# Patient Record
Sex: Female | Born: 1935
Health system: Southern US, Community
[De-identification: ages and names within clinical notes are randomized; demographics above are authoritative.]

## PROBLEM LIST (undated history)

## (undated) DIAGNOSIS — E039 Hypothyroidism, unspecified: Secondary | ICD-10-CM

## (undated) DIAGNOSIS — J189 Pneumonia, unspecified organism: Secondary | ICD-10-CM

## (undated) DIAGNOSIS — Z803 Family history of malignant neoplasm of breast: Secondary | ICD-10-CM

## (undated) DIAGNOSIS — Z8042 Family history of malignant neoplasm of prostate: Secondary | ICD-10-CM

## (undated) DIAGNOSIS — Z9289 Personal history of other medical treatment: Secondary | ICD-10-CM

## (undated) DIAGNOSIS — E669 Obesity, unspecified: Secondary | ICD-10-CM

## (undated) DIAGNOSIS — M1990 Unspecified osteoarthritis, unspecified site: Secondary | ICD-10-CM

## (undated) DIAGNOSIS — I10 Essential (primary) hypertension: Secondary | ICD-10-CM

## (undated) DIAGNOSIS — Z800 Family history of malignant neoplasm of digestive organs: Secondary | ICD-10-CM

## (undated) DIAGNOSIS — Z8051 Family history of malignant neoplasm of kidney: Secondary | ICD-10-CM

## (undated) DIAGNOSIS — E785 Hyperlipidemia, unspecified: Secondary | ICD-10-CM

## (undated) DIAGNOSIS — C250 Malignant neoplasm of head of pancreas: Secondary | ICD-10-CM

## (undated) HISTORY — PX: BREAST CYST EXCISION: SHX579

## (undated) HISTORY — DX: Unspecified osteoarthritis, unspecified site: M19.90

## (undated) HISTORY — DX: Family history of malignant neoplasm of prostate: Z80.42

## (undated) HISTORY — DX: Obesity, unspecified: E66.9

## (undated) HISTORY — PX: TONSILLECTOMY: SUR1361

## (undated) HISTORY — PX: INGUINAL HERNIA REPAIR: SUR1180

## (undated) HISTORY — DX: Family history of malignant neoplasm of digestive organs: Z80.0

## (undated) HISTORY — DX: Family history of malignant neoplasm of kidney: Z80.51

## (undated) HISTORY — DX: Family history of malignant neoplasm of breast: Z80.3

## (undated) HISTORY — PX: CYST EXCISION: SHX5701

## (undated) HISTORY — DX: Malignant neoplasm of head of pancreas: C25.0

## (undated) HISTORY — DX: Essential (primary) hypertension: I10

## (undated) HISTORY — PX: DILATION AND CURETTAGE OF UTERUS: SHX78

## (undated) HISTORY — DX: Hyperlipidemia, unspecified: E78.5

---

## 1977-10-11 DIAGNOSIS — Z9289 Personal history of other medical treatment: Secondary | ICD-10-CM

## 1977-10-11 HISTORY — DX: Personal history of other medical treatment: Z92.89

## 1977-10-11 HISTORY — PX: TOTAL ABDOMINAL HYSTERECTOMY: SHX209

## 1978-10-11 HISTORY — PX: REDUCTION MAMMAPLASTY: SUR839

## 2015-08-13 ENCOUNTER — Ambulatory Visit: Payer: PPO | Admitting: Podiatry

## 2015-09-03 ENCOUNTER — Ambulatory Visit: Payer: PPO | Admitting: Podiatry

## 2015-09-24 ENCOUNTER — Ambulatory Visit (INDEPENDENT_AMBULATORY_CARE_PROVIDER_SITE_OTHER): Payer: PPO | Admitting: Podiatry

## 2015-09-24 ENCOUNTER — Encounter: Payer: Self-pay | Admitting: Podiatry

## 2015-09-24 ENCOUNTER — Ambulatory Visit (INDEPENDENT_AMBULATORY_CARE_PROVIDER_SITE_OTHER): Payer: PPO

## 2015-09-24 VITALS — BP 131/72 | HR 76 | Resp 12

## 2015-09-24 DIAGNOSIS — M2040 Other hammer toe(s) (acquired), unspecified foot: Secondary | ICD-10-CM

## 2015-09-24 NOTE — Patient Instructions (Signed)
Hammer Toes Hammer toes is a condition in which one or more of your toes is permanently flexed. CAUSES  This happens when a muscle imbalance or abnormal bone length makes your small toes buckle. This causes the toe joint to contract and the strong cord-like bands that attach muscles to the bones (tendons) in your toes to shorten.  SIGNS AND SYMPTOMS  Common symptoms of flexible hammer toes include:   A buildup of skin cells (corns). Corns occur where boney bumps come in frequent contact with hard surfaces. For example, where your shoes press and rub.  Irritation.  Inflammation.  Pain.  Limited motion in your toes. DIAGNOSIS  Hammer toes are diagnosed through a physical exam of your toes. During the exam, your health care provider may try to reproduce your symptoms by manipulating your foot. Often, X-ray exams are done to determine the degree of deformity and to make sure that the cause is not a fracture.  TREATMENT  Hammer toes can be treated with corrective surgery. There are several types of surgical procedures that can treat hammer toes. The most common procedures include:  Arthroplasty--A portion of the joint is surgically removed and your toe is straightened. The gap fills in with fibrous tissue. This procedure helps treat pain and deformity and helps restore function.  Fusion--Cartilage between the two bones of the affected joint is taken out and the bones fuse together into one longer bone. This helps keep your toe stable and reduces pain but leaves your toe stiff, yet straight.  Implantation--A portion of your bone is removed and replaced with an implant to restore motion.  Flexor tendon transfers--This procedure repositions the tendons that curl the toes down (flexor tendons). This may be done to release the deforming force that causes your toe to buckle. Several of these procedures require fixing your toe with a pin that is visible at the tip of your toe. The pin keeps the toe  straight during healing. Your health care provider will remove the pin usually within 4-8 weeks after the procedure.    This information is not intended to replace advice given to you by your health care provider. Make sure you discuss any questions you have with your health care provider.   Document Released: 09/24/2000 Document Revised: 10/02/2013 Document Reviewed: 06/04/2013 Elsevier Interactive Patient Education 2016 [employer]  

## 2015-09-24 NOTE — Progress Notes (Signed)
   Subjective:    Patient ID: Lauren Richard, female    DOB: 1936/03/28, 79 y.o.   MRN: [id number]  HPI    This patient presents today primarily because of a painful second left toe further approximately 1 year aggravated with shoe wearing and walking. Patient has applied pads around the toe to relieve the symptoms. If patient wears a very loose shoe she can tolerate the shoes. The symptoms are progressively more uncomfortable in or around the second left toe at the level of the distal interphalangeal joint. Patient notices a area of firm tissue in that area that has not increased in size. Also patient is complaining of painful fifth toes/corns when wearing shoes and is applied padding around this area which does reduce some of the symptoms. She would like to have these areas evaluated as well    Review of Systems  Cardiovascular: Positive for leg swelling.       Objective:   Physical Exam  Orientated 3  Vascular: No peripheral edema bilaterally DP pulses 4/4 bilaterally DP pulses 2/4 bilaterally Capillary reflex immediate bilaterally  Neurological: Sensation to 10 g monofilament wire intact 5/5 bilaterally Vibratory sensation intact bilaterally Ankle reflex equal and reactive bilaterally  Dermatological: No open skin lesions bilaterally Corns lateral fifth toes bilaterally  Musculoskeletal: HAV deformities bilaterally Contracture of distal interphalangeal joint second left with palpable firm area on the lateral aspect of the distal interphalangeal joint left. Hammertoe second right Varus rotation toes 3-5 left Varus rotation fifth toe right   X-ray examination weightbearing right foot dated 09/24/2015  Intact bony structures without fracture and/or dislocation HAV Hammertoe second Varus rotation fifth toe Posterior and inferior calcaneal spurs Bone density appears adequate  Radiographic impression: No acute bony abnormality noted in the right foot  X-ray  examination weightbearing left foot  Intact bony structure without fracture and/or dislocation Contracture distal interphalangeal joint second toe Small area of calcification on the lateral aspect of the middle phalanx, second toe Varus rotation toes 3-5 Posterior and inferior calcaneal spurs  Radiographic impression: No acute bony abnormality noted in the left foot Mallet toe second left with area of calcification noted on the lateral aspect of the middle phalanx Varus rotation toes 2-5 left    Assessment & Plan:    Assessment: Satisfactory neurovascular status Mallet toe second left Small area of calcification distal interphalangeal joint second left Varus rotated/hammertoes fifth bilaterally with symptoms  Plan: Today review the results of the examination with patient today and made aware that her treatment options included shoe modification, padding or possible surgical intervention. At this time patient is interested in surgical intervention. I referring patient to Dr. Earleen Newport for further evaluation and treatment as patient and Dr. Tami Lin agree upon

## 2015-10-20 ENCOUNTER — Ambulatory Visit: Payer: PPO | Admitting: Podiatry

## 2015-11-03 ENCOUNTER — Ambulatory Visit (INDEPENDENT_AMBULATORY_CARE_PROVIDER_SITE_OTHER): Payer: PPO | Admitting: Podiatry

## 2015-11-03 ENCOUNTER — Encounter: Payer: Self-pay | Admitting: Podiatry

## 2015-11-03 VITALS — BP 142/66 | HR 88 | Resp 18

## 2015-11-03 DIAGNOSIS — M2040 Other hammer toe(s) (acquired), unspecified foot: Secondary | ICD-10-CM

## 2015-11-03 NOTE — Patient Instructions (Signed)

## 2015-11-03 NOTE — Progress Notes (Signed)
Patient ID: Lauren Richard, female   DOB: 06/18/1936, 80 y.o.   MRN: [id number]  Subjective: 80 year old female presents the office today for surgical consultation due to continued pain to her left second and fifth toe and to lesser degree her right fifth toe. She said that she is unable to wear shoes on her left foot due to the pain. She has tried shoe gear modifications, padding offloading the any relief symptoms and this time she is requesting surgical intervention to help decrease her pain and deformity to the left foot. Denies any systemic complaints such as fevers, chills, nausea, vomiting. No acute changes since last appointment, and no other complaints at this time.   Objective: AAO x3, NAD DP/PT pulses palpable bilaterally 2/4, CRT less than 3 seconds Protective sensation intact with Simms Weinstein monofilament There is hammertoe contracture of the left second DIPJ and there is adductovarus left fifth digit. There is bony prominences present and tenderness upon these areas. There is no edema, erythema, increase in warmth. To lesser degree there is also adductovarus of the fifth digit on the right foot however not as symptomatic. Tenderness to left foot greater than the right. No other areas of tenderness to bilateral lower extremities. No areas of pinpoint bony tenderness or pain with vibratory sensation. MMT 5/5, ROM WNL. No edema, erythema, increase in warmth to bilateral lower extremities.  No open lesions or pre-ulcerative lesions.  No pain with calf compression, swelling, warmth, erythema  Assessment: 80 year old female left second and fifth digits symptomatic hammertoes  Plan: -All treatment options discussed with the patient including all alternatives, risks, complications.  -Previous x-rays were reviewed with the patient. I discussed both conservative and surgical treatment options. At this time showed to proceed with surgical intervention as she is unable to wear shoes due to  the continued pain. -Discussed DIPJ arthroplasty of the left second toe and PIPJ arthroplasty of the left fifth toe. We'll hold off on right foot surgery at this time it is not as symptomatic -The incision placement as well as the postoperative course was discussed with the patient. I discussed risks of the surgery which include, but not limited to, infection, bleeding, pain, swelling, need for further surgery, delayed or nonhealing, painful or ugly scar, numbness or sensation changes, over/under correction, recurrence, transfer lesions, further deformity, hardware failure, DVT/PE, loss of toe/foot. Patient understands these risks and wishes to proceed with surgery. The surgical consent was reviewed with the patient all 3 pages were signed. No promises or guarantees were given to the outcome of the procedure. All questions were answered to the best of my ability. Before the surgery the patient was encouraged to call the office if there is any further questions. The surgery will be performed at the [facility] on an outpatient basis.  Celesta Gentile, DPM

## 2015-11-06 ENCOUNTER — Telehealth: Payer: Self-pay | Admitting: *Deleted

## 2015-11-06 NOTE — Telephone Encounter (Signed)
"  Someone was supposed to call me with my surgery date.  I gave it a day so I thought I better call."  He does surgeries on Wednesday.  What Wednesday do you have available?  My nephew will be the one bringing me and he works every other Wednesday."  He can do it on February 8, 15 or 22.  "Let me check with him and give you a call back."  That will be fine.

## 2015-11-06 NOTE — Telephone Encounter (Signed)
"  I spoke to my son.  Let's schedule my surgery for 11/19/2015."  Okay, I'll get it scheduled.  You should hear from the surgical center a day or two prior to surgery date.  They will give you the arrival time.  "You can't give me a time?  My son has to take his wife to work.  She has to be there at 8 am.  He'll be coming from [place]."  You will be his third case so you may be okay.  Surgical will let you know.  "Okay, I'd like an arrival time of about 9 am."

## 2015-11-18 ENCOUNTER — Telehealth: Payer: Self-pay | Admitting: *Deleted

## 2015-11-18 NOTE — Telephone Encounter (Signed)
I called and informed patient that I calling in regards to her insurance.  I had called [employer] and the automated system stated that authorization was not needed for surgery.  I received a call from the surgical center stating that it needed authorization.  I started the process with [employer].  I don't know if we will get authorization in time for surgery.  It may expedite it if you called.  "Okay, I'll call because I have already made arrangements.  What's the phone number I need to call?"   You need to call [phone].  "Okay, I'll call right now."  I'm going to call as well.

## 2015-11-18 NOTE — Telephone Encounter (Signed)
"  I'm calling you back about Ms. Lauren Richard.  Just wanted to advise you that surgery for tomorrow has now been approved.  You should be receiving a letter shortly.  If you have any questions give [place] a call."  I called and left a message for [name] at the surgical center.  It has been approved.  They are supposed to be sending over a letter.  I'll fax it to you as soon as I get it.  I called and informed patient that surgery has been authorized.

## 2015-11-19 ENCOUNTER — Telehealth: Payer: Self-pay | Admitting: *Deleted

## 2015-11-19 ENCOUNTER — Encounter: Payer: Self-pay | Admitting: Podiatry

## 2015-11-19 DIAGNOSIS — I10 Essential (primary) hypertension: Secondary | ICD-10-CM | POA: Diagnosis not present

## 2015-11-19 DIAGNOSIS — M2042 Other hammer toe(s) (acquired), left foot: Secondary | ICD-10-CM | POA: Diagnosis not present

## 2015-11-19 NOTE — Telephone Encounter (Signed)
Thank you :)

## 2015-11-19 NOTE — Telephone Encounter (Addendum)
Pt's nephew called states [facility] needs a confirmation of the narcotic pt was prescribed by Dr. Jacqualyn Posey after surgery.  I SPOKE WITH PHARMACY [facility] [phone], and was informed Vicodin does not come in 5/325, comes only in 5/300, if change to Norco to get 5/325 will need pt to come back to [place] to pick up written rx.  Dr. Jacqualyn Posey states okay to change to Vicodin 5/300. 12/05/2015-PT ASKED IF SHE NEEDED TO WEAR surgical shoe to bed.  I told her no, but she still need to wear to walk.

## 2015-11-26 DIAGNOSIS — Z1231 Encounter for screening mammogram for malignant neoplasm of breast: Secondary | ICD-10-CM | POA: Diagnosis not present

## 2015-11-28 ENCOUNTER — Ambulatory Visit (INDEPENDENT_AMBULATORY_CARE_PROVIDER_SITE_OTHER): Payer: PPO

## 2015-11-28 ENCOUNTER — Encounter: Payer: Self-pay | Admitting: Podiatry

## 2015-11-28 ENCOUNTER — Ambulatory Visit (INDEPENDENT_AMBULATORY_CARE_PROVIDER_SITE_OTHER): Payer: PPO | Admitting: Podiatry

## 2015-11-28 VITALS — BP 117/64 | HR 92 | Resp 18

## 2015-11-28 DIAGNOSIS — Z9889 Other specified postprocedural states: Secondary | ICD-10-CM | POA: Diagnosis not present

## 2015-11-28 DIAGNOSIS — M2040 Other hammer toe(s) (acquired), unspecified foot: Secondary | ICD-10-CM | POA: Diagnosis not present

## 2015-12-03 DIAGNOSIS — Z9889 Other specified postprocedural states: Secondary | ICD-10-CM | POA: Insufficient documentation

## 2015-12-03 NOTE — Progress Notes (Signed)
Patient ID: Lauren Richard, female   DOB: 1935/12/05, 80 y.o.   MRN: [id number]  Subjective: Lauren Richard is a 80 y.o. is seen today in office s/p left 2nd and 5th hammertoe repair preformed on 11/19/15. She states that she is doing well she is not taking pain medication. She is continuing the CAM boot. She has been ambulating as tolerated. Denies any systemic complaints such as fevers, chills, nausea, vomiting. No calf pain, chest pain, shortness of breath.   Objective: General: No acute distress, AAOx3  DP/PT pulses palpable 2/4, CRT < 3 sec to all digits.  Protective sensation intact. Motor function intact.  Left foot: Incision is well coapted without any evidence of dehiscence with sutures intact. There is no surrounding erythema, ascending cellulitis, fluctuance, crepitus, malodor, drainage/purulence. There is minimal edema around the surgical site. There is minimal discomfort along the surgical site.  No other areas of tenderness to bilateral lower extremities.  No other open lesions or pre-ulcerative lesions.  No pain with calf compression, swelling, warmth, erythema.   Assessment and Plan:  Status post left 2nd and 5th hammertoe repair, doing well with no complications   -Treatment options discussed including all alternatives, risks, and complications -X-rays were obtained and reviewed with the patient. Status post arthroplasty of the second DIPJ and arthroplasty of the fifth PIPJ. There is slight deviation to distal fashion second toe the signs were discussed with the patient. -Antibiotic ointment was placed over the incision followed by dried dressing. Keep dressing clean, dry, intact. -Dispensed surgical shoe. -Ice/elevation -Pain medication as needed. -Monitor for any clinical signs or symptoms of infection and DVT/PE and directed to call the office immediately should any occur or go to the ER. -Follow-up in 1 week for likely suture removal or sooner if any problems arise. In the  meantime, encouraged to call the office with any questions, concerns, change in symptoms.   Celesta Gentile, DPM

## 2015-12-05 ENCOUNTER — Encounter: Payer: Self-pay | Admitting: Podiatry

## 2015-12-05 ENCOUNTER — Ambulatory Visit (INDEPENDENT_AMBULATORY_CARE_PROVIDER_SITE_OTHER): Payer: PPO | Admitting: Podiatry

## 2015-12-05 VITALS — BP 109/63 | HR 77 | Resp 18

## 2015-12-05 DIAGNOSIS — M2040 Other hammer toe(s) (acquired), unspecified foot: Secondary | ICD-10-CM

## 2015-12-05 DIAGNOSIS — Z9889 Other specified postprocedural states: Secondary | ICD-10-CM

## 2015-12-11 NOTE — Progress Notes (Signed)
Patient ID: Lauren Richard, female   DOB: Sep 25, 1936, 80 y.o.   MRN: [id number]  Subjective: Lauren Richard is a 80 y.o. is seen today in office s/p left 2nd and 5th hammertoe repair preformed on 11/19/15. She states that she is doing well she is not taking pain medication. She is continuing in the surgical shoe. She has been ambulating as tolerated. Denies any systemic complaints such as fevers, chills, nausea, vomiting. No calf pain, chest pain, shortness of breath.   Objective: General: No acute distress, AAOx3  DP/PT pulses palpable 2/4, CRT < 3 sec to all digits.  Protective sensation intact. Motor function intact.  Left foot: Incision is well coapted without any evidence of dehiscence with sutures intact. There is no surrounding erythema, ascending cellulitis, fluctuance, crepitus, malodor, drainage/purulence. There is minimal edema around the surgical site. There is no significant discomfort along the surgical site.  No other areas of tenderness to bilateral lower extremities.  No other open lesions or pre-ulcerative lesions.  No pain with calf compression, swelling, warmth, erythema.   Assessment and Plan:  Status post left 2nd and 5th hammertoe repair, doing well with no complications   -Treatment options discussed including all alternatives, risks, and complications -Sutures removed today without complications. Continue antibiotic ointment dressing changes daily. -Inserted transition to regular shoe as tolerated over the next couple weeks. -Ice and elevation. -Monitor for any clinical signs or symptoms of infection and DVT/PE and directed to call the office immediately should any occur or go to the ER. -Follow-up in 1 week for likely suture removal or sooner if any problems arise. In the meantime, encouraged to call the office with any questions, concerns, change in symptoms.   Celesta Gentile, DPM

## 2015-12-17 ENCOUNTER — Telehealth: Payer: Self-pay | Admitting: *Deleted

## 2015-12-17 NOTE — Telephone Encounter (Signed)
Pt asked if she could shower, DOS 11/19/2015, and she doesn't have stitches.  I told pt she could get shower wet, no bathing or soaking.  Pt states understanding.

## 2015-12-30 NOTE — Progress Notes (Unsigned)
Patient ID: Lauren Richard, female   DOB: 03-15-1936, 80 y.o.   MRN: [id number] Dr. Jacqualyn Posey performed a left foot second and fifth digit hammertoe repair on 11/19/2015 at the [facility].

## 2016-01-05 ENCOUNTER — Encounter: Payer: PPO | Admitting: Podiatry

## 2016-01-12 ENCOUNTER — Encounter: Payer: Self-pay | Admitting: Podiatry

## 2016-01-12 ENCOUNTER — Ambulatory Visit (INDEPENDENT_AMBULATORY_CARE_PROVIDER_SITE_OTHER): Payer: PPO | Admitting: Podiatry

## 2016-01-12 ENCOUNTER — Ambulatory Visit (INDEPENDENT_AMBULATORY_CARE_PROVIDER_SITE_OTHER): Payer: PPO

## 2016-01-12 VITALS — BP 121/60 | HR 94 | Resp 18

## 2016-01-12 DIAGNOSIS — Z9889 Other specified postprocedural states: Secondary | ICD-10-CM | POA: Diagnosis not present

## 2016-01-12 DIAGNOSIS — M2040 Other hammer toe(s) (acquired), unspecified foot: Secondary | ICD-10-CM | POA: Diagnosis not present

## 2016-01-13 NOTE — Progress Notes (Signed)
Patient ID: Lauren Richard, female   DOB: Sep 29, 1936, 80 y.o.   MRN: [id number]  Subjective: Lauren Richard is a 80 y.o. is seen today in office s/p left 2nd and 5th hammertoe repair preformed on 11/19/15. She states that she is doing well she is not taking pain medication. She gets occasional swelling to the toes however does appear to be improving. She has not yet tried to go to a regular shoe. She is continue the surgical shoe. She is asking for smaller surgical shoe to wear until she did back into a regular shoe. Denies any systemic complaints such as fevers, chills, nausea, vomiting. No calf pain, chest pain, shortness of breath.   Objective: General: No acute distress, AAOx3  DP/PT pulses palpable 2/4, CRT < 3 sec to all digits.  Protective sensation intact. Motor function intact.  Left foot: Incision is well coapted without any evidence of and a scar has well formed.There is minimal edema to the surgical toes, which appears to be improved, without any erythema, ascending cellulitis, fluctuance, crepitus, malodor, drainage/purulence. There is  currently no tenderness up on surgical sites. Toes sit in rectus position.  No other areas of tenderness to bilateral lower extremities.  No other open lesions or pre-ulcerative lesions.  No pain with calf compression, swelling, warmth, erythema.   Assessment and Plan:  Status post left 2nd and 5th hammertoe repair, doing well with no complications   -Treatment options discussed including all alternatives, risks, and complications -Recommended cocoa butter or vitamin E cream over the incision daily. -I showed her today how to tape the toes to help with swelling. -Dispensed a small surgical shoe at her request. This appeared be fitting better. She can transition to regular shoe as tolerated. Gradually increase activities. -Ice and elevation.  -Monitor for any clinical signs or symptoms of infection and DVT/PE and directed to call the office immediately  should any occur or go to the ER. -Follow-up in 4-6 weeks or sooner if any problems arise. In the meantime, encouraged to call the office with any questions, concerns, change in symptoms.   Celesta Gentile, DPM

## 2016-01-14 DIAGNOSIS — M1711 Unilateral primary osteoarthritis, right knee: Secondary | ICD-10-CM | POA: Diagnosis not present

## 2016-01-14 DIAGNOSIS — R200 Anesthesia of skin: Secondary | ICD-10-CM | POA: Diagnosis not present

## 2016-01-14 DIAGNOSIS — M1712 Unilateral primary osteoarthritis, left knee: Secondary | ICD-10-CM | POA: Diagnosis not present

## 2016-01-14 DIAGNOSIS — M19241 Secondary osteoarthritis, right hand: Secondary | ICD-10-CM | POA: Diagnosis not present

## 2016-01-16 ENCOUNTER — Telehealth: Payer: Self-pay | Admitting: *Deleted

## 2016-01-16 NOTE — Telephone Encounter (Signed)
Once she goes back in to a regular shoe and can do her daily activities in a regular shoe she can go to the gym

## 2016-01-16 NOTE — Telephone Encounter (Addendum)
Pt states she had surgery with Dr. Jacqualyn Posey 11/2015 and would like to know if she could go back to the gym and also swim.  01/19/2016-Informed pt of Dr. Leigh Aurora orders.

## 2016-01-22 DIAGNOSIS — I10 Essential (primary) hypertension: Secondary | ICD-10-CM | POA: Diagnosis not present

## 2016-02-16 ENCOUNTER — Ambulatory Visit (INDEPENDENT_AMBULATORY_CARE_PROVIDER_SITE_OTHER): Payer: PPO | Admitting: Podiatry

## 2016-02-16 ENCOUNTER — Encounter: Payer: Self-pay | Admitting: Podiatry

## 2016-02-16 ENCOUNTER — Ambulatory Visit (INDEPENDENT_AMBULATORY_CARE_PROVIDER_SITE_OTHER): Payer: PPO

## 2016-02-16 VITALS — BP 121/60 | HR 87 | Resp 18

## 2016-02-16 DIAGNOSIS — Z9889 Other specified postprocedural states: Secondary | ICD-10-CM | POA: Diagnosis not present

## 2016-02-16 DIAGNOSIS — M2040 Other hammer toe(s) (acquired), unspecified foot: Secondary | ICD-10-CM

## 2016-02-16 NOTE — Progress Notes (Signed)
Patient ID: NEILE NUDING, female   DOB: October 03, 1936, 80 y.o.   MRN: [id number]  Subjective: DELTHA VOELZ is a 80 y.o. is seen today in office s/p left 2nd and 5th hammertoe repair preformed on 11/19/15. She states that she's not having any pain to the toes. She gets some occasional swelling which she's been taping her toes. She has to get up out of the gym and exercise although she does wear surgical shoe at times. She states that she can wear regular shoe the any pain however. Denies any systemic complaints such as fevers, chills, nausea, vomiting. No calf pain, chest pain, shortness of breath.   Objective: General: No acute distress, AAOx3  DP/PT pulses palpable 2/4, CRT < 3 sec to all digits.  Protective sensation intact. Motor function intact.  Left foot: Incision is well coapted without any evidence of dehiscence and scars are well formed. There is no surrounding erythema, ascending cellulitis, fluctuance, crepitus, malodor, drainage/purulence. There is trace edema around the surgical site to the 2nd toe but no significant swelling the fifth toe. There is no pain along the surgical site. Toes in rectus position No other areas of tenderness to bilateral lower extremities.  No other open lesions or pre-ulcerative lesions.  No pain with calf compression, swelling, warmth, erythema.   Assessment and Plan:  Status post left foot hammertoe repair doing well with no complications   -Treatment options discussed including all alternatives, risks, and complications -X-rays were obtained and reviewed with the patient. There is no evidence of acute fracture or stress fracture. Status post arthroplasty of the DIPJ of the second toe at PIPJ arthroplasty fifth toe. -Continue to the toe to help with swelling as needed. -She can continue with a regular shoe gear at all times. Activity as tolerated. -Ice/elevation -Pain medication as needed. -Monitor for any clinical signs or symptoms of infection and  DVT/PE and directed to call the office immediately should any occur or go to the ER. -Follow-up in 6 weeks if symptoms continue or sooner if any problems arise. In the meantime, encouraged to call the office with any questions, concerns, change in symptoms.   Celesta Gentile, DPM

## 2016-03-29 ENCOUNTER — Ambulatory Visit: Payer: PPO | Admitting: Podiatry

## 2016-04-05 DIAGNOSIS — Z5181 Encounter for therapeutic drug level monitoring: Secondary | ICD-10-CM | POA: Diagnosis not present

## 2016-04-07 DIAGNOSIS — M1712 Unilateral primary osteoarthritis, left knee: Secondary | ICD-10-CM | POA: Diagnosis not present

## 2016-04-14 DIAGNOSIS — M1712 Unilateral primary osteoarthritis, left knee: Secondary | ICD-10-CM | POA: Diagnosis not present

## 2016-04-21 DIAGNOSIS — M1712 Unilateral primary osteoarthritis, left knee: Secondary | ICD-10-CM | POA: Diagnosis not present

## 2016-05-13 DIAGNOSIS — M1711 Unilateral primary osteoarthritis, right knee: Secondary | ICD-10-CM | POA: Diagnosis not present

## 2016-06-16 DIAGNOSIS — M19241 Secondary osteoarthritis, right hand: Secondary | ICD-10-CM | POA: Diagnosis not present

## 2016-06-16 DIAGNOSIS — M1712 Unilateral primary osteoarthritis, left knee: Secondary | ICD-10-CM | POA: Diagnosis not present

## 2016-06-16 DIAGNOSIS — R5381 Other malaise: Secondary | ICD-10-CM | POA: Diagnosis not present

## 2016-06-16 DIAGNOSIS — M25462 Effusion, left knee: Secondary | ICD-10-CM | POA: Diagnosis not present

## 2016-06-16 DIAGNOSIS — M19242 Secondary osteoarthritis, left hand: Secondary | ICD-10-CM | POA: Diagnosis not present

## 2016-06-16 DIAGNOSIS — M2550 Pain in unspecified joint: Secondary | ICD-10-CM | POA: Diagnosis not present

## 2016-06-16 DIAGNOSIS — Z79899 Other long term (current) drug therapy: Secondary | ICD-10-CM | POA: Diagnosis not present

## 2016-06-16 DIAGNOSIS — M25562 Pain in left knee: Secondary | ICD-10-CM | POA: Diagnosis not present

## 2016-06-16 DIAGNOSIS — M5137 Other intervertebral disc degeneration, lumbosacral region: Secondary | ICD-10-CM | POA: Diagnosis not present

## 2016-07-26 ENCOUNTER — Ambulatory Visit (INDEPENDENT_AMBULATORY_CARE_PROVIDER_SITE_OTHER): Payer: PPO | Admitting: Orthopaedic Surgery

## 2016-07-26 DIAGNOSIS — M25562 Pain in left knee: Secondary | ICD-10-CM

## 2016-07-26 DIAGNOSIS — M25462 Effusion, left knee: Secondary | ICD-10-CM | POA: Diagnosis not present

## 2016-08-05 DIAGNOSIS — Z0000 Encounter for general adult medical examination without abnormal findings: Secondary | ICD-10-CM | POA: Diagnosis not present

## 2016-08-05 DIAGNOSIS — Z23 Encounter for immunization: Secondary | ICD-10-CM | POA: Diagnosis not present

## 2016-08-05 DIAGNOSIS — Z79899 Other long term (current) drug therapy: Secondary | ICD-10-CM | POA: Diagnosis not present

## 2016-08-05 DIAGNOSIS — I10 Essential (primary) hypertension: Secondary | ICD-10-CM | POA: Diagnosis not present

## 2016-09-06 ENCOUNTER — Telehealth (INDEPENDENT_AMBULATORY_CARE_PROVIDER_SITE_OTHER): Payer: Self-pay | Admitting: Physical Medicine and Rehabilitation

## 2016-09-06 NOTE — Telephone Encounter (Signed)
Patient requesting refill of pain rx. Please call when ready for pick up.

## 2016-09-07 ENCOUNTER — Other Ambulatory Visit: Payer: Self-pay | Admitting: Rheumatology

## 2016-09-07 NOTE — Telephone Encounter (Signed)
PW has not seen this patient and have her call her PCP for meds.

## 2016-09-07 NOTE — Telephone Encounter (Signed)
Patient needs a refill of tramadol from Dr. Estanislado Pandy

## 2016-09-07 NOTE — Telephone Encounter (Signed)
06/16/16 last visit Next visit 10/01/16 UDS 04/07/16 narcotic agreement 02/17/16 ok to refill Tramadol ?

## 2016-09-08 MED ORDER — TRAMADOL HCL 50 MG PO TABS: 50.0000 mg | ORAL_TABLET | Freq: Two times a day (BID) | ORAL | 2 refills | Status: DC | PRN

## 2016-09-08 NOTE — Telephone Encounter (Signed)
Please call patient about Tramadol refill. I explained to patient the message had been sent and we were awaiting reply before the refill could be sent.

## 2016-09-08 NOTE — Telephone Encounter (Signed)
ok to refill Tramadol

## 2016-09-08 NOTE — Telephone Encounter (Signed)
I pended it for you now, sorry, I did not previously do this.

## 2016-09-08 NOTE — Telephone Encounter (Signed)
Patient called again about refill. Please send to [facility] on [address].

## 2016-09-09 ENCOUNTER — Telehealth (INDEPENDENT_AMBULATORY_CARE_PROVIDER_SITE_OTHER): Payer: Self-pay | Admitting: Rheumatology

## 2016-09-10 NOTE — Telephone Encounter (Signed)
I spoke to [facility] about patient yesterday, patient called stated her Tramadol did not go through on the fax, so I did refax it yesterday.

## 2016-09-27 ENCOUNTER — Ambulatory Visit (INDEPENDENT_AMBULATORY_CARE_PROVIDER_SITE_OTHER): Payer: PPO | Admitting: Orthopaedic Surgery

## 2016-10-01 ENCOUNTER — Ambulatory Visit: Payer: PPO | Admitting: Rheumatology

## 2016-10-14 ENCOUNTER — Telehealth (INDEPENDENT_AMBULATORY_CARE_PROVIDER_SITE_OTHER): Payer: Self-pay | Admitting: Orthopedic Surgery

## 2016-10-14 ENCOUNTER — Encounter: Payer: Self-pay | Admitting: Rheumatology

## 2016-10-14 ENCOUNTER — Ambulatory Visit (INDEPENDENT_AMBULATORY_CARE_PROVIDER_SITE_OTHER): Payer: PPO | Admitting: Rheumatology

## 2016-10-14 VITALS — BP 140/76 | HR 82 | Resp 16 | Ht 67.0 in | Wt 207.0 lb

## 2016-10-14 DIAGNOSIS — M19041 Primary osteoarthritis, right hand: Secondary | ICD-10-CM

## 2016-10-14 DIAGNOSIS — M47812 Spondylosis without myelopathy or radiculopathy, cervical region: Secondary | ICD-10-CM

## 2016-10-14 DIAGNOSIS — M1A0620 Idiopathic chronic gout, left knee, without tophus (tophi): Secondary | ICD-10-CM | POA: Diagnosis not present

## 2016-10-14 DIAGNOSIS — M19042 Primary osteoarthritis, left hand: Secondary | ICD-10-CM | POA: Diagnosis not present

## 2016-10-14 DIAGNOSIS — G8929 Other chronic pain: Secondary | ICD-10-CM | POA: Diagnosis not present

## 2016-10-14 DIAGNOSIS — M25561 Pain in right knee: Secondary | ICD-10-CM

## 2016-10-14 DIAGNOSIS — R700 Elevated erythrocyte sedimentation rate: Secondary | ICD-10-CM | POA: Diagnosis not present

## 2016-10-14 DIAGNOSIS — M25562 Pain in left knee: Secondary | ICD-10-CM

## 2016-10-14 DIAGNOSIS — M5030 Other cervical disc degeneration, unspecified cervical region: Secondary | ICD-10-CM | POA: Diagnosis not present

## 2016-10-14 DIAGNOSIS — M170 Bilateral primary osteoarthritis of knee: Secondary | ICD-10-CM

## 2016-10-14 MED ORDER — COLCHICINE 0.6 MG PO TABS
0.6000 mg | ORAL_TABLET | Freq: Every day | ORAL | 2 refills | Status: DC
Start: 2016-10-14 — End: 2017-03-03

## 2016-10-14 MED ORDER — ALLOPURINOL 100 MG PO TABS: 100.0000 mg | ORAL_TABLET | Freq: Every day | ORAL | 2 refills | Status: DC

## 2016-10-14 NOTE — Progress Notes (Signed)
Office Visit Note  Patient: Lauren Richard             Date of Birth: Jul 13, 1936           MRN: [id number]             PCP: Maximino Greenland, MD Referring: Glendale Chard, MD Visit Date: 10/14/2016 Occupation: @GUAROCC @    Subjective:  Pain of the Left Knee and Follow-up (legs feel stiff sometimes )   History of Present Illness: Lauren Richard is a 81 y.o. female  Last seen 06/16/2016 Patient is doing relatively well with her OA of the knee joint. She states "I'm not having any pain in my left knee, it's just sore at times". Some of her pain is to the back of the knee especially when she walks up and down the stairs. She is doing well after getting 66 mL's of synovial fluid removed from the left knee on 06/16/2016 visit It has not recurred.  She is also seeing Dr. Durward Fortes for her knees. They have discussed that knee replacement is an option if and when the patient wishes to move forward with that. Dr. Durward Fortes has left the decision to the patient. I've also reinforced the same thing to the patient. Currently she rates her discomfort as about 3-4 on a scale of 0-10. 9 in 11/23/2016, patient will be 81 years old. Currently she is quite healthy and from that point of view may be eligible for surgery but if her knees are doing well it may be best to hold off on surgery.  She recently had labs done at her PCPs office. Were waiting those updated labs. We do have labs that were ordered at her last visit.   Activities of Daily Living:  Patient reports morning stiffness for 15 minutes.   Patient Denies nocturnal pain.  Difficulty dressing/grooming: Denies Difficulty climbing stairs: Reports Difficulty getting out of chair: Reports Difficulty using hands for taps, buttons, cutlery, and/or writing: Reports   Review of Systems  Constitutional: Negative for fatigue.  HENT: Negative for mouth sores and mouth dryness.   Eyes: Negative for dryness.  Respiratory: Negative for  shortness of breath.   Gastrointestinal: Negative for constipation and diarrhea.  Musculoskeletal: Negative for myalgias and myalgias.  Skin: Negative for sensitivity to sunlight.  Psychiatric/Behavioral: Negative for decreased concentration and sleep disturbance.    PMFS History:  Patient Active Problem List   Diagnosis Date Noted  . Status post left foot surgery 12/03/2015  . Hammertoe 11/03/2015    Past Medical History:  Diagnosis Date  . Arthritis   . Hyperlipidemia   . Hypertension     No family history on file. No past surgical history on file. Social History   Social History Narrative  . No narrative on file     Objective: Vital Signs: BP 140/76   Pulse 82   Resp 16   Ht 5\' 7"  (1.702 m)   Wt 207 lb (93.9 kg)   BMI 32.42 kg/m    Physical Exam  Constitutional: She is oriented to person, place, and time. She appears well-developed and well-nourished.  HENT:  Head: Normocephalic and atraumatic.  Eyes: EOM are normal. Pupils are equal, round, and reactive to light.  Cardiovascular: Normal rate, regular rhythm and normal heart sounds.  Exam reveals no gallop and no friction rub.   No murmur heard. Pulmonary/Chest: Effort normal and breath sounds normal. She has no wheezes. She has no rales.  Abdominal: Soft. Bowel sounds  are normal. She exhibits no distension. There is no tenderness. There is no guarding. No hernia.  Musculoskeletal: Normal range of motion. She exhibits no edema, tenderness or deformity.  Lymphadenopathy:    She has no cervical adenopathy.  Neurological: She is alert and oriented to person, place, and time. Coordination normal.  Skin: Skin is warm and dry. Capillary refill takes less than 2 seconds. No rash noted.  Psychiatric: She has a normal mood and affect. Her behavior is normal.     Musculoskeletal Exam:  Full range of motion of all joints Grip strength is equal and strong bilaterally Fibromyalgia tender points are all absent  CDAI  Exam: CDAI Homunculus Exam:   Tenderness:  LLE: tibiofemoral  Joint Counts:  CDAI Tender Joint count: 1 CDAI Swollen Joint count: 0  Global Assessments:  Patient Global Assessment: 3 Provider Global Assessment: 3  CDAI Calculated Score: 7  Patient's left knee pain is rated about 3-4 on a scale of 0-10. There is no synovitis, effusion, warmth. She had fluid in her left knee at last visit in September but has done very well since then. She has ongoing left knee pain intermittently. Her main complaint is that it gets stiff at times.  Investigation: No additional findings. See labs from 06/16/2016. See labs for full details. They are located in [facility]. CMP with GFR is normal except for slightly low potassium at 3.3  Nonfasting glucose is elevated at 123 Creatinine is elevated at 1.20 GFR is low at 50.  CBC with differential is within normal limits except for slightly elevated WBCs at 11.7  Sedimentation rate is elevated at 71. Uric acid is 4.8 Rheumatoid factor is 17  Synovial fluid shows: Total nucleated cell count at 13,780 83% neutrophils  CCP is negative at less than 16  Synovial fluid culture showed no growth at 1 day and moderate WBC present with p.m. and and mononuclear Cells.  Imaging: No results found.  Speciality Comments: No specialty comments available.    Procedures:  No procedures performed Allergies: Patient has no known allergies.   Assessment / Plan:     Visit Diagnoses: Primary osteoarthritis of both knees  Bilateral chronic knee pain  Primary osteoarthritis of both hands  DJD (degenerative joint disease), cervical  Chronic gout of left knee, unspecified cause - Plan: CBC with Differential/Platelet, COMPLETE METABOLIC PANEL WITH GFR, Uric acid, Sedimentation rate  Elevated sed rate - Plan: Sedimentation rate   Plan: #1: Patient has had recent blood work done at her PCPs office. She does not want to get any blood work done today. We will  ask for the labs from the PCPs office to be sent to our office.  #2: Patient has a history of osteoarthritis of the hands as well as the knees.  #3: History of left knee effusion. I aspirated last visit. Doing well this visit. No warmth no effusion occasional tenderness.  #4: Uric acid was normal on the September 2017 labs with mild elevation of rheumatoid factor, negative CCP.  #5: Patient sedimentation rate was elevated at 71 and probably due to her gout flare. Please see Dr. Arlean Hopping message associated with 06/16/2016 labs in [facility]. Reviewing those labs shows that she had MSU crystals and she needed to come in to start allopurinol and colchicine.  #6: I will start her on colchicine now and have her come back in one month so we can start her on allopurinol.  #7: Patient will see Dr. Durward Fortes on 10/25/2016. During that visit, I  sent a message to Mr. Petrarca and Dr. Durward Fortes that patient will need labs drawn. She will also need to be told to start allopurinol. I plan to do 100 mg daily. I prescribed 30 day supply with 2 refills. I plan to start this medication on 10/25/2016..  #8: After the patient left the office, I called her house and instructed her that I want her to start this today and she already has picked up the medication. That she will need to do labs upon her return visit and all of those labs have been signed and waiting for the patient. And she will start allopurinol 100 mg daily.  #9: Return to clinic in 5 months   Orders: Orders Placed This Encounter  Procedures  . CBC with Differential/Platelet  . COMPLETE METABOLIC PANEL WITH GFR  . Uric acid  . Sedimentation rate   Meds ordered this encounter  Medications  . colchicine 0.6 MG tablet    Sig: Take 1 tablet (0.6 mg total) by mouth daily.    Dispense:  30 tablet    Refill:  2    Order Specific Question:   Supervising Provider    Answer:   Bo Merino [[id number]]  . allopurinol (ZYLOPRIM) 100 MG tablet    Sig:  Take 1 tablet (100 mg total) by mouth daily. Start this medicine on 10/25/2016. Continue taking this medication until we advised her to stop. Requests refill from our office as needed. (Also continue Colcrys along with this medication).    Dispense:  30 tablet    Refill:  2    Patient will start this medication 10/25/2016. Do not release this medication to the patient until that date. She needs to be on Colcrys for the first 2 weeks and then we will add allopurinol.    Order Specific Question:   Supervising Provider    Answer:   Bo Merino [phone]    Face-to-face time spent with patient was 30 minutes. 50% of time was spent in counseling and coordination of care.  Follow-Up Instructions: Return in about 4 weeks (around 11/11/2016) for MSU crystals in [place], oa kj, bil. knee pain, oa hands, ddd c-spne.   Eliezer Lofts, PA-C

## 2016-10-25 ENCOUNTER — Ambulatory Visit (INDEPENDENT_AMBULATORY_CARE_PROVIDER_SITE_OTHER): Payer: PPO | Admitting: Orthopaedic Surgery

## 2016-11-01 ENCOUNTER — Ambulatory Visit (INDEPENDENT_AMBULATORY_CARE_PROVIDER_SITE_OTHER): Payer: PPO | Admitting: Orthopaedic Surgery

## 2016-11-02 DIAGNOSIS — H25813 Combined forms of age-related cataract, bilateral: Secondary | ICD-10-CM | POA: Diagnosis not present

## 2016-11-02 DIAGNOSIS — H40033 Anatomical narrow angle, bilateral: Secondary | ICD-10-CM | POA: Diagnosis not present

## 2016-11-02 DIAGNOSIS — H04123 Dry eye syndrome of bilateral lacrimal glands: Secondary | ICD-10-CM | POA: Diagnosis not present

## 2016-11-02 DIAGNOSIS — H2589 Other age-related cataract: Secondary | ICD-10-CM | POA: Diagnosis not present

## 2016-11-02 DIAGNOSIS — H43393 Other vitreous opacities, bilateral: Secondary | ICD-10-CM | POA: Diagnosis not present

## 2016-11-08 ENCOUNTER — Other Ambulatory Visit: Payer: Self-pay | Admitting: *Deleted

## 2016-11-08 ENCOUNTER — Ambulatory Visit (INDEPENDENT_AMBULATORY_CARE_PROVIDER_SITE_OTHER): Payer: PPO | Admitting: Orthopaedic Surgery

## 2016-11-08 ENCOUNTER — Encounter (INDEPENDENT_AMBULATORY_CARE_PROVIDER_SITE_OTHER): Payer: Self-pay | Admitting: Orthopaedic Surgery

## 2016-11-08 VITALS — BP 128/74 | HR 70 | Resp 14 | Ht 67.0 in | Wt 207.0 lb

## 2016-11-08 DIAGNOSIS — M2550 Pain in unspecified joint: Secondary | ICD-10-CM

## 2016-11-08 DIAGNOSIS — G8929 Other chronic pain: Secondary | ICD-10-CM | POA: Diagnosis not present

## 2016-11-08 DIAGNOSIS — M25562 Pain in left knee: Secondary | ICD-10-CM | POA: Diagnosis not present

## 2016-11-08 DIAGNOSIS — Z79899 Other long term (current) drug therapy: Secondary | ICD-10-CM

## 2016-11-08 LAB — CBC WITH DIFFERENTIAL/PLATELET
BASOS ABS: 0 {cells}/uL (ref 0–200)
Basophils Relative: 0 %
EOS ABS: 94 {cells}/uL (ref 15–500)
Eosinophils Relative: 2 %
HCT: 38.3 % (ref 35.0–45.0)
Hemoglobin: 12.6 g/dL (ref 11.7–15.5)
LYMPHS PCT: 45 %
Lymphs Abs: 2115 cells/uL (ref 850–3900)
MCH: 29.2 pg (ref 27.0–33.0)
MCHC: 32.9 g/dL (ref 32.0–36.0)
MCV: 88.9 fL (ref 80.0–100.0)
MONOS PCT: 9 %
MPV: 9.9 fL (ref 7.5–12.5)
Monocytes Absolute: 423 cells/uL (ref 200–950)
Neutro Abs: 2068 cells/uL (ref 1500–7800)
Neutrophils Relative %: 44 %
PLATELETS: 294 10*3/uL (ref 140–400)
RBC: 4.31 MIL/uL (ref 3.80–5.10)
RDW: 13.2 % (ref 11.0–15.0)
WBC: 4.7 10*3/uL (ref 3.8–10.8)

## 2016-11-08 NOTE — Progress Notes (Signed)
   Office Visit Note   Patient: Lauren Richard           Date of Birth: 1935-12-24           MRN: [id number] Visit Date: 11/08/2016              Requested by: Lauren Chard, MD [address] [place], [place] [address] PCP: Lauren Greenland, MD   Assessment & Plan: Visit Diagnoses: End-stage osteoarthritis left knee  Plan: Long discussion regarding present status of left knee-Lauren Richard would like to proceed with knee replacement. We will give her a clearance form for Lauren Richard and schedule on a time convenient for Lauren Richard. She will require postop rehabilitation-she lives by herself. Several of her siblings have had prior knee replacement and she is somewhat familiar with the procedure. I did discuss in detail knee replacement and rehabilitation.  Follow-Up Instructions: No Follow-up on file.    Orders:  No orders of the defined types were placed in this encounter.  No orders of the defined types were placed in this encounter.     Procedures: No procedures performed   Clinical Data: No additional findings.   Subjective: No chief complaint on file.   Pt presents with Left knee pain and soreness. Pt is referred by Lauren Richard and Lauren Richard. She states she has soreness and trouble with standing when seating for any length of time.  Lauren Richard lives by herself and is having increasing difficulty performing activities of daily living. She has 14 stairs at her home that are becoming more  difficult to maneuver. She has had prior cortisone and Visco supplementation in her left knee. Films demonstrate end-stage changes in all 3 compartments  Review of Systems   Objective: Vital Signs: Resp 14   Ht 5\' 7"  (1.702 m)   Wt 207 lb (93.9 kg)   BMI 32.42 kg/m   Physical Exam  Ortho Exam left knee exam demonstrates lack of 15 to full extension. Several degrees of varus with weightbearing. Minimal bilateral ankle swelling but no pain. Good pulses. Skin intact. Small  effusion left knee without instability. Considerable patellar crepitation and medial lateral joint pain.  Specialty Comments:  No specialty comments available.  Imaging: No results found.   PMFS History: Patient Active Problem List   Diagnosis Date Noted  . Status post left foot surgery 12/03/2015  . Hammertoe 11/03/2015   Past Medical History:  Diagnosis Date  . Arthritis   . Hyperlipidemia   . Hypertension     No family history on file.  No past surgical history on file. Social History   Occupational History  . Not on file.   Social History Main Topics  . Smoking status: Never Smoker  . Smokeless tobacco: Not on file  . Alcohol use 0.0 oz/week  . Drug use: No  . Sexual activity: Not on file

## 2016-11-09 ENCOUNTER — Telehealth: Payer: Self-pay | Admitting: Radiology

## 2016-11-09 ENCOUNTER — Other Ambulatory Visit: Payer: Self-pay | Admitting: Radiology

## 2016-11-09 ENCOUNTER — Other Ambulatory Visit: Payer: Self-pay | Admitting: Rheumatology

## 2016-11-09 LAB — COMPLETE METABOLIC PANEL WITH GFR
ALT: 14 U/L (ref 6–29)
AST: 19 U/L (ref 10–35)
Albumin: 3.9 g/dL (ref 3.6–5.1)
Alkaline Phosphatase: 77 U/L (ref 33–130)
BUN: 18 mg/dL (ref 7–25)
CHLORIDE: 105 mmol/L (ref 98–110)
CO2: 21 mmol/L (ref 20–31)
Calcium: 10.5 mg/dL — ABNORMAL HIGH (ref 8.6–10.4)
Creat: 1.07 mg/dL — ABNORMAL HIGH (ref 0.60–0.93)
GFR, EST AFRICAN AMERICAN: 57 mL/min — AB (ref >=60)
GFR, EST NON AFRICAN AMERICAN: 50 mL/min — AB (ref >=60)
Glucose, Bld: 112 mg/dL — ABNORMAL HIGH (ref 65–99)
Potassium: 4.1 mmol/L (ref 3.5–5.3)
Sodium: 140 mmol/L (ref 135–146)
Total Bilirubin: 0.6 mg/dL (ref 0.2–1.2)
Total Protein: 7.5 g/dL (ref 6.1–8.1)

## 2016-11-09 LAB — URIC ACID: Uric Acid, Serum: 5.5 mg/dL (ref 2.5–7.0)

## 2016-11-09 LAB — SEDIMENTATION RATE: SED RATE: 14 mm/h (ref 0–30)

## 2016-11-09 MED ORDER — ALLOPURINOL 100 MG PO TABS: 100.0000 mg | ORAL_TABLET | Freq: Every day | ORAL | 0 refills | Status: DC

## 2016-11-09 NOTE — Telephone Encounter (Signed)
He did, but his sig and notes were too long and the RX did not go, I have resent. Advised patient of special instructions, she has voiced understanding.

## 2016-11-09 NOTE — Telephone Encounter (Signed)
Patient was told yesterday by Mr Carlyon Shadow he was going to send an rx for her gout medicine to [facility] on [address]

## 2016-11-09 NOTE — Telephone Encounter (Signed)
-----   Message from [place], [place] sent at 11/09/2016  1:25 PM EST ----- Send copy of labs to Dr. Bryon Lions and tell patient #1: Uric acid is normal at 5.5 (continue treatment as already discussed: Allopurinol every day and colchicine every day.)  #2: Sedimentation rate is normal at 14  #3: CBC with differential is normal  #4: CMP with GFR is normal except creatinine slightly elevated at 1.07 (we will monitor) and GFR slightly low at 57, we will monitor.  Continue treatment as discussed in office. If any questions or concerns, please call us and feel free to ask any questions.

## 2016-12-10 ENCOUNTER — Telehealth: Payer: Self-pay | Admitting: Rheumatology

## 2016-12-10 ENCOUNTER — Other Ambulatory Visit: Payer: Self-pay | Admitting: Rheumatology

## 2016-12-10 NOTE — Telephone Encounter (Signed)
Patient requesting for refill on Tramadol  Last Visit: 10/14/16 Next Visit: 03/14/17 UDS 04/07/16 narcotic agreement 02/17/16  Okay to refill Tramadol?

## 2016-12-10 NOTE — Telephone Encounter (Signed)
Patient left a message requesting a refill on prescription [id number].  [id number].

## 2016-12-13 ENCOUNTER — Other Ambulatory Visit: Payer: Self-pay | Admitting: Rheumatology

## 2016-12-13 MED ORDER — TRAMADOL HCL 50 MG PO TABS: 50.0000 mg | ORAL_TABLET | Freq: Two times a day (BID) | ORAL | 2 refills | Status: DC | PRN

## 2016-12-13 NOTE — Telephone Encounter (Signed)
Patient advised prescription sent to the pharmacy.  

## 2016-12-13 NOTE — Telephone Encounter (Signed)
Patient left a message in regards to her tramadol being sent into the pharmacy today.  [id number].  Thank you.

## 2017-01-12 DIAGNOSIS — Z1231 Encounter for screening mammogram for malignant neoplasm of breast: Secondary | ICD-10-CM | POA: Diagnosis not present

## 2017-01-24 DIAGNOSIS — I10 Essential (primary) hypertension: Secondary | ICD-10-CM | POA: Diagnosis not present

## 2017-01-24 DIAGNOSIS — N951 Menopausal and female climacteric states: Secondary | ICD-10-CM | POA: Diagnosis not present

## 2017-01-24 DIAGNOSIS — Z01818 Encounter for other preprocedural examination: Secondary | ICD-10-CM | POA: Diagnosis not present

## 2017-01-24 DIAGNOSIS — R9431 Abnormal electrocardiogram [ECG] [EKG]: Secondary | ICD-10-CM | POA: Diagnosis not present

## 2017-01-24 DIAGNOSIS — M25562 Pain in left knee: Secondary | ICD-10-CM | POA: Diagnosis not present

## 2017-02-09 ENCOUNTER — Telehealth: Payer: Self-pay | Admitting: Cardiovascular Disease

## 2017-02-09 NOTE — Telephone Encounter (Signed)
Received records from [facility] for appointment on 03/03/17 with Dr Oval Linsey.  Records put with Dr Blenda Mounts schedule for 03/03/17. lp

## 2017-02-15 ENCOUNTER — Ambulatory Visit: Payer: Self-pay | Admitting: Cardiovascular Disease

## 2017-03-03 ENCOUNTER — Ambulatory Visit (INDEPENDENT_AMBULATORY_CARE_PROVIDER_SITE_OTHER): Payer: PPO | Admitting: Cardiovascular Disease

## 2017-03-03 ENCOUNTER — Encounter: Payer: Self-pay | Admitting: Cardiovascular Disease

## 2017-03-03 ENCOUNTER — Telehealth (INDEPENDENT_AMBULATORY_CARE_PROVIDER_SITE_OTHER): Payer: Self-pay | Admitting: Radiology

## 2017-03-03 ENCOUNTER — Telehealth (INDEPENDENT_AMBULATORY_CARE_PROVIDER_SITE_OTHER): Payer: Self-pay | Admitting: Orthopedic Surgery

## 2017-03-03 VITALS — BP 124/76 | HR 107 | Ht 67.0 in | Wt 198.2 lb

## 2017-03-03 DIAGNOSIS — E7800 Pure hypercholesterolemia, unspecified: Secondary | ICD-10-CM

## 2017-03-03 DIAGNOSIS — I10 Essential (primary) hypertension: Secondary | ICD-10-CM

## 2017-03-03 DIAGNOSIS — E785 Hyperlipidemia, unspecified: Secondary | ICD-10-CM | POA: Insufficient documentation

## 2017-03-03 DIAGNOSIS — M1712 Unilateral primary osteoarthritis, left knee: Secondary | ICD-10-CM | POA: Diagnosis not present

## 2017-03-03 DIAGNOSIS — M1990 Unspecified osteoarthritis, unspecified site: Secondary | ICD-10-CM | POA: Insufficient documentation

## 2017-03-03 DIAGNOSIS — I4891 Unspecified atrial fibrillation: Secondary | ICD-10-CM | POA: Diagnosis not present

## 2017-03-03 DIAGNOSIS — E669 Obesity, unspecified: Secondary | ICD-10-CM | POA: Insufficient documentation

## 2017-03-03 HISTORY — DX: Obesity, unspecified: E66.9

## 2017-03-03 HISTORY — DX: Essential (primary) hypertension: I10

## 2017-03-03 HISTORY — DX: Unspecified osteoarthritis, unspecified site: M19.90

## 2017-03-03 MED ORDER — APIXABAN 5 MG PO TABS: 5.0000 mg | ORAL_TABLET | Freq: Two times a day (BID) | ORAL | 5 refills | Status: DC

## 2017-03-03 NOTE — Patient Instructions (Addendum)
Your physician has recommended you make the following change in your medication:  -- TAKE aspirin 81mg  until after surgery -- after surgery, START eliquis 5mg  twice daily & STOP aspirin at this time.   -- free 30 day card provided provided to patient  Your physician has requested that you have an echocardiogram @ [address]. Echocardiography is a painless test that uses sound waves to create images of your heart. It provides your doctor with information about the size and shape of your heart and how well your heart's chambers and valves are working. This procedure takes approximately one hour. There are no restrictions for this procedure.  Your physician recommends that you schedule a follow-up appointment in: [place] with Dr. Oval Linsey  ** you are cleared for surgery, per Dr. Oval Linsey

## 2017-03-03 NOTE — Telephone Encounter (Signed)
Jenna calling from [facility] Dr. Jonelle Sidle [place]'s office. Patient stated she had an abnormal EKG done at Dr. Arlean Hopping office. They are needing the results of this faxed to [phone]. Their callback number is [phone]

## 2017-03-03 NOTE — Progress Notes (Signed)
Cardiology Office Note   Date:  03/03/2017   ID:  Lauren Richard, Lauren Richard [birth year], MRN [id number]  PCP:  Lauren Chard, MD  Cardiologist:   Lauren Latch, MD   Chief Complaint  Patient presents with  . New Patient (Initial Visit)    Abnormal EKG    History of Present Illness: Lauren Richard is a 81 y.o. female with hypertension and hyperlipidemia who is being seen today for the evaluation of pre-surgical risk assessment at the request of Lauren Richard, Lauren Richard.  Lauren Richard needs L knee replacement.  Lauren Richard referred her to Lauren Richard for pre-op clearance.  She saw Lauren Richard and was noted to be in atrial fibrillation on EKG.  She was started on aspirin 81 mg daily and referred to cardiology for further evaluation.  Lauren Richard denies chest pain, shortness of breath, palpitations, lightheadedness or dizziness. In the past she liked to do water aerobics several times per week. She had no exertional symptoms. Her exercise is now limited by knee pain. She is able to walk up one flight of steps without stopping. She is unable to walk more than 4 blocks due to knee pain. She denies chest pain or shortness of breath.  Past Medical History:  Diagnosis Date  . Arthritis   . Essential hypertension 03/03/2017  . Hyperlipidemia   . Hypertension   . Obesity 03/03/2017  . Osteoarthritis 03/03/2017    History reviewed. No pertinent surgical history.   Current Outpatient Prescriptions  Medication Sig Dispense Refill  . amLODipine (NORVASC) 5 MG tablet     . apixaban (ELIQUIS) 5 MG TABS tablet Take 1 tablet (5 mg total) by mouth 2 (two) times daily. 60 tablet 5  . aspirin EC 81 MG tablet Take 81 mg by mouth daily.    Marland Kitchen atorvastatin (LIPITOR) 20 MG tablet Take 20 mg by mouth daily.    Marland Kitchen Fe Bisgly-Succ-C-Thre-B12-FA (IRON 21/7 PO) Take by mouth.    Marland Kitchen HYDROcodone-acetaminophen (NORCO/VICODIN) 5-325 MG tablet Take 1 tablet by mouth every 6 (six) hours as needed for moderate pain.    Marland Kitchen  losartan-hydrochlorothiazide (HYZAAR) 100-25 MG tablet     . potassium chloride (K-DUR) 10 MEQ tablet     . traMADol (ULTRAM) 50 MG tablet Take 1-2 tablets (50-100 mg total) by mouth 2 (two) times daily as needed. 120 tablet 2   No current facility-administered medications for this visit.     Allergies:   Patient has no known allergies.    Social History:  The patient  reports that she has never smoked. She has never used smokeless tobacco. She reports that she drinks alcohol. She reports that she does not use drugs.   Family History:  The patient's family history includes Asthma in her sister; Atrial fibrillation in her sister; Congestive Heart Failure in her sister; Diabetes in her sister; Hypertension in her sister; Kidney Richard in her mother; Lung Richard in her father.    ROS:  Please see the history of present illness.   Otherwise, review of systems are positive for none.   All other systems are reviewed and negative.    PHYSICAL EXAM: VS:  BP 124/76   Pulse (!) 107   Ht 5\' 7"  (1.702 m)   Wt 89.9 kg (198 lb 3.2 oz)   BMI 31.04 kg/m  , BMI Body mass index is 31.04 kg/m. GENERAL:  Well appearing.  No acute distress.  HEENT:  Pupils equal round and reactive, fundi not visualized,  oral mucosa unremarkable NECK:  No jugular venous distention, waveform within normal limits, carotid upstroke brisk and symmetric, no bruits, no thyromegaly LYMPHATICS:  No cervical adenopathy LUNGS:  Clear to auscultation bilaterally HEART:  RRR.  PMI not displaced or sustained,S1 and S2 within normal limits, no S3, no S4, no clicks, no rubs, II/VI systolic murmur at the LUSB ABD:  Flat, positive bowel sounds normal in frequency in pitch, no bruits, no rebound, no guarding, no midline pulsatile mass, no hepatomegaly, no splenomegaly EXT:  2 plus pulses throughout, no edema, no cyanosis no clubbing SKIN:  No rashes no nodules NEURO:  Cranial nerves II through XII grossly intact, motor grossly intact  throughout PSYCH:  Cognitively intact, oriented to person place and time   EKG:  EKG is ordered today. The ekg ordered today demonstrates sinus tachycardia. Rate 107 bpm.   Recent Labs: 11/08/2016: ALT 14; BUN 18; Creat 1.07; Hemoglobin 12.6; Platelets 294; Potassium 4.1; Sodium 140    Lipid Panel No results found for: CHOL, TRIG, HDL, CHOLHDL, VLDL, LDLCALC, LDLDIRECT    Wt Readings from Last 3 Encounters:  03/03/17 89.9 kg (198 lb 3.2 oz)  11/08/16 93.9 kg (207 lb)  10/14/16 93.9 kg (207 lb)      ASSESSMENT AND PLAN:  # Paroxysmal atrial fibrillation: Lauren Richard was noted to be in atrial fibrillation at her recent appointment.  Unfortunately I am unable to review her EKG at that time.  We will obtain a copy.  We will also check a TSH and an echocardiogram.  Given that she has surgery pending we will not start anticoagulation until the EKG is reviewed and until after surgery.  We will plan to start Eliquis 5mg  bid post operatively once the atrial fibrillation is verified.  Continue aspirin 81 mg for now. Her heart rate at that appointment was in the 72s. Therefore we will not start any nodal agent at this time.  This patients CHA2DS2-VASc Score and unadjusted Ischemic Stroke Rate (% per year) is equal to 4.8 % stroke rate/year from a score of 4  Above score calculated as 1 point each if present [CHF, HTN, DM, Vascular=MI/PAD/Aortic Plaque, Age if 65-74, or Female] Above score calculated as 2 points each if present [Age > 75, or Stroke/TIA/TE]  # Pre-surgical risk: Lauren Richard does not have any unstable cardiac conditions.  Upon evaluation today, she can achieve 4 METs or greater without anginal symptoms.  According to [facility] and AHA guidelines, she requires no further cardiac workup prior to her noncardiac surgery and should be at acceptable risk.  her NSQIP risk of peri-procedural MI or cardiac arrest is 0.21%.  Our service is available as necessary in the perioperative period.  #  Hypertension: Blood pressure is controlled.  Continue amlodipine, losartan, and hydrochlorothiazide.  # Hyperlipidemia:  Continue atorvastatin.  # Murmur: Ms. Dezarn was noted to have a soft systolic murmur on exam.  Echo as above.   Current medicines are reviewed at length with the patient today.  The patient does not have concerns regarding medicines.  The following changes have been made:  no change  Labs/ tests ordered today include:   Orders Placed This Encounter  Procedures  . EKG 12-Lead  . ECHOCARDIOGRAM COMPLETE     Disposition:   FU with Caryl Fate C. Oval Linsey, MD, [facility] in 3 months.    This note was written with the assistance of speech recognition software.  Please excuse any transcriptional errors.  Signed, Clarance Bollard C. Oval Linsey, MD, [facility]  03/03/2017 12:35 PM    [facility]

## 2017-03-03 NOTE — Telephone Encounter (Signed)
Please advise 

## 2017-03-03 NOTE — Telephone Encounter (Signed)
No idea of an EKG being done since we don't have a machine to do it

## 2017-03-10 NOTE — Telephone Encounter (Signed)
I tried to call Dr. Gabriel Carina in original note, no answer.

## 2017-03-14 ENCOUNTER — Ambulatory Visit (INDEPENDENT_AMBULATORY_CARE_PROVIDER_SITE_OTHER): Payer: PPO | Admitting: Rheumatology

## 2017-03-14 ENCOUNTER — Encounter: Payer: Self-pay | Admitting: Rheumatology

## 2017-03-14 ENCOUNTER — Encounter (INDEPENDENT_AMBULATORY_CARE_PROVIDER_SITE_OTHER): Payer: Self-pay | Admitting: Orthopaedic Surgery

## 2017-03-14 ENCOUNTER — Telehealth (INDEPENDENT_AMBULATORY_CARE_PROVIDER_SITE_OTHER): Payer: Self-pay | Admitting: Orthopaedic Surgery

## 2017-03-14 VITALS — BP 140/70 | HR 82 | Resp 14 | Ht 67.0 in | Wt 190.0 lb

## 2017-03-14 DIAGNOSIS — M5030 Other cervical disc degeneration, unspecified cervical region: Secondary | ICD-10-CM

## 2017-03-14 DIAGNOSIS — I10 Essential (primary) hypertension: Secondary | ICD-10-CM

## 2017-03-14 DIAGNOSIS — M47812 Spondylosis without myelopathy or radiculopathy, cervical region: Secondary | ICD-10-CM

## 2017-03-14 DIAGNOSIS — M19041 Primary osteoarthritis, right hand: Secondary | ICD-10-CM

## 2017-03-14 DIAGNOSIS — M170 Bilateral primary osteoarthritis of knee: Secondary | ICD-10-CM | POA: Diagnosis not present

## 2017-03-14 DIAGNOSIS — M19042 Primary osteoarthritis, left hand: Secondary | ICD-10-CM

## 2017-03-14 MED ORDER — TRAMADOL HCL 50 MG PO TABS: 50.0000 mg | ORAL_TABLET | Freq: Two times a day (BID) | ORAL | 2 refills | Status: DC | PRN

## 2017-03-14 MED ORDER — DICLOFENAC SODIUM 1 % TD GEL: TRANSDERMAL | 3 refills | Status: DC

## 2017-03-14 NOTE — Progress Notes (Signed)
Office Visit Note  Patient: Lauren Richard             Date of Birth: 01-24-1936           MRN: [id number]             PCP: Glendale Chard, MD Referring: Glendale Chard, MD Visit Date: 03/14/2017 Occupation: @GUAROCC @    Subjective:  Pain of the Right Knee and Pain of the Left Knee   History of Present Illness: Lauren Richard is a 81 y.o. female  Last seen in our office 10/14/2016. At that visit patient had complaint of left knee pain and stiffness. Also, in 06/16/2016: She had 66 mL's of synovial fluid aspirated from her left knee.  She is seeing Dr. Durward Fortes for her knees. She has been given the option of knee replacement by Dr. Durward Fortes and we are awaiting her decision to move forward with the surgery if that's what she wants to do.  Today, she continues to have stiffness/soreness in her left knee. She plans to move forward with her left knee total knee replacement through Dr. Rudene Anda office. She brought in clearance from her cardiologist and I handed.letter to Marcie Bal so that way, they can schedule her for the upcoming surgery. We discussed the importance of doing exercises for the left knee prior to the surgery to maximize her strength in her left knee. I also encouraged the patient to do rehabilitation exactly as suggested so that she can get long-term benefit from her left total knee replacement. Patient understands and is agreeable.   Activities of Daily Living:  Patient reports morning stiffness for 30 minutes.   Patient Reports nocturnal pain.  Difficulty dressing/grooming: Reports Difficulty climbing stairs: Reports Difficulty getting out of chair: Reports Difficulty using hands for taps, buttons, cutlery, and/or writing: Reports   Review of Systems  Constitutional: Negative for fatigue.  HENT: Negative for mouth sores and mouth dryness.   Eyes: Negative for dryness.  Respiratory: Negative for shortness of breath.   Gastrointestinal: Negative for  constipation and diarrhea.  Musculoskeletal: Negative for myalgias and myalgias.  Skin: Negative for sensitivity to sunlight.  Psychiatric/Behavioral: Negative for decreased concentration and sleep disturbance.    PMFS History:  Patient Active Problem List   Diagnosis Date Noted  . Essential hypertension 03/03/2017  . Hyperlipidemia 03/03/2017  . Osteoarthritis 03/03/2017  . Obesity 03/03/2017  . Status post left foot surgery 12/03/2015  . Hammertoe 11/03/2015    Past Medical History:  Diagnosis Date  . Arthritis   . Essential hypertension 03/03/2017  . Hyperlipidemia   . Hypertension   . Obesity 03/03/2017  . Osteoarthritis 03/03/2017    Family History  Problem Relation Age of Onset  . Kidney cancer Mother   . Lung cancer Father   . Congestive Heart Failure Sister   . Asthma Sister   . Atrial fibrillation Sister   . Hypertension Sister   . Diabetes Sister    History reviewed. No pertinent surgical history. Social History   Social History Narrative  . No narrative on file     Objective: Vital Signs: BP 140/70   Pulse 82   Resp 14   Ht 5' 7"  (1.702 m)   Wt 190 lb (86.2 kg)   BMI 29.76 kg/m    Physical Exam  Constitutional: She is oriented to person, place, and time. She appears well-developed and well-nourished.  HENT:  Head: Normocephalic and atraumatic.  Eyes: EOM are normal. Pupils are equal, round, and reactive  to light.  Cardiovascular: Normal rate, regular rhythm and normal heart sounds.  Exam reveals no gallop and no friction rub.   No murmur heard. Pulmonary/Chest: Effort normal and breath sounds normal. She has no wheezes. She has no rales.  Abdominal: Soft. Bowel sounds are normal. She exhibits no distension. There is no tenderness. There is no guarding. No hernia.  Musculoskeletal: Normal range of motion. She exhibits no edema, tenderness or deformity.  Lymphadenopathy:    She has no cervical adenopathy.  Neurological: She is alert and oriented  to person, place, and time. Coordination normal.  Skin: Skin is warm and dry. Capillary refill takes less than 2 seconds. No rash noted.  Psychiatric: She has a normal mood and affect. Her behavior is normal.  Nursing note and vitals reviewed.    Musculoskeletal Exam:  Full range of motion of all joints Grip strength is equal and strong bilaterally Fibromyalgia tender points are all absent  Both knees are hurting but the right knee is hurting more lately because it is compensating for the left knee.   CDAI Exam: CDAI Homunculus Exam:   Joint Counts:  CDAI Tender Joint count: 0 CDAI Swollen Joint count: 0  Global Assessments:  Patient Global Assessment: 7 Provider Global Assessment: 7  CDAI Calculated Score: 14     Investigation: No additional findings.  Orders Only on 11/08/2016  Component Date Value Ref Range Status  . WBC 11/08/2016 4.7  3.8 - 10.8 K/uL Final  . RBC 11/08/2016 4.31  3.80 - 5.10 MIL/uL Final  . Hemoglobin 11/08/2016 12.6  11.7 - 15.5 g/dL Final  . HCT 11/08/2016 38.3  35.0 - 45.0 % Final  . MCV 11/08/2016 88.9  80.0 - 100.0 fL Final  . MCH 11/08/2016 29.2  27.0 - 33.0 pg Final  . MCHC 11/08/2016 32.9  32.0 - 36.0 g/dL Final  . RDW 11/08/2016 13.2  11.0 - 15.0 % Final  . Platelets 11/08/2016 294  140 - 400 K/uL Final  . MPV 11/08/2016 9.9  7.5 - 12.5 fL Final  . Neutro Abs 11/08/2016 2068  1,500 - 7,800 cells/uL Final  . Lymphs Abs 11/08/2016 2115  850 - 3,900 cells/uL Final  . Monocytes Absolute 11/08/2016 423  200 - 950 cells/uL Final  . Eosinophils Absolute 11/08/2016 94  15 - 500 cells/uL Final  . Basophils Absolute 11/08/2016 0  0 - 200 cells/uL Final  . Neutrophils Relative % 11/08/2016 44  % Final  . Lymphocytes Relative 11/08/2016 45  % Final  . Monocytes Relative 11/08/2016 9  % Final  . Eosinophils Relative 11/08/2016 2  % Final  . Basophils Relative 11/08/2016 0  % Final  . Smear Review 11/08/2016 Criteria for review not met   Final    . Sodium 11/08/2016 140  135 - 146 mmol/L Final  . Potassium 11/08/2016 4.1  3.5 - 5.3 mmol/L Final  . Chloride 11/08/2016 105  98 - 110 mmol/L Final  . CO2 11/08/2016 21  20 - 31 mmol/L Final  . Glucose, Bld 11/08/2016 112* 65 - 99 mg/dL Final  . BUN 11/08/2016 18  7 - 25 mg/dL Final  . Creat 11/08/2016 1.07* 0.60 - 0.93 mg/dL Final   Comment:   For patients > or = 79 years of age: The upper reference limit for Creatinine is approximately 13% higher for people identified as African-American.     . Total Bilirubin 11/08/2016 0.6  0.2 - 1.2 mg/dL Final  . Alkaline Phosphatase 11/08/2016 77  33 - 130 U/L Final  . AST 11/08/2016 19  10 - 35 U/L Final  . ALT 11/08/2016 14  6 - 29 U/L Final  . Total Protein 11/08/2016 7.5  6.1 - 8.1 g/dL Final  . Albumin 11/08/2016 3.9  3.6 - 5.1 g/dL Final  . Calcium 11/08/2016 10.5* 8.6 - 10.4 mg/dL Final  . GFR, Est African American 11/08/2016 57* >=60 mL/min Final  . GFR, Est Non African American 11/08/2016 50* >=60 mL/min Final  . Uric Acid, Serum 11/08/2016 5.5  2.5 - 7.0 mg/dL Final  . Sed Rate 11/08/2016 14  0 - 30 mm/hr Final     Imaging: No results found.      Speciality Comments: No specialty comments available.    Procedures:  No procedures performed Allergies: Patient has no known allergies.   Assessment / Plan:     Visit Diagnoses: Primary osteoarthritis of both knees  Primary osteoarthritis of both hands  DJD (degenerative joint disease), cervical  Essential hypertension   Plan: #1: Osteoarthritis of knees Patient plans to get a left total knee replacement through Dr. Rudene Anda office. She brought her clearance from cardiology and I handed that paperwork to miss Jonathon Resides.  Currently, her right knee is compensating for her left knee and is having some more pain. As a result, I offered the patient Shields brace for the right knee and she is agreeable. She can also buy over-the-counter sleeve brace for the  right knee if she finds the Shields brace to be too old T and uncomfortable. It is the patient's choice which brace she would like to obtain. The prescription is written and handed to the patient and she will determine what is affordable. I've advised her to look at the [employer] from [employer]. She is accompanied by her sister and niece and they will help coordinate care for her currently.  #2: Osteoporosis of hands. Some pain and discomfort to bilateral hands. We discussed hand exercises and I demonstrated 3 hand exercises for the patient.  #3: DDD of the C-spine. Ongoing discomfort but currently she is doing well  #4: Essential hypertension. Managed by PCP  #5: Last labs were from January 2018 and are within normal limits  #6: We had a long discussion on narcotic use. I advised her to minimize use of her tramadol. We also discussed Voltaren gel in great detail. She is aware on how to use it properly and to minimize its use when this is possible  #7: Return to clinic in 5 months  Orders: No orders of the defined types were placed in this encounter.  Meds ordered this encounter  Medications  . traMADol (ULTRAM) 50 MG tablet    Sig: Take 1-2 tablets (50-100 mg total) by mouth 2 (two) times daily as needed.    Dispense:  120 tablet    Refill:  2    Order Specific Question:   Supervising Provider    Answer:   Bo Merino [[id number]]  . diclofenac sodium (VOLTAREN) 1 % GEL    Sig: Voltaren Gel 3 grams to 3 large joints upto TID 3 TUBES with 3 refills    Dispense:  3 Tube    Refill:  3    Voltaren Gel 3 grams to 3 large joints upto TID 3 TUBES with 3 refills    Order Specific Question:   Supervising Provider    Answer:   Bo Merino [phone]    Face-to-face time spent with patient was  30 minutes. 50% of time was spent in counseling and coordination of care.  Follow-Up Instructions: Return in about 5 months (around 08/14/2017) for OA KJ, OA hands, ddd  c-spine, .   Eliezer Lofts, PA-C  Note - This record has been created using [employer].  Chart creation errors have been sought, but may not always  have been located. Such creation errors do not reflect on  the standard of medical care.

## 2017-03-14 NOTE — Telephone Encounter (Signed)
LVM with pt to call to schedule surgery. Will try pt again at a later time. 

## 2017-03-15 ENCOUNTER — Telehealth (INDEPENDENT_AMBULATORY_CARE_PROVIDER_SITE_OTHER): Payer: Self-pay | Admitting: Radiology

## 2017-03-15 NOTE — Telephone Encounter (Signed)
LVMOM that Weldon Picking will be the one who calls her for scheduling and she only provided clearances on Monday 6/4.

## 2017-03-15 NOTE — Telephone Encounter (Signed)
Patient left voicemail on my phone yesterday afternoon requesting call from Dr. Rudene Anda staff in regards to getting scheduled for surgery. She requests a return call.

## 2017-03-25 ENCOUNTER — Other Ambulatory Visit (INDEPENDENT_AMBULATORY_CARE_PROVIDER_SITE_OTHER): Payer: Self-pay | Admitting: Orthopaedic Surgery

## 2017-04-06 ENCOUNTER — Encounter (INDEPENDENT_AMBULATORY_CARE_PROVIDER_SITE_OTHER): Payer: Self-pay | Admitting: Orthopedic Surgery

## 2017-04-06 ENCOUNTER — Ambulatory Visit (INDEPENDENT_AMBULATORY_CARE_PROVIDER_SITE_OTHER): Payer: PPO | Admitting: Orthopedic Surgery

## 2017-04-06 ENCOUNTER — Ambulatory Visit (INDEPENDENT_AMBULATORY_CARE_PROVIDER_SITE_OTHER): Payer: PPO

## 2017-04-06 VITALS — BP 150/85 | HR 97 | Ht 67.0 in | Wt 189.0 lb

## 2017-04-06 DIAGNOSIS — G8929 Other chronic pain: Secondary | ICD-10-CM | POA: Diagnosis not present

## 2017-04-06 DIAGNOSIS — M25562 Pain in left knee: Secondary | ICD-10-CM

## 2017-04-06 NOTE — Progress Notes (Signed)
[name], MD   [name], PA-C [address] [place], [place]  [address]                             [phone]   ORTHOPAEDIC HISTORY & PHYSICAL  Lauren Richard MRN:  [id number] DOB/SEX:  04/03/1936/female  CHIEF COMPLAINT:  Painful left Knee  HISTORY: Patient is a 81 y.o. female presented with a history of pain in the left knee for 30 years. Onset of symptoms was gradual starting 30 years ago with gradually worsening course since that time. Prior procedures on the knee are arthroscopy x 2. Patient has been treated conservatively with over-the-counter NSAIDs, tramadol, and activity modification. Patient currently rates pain in the knee at 8 out of 10 with activity. There is no pain at night. present.  They have been previously treated with: NSAIDS: Narcotics, NSAID, Steriods with mild improvement  Knee injection with corticosteroid  was performed Knee injection with visco supplementation was performed Medications: Narcotics, NSAID, Snyvisc with mild improvement  PAST MEDICAL HISTORY: Patient Active Problem List   Diagnosis Date Noted  . Essential hypertension 03/03/2017  . Hyperlipidemia 03/03/2017  . Osteoarthritis 03/03/2017  . Obesity 03/03/2017  . Status post left foot surgery 12/03/2015  . Hammertoe 11/03/2015   Past Medical History:  Diagnosis Date  . Arthritis   . Essential hypertension 03/03/2017  . Hyperlipidemia   . Hypertension   . Obesity 03/03/2017  . Osteoarthritis 03/03/2017   No past surgical history on file.   MEDICATIONS PRIOR TO ADMISSION:  Current Outpatient Prescriptions:  .  amLODipine (NORVASC) 5 MG tablet, , Disp: , Rfl:  .  apixaban (ELIQUIS) 5 MG TABS tablet, Take 1 tablet (5 mg total) by mouth 2 (two) times daily., Disp: 60 tablet, Rfl: 5 .  aspirin EC 81 MG tablet, Take 81 mg by mouth daily., Disp: , Rfl:  .  atorvastatin (LIPITOR) 20 MG tablet, Take 20 mg by mouth daily., Disp: , Rfl:  .  cholecalciferol (VITAMIN D)  1000 units tablet, Take 1,000 Units by mouth daily., Disp: , Rfl:  .  diclofenac sodium (VOLTAREN) 1 % GEL, Voltaren Gel 3 grams to 3 large joints upto TID 3 TUBES with 3 refills, Disp: 3 Tube, Rfl: 3 .  losartan-hydrochlorothiazide (HYZAAR) 100-25 MG tablet, , Disp: , Rfl:  .  potassium chloride (K-DUR) 10 MEQ tablet, , Disp: , Rfl:  .  traMADol (ULTRAM) 50 MG tablet, Take 1-2 tablets (50-100 mg total) by mouth 2 (two) times daily as needed., Disp: 120 tablet, Rfl: 2   ALLERGIES:  No Known Allergies  REVIEW OF SYSTEMS:  Review of Systems  Genitourinary:       H/o colitis  All other systems reviewed and are negative.   FAMILY HISTORY:   Family History  Problem Relation Age of Onset  . Kidney cancer Mother   . Lung cancer Father   . Congestive Heart Failure Sister   . Asthma Sister   . Atrial fibrillation Sister   . Hypertension Sister   . Diabetes Sister     SOCIAL HISTORY:   Social History   Occupational History  . Not on file.   Social History Main Topics  . Smoking status: Never Smoker  . Smokeless tobacco: Never Used  . Alcohol use 0.0 oz/week  . Drug use: No  . Sexual activity: Not on file     EXAMINATION:  Vital signs in last 24  hours: BP (!) 150/85 (BP Location: Left Arm, Patient Position: Sitting, Cuff Size: Normal)   Pulse 97   Ht 5\' 7"  (1.702 m)   Wt 189 lb (85.7 kg)   BMI 29.60 kg/m   Physical Exam  Constitutional: She is oriented to person, place, and time. She appears well-developed and well-nourished.  HENT:  Head: Normocephalic and atraumatic.  Eyes: Conjunctivae and EOM are normal. Pupils are equal, round, and reactive to light.  Neck: Neck supple.  Cardiovascular: Normal rate, regular rhythm, normal heart sounds and intact distal pulses.   Pulmonary/Chest: Effort normal and breath sounds normal.  Abdominal: Soft. Bowel sounds are normal. There is no tenderness.  Neurological: She is alert and oriented to person, place, and time.  Skin:  Skin is warm and dry.  Psychiatric: She has a normal mood and affect. Her behavior is normal. Judgment and thought content normal.   Ortho Exam  Range of motion today reveals she lacks about 5 of full extension and can get to 95 flexion crepitus with range of motion. Mild effusion without warmth or erythema.  Imaging Review Plain radiographs demonstrate severe degenerative joint disease of the left knee. The overall alignment is significant valgus. The bone quality appears to be good for age and reported activity level.   Xr Knee Complete 4 Views Left  Result Date: 04/06/2017 4 view x-ray of the left knee reveals significant varus deformity with bone on bone medial joint space. Para-articular spurring off of the medial femoral condyle as well as the tibial plateau. Marked incongruity of the joint surface medially. She does have some lateral periarticular spurring. Patellofemoral joint has marked periosteal spurring and bone loss. Posterior osteophytes are noted in the popliteal area. Bone-on-bone patellofemoral joint. Significant peri articular spurs.  ASSESSMENT: End stage arthritis, left knee  Past Medical History:  Diagnosis Date  . Arthritis   . Essential hypertension 03/03/2017  . Hyperlipidemia   . Hypertension   . Obesity 03/03/2017  . Osteoarthritis 03/03/2017    PLAN: Plan for left total knee replacement.  The patient history, physical examination and imaging studies are consistent with advanced degenerative joint disease of the left knee. The patient has failed conservative treatment.  The clearance notes were reviewed.  After discussion with the patient it was felt that Total Knee Replacement was indicated. The procedure,  risks, and benefits of total knee arthroplasty were presented and reviewed. The risks including but not limited to aseptic loosening, infection, blood clots, vascular and nerve injury, stiffness, patella tracking problems and fracture complications among  others were discussed. The patient acknowledged the explanation, agreed to proceed with total knee replacement.  Mike Craze [place], [place] [phone]  04/06/2017 3:21 PM

## 2017-04-06 NOTE — H&P (Signed)
Lauren Fears, MD   [name], PA-C [address], [place], [place]  [address]                             [phone]   ORTHOPAEDIC HISTORY & PHYSICAL  Lauren Richard MRN:  [id number] DOB/SEX:  17-Oct-1935/female  CHIEF COMPLAINT:  Painful left Knee  HISTORY: Patient is a 81 y.o. female presented with a history of pain in the left knee for 30 years. Onset of symptoms was gradual starting 30 years ago with gradually worsening course since that time. Prior procedures on the knee are arthroscopy x 2. Patient has been treated conservatively with over-the-counter NSAIDs, tramadol, and activity modification. Patient currently rates pain in the knee at 8 out of 10 with activity. There is no pain at night. present.  They have been previously treated with: NSAIDS: Narcotics, NSAID, Steriods with mild improvement  Knee injection with corticosteroid  was performed Knee injection with visco supplementation was performed Medications: Narcotics, NSAID, Snyvisc with mild improvement  PAST MEDICAL HISTORY: Patient Active Problem List   Diagnosis Date Noted  . Essential hypertension 03/03/2017  . Hyperlipidemia 03/03/2017  . Osteoarthritis 03/03/2017  . Obesity 03/03/2017  . Status post left foot surgery 12/03/2015  . Hammertoe 11/03/2015   Past Medical History:  Diagnosis Date  . Arthritis   . Essential hypertension 03/03/2017  . Hyperlipidemia   . Hypertension   . Obesity 03/03/2017  . Osteoarthritis 03/03/2017   No past surgical history on file.   MEDICATIONS PRIOR TO ADMISSION:  Current Outpatient Prescriptions:  .  amLODipine (NORVASC) 5 MG tablet, , Disp: , Rfl:  .  apixaban (ELIQUIS) 5 MG TABS tablet, Take 1 tablet (5 mg total) by mouth 2 (two) times daily., Disp: 60 tablet, Rfl: 5 .  aspirin EC 81 MG tablet, Take 81 mg by mouth daily., Disp: , Rfl:  .  atorvastatin (LIPITOR) 20 MG tablet, Take 20 mg by mouth daily., Disp: , Rfl:  .  cholecalciferol (VITAMIN D)  1000 units tablet, Take 1,000 Units by mouth daily., Disp: , Rfl:  .  diclofenac sodium (VOLTAREN) 1 % GEL, Voltaren Gel 3 grams to 3 large joints upto TID 3 TUBES with 3 refills, Disp: 3 Tube, Rfl: 3 .  losartan-hydrochlorothiazide (HYZAAR) 100-25 MG tablet, , Disp: , Rfl:  .  potassium chloride (K-DUR) 10 MEQ tablet, , Disp: , Rfl:  .  traMADol (ULTRAM) 50 MG tablet, Take 1-2 tablets (50-100 mg total) by mouth 2 (two) times daily as needed., Disp: 120 tablet, Rfl: 2   ALLERGIES:  No Known Allergies  REVIEW OF SYSTEMS:  Review of Systems  Genitourinary:       H/o colitis  All other systems reviewed and are negative.   FAMILY HISTORY:   Family History  Problem Relation Age of Onset  . Kidney cancer Mother   . Lung cancer Father   . Congestive Heart Failure Sister   . Asthma Sister   . Atrial fibrillation Sister   . Hypertension Sister   . Diabetes Sister     SOCIAL HISTORY:   Social History   Occupational History  . Not on file.   Social History Main Topics  . Smoking status: Never Smoker  . Smokeless tobacco: Never Used  . Alcohol use 0.0 oz/week  . Drug use: No  . Sexual activity: Not on file     EXAMINATION:  Vital signs in last 24  hours: BP (!) 150/85 (BP Location: Left Arm, Patient Position: Sitting, Cuff Size: Normal)   Pulse 97   Ht 5\' 7"  (1.702 m)   Wt 189 lb (85.7 kg)   BMI 29.60 kg/m   Physical Exam  Constitutional: She is oriented to person, place, and time. She appears well-developed and well-nourished.  HENT:  Head: Normocephalic and atraumatic.  Eyes: Conjunctivae and EOM are normal. Pupils are equal, round, and reactive to light.  Neck: Neck supple.  Cardiovascular: Normal rate, regular rhythm, normal heart sounds and intact distal pulses.   Pulmonary/Chest: Effort normal and breath sounds normal.  Abdominal: Soft. Bowel sounds are normal. There is no tenderness.  Neurological: She is alert and oriented to person, place, and time.  Skin:  Skin is warm and dry.  Psychiatric: She has a normal mood and affect. Her behavior is normal. Judgment and thought content normal.   Ortho Exam  Range of motion today reveals she lacks about 5 of full extension and can get to 95 flexion crepitus with range of motion. Mild effusion without warmth or erythema.  Imaging Review Plain radiographs demonstrate severe degenerative joint disease of the left knee. The overall alignment is significant valgus. The bone quality appears to be good for age and reported activity level.   Xr Knee Complete 4 Views Left  Result Date: 04/06/2017 4 view x-ray of the left knee reveals significant varus deformity with bone on bone medial joint space. Para-articular spurring off of the medial femoral condyle as well as the tibial plateau. Marked incongruity of the joint surface medially. She does have some lateral periarticular spurring. Patellofemoral joint has marked periosteal spurring and bone loss. Posterior osteophytes are noted in the popliteal area. Bone-on-bone patellofemoral joint. Significant peri articular spurs.  ASSESSMENT: End stage arthritis, left knee  Past Medical History:  Diagnosis Date  . Arthritis   . Essential hypertension 03/03/2017  . Hyperlipidemia   . Hypertension   . Obesity 03/03/2017  . Osteoarthritis 03/03/2017    PLAN: Plan for left total knee replacement.  The patient history, physical examination and imaging studies are consistent with advanced degenerative joint disease of the left knee. The patient has failed conservative treatment.  The clearance notes were reviewed.  After discussion with the patient it was felt that Total Knee Replacement was indicated. The procedure,  risks, and benefits of total knee arthroplasty were presented and reviewed. The risks including but not limited to aseptic loosening, infection, blood clots, vascular and nerve injury, stiffness, patella tracking problems and fracture complications among  others were discussed. The patient acknowledged the explanation, agreed to proceed with total knee replacement.  Mike Craze [place], [place] [phone]  04/06/2017 3:21 PM

## 2017-04-14 NOTE — Pre-Procedure Instructions (Signed)
Lauren Richard  04/14/2017      [facility] [address] - [place] ([place]), [place] [address] [place] ([place]) [place] [address] Phone: [phone] Fax: [phone]    Your procedure is scheduled on Tuesday 04/26/2017.  Report to [facility] Admitting at [email or web address].M.  Call this number if you have problems the morning of surgery:  [phone]   Remember:  Do not eat food or drink liquids after midnight.  Take these medicines the morning of surgery with A SIP OF WATER amlodipine (Norvasc) and tramadol (Ultram) - if needed  7 days prior to surgery STOP taking any Aspirin, Aleve, Naproxen, Ibuprofen, Motrin, Advil, Goody's, BC's, all herbal medications, fish oil, and all vitamins, including your diclofenac Sodium (voltaren) gel    Do not wear jewelry, make-up or nail polish.  Do not wear lotions, powders, or perfumes, or deodorant.  Do not shave 48 hours prior to surgery.    Do not bring valuables to the hospital.  [facility] is not responsible for any belongings or valuables.  Contacts, dentures or bridgework may not be worn into surgery.  Leave your suitcase in the car.  After surgery it may be brought to your room.  For patients admitted to the hospital, discharge time will be determined by your treatment team.  Patients discharged the day of surgery will not be allowed to drive home.   Name and phone number of your driver:    Special instructions:   [place]- Preparing For Surgery  Before surgery, you can play an important role. Because skin is not sterile, your skin needs to be as free of germs as possible. You can reduce the number of germs on your skin by washing with CHG (chlorahexidine gluconate) Soap before surgery.  CHG is an antiseptic cleaner which kills germs and bonds with the skin to continue killing germs even after washing.  Please do not use if you have an allergy to CHG or antibacterial soaps. If your skin becomes  reddened/irritated stop using the CHG.  Do not shave (including legs and underarms) for at least 48 hours prior to first CHG shower. It is OK to shave your face.  Please follow these instructions carefully.   1. Shower the NIGHT BEFORE SURGERY and the MORNING OF SURGERY with CHG.   2. If you chose to wash your hair, wash your hair first as usual with your normal shampoo.  3. After you shampoo, rinse your hair and body thoroughly to remove the shampoo.  4. Use CHG as you would any other liquid soap. You can apply CHG directly to the skin and wash gently with a scrungie or a clean washcloth.   5. Apply the CHG Soap to your body ONLY FROM THE NECK DOWN.  Do not use on open wounds or open sores. Avoid contact with your eyes, ears, mouth and genitals (private parts). Wash genitals (private parts) with your normal soap.  6. Wash thoroughly, paying special attention to the area where your surgery will be performed.  7. Thoroughly rinse your body with warm water from the neck down.  8. DO NOT shower/wash with your normal soap after using and rinsing off the CHG Soap.  9. Pat yourself dry with a CLEAN TOWEL.   10. Wear CLEAN PAJAMAS   11. Place CLEAN SHEETS on your bed the night of your first shower and DO NOT SLEEP WITH PETS.    Day of Surgery: Do not  apply any deodorants/lotions. Please wear clean clothes to the hospital/surgery center.      Please read over the following fact sheets that you were given. Pain Booklet, Total Joint Packet, MRSA Information and Surgical Site Infection Prevention

## 2017-04-15 ENCOUNTER — Encounter (HOSPITAL_COMMUNITY): Payer: Self-pay

## 2017-04-15 ENCOUNTER — Encounter (HOSPITAL_COMMUNITY)
Admission: RE | Admit: 2017-04-15 | Discharge: 2017-04-15 | Disposition: A | Discharge status: Home / Self Care | Payer: PPO | Source: Ambulatory Visit | Attending: Orthopaedic Surgery | Admitting: Orthopaedic Surgery

## 2017-04-15 ENCOUNTER — Encounter (HOSPITAL_COMMUNITY)
Admission: RE | Admit: 2017-04-15 | Discharge: 2017-04-15 | Disposition: A | Discharge status: Home / Self Care | Payer: PPO | Source: Ambulatory Visit | Attending: Orthopedic Surgery | Admitting: Orthopedic Surgery

## 2017-04-15 DIAGNOSIS — R918 Other nonspecific abnormal finding of lung field: Secondary | ICD-10-CM | POA: Diagnosis not present

## 2017-04-15 DIAGNOSIS — Z01818 Encounter for other preprocedural examination: Secondary | ICD-10-CM | POA: Diagnosis not present

## 2017-04-15 LAB — CBC WITH DIFFERENTIAL/PLATELET
BASOS ABS: 0 10*3/uL (ref 0.0–0.1)
BASOS PCT: 0 %
Eosinophils Absolute: 0.1 10*3/uL (ref 0.0–0.7)
Eosinophils Relative: 2 %
HEMATOCRIT: 38.3 % (ref 36.0–46.0)
HEMOGLOBIN: 12.7 g/dL (ref 12.0–15.0)
LYMPHS ABS: 2.7 10*3/uL (ref 0.7–4.0)
LYMPHS PCT: 36 %
MCH: 29.5 pg (ref 26.0–34.0)
MCHC: 33.2 g/dL (ref 30.0–36.0)
MCV: 89.1 fL (ref 78.0–100.0)
Monocytes Absolute: 0.4 10*3/uL (ref 0.1–1.0)
Monocytes Relative: 5 %
Neutro Abs: 4.4 10*3/uL (ref 1.7–7.7)
Neutrophils Relative %: 57 %
Platelets: 312 10*3/uL (ref 150–400)
RBC: 4.3 MIL/uL (ref 3.87–5.11)
RDW: 12.6 % (ref 11.5–15.5)
WBC: 7.6 10*3/uL (ref 4.0–10.5)

## 2017-04-15 LAB — COMPREHENSIVE METABOLIC PANEL
ALBUMIN: 4 g/dL (ref 3.5–5.0)
ALK PHOS: 98 U/L (ref 38–126)
ALT: 15 U/L (ref 14–54)
AST: 20 U/L (ref 15–41)
Anion gap: 10 (ref 5–15)
BUN: 19 mg/dL (ref 6–20)
CALCIUM: 10.3 mg/dL (ref 8.9–10.3)
CO2: 23 mmol/L (ref 22–32)
Chloride: 105 mmol/L (ref 101–111)
Creatinine, Ser: 1.01 mg/dL — ABNORMAL HIGH (ref 0.44–1.00)
GFR calc Af Amer: 60 mL/min — ABNORMAL LOW (ref >60)
GFR calc non Af Amer: 52 mL/min — ABNORMAL LOW (ref >60)
GLUCOSE: 112 mg/dL — AB (ref 65–99)
Potassium: 3.5 mmol/L (ref 3.5–5.1)
Sodium: 138 mmol/L (ref 135–145)
TOTAL PROTEIN: 7.8 g/dL (ref 6.5–8.1)
Total Bilirubin: 0.6 mg/dL (ref 0.3–1.2)

## 2017-04-15 LAB — URINALYSIS, ROUTINE W REFLEX MICROSCOPIC
BILIRUBIN URINE: NEGATIVE
Glucose, UA: NEGATIVE mg/dL
Hgb urine dipstick: NEGATIVE
KETONES UR: NEGATIVE mg/dL
Nitrite: NEGATIVE
PROTEIN: NEGATIVE mg/dL
SPECIFIC GRAVITY, URINE: 1.019 (ref 1.005–1.030)
pH: 5 (ref 5.0–8.0)

## 2017-04-15 LAB — PROTIME-INR
INR: 1.03
Prothrombin Time: 13.5 seconds (ref 11.4–15.2)

## 2017-04-15 LAB — SURGICAL PCR SCREEN
MRSA, PCR: NEGATIVE
STAPHYLOCOCCUS AUREUS: NEGATIVE

## 2017-04-15 LAB — APTT: aPTT: 26 seconds (ref 24–36)

## 2017-04-15 NOTE — Progress Notes (Signed)
positive antibodies redraw type and screen

## 2017-04-15 NOTE — Progress Notes (Addendum)
PCP - [facility] Cardiologist - Skeet Latch - clearance 03/03/17  Chest x-ray - 04/15/17 EKG - 03/03/17 Stress Test - denies ECHO - will have one after surgery Cardiac Cath - denies  Will start Eliquis after surgery   Patient denies shortness of breath, fever, cough and chest pain at PAT appointment   Patient verbalized understanding of instructions that were given to them at the PAT appointment. Patient was also instructed that they will need to review over the PAT instructions again at home before surgery.

## 2017-04-16 LAB — URINE CULTURE

## 2017-04-20 ENCOUNTER — Telehealth: Payer: Self-pay | Admitting: Cardiovascular Disease

## 2017-04-20 NOTE — Telephone Encounter (Signed)
New message      Pt c/o medication issue:  1. Name of Medication: aspirin  2. How are you currently taking this medication (dosage and times per day)? 81mg  daily  3. Are you having a reaction (difficulty breathing--STAT)? no 4. What is your medication issue? Pt is having surgery soon.  She states that Dr Oval Linsey said stop taking aspirin 5 days prior and the pre op nurse told her to stop taking aspirin 7 days prior.  Which is correct?

## 2017-04-20 NOTE — Telephone Encounter (Signed)
Pt of Dr. Oval Linsey - previously cleared for surgery w instructions given on ASA hold.  Advised pt a 5 day hold should be sufficient for reduction of bleed risk - explained ASA hold window is typically 5-7 days. Pt voiced agreement w & understanding of holding ASA 81mg  for 5 days prior to surgery, & will get instructions on resuming medication or starting new med following her surgery. Will call if further needs.

## 2017-04-25 MED ORDER — ACETAMINOPHEN 10 MG/ML IV SOLN
1000.0000 mg | INTRAVENOUS | Status: AC
Start: 2017-04-26 — End: 2017-04-26
  Administered 2017-04-26: 1000 mg via INTRAVENOUS
  Filled 2017-04-25: qty 100

## 2017-04-25 MED ORDER — CEFAZOLIN SODIUM-DEXTROSE 2-4 GM/100ML-% IV SOLN
2.0000 g | INTRAVENOUS | Status: AC
  Administered 2017-04-26: 2 g via INTRAVENOUS
  Filled 2017-04-25: qty 100

## 2017-04-25 MED ORDER — SODIUM CHLORIDE 0.9 % IV SOLN: INTRAVENOUS | Status: DC

## 2017-04-25 MED ORDER — TRANEXAMIC ACID 1000 MG/10ML IV SOLN
2000.0000 mg | INTRAVENOUS | Status: AC
  Administered 2017-04-26: 2000 mg via TOPICAL
  Filled 2017-04-25: qty 20

## 2017-04-25 NOTE — Anesthesia Preprocedure Evaluation (Signed)
Anesthesia Evaluation  Patient identified by MRN, date of birth, ID band Patient awake    Reviewed: Allergy & Precautions, H&P , Patient's Chart, lab work & pertinent test results, reviewed documented beta blocker date and time   Airway Mallampati: II  TM Distance: >3 FB Neck ROM: full    Dental no notable dental hx.    Pulmonary    Pulmonary exam normal breath sounds clear to auscultation       Cardiovascular Exercise Tolerance: Good hypertension,  Rhythm:regular Rate:Normal     Neuro/Psych    GI/Hepatic   Endo/Other    Renal/GU      Musculoskeletal   Abdominal   Peds  Hematology   Anesthesia Other Findings   Reproductive/Obstetrics                             Anesthesia Physical Anesthesia Plan  ASA: III  Anesthesia Plan: Spinal   Post-op Pain Management:    Induction:   PONV Risk Score and Plan:   Airway Management Planned:   Additional Equipment:   Intra-op Plan:   Post-operative Plan:   Informed Consent: I have reviewed the patients History and Physical, chart, labs and discussed the procedure including the risks, benefits and alternatives for the proposed anesthesia with the patient or authorized representative who has indicated his/her understanding and acceptance.   Dental Advisory Given  Plan Discussed with: CRNA and Surgeon  Anesthesia Plan Comments: (  )        Anesthesia Quick Evaluation

## 2017-04-26 ENCOUNTER — Inpatient Hospital Stay (HOSPITAL_COMMUNITY): Payer: PPO | Admitting: Certified Registered Nurse Anesthetist

## 2017-04-26 ENCOUNTER — Inpatient Hospital Stay (HOSPITAL_COMMUNITY): Payer: PPO | Admitting: Emergency Medicine

## 2017-04-26 ENCOUNTER — Encounter (HOSPITAL_COMMUNITY): Payer: Self-pay

## 2017-04-26 ENCOUNTER — Encounter (HOSPITAL_COMMUNITY)
Admission: RE | Disposition: A | Discharge status: Skilled Nursing Facility | Payer: Self-pay | Source: Ambulatory Visit | Attending: Orthopaedic Surgery

## 2017-04-26 ENCOUNTER — Inpatient Hospital Stay (HOSPITAL_COMMUNITY)
Admission: RE | Admit: 2017-04-26 | Discharge: 2017-04-28 | DRG: 470 | Disposition: A | Discharge status: Skilled Nursing Facility | Payer: PPO | Source: Ambulatory Visit | Attending: Orthopaedic Surgery | Admitting: Orthopaedic Surgery

## 2017-04-26 DIAGNOSIS — I10 Essential (primary) hypertension: Secondary | ICD-10-CM | POA: Diagnosis not present

## 2017-04-26 DIAGNOSIS — Z471 Aftercare following joint replacement surgery: Secondary | ICD-10-CM | POA: Diagnosis not present

## 2017-04-26 DIAGNOSIS — Z79899 Other long term (current) drug therapy: Secondary | ICD-10-CM

## 2017-04-26 DIAGNOSIS — M21162 Varus deformity, not elsewhere classified, left knee: Secondary | ICD-10-CM | POA: Diagnosis not present

## 2017-04-26 DIAGNOSIS — M1712 Unilateral primary osteoarthritis, left knee: Secondary | ICD-10-CM | POA: Diagnosis not present

## 2017-04-26 DIAGNOSIS — M25562 Pain in left knee: Secondary | ICD-10-CM | POA: Diagnosis present

## 2017-04-26 DIAGNOSIS — R2681 Unsteadiness on feet: Secondary | ICD-10-CM | POA: Diagnosis not present

## 2017-04-26 DIAGNOSIS — E669 Obesity, unspecified: Secondary | ICD-10-CM | POA: Diagnosis present

## 2017-04-26 DIAGNOSIS — S8002XA Contusion of left knee, initial encounter: Secondary | ICD-10-CM | POA: Diagnosis not present

## 2017-04-26 DIAGNOSIS — M6281 Muscle weakness (generalized): Secondary | ICD-10-CM | POA: Diagnosis not present

## 2017-04-26 DIAGNOSIS — R9431 Abnormal electrocardiogram [ECG] [EKG]: Secondary | ICD-10-CM

## 2017-04-26 DIAGNOSIS — Z6829 Body mass index (BMI) 29.0-29.9, adult: Secondary | ICD-10-CM

## 2017-04-26 DIAGNOSIS — Z7982 Long term (current) use of aspirin: Secondary | ICD-10-CM

## 2017-04-26 DIAGNOSIS — Z96652 Presence of left artificial knee joint: Secondary | ICD-10-CM | POA: Diagnosis not present

## 2017-04-26 DIAGNOSIS — E785 Hyperlipidemia, unspecified: Secondary | ICD-10-CM | POA: Diagnosis present

## 2017-04-26 DIAGNOSIS — G8918 Other acute postprocedural pain: Secondary | ICD-10-CM | POA: Diagnosis not present

## 2017-04-26 HISTORY — PX: TOTAL KNEE ARTHROPLASTY: SHX125

## 2017-04-26 HISTORY — DX: Personal history of other medical treatment: Z92.89

## 2017-04-26 LAB — TYPE AND SCREEN
ABO/RH(D): A POS
Antibody Screen: POSITIVE

## 2017-04-26 SURGERY — ARTHROPLASTY, KNEE, TOTAL
Anesthesia: Spinal | Site: Knee | Laterality: Left

## 2017-04-26 MED ORDER — DIPHENHYDRAMINE HCL 12.5 MG/5ML PO ELIX: 12.5000 mg | ORAL_SOLUTION | ORAL | Status: DC | PRN

## 2017-04-26 MED ORDER — PROPOFOL 500 MG/50ML IV EMUL
INTRAVENOUS | Status: DC | PRN
  Administered 2017-04-26: 75 ug/kg/min via INTRAVENOUS

## 2017-04-26 MED ORDER — FENTANYL CITRATE (PF) 100 MCG/2ML IJ SOLN
INTRAMUSCULAR | Status: DC | PRN
  Administered 2017-04-26: 50 ug via INTRAVENOUS
  Administered 2017-04-26: 25 ug via INTRAVENOUS

## 2017-04-26 MED ORDER — ATORVASTATIN CALCIUM 20 MG PO TABS
20.0000 mg | ORAL_TABLET | Freq: Every day | ORAL | Status: DC
  Administered 2017-04-26 – 2017-04-28 (×3): 20 mg via ORAL
  Filled 2017-04-26 (×3): qty 1

## 2017-04-26 MED ORDER — CEFAZOLIN SODIUM-DEXTROSE 2-4 GM/100ML-% IV SOLN
2.0000 g | Freq: Four times a day (QID) | INTRAVENOUS | Status: DC
  Filled 2017-04-26 (×2): qty 100

## 2017-04-26 MED ORDER — ENSURE ENLIVE PO LIQD
237.0000 mL | Freq: Two times a day (BID) | ORAL | Status: DC
  Administered 2017-04-27: 237 mL via ORAL

## 2017-04-26 MED ORDER — ASPIRIN EC 81 MG PO TBEC: 81.0000 mg | DELAYED_RELEASE_TABLET | Freq: Every day | ORAL | Status: DC

## 2017-04-26 MED ORDER — FENTANYL CITRATE (PF) 100 MCG/2ML IJ SOLN
INTRAMUSCULAR | Status: AC
  Filled 2017-04-26: qty 2

## 2017-04-26 MED ORDER — MAGNESIUM CITRATE PO SOLN: 1.0000 | Freq: Once | ORAL | Status: DC | PRN

## 2017-04-26 MED ORDER — VITAMIN D3 25 MCG (1000 UNIT) PO TABS
2000.0000 [IU] | ORAL_TABLET | Freq: Every day | ORAL | Status: DC
  Administered 2017-04-26 – 2017-04-28 (×3): 2000 [IU] via ORAL
  Filled 2017-04-26 (×6): qty 2

## 2017-04-26 MED ORDER — FENTANYL CITRATE (PF) 100 MCG/2ML IJ SOLN
25.0000 ug | INTRAMUSCULAR | Status: DC | PRN
  Administered 2017-04-26 (×3): 50 ug via INTRAVENOUS

## 2017-04-26 MED ORDER — LIDOCAINE HCL (CARDIAC) 20 MG/ML IV SOLN
INTRAVENOUS | Status: AC
  Filled 2017-04-26: qty 5

## 2017-04-26 MED ORDER — PHENYLEPHRINE 40 MCG/ML (10ML) SYRINGE FOR IV PUSH (FOR BLOOD PRESSURE SUPPORT)
PREFILLED_SYRINGE | INTRAVENOUS | Status: AC
  Filled 2017-04-26: qty 10

## 2017-04-26 MED ORDER — CHLORHEXIDINE GLUCONATE 4 % EX LIQD: 60.0000 mL | Freq: Once | CUTANEOUS | Status: DC

## 2017-04-26 MED ORDER — DOCUSATE SODIUM 100 MG PO CAPS
100.0000 mg | ORAL_CAPSULE | Freq: Two times a day (BID) | ORAL | Status: DC
  Administered 2017-04-26 – 2017-04-28 (×5): 100 mg via ORAL
  Filled 2017-04-26 (×6): qty 1

## 2017-04-26 MED ORDER — ONDANSETRON HCL 4 MG PO TABS: 4.0000 mg | ORAL_TABLET | Freq: Four times a day (QID) | ORAL | Status: DC | PRN

## 2017-04-26 MED ORDER — DEXTROSE 5 % IV SOLN
500.0000 mg | Freq: Four times a day (QID) | INTRAVENOUS | Status: DC | PRN
  Filled 2017-04-26: qty 5

## 2017-04-26 MED ORDER — AMLODIPINE BESYLATE 5 MG PO TABS
5.0000 mg | ORAL_TABLET | Freq: Every day | ORAL | Status: DC
  Administered 2017-04-27 – 2017-04-28 (×2): 5 mg via ORAL
  Filled 2017-04-26 (×2): qty 1

## 2017-04-26 MED ORDER — METOCLOPRAMIDE HCL 5 MG PO TABS: 5.0000 mg | ORAL_TABLET | Freq: Three times a day (TID) | ORAL | Status: DC | PRN

## 2017-04-26 MED ORDER — POTASSIUM CHLORIDE ER 10 MEQ PO TBCR
10.0000 meq | EXTENDED_RELEASE_TABLET | Freq: Every day | ORAL | Status: DC
  Administered 2017-04-26 – 2017-04-28 (×3): 10 meq via ORAL
  Filled 2017-04-26 (×5): qty 1

## 2017-04-26 MED ORDER — METOCLOPRAMIDE HCL 5 MG/ML IJ SOLN: 5.0000 mg | Freq: Three times a day (TID) | INTRAMUSCULAR | Status: DC | PRN

## 2017-04-26 MED ORDER — HYDROMORPHONE HCL 1 MG/ML IJ SOLN: 0.5000 mg | INTRAMUSCULAR | Status: DC | PRN

## 2017-04-26 MED ORDER — FENTANYL CITRATE (PF) 250 MCG/5ML IJ SOLN
INTRAMUSCULAR | Status: AC
  Filled 2017-04-26: qty 5

## 2017-04-26 MED ORDER — ONDANSETRON HCL 4 MG/2ML IJ SOLN: 4.0000 mg | Freq: Four times a day (QID) | INTRAMUSCULAR | Status: DC | PRN

## 2017-04-26 MED ORDER — PHENYLEPHRINE HCL 10 MG/ML IJ SOLN
INTRAVENOUS | Status: DC | PRN
  Administered 2017-04-26: 25 ug/min via INTRAVENOUS

## 2017-04-26 MED ORDER — SODIUM CHLORIDE 0.9 % IV SOLN
INTRAVENOUS | Status: DC
  Administered 2017-04-26: 16:00:00 via INTRAVENOUS

## 2017-04-26 MED ORDER — LACTATED RINGERS IV SOLN
INTRAVENOUS | Status: DC | PRN
  Administered 2017-04-26 (×2): via INTRAVENOUS

## 2017-04-26 MED ORDER — CEFAZOLIN SODIUM-DEXTROSE 2-4 GM/100ML-% IV SOLN
2.0000 g | Freq: Four times a day (QID) | INTRAVENOUS | Status: AC
  Administered 2017-04-26 (×2): 2 g via INTRAVENOUS
  Filled 2017-04-26 (×2): qty 100

## 2017-04-26 MED ORDER — OXYCODONE HCL 5 MG PO TABS
5.0000 mg | ORAL_TABLET | ORAL | Status: DC | PRN
  Administered 2017-04-26 – 2017-04-28 (×12): 10 mg via ORAL
  Filled 2017-04-26 (×12): qty 2

## 2017-04-26 MED ORDER — POLYETHYLENE GLYCOL 3350 17 G PO PACK: 17.0000 g | PACK | Freq: Every day | ORAL | Status: DC | PRN

## 2017-04-26 MED ORDER — SODIUM CHLORIDE 0.9 % IR SOLN
Status: DC | PRN
  Administered 2017-04-26: 3000 mL

## 2017-04-26 MED ORDER — ONDANSETRON HCL 4 MG/2ML IJ SOLN
INTRAMUSCULAR | Status: AC
  Filled 2017-04-26: qty 2

## 2017-04-26 MED ORDER — BUPIVACAINE-EPINEPHRINE 0.25% -1:200000 IJ SOLN
INTRAMUSCULAR | Status: DC | PRN
  Administered 2017-04-26: 30 mL

## 2017-04-26 MED ORDER — MIDAZOLAM HCL 5 MG/5ML IJ SOLN
INTRAMUSCULAR | Status: DC | PRN
  Administered 2017-04-26 (×2): 1 mg via INTRAVENOUS

## 2017-04-26 MED ORDER — MENTHOL 3 MG MT LOZG: 1.0000 | LOZENGE | OROMUCOSAL | Status: DC | PRN

## 2017-04-26 MED ORDER — METHOCARBAMOL 500 MG PO TABS
500.0000 mg | ORAL_TABLET | Freq: Four times a day (QID) | ORAL | Status: DC | PRN
  Administered 2017-04-27 – 2017-04-28 (×4): 500 mg via ORAL
  Filled 2017-04-26 (×4): qty 1

## 2017-04-26 MED ORDER — BUPIVACAINE-EPINEPHRINE 0.25% -1:200000 IJ SOLN
INTRAMUSCULAR | Status: AC
  Filled 2017-04-26: qty 1

## 2017-04-26 MED ORDER — PROPOFOL 10 MG/ML IV BOLUS
INTRAVENOUS | Status: DC | PRN
  Administered 2017-04-26: 20 mg via INTRAVENOUS

## 2017-04-26 MED ORDER — LACTATED RINGERS IV SOLN: INTRAVENOUS | Status: DC

## 2017-04-26 MED ORDER — OXYCODONE HCL 5 MG PO TABS
ORAL_TABLET | ORAL | Status: AC
  Administered 2017-04-26: 10 mg via ORAL
  Filled 2017-04-26: qty 2

## 2017-04-26 MED ORDER — PHENYLEPHRINE HCL 10 MG/ML IJ SOLN
INTRAMUSCULAR | Status: DC | PRN
  Administered 2017-04-26 (×3): 80 ug via INTRAVENOUS

## 2017-04-26 MED ORDER — PHENOL 1.4 % MT LIQD: 1.0000 | OROMUCOSAL | Status: DC | PRN

## 2017-04-26 MED ORDER — FENTANYL CITRATE (PF) 100 MCG/2ML IJ SOLN
INTRAMUSCULAR | Status: AC
  Administered 2017-04-26: 50 ug via INTRAVENOUS
  Filled 2017-04-26: qty 2

## 2017-04-26 MED ORDER — ALUM & MAG HYDROXIDE-SIMETH 200-200-20 MG/5ML PO SUSP: 30.0000 mL | ORAL | Status: DC | PRN

## 2017-04-26 MED ORDER — BISACODYL 10 MG RE SUPP: 10.0000 mg | Freq: Every day | RECTAL | Status: DC | PRN

## 2017-04-26 MED ORDER — ASPIRIN EC 325 MG PO TBEC
325.0000 mg | DELAYED_RELEASE_TABLET | Freq: Two times a day (BID) | ORAL | Status: DC
  Administered 2017-04-27 – 2017-04-28 (×4): 325 mg via ORAL
  Filled 2017-04-26 (×4): qty 1

## 2017-04-26 MED ORDER — PROPOFOL 10 MG/ML IV BOLUS
INTRAVENOUS | Status: AC
  Filled 2017-04-26: qty 20

## 2017-04-26 MED ORDER — ASPIRIN 81 MG PO CHEW: 81.0000 mg | CHEWABLE_TABLET | Freq: Two times a day (BID) | ORAL | Status: DC

## 2017-04-26 MED ORDER — LIDOCAINE 2% (20 MG/ML) 5 ML SYRINGE
INTRAMUSCULAR | Status: DC | PRN
  Administered 2017-04-26: 40 mg via INTRAVENOUS

## 2017-04-26 MED ORDER — ACETAMINOPHEN 10 MG/ML IV SOLN
1000.0000 mg | Freq: Four times a day (QID) | INTRAVENOUS | Status: DC
  Administered 2017-04-26 – 2017-04-27 (×3): 1000 mg via INTRAVENOUS
  Filled 2017-04-26 (×4): qty 100

## 2017-04-26 MED ORDER — 0.9 % SODIUM CHLORIDE (POUR BTL) OPTIME
TOPICAL | Status: DC | PRN
  Administered 2017-04-26: 1000 mL

## 2017-04-26 MED ORDER — ONDANSETRON HCL 4 MG/2ML IJ SOLN
INTRAMUSCULAR | Status: DC | PRN
  Administered 2017-04-26: 4 mg via INTRAVENOUS

## 2017-04-26 MED ORDER — MIDAZOLAM HCL 2 MG/2ML IJ SOLN
INTRAMUSCULAR | Status: AC
  Filled 2017-04-26: qty 2

## 2017-04-26 MED ORDER — APIXABAN 5 MG PO TABS: 5.0000 mg | ORAL_TABLET | Freq: Two times a day (BID) | ORAL | Status: DC

## 2017-04-26 SURGICAL SUPPLY — 60 items
BAG DECANTER FOR FLEXI CONT (MISCELLANEOUS) ×3 IMPLANT
BANDAGE ESMARK 6X9 LF (GAUZE/BANDAGES/DRESSINGS) ×1 IMPLANT
BLADE SAGITTAL 25.0X1.19X90 (BLADE) ×2 IMPLANT
BLADE SAGITTAL 25.0X1.19X90MM (BLADE) ×1
BNDG ESMARK 6X9 LF (GAUZE/BANDAGES/DRESSINGS) ×3
BOWL SMART MIX CTS (DISPOSABLE) ×3 IMPLANT
CAP KNEE TOTAL 3 SIGMA ×3 IMPLANT
CEMENT HV SMART SET (Cement) ×6 IMPLANT
COVER SURGICAL LIGHT HANDLE (MISCELLANEOUS) ×3 IMPLANT
CUFF TOURNIQUET SINGLE 34IN LL (TOURNIQUET CUFF) IMPLANT
CUFF TOURNIQUET SINGLE 44IN (TOURNIQUET CUFF) IMPLANT
DECANTER SPIKE VIAL GLASS SM (MISCELLANEOUS) IMPLANT
DRAPE EXTREMITY T 121X128X90 (DRAPE) IMPLANT
DRAPE HALF SHEET 40X57 (DRAPES) ×3 IMPLANT
DRSG ADAPTIC 3X8 NADH LF (GAUZE/BANDAGES/DRESSINGS) ×3 IMPLANT
DRSG PAD ABDOMINAL 8X10 ST (GAUZE/BANDAGES/DRESSINGS) ×6 IMPLANT
DURAPREP 26ML APPLICATOR (WOUND CARE) ×6 IMPLANT
ELECT CAUTERY BLADE 6.4 (BLADE) ×6 IMPLANT
ELECT REM PT RETURN 9FT ADLT (ELECTROSURGICAL) ×3
ELECTRODE REM PT RTRN 9FT ADLT (ELECTROSURGICAL) ×1 IMPLANT
EVACUATOR 1/8 PVC DRAIN (DRAIN) ×3 IMPLANT
FACESHIELD WRAPAROUND (MASK) ×6 IMPLANT
GAUZE SPONGE 4X4 12PLY STRL (GAUZE/BANDAGES/DRESSINGS) ×3 IMPLANT
GAUZE SPONGE 4X4 16PLY XRAY LF (GAUZE/BANDAGES/DRESSINGS) ×3 IMPLANT
GLOVE BIOGEL PI IND STRL 8 (GLOVE) ×1 IMPLANT
GLOVE BIOGEL PI IND STRL 8.5 (GLOVE) ×1 IMPLANT
GLOVE BIOGEL PI INDICATOR 8 (GLOVE) ×2
GLOVE BIOGEL PI INDICATOR 8.5 (GLOVE) ×2
GLOVE ECLIPSE 8.0 STRL XLNG CF (GLOVE) ×6 IMPLANT
GLOVE SURG ORTHO 8.5 STRL (GLOVE) ×6 IMPLANT
GOWN STRL REUS W/ TWL LRG LVL3 (GOWN DISPOSABLE) ×2 IMPLANT
GOWN STRL REUS W/TWL 2XL LVL3 (GOWN DISPOSABLE) ×3 IMPLANT
GOWN STRL REUS W/TWL LRG LVL3 (GOWN DISPOSABLE) ×4
HANDPIECE INTERPULSE COAX TIP (DISPOSABLE) ×2
KIT BASIN OR (CUSTOM PROCEDURE TRAY) ×3 IMPLANT
KIT ROOM TURNOVER OR (KITS) ×3 IMPLANT
MANIFOLD NEPTUNE II (INSTRUMENTS) ×3 IMPLANT
NEEDLE 22X1 1/2 (OR ONLY) (NEEDLE) ×3 IMPLANT
NS IRRIG 1000ML POUR BTL (IV SOLUTION) ×3 IMPLANT
PACK TOTAL JOINT (CUSTOM PROCEDURE TRAY) ×3 IMPLANT
PAD ABD 8X10 STRL (GAUZE/BANDAGES/DRESSINGS) ×3 IMPLANT
PAD ARMBOARD 7.5X6 YLW CONV (MISCELLANEOUS) ×6 IMPLANT
PAD CAST 4YDX4 CTTN HI CHSV (CAST SUPPLIES) ×1 IMPLANT
PADDING CAST COTTON 4X4 STRL (CAST SUPPLIES) ×2
PADDING CAST COTTON 6X4 STRL (CAST SUPPLIES) ×3 IMPLANT
SET HNDPC FAN SPRY TIP SCT (DISPOSABLE) ×1 IMPLANT
STAPLER VISISTAT 35W (STAPLE) ×3 IMPLANT
SUCTION FRAZIER HANDLE 10FR (MISCELLANEOUS) ×2
SUCTION TUBE FRAZIER 10FR DISP (MISCELLANEOUS) ×1 IMPLANT
SURGIFLO W/THROMBIN 8M KIT (HEMOSTASIS) IMPLANT
SUT BONE WAX W31G (SUTURE) ×3 IMPLANT
SUT ETHIBOND NAB CT1 #1 30IN (SUTURE) ×6 IMPLANT
SUT MNCRL AB 3-0 PS2 18 (SUTURE) ×3 IMPLANT
SUT VIC AB 0 CT1 27 (SUTURE) ×2
SUT VIC AB 0 CT1 27XBRD ANBCTR (SUTURE) ×1 IMPLANT
SYR CONTROL 10ML LL (SYRINGE) IMPLANT
TOWEL OR 17X24 6PK STRL BLUE (TOWEL DISPOSABLE) ×3 IMPLANT
TOWEL OR 17X26 10 PK STRL BLUE (TOWEL DISPOSABLE) ×3 IMPLANT
TRAY FOLEY BAG SILVER LF 16FR (SET/KITS/TRAYS/PACK) ×3 IMPLANT
WRAP KNEE MAXI GEL POST OP (GAUZE/BANDAGES/DRESSINGS) ×3 IMPLANT

## 2017-04-26 NOTE — OR Nursing (Signed)
Pt placed both upper and lower dentures in her mouth.

## 2017-04-26 NOTE — Op Note (Signed)
NAMEIOMA, CHISMAR               ACCOUNT NO.:  [id number]  MEDICAL RECORD NO.:  [id number]  LOCATION:  [facility]                         FACILITY:  [facility]  PHYSICIAN:  Vonna Kotyk. Garen Woolbright, M.D.DATE OF BIRTH:  [birth year]  DATE OF PROCEDURE:  04/26/2017 DATE OF DISCHARGE:                              OPERATIVE REPORT   PREOPERATIVE DIAGNOSE:  End-stage osteoarthritis, left knee.  POSTOPERATIVE DIAGNOSIS:  End-stage osteoarthritis, left knee.  PROCEDURE:  Left total knee replacement.  SURGEON:  Vonna Kotyk. Durward Fortes, M.D.  ASSISTANT:  [name], PA-C, was present throughout the operative procedure to ensure its timely completion.  ANESTHESIA:  Spinal, supplemental adductor canal block, and IV sedation.  COMPLICATIONS:  None.  COMPONENTS:  DePuy LCS standard femoral component, a 12.5-mm polyethylene bridging bearing, a #4 rotating keeled tibial tray, a 3-peg metal back rotating patella.  Components were secured with polymethyl methacrylate.  DESCRIPTION OF PROCEDURE:  Ms. Guedea was met in the holding area, identified the left knee as appropriate operative site and marked it accordingly.  Anesthesia performed an adductor canal block.  The patient was then transported to room #7.  Spinal anesthetic was administered.  A Foley catheter was inserted.  Urine was clear.  The left lower extremity was then placed in a thigh tourniquet.  The extremity was then prepped with chlorhexidine scrub and DuraPrep x2 from the tourniquet to the tips of the toes.  Sterile draping was performed. Time-out was called.  With the extremity elevated, was Esmarch exsanguinated with a proximal tourniquet at 350 mmHg.  A midline longitudinal incision was made, centered about the patella, extending from the superior pouch to tibial tubercle and via sharp dissection, the incision was carried down to subcutaneous tissue.  First layer of capsule was incised in the midline.  A medial parapatellar incision was  then made with a Bovie.  The joint was entered.  There was a clear yellow joint effusion of about 20 mL.  The patella was everted to 180 degrees, knee flexed to 90 degrees. There was complete absence of articular cartilage in the patella with large peripheral osteophytes.  There were large osteophytes along the medial and lateral femoral condyle, little if any cartilage remaining on either of the medial or lateral compartment.  There was a fixed varus position and I did a medial release.  I then could correct the knee to neutral.  I measured a standard femoral component.  The first bony cut was made transversely in the proximal tibia with a 7- degree angle of declination.  With each bony cut, I checked to be sure that our alignment was appropriate with the external guide. Technically, the procedure was difficult as there were large osteophytes along the posterior tibial plateau and femoral condyle and numerous loose bodies, all of which were adhered to the synovium. Preoperatively, there was barely 90 degrees of flexion.  Care was taken to remove the osteophytes and optimize motion.  Cuts were then made on the femur using the standard femoral jig.  I used a 4-degree distal femoral valgus cut.  Flexion and extension gaps were symmetrical at 12.5 mm.  MCL and LCL remained intact.  Lamina spreaders were inserted at the  medial and lateral compartments, so I could remove any further osteophytes, loose bodies, medial and lateral menisci, and anterior cruciate ligament and posterior cruciate ligament.  I also removed large osteophytes and posterior femoral condyle with a curved three-quarter-inch osteotome.  Final bony cut was then made on the femur for tapering purposes in the center hole.  Retractors were placed about the tibia, was advanced anteriorly and measured a #4 tibial tray.  This was pinned in place.  Center hole was made followed by the keeled cut.  With the jig in place, the  12.5-mm bridging bearing was applied followed by the trial standard femoral component.  The entire construct was reduced and through a full range of motion, it remained perfectly stable.  We had normal alignment, no opening with varus or valgus stress.  The patella was prepared by removing 10 mm of bone, leaving 14 mm patella thickness.  Three holes were made.  Trial patella inserted and reduced, through a full range of motion remained stable.  Trial components were then removed.  The joint was copiously irrigated with saline solution.  The final components were then impacted with polymethyl methacrylate. We initially inserted the tibia followed by the 12.5-mm polyethylene bridging bearing and then, the femur.  The joint was reduced with compression.  Extraneous methacrylate was removed from the periphery of the components.  Patella was applied with methacrylate and a patellar clamp.  At approximately 16 minutes, the methacrylate had matured.  We then inspected the joint without any further extraneous methacrylate.  We injected the joint with 0.25% Marcaine with epinephrine.  Tourniquet was deflated at approximately 86 minutes.  Bleeding was controlled with the Bovie and topical tranexamic acid.  I did insert a medium-sized Hemovac to the lateral compartment.  The deep capsule was then closed with a running #1 Ethibond, superficial capsule with running 0 Vicryl, subcu with 2-0 Vicryl and 3-0 Monocryl. Skin closed with skin clips.  Sterile bulky dressing was applied followed by the patient's support stocking.  The patient tolerated the procedure without complications.     Vonna Kotyk. Durward Fortes, M.D.     PWW/MEDQ  D:  04/26/2017  T:  04/26/2017  Job:  [id number]

## 2017-04-26 NOTE — H&P (Signed)
The recent History & Physical has been reviewed. I have personally examined the patient today. There is no interval change to the documented History & Physical. The patient would like to proceed with the procedure.  [name] 04/26/2017,  7:05 AM

## 2017-04-26 NOTE — Transfer of Care (Signed)
Immediate Anesthesia Transfer of Care Note  Patient: Lauren Richard  Procedure(s) Performed: Procedure(s): LEFT TOTAL KNEE ARTHROPLASTY (Left)  Patient Location: PACU  Anesthesia Type:Spinal and MAC combined with regional for post-op pain  Level of Consciousness: awake, alert  and oriented  Airway & Oxygen Therapy: Patient Spontanous Breathing and Patient connected to nasal cannula oxygen  Post-op Assessment: Report given to RN and Post -op Vital signs reviewed and stable  Post vital signs: Reviewed and stable  Last Vitals:  Vitals:   04/26/17 0704 04/26/17 0705  BP:    Pulse: 92 93  Resp: (!) 23   Temp:      Last Pain:  Vitals:   04/26/17 0606  TempSrc: Oral  PainSc: 3       Patients Stated Pain Goal: 2 (19/37/90 2409)  Complications: No apparent anesthesia complications

## 2017-04-26 NOTE — Anesthesia Postprocedure Evaluation (Signed)
Anesthesia Post Note  Patient: Lauren Richard  Procedure(s) Performed: Procedure(s) (LRB): LEFT TOTAL KNEE ARTHROPLASTY (Left)     Patient location during evaluation: PACU Anesthesia Type: Spinal Level of consciousness: awake Pain management: satisfactory to patient Vital Signs Assessment: post-procedure vital signs reviewed and stable Respiratory status: spontaneous breathing Cardiovascular status: blood pressure returned to baseline Postop Assessment: no headache and spinal receding Anesthetic complications: no    Last Vitals:  Vitals:   04/26/17 1120 04/26/17 1130  BP: (!) 136/59   Pulse: 87 91  Resp: 17 15  Temp:  (!) 36.3 C    Last Pain:  Vitals:   04/26/17 1100  TempSrc:   PainSc: 3                  Kaysey Berndt EDWARD

## 2017-04-26 NOTE — Anesthesia Procedure Notes (Signed)
Spinal  Patient location during procedure: OR Staffing Anesthesiologist: Lyndle Herrlich Preanesthetic Checklist Completed: patient identified, site marked, surgical consent, pre-op evaluation, timeout performed, IV checked, risks and benefits discussed and monitors and equipment checked Spinal Block Patient position: sitting Prep: DuraPrep Patient monitoring: heart rate, cardiac monitor, continuous pulse ox and blood pressure Approach: midline Location: L3-4 Injection technique: single-shot Needle Needle type: Sprotte  Needle gauge: 24 G Needle length: 9 cm Assessment Sensory level: T4 Additional Notes Spinal Dosage in OR  .75% Bupivicaine ml       1.7 LLD x 3 min

## 2017-04-26 NOTE — Anesthesia Procedure Notes (Signed)
Procedure Name: MAC Date/Time: 04/26/2017 7:15 AM Performed by: Candis Shine Pre-anesthesia Checklist: Patient identified, Emergency Drugs available, Suction available, Patient being monitored and Timeout performed Patient Re-evaluated:Patient Re-evaluated prior to induction Oxygen Delivery Method: Simple face mask Dental Injury: Teeth and Oropharynx as per pre-operative assessment

## 2017-04-26 NOTE — Anesthesia Procedure Notes (Signed)
Anesthesia Regional Block: Adductor canal block   Pre-Anesthetic Checklist: ,, timeout performed, Correct Patient, Correct Site, Correct Laterality, Correct Procedure, Correct Position, site marked, Risks and benefits discussed, pre-op evaluation,  At surgeon's request and post-op pain management  Laterality: Left  Prep: chloraprep       Needles:   Needle Type: Echogenic Needle     Needle Length: 9cm  Needle Gauge: 21     Additional Needles:   Narrative:  Start time: 04/26/2017 6:52 AM End time: 04/26/2017 7:00 AM Injection made incrementally with aspirations every 5 mL. Anesthesiologist: Lyndle Herrlich

## 2017-04-26 NOTE — OR Nursing (Signed)
Pt was moving for many of her BPs. Errant readings of 230s/200s due to movement.  Her BP when still and accurate is running 130s/50s NIBP.  Pt is only briefly complaining of pain at the top of her CPM rotation.  Her pain subsides as soon as the CPM moves back to extension.

## 2017-04-26 NOTE — Op Note (Signed)
PATIENT ID:      Lauren Richard  MRN:     [id number] DOB/AGE:    [birth year] / 81 y.o.      OPERATIVE REPORT    DATE OF PROCEDURE:  04/26/2017       PREOPERATIVE DIAGNOSIS:END STAGE   LEFT KNEE OSTEOARTHRITIS                                                       Estimated body mass index is 29.6 kg/m as calculated from the following:   Height as of this encounter: 5\' 7"  (1.702 m).   Weight as of this encounter: 189 lb (85.7 kg).     POSTOPERATIVE DIAGNOSIS:END STAGE   LEFT KNEE OSTEOARTHRITIS                                                                     Estimated body mass index is 29.6 kg/m as calculated from the following:   Height as of this encounter: 5\' 7"  (1.702 m).   Weight as of this encounter: 189 lb (85.7 kg).     PROCEDURE:  Procedure(s): LEFT TOTAL KNEE ARTHROPLASTY     SURGEON:  [name], MD    ASSISTANT:   [name], PA-C   (Present and scrubbed throughout the case, critical for assistance with exposure, retraction, instrumentation, and closure.)          ANESTHESIA: regional, spinal and IV sedation     DRAINS: HEMOVAC DRAIN IN RIGHT KNEE-CLAMPED   TOURNIQUET TIME:     COMPLICATIONS:  None   CONDITION:  stable  PROCEDURE IN DETAIL: [id number]   Lauren Richard 04/26/2017, 9:27 AM

## 2017-04-26 NOTE — Progress Notes (Signed)
Orthopedic Tech Progress Note Patient Details:  Lauren Richard [birth year] [id number]  CPM Left Knee CPM Left Knee: On Left Knee Flexion (Degrees): 90 Left Knee Extension (Degrees): 0 Additional Comments: trapeze bar patient helper   Hildred Priest 04/26/2017, 10:58 AM Viewed order from doctor's order list

## 2017-04-26 NOTE — Consult Note (Signed)
Cardiology Consultation:   Patient ID: Lauren Richard; [id number]; 1935/10/19   Admit date: 04/26/2017 Date of Consult: 04/26/2017  Primary Care Provider: Glendale Chard, MD Primary Cardiologist: Dr. Oval Linsey Primary Electrophysiologist:  None   Patient Profile:   Lauren Richard is a 81 y.o. female with a hx of Hypertension, hyperlipidemia, arthritis, obesity.  The patient is being seen today for the evaluation of atrial fibrillation and starting anticoagulation at the request of Dr. Durward Fortes.  History of Present Illness:   Lauren Richard was seen by Dr. Oval Linsey in office on 03/03/2017 for evaluation of pre-surgical risk for knee replacement. She was referred to the cardiology office by Minette Brine, [place].  Dr. Durward Fortes referred her to Dr.  Baird Cancer for pre-op clearance. At this appointment she was found to be in atrial fibrillation heart rate 60s on EKG. She was started on 81 mg aspirin daily and referred to cardiology. She has been asymptomatic. She had decreased mobility due to knee pain. A TSH and echocardiogram were ordered but these were never completed. Dr. Blenda Mounts plan is to start Eliquis 5mg  bid after surgery once the atrial fibrillation was verified. On reviewing the EKG logged in [place] on 01/24/2017 the rhythm is irregular (PACs) but I do see p-waves in at least III and V2, the EKG machine has read it as atrial fibrillation. EKG in office on 03/03/2017 shows sinus tachycardia.  She had knee replacement  Surgery today and is in recovery on the post op floor. She is not on telemetry. No complications during surgery noted per chart review.      Past Medical History:  Diagnosis Date  . Arthritis   . Essential hypertension 03/03/2017  . Hyperlipidemia   . Obesity 03/03/2017  . Osteoarthritis 03/03/2017    Past Surgical History:  Procedure Laterality Date  . FOOT SURGERY    . REDUCTION MAMMAPLASTY Bilateral 1980  . TOTAL ABDOMINAL HYSTERECTOMY W/ BILATERAL  SALPINGOOPHORECTOMY  1990's     Inpatient Medications: Scheduled Meds: . [START ON 04/27/2017] amLODipine  5 mg Oral Daily  . aspirin EC  81 mg Oral Daily  . atorvastatin  20 mg Oral Daily  . cholecalciferol  2,000 Units Oral Daily  . docusate sodium  100 mg Oral BID  . fentaNYL      . potassium chloride  10 mEq Oral Daily   Continuous Infusions: . sodium chloride    . acetaminophen    .  ceFAZolin (ANCEF) IV 2 g (04/26/17 1255)  . methocarbamol (ROBAXIN)  IV     PRN Meds: alum & mag hydroxide-simeth, bisacodyl, diphenhydrAMINE, HYDROmorphone (DILAUDID) injection, magnesium citrate, menthol-cetylpyridinium **OR** phenol, methocarbamol **OR** methocarbamol (ROBAXIN)  IV, metoCLOPramide **OR** metoCLOPramide (REGLAN) injection, ondansetron **OR** ondansetron (ZOFRAN) IV, oxyCODONE, polyethylene glycol  Allergies:    Allergies  Allergen Reactions  . No Known Allergies     Social History:   Social History   Social History  . Marital status: Widowed    Spouse name: N/A  . Number of children: N/A  . Years of education: N/A   Occupational History  . Not on file.   Social History Main Topics  . Smoking status: Never Smoker  . Smokeless tobacco: Never Used  . Alcohol use 0.0 oz/week     Comment: rarely  . Drug use: No  . Sexual activity: Not Currently   Other Topics Concern  . Not on file   Social History Narrative  . No narrative on file      Family  History:  The patient's family history includes Asthma in her sister; Atrial fibrillation in her sister; Congestive Heart Failure in her sister; Diabetes in her sister; Hypertension in her sister; Kidney cancer in her mother; Lung cancer in her father.  ROS:  Please see the history of present illness.  All other ROS reviewed and negative.     Physical Exam/Data:   Vitals:   04/26/17 1115 04/26/17 1120 04/26/17 1130 04/26/17 1212  BP:  (!) 136/59  (!) 145/76  Pulse: 90 87 91 88  Resp: 19 17 15 16   Temp:   (!)  97.4 F (36.3 C) 98.1 F (36.7 C)  TempSrc:    Oral  SpO2: 97% 95% 97% 99%  Weight:      Height:        Intake/Output Summary (Last 24 hours) at 04/26/17 1324 Last data filed at 04/26/17 1120  Gross per 24 hour  Intake             1000 ml  Output              550 ml  Net              450 ml   Filed Weights   04/26/17 0606  Weight: 189 lb (85.7 kg)   Body mass index is 29.6 kg/m.  General: Well developed, well nourished, in no acute distress.  Head: Normocephalic, atraumatic, Neck: Negative for carotid bruits. JVD not elevated. Lungs: Clear bilaterally to auscultation without wheezes, rales, or rhonchi. Breathing is unlabored. Heart: RRR with S1 S2. No murmurs, rubs, or gallops appreciated. Abdomen: Soft, non-tender, non-distended with normoactive bowel sounds. Msk:  Left knee in post op dressing Extremities: No clubbing or cyanosis. No edema.  Neuro: Alert and oriented X 3. No facial asymmetry. Psych:  Responds to questions appropriately with a normal affect.  EKG:  No EKG in system  Relevant CV Studies:  No Echo performed  Laboratory Data:   Radiology/Studies:  No results found.  Assessment and Plan:   1. Possible atrial fibrillation: Patient seen by cardiology for evaluation of atrial fibrillation noted in primary provider office. She was not in atrial fibrillation when seen by Dr. Oval Linsey on 5/24. Review of scanned EKG on 01/24/2017 she was not in atrial fib. At that time TSH and ECHO were done, none of these were completed.  -- EKG ordered STAT, cardiac monitory order placed   2. Hypertension: Did have very high BP reading at 9:56 am 220/179, this is an outlier reading and the rest of the BP readings show a well to moderate controlled BP.   Signed, Linus Mako, PA-C  04/26/2017 1:24 PM   History and all data above reviewed.  Patient examined.  I agree with the findings as above.  She was referred to Dr. Oval Linsey for evaluation of atrial fib.   However, at the time of that appt.  Dr. Oval Linsey did not have access to the EKG that was done at the primary care office.  An EKG at that appt was NSR.  I was able to review the scanned EKG from the primary care office.  The computer reading was incorrect and the rhythm was not atrial fib.  It was sinus with PACs.  The patient is now post op and we are asked to evaluate for this possible atrial fib.  She never feels palpitations.  She does not have chest pain.  She has no SOB and has been active.  The patient exam reveals COR:RRR,  soft right upper sternal boarder short systolic murmur  ,  Lungs: Clear  ,  Abd: Positive bowel sounds, no rebound no guarding, Ext No edema  .  All available labs, radiology testing, previous records reviewed. Agree with documented assessment and plan. The patient has had no evidence of atrial fib.  No anticoagulation is indicated.  I do not think that further event or Holter is indicated.  She does have a soft systolic murmur.   I suspect aortic sclerosis.  Dr. Oval Linsey ordered an echo which should be done as an out patient.  The patient has scheduled follow up.     Lauren Richard  3:45 PM  04/26/2017

## 2017-04-26 NOTE — Evaluation (Signed)
Physical Therapy Evaluation Patient Details Name: Lauren Richard MRN: [id number] DOB: July 19, 1936 Today's Date: 04/26/2017   History of Present Illness  Pt is a 81 y/o female s/p elective L TKA secondary to L knee OA. Found to have a fib prior to surgery, therefore cardiology following. PMH includes HTN, arthritis, foot surgery, and obesity.   Clinical Impression  Pt is s/p surgery above with deficits below. PTA, pt was independent with ambulation. Upon eval, pt limited by post op pain and weakness, as well as decreased balance. Pt requiring min to mod A for functional mobility this session. Limited in ambulation tolerance secondary to pain. Reported she will be going SNF at d/c at [facility]. Feel this is appropriate given current mobility limitations. Will continue to follow acutely to maximize functional mobility independence and safety.     Follow Up Recommendations SNF    Equipment Recommendations  None recommended by PT    Recommendations for Other Services       Precautions / Restrictions Precautions Precautions: Knee Precaution Booklet Issued: Yes (comment) Precaution Comments: Reviewed supine ther ex with pt.  Restrictions Weight Bearing Restrictions: Yes LLE Weight Bearing: Partial weight bearing LLE Partial Weight Bearing Percentage or Pounds: 50      Mobility  Bed Mobility Overal bed mobility: Needs Assistance Bed Mobility: Supine to Sit;Sit to Supine     Supine to sit: Min assist Sit to supine: Mod assist   General bed mobility comments: Min A with elevated HOB for LLE management during supine>sit. Verbal cues for sequencing and extended time required. Mod A for LLE lift assist for return to supine.   Transfers Overall transfer level: Needs assistance Equipment used: Rolling walker (2 wheeled) Transfers: Sit to/from Stand Sit to Stand: Mod assist         General transfer comment: Mod A for lift assist. Verbal cues for feet placement and to power  through LEs.   Ambulation/Gait Ambulation/Gait assistance: Min guard Ambulation Distance (Feet): 15 Feet Assistive device: Rolling walker (2 wheeled) Gait Pattern/deviations: Step-through pattern;Decreased step length - right;Decreased step length - left;Decreased weight shift to left;Decreased dorsiflexion - left;Antalgic;Trunk flexed Gait velocity: Decreased Gait velocity interpretation: Below normal speed for age/gender General Gait Details: Slow, antalgic gait secondary to post op pain and weakness. Distance limited to room secondary to pain. verbal cues for sequencing and for upright posture and proximity to device.    Stairs            Wheelchair Mobility    Modified Rankin (Stroke Patients Only)       Balance Overall balance assessment: Needs assistance Sitting-balance support: No upper extremity supported;Feet supported Sitting balance-Leahy Scale: Fair     Standing balance support: Bilateral upper extremity supported;During functional activity Standing balance-Leahy Scale: Poor Standing balance comment: Reliant on RW for support                              Pertinent Vitals/Pain Pain Assessment: 0-10 Pain Score: 9  Pain Location: L knee  Pain Descriptors / Indicators: Aching;Operative site guarding;Sore Pain Intervention(s): Limited activity within patient's tolerance;Monitored during session;Repositioned    Home Living Family/patient expects to be discharged to:: Skilled nursing facility                 Additional Comments: [facility]     Prior Function Level of Independence: Independent               Hand  Dominance   Dominant Hand: Right    Extremity/Trunk Assessment   Upper Extremity Assessment Upper Extremity Assessment: Defer to OT evaluation    Lower Extremity Assessment Lower Extremity Assessment: LLE deficits/detail LLE Deficits / Details: Sensory in tact. Deficits consistent with post op pain and weakness. Able to  perform exercises below.     Cervical / Trunk Assessment Cervical / Trunk Assessment: Normal  Communication   Communication: No difficulties  Cognition Arousal/Alertness: Awake/alert Behavior During Therapy: WFL for tasks assessed/performed Overall Cognitive Status: Within Functional Limits for tasks assessed                                        General Comments General comments (skin integrity, edema, etc.): Pt family present throughout session.     Exercises Total Joint Exercises Ankle Circles/Pumps: AROM;Both;10 reps;Supine Quad Sets: AROM;Left;10 reps;Supine Towel Squeeze: AROM;Both;10 reps;Supine Short Arc Quad: AROM;Left;10 reps;Supine Heel Slides: AAROM;Left;10 reps;Supine Hip ABduction/ADduction: AROM;Left;10 reps;Supine   Assessment/Plan    PT Assessment Patient needs continued PT services  PT Problem List Decreased strength;Decreased range of motion;Decreased activity tolerance;Decreased balance;Decreased mobility;Decreased knowledge of use of DME;Decreased knowledge of precautions;Pain       PT Treatment Interventions DME instruction;Gait training;Functional mobility training;Therapeutic activities;Therapeutic exercise;Balance training;Neuromuscular re-education;Patient/family education    PT Goals (Current goals can be found in the Care Plan section)  Acute Rehab PT Goals Patient Stated Goal: to go to rehab before going home  PT Goal Formulation: With patient Time For Goal Achievement: 05/03/17 Potential to Achieve Goals: Good    Frequency 7X/week   Barriers to discharge        Co-evaluation               AM-PAC PT "6 Clicks" Daily Activity  Outcome Measure Difficulty turning over in bed (including adjusting bedclothes, sheets and blankets)?: Total Difficulty moving from lying on back to sitting on the side of the bed? : Total Difficulty sitting down on and standing up from a chair with arms (e.g., wheelchair, bedside commode,  etc,.)?: Total Help needed moving to and from a bed to chair (including a wheelchair)?: A Little Help needed walking in hospital room?: A Little Help needed climbing 3-5 steps with a railing? : A Lot 6 Click Score: 11    End of Session Equipment Utilized During Treatment: Gait belt Activity Tolerance: Patient limited by pain Patient left: in bed;with call bell/phone within reach;with family/visitor present Nurse Communication: Mobility status PT Visit Diagnosis: Other abnormalities of gait and mobility (R26.89);Pain Pain - Right/Left: Left Pain - part of body: Knee    Time: 3295-1884 PT Time Calculation (min) (ACUTE ONLY): 33 min   Charges:   PT Evaluation $PT Eval Low Complexity: 1 Procedure PT Treatments $Gait Training: 8-22 mins   PT G Codes:        Leighton Ruff, PT, DPT  Acute Rehabilitation Services  Pager: [phone]   Rudean Hitt 04/26/2017, 3:12 PM

## 2017-04-27 ENCOUNTER — Encounter (HOSPITAL_COMMUNITY): Payer: Self-pay | Admitting: Orthopaedic Surgery

## 2017-04-27 LAB — BASIC METABOLIC PANEL
Anion gap: 6 (ref 5–15)
BUN: 11 mg/dL (ref 6–20)
CALCIUM: 8.8 mg/dL — AB (ref 8.9–10.3)
CO2: 25 mmol/L (ref 22–32)
CREATININE: 0.86 mg/dL (ref 0.44–1.00)
Chloride: 103 mmol/L (ref 101–111)
GFR calc non Af Amer: >60 mL/min (ref >60)
GLUCOSE: 132 mg/dL — AB (ref 65–99)
Potassium: 3.3 mmol/L — ABNORMAL LOW (ref 3.5–5.1)
Sodium: 134 mmol/L — ABNORMAL LOW (ref 135–145)

## 2017-04-27 LAB — CBC
HEMATOCRIT: 30.9 % — AB (ref 36.0–46.0)
Hemoglobin: 10.2 g/dL — ABNORMAL LOW (ref 12.0–15.0)
MCH: 29.1 pg (ref 26.0–34.0)
MCHC: 33 g/dL (ref 30.0–36.0)
MCV: 88 fL (ref 78.0–100.0)
Platelets: 241 10*3/uL (ref 150–400)
RBC: 3.51 MIL/uL — ABNORMAL LOW (ref 3.87–5.11)
RDW: 12.8 % (ref 11.5–15.5)
WBC: 9.6 10*3/uL (ref 4.0–10.5)

## 2017-04-27 MED ORDER — ACETAMINOPHEN 500 MG PO TABS
1000.0000 mg | ORAL_TABLET | Freq: Once | ORAL | Status: AC
  Administered 2017-04-27: 1000 mg via ORAL
  Filled 2017-04-27: qty 2

## 2017-04-27 MED ORDER — POTASSIUM CHLORIDE CRYS ER 20 MEQ PO TBCR
20.0000 meq | EXTENDED_RELEASE_TABLET | Freq: Once | ORAL | Status: AC
  Administered 2017-04-27: 20 meq via ORAL
  Filled 2017-04-27: qty 1

## 2017-04-27 NOTE — Social Work (Signed)
[facility] has offered a bed for short term rehab and patient has accepted. Pt wants a private room however wld take semi-private.  CSW spoke with Santiago Glad at [facility] advantage to [employer]. They will f/u and contact CSW when Josem Kaufmann has been approved.  Elissa Hefty, LCSW Clinical Social Worker [phone]

## 2017-04-27 NOTE — Progress Notes (Signed)
Nutrition Brief Note  Patient identified on the Malnutrition Screening Tool (MST) Report  Wt Readings from Last 15 Encounters:  04/26/17 189 lb (85.7 kg)  04/15/17 196 lb 10.4 oz (89.2 kg)  04/06/17 189 lb (85.7 kg)  03/14/17 190 lb (86.2 kg)  03/03/17 198 lb 3.2 oz (89.9 kg)  11/08/16 207 lb (93.9 kg)  10/14/16 207 lb (93.9 kg)    Body mass index is 29.6 kg/m. Patient meets criteria for overweight based on current BMI.   Current diet order is regular, patient is consuming approximately 100% of meals at this time. Pt reports eating well currently and PTA with no other difficulties. Labs and medications reviewed. Pt currently has Ensure ordered. RD to discontinue.  No nutrition interventions warranted at this time. If nutrition issues arise, please consult RD.   Corrin Parker, MS, RD, LDN Pager # [phone] After hours/ weekend pager # [phone]

## 2017-04-27 NOTE — Progress Notes (Signed)
OT Cancellation Note  Patient Details Name: Lauren Richard MRN: [id number] DOB: August 21, 1936   Cancelled Treatment:      Pt is current D/C plan is SNF ( pending placement at [facility]). No apparent immediate acute care OT needs, therefore will defer OT to SNF. If OT eval is needed please call Acute Rehab Dept. at [phone] or text page OT at [phone].    Peri Maris  [phone] 04/27/2017, 11:36 AM

## 2017-04-27 NOTE — Progress Notes (Signed)
Physical Therapy Treatment Patient Details Name: Lauren Richard MRN: [id number] DOB: [birth year] Today's Date: 04/27/2017    History of Present Illness Pt is a 81 y/o female s/p elective L TKA secondary to L knee OA. Found to have a fib prior to surgery, therefore cardiology following. PMH includes HTN, arthritis, foot surgery, and obesity.     PT Comments    Pt agreeable to mobilize OOB and able to ambulate further with RW today. During ambulation with RW, pt required consistent verbal cues for sequencing to comply with WB precautions. Ambulation limited d/t fatigue and pt's HR increased (ranging from 104-129) and irregular heart rhythm (see vitals below). Pt will benefit from continued acute therapy for mobilization, DME training and strengthening for safe d/c.   Vitals:  HR 104 pre ambulation with normal sinus rhythm (supine)  HR 129 post ambulation with irregular rhythm    Follow Up Recommendations  SNF     Equipment Recommendations  None recommended by PT    Recommendations for Other Services       Precautions / Restrictions Precautions Precautions: Knee;Fall Restrictions Weight Bearing Restrictions: Yes LLE Weight Bearing: Partial weight bearing LLE Partial Weight Bearing Percentage or Pounds: 50%    Mobility  Bed Mobility Overal bed mobility: Needs Assistance Bed Mobility: Supine to Sit     Supine to sit: Min assist     General bed mobility comments: Min A with elevated HOB for LLE management during supine>sit. Verbal cues for sequencing and extended time required.  Transfers Overall transfer level: Needs assistance   Transfers: Sit to/from Stand Sit to Stand: Mod assist         General transfer comment: Mod A for lift assist. Verbal cues for hand placement and compliance with WB precautions   Ambulation/Gait Ambulation/Gait assistance: Min guard Ambulation Distance (Feet): 50 Feet Assistive device: Rolling walker (2 wheeled)   Gait velocity:  Decreased Gait velocity interpretation: Below normal speed for age/gender General Gait Details: Slow, antalgic gait secondary to post op pain and weakness. Distance limited secondary to pain and difficulty with sequencing. Consistent verbal cues given for sequencing and use of RW.   Stairs            Wheelchair Mobility    Modified Rankin (Stroke Patients Only)       Balance Overall balance assessment: Needs assistance Sitting-balance support: Bilateral upper extremity supported;Feet unsupported Sitting balance-Leahy Scale: Fair Sitting balance - Comments: Pt with fair seated balance at EOB without need for support    Standing balance support: Bilateral upper extremity supported;During functional activity Standing balance-Leahy Scale: Poor Standing balance comment: Reliant on RW for support                            Cognition Arousal/Alertness: Awake/alert Behavior During Therapy: WFL for tasks assessed/performed Overall Cognitive Status: Within Functional Limits for tasks assessed                                        Exercises Total Joint Exercises Heel Slides: AAROM;Left;10 reps;Supine Hip ABduction/ADduction: Left;10 reps;Supine;AAROM Straight Leg Raises: AAROM;10 reps;Left;Supine Long Arc Quad: AAROM;Seated;Left;10 reps Goniometric ROM: 10-64    General Comments        Pertinent Vitals/Pain Pain Assessment: 0-10 Pain Score: 8  Pain Location: L knee  Pain Descriptors / Indicators: Aching;Operative site guarding;Sore Pain Intervention(s): Limited activity  within patient's tolerance;Monitored during session    Home Living                      Prior Function            PT Goals (current goals can now be found in the care plan section) Acute Rehab PT Goals Patient Stated Goal: to go to rehab before going home  PT Goal Formulation: With patient Time For Goal Achievement: 05/11/17 Potential to Achieve Goals:  Good Progress towards PT goals: Progressing toward goals    Frequency    7X/week      PT Plan Current plan remains appropriate    Co-evaluation              AM-PAC PT "6 Clicks" Daily Activity  Outcome Measure  Difficulty turning over in bed (including adjusting bedclothes, sheets and blankets)?: Total Difficulty moving from lying on back to sitting on the side of the bed? : Total Difficulty sitting down on and standing up from a chair with arms (e.g., wheelchair, bedside commode, etc,.)?: Total Help needed moving to and from a bed to chair (including a wheelchair)?: A Little Help needed walking in hospital room?: A Little Help needed climbing 3-5 steps with a railing? : A Lot 6 Click Score: 11    End of Session Equipment Utilized During Treatment: Gait belt Activity Tolerance: Patient limited by fatigue Patient left: with call bell/phone within reach;in chair;with chair alarm set   PT Visit Diagnosis: Other abnormalities of gait and mobility (R26.89);Muscle weakness (generalized) (M62.81);Pain;Difficulty in walking, not elsewhere classified (R26.2) Pain - Right/Left: Left Pain - part of body: Knee     Time: 1110-1143 PT Time Calculation (min) (ACUTE ONLY): 33 min  Charges:  $Gait Training: 8-22 mins $Therapeutic Exercise: 8-22 mins                    G Codes:       Elberta Leatherwood, SPT Acute Rehab [place] 04/27/2017, 2:08 PM

## 2017-04-27 NOTE — Clinical Social Work Note (Signed)
Clinical Social Work Assessment  Patient Details  Name: Lauren Richard MRN: [id number] Date of Birth: 02/28/1936  Date of referral:  04/27/17               Reason for consult:  Facility Placement                Permission sought to share information with:    Permission granted to share information::  Yes, Verbal Permission Granted  Name::        Agency::  [facility]  Relationship::     Contact Information:     Housing/Transportation Living arrangements for the past 2 months:  Single Family Home Source of Information:  Patient Patient Interpreter Needed:  None Criminal Activity/Legal Involvement Pertinent to Current Situation/Hospitalization:  No - Comment as needed Significant Relationships:  Other Family Members, Siblings Lives with:  Self Do you feel safe going back to the place where you live?  No Need for family participation in patient care:  No (Coment)  Care giving concerns:  Pt was independent with ADL's prior to hospitalization. Pt indicated that she resided alone and does not have any one to care for her at home.  Pt will benefit from short term rehab.  Social Worker assessment / plan:  CSW met with patient at bedside to discuss clinical team's recommendation for SNF placement at [place]. Pt indicated that she wants to go to [facility]. She has experience with SNF as her sister was recently in a facility. Pt gave permission for CSW to send out offer to [facility]. CSW completed FL2, obtained passr and sent out offers.  Pt indicated that she is responsible for self and has spoken with family.  Employment status:  Retired Insurance information:  Managed Medicare PT Recommendations:  Skilled Nursing Facility Information / Referral to community resources:  Skilled Nursing Facility  Patient/Family's Response to care:  Patient appreciative of CSW assisting with placement. No issues or concerns at this time.  Patient/Family's Understanding of and Emotional Response to  Diagnosis, Current Treatment, and Prognosis:  Patient has good understanding of diagnosis, current treatment and prognosis and hopeful that short term rehab will address impairment. No issues or concerns identified at this time.  Emotional Assessment Appearance:  Appears stated age Attitude/Demeanor/Rapport:   (Cooperative) Affect (typically observed):  Accepting, Appropriate Orientation:  Oriented to Self, Oriented to Place, Oriented to  Time, Oriented to Situation Alcohol / Substance use:  Not Applicable Psych involvement (Current and /or in the community):  No (Comment)  Discharge Needs  Concerns to be addressed:  Care Coordination Readmission within the last 30 days:  No Current discharge risk:  Physical Impairment, Dependent with Mobility, Lives alone Barriers to Discharge:  No Barriers Identified   Lauren V Pencil, LCSW 04/27/2017, 11:00 AM  

## 2017-04-27 NOTE — Progress Notes (Addendum)
PATIENT ID: Lauren Richard        MRN:  [id number]          DOB/AGE: July 06, 1936 / 81 y.o.    Lauren Fears, MD   Lauren Borg, PA-C [address] [place], [place]  [address]                             [phone]   PROGRESS NOTE  Subjective:  negative for Chest Pain  negative for Shortness of Breath  negative for Nausea/Vomiting   negative for Calf Pain    Tolerating Diet: yes         Patient reports pain as mild and moderate.     Comfortable  Objective: Vital signs in last 24 hours:   Patient Vitals for the past 24 hrs:  BP Temp Temp src Pulse Resp SpO2  04/27/17 0528 129/60 99.7 F (37.6 C) Oral 91 18 100 %  04/27/17 0025 135/65 99.8 F (37.7 C) Oral (!) 105 20 100 %  04/26/17 2143 (!) 126/53 100.2 F (37.9 C) Oral (!) 103 18 -  04/26/17 1212 (!) 145/76 98.1 F (36.7 C) Oral 88 16 99 %  04/26/17 1130 - (!) 97.4 F (36.3 C) - 91 15 97 %  04/26/17 1120 (!) 136/59 - - 87 17 95 %  04/26/17 1115 - - - 90 19 97 %  04/26/17 1100 - - - 88 15 100 %  04/26/17 1049 (!) 132/56 - - 89 (!) 21 99 %  04/26/17 1045 (!) 147/135 - - 94 (!) 25 97 %  04/26/17 1040 (!) 131/114 - - 93 (!) 21 97 %  04/26/17 1030 - - - 90 (!) 23 100 %  04/26/17 1015 - - - 99 (!) 29 95 %  04/26/17 1010 134/73 - - 91 19 95 %  04/26/17 1000 - - - 90 13 95 %  04/26/17 0956 (!) 220/179 - - 93 17 95 %  04/26/17 0953 (!) 125/102 - - 100 19 97 %  04/26/17 0945 - 98 F (36.7 C) - - 18 100 %      Intake/Output from previous day:   07/17 0701 - 07/18 0700 In: 1000 [I.V.:1000] Out: 1400 [Urine:525; Drains:850]   Intake/Output this shift:   No intake/output data recorded.   Intake/Output      07/17 0701 - 07/18 0700 07/18 0701 - 07/19 0700   I.V. (mL/kg) 1000 (11.7)    Total Intake(mL/kg) 1000 (11.7)    Urine (mL/kg/hr) 525 (0.3)    Drains 850    Blood 25    Total Output 1400     Net -400             LABORATORY DATA:  Recent Labs  04/27/17 0337  WBC 9.6  HGB 10.2*  HCT 30.9*  PLT  241    Recent Labs  04/27/17 0337  NA 134*  K 3.3*  CL 103  CO2 25  BUN 11  CREATININE 0.86  GLUCOSE 132*  CALCIUM 8.8*   Lab Results  Component Value Date   INR 1.03 04/15/2017    Recent Radiographic Studies :  Dg Chest 2 View  Result Date: 04/15/2017 CLINICAL DATA:  Knee arthroplasty.  Preoperative chest x-ray . EXAM: CHEST  2 VIEW COMPARISON:  No prior P FINDINGS: Mediastinum hilar structures normal. Lungs are clear. No pleural effusion pneumothorax. No acute bony abnormality identified. IMPRESSION: No acute cardiopulmonary disease. Electronically Signed  By: [place]   On: 04/15/2017 09:52   Xr Knee Complete 4 Views Left  Result Date: 04/06/2017 4 view x-ray of the left knee reveals significant varus deformity with bone on bone medial joint space. Para-articular spurring off of the medial femoral condyle as well as the tibial plateau. Marked incongruity of the joint surface medially. She does have some lateral periarticular spurring. Patellofemoral joint has marked periosteal spurring and bone loss. Posterior osteophytes are noted in the popliteal area. Bone-on-bone patellofemoral joint. Significant peri articular spurs.    Examination:  General appearance: alert, cooperative, mild distress and moderate distress Resp: clear to auscultation bilaterally Cardio: regular rate and rhythm GI: normal findings: bowel sounds normal  Wound Exam: clean, dry, intact   Drainage:  None: wound tissue dry  Motor Exam: EHL, FHL, Anterior Tibial and Posterior Tibial Intact  Sensory Exam: Superficial Peroneal, Deep Peroneal and Tibial normal  Vascular Exam: Left posterior tibial artery has 1+ (weak) pulse  Assessment:    1 Day Post-Op  Procedure(s) (LRB): LEFT TOTAL KNEE ARTHROPLASTY (Left)  ADDITIONAL DIAGNOSIS:  Principal Problem:   Osteoarthritis of left knee Active Problems:   S/P total knee replacement using cement, left     Plan: Physical Therapy as  ordered Partial Weight Bearing @ 50% (PWB)  DVT Prophylaxis:  Partial Weight Bearing @ 50% (PWB)  DISCHARGE PLAN: Skilled Nursing Facility/Rehab  [facility]  DISCHARGE NEEDS: HHPT, CPM, Walker and 3-in-1 comode seat   Will give KCL 20 meq today in addition to her usual 22meq, Saline Delma Officer  04/27/2017 7:35 AM  Patient ID: Lauren Richard, female   DOB: 1936/08/06, 81 y.o.   MRN: [id number]

## 2017-04-27 NOTE — NC FL2 (Signed)
[place] LEVEL OF CARE SCREENING TOOL     IDENTIFICATION  Patient Name: Lauren Richard Birthdate: Nov 15, 1935 Sex: female Admission Date (Current Location): 04/26/2017  [facility] and [place] Number:  Herbalist and Address:  [facility], [place] [address], [place], [place] [address]      Provider Number: [id number]  Attending Physician Name and Address:  [name], MD  Relative Name and Phone Number:       Current Level of Care: Hospital Recommended Level of Care: [place] Prior Approval Number:    Date Approved/Denied: 04/27/17 PASRR Number: [id number]  Discharge Plan: SNF    Current Diagnoses: Patient Active Problem List   Diagnosis Date Noted  . Osteoarthritis of left knee 04/26/2017  . S/P total knee replacement using cement, left 04/26/2017  . Essential hypertension 03/03/2017  . Hyperlipidemia 03/03/2017  . Osteoarthritis 03/03/2017  . Obesity 03/03/2017  . Status post left foot surgery 12/03/2015  . Hammertoe 11/03/2015    Orientation RESPIRATION BLADDER Height & Weight     Self, Time, Situation, Place  Normal Continent, Indwelling catheter Weight: 189 lb (85.7 kg) Height:  5\' 7"  (170.2 cm)  BEHAVIORAL SYMPTOMS/MOOD NEUROLOGICAL BOWEL NUTRITION STATUS      Continent Diet (See DC summary)  AMBULATORY STATUS COMMUNICATION OF NEEDS Skin   Limited Assist Verbally Surgical wounds (Closed Left Knee Incision with Gauze)                       Personal Care Assistance Level of Assistance  Bathing, Feeding, Dressing Bathing Assistance: Limited assistance Feeding assistance: Independent Dressing Assistance: Limited assistance     Functional Limitations Info             SPECIAL CARE FACTORS FREQUENCY  PT (By licensed PT)     PT Frequency: 7xweek              Contractures      Additional Factors Info  Code Status, Allergies Code Status Info: Full Code Allergies Info:  No Known Allergies           Current Medications (04/27/2017):  This is the current hospital active medication list Current Facility-Administered Medications  Medication Dose Route Frequency Provider Last Rate Last Dose  . acetaminophen (OFIRMEV) IV 1,000 mg  1,000 mg Intravenous Q6H Cherylann Ratel, PA-C   Stopped at 04/27/17 0520  . alum & mag hydroxide-simeth (MAALOX/MYLANTA) 200-200-20 MG/5ML suspension 30 mL  30 mL Oral Q4H PRN Petrarca, Brian D, PA-C      . amLODipine (NORVASC) tablet 5 mg  5 mg Oral Daily [name] D, PA-C   5 mg at 04/27/17 1004  . aspirin EC tablet 325 mg  325 mg Oral BID Cherylann Ratel, PA-C   325 mg at 04/27/17 1005  . atorvastatin (LIPITOR) tablet 20 mg  20 mg Oral Daily Cherylann Ratel, PA-C   20 mg at 04/27/17 1004  . bisacodyl (DULCOLAX) suppository 10 mg  10 mg Rectal Daily PRN [name] D, PA-C      . cholecalciferol (VITAMIN D) tablet 2,000 Units  2,000 Units Oral Daily Cherylann Ratel, PA-C   2,000 Units at 04/27/17 1004  . diphenhydrAMINE (BENADRYL) 12.5 MG/5ML elixir 12.5-25 mg  12.5-25 mg Oral Q4H PRN [name] D, PA-C      . docusate sodium (COLACE) capsule 100 mg  100 mg Oral BID [name] D, PA-C   100 mg  at 04/27/17 1004  . feeding supplement (ENSURE ENLIVE) (ENSURE ENLIVE) liquid 237 mL  237 mL Oral BID BM [name], MD   237 mL at 04/27/17 1015  . HYDROmorphone (DILAUDID) injection 0.5 mg  0.5 mg Intravenous Q2H PRN Petrarca, Brian D, PA-C      . magnesium citrate solution 1 Bottle  1 Bottle Oral Once PRN Petrarca, Mike Craze, PA-C      . menthol-cetylpyridinium (CEPACOL) lozenge 3 mg  1 lozenge Oral PRN [name] D, PA-C       Or  . phenol (CHLORASEPTIC) mouth spray 1 spray  1 spray Mouth/Throat PRN Petrarca, Mike Craze, PA-C      . methocarbamol (ROBAXIN) tablet 500 mg  500 mg Oral Q6H PRN [name] D, PA-C   500 mg at 04/27/17 0525   Or  . methocarbamol (ROBAXIN) 500 mg in dextrose 5 % 50 mL IVPB   500 mg Intravenous Q6H PRN Petrarca, Brian D, PA-C      . metoCLOPramide (REGLAN) tablet 5-10 mg  5-10 mg Oral Q8H PRN [name] D, PA-C       Or  . metoCLOPramide (REGLAN) injection 5-10 mg  5-10 mg Intravenous Q8H PRN Petrarca, Brian D, PA-C      . ondansetron (ZOFRAN) tablet 4 mg  4 mg Oral Q6H PRN [name] D, PA-C       Or  . ondansetron (ZOFRAN) injection 4 mg  4 mg Intravenous Q6H PRN [name] D, PA-C      . oxyCODONE (Oxy IR/ROXICODONE) immediate release tablet 5-10 mg  5-10 mg Oral Q3H PRN [name] D, PA-C   10 mg at 04/27/17 1015  . polyethylene glycol (MIRALAX / GLYCOLAX) packet 17 g  17 g Oral Daily PRN Petrarca, Brian D, PA-C      . potassium chloride (K-DUR) CR tablet 10 mEq  10 mEq Oral Daily Cherylann Ratel, PA-C   10 mEq at 04/27/17 1005     Discharge Medications: Please see discharge summary for a list of discharge medications.  Relevant Imaging Results:  Relevant Lab Results:   Additional Information SS: [place], LCSW

## 2017-04-27 NOTE — Progress Notes (Addendum)
    Tele reviewed.  No atrial fib. No cardiac indication for Eliquis.  No new suggestions.  Call with further questions.

## 2017-04-28 DIAGNOSIS — E669 Obesity, unspecified: Secondary | ICD-10-CM | POA: Diagnosis not present

## 2017-04-28 DIAGNOSIS — K5900 Constipation, unspecified: Secondary | ICD-10-CM | POA: Diagnosis not present

## 2017-04-28 DIAGNOSIS — E785 Hyperlipidemia, unspecified: Secondary | ICD-10-CM | POA: Diagnosis not present

## 2017-04-28 DIAGNOSIS — Z96652 Presence of left artificial knee joint: Secondary | ICD-10-CM | POA: Diagnosis not present

## 2017-04-28 DIAGNOSIS — I10 Essential (primary) hypertension: Secondary | ICD-10-CM | POA: Diagnosis not present

## 2017-04-28 DIAGNOSIS — M25569 Pain in unspecified knee: Secondary | ICD-10-CM | POA: Diagnosis not present

## 2017-04-28 DIAGNOSIS — Z471 Aftercare following joint replacement surgery: Secondary | ICD-10-CM | POA: Diagnosis not present

## 2017-04-28 DIAGNOSIS — S8002XA Contusion of left knee, initial encounter: Secondary | ICD-10-CM | POA: Diagnosis not present

## 2017-04-28 DIAGNOSIS — R2681 Unsteadiness on feet: Secondary | ICD-10-CM | POA: Diagnosis not present

## 2017-04-28 DIAGNOSIS — R5381 Other malaise: Secondary | ICD-10-CM | POA: Diagnosis not present

## 2017-04-28 DIAGNOSIS — M6281 Muscle weakness (generalized): Secondary | ICD-10-CM | POA: Diagnosis not present

## 2017-04-28 LAB — BPAM RBC
Blood Product Expiration Date: 201807242359
Blood Product Expiration Date: 201807242359
Unit Type and Rh: 6200
Unit Type and Rh: 6200

## 2017-04-28 LAB — CBC
HEMATOCRIT: 32.1 % — AB (ref 36.0–46.0)
Hemoglobin: 10.7 g/dL — ABNORMAL LOW (ref 12.0–15.0)
MCH: 29.6 pg (ref 26.0–34.0)
MCHC: 33.3 g/dL (ref 30.0–36.0)
MCV: 88.9 fL (ref 78.0–100.0)
Platelets: 240 10*3/uL (ref 150–400)
RBC: 3.61 MIL/uL — ABNORMAL LOW (ref 3.87–5.11)
RDW: 12.7 % (ref 11.5–15.5)
WBC: 11.7 10*3/uL — ABNORMAL HIGH (ref 4.0–10.5)

## 2017-04-28 LAB — TYPE AND SCREEN
ABO/RH(D): A POS
Antibody Screen: POSITIVE
DAT, IgG: NEGATIVE
PT AG TYPE: NEGATIVE
UNIT DIVISION: 0
UNIT DIVISION: 0

## 2017-04-28 LAB — BASIC METABOLIC PANEL
Anion gap: 9 (ref 5–15)
BUN: 11 mg/dL (ref 6–20)
CALCIUM: 8.8 mg/dL — AB (ref 8.9–10.3)
CO2: 22 mmol/L (ref 22–32)
CREATININE: 0.82 mg/dL (ref 0.44–1.00)
Chloride: 103 mmol/L (ref 101–111)
GFR calc Af Amer: >60 mL/min (ref >60)
GFR calc non Af Amer: >60 mL/min (ref >60)
GLUCOSE: 120 mg/dL — AB (ref 65–99)
Potassium: 3.7 mmol/L (ref 3.5–5.1)
Sodium: 134 mmol/L — ABNORMAL LOW (ref 135–145)

## 2017-04-28 MED ORDER — OXYCODONE HCL 5 MG PO TABS: 5.0000 mg | ORAL_TABLET | ORAL | 0 refills | Status: DC | PRN

## 2017-04-28 MED ORDER — ASPIRIN 325 MG PO TBEC: 325.0000 mg | DELAYED_RELEASE_TABLET | Freq: Two times a day (BID) | ORAL | 0 refills | Status: DC

## 2017-04-28 MED ORDER — METHOCARBAMOL 500 MG PO TABS: 500.0000 mg | ORAL_TABLET | Freq: Three times a day (TID) | ORAL | 0 refills | Status: DC | PRN

## 2017-04-28 NOTE — Brief Op Note (Signed)
PATIENT ID: MARIA COIN        MRN:  [id number]          DOB/AGE: Jun 05, 1936 / 81 y.o.    [name], MD   [name], PA-C [address] [place], [place]  [address]                             [phone]   PROGRESS NOTE  Subjective:  negative for Chest Pain  negative for Shortness of Breath  negative for Nausea/Vomiting   negative for Calf Pain    Tolerating Diet: yes         Patient reports pain as mild.     Comfortable,anxious for discharge  Objective: Vital signs in last 24 hours:   Patient Vitals for the past 24 hrs:  BP Temp Temp src Pulse Resp SpO2  04/28/17 0900 - 99.1 F (37.3 C) Oral - - -  04/28/17 0631 - 100 F (37.8 C) Oral - - -  04/28/17 0438 (!) 151/66 100.2 F (37.9 C) Oral (!) 115 16 98 %  04/27/17 2259 - 99.9 F (37.7 C) Oral - - -  04/27/17 2059 136/60 (!) 100.8 F (38.2 C) Oral (!) 116 16 98 %  04/27/17 1500 (!) 98/45 100.2 F (37.9 C) Oral (!) 112 18 100 %      Intake/Output from previous day:   07/18 0701 - 07/19 0700 In: 480 [P.O.:480] Out: 150 [Drains:150]   Intake/Output this shift:   07/19 0701 - 07/19 1900 In: 280 [P.O.:280] Out: 50 [Drains:50]   Intake/Output      07/18 0701 - 07/19 0700 07/19 0701 - 07/20 0700   P.O. 480 280   Total Intake(mL/kg) 480 (5.6) 280 (3.3)   Drains 150 50   Total Output 150 50   Net +330 +230        Urine Occurrence 1 x 2 x      LABORATORY DATA:  Recent Labs  04/27/17 0337 04/28/17 0444  WBC 9.6 11.7*  HGB 10.2* 10.7*  HCT 30.9* 32.1*  PLT 241 240    Recent Labs  04/27/17 0337 04/28/17 0444  NA 134* 134*  K 3.3* 3.7  CL 103 103  CO2 25 22  BUN 11 11  CREATININE 0.86 0.82  GLUCOSE 132* 120*  CALCIUM 8.8* 8.8*   Lab Results  Component Value Date   INR 1.03 04/15/2017    Recent Radiographic Studies :  Dg Chest 2 View  Result Date: 04/15/2017 CLINICAL DATA:  Knee arthroplasty.  Preoperative chest x-ray . EXAM: CHEST  2 VIEW COMPARISON:  No prior P FINDINGS:  Mediastinum hilar structures normal. Lungs are clear. No pleural effusion pneumothorax. No acute bony abnormality identified. IMPRESSION: No acute cardiopulmonary disease. Electronically Signed   By: Marcello Moores  Register   On: 04/15/2017 09:52   Xr Knee Complete 4 Views Left  Result Date: 04/06/2017 4 view x-ray of the left knee reveals significant varus deformity with bone on bone medial joint space. Para-articular spurring off of the medial femoral condyle as well as the tibial plateau. Marked incongruity of the joint surface medially. She does have some lateral periarticular spurring. Patellofemoral joint has marked periosteal spurring and bone loss. Posterior osteophytes are noted in the popliteal area. Bone-on-bone patellofemoral joint. Significant peri articular spurs.    Examination:  General appearance: alert, cooperative and no distress  Wound Exam: clean, dry, intact   Drainage:  Scant/small amount  Serosanguinous exudate In hemovac Motor Exam: EHL, FHL, Anterior Tibial and Posterior Tibial Intact  Sensory Exam: Superficial Peroneal, Deep Peroneal and Tibial normal  Vascular Exam: Normal  Assessment:    2 Days Post-Op  Procedure(s) (LRB): LEFT TOTAL KNEE ARTHROPLASTY (Left)  ADDITIONAL DIAGNOSIS:  Principal Problem:   Osteoarthritis of left knee Active Problems:   S/P total knee replacement using cement, left  Acute Blood Loss Anemia and Hypokalemia-potassium corrected, anemia improved and asymptomatic   Plan: Physical Therapy as ordered Partial Weight Bearing @ 50% (PWB)  DVT Prophylaxis:  Xarelto, Foot Pumps and TED hose  DISCHARGE PLAN: Skilled Nursing Facility/Rehab  DISCHARGE NEEDS: HHPT, CPM, Walker and 3-in-1 comode seat Dressing changed, wound clean and dry-no calf pain, ready for discharge       [name]  04/28/2017 1:45 PM

## 2017-04-28 NOTE — Discharge Summary (Signed)
Lauren Fears, MD   [name], PA-C [address], [place], [place]  [address]                             [phone]  PATIENT ID: Lauren Richard        MRN:  [id number]          DOB/AGE: 12-16-1935 / 81 y.o.    DISCHARGE SUMMARY  ADMISSION DATE:    04/26/2017 DISCHARGE DATE:   04/28/2017   ADMISSION DIAGNOSIS: LEFT KNEE OSTEOARTHRITIS    DISCHARGE DIAGNOSIS:  LEFT KNEE OSTEOARTHRITIS    ADDITIONAL DIAGNOSIS: Principal Problem:   Osteoarthritis of left knee Active Problems:   S/P total knee replacement using cement, left  Past Medical History:  Diagnosis Date  . Arthritis    "all over" (04/26/2017)  . Essential hypertension 03/03/2017  . History of blood transfusion 1979   "when I had my hysterectomy"  . Hyperlipidemia   . Obesity 03/03/2017  . Osteoarthritis 03/03/2017    PROCEDURE: Procedure(s): LEFT TOTAL KNEE ARTHROPLASTY  on 04/26/2017  CONSULTS: none    HISTORY: Patient is a 81 y.o. female presented with a history of pain in the left knee for 30 years. Onset of symptoms was gradual starting 30 years ago with gradually worsening course since that time. Prior procedures on the knee are arthroscopy x 2. Patient has been treated conservatively with over-the-counter NSAIDs, tramadol, and activity modification. Patient currently rates pain in the knee at 8 out of 10 with activity. There is no pain at night. present.  They have been previously treated with: NSAIDS: Narcotics, NSAID, Steriods with mild improvement  Knee injection with corticosteroid  was performed Knee injection with visco supplementation was performed Medications: Narcotics, NSAID, Snyvisc with mild improvement   HOSPITAL COURSE:  Lauren Richard is a 81 y.o. admitted on 04/26/2017 and found to have a diagnosis of [place].  After appropriate laboratory studies were obtained  they were taken to the operating room on 04/26/2017 and underwent  Procedure(s): LEFT TOTAL KNEE  ARTHROPLASTY  Left.   They were given perioperative antibiotics:  Anti-infectives    Start     Dose/Rate Route Frequency Ordered Stop   04/26/17 1300  ceFAZolin (ANCEF) IVPB 2g/100 mL premix     2 g 200 mL/hr over 30 Minutes Intravenous Every 6 hours 04/26/17 1200 04/26/17 1931   04/26/17 1200  ceFAZolin (ANCEF) IVPB 2g/100 mL premix  Status:  Discontinued     2 g 200 mL/hr over 30 Minutes Intravenous Every 6 hours 04/26/17 1147 04/26/17 1200   04/26/17 0700  ceFAZolin (ANCEF) IVPB 2g/100 mL premix     2 g 200 mL/hr over 30 Minutes Intravenous To [facility] 04/25/17 1045 04/26/17 0755    .  Tolerated the procedure well.  Placed with a foley intraoperatively.     Toradol was given post op.  POD #1, allowed out of bed to a chair.  PT for ambulation and exercise program.  Foley D/C'd in morning.  IV saline locked.  O2 discontionued.  POD #2, continued PT and ambulation.  Hemovac pulled. .  The remainder of the hospital course was dedicated to ambulation and strengthening.   The patient was discharged on 2 Days Post-Op in  Stable condition.  Blood products given:none  DIAGNOSTIC STUDIES: Recent vital signs: Patient Vitals for the past 24 hrs:  BP Temp Temp src Pulse Resp SpO2  04/28/17 0900 -  99.1 F (37.3 C) Oral - - -  04/28/17 0631 - 100 F (37.8 C) Oral - - -  04/28/17 0438 (!) 151/66 100.2 F (37.9 C) Oral (!) 115 16 98 %  04/27/17 2259 - 99.9 F (37.7 C) Oral - - -  04/27/17 2059 136/60 (!) 100.8 F (38.2 C) Oral (!) 116 16 98 %  04/27/17 1500 (!) 98/45 100.2 F (37.9 C) Oral (!) 112 18 100 %       Recent laboratory studies:  Recent Labs  04/27/17 0337 04/28/17 0444  WBC 9.6 11.7*  HGB 10.2* 10.7*  HCT 30.9* 32.1*  PLT 241 240    Recent Labs  04/27/17 0337 04/28/17 0444  NA 134* 134*  K 3.3* 3.7  CL 103 103  CO2 25 22  BUN 11 11  CREATININE 0.86 0.82  GLUCOSE 132* 120*  CALCIUM 8.8* 8.8*   Lab Results  Component Value Date   INR 1.03  04/15/2017     Recent Radiographic Studies :  Dg Chest 2 View  Result Date: 04/15/2017 CLINICAL DATA:  Knee arthroplasty.  Preoperative chest x-ray . EXAM: CHEST  2 VIEW COMPARISON:  No prior P FINDINGS: Mediastinum hilar structures normal. Lungs are clear. No pleural effusion pneumothorax. No acute bony abnormality identified. IMPRESSION: No acute cardiopulmonary disease. Electronically Signed   By: Marcello Moores  Register   On: 04/15/2017 09:52   Xr Knee Complete 4 Views Left  Result Date: 04/06/2017 4 view x-ray of the left knee reveals significant varus deformity with bone on bone medial joint space. Para-articular spurring off of the medial femoral condyle as well as the tibial plateau. Marked incongruity of the joint surface medially. She does have some lateral periarticular spurring. Patellofemoral joint has marked periosteal spurring and bone loss. Posterior osteophytes are noted in the popliteal area. Bone-on-bone patellofemoral joint. Significant peri articular spurs.   DISCHARGE INSTRUCTIONS: Discharge Instructions    CPM    Complete by:  As directed    Continuous passive motion machine (CPM):      Use the CPM from 0 to 60 degrees for 6-8 hours per day.      You may increase by 5-10 per day.  You may break it up into 2 or 3 sessions per day.      Use CPM for 3-4 weeks or until you are told to stop.   CPM    Complete by:  As directed    Continuous passive motion machine (CPM):      Use the CPM from 0 to 60 degrees for 6-8 hours per day.      You may increase by 5-10 per day.  You may break it up into 2 or 3 sessions per day.      Use CPM for 3-4 weeks or until you are told to stop.   Call MD / Call 911    Complete by:  As directed    If you experience chest pain or shortness of breath, CALL 911 and be transported to the hospital emergency room.  If you develope a fever above 101 F, pus (white drainage) or increased drainage or redness at the wound, or calf pain, call your surgeon's  office.   Call MD / Call 911    Complete by:  As directed    If you experience chest pain or shortness of breath, CALL 911 and be transported to the hospital emergency room.  If you develope a fever above 101 F, pus (white drainage) or  increased drainage or redness at the wound, or calf pain, call your surgeon's office.   Change dressing    Complete by:  As directed    DO NOT CHANGE YOUR DRESSING.   Change dressing    Complete by:  As directed    DO NOT CHANGE YOUR DRESSING.   Constipation Prevention    Complete by:  As directed    Drink plenty of fluids.  Prune juice may be helpful.  You may use a stool softener, such as Colace (over the counter) 100 mg twice a day.  Use MiraLax (over the counter) for constipation as needed.   Constipation Prevention    Complete by:  As directed    Drink plenty of fluids.  Prune juice may be helpful.  You may use a stool softener, such as Colace (over the counter) 100 mg twice a day.  Use MiraLax (over the counter) for constipation as needed.   Diet general    Complete by:  As directed    Diet general    Complete by:  As directed    Discharge instructions    Complete by:  As directed    INSTRUCTIONS AFTER JOINT REPLACEMENT   Remove items at home which could result in a fall. This includes throw rugs or furniture in walking pathways ICE to the affected joint every three hours while awake for 30 minutes at a time, for at least the first 3-5 days, and then as needed for pain and swelling.  Continue to use ice for pain and swelling. You may notice swelling that will progress down to the foot and ankle.  This is normal after surgery.  Elevate your leg when you are not up walking on it.   Continue to use the breathing machine you got in the hospital (incentive spirometer) which will help keep your temperature down.  It is common for your temperature to cycle up and down following surgery, especially at night when you are not up moving around and exerting  yourself.  The breathing machine keeps your lungs expanded and your temperature down.   DIET:  As you were doing prior to hospitalization, we recommend a well-balanced diet.  DRESSING / WOUND CARE / SHOWERING  Keep the surgical dressing until follow up.  The dressing is water proof, so you can shower without any extra covering.  IF THE DRESSING FALLS OFF or the wound gets wet inside, change the dressing with sterile gauze.  Please use good hand washing techniques before changing the dressing.  Do not use any lotions or creams on the incision until instructed by your surgeon.    ACTIVITY  Increase activity slowly as tolerated, but follow the weight bearing instructions below.   No driving for 6 weeks or until further direction given by your physician.  You cannot drive while taking narcotics.  No lifting or carrying greater than 10 lbs. until further directed by your surgeon. Avoid periods of inactivity such as sitting longer than an hour when not asleep. This helps prevent blood clots.  You may return to work once you are authorized by your doctor.     WEIGHT BEARING   Partial weight bearing with assist device as directed.  50%   EXERCISES  Results after joint replacement surgery are often greatly improved when you follow the exercise, range of motion and muscle strengthening exercises prescribed by your doctor. Safety measures are also important to protect the joint from further injury. Any time any of these exercises  cause you to have increased pain or swelling, decrease what you are doing until you are comfortable again and then slowly increase them. If you have problems or questions, call your caregiver or physical therapist for advice.   Rehabilitation is important following a joint replacement. After just a few days of immobilization, the muscles of the leg can become weakened and shrink (atrophy).  These exercises are designed to build up the tone and strength of the thigh and leg  muscles and to improve motion. Often times heat used for twenty to thirty minutes before working out will loosen up your tissues and help with improving the range of motion but do not use heat for the first two weeks following surgery (sometimes heat can increase post-operative swelling).   These exercises can be done on a training (exercise) mat,  on a table or on a bed. Use whatever works the best and is most comfortable for you.    Use music or television while you are exercising so that the exercises are a pleasant break in your day. This will make your life better with the exercises acting as a break in your routine that you can look forward to.   Perform all exercises about fifteen times, three times per day or as directed.  You should exercise both the operative leg and the other leg as well.   Exercises include:  Quad Sets - Tighten up the muscle on the front of the thigh (Quad) and hold for 5-10 seconds.   Straight Leg Raises - With your knee straight (if you were given a brace, keep it on), lift the leg to 60 degrees, hold for 3 seconds, and slowly lower the leg.  Perform this exercise against resistance later as your leg gets stronger.  Leg Slides: Lying on your back, slowly slide your foot toward your buttocks, bending your knee up off the floor (only go as far as is comfortable). Then slowly slide your foot back down until your leg is flat on the floor again.  Angel Wings: Lying on your back spread your legs to the side as far apart as you can without causing discomfort.  Hamstring Strength:  Lying on your back, push your heel against the floor with your leg straight by tightening up the muscles of your buttocks.  Repeat, but this time bend your knee to a comfortable angle, and push your heel against the floor.  You may put a pillow under the heel to make it more comfortable if necessary.   A rehabilitation program following joint replacement surgery can speed recovery and prevent re-injury  in the future due to weakened muscles. Contact your doctor or a physical therapist for more information on knee rehabilitation.    CONSTIPATION  Constipation is defined medically as fewer than three stools per week and severe constipation as less than one stool per week.  Even if you have a regular bowel pattern at home, your normal regimen is likely to be disrupted due to multiple reasons following surgery.  Combination of anesthesia, postoperative narcotics, change in appetite and fluid intake all can affect your bowels.   YOU MUST use at least one of the following options; they are listed in order of increasing strength to get the job done.  They are all available over the counter, and you may need to use some, POSSIBLY even all of these options:    Drink plenty of fluids (prune juice may be helpful) and high fiber foods Colace 100 mg  by mouth twice a day  Senokot for constipation as directed and as needed Dulcolax (bisacodyl), take with full glass of water  Miralax (polyethylene glycol) once or twice a day as needed.  If you have tried all these things and are unable to have a bowel movement in the first 3-4 days after surgery call either your surgeon or your primary doctor.    If you experience loose stools or diarrhea, hold the medications until you stool forms back up.  If your symptoms do not get better within 1 week or if they get worse, check with your doctor.  If you experience "the worst abdominal pain ever" or develop nausea or vomiting, please contact the office immediately for further recommendations for treatment.   ITCHING:  If you experience itching with your medications, try taking only a single pain pill, or even half a pain pill at a time.  You can also use Benadryl over the counter for itching or also to help with sleep.   TED HOSE STOCKINGS:  Use stockings on both legs until for at least 2 weeks or as directed by physician office. They may be removed at night for  sleeping.  MEDICATIONS:  See your medication summary on the "After Visit Summary" that nursing will review with you.  You may have some home medications which will be placed on hold until you complete the course of blood thinner medication.  It is important for you to complete the blood thinner medication as prescribed.  PRECAUTIONS:  If you experience chest pain or shortness of breath - call 911 immediately for transfer to the hospital emergency department.   If you develop a fever greater that 101 F, purulent drainage from wound, increased redness or drainage from wound, foul odor from the wound/dressing, or calf pain - CONTACT YOUR SURGEON.                                                   FOLLOW-UP APPOINTMENTS:  If you do not already have a post-op appointment, please call the office for an appointment to be seen by your surgeon.  Guidelines for how soon to be seen are listed in your "After Visit Summary", but are typically between 1-4 weeks after surgery.  OTHER INSTRUCTIONS:   Knee Replacement:  Do not place pillow under knee, focus on keeping the knee straight while resting. CPM instructions: 0-90 degrees, 2 hours in the morning, 2 hours in the afternoon, and 2 hours in the evening. Place foam block, curve side up under heel at all times except when in CPM or when walking.  DO NOT modify, tear, cut, or change the foam block in any way.  MAKE SURE YOU:  Understand these instructions.  Get help right away if you are not doing well or get worse.    Thank you for letting us be a part of your medical care team.  It is a privilege we respect greatly.  We hope these instructions will help you stay on track for a fast and full recovery!   Discharge instructions    Complete by:  As directed    INSTRUCTIONS AFTER JOINT REPLACEMENT   Remove items at home which could result in a fall. This includes throw rugs or furniture in walking pathways ICE to the affected joint every three hours while  awake for  30 minutes at a time, for at least the first 3-5 days, and then as needed for pain and swelling.  Continue to use ice for pain and swelling. You may notice swelling that will progress down to the foot and ankle.  This is normal after surgery.  Elevate your leg when you are not up walking on it.   Continue to use the breathing machine you got in the hospital (incentive spirometer) which will help keep your temperature down.  It is common for your temperature to cycle up and down following surgery, especially at night when you are not up moving around and exerting yourself.  The breathing machine keeps your lungs expanded and your temperature down.   DIET:  As you were doing prior to hospitalization, we recommend a well-balanced diet.  DRESSING / WOUND CARE / SHOWERING  Keep the surgical dressing until follow up.  The dressing is water proof, so you can shower without any extra covering.  IF THE DRESSING FALLS OFF or the wound gets wet inside, change the dressing with sterile gauze.  Please use good hand washing techniques before changing the dressing.  Do not use any lotions or creams on the incision until instructed by your surgeon.    ACTIVITY  Increase activity slowly as tolerated, but follow the weight bearing instructions below.   No driving for 6 weeks or until further direction given by your physician.  You cannot drive while taking narcotics.  No lifting or carrying greater than 10 lbs. until further directed by your surgeon. Avoid periods of inactivity such as sitting longer than an hour when not asleep. This helps prevent blood clots.  You may return to work once you are authorized by your doctor.     WEIGHT BEARING   Partial weight bearing with assist device as directed.  50%   EXERCISES  Results after joint replacement surgery are often greatly improved when you follow the exercise, range of motion and muscle strengthening exercises prescribed by your doctor. Safety  measures are also important to protect the joint from further injury. Any time any of these exercises cause you to have increased pain or swelling, decrease what you are doing until you are comfortable again and then slowly increase them. If you have problems or questions, call your caregiver or physical therapist for advice.   Rehabilitation is important following a joint replacement. After just a few days of immobilization, the muscles of the leg can become weakened and shrink (atrophy).  These exercises are designed to build up the tone and strength of the thigh and leg muscles and to improve motion. Often times heat used for twenty to thirty minutes before working out will loosen up your tissues and help with improving the range of motion but do not use heat for the first two weeks following surgery (sometimes heat can increase post-operative swelling).   These exercises can be done on a training (exercise) mat,  on a table or on a bed. Use whatever works the best and is most comfortable for you.    Use music or television while you are exercising so that the exercises are a pleasant break in your day. This will make your life better with the exercises acting as a break in your routine that you can look forward to.   Perform all exercises about fifteen times, three times per day or as directed.  You should exercise both the operative leg and the other leg as well.   Exercises include:  Quad Sets - Tighten up the muscle on the front of the thigh (Quad) and hold for 5-10 seconds.   Straight Leg Raises - With your knee straight (if you were given a brace, keep it on), lift the leg to 60 degrees, hold for 3 seconds, and slowly lower the leg.  Perform this exercise against resistance later as your leg gets stronger.  Leg Slides: Lying on your back, slowly slide your foot toward your buttocks, bending your knee up off the floor (only go as far as is comfortable). Then slowly slide your foot back down until  your leg is flat on the floor again.  Angel Wings: Lying on your back spread your legs to the side as far apart as you can without causing discomfort.  Hamstring Strength:  Lying on your back, push your heel against the floor with your leg straight by tightening up the muscles of your buttocks.  Repeat, but this time bend your knee to a comfortable angle, and push your heel against the floor.  You may put a pillow under the heel to make it more comfortable if necessary.   A rehabilitation program following joint replacement surgery can speed recovery and prevent re-injury in the future due to weakened muscles. Contact your doctor or a physical therapist for more information on knee rehabilitation.    CONSTIPATION  Constipation is defined medically as fewer than three stools per week and severe constipation as less than one stool per week.  Even if you have a regular bowel pattern at home, your normal regimen is likely to be disrupted due to multiple reasons following surgery.  Combination of anesthesia, postoperative narcotics, change in appetite and fluid intake all can affect your bowels.   YOU MUST use at least one of the following options; they are listed in order of increasing strength to get the job done.  They are all available over the counter, and you may need to use some, POSSIBLY even all of these options:    Drink plenty of fluids (prune juice may be helpful) and high fiber foods Colace 100 mg by mouth twice a day  Senokot for constipation as directed and as needed Dulcolax (bisacodyl), take with full glass of water  Miralax (polyethylene glycol) once or twice a day as needed.  If you have tried all these things and are unable to have a bowel movement in the first 3-4 days after surgery call either your surgeon or your primary doctor.    If you experience loose stools or diarrhea, hold the medications until you stool forms back up.  If your symptoms do not get better within 1 week or  if they get worse, check with your doctor.  If you experience "the worst abdominal pain ever" or develop nausea or vomiting, please contact the office immediately for further recommendations for treatment.   ITCHING:  If you experience itching with your medications, try taking only a single pain pill, or even half a pain pill at a time.  You can also use Benadryl over the counter for itching or also to help with sleep.   TED HOSE STOCKINGS:  Use stockings on both legs until for at least 2 weeks or as directed by physician office. They may be removed at night for sleeping.  MEDICATIONS:  See your medication summary on the "After Visit Summary" that nursing will review with you.  You may have some home medications which will be placed on hold until you complete the course  of blood thinner medication.  It is important for you to complete the blood thinner medication as prescribed.  PRECAUTIONS:  If you experience chest pain or shortness of breath - call 911 immediately for transfer to the hospital emergency department.   If you develop a fever greater that 101 F, purulent drainage from wound, increased redness or drainage from wound, foul odor from the wound/dressing, or calf pain - CONTACT YOUR SURGEON.                                                   FOLLOW-UP APPOINTMENTS:  If you do not already have a post-op appointment, please call the office for an appointment to be seen by your surgeon.  Guidelines for how soon to be seen are listed in your "After Visit Summary", but are typically between 1-4 weeks after surgery.  OTHER INSTRUCTIONS:   Knee Replacement:  Do not place pillow under knee, focus on keeping the knee straight while resting. CPM instructions: 0-90 degrees, 2 hours in the morning, 2 hours in the afternoon, and 2 hours in the evening. Place foam block, curve side up under heel at all times except when in CPM or when walking.  DO NOT modify, tear, cut, or change the foam block in any  way.  MAKE SURE YOU:  Understand these instructions.  Get help right away if you are not doing well or get worse.    Thank you for letting us be a part of your medical care team.  It is a privilege we respect greatly.  We hope these instructions will help you stay on track for a fast and full recovery!   Do not put a pillow under the knee. Place it under the heel.    Complete by:  As directed    Do not put a pillow under the knee. Place it under the heel.    Complete by:  As directed    Driving restrictions    Complete by:  As directed    No driving for 6 weeks   Increase activity slowly as tolerated    Complete by:  As directed    Increase activity slowly as tolerated    Complete by:  As directed    Lifting restrictions    Complete by:  As directed    No lifting for 6 weeks   Partial weight bearing    Complete by:  As directed    % Body Weight:  50%   Laterality:  left   Extremity:  Lower   Partial weight bearing    Complete by:  As directed    % Body Weight:  50%   Laterality:  left   Extremity:  Lower   Patient may shower    Complete by:  As directed    You may shower over your dressing   Patient may shower    Complete by:  As directed    You may shower over your dressing   TED hose    Complete by:  As directed    Use stockings (TED hose) for 3 weeks on left  leg.  You may remove them at night for sleeping.   TED hose    Complete by:  As directed    Use stockings (TED hose) for 3 weeks on left  leg.  You may  remove them at night for sleeping.      DISCHARGE MEDICATIONS:   Allergies as of 04/28/2017      Reactions   No Known Allergies       Medication List    STOP taking these medications   apixaban 5 MG Tabs tablet Commonly known as:  ELIQUIS   diclofenac sodium 1 % Gel Commonly known as:  VOLTAREN   traMADol 50 MG tablet Commonly known as:  ULTRAM     TAKE these medications   amLODipine 5 MG tablet Commonly known as:  NORVASC Take 5 mg by mouth  daily.   aspirin 325 MG EC tablet Take 1 tablet (325 mg total) by mouth 2 (two) times daily. What changed:  medication strength  how much to take  when to take this   atorvastatin 20 MG tablet Commonly known as:  LIPITOR Take 20 mg by mouth daily.   losartan-hydrochlorothiazide 100-25 MG tablet Commonly known as:  HYZAAR   methocarbamol 500 MG tablet Commonly known as:  ROBAXIN Take 1 tablet (500 mg total) by mouth every 8 (eight) hours as needed for muscle spasms.   oxyCODONE 5 MG immediate release tablet Commonly known as:  Oxy IR/ROXICODONE Take 1-2 tablets (5-10 mg total) by mouth every 4 (four) hours as needed for breakthrough pain.   potassium chloride 10 MEQ tablet Commonly known as:  K-DUR Take 10 mEq by mouth daily.   Vitamin D 2000 units tablet Take 2,000 Units by mouth daily.            Durable Medical Equipment        Start     Ordered   04/26/17 1148  DME Walker rolling  Once    Question:  Patient needs a walker to treat with the following condition  Answer:  S/P total knee replacement using cement, right   04/26/17 1147   04/26/17 1148  DME 3 n 1  Once     04/26/17 1147   04/26/17 1148  DME Bedside commode  Once    Question:  Patient needs a bedside commode to treat with the following condition  Answer:  S/P total knee replacement using cement, right   04/26/17 1147      FOLLOW UP VISIT:    Contact information for follow-up providers    [name], MD. Schedule an appointment as soon as possible for a visit on 05/09/2017.   Specialty:  Orthopedic Surgery Contact information: [address] [place] [address] [phone]            Contact information for after-discharge care    Destination    [facility] Follow up.   Specialty:  Skilled Nursing Facility Contact information: [place] [phone]                  DISPOSITION:   Skilled Nursing  Facility/Rehab  CONDITION:  Stable   Mike Craze. [place], [place] [phone]  04/28/2017 1:53 PM

## 2017-04-28 NOTE — Progress Notes (Signed)
Physical Therapy Treatment Patient Details Name: Lauren Richard MRN: [id number] DOB: [birth year] Today's Date: 04/28/2017    History of Present Illness Pt is a 81 y/o female s/p elective L TKA secondary to L knee OA. Found to have a fib prior to surgery, therefore cardiology following. PMH includes HTN, arthritis, foot surgery, and obesity.     PT Comments    Patient progressing well towards PT goals. Improved ambulation today with Min A for balance/safety. Requires max verbal cues to maintain PWB LLE. HR ranged from 100-135 bpm during activity. Performed exercises in chair. Improved knee AROM 8-80 degrees. Pt eager to get to rehab and supposed to d/c today. Will follow if still in hospital.   Follow Up Recommendations  SNF     Equipment Recommendations  None recommended by PT    Recommendations for Other Services       Precautions / Restrictions Precautions Precautions: Knee;Fall Precaution Booklet Issued: Yes (comment) Precaution Comments: Reviewed precautions Restrictions Weight Bearing Restrictions: Yes LLE Weight Bearing: Partial weight bearing LLE Partial Weight Bearing Percentage or Pounds: 50    Mobility  Bed Mobility               General bed mobility comments: Up in chair upon PT arrival.   Transfers Overall transfer level: Needs assistance Equipment used: Rolling walker (2 wheeled) Transfers: Sit to/from Stand Sit to Stand: Mod assist         General transfer comment: Assist to power to standing with cues for hand placement/technique.   Ambulation/Gait Ambulation/Gait assistance: Min guard Ambulation Distance (Feet): 60 Feet Assistive device: Rolling walker (2 wheeled) Gait Pattern/deviations: Step-through pattern;Decreased step length - right;Decreased step length - left;Decreased weight shift to left;Decreased dorsiflexion - left;Antalgic;Trunk flexed Gait velocity: Decreased Gait velocity interpretation: Below normal speed for  age/gender General Gait Details: Slow, antalgic gait secondary to post op pain and weakness. Not compliant with PWB status. HR up to 135 bpm irregular rhythm.   Stairs            Wheelchair Mobility    Modified Rankin (Stroke Patients Only)       Balance Overall balance assessment: Needs assistance Sitting-balance support: Bilateral upper extremity supported;Feet unsupported Sitting balance-Leahy Scale: Fair     Standing balance support: Bilateral upper extremity supported;During functional activity Standing balance-Leahy Scale: Poor Standing balance comment: Reliant on RW for support                            Cognition Arousal/Alertness: Awake/alert Behavior During Therapy: WFL for tasks assessed/performed Overall Cognitive Status: Within Functional Limits for tasks assessed                                        Exercises Total Joint Exercises Quad Sets: Both;10 reps;Seated;Strengthening Towel Squeeze: Both;10 reps;Seated;Strengthening Heel Slides: Left;5 reps;Seated;Strengthening (20 sec hold) Hip ABduction/ADduction: Left;10 reps;Seated;Strengthening Goniometric ROM: 8-80 degrees knee AROM    General Comments General comments (skin integrity, edema, etc.): Sister present during session.      Pertinent Vitals/Pain Pain Assessment: Faces Faces Pain Scale: Hurts a little bit Pain Location: L knee  Pain Descriptors / Indicators: Aching;Operative site guarding;Sore Pain Intervention(s): Monitored during session;Repositioned    Home Living                      Prior Function  PT Goals (current goals can now be found in the care plan section) Progress towards PT goals: Progressing toward goals    Frequency    7X/week      PT Plan Current plan remains appropriate    Co-evaluation              AM-PAC PT "6 Clicks" Daily Activity  Outcome Measure  Difficulty turning over in bed (including  adjusting bedclothes, sheets and blankets)?: Total Difficulty moving from lying on back to sitting on the side of the bed? : Total Difficulty sitting down on and standing up from a chair with arms (e.g., wheelchair, bedside commode, etc,.)?: Total Help needed moving to and from a bed to chair (including a wheelchair)?: A Little Help needed walking in hospital room?: A Little Help needed climbing 3-5 steps with a railing? : A Lot 6 Click Score: 11    End of Session Equipment Utilized During Treatment: Gait belt Activity Tolerance: Patient tolerated treatment well Patient left: in chair;with call bell/phone within reach;with family/visitor present Nurse Communication: Mobility status PT Visit Diagnosis: Other abnormalities of gait and mobility (R26.89);Muscle weakness (generalized) (M62.81);Pain;Difficulty in walking, not elsewhere classified (R26.2) Pain - Right/Left: Left Pain - part of body: Knee     Time: 1425-1451 PT Time Calculation (min) (ACUTE ONLY): 26 min  Charges:  $Gait Training: 8-22 mins $Therapeutic Exercise: 8-22 mins                    G Codes:       Wray Kearns, PT, DPT [phone]     Lauren Richard 04/28/2017, 2:58 PM

## 2017-04-28 NOTE — Progress Notes (Signed)
Pt ready for d/c to SNF today per MD. Report was called to [facility] at [facility], all questions answered. Pt waiting for transportation to facility via [facility].   [name], [name]

## 2017-04-28 NOTE — Social Work (Signed)
Insurance [phone] for 7days starting today.  CSW f/u with P.A. And he indicated patient can DC today.  Elissa Hefty, LCSW Clinical Social Worker [phone]

## 2017-04-28 NOTE — Clinical Social Work Placement (Signed)
   CLINICAL SOCIAL WORK PLACEMENT  NOTE  Date:  04/28/2017  Patient Details  Name: Lauren Richard MRN: [id number] Date of Birth: 08-04-1936  Clinical Social Work is seeking post-discharge placement for this patient at the [place] level of care (*CSW will initial, date and re-position this form in  chart as items are completed):  Yes   Patient/family provided with [facility] Work Department's list of facilities offering this level of care within the geographic area requested by the patient (or if unable, by the patient's family).  Yes   Patient/family informed of their freedom to choose among providers that offer the needed level of care, that participate in Medicare, Medicaid or managed care program needed by the patient, have an available bed and are willing to accept the patient.  Yes   Patient/family informed of [facility]'s ownership interest in [facility] and [facility], as well as of the fact that they are under no obligation to receive care at these facilities.  PASRR submitted to EDS on       PASRR number received on 04/27/17     Existing PASRR number confirmed on       FL2 transmitted to all facilities in geographic area requested by pt/family on 04/27/17     FL2 transmitted to all facilities within larger geographic area on 04/27/17     Patient informed that his/her managed care company has contracts with or will negotiate with certain facilities, including the following:        Yes   Patient/family informed of bed offers received.  Patient chooses bed at [facility]     Physician recommends and patient chooses bed at      Patient to be transferred to [facility] on 04/28/17.  Patient to be transferred to facility by ptar     Patient family notified on 04/28/17 of transfer.  Name of family member notified:  family at bedside     PHYSICIAN Please prepare priority discharge summary, including medications, Please  prepare prescriptions, Please sign FL2     Additional Comment:    _______________________________________________ Normajean Baxter, LCSW 04/28/2017, 2:30 PM

## 2017-04-28 NOTE — Social Work (Addendum)
Clinical Social Worker facilitated patient discharge including contacting patient family and facility to confirm patient discharge plans.  Clinical information faxed to facility and family agreeable with plan.    CSW arranged ambulance transport via [facility] (evening# [phone]) to [facility] for 6:00pm.    RN to call [phone] to give report prior to discharge.  Clinical Social Worker will sign off for now as social work intervention is no longer needed. Please consult [place] again if new need arises.  Elissa Hefty, LCSW Clinical Social Worker [phone]

## 2017-04-29 DIAGNOSIS — I10 Essential (primary) hypertension: Secondary | ICD-10-CM | POA: Diagnosis not present

## 2017-04-29 DIAGNOSIS — K5900 Constipation, unspecified: Secondary | ICD-10-CM | POA: Diagnosis not present

## 2017-04-29 DIAGNOSIS — E785 Hyperlipidemia, unspecified: Secondary | ICD-10-CM | POA: Diagnosis not present

## 2017-04-29 DIAGNOSIS — M25569 Pain in unspecified knee: Secondary | ICD-10-CM | POA: Diagnosis not present

## 2017-05-04 DIAGNOSIS — I10 Essential (primary) hypertension: Secondary | ICD-10-CM | POA: Diagnosis not present

## 2017-05-04 DIAGNOSIS — M25569 Pain in unspecified knee: Secondary | ICD-10-CM | POA: Diagnosis not present

## 2017-05-05 DIAGNOSIS — I10 Essential (primary) hypertension: Secondary | ICD-10-CM | POA: Diagnosis not present

## 2017-05-05 DIAGNOSIS — Z96652 Presence of left artificial knee joint: Secondary | ICD-10-CM | POA: Diagnosis not present

## 2017-05-05 DIAGNOSIS — R5381 Other malaise: Secondary | ICD-10-CM | POA: Diagnosis not present

## 2017-05-05 DIAGNOSIS — E785 Hyperlipidemia, unspecified: Secondary | ICD-10-CM | POA: Diagnosis not present

## 2017-05-06 NOTE — Telephone Encounter (Signed)
na

## 2017-05-09 ENCOUNTER — Ambulatory Visit (INDEPENDENT_AMBULATORY_CARE_PROVIDER_SITE_OTHER): Payer: PPO

## 2017-05-09 ENCOUNTER — Ambulatory Visit (INDEPENDENT_AMBULATORY_CARE_PROVIDER_SITE_OTHER): Payer: PPO | Admitting: Orthopaedic Surgery

## 2017-05-09 ENCOUNTER — Encounter (INDEPENDENT_AMBULATORY_CARE_PROVIDER_SITE_OTHER): Payer: Self-pay | Admitting: Orthopaedic Surgery

## 2017-05-09 VITALS — BP 133/70 | HR 88 | Resp 12 | Ht 67.0 in | Wt 178.0 lb

## 2017-05-09 DIAGNOSIS — Z96652 Presence of left artificial knee joint: Secondary | ICD-10-CM | POA: Diagnosis not present

## 2017-05-09 NOTE — Progress Notes (Signed)
Post-Op Visit Note   Patient: Lauren Richard           Date of Birth: 06-24-1936           MRN: [id number] Visit Date: 05/09/2017 PCP: Glendale Chard, MD   Assessment & Plan:  Chief Complaint:  Chief Complaint  Patient presents with  . Left Knee - Routine Post Op    Ms. Vandeven is 2 weeks status post Left TKA. She is in rehab at [facility], she ambulates with a walker. Staples removed, steri strips applied    Visit Diagnoses:  1. Presence of left artificial knee joint   Doing well postop. Still in rehabilitation but like to go home tomorrow. Doing well in physical therapy and only taking pain meds when she is in therapy. Denies shortness of breath or chest pain. Taking aspirin for DVT prophylaxis  Plan: Weightbearing as tolerated. Office 2 weeks. Range of motion is 0 to about 95 without instability. Her wound is healing nicely without evidence of infection. The Steri-Strips were applied after staples removed. Neurovascular exam intact. No calf pain  Follow-Up Instructions: No Follow-up on file.   Orders:  Orders Placed This Encounter  Procedures  . XR KNEE 3 VIEW LEFT   No orders of the defined types were placed in this encounter.   Imaging: Xr Knee 3 View Left  Result Date: 05/09/2017 Films of the left total knee replacement were obtained in 3 projections standing. There is approximately 3 of valgus. Components are in excellent position with increased glue beneath the lateral tibial plate. Is results in slight increased valgus but clinically doesn't appear to be significant. Patella tracks in the midline. No evidence of fracture.   PMFS History: Patient Active Problem List   Diagnosis Date Noted  . Osteoarthritis of left knee 04/26/2017  . S/P total knee replacement using cement, left 04/26/2017  . Essential hypertension 03/03/2017  . Hyperlipidemia 03/03/2017  . Osteoarthritis 03/03/2017  . Obesity 03/03/2017  . Status post left foot surgery 12/03/2015  .  Hammertoe 11/03/2015   Past Medical History:  Diagnosis Date  . Arthritis    "all over" (04/26/2017)  . Essential hypertension 03/03/2017  . History of blood transfusion 1979   "when I had my hysterectomy"  . Hyperlipidemia   . Obesity 03/03/2017  . Osteoarthritis 03/03/2017    Family History  Problem Relation Age of Onset  . Kidney cancer Mother   . Lung cancer Father   . Congestive Heart Failure Sister   . Asthma Sister   . Atrial fibrillation Sister   . Hypertension Sister   . Diabetes Sister     Past Surgical History:  Procedure Laterality Date  . BREAST CYST EXCISION Right    "cut 6 cysts out"  . CYST EXCISION Left    "off my toe"  . DILATION AND CURETTAGE OF UTERUS     "before hysterectomy"  . INGUINAL HERNIA REPAIR Left   . JOINT REPLACEMENT    . KNEE ARTHROSCOPY Bilateral   . REDUCTION MAMMAPLASTY Bilateral 1980  . TONSILLECTOMY     "when I small"  . TOTAL ABDOMINAL HYSTERECTOMY  1979  . TOTAL KNEE ARTHROPLASTY Right 04/26/2017  . TOTAL KNEE ARTHROPLASTY Left 04/26/2017   Procedure: LEFT TOTAL KNEE ARTHROPLASTY;  Surgeon: [name], MD;  Location: [facility];  Service: Orthopedics;  Laterality: Left;   Social History   Occupational History  . Not on file.   Social History Main Topics  . Smoking status:  Never Smoker  . Smokeless tobacco: Never Used  . Alcohol use 0.0 oz/week     Comment: 04/26/2017 "might have 1 drink/month; if that"  . Drug use: No  . Sexual activity: No

## 2017-05-11 DIAGNOSIS — Z96652 Presence of left artificial knee joint: Secondary | ICD-10-CM | POA: Diagnosis not present

## 2017-05-14 DIAGNOSIS — Z6829 Body mass index (BMI) 29.0-29.9, adult: Secondary | ICD-10-CM | POA: Diagnosis not present

## 2017-05-14 DIAGNOSIS — E669 Obesity, unspecified: Secondary | ICD-10-CM | POA: Diagnosis not present

## 2017-05-14 DIAGNOSIS — Z96652 Presence of left artificial knee joint: Secondary | ICD-10-CM | POA: Diagnosis not present

## 2017-05-14 DIAGNOSIS — E785 Hyperlipidemia, unspecified: Secondary | ICD-10-CM | POA: Diagnosis not present

## 2017-05-14 DIAGNOSIS — I4891 Unspecified atrial fibrillation: Secondary | ICD-10-CM | POA: Diagnosis not present

## 2017-05-14 DIAGNOSIS — I10 Essential (primary) hypertension: Secondary | ICD-10-CM | POA: Diagnosis not present

## 2017-05-14 DIAGNOSIS — Z471 Aftercare following joint replacement surgery: Secondary | ICD-10-CM | POA: Diagnosis not present

## 2017-05-14 DIAGNOSIS — Z9181 History of falling: Secondary | ICD-10-CM | POA: Diagnosis not present

## 2017-05-23 ENCOUNTER — Encounter (INDEPENDENT_AMBULATORY_CARE_PROVIDER_SITE_OTHER): Payer: Self-pay | Admitting: Orthopaedic Surgery

## 2017-05-23 ENCOUNTER — Ambulatory Visit (INDEPENDENT_AMBULATORY_CARE_PROVIDER_SITE_OTHER): Payer: PPO | Admitting: Orthopaedic Surgery

## 2017-05-23 VITALS — BP 128/70 | HR 98 | Resp 14 | Ht 68.0 in | Wt 165.0 lb

## 2017-05-23 DIAGNOSIS — Z96652 Presence of left artificial knee joint: Secondary | ICD-10-CM

## 2017-05-23 NOTE — Progress Notes (Signed)
Office Visit Note   Patient: Lauren Richard           Date of Birth: Feb 18, 1936           MRN: [id number] Visit Date: 05/23/2017              Requested by: Lauren Richard, [address] [place], [place] [address] PCP: Lauren Chard, MD   Assessment & Plan: Visit Diagnoses:  1. History of left knee replacement     Plan: Nearly 1 month status post primary left total knee replacement doing well. Not using any ambulatory aid or taking any pain medicines and encourage exercises and return in 3 months  Follow-Up Instructions: Return in about 3 months (around 08/23/2017).   Orders:  No orders of the defined types were placed in this encounter.  No orders of the defined types were placed in this encounter.     Procedures: No procedures performed   Clinical Data: No additional findings.   Subjective: Chief Complaint  Patient presents with  . Left Knee - Routine Post Op    Lauren Richard is a 81 y o that is 3 1/2 weeks status post L TKA.  She relates she completed PT and doing well. Tramadol PRN  Lauren Richard is doing very well nearly a month status post left total knee replacement. She doesn't use any ambulatory aid and very happy with her present course. She's not had any bowel or bladder changes, numbness or tingling shortness of breath or chest pain. Only on occasion that she take a tramadol. She is doing her exercises. She feels much better than she did preoperatively  HPI  Review of Systems  Constitutional: Negative for chills, fatigue and fever.  Eyes: Negative for itching.  Respiratory: Negative for chest tightness and shortness of breath.   Cardiovascular: Negative for chest pain, palpitations and leg swelling.  Gastrointestinal: Negative for blood in stool, constipation and diarrhea.  Musculoskeletal: Positive for back pain. Negative for joint swelling, neck pain and neck stiffness.  Neurological: Negative for dizziness, weakness, numbness and headaches.    Hematological: Does not bruise/bleed easily.  Psychiatric/Behavioral: Negative for sleep disturbance. The patient is not nervous/anxious.      Objective: Vital Signs: BP 128/70   Pulse 98   Resp 14   Ht 5\' 8"  (1.727 m)   Wt 165 lb (74.8 kg)   BMI 25.09 kg/m   Physical Exam  Ortho Exam left knee with a well-healed incision. Minimal swelling. No instability with a varus or valgus stress. Full extension and about 104 of flexion with a goniometer. No calf pain. No distal edema. Neurovascular exam intact.  Specialty Comments:  No specialty comments available.  Imaging: No results found.   PMFS History: Patient Active Problem List   Diagnosis Date Noted  . Osteoarthritis of left knee 04/26/2017  . S/P total knee replacement using cement, left 04/26/2017  . Essential hypertension 03/03/2017  . Hyperlipidemia 03/03/2017  . Osteoarthritis 03/03/2017  . Obesity 03/03/2017  . Status post left foot surgery 12/03/2015  . Hammertoe 11/03/2015   Past Medical History:  Diagnosis Date  . Arthritis    "all over" (04/26/2017)  . Essential hypertension 03/03/2017  . History of blood transfusion 1979   "when I had my hysterectomy"  . Hyperlipidemia   . Obesity 03/03/2017  . Osteoarthritis 03/03/2017    Family History  Problem Relation Age of Onset  . Kidney cancer Mother   . Lung cancer Father   .  Congestive Heart Failure Sister   . Asthma Sister   . Atrial fibrillation Sister   . Hypertension Sister   . Diabetes Sister     Past Surgical History:  Procedure Laterality Date  . BREAST CYST EXCISION Right    "cut 6 cysts out"  . CYST EXCISION Left    "off my toe"  . DILATION AND CURETTAGE OF UTERUS     "before hysterectomy"  . INGUINAL HERNIA REPAIR Left   . JOINT REPLACEMENT    . KNEE ARTHROSCOPY Bilateral   . REDUCTION MAMMAPLASTY Bilateral 1980  . TONSILLECTOMY     "when I small"  . TOTAL ABDOMINAL HYSTERECTOMY  1979  . TOTAL KNEE ARTHROPLASTY Right 04/26/2017  .  TOTAL KNEE ARTHROPLASTY Left 04/26/2017   Procedure: LEFT TOTAL KNEE ARTHROPLASTY;  Surgeon: [name], MD;  Location: [facility];  Service: Orthopedics;  Laterality: Left;   Social History   Occupational History  . Not on file.   Social History Main Topics  . Smoking status: Never Smoker  . Smokeless tobacco: Never Used  . Alcohol use 0.0 oz/week     Comment: 04/26/2017 "might have 1 drink/month; if that"  . Drug use: No  . Sexual activity: No

## 2017-06-07 NOTE — Progress Notes (Signed)
Cardiology Office Note   Date:  06/08/2017   ID:  Richard, Lauren 10-Mar-1936, MRN [id number]  PCP:  Glendale Chard, MD  Cardiologist:   Skeet Latch, MD   No chief complaint on file.   History of Present Illness: Lauren Richard is a 81 y.o. female with hypertension and hyperlipidemia here for follow up.  She was initially seen 02/2017 for pre-surgical risk assessment prior to L knee replacement.  She had previously been diagnosed with atrial fibrillation. However upon review of the EKGs was actually sinus rhythm with frequent PACs. Therefore she was not started on anticoagulation.  An echo was ordered due to a systolic murmur noted in clinic, but this has not been performed.  Lauren Richard underwent L knee replacement 0/9983 without complication.  She was discharged to rehabilitation and then had home physical therapy. She has one more week of physical therapy and will graduate from the program. She has been feeling very well and denies any chest pain or shortness of breath. She has not noted any palpitations, lightheadedness, dizziness, lower extremity edema, orthopnea, or PND.  She hasn't been getting any formal exercise other than physical therapy, the plan is to start back walking once she finishes with her therapist.  Past Medical History:  Diagnosis Date  . Arthritis    "all over" (04/26/2017)  . Essential hypertension 03/03/2017  . History of blood transfusion 1979   "when I had my hysterectomy"  . Hyperlipidemia   . Obesity 03/03/2017  . Osteoarthritis 03/03/2017    Past Surgical History:  Procedure Laterality Date  . BREAST CYST EXCISION Right    "cut 6 cysts out"  . CYST EXCISION Left    "off my toe"  . DILATION AND CURETTAGE OF UTERUS     "before hysterectomy"  . INGUINAL HERNIA REPAIR Left   . JOINT REPLACEMENT    . KNEE ARTHROSCOPY Bilateral   . REDUCTION MAMMAPLASTY Bilateral 1980  . TONSILLECTOMY     "when I small"  . TOTAL ABDOMINAL HYSTERECTOMY  1979  .  TOTAL KNEE ARTHROPLASTY Right 04/26/2017  . TOTAL KNEE ARTHROPLASTY Left 04/26/2017   Procedure: LEFT TOTAL KNEE ARTHROPLASTY;  Surgeon: [name], MD;  Location: [facility];  Service: Orthopedics;  Laterality: Left;     Current Outpatient Prescriptions  Medication Sig Dispense Refill  . amLODipine (NORVASC) 5 MG tablet Take 5 mg by mouth daily.     Marland Kitchen atorvastatin (LIPITOR) 20 MG tablet Take 20 mg by mouth daily.    . Cholecalciferol (VITAMIN D) 2000 units tablet Take 2,000 Units by mouth daily.    Marland Kitchen losartan-hydrochlorothiazide (HYZAAR) 100-25 MG tablet Take 1 tablet by mouth daily.     . potassium chloride (K-DUR) 10 MEQ tablet Take 10 mEq by mouth daily.     . traMADol (ULTRAM) 50 MG tablet Take 100 mg by mouth 2 (two) times daily.      No current facility-administered medications for this visit.     Allergies:   Patient has no known allergies.    Social History:  The patient  reports that she has never smoked. She has never used smokeless tobacco. She reports that she drinks alcohol. She reports that she does not use drugs.   Family History:  The patient's family history includes Asthma in her sister; Atrial fibrillation in her sister; Congestive Heart Failure in her sister; Diabetes in her sister; Hypertension in her sister; Kidney cancer in her mother; Lung cancer in her father.  ROS:  Please see the history of present illness.   Otherwise, review of systems are positive for none.   All other systems are reviewed and negative.    PHYSICAL EXAM: VS:  BP 130/84   Pulse 92   Ht 5\' 7"  (1.702 m)   Wt 88.6 kg (195 lb 6.4 oz)   SpO2 100%   BMI 30.60 kg/m  , BMI Body mass index is 30.6 kg/m. GENERAL:  Well appearing HEENT: Pupils equal round and reactive, fundi not visualized, oral mucosa unremarkable NECK:  No jugular venous distention, waveform within normal limits, carotid upstroke brisk and symmetric, no bruits, no thyromegaly LYMPHATICS:  No cervical  adenopathy LUNGS:  Clear to auscultation bilaterally HEART:  RRR.  PMI not displaced or sustained,S1 and S2 within normal limits, no S3, no S4, no clicks, no rubs, I/VI systolic murmur at the LUSB ABD:  Flat, positive bowel sounds normal in frequency in pitch, no bruits, no rebound, no guarding, no midline pulsatile mass, no hepatomegaly, no splenomegaly EXT:  2 plus pulses throughout, no edema, no cyanosis no clubbing SKIN:  No rashes no nodules NEURO:  Cranial nerves II through XII grossly intact, motor grossly intact throughout PSYCH:  Cognitively intact, oriented to person place and time  EKG:  EKG is not ordered today. The ekg ordered today demonstrates sinus tachycardia. Rate 107 bpm. 01/24/17. Sinus rhythm with frequent PACs. Rate 96 bpm.  Recent Labs: 04/15/2017: ALT 15 04/28/2017: BUN 11; Creatinine, Ser 0.82; Hemoglobin 10.7; Platelets 240; Potassium 3.7; Sodium 134    Lipid Panel No results found for: CHOL, TRIG, HDL, CHOLHDL, VLDL, LDLCALC, LDLDIRECT    Wt Readings from Last 3 Encounters:  06/08/17 88.6 kg (195 lb 6.4 oz)  05/23/17 74.8 kg (165 lb)  05/09/17 80.7 kg (178 lb)      ASSESSMENT AND PLAN:  # PACs: Lauren Richard Prior EKG showed sinus rhythm with PACs. She has no history of atrial fibrillation.  # Hypertension: Blood pressure is controlled.  Continue amlodipine, losartan, and hydrochlorothiazide.  # Hyperlipidemia:  Continue atorvastatin.  # Murmur: Lauren Richard was noted to have a soft systolic murmur on exam.  She is completely asymptomatic and her murmur is very mild. We will not pursue echocardiography as previously ordered.  Current medicines are reviewed at length with the patient today.  The patient does not have concerns regarding medicines.  The following changes have been made:  no change  Labs/ tests ordered today include:   No orders of the defined types were placed in this encounter.    Disposition:   FU with Armanie Ullmer C. Oval Linsey, MD, [facility] as  needed.   This note was written with the assistance of speech recognition software.  Please excuse any transcriptional errors.  Signed, Anavey Coombes C. Oval Linsey, MD, [facility]  06/08/2017 9:47 AM    [place]

## 2017-06-08 ENCOUNTER — Ambulatory Visit (INDEPENDENT_AMBULATORY_CARE_PROVIDER_SITE_OTHER): Payer: PPO | Admitting: Cardiovascular Disease

## 2017-06-08 ENCOUNTER — Encounter: Payer: Self-pay | Admitting: *Deleted

## 2017-06-08 ENCOUNTER — Encounter: Payer: Self-pay | Admitting: Cardiovascular Disease

## 2017-06-08 VITALS — BP 130/84 | HR 92 | Ht 67.0 in | Wt 195.4 lb

## 2017-06-08 DIAGNOSIS — E7800 Pure hypercholesterolemia, unspecified: Secondary | ICD-10-CM

## 2017-06-08 DIAGNOSIS — R011 Cardiac murmur, unspecified: Secondary | ICD-10-CM

## 2017-06-08 DIAGNOSIS — I491 Atrial premature depolarization: Secondary | ICD-10-CM

## 2017-06-08 DIAGNOSIS — I10 Essential (primary) hypertension: Secondary | ICD-10-CM

## 2017-06-08 NOTE — Patient Instructions (Signed)
Dr Oval Linsey recommends that you follow-up with her as needed.

## 2017-06-16 ENCOUNTER — Telehealth (INDEPENDENT_AMBULATORY_CARE_PROVIDER_SITE_OTHER): Payer: Self-pay | Admitting: Orthopaedic Surgery

## 2017-06-16 NOTE — Telephone Encounter (Signed)
Patient is requesting refills of tramadol and a muscle relaxer (patient did not know name of muscle relaxer). [facility] on [address]. Please call patient after rx is sent.

## 2017-06-17 ENCOUNTER — Other Ambulatory Visit (INDEPENDENT_AMBULATORY_CARE_PROVIDER_SITE_OTHER): Payer: Self-pay | Admitting: Orthopedic Surgery

## 2017-06-17 MED ORDER — METHOCARBAMOL 500 MG PO TABS: 500.0000 mg | ORAL_TABLET | Freq: Three times a day (TID) | ORAL | 0 refills | Status: DC | PRN

## 2017-06-17 MED ORDER — TRAMADOL HCL 50 MG PO TABS: 50.0000 mg | ORAL_TABLET | Freq: Four times a day (QID) | ORAL | 0 refills | Status: DC | PRN

## 2017-06-17 NOTE — Telephone Encounter (Signed)
OK to do-

## 2017-06-17 NOTE — Telephone Encounter (Signed)
Done and printed

## 2017-06-20 ENCOUNTER — Telehealth (INDEPENDENT_AMBULATORY_CARE_PROVIDER_SITE_OTHER): Payer: Self-pay | Admitting: Orthopaedic Surgery

## 2017-06-20 NOTE — Telephone Encounter (Signed)
PLease advise

## 2017-06-20 NOTE — Telephone Encounter (Signed)
Per patient muscle relaxer needs prior authorization with insurance. Please call to advise patient. Also, patient wants to know why # for Tramadol was decreased to 30.

## 2017-06-21 NOTE — Telephone Encounter (Signed)
I did that Friday... WHat did you do with them??

## 2017-06-22 NOTE — Telephone Encounter (Signed)
Sent PA to pharmacy

## 2017-07-18 ENCOUNTER — Telehealth (INDEPENDENT_AMBULATORY_CARE_PROVIDER_SITE_OTHER): Payer: Self-pay | Admitting: Orthopaedic Surgery

## 2017-07-18 ENCOUNTER — Other Ambulatory Visit (INDEPENDENT_AMBULATORY_CARE_PROVIDER_SITE_OTHER): Payer: Self-pay

## 2017-07-18 MED ORDER — TRAMADOL HCL 50 MG PO TABS: 50.0000 mg | ORAL_TABLET | Freq: Two times a day (BID) | ORAL | 0 refills | Status: DC | PRN

## 2017-07-18 NOTE — Telephone Encounter (Signed)
Faxed to prarmacy

## 2017-07-18 NOTE — Telephone Encounter (Signed)
[place] for renew

## 2017-07-18 NOTE — Telephone Encounter (Signed)
Pt had  L TKA on 04/26/17. She received Tramadol 06/16/17

## 2017-07-18 NOTE — Telephone Encounter (Signed)
Patient calling due to being out of Tramadol. Patient wants more because other knee is bad also. Patient uses [facility] on [address]. Please call to advise.

## 2017-07-18 NOTE — Telephone Encounter (Signed)
Patient last Rx was 9/

## 2017-08-11 DIAGNOSIS — Z23 Encounter for immunization: Secondary | ICD-10-CM | POA: Diagnosis not present

## 2017-08-11 DIAGNOSIS — Z79899 Other long term (current) drug therapy: Secondary | ICD-10-CM | POA: Diagnosis not present

## 2017-08-11 DIAGNOSIS — I10 Essential (primary) hypertension: Secondary | ICD-10-CM | POA: Diagnosis not present

## 2017-08-11 DIAGNOSIS — Z0000 Encounter for general adult medical examination without abnormal findings: Secondary | ICD-10-CM | POA: Diagnosis not present

## 2017-08-11 DIAGNOSIS — M545 Low back pain: Secondary | ICD-10-CM | POA: Diagnosis not present

## 2017-08-15 ENCOUNTER — Ambulatory Visit: Payer: PPO | Admitting: Rheumatology

## 2017-08-22 ENCOUNTER — Encounter (INDEPENDENT_AMBULATORY_CARE_PROVIDER_SITE_OTHER): Payer: Self-pay | Admitting: Orthopaedic Surgery

## 2017-08-22 ENCOUNTER — Ambulatory Visit (INDEPENDENT_AMBULATORY_CARE_PROVIDER_SITE_OTHER): Payer: PPO | Admitting: Orthopaedic Surgery

## 2017-08-22 VITALS — BP 125/73 | HR 87 | Resp 14 | Ht 65.0 in | Wt 194.0 lb

## 2017-08-22 DIAGNOSIS — Z96652 Presence of left artificial knee joint: Secondary | ICD-10-CM

## 2017-08-22 MED ORDER — TRAMADOL HCL 50 MG PO TABS: 50.0000 mg | ORAL_TABLET | Freq: Two times a day (BID) | ORAL | 0 refills | Status: DC | PRN

## 2017-08-22 NOTE — Progress Notes (Signed)
Office Visit Note   Patient: Lauren Richard           Date of Birth: 1936-03-14           MRN: [id number] Visit Date: 08/22/2017              Requested by: Glendale Chard, [address] [place], [place] [address] PCP: Glendale Chard, MD   Assessment & Plan: Visit Diagnoses:  1. History of left knee replacement     Plan: Nearly 4 months status post primary left total knee replacement doing well. Has developed some back pain. Not exercising. I really encouraged her to join the [facility] for both her back and her lower extremities. We'll plan to see her back in 6 months. We'll give her 1 prescription for tramadol. I think exercises will be the key to her success. I'm just concerned that she  may not participate in an active exercise regimen  Follow-Up Instructions: Return in about 6 months (around 02/19/2018).   Orders:  No orders of the defined types were placed in this encounter.  Meds ordered this encounter  Medications  . traMADol (ULTRAM) 50 MG tablet    Sig: Take 1 tablet (50 mg total) every 12 (twelve) hours as needed by mouth.    Dispense:  30 tablet    Refill:  0      Procedures: No procedures performed   Clinical Data: No additional findings.   Subjective: Chief Complaint  Patient presents with  . Left Knee - Routine Post Op    Ms. Lauren Richard is a 81 y o S/P 3 1/2 months Left knee replacement. She relates she is doing well. Her right knee hurts today and she wants an injection.  Feeling much better since she had her left knee replaced but still having some achiness and soreness. Really deconditioned. We can her both lower extremities. Also was developed some mild low back pain without radiculopathy. Previously diagnosed through another office with arthritis. She really needs to work on her exercises  HPI  Review of Systems  Constitutional: Negative for chills, fatigue and fever.  Eyes: Negative for itching.  Respiratory: Negative for chest tightness and  shortness of breath.   Cardiovascular: Negative for chest pain, palpitations and leg swelling.  Gastrointestinal: Negative for blood in stool, constipation and diarrhea.  Endocrine: Negative for polyuria.  Genitourinary: Negative for dysuria.  Musculoskeletal: Positive for back pain. Negative for joint swelling, neck pain and neck stiffness.  Allergic/Immunologic: Negative for immunocompromised state.  Neurological: Negative for dizziness and numbness.  Hematological: Does not bruise/bleed easily.  Psychiatric/Behavioral: Positive for sleep disturbance. The patient is not nervous/anxious.      Objective: Vital Signs: BP 125/73   Pulse 87   Resp 14   Ht 5\' 5"  (1.651 m)   Wt 194 lb (88 kg)   BMI 32.28 kg/m   Physical Exam  Ortho Exam awake alert and oriented 3. Comfortable sitting. Straight leg raise negative bilaterally painless range of motion both hips. Left knee without increased heat. Full extension and flexion almost 105 without instability. No calf pain. No popliteal fullness. Neurovascular exam intact distally. Not using any ambulatory aid.  Specialty Comments:  No specialty comments available.  Imaging: No results found.   PMFS History: Patient Active Problem List   Diagnosis Date Noted  . Osteoarthritis of left knee 04/26/2017  . S/P total knee replacement using cement, left 04/26/2017  . Essential hypertension 03/03/2017  . Hyperlipidemia 03/03/2017  .  Osteoarthritis 03/03/2017  . Obesity 03/03/2017  . Status post left foot surgery 12/03/2015  . Hammertoe 11/03/2015   Past Medical History:  Diagnosis Date  . Arthritis    "all over" (04/26/2017)  . Essential hypertension 03/03/2017  . History of blood transfusion 1979   "when I had my hysterectomy"  . Hyperlipidemia   . Obesity 03/03/2017  . Osteoarthritis 03/03/2017    Family History  Problem Relation Age of Onset  . Kidney cancer Mother   . Lung cancer Father   . Congestive Heart Failure Sister   .  Asthma Sister   . Atrial fibrillation Sister   . Hypertension Sister   . Diabetes Sister     Past Surgical History:  Procedure Laterality Date  . BREAST CYST EXCISION Right    "cut 6 cysts out"  . CYST EXCISION Left    "off my toe"  . DILATION AND CURETTAGE OF UTERUS     "before hysterectomy"  . INGUINAL HERNIA REPAIR Left   . JOINT REPLACEMENT    . KNEE ARTHROSCOPY Bilateral   . REDUCTION MAMMAPLASTY Bilateral 1980  . TONSILLECTOMY     "when I small"  . TOTAL ABDOMINAL HYSTERECTOMY  1979  . TOTAL KNEE ARTHROPLASTY Right 04/26/2017   Social History   Occupational History  . Not on file  Tobacco Use  . Smoking status: Never Smoker  . Smokeless tobacco: Never Used  Substance and Sexual Activity  . Alcohol use: Yes    Alcohol/week: 0.0 oz    Comment: 04/26/2017 "might have 1 drink/month; if that"  . Drug use: No  . Sexual activity: No

## 2017-09-10 NOTE — Progress Notes (Signed)
Office Visit Note  Patient: Lauren Richard             Date of Birth: 1936-08-12           MRN: [id number]             PCP: Glendale Chard, MD Referring: Glendale Chard, MD Visit Date: 09/21/2017 Occupation: @GUAROCC @    Subjective:  Pain of the Lower Back and Medication Management   History of Present Illness: Lauren Richard is a 81 y.o. female with history of osteoarthritis and disc disease. She had left total knee replacement by Dr. Durward Fortes in July 2018. He's been recovering gradually from that. She states since she had epidural for her surgery she's been having increased lower back pain. She continues to have some discomfort in her right knee joint. She plans to have surgery on her right knee next year. Her left foot is better. Her neck gets a stiff off-and-on.  Activities of Daily Living:  Patient reports morning stiffness for 5 minutes.   Patient Denies nocturnal pain.  Difficulty dressing/grooming: Denies Difficulty climbing stairs: Reports Difficulty getting out of chair: Reports Difficulty using hands for taps, buttons, cutlery, and/or writing: Denies   Review of Systems  Constitutional: Positive for fatigue. Negative for night sweats, weight gain, weight loss and weakness.  HENT: Negative for mouth sores, trouble swallowing, trouble swallowing, mouth dryness and nose dryness.   Eyes: Negative for pain, redness, visual disturbance and dryness.  Respiratory: Negative for cough, shortness of breath and difficulty breathing.   Cardiovascular: Positive for hypertension. Negative for chest pain, palpitations, irregular heartbeat and swelling in legs/feet.  Gastrointestinal: Negative for blood in stool, constipation and diarrhea.  Endocrine: Negative for increased urination.  Genitourinary: Negative for vaginal dryness.  Musculoskeletal: Positive for arthralgias and joint pain. Negative for joint swelling, myalgias, muscle weakness, morning stiffness, muscle tenderness and  myalgias.  Skin: Negative for color change, rash, hair loss, skin tightness, ulcers and sensitivity to sunlight.  Allergic/Immunologic: Negative for susceptible to infections.  Neurological: Negative for dizziness, memory loss and night sweats.  Hematological: Negative for swollen glands.  Psychiatric/Behavioral: Positive for sleep disturbance. Negative for depressed mood. The patient is not nervous/anxious.     PMFS History:  Patient Active Problem List   Diagnosis Date Noted  . Osteoarthritis of left knee 04/26/2017  . S/P total knee replacement using cement, left 04/26/2017  . Essential hypertension 03/03/2017  . Hyperlipidemia 03/03/2017  . Osteoarthritis 03/03/2017  . Obesity 03/03/2017  . Status post left foot surgery 12/03/2015  . Hammertoe 11/03/2015    Past Medical History:  Diagnosis Date  . Arthritis    "all over" (04/26/2017)  . Essential hypertension 03/03/2017  . History of blood transfusion 1979   "when I had my hysterectomy"  . Hyperlipidemia   . Obesity 03/03/2017  . Osteoarthritis 03/03/2017    Family History  Problem Relation Age of Onset  . Kidney cancer Mother   . Lung cancer Father   . Congestive Heart Failure Sister   . Asthma Sister   . Atrial fibrillation Sister   . Hypertension Sister   . Diabetes Sister    Past Surgical History:  Procedure Laterality Date  . BREAST CYST EXCISION Right    "cut 6 cysts out"  . CYST EXCISION Left    "off my toe"  . DILATION AND CURETTAGE OF UTERUS     "before hysterectomy"  . INGUINAL HERNIA REPAIR Left   . JOINT REPLACEMENT    .  KNEE ARTHROSCOPY Bilateral   . REDUCTION MAMMAPLASTY Bilateral 1980  . TONSILLECTOMY     "when I small"  . TOTAL ABDOMINAL HYSTERECTOMY  1979  . TOTAL KNEE ARTHROPLASTY Right 04/26/2017  . TOTAL KNEE ARTHROPLASTY Left 04/26/2017   Procedure: LEFT TOTAL KNEE ARTHROPLASTY;  Surgeon: [name], MD;  Location: [facility];  Service: Orthopedics;  Laterality: Left;   Social History    Social History Narrative  . Not on file     Objective: Vital Signs: BP (!) 142/78 (BP Location: Left Arm, Patient Position: Sitting, Cuff Size: Large)   Pulse (!) 102   Resp 12   Wt 210 lb (95.3 kg)   BMI 34.95 kg/m    Physical Exam  Constitutional: She is oriented to person, place, and time. She appears well-developed and well-nourished.  HENT:  Head: Normocephalic and atraumatic.  Eyes: Conjunctivae and EOM are normal.  Neck: Normal range of motion.  Cardiovascular: Normal rate, regular rhythm, normal heart sounds and intact distal pulses.  Pulmonary/Chest: Effort normal and breath sounds normal.  Abdominal: Soft. Bowel sounds are normal.  Lymphadenopathy:    She has no cervical adenopathy.  Neurological: She is alert and oriented to person, place, and time.  Skin: Skin is warm and dry. Capillary refill takes less than 2 seconds.  Psychiatric: She has a normal mood and affect. Her behavior is normal.  Nursing note and vitals reviewed.    Musculoskeletal Exam: C-spine good range of motion. She has some discomfort range of motion of her lumbar spine. She is some tenderness in the lower lumbar region. Shoulder joints elbow joints wrist joint MCPs PIPs DIPs are good range of motion with no synovitis. Hip joints are good range of motion. Her right knee joint with some discomfort with range of motion without any warmth swelling or effusion. Left knee joint is replaced which is doing quite well with some warmth.  CDAI Exam: No CDAI exam completed.    Investigation: No additional findings. CBC Latest Ref Rng & Units 04/28/2017 04/27/2017 04/15/2017  WBC 4.0 - 10.5 K/uL 11.7(H) 9.6 7.6  Hemoglobin 12.0 - 15.0 g/dL 10.7(L) 10.2(L) 12.7  Hematocrit 36.0 - 46.0 % 32.1(L) 30.9(L) 38.3  Platelets 150 - 400 K/uL 240 241 312   CMP Latest Ref Rng & Units 04/28/2017 04/27/2017 04/15/2017  Glucose 65 - 99 mg/dL 120(H) 132(H) 112(H)  BUN 6 - 20 mg/dL 11 11 19   Creatinine 0.44 - 1.00 mg/dL  0.82 0.86 1.01(H)  Sodium 135 - 145 mmol/L 134(L) 134(L) 138  Potassium 3.5 - 5.1 mmol/L 3.7 3.3(L) 3.5  Chloride 101 - 111 mmol/L 103 103 105  CO2 22 - 32 mmol/L 22 25 23   Calcium 8.9 - 10.3 mg/dL 8.8(L) 8.8(L) 10.3  Total Protein 6.5 - 8.1 g/dL - - 7.8  Total Bilirubin 0.3 - 1.2 mg/dL - - 0.6  Alkaline Phos 38 - 126 U/L - - 98  AST 15 - 41 U/L - - 20  ALT 14 - 54 U/L - - 15    Imaging: Xr Lumbar Spine 2-3 Views  Result Date: 09/21/2017 Multilevel spondylosis with anterior spurring and facet joint arthropathy was noted. Impression: Degenerative disc disease and facet joint arthropathy of lumbar spine   Speciality Comments: No specialty comments available.    Procedures:  No procedures performed Allergies: Patient has no known allergies.   Assessment / Plan:     Visit Diagnoses: Chronic midline low back pain without sciatica -she's been experiencing increased lower back pain  since her epidural. She is in constant discomfort. I will obtain x-ray of her lumbar spine today. Plan: XR Lumbar Spine 2-3 Views. X-ray findings was discussed. Given her handout on lumbar spine exercises. Weight loss was also discussed.  DDD (degenerative disc disease), cervical: Chronic pain  Primary osteoarthritis of both hands - h/o +RF, elevated sed rate . Patient has no synovitis on examination  Primary osteoarthritis of right knee: She continues to have some discomfort and would like to have right total knee replacement next year.  S/P total knee replacement using cement, left - 04/26/2017. She is recovered very well with her left total knee replacement.: Doing well  Status post left foot surgery  Other chronic pain -patient states she is taking tramadol for pain management in the past which she uses only on when necessary basis. I would like a refill on the medication. She was getting some tramadol through Dr. Durward Fortes after surgery. A narcotic agreement was signed today. UDS was obtained. Plan:  Pain Mgmt, Profile 5 w/Conf, U, Pain Mgmt, Tramadol w/medMATCH,   History of hypertension  History of hyperlipidemia    Orders: Orders Placed This Encounter  Procedures  . XR Lumbar Spine 2-3 Views  . Pain Mgmt, Profile 5 w/Conf, U  . Pain Mgmt, Tramadol w/medMATCH, U   Meds ordered this encounter  Medications  . traMADol (ULTRAM) 50 MG tablet    Sig: Take 1 tab bid as needed    Dispense:  180 tablet    Refill:  0    Face-to-face time spent with patient was 30 minutes. Greater than 50% of time was spent in counseling and coordination of care.  Follow-Up Instructions: Return in about 6 months (around 03/22/2018) for Osteoarthritis, DDD.   Bo Merino, MD  Note - This record has been created using Editor, commissioning.  Chart creation errors have been sought, but may not always  have been located. Such creation errors do not reflect on  the standard of medical care.

## 2017-09-21 ENCOUNTER — Ambulatory Visit: Payer: PPO | Admitting: Rheumatology

## 2017-09-21 ENCOUNTER — Ambulatory Visit (INDEPENDENT_AMBULATORY_CARE_PROVIDER_SITE_OTHER): Payer: PPO

## 2017-09-21 ENCOUNTER — Encounter: Payer: Self-pay | Admitting: Rheumatology

## 2017-09-21 VITALS — BP 142/78 | HR 102 | Resp 12 | Wt 210.0 lb

## 2017-09-21 DIAGNOSIS — M5030 Other cervical disc degeneration, unspecified cervical region: Secondary | ICD-10-CM | POA: Diagnosis not present

## 2017-09-21 DIAGNOSIS — M545 Low back pain, unspecified: Secondary | ICD-10-CM

## 2017-09-21 DIAGNOSIS — Z8639 Personal history of other endocrine, nutritional and metabolic disease: Secondary | ICD-10-CM

## 2017-09-21 DIAGNOSIS — Z96652 Presence of left artificial knee joint: Secondary | ICD-10-CM | POA: Diagnosis not present

## 2017-09-21 DIAGNOSIS — G8929 Other chronic pain: Secondary | ICD-10-CM

## 2017-09-21 DIAGNOSIS — M19042 Primary osteoarthritis, left hand: Secondary | ICD-10-CM

## 2017-09-21 DIAGNOSIS — M1711 Unilateral primary osteoarthritis, right knee: Secondary | ICD-10-CM

## 2017-09-21 DIAGNOSIS — Z8679 Personal history of other diseases of the circulatory system: Secondary | ICD-10-CM | POA: Diagnosis not present

## 2017-09-21 DIAGNOSIS — M19041 Primary osteoarthritis, right hand: Secondary | ICD-10-CM | POA: Diagnosis not present

## 2017-09-21 DIAGNOSIS — Z9889 Other specified postprocedural states: Secondary | ICD-10-CM

## 2017-09-21 MED ORDER — TRAMADOL HCL 50 MG PO TABS: ORAL_TABLET | ORAL | 0 refills | Status: DC

## 2017-09-21 NOTE — Patient Instructions (Signed)

## 2017-09-23 NOTE — Progress Notes (Signed)
Consistent with the treatment.

## 2017-09-26 LAB — PAIN MGMT, PROFILE 5 W/CONF, U
AMPHETAMINES: NEGATIVE ng/mL (ref <500)
BENZODIAZEPINES: NEGATIVE ng/mL (ref <100)
Barbiturates: NEGATIVE ng/mL (ref <300)
COCAINE METABOLITE: NEGATIVE ng/mL (ref <150)
CREATININE: 108.9 mg/dL
Marijuana Metabolite: NEGATIVE ng/mL (ref <20)
Methadone Metabolite: NEGATIVE ng/mL (ref <100)
OPIATES: NEGATIVE ng/mL (ref <100)
Oxidant: NEGATIVE ug/mL (ref <200)
Oxycodone: NEGATIVE ng/mL (ref <100)
pH: 6.18 (ref 4.5–9.0)

## 2017-09-26 LAB — PAIN MGMT, TRAMADOL W/MEDMATCH, U
Desmethyltramadol: NEGATIVE ng/mL (ref <100)
Tramadol: NEGATIVE ng/mL (ref <100)

## 2017-10-18 ENCOUNTER — Emergency Department (HOSPITAL_COMMUNITY): Payer: PPO

## 2017-10-18 ENCOUNTER — Encounter (HOSPITAL_COMMUNITY): Payer: Self-pay | Admitting: Emergency Medicine

## 2017-10-18 ENCOUNTER — Emergency Department (HOSPITAL_COMMUNITY)
Admission: EM | Admit: 2017-10-18 | Discharge: 2017-10-18 | Disposition: A | Discharge status: Home / Self Care | Payer: PPO | Attending: Emergency Medicine | Admitting: Emergency Medicine

## 2017-10-18 DIAGNOSIS — Z79899 Other long term (current) drug therapy: Secondary | ICD-10-CM | POA: Insufficient documentation

## 2017-10-18 DIAGNOSIS — I4891 Unspecified atrial fibrillation: Secondary | ICD-10-CM | POA: Diagnosis not present

## 2017-10-18 DIAGNOSIS — N179 Acute kidney failure, unspecified: Secondary | ICD-10-CM | POA: Insufficient documentation

## 2017-10-18 DIAGNOSIS — I10 Essential (primary) hypertension: Secondary | ICD-10-CM | POA: Insufficient documentation

## 2017-10-18 DIAGNOSIS — R110 Nausea: Secondary | ICD-10-CM | POA: Diagnosis not present

## 2017-10-18 DIAGNOSIS — Z7982 Long term (current) use of aspirin: Secondary | ICD-10-CM | POA: Insufficient documentation

## 2017-10-18 DIAGNOSIS — R079 Chest pain, unspecified: Secondary | ICD-10-CM | POA: Diagnosis not present

## 2017-10-18 DIAGNOSIS — R0789 Other chest pain: Secondary | ICD-10-CM | POA: Diagnosis not present

## 2017-10-18 LAB — CBC
HEMATOCRIT: 38.7 % (ref 36.0–46.0)
HEMOGLOBIN: 12.9 g/dL (ref 12.0–15.0)
MCH: 29.3 pg (ref 26.0–34.0)
MCHC: 33.3 g/dL (ref 30.0–36.0)
MCV: 88 fL (ref 78.0–100.0)
Platelets: 361 10*3/uL (ref 150–400)
RBC: 4.4 MIL/uL (ref 3.87–5.11)
RDW: 13.7 % (ref 11.5–15.5)
WBC: 6.2 10*3/uL (ref 4.0–10.5)

## 2017-10-18 LAB — BASIC METABOLIC PANEL
Anion gap: 11 (ref 5–15)
BUN: 32 mg/dL — AB (ref 6–20)
CHLORIDE: 107 mmol/L (ref 101–111)
CO2: 21 mmol/L — ABNORMAL LOW (ref 22–32)
Calcium: 9 mg/dL (ref 8.9–10.3)
Creatinine, Ser: 1.76 mg/dL — ABNORMAL HIGH (ref 0.44–1.00)
GFR calc Af Amer: 31 mL/min — ABNORMAL LOW (ref >60)
GFR, EST NON AFRICAN AMERICAN: 26 mL/min — AB (ref >60)
GLUCOSE: 91 mg/dL (ref 65–99)
POTASSIUM: 3.5 mmol/L (ref 3.5–5.1)
Sodium: 139 mmol/L (ref 135–145)

## 2017-10-18 LAB — D-DIMER, QUANTITATIVE (NOT AT ARMC): D DIMER QUANT: 1.4 ug{FEU}/mL — AB (ref 0.00–0.50)

## 2017-10-18 LAB — I-STAT TROPONIN, ED: Troponin i, poc: 0.01 ng/mL (ref 0.00–0.08)

## 2017-10-18 LAB — MAGNESIUM: Magnesium: 1.3 mg/dL — ABNORMAL LOW (ref 1.7–2.4)

## 2017-10-18 MED ORDER — IOPAMIDOL (ISOVUE-370) INJECTION 76%
INTRAVENOUS | Status: AC
  Filled 2017-10-18: qty 100

## 2017-10-18 MED ORDER — RIVAROXABAN 15 MG PO TABS
15.0000 mg | ORAL_TABLET | Freq: Every day | ORAL | Status: DC
  Administered 2017-10-18: 15 mg via ORAL
  Filled 2017-10-18: qty 1

## 2017-10-18 MED ORDER — IOPAMIDOL (ISOVUE-370) INJECTION 76%
100.0000 mL | Freq: Once | INTRAVENOUS | Status: AC | PRN
  Administered 2017-10-18: 65 mL via INTRAVENOUS

## 2017-10-18 MED ORDER — RIVAROXABAN 15 MG PO TABS
15.0000 mg | ORAL_TABLET | Freq: Every day | ORAL | Status: DC
  Filled 2017-10-18: qty 1

## 2017-10-18 MED ORDER — MAGNESIUM SULFATE 2 GM/50ML IV SOLN
2.0000 g | Freq: Once | INTRAVENOUS | Status: AC
  Administered 2017-10-18: 2 g via INTRAVENOUS
  Filled 2017-10-18: qty 50

## 2017-10-18 MED ORDER — DILTIAZEM HCL ER COATED BEADS 180 MG PO CP24
180.0000 mg | ORAL_CAPSULE | Freq: Once | ORAL | Status: AC
  Administered 2017-10-18: 180 mg via ORAL
  Filled 2017-10-18: qty 1

## 2017-10-18 MED ORDER — SODIUM CHLORIDE 0.9 % IJ SOLN
INTRAMUSCULAR | Status: AC
  Filled 2017-10-18: qty 50

## 2017-10-18 MED ORDER — RIVAROXABAN 15 MG PO TABS: 15.0000 mg | ORAL_TABLET | Freq: Every day | ORAL | 0 refills | Status: DC

## 2017-10-18 MED ORDER — DILTIAZEM HCL ER COATED BEADS 180 MG PO CP24: 180.0000 mg | ORAL_CAPSULE | Freq: Every day | ORAL | 0 refills | Status: DC

## 2017-10-18 MED ORDER — SODIUM CHLORIDE 0.9 % IV BOLUS (SEPSIS)
2000.0000 mL | Freq: Once | INTRAVENOUS | Status: AC
  Administered 2017-10-18: 2000 mL via INTRAVENOUS

## 2017-10-18 NOTE — Discharge Instructions (Addendum)
You are noted to be in A. fib.  Please follow-up with the A. fib clinic as soon as possible.  Call the number provided if you do not hear from the A. fib clinic by tomorrow afternoon.  Your kidney enzymes also slightly bumped.  Make sure you hydrate yourself well.  Please see your primary care doctor in 5 days, and have your kidney function checked again.  Please read the instructions on the medications that you have been prescribed.  Information on my medicine - XARELTO (Rivaroxaban)   WHY WAS XARELTO PRESCRIBED FOR YOU? Xarelto was prescribed for you to reduce the risk of a blood clot forming that can cause a stroke if you have a medical condition called atrial fibrillation (a type of irregular heartbeat).  WHAT DO YOU NEED TO KNOW ABOUT XARELTO ? Take your Xarelto ONCE DAILY at the same time every day with your evening meal. If you have difficulty swallowing the tablet whole, you may crush it and mix in applesauce just prior to taking your dose.  Take Xarelto exactly as prescribed by your doctor and DO NOT stop taking Xarelto without talking to the doctor who prescribed the medication.  Stopping without other stroke prevention medication to take the place of Xarelto may increase your risk of developing a clot that causes a stroke.  Refill your prescription before you run out.  After discharge, you should have regular check-up appointments with your healthcare provider that is prescribing your Xarelto.  In the future your dose may need to be changed if your kidney function or weight changes by a significant amount.  WHAT DO YOU DO IF YOU MISS A DOSE? If you are taking Xarelto ONCE DAILY and you miss a dose, take it as soon as you remember on the same day then continue your regularly scheduled once daily regimen the next day. Do not take two doses of Xarelto at the same time or on the same day.   IMPORTANT SAFETY INFORMATION A possible side effect of Xarelto is bleeding. You  should call your healthcare provider right away if you experience any of the following: Bleeding from an injury or your nose that does not stop. Unusual colored urine (red or dark brown) or unusual colored stools (red or black). Unusual bruising for unknown reasons. A serious fall or if you hit your head (even if there is no bleeding).  Some medicines may interact with Xarelto and might increase your risk of bleeding while on Xarelto. To help avoid this, consult your healthcare provider or pharmacist prior to using any new prescription or non-prescription medications, including herbals, vitamins, non-steroidal anti-inflammatory drugs (NSAIDs) and supplements.  This website has more information on Xarelto: [email or web address]

## 2017-10-18 NOTE — ED Notes (Signed)
Pt given gingerale for PO challenge 

## 2017-10-18 NOTE — ED Triage Notes (Signed)
Patient c/o loss of appetite, intermittent vomiting and chest tightness over the past 2 weeks.

## 2017-10-18 NOTE — Progress Notes (Signed)
ANTICOAGULATION CONSULT NOTE -   Pharmacy Consult for xarelto Indication: atrial fibrillation  No Known Allergies  Patient Measurements: Height: 5\' 7"  (170.2 cm) Weight: 210 lb (95.3 kg) IBW/kg (Calculated) : 61.6   Vital Signs: Temp: 97.8 F (36.6 C) (01/08 1532) Temp Source: Oral (01/08 1532) BP: 147/76 (01/08 1532) Pulse Rate: 77 (01/08 1532)  Labs: Recent Labs    10/18/17 1147  HGB 12.9  HCT 38.7  PLT 361  CREATININE 1.76*    Estimated Creatinine Clearance: 30.7 mL/min (A) (by C-G formula based on SCr of 1.76 mg/dL (H)).    Assessment: 82 yo female who presents with loss of appetite intermittent vomiting and chest tightness over the past 2 weeks.  Pharmacy consulted to dose xarelto for nonvalvular atrial fibrillation.  Scr 1.76, CrCl ~ 30.45mls/min Plts WNL H/H WNL  Goal of Therapy:  Monitor platelets by anticoagulation protocol   Plan:  xarelto 15mg  po once daily with food. Follow renal function  Pharmacy to provide education  Dolly Rias RPh 10/18/2017, 4:02 PM Pager [phone]

## 2017-10-18 NOTE — ED Notes (Signed)
E-sign not working.

## 2017-10-18 NOTE — ED Notes (Signed)
Nevin Bloodgood, patient's daughter, [phone] can be reached with updates and if need any information.

## 2017-10-18 NOTE — ED Notes (Signed)
Bed: [facility]1 Expected date:  Expected time:  Means of arrival:  Comments: 

## 2017-10-18 NOTE — ED Provider Notes (Addendum)
[facility] DEPT Provider Note   CSN: [id number] Arrival date & time: 10/18/17  0847     History   Chief Complaint Chief Complaint  Patient presents with  . Chest Pain  . Emesis    HPI Lauren Richard is a 82 y.o. female.  HPI  82 year old female with history of high hyperlipidemia comes in with chief complaint of weakness, chest discomfort.  Patient reports that for the past 10 days she has been having anorexia, weakness.  Patient also has had about couple of episodes of nausea with emesis.  Patient this morning had chest tightness with mild shortness of breath.  Patient denies any palpitations, dizziness, wheezing.  Patient has had a nonproductive cough, but denies any other flulike illness.  Patient is noted to have irregularly irregular heart rate.  She reports that she had knee replacement done within the last 2 months.  Patient has no known cardiac disease, but does have family history of both blood clots and A. Fib.  Past Medical History:  Diagnosis Date  . Arthritis    "all over" (04/26/2017)  . Essential hypertension 03/03/2017  . History of blood transfusion 1979   "when I had my hysterectomy"  . Hyperlipidemia   . Obesity 03/03/2017  . Osteoarthritis 03/03/2017    Patient Active Problem List   Diagnosis Date Noted  . Osteoarthritis of left knee 04/26/2017  . S/P total knee replacement using cement, left 04/26/2017  . Essential hypertension 03/03/2017  . Hyperlipidemia 03/03/2017  . Osteoarthritis 03/03/2017  . Obesity 03/03/2017  . Status post left foot surgery 12/03/2015  . Hammertoe 11/03/2015    Past Surgical History:  Procedure Laterality Date  . BREAST CYST EXCISION Right    "cut 6 cysts out"  . CYST EXCISION Left    "off my toe"  . DILATION AND CURETTAGE OF UTERUS     "before hysterectomy"  . INGUINAL HERNIA REPAIR Left   . JOINT REPLACEMENT    . KNEE ARTHROSCOPY Bilateral   . REDUCTION MAMMAPLASTY Bilateral 1980    . TONSILLECTOMY     "when I small"  . TOTAL ABDOMINAL HYSTERECTOMY  1979  . TOTAL KNEE ARTHROPLASTY Right 04/26/2017  . TOTAL KNEE ARTHROPLASTY Left 04/26/2017   Procedure: LEFT TOTAL KNEE ARTHROPLASTY;  Surgeon: [name], MD;  Location: [facility];  Service: Orthopedics;  Laterality: Left;    OB History    No data available       Home Medications    Prior to Admission medications   Medication Sig Start Date End Date Taking? Authorizing Provider  acetaminophen (TYLENOL) 500 MG tablet Take 1,000 mg by mouth daily as needed.   Yes [provider]  amLODipine (NORVASC) 5 MG tablet Take 5 mg by mouth daily.  08/12/16  Yes [provider]  aspirin 81 MG tablet Take 81 mg by mouth daily.   Yes [provider]  atorvastatin (LIPITOR) 20 MG tablet Take 20 mg by mouth daily.   Yes [provider]  Cholecalciferol (VITAMIN D) 2000 units tablet Take 2,000 Units by mouth daily.   Yes [provider]  lidocaine-prilocaine (EMLA) cream Apply 1 application topically as needed (BACK PAIN).   Yes [provider]  losartan-hydrochlorothiazide (HYZAAR) 100-25 MG tablet Take 1 tablet by mouth daily.  08/12/16  Yes [provider]  Multiple Vitamins-Minerals (ONE-A-DAY WOMENS 50+ ADVANTAGE PO) Take 1 tablet by mouth daily.   Yes [provider]  potassium chloride (K-DUR) 10 MEQ  tablet Take 10 mEq by mouth daily.  10/13/16  Yes [provider]  traMADol (ULTRAM) 50 MG tablet Take 1 tablet (50 mg total) every 12 (twelve) hours as needed by mouth. 08/22/17  Yes [name], MD  traMADol Veatrice Bourbon) 50 MG tablet Take 1 tab bid as needed 09/21/17  Yes Deveshwar, Abel Presto, MD  diltiazem (CARDIZEM CD) 180 MG 24 hr capsule Take 1 capsule (180 mg total) by mouth daily. 10/18/17   Varney Biles, MD  methocarbamol (ROBAXIN) 500 MG tablet Take 1 tablet (500 mg total) by mouth every 8 (eight) hours as needed for muscle spasms. Patient  not taking: Reported on 08/22/2017 06/17/17   Cherylann Ratel, PA-C  PRESCRIPTION MEDICATION CREAM FOR BACK PAIN    [provider]  Rivaroxaban (XARELTO) 15 MG TABS tablet Take 1 tablet (15 mg total) by mouth daily with supper. 10/18/17   Varney Biles, MD    Family History Family History  Problem Relation Age of Onset  . Kidney cancer Mother   . Lung cancer Father   . Congestive Heart Failure Sister   . Asthma Sister   . Atrial fibrillation Sister   . Hypertension Sister   . Diabetes Sister     Social History Social History   Tobacco Use  . Smoking status: Never Smoker  . Smokeless tobacco: Never Used  Substance Use Topics  . Alcohol use: Yes    Alcohol/week: 0.0 oz    Comment: 04/26/2017 "might have 1 drink/month; if that"  . Drug use: No     Allergies   Patient has no known allergies.   Review of Systems Review of Systems  Constitutional: Positive for activity change.  Cardiovascular: Positive for chest pain.  Gastrointestinal: Positive for nausea.  Hematological: Does not bruise/bleed easily.  All other systems reviewed and are negative.    Physical Exam Updated Vital Signs BP 111/83   Pulse 88   Temp 97.8 F (36.6 C) (Oral)   Resp (!) 22   Ht 5\' 7"  (1.702 m)   Wt 95.3 kg (210 lb)   SpO2 99%   BMI 32.89 kg/m   Physical Exam  Constitutional: She is oriented to person, place, and time. She appears well-developed.  HENT:  Head: Normocephalic and atraumatic.  Eyes: EOM are normal.  Neck: Normal range of motion. Neck supple.  Cardiovascular: Normal rate. An irregularly irregular rhythm present.  Pulmonary/Chest: Effort normal.  Abdominal: Bowel sounds are normal.  Musculoskeletal:       Right lower leg: She exhibits no tenderness and no edema.       Left lower leg: She exhibits no tenderness and no edema.  Neurological: She is alert and oriented to person, place, and time.  Skin: Skin is warm and dry.  Nursing note and vitals  reviewed.    ED Treatments / Results  Labs (all labs ordered are listed, but only abnormal results are displayed) Labs Reviewed  BASIC METABOLIC PANEL - Abnormal; Notable for the following components:      Result Value   CO2 21 (*)    BUN 32 (*)    Creatinine, Ser 1.76 (*)    GFR calc non Af Amer 26 (*)    GFR calc Af Amer 31 (*)    All other components within normal limits  D-DIMER, QUANTITATIVE (NOT AT [facility]) - Abnormal; Notable for the following components:   D-Dimer, Quant 1.40 (*)    All other components within normal limits  MAGNESIUM - Abnormal; Notable  for the following components:   Magnesium 1.3 (*)    All other components within normal limits  CBC  I-STAT TROPONIN, ED    EKG  EKG Interpretation  Date/Time:  Tuesday October 18 2017 09:13:38 EST Ventricular Rate:  117 PR Interval:  164 QRS Duration: 84 QT Interval:  322 QTC Calculation: 449 R Axis:   45 Text Interpretation:  Atrial fibrillation Otherwise normal ECG Since last tracing rate faster Reconfirmed by Varney Biles [id number]) on 10/18/2017 4:26:45 PM       Radiology Dg Chest 2 View  Result Date: 10/18/2017 CLINICAL DATA:  Chest pain. EXAM: CHEST  2 VIEW COMPARISON:  04/15/2017. FINDINGS: Mediastinum and hilar structures normal. Heart size normal. Low lung volumes with mild basilar atelectasis. No pleural effusion or pneumothorax. Degenerative changes thoracic spine. IMPRESSION: No acute cardiopulmonary disease. Electronically Signed   By: Marcello Moores  Register   On: 10/18/2017 09:19   Ct Angio Chest Pe W And/or Wo Contrast  Result Date: 10/18/2017 CLINICAL DATA:  Chest pain for 10 days.  Anorexia and weakness. EXAM: CT ANGIOGRAPHY CHEST WITH CONTRAST TECHNIQUE: Multidetector CT imaging of the chest was performed using the standard protocol during bolus administration of intravenous contrast. Multiplanar CT image reconstructions and MIPs were obtained to evaluate the vascular anatomy. CONTRAST:  70mL ISOVUE-370  IOPAMIDOL (ISOVUE-370) INJECTION 76% COMPARISON:  None. FINDINGS: Cardiovascular: The heart is borderline enlarged for age. No pericardial effusion. There is mild tortuosity, ectasia and calcification of the thoracic aorta. No dissection. Branch vessels are patent. Atherosclerotic calcifications noted. Scattered coronary artery calcifications are noted. The pulmonary arterial tree is fairly well opacified. No definite filling defects to suggest pulmonary embolism. Mediastinum/Nodes: No mediastinal or hilar mass or adenopathy. The esophagus is grossly normal. Lungs/Pleura: No infiltrates, edema or effusions. Basilar scarring changes and streaky atelectasis. No worrisome pulmonary lesions. No pleural effusion. Upper Abdomen: No significant upper abdominal findings. Musculoskeletal: No breast masses, supraclavicular or axillary adenopathy. Thyroid goiter noted. The bony structures are intact. Review of the MIP images confirms the above findings. IMPRESSION: 1. No CT findings for pulmonary embolism. 2. Mild tortuosity, ectasia and calcification of the thoracic aorta but no dissection. 3. No acute pulmonary findings. Aortic Atherosclerosis (ICD10-I70.0). Electronically Signed   By: Marijo Sanes M.D.   On: 10/18/2017 16:45    Procedures Procedures (including critical care time)  Medications Ordered in ED Medications  magnesium sulfate IVPB 2 g 50 mL (not administered)  diltiazem (CARDIZEM CD) 24 hr capsule 180 mg (not administered)  sodium chloride 0.9 % injection (not administered)  iopamidol (ISOVUE-370) 76 % injection (not administered)  sodium chloride 0.9 % bolus 2,000 mL (0 mLs Intravenous Stopped 10/18/17 1547)  iopamidol (ISOVUE-370) 76 % injection 100 mL (65 mLs Intravenous Contrast Given 10/18/17 1621)     Initial Impression / Assessment and Plan / ED Course  I have reviewed the triage vital signs and the nursing notes.  Pertinent labs & imaging results that were available during my care of the  patient were reviewed by me and considered in my medical decision making (see chart for details).  Clinical Course as of Oct 18 1656  Tue Oct 18, 2017  1631 I spoke with Dr. Rayann Heman about patient being in new onset A. fib.  Patient is not a cardioversion candidate given the onset is unclear.  Dr. Rayann Heman recommends that patient be started on anticoagulation, and diltiazem 180 mg.  They will see the patient in the A. fib clinic.  [AN]  7035 D-dimer  is elevated, CT PE ordered.  Risk and benefit of contrast in the setting of acute kidney injury discussed with patient.  Patient has agreed to go forward with reduced contrast CT PE rather than VQ scan.  Given that patient does not have any known renal failure.  Patient has been well hydrated in the ER, patient does not have any other cardiac or liver problems, I think a CT PE might be the better test.  Patient will be requiring a recheck of her electrolytes by her PCP or cardiologist.  Fortunately, patient is not on lisinopril or any other nephrotoxic medication. D-Dimer, Quant: (!) 1.40 [AN]  1658 Results from the ER workup discussed with the patient face to face and all questions answered to the best of my ability.   [AN]    Clinical Course User Index [AN] Varney Biles, MD    Patient comes in with chief complaint of chest tightness.  Chest tightness started earlier last night, and is atypical.  Patient has no history of CAD.  Pain does not appear to be angina equivalent, EKG is not showing any ischemic findings, however patient is noted to be in A. fib.  At arrival patient was in A. fib with RVR, however now she is rate controlled.  Patient has history of recent knee replacement surgery, therefore PE could be the etiology.  Patient does not have any known structural heart disease.  A. fib workup will be initiated right now, PE workup will be also initiated.  This patients CHA2DS2-VASc Score and unadjusted Ischemic Stroke Rate (% per year) is equal to  4.8 % stroke rate/year from a score of 4  Above score calculated as 1 point each if present [CHF, HTN, DM, Vascular=MI/PAD/Aortic Plaque, Age if 65-74, or Female] Above score calculated as 2 points each if present [Age > 75, or Stroke/TIA/TE]    Labs indicate that patient has AK I.  Patient reports that she has had anorexia and reduced p.o. intake over the last few days, which I think is contributing along with possibly if with RVR.  Patient will receive 2 L of IV fluid right now.  Patient has already passed oral challenge, and we will have her PCP or the cardiologist recheck her electrolytes.   Final Clinical Impressions(s) / ED Diagnoses   Final diagnoses:  New onset a-fib ([place])  AKI (acute kidney injury) [facility])    ED Discharge Orders        Ordered    Amb referral to AFIB Clinic     10/18/17 1351    Rivaroxaban (XARELTO) 15 MG TABS tablet  Daily with supper     10/18/17 1652    diltiazem (CARDIZEM CD) 180 MG 24 hr capsule  Daily     10/18/17 1652       Varney Biles, MD 10/18/17 [place], Tykeshia Tourangeau, MD 10/18/17 1658

## 2017-10-24 ENCOUNTER — Ambulatory Visit (HOSPITAL_COMMUNITY)
Admission: RE | Admit: 2017-10-24 | Discharge: 2017-10-24 | Disposition: A | Discharge status: Home / Self Care | Payer: PPO | Source: Ambulatory Visit | Attending: Nurse Practitioner | Admitting: Nurse Practitioner

## 2017-10-24 ENCOUNTER — Encounter (HOSPITAL_COMMUNITY): Payer: Self-pay | Admitting: Nurse Practitioner

## 2017-10-24 VITALS — BP 102/58 | HR 124 | Ht 67.0 in | Wt 195.4 lb

## 2017-10-24 DIAGNOSIS — R634 Abnormal weight loss: Secondary | ICD-10-CM | POA: Insufficient documentation

## 2017-10-24 DIAGNOSIS — Z8051 Family history of malignant neoplasm of kidney: Secondary | ICD-10-CM | POA: Insufficient documentation

## 2017-10-24 DIAGNOSIS — Z6830 Body mass index (BMI) 30.0-30.9, adult: Secondary | ICD-10-CM | POA: Insufficient documentation

## 2017-10-24 DIAGNOSIS — I471 Supraventricular tachycardia: Secondary | ICD-10-CM

## 2017-10-24 DIAGNOSIS — Z801 Family history of malignant neoplasm of trachea, bronchus and lung: Secondary | ICD-10-CM | POA: Insufficient documentation

## 2017-10-24 DIAGNOSIS — Z825 Family history of asthma and other chronic lower respiratory diseases: Secondary | ICD-10-CM | POA: Insufficient documentation

## 2017-10-24 DIAGNOSIS — I4891 Unspecified atrial fibrillation: Secondary | ICD-10-CM | POA: Diagnosis present

## 2017-10-24 DIAGNOSIS — I10 Essential (primary) hypertension: Secondary | ICD-10-CM | POA: Diagnosis not present

## 2017-10-24 DIAGNOSIS — E785 Hyperlipidemia, unspecified: Secondary | ICD-10-CM | POA: Diagnosis not present

## 2017-10-24 DIAGNOSIS — Z7982 Long term (current) use of aspirin: Secondary | ICD-10-CM | POA: Diagnosis not present

## 2017-10-24 DIAGNOSIS — Z90710 Acquired absence of both cervix and uterus: Secondary | ICD-10-CM | POA: Insufficient documentation

## 2017-10-24 DIAGNOSIS — I480 Paroxysmal atrial fibrillation: Secondary | ICD-10-CM | POA: Diagnosis not present

## 2017-10-24 DIAGNOSIS — Z79899 Other long term (current) drug therapy: Secondary | ICD-10-CM | POA: Insufficient documentation

## 2017-10-24 DIAGNOSIS — Z833 Family history of diabetes mellitus: Secondary | ICD-10-CM | POA: Insufficient documentation

## 2017-10-24 DIAGNOSIS — Z8249 Family history of ischemic heart disease and other diseases of the circulatory system: Secondary | ICD-10-CM | POA: Diagnosis not present

## 2017-10-24 DIAGNOSIS — R630 Anorexia: Secondary | ICD-10-CM | POA: Insufficient documentation

## 2017-10-24 DIAGNOSIS — Z96653 Presence of artificial knee joint, bilateral: Secondary | ICD-10-CM | POA: Insufficient documentation

## 2017-10-24 DIAGNOSIS — M1990 Unspecified osteoarthritis, unspecified site: Secondary | ICD-10-CM | POA: Insufficient documentation

## 2017-10-24 DIAGNOSIS — R000 Tachycardia, unspecified: Secondary | ICD-10-CM | POA: Insufficient documentation

## 2017-10-24 LAB — BASIC METABOLIC PANEL
Anion gap: 12 (ref 5–15)
BUN: 23 mg/dL — AB (ref 6–20)
CALCIUM: 9.3 mg/dL (ref 8.9–10.3)
CO2: 19 mmol/L — ABNORMAL LOW (ref 22–32)
CREATININE: 1.44 mg/dL — AB (ref 0.44–1.00)
Chloride: 107 mmol/L (ref 101–111)
GFR calc non Af Amer: 34 mL/min — ABNORMAL LOW (ref >60)
GFR, EST AFRICAN AMERICAN: 39 mL/min — AB (ref >60)
Glucose, Bld: 126 mg/dL — ABNORMAL HIGH (ref 65–99)
Potassium: 3.4 mmol/L — ABNORMAL LOW (ref 3.5–5.1)
SODIUM: 138 mmol/L (ref 135–145)

## 2017-10-24 LAB — T4, FREE: FREE T4: 1.73 ng/dL — AB (ref 0.61–1.12)

## 2017-10-24 LAB — TSH

## 2017-10-24 NOTE — Progress Notes (Signed)
Primary Care Physician: Glendale Chard, MD Referring Physician: [facility] ER f/u   Lauren Richard is a 82 y.o. female with a h/o Htn, that presented to the ER 10/18/17 for not feeling well, no energy and vomiting 2 x in one day. EKG was done and pt was told she had afib and started on diltaizem and xarelto. She states that she was told she had afib during surgery with her knee repalcement in JUllu 2018 and started on xarelto but when she got home, she was called and told not to take the anticoagialtion any longer as she did not have afib.  In the afib clinic today as f/u of ER visit, she is in Sinus tach at 124 bpm. Review of her EKG's and review with Dr. Rayann Heman today, show that EKG's from the ER, are SR with 2-3 PAC's at a time and baseline rhythm is irregular. But with careful review, she has P waves through out the EKG's. All 3 in the ER are Sinus tach. She states that she has not felt well for the last several weeks, no appetite , nausea and by [employer], she has lost 15 lbs since mid December.With  blood draw today, pt had vasovagal response and briefly had LOC. She quickly had return of BP to normal with lying pt flat and giving water to drink.  Today, she denies symptoms of palpitations, chest pain, shortness of breath, orthopnea, PND, lower extremity edema, dizziness, presyncope, syncope, or neurologic sequela. The patient is tolerating medications without difficulties and is otherwise without complaint today.   Past Medical History:  Diagnosis Date  . Arthritis    "all over" (04/26/2017)  . Essential hypertension 03/03/2017  . History of blood transfusion 1979   "when I had my hysterectomy"  . Hyperlipidemia   . Obesity 03/03/2017  . Osteoarthritis 03/03/2017   Past Surgical History:  Procedure Laterality Date  . BREAST CYST EXCISION Right    "cut 6 cysts out"  . CYST EXCISION Left    "off my toe"  . DILATION AND CURETTAGE OF UTERUS     "before hysterectomy"  . INGUINAL HERNIA  REPAIR Left   . JOINT REPLACEMENT    . KNEE ARTHROSCOPY Bilateral   . REDUCTION MAMMAPLASTY Bilateral 1980  . TONSILLECTOMY     "when I small"  . TOTAL ABDOMINAL HYSTERECTOMY  1979  . TOTAL KNEE ARTHROPLASTY Right 04/26/2017  . TOTAL KNEE ARTHROPLASTY Left 04/26/2017   Procedure: LEFT TOTAL KNEE ARTHROPLASTY;  Surgeon: [name], MD;  Location: [facility];  Service: Orthopedics;  Laterality: Left;    Current Outpatient Medications  Medication Sig Dispense Refill  . acetaminophen (TYLENOL) 500 MG tablet Take 1,000 mg by mouth daily as needed.    Marland Kitchen amLODipine (NORVASC) 5 MG tablet Take 5 mg by mouth daily.     Marland Kitchen aspirin 81 MG tablet Take 81 mg by mouth daily.    Marland Kitchen atorvastatin (LIPITOR) 20 MG tablet Take 20 mg by mouth daily.    . Cholecalciferol (VITAMIN D) 2000 units tablet Take 2,000 Units by mouth daily.    Marland Kitchen diltiazem (CARDIZEM CD) 180 MG 24 hr capsule Take 1 capsule (180 mg total) by mouth daily. 10 capsule 0  . lidocaine-prilocaine (EMLA) cream Apply 1 application topically as needed (BACK PAIN).    Marland Kitchen losartan-hydrochlorothiazide (HYZAAR) 100-25 MG tablet Take 1 tablet by mouth daily.     . methocarbamol (ROBAXIN) 500 MG tablet Take 1 tablet (500 mg total) by mouth  every 8 (eight) hours as needed for muscle spasms. 30 tablet 0  . Multiple Vitamins-Minerals (ONE-A-DAY WOMENS 50+ ADVANTAGE PO) Take 1 tablet by mouth daily.    . potassium chloride (K-DUR) 10 MEQ tablet Take 10 mEq by mouth daily.     Marland Kitchen PRESCRIPTION MEDICATION CREAM FOR BACK PAIN    . Rivaroxaban (XARELTO) 15 MG TABS tablet Take 1 tablet (15 mg total) by mouth daily with supper. 42 tablet 0  . traMADol (ULTRAM) 50 MG tablet Take 1 tablet (50 mg total) every 12 (twelve) hours as needed by mouth. 30 tablet 0   No current facility-administered medications for this encounter.     No Known Allergies  Social History   Socioeconomic History  . Marital status: Widowed    Spouse name: Not on file  . Number of  children: Not on file  . Years of education: Not on file  . Highest education level: Not on file  Social Needs  . Financial resource strain: Not on file  . Food insecurity - worry: Not on file  . Food insecurity - inability: Not on file  . Transportation needs - medical: Not on file  . Transportation needs - non-medical: Not on file  Occupational History  . Not on file  Tobacco Use  . Smoking status: Never Smoker  . Smokeless tobacco: Never Used  Substance and Sexual Activity  . Alcohol use: Yes    Alcohol/week: 0.0 oz    Comment: 04/26/2017 "might have 1 drink/month; if that"  . Drug use: No  . Sexual activity: No  Other Topics Concern  . Not on file  Social History Narrative  . Not on file    Family History  Problem Relation Age of Onset  . Kidney cancer Mother   . Lung cancer Father   . Congestive Heart Failure Sister   . Asthma Sister   . Atrial fibrillation Sister   . Hypertension Sister   . Diabetes Sister     ROS- All systems are reviewed and negative except as per the HPI above  Physical Exam: Vitals:   10/24/17 0854  BP: (!) 102/58  Pulse: (!) 124  Weight: 195 lb 6.4 oz (88.6 kg)  Height: 5\' 7"  (1.702 m)   Wt Readings from Last 3 Encounters:  10/24/17 195 lb 6.4 oz (88.6 kg)  10/18/17 210 lb (95.3 kg)  09/21/17 210 lb (95.3 kg)    Labs: Lab Results  Component Value Date   NA 138 10/24/2017   K 3.4 (L) 10/24/2017   CL 107 10/24/2017   CO2 19 (L) 10/24/2017   GLUCOSE 126 (H) 10/24/2017   BUN 23 (H) 10/24/2017   CREATININE 1.44 (H) 10/24/2017   CALCIUM 9.3 10/24/2017   MG 1.3 (L) 10/18/2017   Lab Results  Component Value Date   INR 1.03 04/15/2017   No results found for: CHOL, HDL, LDLCALC, TRIG   GEN- The patient is well appearing, alert and oriented x 3 today.   Head- normocephalic, atraumatic Eyes-  Sclera clear, conjunctiva pink Ears- hearing intact Oropharynx- clear Neck- supple, no JVP Lymph- no cervical lymphadenopathy Lungs-  Clear to ausculation bilaterally, normal work of breathing Heart- rapid  regular rate and rhythm, no murmurs, rubs or gallops, PMI not laterally displaced GI- soft, NT, ND, + BS Extremities- no clubbing, cyanosis, or edema MS- no significant deformity or atrophy Skin- no rash or lesion Psych- euthymic mood, full affect Neuro- strength and sensation are intact  EKG- Sinus tach at  124 bpm, pr int 152 ms, qrs int 84 ms, qtc 474 ms Epic records reviewed    Assessment and Plan: 1. Rapid heart beat/ dx in ER as afib EKG's reviewed do not show afib but sinus tach with PAC's Reviewed with Dr. Rayann Heman and he did agree not afib and pt can stop xarelto She will continue cardizem for tachycardia Echo pending   2. Tachycardia/weight loss/ anorexia Thyroid panel today shows TSH less than 0.010 May explain recent symptoms/tachycardia and weight loss Labs sent to PCP, pt informed and will call PCP for f/u of abnormal thyroid  3. Vasovagal  response to blood draw this am LOC was very brief and recovered quickly   Butch Penny C. Carroll, [facility] [address] [place], [place] [address] [phone]

## 2017-10-24 NOTE — Addendum Note (Signed)
Encounter addended by: Sherran Needs, NP on: 10/24/2017 1:30 PM  Actions taken: Chief Complaint modified, Visit diagnoses modified

## 2017-10-25 LAB — T3, FREE: T3, Free: 3.5 pg/mL (ref 2.0–4.4)

## 2017-10-28 ENCOUNTER — Ambulatory Visit (HOSPITAL_COMMUNITY)
Admission: RE | Admit: 2017-10-28 | Discharge: 2017-10-28 | Disposition: A | Discharge status: Home / Self Care | Payer: PPO | Source: Ambulatory Visit | Attending: Nurse Practitioner | Admitting: Nurse Practitioner

## 2017-10-28 ENCOUNTER — Encounter (HOSPITAL_COMMUNITY): Payer: Self-pay | Admitting: *Deleted

## 2017-10-28 DIAGNOSIS — E785 Hyperlipidemia, unspecified: Secondary | ICD-10-CM | POA: Diagnosis not present

## 2017-10-28 DIAGNOSIS — I480 Paroxysmal atrial fibrillation: Secondary | ICD-10-CM | POA: Diagnosis not present

## 2017-10-28 DIAGNOSIS — I10 Essential (primary) hypertension: Secondary | ICD-10-CM | POA: Insufficient documentation

## 2017-10-28 NOTE — Progress Notes (Addendum)
  Echocardiogram 2D Echocardiogram has been performed.  Technically difficult study due to HR and respiratory rate.  Lauren Richard 10/28/2017, 9:45 AM

## 2017-11-03 ENCOUNTER — Other Ambulatory Visit: Payer: Self-pay

## 2017-11-03 ENCOUNTER — Encounter (HOSPITAL_COMMUNITY): Payer: Self-pay

## 2017-11-03 ENCOUNTER — Emergency Department (HOSPITAL_COMMUNITY): Payer: PPO

## 2017-11-03 ENCOUNTER — Inpatient Hospital Stay (HOSPITAL_COMMUNITY)
Admission: EM | Admit: 2017-11-03 | Discharge: 2017-11-06 | DRG: 445 | Disposition: A | Discharge status: Home / Self Care | Payer: PPO | Attending: Internal Medicine | Admitting: Internal Medicine

## 2017-11-03 DIAGNOSIS — E876 Hypokalemia: Secondary | ICD-10-CM | POA: Diagnosis not present

## 2017-11-03 DIAGNOSIS — N183 Chronic kidney disease, stage 3 (moderate): Secondary | ICD-10-CM | POA: Diagnosis not present

## 2017-11-03 DIAGNOSIS — Z90710 Acquired absence of both cervix and uterus: Secondary | ICD-10-CM

## 2017-11-03 DIAGNOSIS — Z8051 Family history of malignant neoplasm of kidney: Secondary | ICD-10-CM

## 2017-11-03 DIAGNOSIS — I129 Hypertensive chronic kidney disease with stage 1 through stage 4 chronic kidney disease, or unspecified chronic kidney disease: Secondary | ICD-10-CM | POA: Diagnosis not present

## 2017-11-03 DIAGNOSIS — K869 Disease of pancreas, unspecified: Secondary | ICD-10-CM | POA: Diagnosis not present

## 2017-11-03 DIAGNOSIS — E872 Acidosis: Secondary | ICD-10-CM | POA: Diagnosis not present

## 2017-11-03 DIAGNOSIS — R404 Transient alteration of awareness: Secondary | ICD-10-CM | POA: Diagnosis not present

## 2017-11-03 DIAGNOSIS — D649 Anemia, unspecified: Secondary | ICD-10-CM | POA: Diagnosis not present

## 2017-11-03 DIAGNOSIS — R5383 Other fatigue: Secondary | ICD-10-CM | POA: Diagnosis not present

## 2017-11-03 DIAGNOSIS — E0590 Thyrotoxicosis, unspecified without thyrotoxic crisis or storm: Secondary | ICD-10-CM | POA: Diagnosis not present

## 2017-11-03 DIAGNOSIS — I951 Orthostatic hypotension: Secondary | ICD-10-CM | POA: Diagnosis not present

## 2017-11-03 DIAGNOSIS — N179 Acute kidney failure, unspecified: Secondary | ICD-10-CM | POA: Diagnosis present

## 2017-11-03 DIAGNOSIS — Z801 Family history of malignant neoplasm of trachea, bronchus and lung: Secondary | ICD-10-CM

## 2017-11-03 DIAGNOSIS — D630 Anemia in neoplastic disease: Secondary | ICD-10-CM | POA: Diagnosis not present

## 2017-11-03 DIAGNOSIS — I10 Essential (primary) hypertension: Secondary | ICD-10-CM | POA: Diagnosis not present

## 2017-11-03 DIAGNOSIS — D3501 Benign neoplasm of right adrenal gland: Secondary | ICD-10-CM | POA: Diagnosis not present

## 2017-11-03 DIAGNOSIS — E8809 Other disorders of plasma-protein metabolism, not elsewhere classified: Secondary | ICD-10-CM | POA: Diagnosis not present

## 2017-11-03 DIAGNOSIS — Z6831 Body mass index (BMI) 31.0-31.9, adult: Secondary | ICD-10-CM

## 2017-11-03 DIAGNOSIS — Z96653 Presence of artificial knee joint, bilateral: Secondary | ICD-10-CM | POA: Diagnosis present

## 2017-11-03 DIAGNOSIS — R740 Nonspecific elevation of levels of transaminase and lactic acid dehydrogenase [LDH]: Secondary | ICD-10-CM | POA: Diagnosis not present

## 2017-11-03 DIAGNOSIS — K8050 Calculus of bile duct without cholangitis or cholecystitis without obstruction: Secondary | ICD-10-CM

## 2017-11-03 DIAGNOSIS — R55 Syncope and collapse: Secondary | ICD-10-CM | POA: Diagnosis not present

## 2017-11-03 DIAGNOSIS — C250 Malignant neoplasm of head of pancreas: Secondary | ICD-10-CM | POA: Diagnosis not present

## 2017-11-03 DIAGNOSIS — N189 Chronic kidney disease, unspecified: Secondary | ICD-10-CM | POA: Diagnosis not present

## 2017-11-03 DIAGNOSIS — K831 Obstruction of bile duct: Secondary | ICD-10-CM | POA: Diagnosis not present

## 2017-11-03 DIAGNOSIS — Z8249 Family history of ischemic heart disease and other diseases of the circulatory system: Secondary | ICD-10-CM

## 2017-11-03 DIAGNOSIS — E669 Obesity, unspecified: Secondary | ICD-10-CM | POA: Diagnosis not present

## 2017-11-03 DIAGNOSIS — E785 Hyperlipidemia, unspecified: Secondary | ICD-10-CM | POA: Diagnosis present

## 2017-11-03 DIAGNOSIS — K8689 Other specified diseases of pancreas: Secondary | ICD-10-CM

## 2017-11-03 DIAGNOSIS — E860 Dehydration: Secondary | ICD-10-CM | POA: Diagnosis present

## 2017-11-03 DIAGNOSIS — R1110 Vomiting, unspecified: Secondary | ICD-10-CM | POA: Diagnosis not present

## 2017-11-03 DIAGNOSIS — R112 Nausea with vomiting, unspecified: Secondary | ICD-10-CM | POA: Diagnosis not present

## 2017-11-03 DIAGNOSIS — R7401 Elevation of levels of liver transaminase levels: Secondary | ICD-10-CM

## 2017-11-03 DIAGNOSIS — R17 Unspecified jaundice: Secondary | ICD-10-CM | POA: Diagnosis not present

## 2017-11-03 DIAGNOSIS — R634 Abnormal weight loss: Secondary | ICD-10-CM

## 2017-11-03 DIAGNOSIS — R932 Abnormal findings on diagnostic imaging of liver and biliary tract: Secondary | ICD-10-CM | POA: Diagnosis not present

## 2017-11-03 LAB — HEPATIC FUNCTION PANEL
ALT: 179 U/L — ABNORMAL HIGH (ref 14–54)
AST: 196 U/L — ABNORMAL HIGH (ref 15–41)
Albumin: 2.7 g/dL — ABNORMAL LOW (ref 3.5–5.0)
Alkaline Phosphatase: 834 U/L — ABNORMAL HIGH (ref 38–126)
BILIRUBIN DIRECT: 13.9 mg/dL — AB (ref 0.1–0.5)
BILIRUBIN INDIRECT: 5.8 mg/dL — AB (ref 0.3–0.9)
BILIRUBIN TOTAL: 19.7 mg/dL — AB (ref 0.3–1.2)
Total Protein: 8.4 g/dL — ABNORMAL HIGH (ref 6.5–8.1)

## 2017-11-03 LAB — URINALYSIS, ROUTINE W REFLEX MICROSCOPIC
Glucose, UA: NEGATIVE mg/dL
Ketones, ur: NEGATIVE mg/dL
LEUKOCYTES UA: NEGATIVE
Nitrite: NEGATIVE
PROTEIN: 30 mg/dL — AB
Specific Gravity, Urine: 1.011 (ref 1.005–1.030)
pH: 5 (ref 5.0–8.0)

## 2017-11-03 LAB — CBC WITH DIFFERENTIAL/PLATELET
BASOS ABS: 0 10*3/uL (ref 0.0–0.1)
Basophils Relative: 0 %
EOS PCT: 1 %
Eosinophils Absolute: 0.1 10*3/uL (ref 0.0–0.7)
HCT: 31.2 % — ABNORMAL LOW (ref 36.0–46.0)
Hemoglobin: 10.9 g/dL — ABNORMAL LOW (ref 12.0–15.0)
LYMPHS PCT: 18 %
Lymphs Abs: 1.3 10*3/uL (ref 0.7–4.0)
MCH: 29.1 pg (ref 26.0–34.0)
MCHC: 34.9 g/dL (ref 30.0–36.0)
MCV: 83.2 fL (ref 78.0–100.0)
MONO ABS: 0.5 10*3/uL (ref 0.1–1.0)
Monocytes Relative: 8 %
Neutro Abs: 5.2 10*3/uL (ref 1.7–7.7)
Neutrophils Relative %: 73 %
PLATELETS: 370 10*3/uL (ref 150–400)
RBC: 3.75 MIL/uL — ABNORMAL LOW (ref 3.87–5.11)
RDW: 14.6 % (ref 11.5–15.5)
WBC: 7.1 10*3/uL (ref 4.0–10.5)

## 2017-11-03 LAB — I-STAT CG4 LACTIC ACID, ED: LACTIC ACID, VENOUS: 1.43 mmol/L (ref 0.5–1.9)

## 2017-11-03 LAB — BASIC METABOLIC PANEL
Anion gap: 15 (ref 5–15)
BUN: 56 mg/dL — AB (ref 6–20)
CALCIUM: 9.6 mg/dL (ref 8.9–10.3)
CO2: 16 mmol/L — AB (ref 22–32)
CREATININE: 3.15 mg/dL — AB (ref 0.44–1.00)
Chloride: 103 mmol/L (ref 101–111)
GFR calc non Af Amer: 13 mL/min — ABNORMAL LOW (ref >60)
GFR, EST AFRICAN AMERICAN: 15 mL/min — AB (ref >60)
GLUCOSE: 111 mg/dL — AB (ref 65–99)
Potassium: 4 mmol/L (ref 3.5–5.1)
Sodium: 134 mmol/L — ABNORMAL LOW (ref 135–145)

## 2017-11-03 LAB — PROTIME-INR
INR: 1.36
PROTHROMBIN TIME: 16.7 s — AB (ref 11.4–15.2)

## 2017-11-03 LAB — I-STAT TROPONIN, ED: TROPONIN I, POC: 0 ng/mL (ref 0.00–0.08)

## 2017-11-03 LAB — GLUCOSE, CAPILLARY: GLUCOSE-CAPILLARY: 109 mg/dL — AB (ref 65–99)

## 2017-11-03 LAB — MAGNESIUM: Magnesium: 1.5 mg/dL — ABNORMAL LOW (ref 1.7–2.4)

## 2017-11-03 LAB — LIPASE, BLOOD: Lipase: 89 U/L — ABNORMAL HIGH (ref 11–51)

## 2017-11-03 LAB — APTT: aPTT: 31 seconds (ref 24–36)

## 2017-11-03 MED ORDER — VITAMIN D 1000 UNITS PO TABS
2000.0000 [IU] | ORAL_TABLET | Freq: Every day | ORAL | Status: DC
  Administered 2017-11-04 – 2017-11-06 (×3): 2000 [IU] via ORAL
  Filled 2017-11-03 (×3): qty 2

## 2017-11-03 MED ORDER — ATENOLOL 25 MG PO TABS
25.0000 mg | ORAL_TABLET | Freq: Every day | ORAL | Status: DC
  Administered 2017-11-03 – 2017-11-06 (×4): 25 mg via ORAL
  Filled 2017-11-03 (×4): qty 1

## 2017-11-03 MED ORDER — ASPIRIN EC 81 MG PO TBEC
81.0000 mg | DELAYED_RELEASE_TABLET | Freq: Every day | ORAL | Status: DC
  Administered 2017-11-04 – 2017-11-06 (×3): 81 mg via ORAL
  Filled 2017-11-03 (×3): qty 1

## 2017-11-03 MED ORDER — ONDANSETRON HCL 4 MG/2ML IJ SOLN
4.0000 mg | Freq: Once | INTRAMUSCULAR | Status: AC
  Administered 2017-11-03: 4 mg via INTRAVENOUS
  Filled 2017-11-03: qty 2

## 2017-11-03 MED ORDER — POLYETHYLENE GLYCOL 3350 17 G PO PACK: 17.0000 g | PACK | Freq: Every day | ORAL | Status: DC | PRN

## 2017-11-03 MED ORDER — TRAMADOL HCL 50 MG PO TABS
50.0000 mg | ORAL_TABLET | Freq: Two times a day (BID) | ORAL | Status: DC | PRN
  Filled 2017-11-03: qty 1

## 2017-11-03 MED ORDER — AMLODIPINE BESYLATE 5 MG PO TABS
5.0000 mg | ORAL_TABLET | Freq: Every day | ORAL | Status: DC
  Administered 2017-11-04 – 2017-11-06 (×3): 5 mg via ORAL
  Filled 2017-11-03 (×3): qty 1

## 2017-11-03 MED ORDER — LIDOCAINE-PRILOCAINE 2.5-2.5 % EX KIT: 1.0000 "application " | PACK | CUTANEOUS | Status: DC | PRN

## 2017-11-03 MED ORDER — HEPARIN SODIUM (PORCINE) 5000 UNIT/ML IJ SOLN
5000.0000 [IU] | Freq: Three times a day (TID) | INTRAMUSCULAR | Status: DC
  Administered 2017-11-03 – 2017-11-06 (×6): 5000 [IU] via SUBCUTANEOUS
  Filled 2017-11-03 (×5): qty 1

## 2017-11-03 MED ORDER — ONDANSETRON HCL 4 MG/2ML IJ SOLN: 4.0000 mg | Freq: Three times a day (TID) | INTRAMUSCULAR | Status: DC | PRN

## 2017-11-03 MED ORDER — METHOCARBAMOL 500 MG PO TABS: 500.0000 mg | ORAL_TABLET | Freq: Three times a day (TID) | ORAL | Status: DC | PRN

## 2017-11-03 MED ORDER — SODIUM CHLORIDE 0.9 % IV BOLUS (SEPSIS)
1000.0000 mL | Freq: Once | INTRAVENOUS | Status: AC
  Administered 2017-11-03: 1000 mL via INTRAVENOUS

## 2017-11-03 MED ORDER — SODIUM CHLORIDE 0.9 % IV SOLN
INTRAVENOUS | Status: DC
  Administered 2017-11-03: 18:00:00 via INTRAVENOUS
  Administered 2017-11-03 – 2017-11-04 (×2): 1000 mL via INTRAVENOUS
  Administered 2017-11-04: 17:00:00 via INTRAVENOUS

## 2017-11-03 NOTE — ED Notes (Signed)
Diet ordered 

## 2017-11-03 NOTE — ED Triage Notes (Signed)
Pt arrives EMS from from MD office where she had syncope last 5-30 seconds while being examined by PA. Pt aroused but thought she was in the ED while at the office. Pt now alert c/o new jaundice.cancer/o dizziness on standing . Initial BP 87/58 but given 250cc NS PTA.

## 2017-11-03 NOTE — ED Notes (Signed)
Patient transported to CT 

## 2017-11-03 NOTE — Consult Note (Signed)
Reason for Consult: Biliary obstruction from a pancreatic head mass Referring Physician:[facility]   Lauren Richard HPI: This is a 82 year old female who presented to the ER with complaints of chest pain and SOB.  She was initially evaluated at her PCP's office, Dr. Baird Cancer, for complaints of fatigue and mild abdominal discomfort.  While in the office, she was feeling faint and had a syncopal episodes, which resulted in her transfer to [facility].  In the office she was noted to be overtly jaundiced and this was confirmed with blood work in the ER.  Her TB was at 13.0 with an AP of 834.  CT scan was significant for CBD dilation up to 18 mm and severe intrahepatic biliary ductal dilation.  In the head of the pancreas there is evidence of a 2 cm x  3 cm pancreatic head mass.  As a result of the findings a GI consultation was requested.  Past Medical History:  Diagnosis Date  . Arthritis    "all over" (04/26/2017)  . Essential hypertension 03/03/2017  . History of blood transfusion 1979   "when I had my hysterectomy"  . Hyperlipidemia   . Obesity 03/03/2017  . Osteoarthritis 03/03/2017    Past Surgical History:  Procedure Laterality Date  . BREAST CYST EXCISION Right    "cut 6 cysts out"  . CYST EXCISION Left    "off my toe"  . DILATION AND CURETTAGE OF UTERUS     "before hysterectomy"  . INGUINAL HERNIA REPAIR Left   . JOINT REPLACEMENT    . KNEE ARTHROSCOPY Bilateral   . REDUCTION MAMMAPLASTY Bilateral 1980  . TONSILLECTOMY     "when I small"  . TOTAL ABDOMINAL HYSTERECTOMY  1979  . TOTAL KNEE ARTHROPLASTY Right 04/26/2017  . TOTAL KNEE ARTHROPLASTY Left 04/26/2017   Procedure: LEFT TOTAL KNEE ARTHROPLASTY;  Surgeon: [name], MD;  Location: [facility];  Service: Orthopedics;  Laterality: Left;    Family History  Problem Relation Age of Onset  . Kidney cancer Mother   . Lung cancer Father   . Congestive Heart Failure Sister   . Asthma Sister   . Atrial fibrillation Sister    . Hypertension Sister   . Diabetes Sister     Social History:  reports that  has never smoked. she has never used smokeless tobacco. She reports that she drinks alcohol. She reports that she does not use drugs.  Allergies: No Known Allergies  Medications:  Scheduled:  Continuous: . sodium chloride     And  . sodium chloride      Results for orders placed or performed during the hospital encounter of 11/03/17 (from the past 24 hour(s))  Urinalysis, Routine w reflex microscopic     Status: Abnormal   Collection Time: 11/03/17 12:37 PM  Result Value Ref Range   Color, Urine AMBER (A) YELLOW   APPearance HAZY (A) CLEAR   Specific Gravity, Urine 1.011 1.005 - 1.030   pH 5.0 5.0 - 8.0   Glucose, UA NEGATIVE NEGATIVE mg/dL   Hgb urine dipstick SMALL (A) NEGATIVE   Bilirubin Urine MODERATE (A) NEGATIVE   Ketones, ur NEGATIVE NEGATIVE mg/dL   Protein, ur 30 (A) NEGATIVE mg/dL   Nitrite NEGATIVE NEGATIVE   Leukocytes, UA NEGATIVE NEGATIVE   RBC / HPF 0-5 0 - 5 RBC/hpf   WBC, UA 0-5 0 - 5 WBC/hpf   Bacteria, UA RARE (A) NONE SEEN   Squamous Epithelial / LPF 0-5 (A) NONE  SEEN   Mucus PRESENT    Hyaline Casts, UA PRESENT    Amorphous Crystal PRESENT   CBC with Differential     Status: Abnormal   Collection Time: 11/03/17  1:48 PM  Result Value Ref Range   WBC 7.1 4.0 - 10.5 K/uL   RBC 3.75 (L) 3.87 - 5.11 MIL/uL   Hemoglobin 10.9 (L) 12.0 - 15.0 g/dL   HCT 31.2 (L) 36.0 - 46.0 %   MCV 83.2 78.0 - 100.0 fL   MCH 29.1 26.0 - 34.0 pg   MCHC 34.9 30.0 - 36.0 g/dL   RDW 14.6 11.5 - 15.5 %   Platelets 370 150 - 400 K/uL   Neutrophils Relative % 73 %   Neutro Abs 5.2 1.7 - 7.7 K/uL   Lymphocytes Relative 18 %   Lymphs Abs 1.3 0.7 - 4.0 K/uL   Monocytes Relative 8 %   Monocytes Absolute 0.5 0.1 - 1.0 K/uL   Eosinophils Relative 1 %   Eosinophils Absolute 0.1 0.0 - 0.7 K/uL   Basophils Relative 0 %   Basophils Absolute 0.0 0.0 - 0.1 K/uL  Lipase, blood     Status: Abnormal    Collection Time: 11/03/17  1:48 PM  Result Value Ref Range   Lipase 89 (H) 11 - 51 U/L  Basic metabolic panel     Status: Abnormal   Collection Time: 11/03/17  1:48 PM  Result Value Ref Range   Sodium 134 (L) 135 - 145 mmol/L   Potassium 4.0 3.5 - 5.1 mmol/L   Chloride 103 101 - 111 mmol/L   CO2 16 (L) 22 - 32 mmol/L   Glucose, Bld 111 (H) 65 - 99 mg/dL   BUN 56 (H) 6 - 20 mg/dL   Creatinine, Ser 3.15 (H) 0.44 - 1.00 mg/dL   Calcium 9.6 8.9 - 10.3 mg/dL   GFR calc non Af Amer 13 (L) >60 mL/min   GFR calc Af Amer 15 (L) >60 mL/min   Anion gap 15 5 - 15  Hepatic function panel     Status: Abnormal   Collection Time: 11/03/17  1:48 PM  Result Value Ref Range   Total Protein 8.4 (H) 6.5 - 8.1 g/dL   Albumin 2.7 (L) 3.5 - 5.0 g/dL   AST 196 (H) 15 - 41 U/L   ALT 179 (H) 14 - 54 U/L   Alkaline Phosphatase 834 (H) 38 - 126 U/L   Total Bilirubin 19.7 (HH) 0.3 - 1.2 mg/dL   Bilirubin, Direct 13.9 (H) 0.1 - 0.5 mg/dL   Indirect Bilirubin 5.8 (H) 0.3 - 0.9 mg/dL  APTT     Status: None   Collection Time: 11/03/17  1:48 PM  Result Value Ref Range   aPTT 31 24 - 36 seconds  Protime-INR     Status: Abnormal   Collection Time: 11/03/17  1:48 PM  Result Value Ref Range   Prothrombin Time 16.7 (H) 11.4 - 15.2 seconds   INR 1.36   Magnesium     Status: Abnormal   Collection Time: 11/03/17  1:48 PM  Result Value Ref Range   Magnesium 1.5 (L) 1.7 - 2.4 mg/dL  I-stat troponin, ED     Status: None   Collection Time: 11/03/17  2:09 PM  Result Value Ref Range   Troponin i, poc 0.00 0.00 - 0.08 ng/mL   Comment 3          I-Stat CG4 Lactic Acid, ED     Status: None  Collection Time: 11/03/17  2:11 PM  Result Value Ref Range   Lactic Acid, Venous 1.43 0.5 - 1.9 mmol/L     Ct Abdomen Pelvis Wo Contrast  Result Date: 11/03/2017 CLINICAL DATA:  82 year old female with history of syncope. EXAM: CT ABDOMEN AND PELVIS WITHOUT CONTRAST TECHNIQUE: Multidetector CT imaging of the abdomen and  pelvis was performed following the standard protocol without IV contrast. COMPARISON:  No priors. FINDINGS: Lower chest: Mild scarring in the right lung base. Atherosclerotic calcifications in the left anterior descending, left circumflex and right coronary arteries. Small hiatal hernia. Hepatobiliary: No definite cystic or solid hepatic lesions are confidently identified on today's noncontrast CT examination. Although today's study is a noncontrast CT examination, there is moderate to severe intrahepatic biliary ductal dilatation. Common bile duct is markedly dilated measuring 18 mm in the porta hepatis. This abruptly terminates shortly above the expected location of the ampulla. Gallbladder is moderately distended, without evidence of gallstones. Intermediate attenuation material lying dependently in the gallbladder likely reflects biliary sludge. No surrounding inflammatory changes to suggest an acute cholecystitis at this time. Pancreas: In the head of the pancreas immediately below the level of common bile duct truncation (axial image 37 of series 3 and coronal image 50 of series 6) there is a mass-like area measuring 2.1 x 2.3 x 3.1 cm which is slightly higher attenuation than the remainder of the pancreatic parenchyma and demonstrates no internal fatty marbling as is seen in the remainder of the pancreatic parenchyma, concerning for primary pancreatic neoplasm. The body and tail of the pancreas are grossly normal in appearance. No pancreatic or peripancreatic fluid or inflammatory changes. Spleen: Unremarkable. Adrenals/Urinary Tract: Unenhanced appearance of the right kidney and left adrenal gland is normal. 1.3 cm low-attenuation lesion in the posterior aspect of the interpolar region of the left kidney is incompletely characterized on today's noncontrast CT examination, but is statistically likely a cyst. 1.6 cm low-attenuation (-3 HU) right adrenal nodule is compatible with an adenoma. No  hydroureteronephrosis. Urinary bladder is normal in appearance. Stomach/Bowel: Normal appearance of the stomach. No pathologic dilatation of small bowel or colon. The appendix is not confidently identified and may be surgically absent. Regardless, there are no inflammatory changes noted adjacent to the cecum to suggest the presence of an acute appendicitis at this time. Vascular/Lymphatic: Aortic atherosclerosis, without evidence of aneurysm or dissection in the abdominal or pelvic vasculature. No lymphadenopathy noted in the abdomen or pelvis. Reproductive: Status post hysterectomy. Ovaries are not confidently identified may be surgically absent or atrophic. Other: No significant volume of ascites.  No pneumoperitoneum. Musculoskeletal: There are no aggressive appearing lytic or blastic lesions noted in the visualized portions of the skeleton. IMPRESSION: 1. Findings are concerning for common bile duct obstruction, potentially related to a primary pancreatic neoplasm, as detailed above. At this time this is associated with moderate to severe intra and extrahepatic biliary ductal dilatation. Further evaluation with MRI of the abdomen with and without IV gadolinium with MRCP should be considered in the near future to better evaluate these findings. Alternatively, if the patient is not likely to be able to adequately hold her breath during an MRI examination, pancreatic protocol CT scan would be recommended. 2. Biliary sludge in the gallbladder. No findings to suggest an acute cholecystitis at this time. 3. Small right adrenal adenoma incidentally noted. 4. Aortic atherosclerosis, in addition to 3 vessel coronary artery disease. Assessment for potential risk factor modification, dietary therapy or pharmacologic therapy may be warranted, if clinically  indicated. Aortic Atherosclerosis (ICD10-I70.0). Electronically Signed   By: Vinnie Langton M.D.   On: 11/03/2017 16:08   Ct Head Wo Contrast  Result Date:  11/03/2017 CLINICAL DATA:  Syncope lasting between 5 and 30 seconds. EXAM: CT HEAD WITHOUT CONTRAST TECHNIQUE: Contiguous axial images were obtained from the base of the skull through the vertex without intravenous contrast. COMPARISON:  None. FINDINGS: Brain: No evidence of acute infarction, hemorrhage, hydrocephalus, extra-axial collection or mass lesion/mass effect. Vascular: Atherosclerotic calcification.  No hyperdense vessel. Skull: No acute or aggressive finding Sinuses/Orbits: Negative IMPRESSION: 1. Age normal appearance of the brain. 2. Atherosclerosis. Electronically Signed   By: Monte Fantasia M.D.   On: 11/03/2017 16:00    ROS:  As stated above in the HPI otherwise negative.  Blood pressure (!) 139/54, pulse 94, temperature 98.4 F (36.9 C), temperature source Oral, resp. rate (!) 26, height 5' 7"  (1.702 m), weight 88.5 kg (195 lb), SpO2 95 %.    PE: Gen: NAD, Alert and Oriented HEENT:  [place]/AT, EOMI Neck: Supple, no LAD Lungs: CTA Bilaterally CV: RRR without M/G/R ABM: Soft, NTND, +BS Ext: No C/C/E  Assessment/Plan: 1) Pancreatic head mass. 2) Biliary obstruction. 3) Chest pain - noncardiac.   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  I will perform an ERCP with stent placement, but these malignant obstruction cases are the most difficult to stent.  I had an indepth conversation about the current findings with the patient and her daughter.  I also discussed the risks of bleeding, infection, perforation, and pancreatitis.  Plan: 1) Attempt ERCP with metallic biliary stent tomorrow. 2) If the ERCP is unsuccessful, then IR will be consulted for placement of a PTC drain.  Derrious Bologna D 11/03/2017, 5:13 PM

## 2017-11-03 NOTE — ED Notes (Signed)
Dr. Ashok Cordia aware of billi

## 2017-11-03 NOTE — H&P (Signed)
History and Physical    Lauren Richard DDU:[id number] DOB: 1936/09/03 DOA: 11/03/2017  PCP: Glendale Chard, MD Patient coming from: Home  Chief Complaint: Weakness  HPI: Lauren Richard is a 82 y.o. female with medical history significant of hypertension, hyperlipidemia, obesity, tachycardia.  Patient presented to the emergency department from her PCPs office after a scheduled visit for evaluation of abnormal thyroid function testing.  While at her PCPs office she had a syncopal episode and was found to be significantly jaundiced.  She was sent via EMS to the emergency room for evaluation.  At the PCPs office, she did not receive any medications.  She had no associated chest pain, palpitations, lightheadedness.  ED Course: Vitals: Afebrile, pulse of 94, respiratory rate 19-26, BP 139/54. On room air. Labs: CO2 of 16, BUN of 56, creatinine of 3.15, Lipase of 196, alkaline phosphatase of 834, AST/ALT of 196/179, Tbili of 19.7 Imaging: CT scan with common bile duct obstruction secondary to pancreatic mass Medications/Course: 2L NS bolus, zofran  Review of Systems: Review of Systems  Constitutional: Negative for chills and fever.  Respiratory: Negative for cough, sputum production, shortness of breath and wheezing.   Cardiovascular: Positive for palpitations. Negative for chest pain and leg swelling.  Gastrointestinal: Positive for nausea and vomiting. Negative for abdominal pain, constipation and diarrhea.  Neurological: Positive for weakness.  Psychiatric/Behavioral: The patient is nervous/anxious.   All other systems reviewed and are negative.   Past Medical History:  Diagnosis Date  . Arthritis    "all over" (04/26/2017)  . Essential hypertension 03/03/2017  . History of blood transfusion 1979   "when I had my hysterectomy"  . Hyperlipidemia   . Obesity 03/03/2017  . Osteoarthritis 03/03/2017    Past Surgical History:  Procedure Laterality Date  . BREAST CYST EXCISION Right    "cut 6 cysts out"  . CYST EXCISION Left    "off my toe"  . DILATION AND CURETTAGE OF UTERUS     "before hysterectomy"  . INGUINAL HERNIA REPAIR Left   . JOINT REPLACEMENT    . KNEE ARTHROSCOPY Bilateral   . REDUCTION MAMMAPLASTY Bilateral 1980  . TONSILLECTOMY     "when I small"  . TOTAL ABDOMINAL HYSTERECTOMY  1979  . TOTAL KNEE ARTHROPLASTY Right 04/26/2017  . TOTAL KNEE ARTHROPLASTY Left 04/26/2017   Procedure: LEFT TOTAL KNEE ARTHROPLASTY;  Surgeon: [name], MD;  Location: [facility];  Service: Orthopedics;  Laterality: Left;     reports that  has never smoked. she has never used smokeless tobacco. She reports that she drinks alcohol. She reports that she does not use drugs.  No Known Allergies  Family History  Problem Relation Age of Onset  . Kidney cancer Mother   . Lung cancer Father   . Congestive Heart Failure Sister   . Asthma Sister   . Atrial fibrillation Sister   . Hypertension Sister   . Diabetes Sister    Prior to Admission medications   Medication Sig Start Date End Date Taking? Authorizing Provider  acetaminophen (TYLENOL) 500 MG tablet Take 1,000 mg by mouth daily as needed.    [provider]  amLODipine (NORVASC) 5 MG tablet Take 5 mg by mouth daily.  08/12/16   [provider]  aspirin 81 MG tablet Take 81 mg by mouth daily.    [provider]  atorvastatin (LIPITOR) 20 MG tablet Take 20 mg by mouth daily.    [provider]  Cholecalciferol (VITAMIN D) 2000  units tablet Take 2,000 Units by mouth daily.    [provider]  diltiazem (CARDIZEM CD) 180 MG 24 hr capsule Take 1 capsule (180 mg total) by mouth daily. 10/18/17   Varney Biles, MD  lidocaine-prilocaine (EMLA) cream Apply 1 application topically as needed (BACK PAIN).    [provider]  losartan-hydrochlorothiazide (HYZAAR) 100-25 MG tablet Take 1 tablet by mouth daily.  08/12/16   [provider]  methocarbamol (ROBAXIN) 500 MG  tablet Take 1 tablet (500 mg total) by mouth every 8 (eight) hours as needed for muscle spasms. 06/17/17   Cherylann Ratel, PA-C  Multiple Vitamins-Minerals (ONE-A-DAY WOMENS 50+ ADVANTAGE PO) Take 1 tablet by mouth daily.    [provider]  potassium chloride (K-DUR) 10 MEQ tablet Take 10 mEq by mouth daily.  10/13/16   [provider]  PRESCRIPTION MEDICATION CREAM FOR BACK PAIN    [provider]  Rivaroxaban (XARELTO) 15 MG TABS tablet Take 1 tablet (15 mg total) by mouth daily with supper. 10/18/17   Varney Biles, MD  traMADol (ULTRAM) 50 MG tablet Take 1 tablet (50 mg total) every 12 (twelve) hours as needed by mouth. 08/22/17   [name], MD    Physical Exam: Vitals:   11/03/17 1445 11/03/17 1500 11/03/17 1515 11/03/17 1530  BP: (!) 112/53 (!) 89/72 120/61 (!) 139/54  Pulse: 85 86 88 94  Resp: 19 (!) 24 19 (!) 26  Temp:      TempSrc:      SpO2: 98% 100% 98% 95%  Weight:      Height:         Constitutional: NAD, calm, comfortable Eyes: PERRL, lids. Scleral icterus ENMT: Mucous membranes are dry. Posterior pharynx clear of any exudate or lesions. Neck: normal, supple, no masses, no thyromegaly Respiratory: clear to auscultation bilaterally, no wheezing, no crackles. Normal respiratory effort. No accessory muscle use.  Cardiovascular: Regular rate and rhythm, no murmurs / rubs / gallops. No extremity edema. 2+ pedal pulses. No carotid bruits.  Abdomen: no tenderness, no masses palpated. No hepatosplenomegaly. Bowel sounds positive.  Musculoskeletal: no clubbing / cyanosis. No joint deformity upper and lower extremities. Good ROM, no contractures. Normal muscle tone.  Skin: no rashes, lesions, ulcers. No induration. Jaundice. Neurologic: CN 2-12 grossly intact. Sensation intact, DTR normal. Strength 5/5 in all 4.  Psychiatric: Normal judgment and insight. Alert and oriented x 3. Normal mood.    Labs on Admission: I have personally reviewed  following labs and imaging studies  CBC: Recent Labs  Lab 11/03/17 1348  WBC 7.1  NEUTROABS 5.2  HGB 10.9*  HCT 31.2*  MCV 83.2  PLT 209   Basic Metabolic Panel: Recent Labs  Lab 11/03/17 1348  NA 134*  K 4.0  CL 103  CO2 16*  GLUCOSE 111*  BUN 56*  CREATININE 3.15*  CALCIUM 9.6  MG 1.5*   GFR: Estimated Creatinine Clearance: 16.6 mL/min (A) (by C-G formula based on SCr of 3.15 mg/dL (H)).   Liver Function Tests: Recent Labs  Lab 11/03/17 1348  AST 196*  ALT 179*  ALKPHOS 834*  BILITOT 19.7*  PROT 8.4*  ALBUMIN 2.7*   Recent Labs  Lab 11/03/17 1348  LIPASE 89*   No results for input(s): AMMONIA in the last 168 hours. Coagulation Profile: Recent Labs  Lab 11/03/17 1348  INR 1.36   Urine analysis:    Component Value Date/Time   COLORURINE AMBER (A) 11/03/2017 1237   APPEARANCEUR HAZY (  A) 11/03/2017 1237   LABSPEC 1.011 11/03/2017 1237   PHURINE 5.0 11/03/2017 1237   GLUCOSEU NEGATIVE 11/03/2017 1237   HGBUR SMALL (A) 11/03/2017 1237   BILIRUBINUR MODERATE (A) 11/03/2017 1237   KETONESUR NEGATIVE 11/03/2017 1237   PROTEINUR 30 (A) 11/03/2017 1237   NITRITE NEGATIVE 11/03/2017 1237   LEUKOCYTESUR NEGATIVE 11/03/2017 1237   Sepsis Labs: !!!!!!!!!!!!!!!!!!!!!!!!!!!!!!!!!!!!!!!!!!!! @LABRCNTIP (procalcitonin:4,lacticidven:4) )No results found for this or any previous visit (from the past 240 hour(s)).   Radiological Exams on Admission: Ct Abdomen Pelvis Wo Contrast  Result Date: 11/03/2017 CLINICAL DATA:  82 year old female with history of syncope. EXAM: CT ABDOMEN AND PELVIS WITHOUT CONTRAST TECHNIQUE: Multidetector CT imaging of the abdomen and pelvis was performed following the standard protocol without IV contrast. COMPARISON:  No priors. FINDINGS: Lower chest: Mild scarring in the right lung base. Atherosclerotic calcifications in the left anterior descending, left circumflex and right coronary arteries. Small hiatal hernia. Hepatobiliary: No  definite cystic or solid hepatic lesions are confidently identified on today's noncontrast CT examination. Although today's study is a noncontrast CT examination, there is moderate to severe intrahepatic biliary ductal dilatation. Common bile duct is markedly dilated measuring 18 mm in the porta hepatis. This abruptly terminates shortly above the expected location of the ampulla. Gallbladder is moderately distended, without evidence of gallstones. Intermediate attenuation material lying dependently in the gallbladder likely reflects biliary sludge. No surrounding inflammatory changes to suggest an acute cholecystitis at this time. Pancreas: In the head of the pancreas immediately below the level of common bile duct truncation (axial image 37 of series 3 and coronal image 50 of series 6) there is a mass-like area measuring 2.1 x 2.3 x 3.1 cm which is slightly higher attenuation than the remainder of the pancreatic parenchyma and demonstrates no internal fatty marbling as is seen in the remainder of the pancreatic parenchyma, concerning for primary pancreatic neoplasm. The body and tail of the pancreas are grossly normal in appearance. No pancreatic or peripancreatic fluid or inflammatory changes. Spleen: Unremarkable. Adrenals/Urinary Tract: Unenhanced appearance of the right kidney and left adrenal gland is normal. 1.3 cm low-attenuation lesion in the posterior aspect of the interpolar region of the left kidney is incompletely characterized on today's noncontrast CT examination, but is statistically likely a cyst. 1.6 cm low-attenuation (-3 HU) right adrenal nodule is compatible with an adenoma. No hydroureteronephrosis. Urinary bladder is normal in appearance. Stomach/Bowel: Normal appearance of the stomach. No pathologic dilatation of small bowel or colon. The appendix is not confidently identified and may be surgically absent. Regardless, there are no inflammatory changes noted adjacent to the cecum to suggest  the presence of an acute appendicitis at this time. Vascular/Lymphatic: Aortic atherosclerosis, without evidence of aneurysm or dissection in the abdominal or pelvic vasculature. No lymphadenopathy noted in the abdomen or pelvis. Reproductive: Status post hysterectomy. Ovaries are not confidently identified may be surgically absent or atrophic. Other: No significant volume of ascites.  No pneumoperitoneum. Musculoskeletal: There are no aggressive appearing lytic or blastic lesions noted in the visualized portions of the skeleton. IMPRESSION: 1. Findings are concerning for common bile duct obstruction, potentially related to a primary pancreatic neoplasm, as detailed above. At this time this is associated with moderate to severe intra and extrahepatic biliary ductal dilatation. Further evaluation with MRI of the abdomen with and without IV gadolinium with MRCP should be considered in the near future to better evaluate these findings. Alternatively, if the patient is not likely to be able to adequately hold her breath  during an MRI examination, pancreatic protocol CT scan would be recommended. 2. Biliary sludge in the gallbladder. No findings to suggest an acute cholecystitis at this time. 3. Small right adrenal adenoma incidentally noted. 4. Aortic atherosclerosis, in addition to 3 vessel coronary artery disease. Assessment for potential risk factor modification, dietary therapy or pharmacologic therapy may be warranted, if clinically indicated. Aortic Atherosclerosis (ICD10-I70.0). Electronically Signed   By: Vinnie Langton M.D.   On: 11/03/2017 16:08   Ct Head Wo Contrast  Result Date: 11/03/2017 CLINICAL DATA:  Syncope lasting between 5 and 30 seconds. EXAM: CT HEAD WITHOUT CONTRAST TECHNIQUE: Contiguous axial images were obtained from the base of the skull through the vertex without intravenous contrast. COMPARISON:  None. FINDINGS: Brain: No evidence of acute infarction, hemorrhage, hydrocephalus,  extra-axial collection or mass lesion/mass effect. Vascular: Atherosclerotic calcification.  No hyperdense vessel. Skull: No acute or aggressive finding Sinuses/Orbits: Negative IMPRESSION: 1. Age normal appearance of the brain. 2. Atherosclerosis. Electronically Signed   By: Monte Fantasia M.D.   On: 11/03/2017 16:00    EKG: Independently reviewed. Sinus rhythm.  Assessment/Plan Active Problems:   Essential hypertension   Hyperlipidemia   Obesity   Acute kidney injury ([place])   Acute kidney injury Baseline creatinine of 1.4-1.7 recently. Up to 3.15 on admission. Patient significantly dry on exam. Likely secondary to volume depletion. -Continue IV fluids -CMP in AM  Common bile duct obstruction Secondary to pancreatic mass. Associated elevated bilirubin, ast, alt, alkaline phosphatase and lipase. INR within normal limits -GI recommendations -CMP in AM  Pancreatic mass Likely primary pancreatic cancer. -Oncology consult in AM -GI recommendations  Tachycardia Started on diltiazem. Initially thought to be atrial fibrillation. Appears likely secondary to hyperthyroidism. Patient with frequent PACs on telemetry while evaluating. -would prefer to use beta-blocker since etiology is likely secondary to hyperthroidism -discontinue diltiazem -start atenolol 25 mg daily for symptoms  Low TSH High T4 Undetectable TSH with elevated free T4 and normal free T3. No prior history. No goiter on examination. Would favor exogenous thyroid intake, but patient states she does not take any Synthroid/weight loss medication. -start atenolol for symptoms -outpatient endocrinology follow-up  Hyperlipidemia -Hold Lipitor in setting of elevated transaminases.  Hypertension Well controlled. -Hold lisinopril and hydrochlorothiazide secondary to AKI -Atenolol   DVT prophylaxis: heparin subq Code Status: Full code Family Communication: Daughter, son and daughter-in-law at bedside Disposition Plan:  Pending medical workup Consults called: Gastroenterology, Dr. Benson Norway Admission status: Inpatient, telemetry   Cordelia Poche, MD [facility] Pager [phone]  If 7PM-7AM, please contact night-coverage [email or web address] Password [email or web address]  11/03/2017, 4:58 PM

## 2017-11-03 NOTE — ED Provider Notes (Signed)
[facility] EMERGENCY DEPARTMENT Provider Note   CSN: [id number] Arrival date & time: 11/03/17  1222     History   Chief Complaint Chief Complaint  Patient presents with  . Loss of Consciousness    HPI Lauren Richard is a 82 y.o. female with a PMHx of arthritis, HTN, HLD, sinus tachycardia with PACs, and other conditions listed below, who presents to the ED via EMS from her PCPs office after having a brief episode of LOC.  Patient states that she was sitting in a chair and felt lightheaded, vomited 1x NBNB, and then had a brief episode of LOC for a few seconds.  When she awoke she immediately came back to baseline.  No reported seizure-like activity, she denies any tongue biting or incontinence.  They did not try anything for her symptoms, no known aggravating factors.    She mentions that for the last month she has not been feeling well, has had no appetite and has been very fatigued, having night sweats, had a 21 pound unexpected weight loss (from 210 in December to 195 on 10/24/17 then down to 188 today), nausea, and has had episodes of lightheadedness when she stands up.  She admits that she's not been eating much lately because of how she's been feeling.  Chart review reveals that she was seen in the ED on 10/18/17 for generalized weakness and loss of appetite, her BMP showed a creatinine of 1.76 and she had a magnesium of 1.3 but otherwise her other labs were fairly unremarkable, she did have an elevated d-dimer so she underwent a CTA of her chest which was negative for PE.  She was found to be in what was believed to be A.fib so she was started on diltiazem and Xarelto and referred to cardiology.  She was seen by cardiology on 10/24/17 and EKG there showed sinus tachycardia with PACs, they discontinued Xarelto but had her continue the diltiazem for the sinus tachycardia; during that visit she did have a vasovagal episode with brief LOC during the blood draw but quickly  improved back to baseline after being given PO fluids and laying down.  At that visit they also checked her TSH and T4, and found that her TSH was low and her T4 was elevated so they referred her back to her PCP to manage this.  She states that she took the diltiazem until they ran out (around 10/29/17) but never got a refill from her cardiologist so she hasn't had it since last week.  Her PCP is Glendale Chard at [facility]; she does not have a GI doctor that she sees.  Of note, her family did not notice a change in her coloration until someone pointed out to her that she looked jaundiced; they now agree that she looks yellower than normal.   She denies any fevers, chills, chest pain, shortness of breath, headache, vision changes, cough, URI symptoms, tongue biting, incontinence, vertiginous symptoms, seizure-like activity, abdominal pain, hematemesis, melena, hematochezia, diarrhea, constipation, dysuria, hematuria, myalgias, arthralgias, numbness, tingling, focal weakness, diaphoresis, or any other complaints at this time.   The history is provided by the patient and medical records. No language interpreter was used.  Loss of Consciousness   This is a recurrent problem. The current episode started less than 1 hour ago. The problem occurs rarely. The problem has been resolved. She lost consciousness for a period of less than one minute. Associated with: sitting in a chair. Associated symptoms include light-headedness,  nausea and vomiting. Pertinent negatives include abdominal pain, bladder incontinence, bowel incontinence, chest pain, confusion, diaphoresis, fever, focal sensory loss, focal weakness, headaches, seizures, vertigo and weakness. She has tried nothing for the symptoms. The treatment provided no relief. Her past medical history is significant for HTN.    Past Medical History:  Diagnosis Date  . Arthritis    "all over" (04/26/2017)  . Essential hypertension 03/03/2017  . History  of blood transfusion 1979   "when I had my hysterectomy"  . Hyperlipidemia   . Obesity 03/03/2017  . Osteoarthritis 03/03/2017    Patient Active Problem List   Diagnosis Date Noted  . Osteoarthritis of left knee 04/26/2017  . S/P total knee replacement using cement, left 04/26/2017  . Essential hypertension 03/03/2017  . Hyperlipidemia 03/03/2017  . Osteoarthritis 03/03/2017  . Obesity 03/03/2017  . Status post left foot surgery 12/03/2015  . Hammertoe 11/03/2015    Past Surgical History:  Procedure Laterality Date  . BREAST CYST EXCISION Right    "cut 6 cysts out"  . CYST EXCISION Left    "off my toe"  . DILATION AND CURETTAGE OF UTERUS     "before hysterectomy"  . INGUINAL HERNIA REPAIR Left   . JOINT REPLACEMENT    . KNEE ARTHROSCOPY Bilateral   . REDUCTION MAMMAPLASTY Bilateral 1980  . TONSILLECTOMY     "when I small"  . TOTAL ABDOMINAL HYSTERECTOMY  1979  . TOTAL KNEE ARTHROPLASTY Right 04/26/2017  . TOTAL KNEE ARTHROPLASTY Left 04/26/2017   Procedure: LEFT TOTAL KNEE ARTHROPLASTY;  Surgeon: [name], MD;  Location: [facility];  Service: Orthopedics;  Laterality: Left;    OB History    No data available       Home Medications    Prior to Admission medications   Medication Sig Start Date End Date Taking? Authorizing Provider  acetaminophen (TYLENOL) 500 MG tablet Take 1,000 mg by mouth daily as needed.    [provider]  amLODipine (NORVASC) 5 MG tablet Take 5 mg by mouth daily.  08/12/16   [provider]  aspirin 81 MG tablet Take 81 mg by mouth daily.    [provider]  atorvastatin (LIPITOR) 20 MG tablet Take 20 mg by mouth daily.    [provider]  Cholecalciferol (VITAMIN D) 2000 units tablet Take 2,000 Units by mouth daily.    [provider]  diltiazem (CARDIZEM CD) 180 MG 24 hr capsule Take 1 capsule (180 mg total) by mouth daily. 10/18/17   Varney Biles, MD  lidocaine-prilocaine (EMLA) cream Apply 1  application topically as needed (BACK PAIN).    [provider]  losartan-hydrochlorothiazide (HYZAAR) 100-25 MG tablet Take 1 tablet by mouth daily.  08/12/16   [provider]  methocarbamol (ROBAXIN) 500 MG tablet Take 1 tablet (500 mg total) by mouth every 8 (eight) hours as needed for muscle spasms. 06/17/17   Cherylann Ratel, PA-C  Multiple Vitamins-Minerals (ONE-A-DAY WOMENS 50+ ADVANTAGE PO) Take 1 tablet by mouth daily.    [provider]  potassium chloride (K-DUR) 10 MEQ tablet Take 10 mEq by mouth daily.  10/13/16   [provider]  PRESCRIPTION MEDICATION CREAM FOR BACK PAIN    [provider]  Rivaroxaban (XARELTO) 15 MG TABS tablet Take 1 tablet (15 mg total) by mouth daily with supper. 10/18/17   Varney Biles, MD  traMADol (ULTRAM) 50 MG tablet Take 1 tablet (50 mg total) every 12 (twelve) hours as needed by mouth.  08/22/17   [name], MD    Family History Family History  Problem Relation Age of Onset  . Kidney cancer Mother   . Lung cancer Father   . Congestive Heart Failure Sister   . Asthma Sister   . Atrial fibrillation Sister   . Hypertension Sister   . Diabetes Sister     Social History Social History   Tobacco Use  . Smoking status: Never Smoker  . Smokeless tobacco: Never Used  Substance Use Topics  . Alcohol use: Yes    Alcohol/week: 0.0 oz    Comment: 04/26/2017 "might have 1 drink/month; if that"  . Drug use: No     Allergies   Patient has no known allergies.   Review of Systems Review of Systems  Constitutional: Positive for appetite change, fatigue and unexpected weight change. Negative for chills, diaphoresis and fever.       +night sweats  HENT: Negative for rhinorrhea and sore throat.        No tongue biting  Eyes: Negative for visual disturbance.  Respiratory: Negative for cough and shortness of breath.   Cardiovascular: Positive for syncope. Negative for chest pain.    Gastrointestinal: Positive for nausea and vomiting. Negative for abdominal pain, blood in stool, bowel incontinence, constipation and diarrhea.  Genitourinary: Negative for bladder incontinence, difficulty urinating (no incontinence), dysuria and hematuria.  Musculoskeletal: Negative for arthralgias and myalgias.  Skin: Negative for color change.  Allergic/Immunologic: Negative for immunocompromised state.  Neurological: Positive for syncope and light-headedness. Negative for vertigo, focal weakness, seizures, weakness, numbness and headaches.  Psychiatric/Behavioral: Negative for confusion.   All other systems reviewed and are negative for acute change except as noted in the HPI.    Physical Exam Updated Vital Signs BP 99/68   Pulse 87   Temp 98.4 F (36.9 C) (Oral)   Resp (!) 26   Ht 5' 7"  (1.702 m)   Wt 88.5 kg (195 lb)   SpO2 96%   BMI 30.54 kg/m    Physical Exam  Constitutional: She is oriented to person, place, and time. Vital signs are normal. She appears well-developed and well-nourished.  Non-toxic appearance. No distress.  Afebrile, nontoxic, NAD, markedly jaundiced. BP soft in the 90s/70-80s but somewhat similar to her most recent visit  HENT:  Head: Normocephalic and atraumatic.  Mouth/Throat: Oropharynx is clear and moist. Mucous membranes are dry.  Dry lips  Eyes: Conjunctivae and EOM are normal. Pupils are equal, round, and reactive to light. Right eye exhibits no discharge. Left eye exhibits no discharge. Scleral icterus is present.  PERRL, EOMI, no nystagmus, marked scleral icterus noted  Neck: Trachea normal, normal range of motion and phonation normal. Neck supple. No tracheal tenderness, no spinous process tenderness and no muscular tenderness present. No neck rigidity. Normal range of motion present. Thyroid mass present. No thyromegaly present.  FROM intact without spinous process TTP, no bony stepoffs or deformities, no paraspinous muscle TTP or muscle  spasms. No rigidity or meningeal signs. No bruising or swelling.  Thyroid without significant enlargement, nontender and mobile, with possible small nodule to the L lobe at the upper edge  Cardiovascular: Normal rate, regular rhythm, normal heart sounds and intact distal pulses. Exam reveals no gallop and no friction rub.  No murmur heard. HR 80s during exam  Pulmonary/Chest: Effort normal and breath sounds normal. No respiratory distress. She has no decreased breath sounds. She has no wheezes. She has no rhonchi. She has no rales.  Abdominal:  Soft. Normal appearance and bowel sounds are normal. She exhibits no distension. There is tenderness in the epigastric area. There is no rigidity, no rebound, no guarding, no CVA tenderness, no tenderness at McBurney's point and negative Murphy's sign.  Soft, nondistended, +BS throughout, with mild epigastric TTP, no r/g/r, neg murphy's, neg mcburney's, no CVA TTP   Musculoskeletal: Normal range of motion.  MAE x4 Strength and sensation grossly intact in all extremities Distal pulses intact  Neurological: She is alert and oriented to person, place, and time. She has normal strength. No cranial nerve deficit or sensory deficit. Coordination normal. GCS eye subscore is 4. GCS verbal subscore is 5. GCS motor subscore is 6.  CN 2-12 grossly intact A&O x4 GCS 15 Sensation and strength intact Coordination with finger-to-nose WNL Neg pronator drift   Skin: Skin is warm, dry and intact. No rash noted.  Markedly jaundiced  Psychiatric: She has a normal mood and affect.  Nursing note and vitals reviewed.  ORTHOSTATICS: (done after 1st liter bolus) Orthostatic VS for the past 24 hrs:  BP- Lying Pulse- Lying BP- Sitting Pulse- Sitting BP- Standing at 0 minutes Pulse- Standing at 0 minutes  11/03/17 1527 117/74 92 139/54 104 126/66 108      ED Treatments / Results  Labs (all labs ordered are listed, but only abnormal results are displayed) Labs Reviewed   URINALYSIS, ROUTINE W REFLEX MICROSCOPIC - Abnormal; Notable for the following components:      Result Value   Color, Urine AMBER (*)    APPearance HAZY (*)    Hgb urine dipstick SMALL (*)    Bilirubin Urine MODERATE (*)    Protein, ur 30 (*)    Bacteria, UA RARE (*)    Squamous Epithelial / LPF 0-5 (*)    All other components within normal limits  CBC WITH DIFFERENTIAL/PLATELET - Abnormal; Notable for the following components:   RBC 3.75 (*)    Hemoglobin 10.9 (*)    HCT 31.2 (*)    All other components within normal limits  LIPASE, BLOOD - Abnormal; Notable for the following components:   Lipase 89 (*)    All other components within normal limits  BASIC METABOLIC PANEL - Abnormal; Notable for the following components:   Sodium 134 (*)    CO2 16 (*)    Glucose, Bld 111 (*)    BUN 56 (*)    Creatinine, Ser 3.15 (*)    GFR calc non Af Amer 13 (*)    GFR calc Af Amer 15 (*)    All other components within normal limits  HEPATIC FUNCTION PANEL - Abnormal; Notable for the following components:   Total Protein 8.4 (*)    Albumin 2.7 (*)    AST 196 (*)    ALT 179 (*)    Alkaline Phosphatase 834 (*)    Total Bilirubin 19.7 (*)    Bilirubin, Direct 13.9 (*)    Indirect Bilirubin 5.8 (*)    All other components within normal limits  PROTIME-INR - Abnormal; Notable for the following components:   Prothrombin Time 16.7 (*)    All other components within normal limits  MAGNESIUM - Abnormal; Notable for the following components:   Magnesium 1.5 (*)    All other components within normal limits  APTT  I-STAT TROPONIN, ED  I-STAT CG4 LACTIC ACID, ED    LABS FROM CARDIOLOGY VISIT 10/24/17: Results for orders placed or performed during the hospital encounter of 21/19/41  Basic metabolic panel  Result Value Ref Range   Sodium 138 135 - 145 mmol/L   Potassium 3.4 (L) 3.5 - 5.1 mmol/L   Chloride 107 101 - 111 mmol/L   CO2 19 (L) 22 - 32 mmol/L   Glucose, Bld 126 (H) 65 - 99 mg/dL    BUN 23 (H) 6 - 20 mg/dL   Creatinine, Ser 1.44 (H) 0.44 - 1.00 mg/dL   Calcium 9.3 8.9 - 10.3 mg/dL   GFR calc non Af Amer 34 (L) >60 mL/min   GFR calc Af Amer 39 (L) >60 mL/min   Anion gap 12 5 - 15  TSH  Result Value Ref Range   TSH <0.010 (L) 0.350 - 4.500 uIU/mL  T4, free  Result Value Ref Range   Free T4 1.73 (H) 0.61 - 1.12 ng/dL  T3, free  Result Value Ref Range   T3, Free 3.5 2.0 - 4.4 pg/mL    EKG  EKG Interpretation  Date/Time:  Thursday November 03 2017 12:49:00 EST Ventricular Rate:  89 PR Interval:  160 QRS Duration: 76 QT Interval:  358 QTC Calculation: 435 R Axis:   14 Text Interpretation:  Sinus rhythm with Premature atrial complexes Otherwise normal ECG Confirmed by Lajean Saver [phone]) on 11/03/2017 2:01:06 PM       Radiology Ct Abdomen Pelvis Wo Contrast  Result Date: 11/03/2017 CLINICAL DATA:  82 year old female with history of syncope. EXAM: CT ABDOMEN AND PELVIS WITHOUT CONTRAST TECHNIQUE: Multidetector CT imaging of the abdomen and pelvis was performed following the standard protocol without IV contrast. COMPARISON:  No priors. FINDINGS: Lower chest: Mild scarring in the right lung base. Atherosclerotic calcifications in the left anterior descending, left circumflex and right coronary arteries. Small hiatal hernia. Hepatobiliary: No definite cystic or solid hepatic lesions are confidently identified on today's noncontrast CT examination. Although today's study is a noncontrast CT examination, there is moderate to severe intrahepatic biliary ductal dilatation. Common bile duct is markedly dilated measuring 18 mm in the porta hepatis. This abruptly terminates shortly above the expected location of the ampulla. Gallbladder is moderately distended, without evidence of gallstones. Intermediate attenuation material lying dependently in the gallbladder likely reflects biliary sludge. No surrounding inflammatory changes to suggest an acute cholecystitis at this  time. Pancreas: In the head of the pancreas immediately below the level of common bile duct truncation (axial image 37 of series 3 and coronal image 50 of series 6) there is a mass-like area measuring 2.1 x 2.3 x 3.1 cm which is slightly higher attenuation than the remainder of the pancreatic parenchyma and demonstrates no internal fatty marbling as is seen in the remainder of the pancreatic parenchyma, concerning for primary pancreatic neoplasm. The body and tail of the pancreas are grossly normal in appearance. No pancreatic or peripancreatic fluid or inflammatory changes. Spleen: Unremarkable. Adrenals/Urinary Tract: Unenhanced appearance of the right kidney and left adrenal gland is normal. 1.3 cm low-attenuation lesion in the posterior aspect of the interpolar region of the left kidney is incompletely characterized on today's noncontrast CT examination, but is statistically likely a cyst. 1.6 cm low-attenuation (-3 HU) right adrenal nodule is compatible with an adenoma. No hydroureteronephrosis. Urinary bladder is normal in appearance. Stomach/Bowel: Normal appearance of the stomach. No pathologic dilatation of small bowel or colon. The appendix is not confidently identified and may be surgically absent. Regardless, there are no inflammatory changes noted adjacent to the cecum to suggest the presence of an acute appendicitis at this time. Vascular/Lymphatic: Aortic atherosclerosis, without evidence of  aneurysm or dissection in the abdominal or pelvic vasculature. No lymphadenopathy noted in the abdomen or pelvis. Reproductive: Status post hysterectomy. Ovaries are not confidently identified may be surgically absent or atrophic. Other: No significant volume of ascites.  No pneumoperitoneum. Musculoskeletal: There are no aggressive appearing lytic or blastic lesions noted in the visualized portions of the skeleton. IMPRESSION: 1. Findings are concerning for common bile duct obstruction, potentially related to a  primary pancreatic neoplasm, as detailed above. At this time this is associated with moderate to severe intra and extrahepatic biliary ductal dilatation. Further evaluation with MRI of the abdomen with and without IV gadolinium with MRCP should be considered in the near future to better evaluate these findings. Alternatively, if the patient is not likely to be able to adequately hold her breath during an MRI examination, pancreatic protocol CT scan would be recommended. 2. Biliary sludge in the gallbladder. No findings to suggest an acute cholecystitis at this time. 3. Small right adrenal adenoma incidentally noted. 4. Aortic atherosclerosis, in addition to 3 vessel coronary artery disease. Assessment for potential risk factor modification, dietary therapy or pharmacologic therapy may be warranted, if clinically indicated. Aortic Atherosclerosis (ICD10-I70.0). Electronically Signed   By: Vinnie Langton M.D.   On: 11/03/2017 16:08   Ct Head Wo Contrast  Result Date: 11/03/2017 CLINICAL DATA:  Syncope lasting between 5 and 30 seconds. EXAM: CT HEAD WITHOUT CONTRAST TECHNIQUE: Contiguous axial images were obtained from the base of the skull through the vertex without intravenous contrast. COMPARISON:  None. FINDINGS: Brain: No evidence of acute infarction, hemorrhage, hydrocephalus, extra-axial collection or mass lesion/mass effect. Vascular: Atherosclerotic calcification.  No hyperdense vessel. Skull: No acute or aggressive finding Sinuses/Orbits: Negative IMPRESSION: 1. Age normal appearance of the brain. 2. Atherosclerosis. Electronically Signed   By: Monte Fantasia M.D.   On: 11/03/2017 16:00     Dg Chest 2 View  Result Date: 10/18/2017 CLINICAL DATA:  Chest pain. EXAM: CHEST  2 VIEW COMPARISON:  04/15/2017. FINDINGS: Mediastinum and hilar structures normal. Heart size normal. Low lung volumes with mild basilar atelectasis. No pleural effusion or pneumothorax. Degenerative changes thoracic spine.  IMPRESSION: No acute cardiopulmonary disease. Electronically Signed   By: Marcello Moores  Register   On: 10/18/2017 09:19   Ct Angio Chest Pe W And/or Wo Contrast  Result Date: 10/18/2017 CLINICAL DATA:  Chest pain for 10 days.  Anorexia and weakness. EXAM: CT ANGIOGRAPHY CHEST WITH CONTRAST TECHNIQUE: Multidetector CT imaging of the chest was performed using the standard protocol during bolus administration of intravenous contrast. Multiplanar CT image reconstructions and MIPs were obtained to evaluate the vascular anatomy. CONTRAST:  25m ISOVUE-370 IOPAMIDOL (ISOVUE-370) INJECTION 76% COMPARISON:  None. FINDINGS: Cardiovascular: The heart is borderline enlarged for age. No pericardial effusion. There is mild tortuosity, ectasia and calcification of the thoracic aorta. No dissection. Branch vessels are patent. Atherosclerotic calcifications noted. Scattered coronary artery calcifications are noted. The pulmonary arterial tree is fairly well opacified. No definite filling defects to suggest pulmonary embolism. Mediastinum/Nodes: No mediastinal or hilar mass or adenopathy. The esophagus is grossly normal. Lungs/Pleura: No infiltrates, edema or effusions. Basilar scarring changes and streaky atelectasis. No worrisome pulmonary lesions. No pleural effusion. Upper Abdomen: No significant upper abdominal findings. Musculoskeletal: No breast masses, supraclavicular or axillary adenopathy. Thyroid goiter noted. The bony structures are intact. Review of the MIP images confirms the above findings. IMPRESSION: 1. No CT findings for pulmonary embolism. 2. Mild tortuosity, ectasia and calcification of the thoracic aorta but no dissection. 3.  No acute pulmonary findings. Aortic Atherosclerosis (ICD10-I70.0). Electronically Signed   By: Marijo Sanes M.D.   On: 10/18/2017 16:45    Echo 10/28/17: Study Conclusions - Left ventricle: The cavity size was normal. Wall thickness was   increased in a pattern of mild LVH. Systolic  function was   vigorous. The estimated ejection fraction was in the range of 65%   to 70%. Wall motion was normal; there were no regional wall   motion abnormalities. Doppler parameters are consistent with   abnormal left ventricular relaxation (grade 1 diastolic   dysfunction). - Aortic valve: There was trivial regurgitation. - Mitral valve: Calcified annulus.  Impressions: - Normal LV systolic function; mild diastolic dysfunction; mild   LVH; trace AI; mild TR.    Procedures Procedures (including critical care time)  Medications Ordered in ED Medications  sodium chloride 0.9 % bolus 1,000 mL (0 mLs Intravenous Stopped 11/03/17 1533)    And  sodium chloride 0.9 % bolus 1,000 mL (not administered)    And  0.9 %  sodium chloride infusion (not administered)  ondansetron (ZOFRAN) injection 4 mg (4 mg Intravenous Given 11/03/17 1407)     Initial Impression / Assessment and Plan / ED Course  I have reviewed the triage vital signs and the nursing notes.  Pertinent labs & imaging results that were available during my care of the patient were reviewed by me and considered in my medical decision making (see chart for details).     82 y.o. female here after syncopal episode at [facility] office. Pt states she's been fatigued and had lack of appetite, 21lb wt loss, night sweats x1 month; came to the ED on 10/18/17, labs were fairly reassuring (Cr 1.76 and Mg 1.3 but otherwise mostly unremarkable; D-dimer elevated so CTA chest done but was negative for PE), she was noted to be in Afib so started on diltiazem and xarelto. Seen by cardiology on 10/24/17, felt not to be AFib but rather sinus tachy with PACs, told to stop xarelto but continue diltiazem, however she ran out this week (around 10/29/17) because they had not provided a refill. At that visit, her TSH was low and T4 was high so she was told to f/up with her PCP about this. Today she was at the PCP visit and she was sitting in a chair and felt  lightheaded and had brief LOC; no seizure like activity. Came back to baseline immediately. She did vomit right before she had LOC.  On exam, pt markedly jaundiced, BP soft in the 90s/70s-80s which is close to what it's been at her cardiology visit from 10/24/17 and during her ED visit on 10/18/17; no tachycardia or hypoxia; thyroid nontender, possible nodule to L lobe, mobile with swallowing; abdomen soft with mild epigastric TTP, neg murphy's sign and nonperitoneal; distal pulses intact. PERRL, EOMI; cerebellar functioning WNL, no focal neuro deficits, did not get pt up to ambulate her yet.  Biggest concern is for cancer; I suspect the lightheadedness and syncope could be from dehydration/malnutrition. Will get labs, orthostatics, CT head and CT abd/pelv, and give fluids and zofran. Will reassess shortly. Discussed case with my attending Dr. Ashok Cordia who agrees with plan.   3:19 PM CBC w/diff with anemia which appears to be consistent with values from July 2018 but is new from 10/18/17 labs (Hgb 12.9/Hct 38.7 at that visit); otherwise WNL, MCV and MCH WNL, platelets WNL, so doubt hemolytic anemia. BMP with acute kidney injury, Cr 3.15 BUN 56, low bicarb of  16, but no anion gap. LFTs with AST 196, ALT 179, alk phos 834, Tbili 19.7, and low albumin 2.7; direct and indirect bili pending. Lipase elevated at 89. PT/INR slightly elevated 16.7/1.36. APTT WNL. Mg level low at 1.5. Lactic WNL. Trop neg. EKG with sinus rhythm and PACs, otherwise unremarkable.  Given her AKI, will have to change CT A/P to without contrast. I anticipate we will need to admit her. Still waiting on CT head and U/A as well, and orthostatics, although her BP is improving with fluids (now up to 110-120s/50-60s). Will continue to monitor and reassess shortly.   4:38 PM Orthostatics done after 1st bolus, and were negative.  U/A with no evidence of infection, +bili as expected. CT head negative for acute findings. CT abd/pelv confirming what was  suspected, finding a 2.1 x 2.3 x 3.1 cm mass at the pancreatic head, causing common bile duct obstruction and associated intra and extra hepatic biliary dilatation, as well as biliary sludge in gallbladder and small right adrenal adenoma. This was the anticipated finding, unfortunately. I explained in detail the findings to the pt and her family, and explained the potential that this may be cancerous. Discussed need for admission for further work up and to clear the obstruction. Pt and family understand and agree with plan. Will proceed with admission.   4:55 PM Dr. Cordelia Poche of [facility] returning page and will admit. He would like me to touch base with GI so that they'll be on board with her care while she's admitted. Will consult them now. Holding orders to be placed by admitting team.   5:09 PM Dr. Benson Norway of GI returning page, will consult on patient while she's here. Pt to be admitted by [facility] team. Please see their notes and consultant's notes for further documentation of care. I appreciate their help with this pleasant pt's care. Pt stable at time of admission.    Final Clinical Impressions(s) / ED Diagnoses   Final diagnoses:  Syncope, unspecified syncope type  Fatigue, unspecified type  AKI (acute kidney injury) ([place])  Dehydration  Hyperbilirubinemia  Jaundice  Anemia, unspecified type  Nausea and vomiting in adult patient  Weight loss  Transaminitis  Hypoalbuminemia  Biliary obstruction  Pancreatic mass    ED Discharge Orders    [address] [place], [place] 11/03/17 1709    Lajean Saver, MD 11/04/17 1616

## 2017-11-03 NOTE — H&P (View-Only) (Signed)
Reason for Consult: Biliary obstruction from a pancreatic head mass Referring Physician:[facility]   GENNY CAULDER HPI: This is a 82 year old female who presented to the ER with complaints of chest pain and SOB.  She was initially evaluated at her PCP's office, Dr. Baird Cancer, for complaints of fatigue and mild abdominal discomfort.  While in the office, she was feeling faint and had a syncopal episodes, which resulted in her transfer to [facility].  In the office she was noted to be overtly jaundiced and this was confirmed with blood work in the ER.  Her TB was at 13.0 with an AP of 834.  CT scan was significant for CBD dilation up to 18 mm and severe intrahepatic biliary ductal dilation.  In the head of the pancreas there is evidence of a 2 cm x  3 cm pancreatic head mass.  As a result of the findings a GI consultation was requested.  Past Medical History:  Diagnosis Date  . Arthritis    "all over" (04/26/2017)  . Essential hypertension 03/03/2017  . History of blood transfusion 1979   "when I had my hysterectomy"  . Hyperlipidemia   . Obesity 03/03/2017  . Osteoarthritis 03/03/2017    Past Surgical History:  Procedure Laterality Date  . BREAST CYST EXCISION Right    "cut 6 cysts out"  . CYST EXCISION Left    "off my toe"  . DILATION AND CURETTAGE OF UTERUS     "before hysterectomy"  . INGUINAL HERNIA REPAIR Left   . JOINT REPLACEMENT    . KNEE ARTHROSCOPY Bilateral   . REDUCTION MAMMAPLASTY Bilateral 1980  . TONSILLECTOMY     "when I small"  . TOTAL ABDOMINAL HYSTERECTOMY  1979  . TOTAL KNEE ARTHROPLASTY Right 04/26/2017  . TOTAL KNEE ARTHROPLASTY Left 04/26/2017   Procedure: LEFT TOTAL KNEE ARTHROPLASTY;  Surgeon: [name], MD;  Location: [facility];  Service: Orthopedics;  Laterality: Left;    Family History  Problem Relation Age of Onset  . Kidney cancer Mother   . Lung cancer Father   . Congestive Heart Failure Sister   . Asthma Sister   . Atrial fibrillation Sister    . Hypertension Sister   . Diabetes Sister     Social History:  reports that  has never smoked. she has never used smokeless tobacco. She reports that she drinks alcohol. She reports that she does not use drugs.  Allergies: No Known Allergies  Medications:  Scheduled:  Continuous: . sodium chloride     And  . sodium chloride      Results for orders placed or performed during the hospital encounter of 11/03/17 (from the past 24 hour(s))  Urinalysis, Routine w reflex microscopic     Status: Abnormal   Collection Time: 11/03/17 12:37 PM  Result Value Ref Range   Color, Urine AMBER (A) YELLOW   APPearance HAZY (A) CLEAR   Specific Gravity, Urine 1.011 1.005 - 1.030   pH 5.0 5.0 - 8.0   Glucose, UA NEGATIVE NEGATIVE mg/dL   Hgb urine dipstick SMALL (A) NEGATIVE   Bilirubin Urine MODERATE (A) NEGATIVE   Ketones, ur NEGATIVE NEGATIVE mg/dL   Protein, ur 30 (A) NEGATIVE mg/dL   Nitrite NEGATIVE NEGATIVE   Leukocytes, UA NEGATIVE NEGATIVE   RBC / HPF 0-5 0 - 5 RBC/hpf   WBC, UA 0-5 0 - 5 WBC/hpf   Bacteria, UA RARE (A) NONE SEEN   Squamous Epithelial / LPF 0-5 (A) NONE  SEEN   Mucus PRESENT    Hyaline Casts, UA PRESENT    Amorphous Crystal PRESENT   CBC with Differential     Status: Abnormal   Collection Time: 11/03/17  1:48 PM  Result Value Ref Range   WBC 7.1 4.0 - 10.5 K/uL   RBC 3.75 (L) 3.87 - 5.11 MIL/uL   Hemoglobin 10.9 (L) 12.0 - 15.0 g/dL   HCT 31.2 (L) 36.0 - 46.0 %   MCV 83.2 78.0 - 100.0 fL   MCH 29.1 26.0 - 34.0 pg   MCHC 34.9 30.0 - 36.0 g/dL   RDW 14.6 11.5 - 15.5 %   Platelets 370 150 - 400 K/uL   Neutrophils Relative % 73 %   Neutro Abs 5.2 1.7 - 7.7 K/uL   Lymphocytes Relative 18 %   Lymphs Abs 1.3 0.7 - 4.0 K/uL   Monocytes Relative 8 %   Monocytes Absolute 0.5 0.1 - 1.0 K/uL   Eosinophils Relative 1 %   Eosinophils Absolute 0.1 0.0 - 0.7 K/uL   Basophils Relative 0 %   Basophils Absolute 0.0 0.0 - 0.1 K/uL  Lipase, blood     Status: Abnormal    Collection Time: 11/03/17  1:48 PM  Result Value Ref Range   Lipase 89 (H) 11 - 51 U/L  Basic metabolic panel     Status: Abnormal   Collection Time: 11/03/17  1:48 PM  Result Value Ref Range   Sodium 134 (L) 135 - 145 mmol/L   Potassium 4.0 3.5 - 5.1 mmol/L   Chloride 103 101 - 111 mmol/L   CO2 16 (L) 22 - 32 mmol/L   Glucose, Bld 111 (H) 65 - 99 mg/dL   BUN 56 (H) 6 - 20 mg/dL   Creatinine, Ser 3.15 (H) 0.44 - 1.00 mg/dL   Calcium 9.6 8.9 - 10.3 mg/dL   GFR calc non Af Amer 13 (L) >60 mL/min   GFR calc Af Amer 15 (L) >60 mL/min   Anion gap 15 5 - 15  Hepatic function panel     Status: Abnormal   Collection Time: 11/03/17  1:48 PM  Result Value Ref Range   Total Protein 8.4 (H) 6.5 - 8.1 g/dL   Albumin 2.7 (L) 3.5 - 5.0 g/dL   AST 196 (H) 15 - 41 U/L   ALT 179 (H) 14 - 54 U/L   Alkaline Phosphatase 834 (H) 38 - 126 U/L   Total Bilirubin 19.7 (HH) 0.3 - 1.2 mg/dL   Bilirubin, Direct 13.9 (H) 0.1 - 0.5 mg/dL   Indirect Bilirubin 5.8 (H) 0.3 - 0.9 mg/dL  APTT     Status: None   Collection Time: 11/03/17  1:48 PM  Result Value Ref Range   aPTT 31 24 - 36 seconds  Protime-INR     Status: Abnormal   Collection Time: 11/03/17  1:48 PM  Result Value Ref Range   Prothrombin Time 16.7 (H) 11.4 - 15.2 seconds   INR 1.36   Magnesium     Status: Abnormal   Collection Time: 11/03/17  1:48 PM  Result Value Ref Range   Magnesium 1.5 (L) 1.7 - 2.4 mg/dL  I-stat troponin, ED     Status: None   Collection Time: 11/03/17  2:09 PM  Result Value Ref Range   Troponin i, poc 0.00 0.00 - 0.08 ng/mL   Comment 3          I-Stat CG4 Lactic Acid, ED     Status: None  Collection Time: 11/03/17  2:11 PM  Result Value Ref Range   Lactic Acid, Venous 1.43 0.5 - 1.9 mmol/L     Ct Abdomen Pelvis Wo Contrast  Result Date: 11/03/2017 CLINICAL DATA:  82 year old female with history of syncope. EXAM: CT ABDOMEN AND PELVIS WITHOUT CONTRAST TECHNIQUE: Multidetector CT imaging of the abdomen and  pelvis was performed following the standard protocol without IV contrast. COMPARISON:  No priors. FINDINGS: Lower chest: Mild scarring in the right lung base. Atherosclerotic calcifications in the left anterior descending, left circumflex and right coronary arteries. Small hiatal hernia. Hepatobiliary: No definite cystic or solid hepatic lesions are confidently identified on today's noncontrast CT examination. Although today's study is a noncontrast CT examination, there is moderate to severe intrahepatic biliary ductal dilatation. Common bile duct is markedly dilated measuring 18 mm in the porta hepatis. This abruptly terminates shortly above the expected location of the ampulla. Gallbladder is moderately distended, without evidence of gallstones. Intermediate attenuation material lying dependently in the gallbladder likely reflects biliary sludge. No surrounding inflammatory changes to suggest an acute cholecystitis at this time. Pancreas: In the head of the pancreas immediately below the level of common bile duct truncation (axial image 37 of series 3 and coronal image 50 of series 6) there is a mass-like area measuring 2.1 x 2.3 x 3.1 cm which is slightly higher attenuation than the remainder of the pancreatic parenchyma and demonstrates no internal fatty marbling as is seen in the remainder of the pancreatic parenchyma, concerning for primary pancreatic neoplasm. The body and tail of the pancreas are grossly normal in appearance. No pancreatic or peripancreatic fluid or inflammatory changes. Spleen: Unremarkable. Adrenals/Urinary Tract: Unenhanced appearance of the right kidney and left adrenal gland is normal. 1.3 cm low-attenuation lesion in the posterior aspect of the interpolar region of the left kidney is incompletely characterized on today's noncontrast CT examination, but is statistically likely a cyst. 1.6 cm low-attenuation (-3 HU) right adrenal nodule is compatible with an adenoma. No  hydroureteronephrosis. Urinary bladder is normal in appearance. Stomach/Bowel: Normal appearance of the stomach. No pathologic dilatation of small bowel or colon. The appendix is not confidently identified and may be surgically absent. Regardless, there are no inflammatory changes noted adjacent to the cecum to suggest the presence of an acute appendicitis at this time. Vascular/Lymphatic: Aortic atherosclerosis, without evidence of aneurysm or dissection in the abdominal or pelvic vasculature. No lymphadenopathy noted in the abdomen or pelvis. Reproductive: Status post hysterectomy. Ovaries are not confidently identified may be surgically absent or atrophic. Other: No significant volume of ascites.  No pneumoperitoneum. Musculoskeletal: There are no aggressive appearing lytic or blastic lesions noted in the visualized portions of the skeleton. IMPRESSION: 1. Findings are concerning for common bile duct obstruction, potentially related to a primary pancreatic neoplasm, as detailed above. At this time this is associated with moderate to severe intra and extrahepatic biliary ductal dilatation. Further evaluation with MRI of the abdomen with and without IV gadolinium with MRCP should be considered in the near future to better evaluate these findings. Alternatively, if the patient is not likely to be able to adequately hold her breath during an MRI examination, pancreatic protocol CT scan would be recommended. 2. Biliary sludge in the gallbladder. No findings to suggest an acute cholecystitis at this time. 3. Small right adrenal adenoma incidentally noted. 4. Aortic atherosclerosis, in addition to 3 vessel coronary artery disease. Assessment for potential risk factor modification, dietary therapy or pharmacologic therapy may be warranted, if clinically  indicated. Aortic Atherosclerosis (ICD10-I70.0). Electronically Signed   By: Vinnie Langton M.D.   On: 11/03/2017 16:08   Ct Head Wo Contrast  Result Date:  11/03/2017 CLINICAL DATA:  Syncope lasting between 5 and 30 seconds. EXAM: CT HEAD WITHOUT CONTRAST TECHNIQUE: Contiguous axial images were obtained from the base of the skull through the vertex without intravenous contrast. COMPARISON:  None. FINDINGS: Brain: No evidence of acute infarction, hemorrhage, hydrocephalus, extra-axial collection or mass lesion/mass effect. Vascular: Atherosclerotic calcification.  No hyperdense vessel. Skull: No acute or aggressive finding Sinuses/Orbits: Negative IMPRESSION: 1. Age normal appearance of the brain. 2. Atherosclerosis. Electronically Signed   By: Monte Fantasia M.D.   On: 11/03/2017 16:00    ROS:  As stated above in the HPI otherwise negative.  Blood pressure (!) 139/54, pulse 94, temperature 98.4 F (36.9 C), temperature source Oral, resp. rate (!) 26, height 5' 7"  (1.702 m), weight 88.5 kg (195 lb), SpO2 95 %.    PE: Gen: NAD, Alert and Oriented HEENT:  /AT, EOMI Neck: Supple, no LAD Lungs: CTA Bilaterally CV: RRR without M/G/R ABM: Soft, NTND, +BS Ext: No C/C/E  Assessment/Plan: 1) Pancreatic head mass. 2) Biliary obstruction. 3) Chest pain - noncardiac.   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  I will perform an ERCP with stent placement, but these malignant obstruction cases are the most difficult to stent.  I had an indepth conversation about the current findings with the patient and her daughter.  I also discussed the risks of bleeding, infection, perforation, and pancreatitis.  Plan: 1) Attempt ERCP with metallic biliary stent tomorrow. 2) If the ERCP is unsuccessful, then IR will be consulted for placement of a PTC drain.  Roux Brandy D 11/03/2017, 5:13 PM

## 2017-11-03 NOTE — H&P (View-Only) (Signed)
Reason for Consult: Biliary obstruction from a pancreatic head mass Referring Physician:[facility]   Lauren Richard HPI: This is a 82 year old female who presented to the ER with complaints of chest pain and SOB.  She was initially evaluated at her PCP's office, Dr. Baird Cancer, for complaints of fatigue and mild abdominal discomfort.  While in the office, she was feeling faint and had a syncopal episodes, which resulted in her transfer to [facility].  In the office she was noted to be overtly jaundiced and this was confirmed with blood work in the ER.  Her TB was at 13.0 with an AP of 834.  CT scan was significant for CBD dilation up to 18 mm and severe intrahepatic biliary ductal dilation.  In the head of the pancreas there is evidence of a 2 cm x  3 cm pancreatic head mass.  As a result of the findings a GI consultation was requested.  Past Medical History:  Diagnosis Date  . Arthritis    "all over" (04/26/2017)  . Essential hypertension 03/03/2017  . History of blood transfusion 1979   "when I had my hysterectomy"  . Hyperlipidemia   . Obesity 03/03/2017  . Osteoarthritis 03/03/2017    Past Surgical History:  Procedure Laterality Date  . BREAST CYST EXCISION Right    "cut 6 cysts out"  . CYST EXCISION Left    "off my toe"  . DILATION AND CURETTAGE OF UTERUS     "before hysterectomy"  . INGUINAL HERNIA REPAIR Left   . JOINT REPLACEMENT    . KNEE ARTHROSCOPY Bilateral   . REDUCTION MAMMAPLASTY Bilateral 1980  . TONSILLECTOMY     "when I small"  . TOTAL ABDOMINAL HYSTERECTOMY  1979  . TOTAL KNEE ARTHROPLASTY Right 04/26/2017  . TOTAL KNEE ARTHROPLASTY Left 04/26/2017   Procedure: LEFT TOTAL KNEE ARTHROPLASTY;  Surgeon: [name], MD;  Location: [place];  Service: Orthopedics;  Laterality: Left;    Family History  Problem Relation Age of Onset  . Kidney cancer Mother   . Lung cancer Father   . Congestive Heart Failure Sister   . Asthma Sister   . Atrial fibrillation Sister    . Hypertension Sister   . Diabetes Sister     Social History:  reports that  has never smoked. she has never used smokeless tobacco. She reports that she drinks alcohol. She reports that she does not use drugs.  Allergies: No Known Allergies  Medications:  Scheduled:  Continuous: . sodium chloride     And  . sodium chloride      Results for orders placed or performed during the hospital encounter of 11/03/17 (from the past 24 hour(s))  Urinalysis, Routine w reflex microscopic     Status: Abnormal   Collection Time: 11/03/17 12:37 PM  Result Value Ref Range   Color, Urine AMBER (A) YELLOW   APPearance HAZY (A) CLEAR   Specific Gravity, Urine 1.011 1.005 - 1.030   pH 5.0 5.0 - 8.0   Glucose, UA NEGATIVE NEGATIVE mg/dL   Hgb urine dipstick SMALL (A) NEGATIVE   Bilirubin Urine MODERATE (A) NEGATIVE   Ketones, ur NEGATIVE NEGATIVE mg/dL   Protein, ur 30 (A) NEGATIVE mg/dL   Nitrite NEGATIVE NEGATIVE   Leukocytes, UA NEGATIVE NEGATIVE   RBC / HPF 0-5 0 - 5 RBC/hpf   WBC, UA 0-5 0 - 5 WBC/hpf   Bacteria, UA RARE (A) NONE SEEN   Squamous Epithelial / LPF 0-5 (A) NONE  SEEN   Mucus PRESENT    Hyaline Casts, UA PRESENT    Amorphous Crystal PRESENT   CBC with Differential     Status: Abnormal   Collection Time: 11/03/17  1:48 PM  Result Value Ref Range   WBC 7.1 4.0 - 10.5 K/uL   RBC 3.75 (L) 3.87 - 5.11 MIL/uL   Hemoglobin 10.9 (L) 12.0 - 15.0 g/dL   HCT 31.2 (L) 36.0 - 46.0 %   MCV 83.2 78.0 - 100.0 fL   MCH 29.1 26.0 - 34.0 pg   MCHC 34.9 30.0 - 36.0 g/dL   RDW 14.6 11.5 - 15.5 %   Platelets 370 150 - 400 K/uL   Neutrophils Relative % 73 %   Neutro Abs 5.2 1.7 - 7.7 K/uL   Lymphocytes Relative 18 %   Lymphs Abs 1.3 0.7 - 4.0 K/uL   Monocytes Relative 8 %   Monocytes Absolute 0.5 0.1 - 1.0 K/uL   Eosinophils Relative 1 %   Eosinophils Absolute 0.1 0.0 - 0.7 K/uL   Basophils Relative 0 %   Basophils Absolute 0.0 0.0 - 0.1 K/uL  Lipase, blood     Status: Abnormal    Collection Time: 11/03/17  1:48 PM  Result Value Ref Range   Lipase 89 (H) 11 - 51 U/L  Basic metabolic panel     Status: Abnormal   Collection Time: 11/03/17  1:48 PM  Result Value Ref Range   Sodium 134 (L) 135 - 145 mmol/L   Potassium 4.0 3.5 - 5.1 mmol/L   Chloride 103 101 - 111 mmol/L   CO2 16 (L) 22 - 32 mmol/L   Glucose, Bld 111 (H) 65 - 99 mg/dL   BUN 56 (H) 6 - 20 mg/dL   Creatinine, Ser 3.15 (H) 0.44 - 1.00 mg/dL   Calcium 9.6 8.9 - 10.3 mg/dL   GFR calc non Af Amer 13 (L) >60 mL/min   GFR calc Af Amer 15 (L) >60 mL/min   Anion gap 15 5 - 15  Hepatic function panel     Status: Abnormal   Collection Time: 11/03/17  1:48 PM  Result Value Ref Range   Total Protein 8.4 (H) 6.5 - 8.1 g/dL   Albumin 2.7 (L) 3.5 - 5.0 g/dL   AST 196 (H) 15 - 41 U/L   ALT 179 (H) 14 - 54 U/L   Alkaline Phosphatase 834 (H) 38 - 126 U/L   Total Bilirubin 19.7 (HH) 0.3 - 1.2 mg/dL   Bilirubin, Direct 13.9 (H) 0.1 - 0.5 mg/dL   Indirect Bilirubin 5.8 (H) 0.3 - 0.9 mg/dL  APTT     Status: None   Collection Time: 11/03/17  1:48 PM  Result Value Ref Range   aPTT 31 24 - 36 seconds  Protime-INR     Status: Abnormal   Collection Time: 11/03/17  1:48 PM  Result Value Ref Range   Prothrombin Time 16.7 (H) 11.4 - 15.2 seconds   INR 1.36   Magnesium     Status: Abnormal   Collection Time: 11/03/17  1:48 PM  Result Value Ref Range   Magnesium 1.5 (L) 1.7 - 2.4 mg/dL  I-stat troponin, ED     Status: None   Collection Time: 11/03/17  2:09 PM  Result Value Ref Range   Troponin i, poc 0.00 0.00 - 0.08 ng/mL   Comment 3          I-Stat CG4 Lactic Acid, ED     Status: None  Collection Time: 11/03/17  2:11 PM  Result Value Ref Range   Lactic Acid, Venous 1.43 0.5 - 1.9 mmol/L     Ct Abdomen Pelvis Wo Contrast  Result Date: 11/03/2017 CLINICAL DATA:  82 year old female with history of syncope. EXAM: CT ABDOMEN AND PELVIS WITHOUT CONTRAST TECHNIQUE: Multidetector CT imaging of the abdomen and  pelvis was performed following the standard protocol without IV contrast. COMPARISON:  No priors. FINDINGS: Lower chest: Mild scarring in the right lung base. Atherosclerotic calcifications in the left anterior descending, left circumflex and right coronary arteries. Small hiatal hernia. Hepatobiliary: No definite cystic or solid hepatic lesions are confidently identified on today's noncontrast CT examination. Although today's study is a noncontrast CT examination, there is moderate to severe intrahepatic biliary ductal dilatation. Common bile duct is markedly dilated measuring 18 mm in the porta hepatis. This abruptly terminates shortly above the expected location of the ampulla. Gallbladder is moderately distended, without evidence of gallstones. Intermediate attenuation material lying dependently in the gallbladder likely reflects biliary sludge. No surrounding inflammatory changes to suggest an acute cholecystitis at this time. Pancreas: In the head of the pancreas immediately below the level of common bile duct truncation (axial image 37 of series 3 and coronal image 50 of series 6) there is a mass-like area measuring 2.1 x 2.3 x 3.1 cm which is slightly higher attenuation than the remainder of the pancreatic parenchyma and demonstrates no internal fatty marbling as is seen in the remainder of the pancreatic parenchyma, concerning for primary pancreatic neoplasm. The body and tail of the pancreas are grossly normal in appearance. No pancreatic or peripancreatic fluid or inflammatory changes. Spleen: Unremarkable. Adrenals/Urinary Tract: Unenhanced appearance of the right kidney and left adrenal gland is normal. 1.3 cm low-attenuation lesion in the posterior aspect of the interpolar region of the left kidney is incompletely characterized on today's noncontrast CT examination, but is statistically likely a cyst. 1.6 cm low-attenuation (-3 HU) right adrenal nodule is compatible with an adenoma. No  hydroureteronephrosis. Urinary bladder is normal in appearance. Stomach/Bowel: Normal appearance of the stomach. No pathologic dilatation of small bowel or colon. The appendix is not confidently identified and may be surgically absent. Regardless, there are no inflammatory changes noted adjacent to the cecum to suggest the presence of an acute appendicitis at this time. Vascular/Lymphatic: Aortic atherosclerosis, without evidence of aneurysm or dissection in the abdominal or pelvic vasculature. No lymphadenopathy noted in the abdomen or pelvis. Reproductive: Status post hysterectomy. Ovaries are not confidently identified may be surgically absent or atrophic. Other: No significant volume of ascites.  No pneumoperitoneum. Musculoskeletal: There are no aggressive appearing lytic or blastic lesions noted in the visualized portions of the skeleton. IMPRESSION: 1. Findings are concerning for common bile duct obstruction, potentially related to a primary pancreatic neoplasm, as detailed above. At this time this is associated with moderate to severe intra and extrahepatic biliary ductal dilatation. Further evaluation with MRI of the abdomen with and without IV gadolinium with MRCP should be considered in the near future to better evaluate these findings. Alternatively, if the patient is not likely to be able to adequately hold her breath during an MRI examination, pancreatic protocol CT scan would be recommended. 2. Biliary sludge in the gallbladder. No findings to suggest an acute cholecystitis at this time. 3. Small right adrenal adenoma incidentally noted. 4. Aortic atherosclerosis, in addition to 3 vessel coronary artery disease. Assessment for potential risk factor modification, dietary therapy or pharmacologic therapy may be warranted, if clinically  indicated. Aortic Atherosclerosis (ICD10-I70.0). Electronically Signed   By: Vinnie Langton M.D.   On: 11/03/2017 16:08   Ct Head Wo Contrast  Result Date:  11/03/2017 CLINICAL DATA:  Syncope lasting between 5 and 30 seconds. EXAM: CT HEAD WITHOUT CONTRAST TECHNIQUE: Contiguous axial images were obtained from the base of the skull through the vertex without intravenous contrast. COMPARISON:  None. FINDINGS: Brain: No evidence of acute infarction, hemorrhage, hydrocephalus, extra-axial collection or mass lesion/mass effect. Vascular: Atherosclerotic calcification.  No hyperdense vessel. Skull: No acute or aggressive finding Sinuses/Orbits: Negative IMPRESSION: 1. Age normal appearance of the brain. 2. Atherosclerosis. Electronically Signed   By: Monte Fantasia M.D.   On: 11/03/2017 16:00    ROS:  As stated above in the HPI otherwise negative.  Blood pressure (!) 139/54, pulse 94, temperature 98.4 F (36.9 C), temperature source Oral, resp. rate (!) 26, height 5' 7"  (1.702 m), weight 88.5 kg (195 lb), SpO2 95 %.    PE: Gen: NAD, Alert and Oriented HEENT:  [place]/AT, EOMI Neck: Supple, no LAD Lungs: CTA Bilaterally CV: RRR without M/G/R ABM: Soft, NTND, +BS Ext: No C/C/E  Assessment/Plan: 1) Pancreatic head mass. 2) Biliary obstruction. 3) Chest pain - noncardiac.   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  I will perform an ERCP with stent placement, but these malignant obstruction cases are the most difficult to stent.  I had an indepth conversation about the current findings with the patient and her daughter.  I also discussed the risks of bleeding, infection, perforation, and pancreatitis.  Plan: 1) Attempt ERCP with metallic biliary stent tomorrow. 2) If the ERCP is unsuccessful, then IR will be consulted for placement of a PTC drain.  Taraoluwa Thakur D 11/03/2017, 5:13 PM

## 2017-11-04 ENCOUNTER — Inpatient Hospital Stay (HOSPITAL_COMMUNITY): Payer: PPO | Admitting: Anesthesiology

## 2017-11-04 ENCOUNTER — Encounter (HOSPITAL_COMMUNITY)
Admission: EM | Disposition: A | Discharge status: Home / Self Care | Payer: Self-pay | Source: Home / Self Care | Attending: Internal Medicine

## 2017-11-04 ENCOUNTER — Inpatient Hospital Stay (HOSPITAL_COMMUNITY): Payer: PPO

## 2017-11-04 DIAGNOSIS — R55 Syncope and collapse: Secondary | ICD-10-CM

## 2017-11-04 DIAGNOSIS — I10 Essential (primary) hypertension: Secondary | ICD-10-CM

## 2017-11-04 DIAGNOSIS — N189 Chronic kidney disease, unspecified: Secondary | ICD-10-CM

## 2017-11-04 DIAGNOSIS — K869 Disease of pancreas, unspecified: Secondary | ICD-10-CM

## 2017-11-04 DIAGNOSIS — N179 Acute kidney failure, unspecified: Secondary | ICD-10-CM

## 2017-11-04 DIAGNOSIS — E860 Dehydration: Secondary | ICD-10-CM

## 2017-11-04 DIAGNOSIS — K831 Obstruction of bile duct: Principal | ICD-10-CM

## 2017-11-04 HISTORY — PX: ERCP: SHX5425

## 2017-11-04 LAB — CBC
HEMATOCRIT: 28.1 % — AB (ref 36.0–46.0)
Hemoglobin: 9.6 g/dL — ABNORMAL LOW (ref 12.0–15.0)
MCH: 28.7 pg (ref 26.0–34.0)
MCHC: 34.2 g/dL (ref 30.0–36.0)
MCV: 84.1 fL (ref 78.0–100.0)
PLATELETS: 336 10*3/uL (ref 150–400)
RBC: 3.34 MIL/uL — ABNORMAL LOW (ref 3.87–5.11)
RDW: 14.9 % (ref 11.5–15.5)
WBC: 5.3 10*3/uL (ref 4.0–10.5)

## 2017-11-04 LAB — COMPREHENSIVE METABOLIC PANEL
ALK PHOS: 704 U/L — AB (ref 38–126)
ALT: 143 U/L — ABNORMAL HIGH (ref 14–54)
AST: 163 U/L — AB (ref 15–41)
Albumin: 2.3 g/dL — ABNORMAL LOW (ref 3.5–5.0)
Anion gap: 11 (ref 5–15)
BILIRUBIN TOTAL: 16.6 mg/dL — AB (ref 0.3–1.2)
BUN: 47 mg/dL — AB (ref 6–20)
CALCIUM: 8.7 mg/dL — AB (ref 8.9–10.3)
CO2: 15 mmol/L — ABNORMAL LOW (ref 22–32)
Chloride: 111 mmol/L (ref 101–111)
Creatinine, Ser: 2.16 mg/dL — ABNORMAL HIGH (ref 0.44–1.00)
GFR calc Af Amer: 24 mL/min — ABNORMAL LOW (ref >60)
GFR, EST NON AFRICAN AMERICAN: 21 mL/min — AB (ref >60)
GLUCOSE: 93 mg/dL (ref 65–99)
POTASSIUM: 3.5 mmol/L (ref 3.5–5.1)
Sodium: 137 mmol/L (ref 135–145)
TOTAL PROTEIN: 6.9 g/dL (ref 6.5–8.1)

## 2017-11-04 LAB — MAGNESIUM: MAGNESIUM: 1.2 mg/dL — AB (ref 1.7–2.4)

## 2017-11-04 LAB — PROTIME-INR
INR: 1.39
PROTHROMBIN TIME: 16.9 s — AB (ref 11.4–15.2)

## 2017-11-04 SURGERY — ERCP, WITH INTERVENTION IF INDICATED
Anesthesia: General

## 2017-11-04 MED ORDER — CIPROFLOXACIN IN D5W 400 MG/200ML IV SOLN
400.0000 mg | Freq: Once | INTRAVENOUS | Status: AC
  Administered 2017-11-04: 400 mg via INTRAVENOUS

## 2017-11-04 MED ORDER — IOPAMIDOL (ISOVUE-300) INJECTION 61%
INTRAVENOUS | Status: AC
  Filled 2017-11-04: qty 50

## 2017-11-04 MED ORDER — ONDANSETRON HCL 4 MG/2ML IJ SOLN
INTRAMUSCULAR | Status: DC | PRN
  Administered 2017-11-04: 4 mg via INTRAVENOUS

## 2017-11-04 MED ORDER — LACTATED RINGERS IV SOLN
INTRAVENOUS | Status: DC | PRN
  Administered 2017-11-04: 10:00:00 via INTRAVENOUS

## 2017-11-04 MED ORDER — EPHEDRINE SULFATE 50 MG/ML IJ SOLN
INTRAMUSCULAR | Status: DC | PRN
  Administered 2017-11-04: 10 mg via INTRAVENOUS

## 2017-11-04 MED ORDER — GLUCAGON HCL RDNA (DIAGNOSTIC) 1 MG IJ SOLR
INTRAMUSCULAR | Status: AC
  Filled 2017-11-04: qty 1

## 2017-11-04 MED ORDER — MAGNESIUM SULFATE 2 GM/50ML IV SOLN
2.0000 g | Freq: Once | INTRAVENOUS | Status: AC
  Administered 2017-11-04: 2 g via INTRAVENOUS
  Filled 2017-11-04: qty 50

## 2017-11-04 MED ORDER — INDOMETHACIN 50 MG RE SUPP
RECTAL | Status: AC
  Filled 2017-11-04: qty 2

## 2017-11-04 MED ORDER — SUGAMMADEX SODIUM 500 MG/5ML IV SOLN
INTRAVENOUS | Status: DC | PRN
  Administered 2017-11-04: 250 mg via INTRAVENOUS

## 2017-11-04 MED ORDER — CIPROFLOXACIN IN D5W 400 MG/200ML IV SOLN
INTRAVENOUS | Status: AC
  Filled 2017-11-04: qty 200

## 2017-11-04 MED ORDER — DEXTROSE 5 % IV SOLN
INTRAVENOUS | Status: DC
  Administered 2017-11-04 – 2017-11-05 (×2): via INTRAVENOUS
  Filled 2017-11-04 (×2): qty 150

## 2017-11-04 MED ORDER — SODIUM CHLORIDE 0.9 % IV SOLN: INTRAVENOUS | Status: DC

## 2017-11-04 MED ORDER — FENTANYL CITRATE (PF) 250 MCG/5ML IJ SOLN
INTRAMUSCULAR | Status: DC | PRN
  Administered 2017-11-04: 100 ug via INTRAVENOUS

## 2017-11-04 MED ORDER — SODIUM CHLORIDE 0.9 % IV SOLN
INTRAVENOUS | Status: DC | PRN
  Administered 2017-11-04: 12 mL

## 2017-11-04 MED ORDER — PHENYLEPHRINE HCL 10 MG/ML IJ SOLN
INTRAMUSCULAR | Status: DC | PRN
  Administered 2017-11-04: 120 ug via INTRAVENOUS
  Administered 2017-11-04: 40 ug via INTRAVENOUS
  Administered 2017-11-04: 80 ug via INTRAVENOUS

## 2017-11-04 MED ORDER — LIDOCAINE HCL (CARDIAC) 20 MG/ML IV SOLN
INTRAVENOUS | Status: DC | PRN
  Administered 2017-11-04: 100 mg via INTRATRACHEAL

## 2017-11-04 MED ORDER — PROPOFOL 10 MG/ML IV BOLUS
INTRAVENOUS | Status: DC | PRN
  Administered 2017-11-04: 140 mg via INTRAVENOUS

## 2017-11-04 MED ORDER — SODIUM BICARBONATE 8.4 % IV SOLN
INTRAVENOUS | Status: DC
  Filled 2017-11-04: qty 1000

## 2017-11-04 MED ORDER — INDOMETHACIN 50 MG RE SUPP
RECTAL | Status: DC | PRN
  Administered 2017-11-04 (×2): 50 mg via RECTAL

## 2017-11-04 MED ORDER — ROCURONIUM BROMIDE 100 MG/10ML IV SOLN
INTRAVENOUS | Status: DC | PRN
  Administered 2017-11-04: 50 mg via INTRAVENOUS

## 2017-11-04 NOTE — Anesthesia Preprocedure Evaluation (Signed)
Anesthesia Evaluation  Patient identified by MRN, date of birth, ID band Patient awake    Reviewed: Allergy & Precautions, H&P , Patient's Chart, lab work & pertinent test results, reviewed documented beta blocker date and time   Airway Mallampati: II  TM Distance: >3 FB Neck ROM: full    Dental no notable dental hx.    Pulmonary    Pulmonary exam normal breath sounds clear to auscultation       Cardiovascular Exercise Tolerance: Good hypertension,  Rhythm:regular Rate:Normal     Neuro/Psych    GI/Hepatic   Endo/Other    Renal/GU Renal disease     Musculoskeletal   Abdominal   Peds  Hematology   Anesthesia Other Findings   Reproductive/Obstetrics                             Anesthesia Physical  Anesthesia Plan  ASA: III  Anesthesia Plan: General   Post-op Pain Management:    Induction: Intravenous  PONV Risk Score and Plan: 3 and Ondansetron, Dexamethasone and Treatment may vary due to age or medical condition  Airway Management Planned: Oral ETT  Additional Equipment:   Intra-op Plan:   Post-operative Plan: Extubation in OR  Informed Consent: I have reviewed the patients History and Physical, chart, labs and discussed the procedure including the risks, benefits and alternatives for the proposed anesthesia with the patient or authorized representative who has indicated his/her understanding and acceptance.   Dental Advisory Given  Plan Discussed with: CRNA  Anesthesia Plan Comments: (  )        Anesthesia Quick Evaluation

## 2017-11-04 NOTE — Anesthesia Postprocedure Evaluation (Signed)
Anesthesia Post Note  Patient: Lauren Richard  Procedure(s) Performed: ENDOSCOPIC RETROGRADE CHOLANGIOPANCREATOGRAPHY (ERCP) (N/A )     Anesthesia Post Evaluation  Last Vitals:  Vitals:   11/04/17 1155 11/04/17 1200  BP: (!) 133/46 107/78  Pulse: 67 65  Resp: 19 14  Temp:    SpO2: 99% 99%    Last Pain:  Vitals:   11/04/17 1143  TempSrc: Oral  PainSc:                  Nolon Nations

## 2017-11-04 NOTE — Anesthesia Procedure Notes (Signed)
Procedure Name: Intubation Date/Time: 11/04/2017 10:55 AM Performed by: Mariea Clonts, CRNA Pre-anesthesia Checklist: Patient identified, Emergency Drugs available, Suction available and Patient being monitored Patient Re-evaluated:Patient Re-evaluated prior to induction Oxygen Delivery Method: Circle System Utilized Preoxygenation: Pre-oxygenation with 100% oxygen Induction Type: IV induction Ventilation: Mask ventilation without difficulty Laryngoscope Size: Miller and 2 Grade View: Grade I Tube type: Oral Tube size: 7.0 mm Number of attempts: 1 Airway Equipment and Method: Stylet and Oral airway Placement Confirmation: ETT inserted through vocal cords under direct vision,  positive ETCO2 and breath sounds checked- equal and bilateral Tube secured with: Tape Dental Injury: Teeth and Oropharynx as per pre-operative assessment

## 2017-11-04 NOTE — Interval H&P Note (Signed)
History and Physical Interval Note:  11/04/2017 10:23 AM  Lauren Richard  has presented today for surgery, with the diagnosis of Biliary obstruction  The various methods of treatment have been discussed with the patient and family. After consideration of risks, benefits and other options for treatment, the patient has consented to  Procedure(s): ENDOSCOPIC RETROGRADE CHOLANGIOPANCREATOGRAPHY (ERCP) (N/A) as a surgical intervention .  The patient's history has been reviewed, patient examined, no change in status, stable for surgery.  I have reviewed the patient's chart and labs.  Questions were answered to the patient's satisfaction.     Avelyn Touch D

## 2017-11-04 NOTE — Op Note (Signed)
[facility] Patient Name: Lauren Richard Procedure Date : 11/04/2017 MRN: [id number] Attending MD: Carol Ada , MD Date of Birth: 27-Aug-1936 CSN: [id number] Age: 82 Admit Type: Inpatient Procedure:                ERCP Indications:              Malignant tumor of the head of pancreas Providers:                Carol Ada, MD, Burtis Junes, RN, Alan Mulder,                            Technician, Virgilio Belling. Beckner, CRNA Referring MD:              Medicines:                General Anesthesia Complications:            No immediate complications. Estimated Blood Loss:     Estimated blood loss: none. Procedure:                Pre-Anesthesia Assessment:                           - Prior to the procedure, a History and Physical                            was performed, and patient medications and                            allergies were reviewed. The patient's tolerance of                            previous anesthesia was also reviewed. The risks                            and benefits of the procedure and the sedation                            options and risks were discussed with the patient.                            All questions were answered, and informed consent                            was obtained. Prior Anticoagulants: The patient has                            taken heparin, last dose was 1 day prior to                            procedure. ASA Grade Assessment: II - A patient                            with mild systemic disease. After reviewing the  risks and benefits, the patient was deemed in                            satisfactory condition to undergo the procedure.                           - Sedation was administered by an anesthesia                            professional. Deep sedation was attained.                           After obtaining informed consent, the scope was                            passed under direct vision.  Throughout the                            procedure, the patient's blood pressure, pulse, and                            oxygen saturations were monitored continuously. The                            [id number] scope was introduced through the                            mouth, and used to inject contrast into and used to                            inject contrast into the bile duct. The ERCP was                            accomplished without difficulty. The patient                            tolerated the procedure well. Scope In: Scope Out: Findings:      The major papilla was normal. The bile duct was deeply cannulated with       the short-nosed traction sphincterotome. Contrast was injected. I       personally interpreted the bile duct images. There was brisk flow of       contrast through the ducts. Image quality was excellent. Contrast       extended to the main bile duct. Contrast extended to the bifurcation.       The lower third of the main bile duct contained a single segmental       stenosis 40 mm in length. The entire biliary tree was severely dilated       and diffusely dilated, with a mass causing an obstruction. The largest       diameter was 20 mm. A 0.035 inch straight standard wire was passed into       the biliary tree. A 15 mm biliary sphincterotomy was made with a       monofilament traction (standard) sphincterotome using ERBE  electrocautery. There was no post-sphincterotomy bleeding. One 10 Fr by       6 cm covered metal stent was placed 5 cm into the common bile duct. Bile       flowed through the stent. The stent was in good position.      In the long position the ampulla was cannulated during the first       attempt. The guidewire was secured in the left intrahepatic ducts.       Contrast injection revealed a diffusely dilated CBD at 20 mm and       intrahepatic ductal dilation. There was an abrupt cut off in the distal       CBD consistent with  the pancreatic head mass impingement. This length of       stenosis measured approximately 4 cm. The papilla was large and a long       1.5 cm sphincterotomy was created. Prior to cannulation there was some       mild spontaneous drainage of bile and more bile was released with       cannulation. A 10 mm x 40 mm stent was requested, but the the 10 mm x 60       mm stent was inadvertently given. Excellent stent placement was achieved       and there was brisk drainage of the biliary tract of contrast and bile. Impression:               - The major papilla appeared normal.                           - A segmental biliary stricture was found. The                            stricture was malignant appearing.                           - The entire biliary tree was severely dilated,                            with a mass causing an obstruction.                           - A biliary sphincterotomy was performed.                           - One covered metal stent was placed into the                            common bile duct. Recommendation:           - Return patient to hospital ward for ongoing care.                           - Resume regular diet.                           - EUS with FNA in one week as an outpatient. Procedure Code(s):        --- Professional ---  55974, Endoscopic retrograde                            cholangiopancreatography (ERCP); with placement of                            endoscopic stent into biliary or pancreatic duct,                            including pre- and post-dilation and guide wire                            passage, when performed, including sphincterotomy,                            when performed, each stent                           16384, Endoscopic catheterization of the biliary                            ductal system, radiological supervision and                            interpretation Diagnosis Code(s):        ---  Professional ---                           K83.1, Obstruction of bile duct                           C25.0, Malignant neoplasm of head of pancreas CPT copyright 2016 [employer]. All rights reserved. The codes documented in this report are preliminary and upon coder review may  be revised to meet current compliance requirements. Carol Ada, MD Carol Ada, MD 11/04/2017 11:42:33 AM This report has been signed electronically. Number of Addenda: 0

## 2017-11-04 NOTE — Progress Notes (Signed)
PROGRESS NOTE   Lauren Richard  ION:[id number]    DOB: 1936/08/16    DOA: 11/03/2017  PCP: Glendale Chard, MD   I have briefly reviewed patients previous medical records in [facility].  Brief Narrative:  82 year old female patient, lives alone, independent, PMH of HTN, HLD presented from PCPs office after an episode of syncope in office and jaundice.  Noted to have obstructive jaundice with pancreatic head mass on CT abdomen.  GI consulted and performed ERCP with biliary stent.  Acute kidney injury improving.   Assessment & Plan:   Active Problems:   Essential hypertension   Hyperlipidemia   Obesity   Acute kidney injury ([place])   Pancreatic mass   1. Pancreatic head mass with obstructive jaundice: Likely pancreatic cancer.  Presented with total bilirubin 19.7 and obstructive LFT picture.  GI/Dr. Benson Norway consulted and on 1/25 underwent ERCP which showed a segmental biliary stricture, malignant appearing.  The entire biliary tree was severely dilated with a mass causing an obstruction.  Biliary sphincterotomy was performed with and a metal stent was placed into the common bile duct.  I discussed with Dr. Almyra Free who recommends monitor overnight and if stable in a.m. DC home, outpatient follow-up with him in 1 week EUS with FNA.  Outpatient oncology consultation pending pathology results. 2. Acute on stage III chronic kidney disease: Baseline creatinine 1.4-1.7.  Presented with 3.15 on admission.  Most likely due to poor oral intake related to problem #1 and associated dehydration.  IV fluids.  Creatinine is improved to 2.16.  Continue IV fluids for additional day and follow-up BMP in a.m. 3. Hypomagnesemia: Replace and follow. 4. Tachycardia: Recently seen in ED for this and told to have A. fib and then followed up with EP cardiology on 1/14 >diagnosed not A. fib and Xarelto stopped.  She was continued on Cardizem for tachycardia.  Abnormal TFTs, some concern for hypothyroidism.  Hence  admitting physician changed Cardizem to beta blockers.  Telemetry: Sinus rhythm without arrhythmias.  Replace magnesium. 5. Abnormal TFTs/?  Hyperthyroid: TSH <0.010, free T3 3.5 and free T4: 1.73.  Clinically does not appear hyperthyroid.  Outpatient follow-up and may need endocrinology consultation. 6. Hyperlipidemia: Statins on hold due to abnormal LFTs. 7. Essential hypertension: Lisinopril and HCTZ held secondary to acute kidney injury.  Atenolol started.  Controlled. 8. Syncope: At PCPs office.  Suspect due to vasovagal versus hypotension/orthostatic hypotension.  Had similar episode in cardiologist office last week.  Telemetry without arrhythmias.  2D echo 1/18: LVEF 65-70%.  CT head without acute findings.  Patient advised no driving for 6 months. 9. Anemia: Likely related to malignancy.  Follow CBC in a.m.  10. Non-anion gap metabolic acidosis: Unclear etiology.  Changed IV fluids to bicarbonate drip.  Follow BMP in a.m.   DVT prophylaxis: Subcutaneous heparin Code Status: Full Family Communication: Discussed in detail with patient's sister at bedside. Disposition: DC home pending medical improvement, possibly 1/26.   Consultants:  GI/Dr. Benson Norway  Procedures:  ERCP, sphincterotomy and biliary stent 1/25  Antimicrobials:  None   Subjective: Seen this morning prior to procedure.  Felt much better.  Stronger.  Reports progressive decreased appetite, weakness, 20+ pounds weight loss since Christmas.  Sister has noted yellowish discoloration of eyes.  Patient reports pale stools and itching intermittently.  No abdominal pain, nausea or vomiting. States that she was sitting in PCPs office yesterday when she felt dizzy, lightheaded and passed out for a minute or 2 and did not sustain  any injuries.  No palpitations, chest pain reported.  ROS: As above.  Objective:  Vitals:   11/04/17 1145 11/04/17 1150 11/04/17 1155 11/04/17 1200  BP: 111/80 (!) 118/45 (!) 133/46 107/78  Pulse: 70  66 67 65  Resp: 18 18 19 14   Temp:      TempSrc:      SpO2: 100% 100% 99% 99%  Weight:      Height:        Examination:  General exam: Elderly female, moderately built and thinly nourished, lying comfortably supine in bed.  Oral mucosa with borderline hydration.  Scleral and skin icterus. Respiratory system: Clear to auscultation. Respiratory effort normal. Cardiovascular system: S1 & S2 heard, RRR. No JVD, murmurs, rubs, gallops or clicks. No pedal edema.  Telemetry: Sinus rhythm. Gastrointestinal system: Abdomen is nondistended, soft and nontender. No organomegaly or masses felt. Normal bowel sounds heard. Central nervous system: Alert and oriented. No focal neurological deficits. Extremities: Symmetric 5 x 5 power. Skin: No rashes, lesions or ulcers Psychiatry: Judgement and insight appear normal. Mood & affect appropriate.     Data Reviewed: I have personally reviewed following labs and imaging studies  CBC: Recent Labs  Lab 11/03/17 1348 11/04/17 0705  WBC 7.1 5.3  NEUTROABS 5.2  --   HGB 10.9* 9.6*  HCT 31.2* 28.1*  MCV 83.2 84.1  PLT 370 761   Basic Metabolic Panel: Recent Labs  Lab 11/03/17 1348 11/04/17 0705 11/04/17 0717  NA 134* 137  --   K 4.0 3.5  --   CL 103 111  --   CO2 16* 15*  --   GLUCOSE 111* 93  --   BUN 56* 47*  --   CREATININE 3.15* 2.16*  --   CALCIUM 9.6 8.7*  --   MG 1.5*  --  1.2*   Liver Function Tests: Recent Labs  Lab 11/03/17 1348 11/04/17 0705  AST 196* 163*  ALT 179* 143*  ALKPHOS 834* 704*  BILITOT 19.7* 16.6*  PROT 8.4* 6.9  ALBUMIN 2.7* 2.3*   Coagulation Profile: Recent Labs  Lab 11/03/17 1348 11/04/17 0705  INR 1.36 1.39   Cardiac Enzymes: No results for input(s): CKTOTAL, CKMB, CKMBINDEX, TROPONINI in the last 168 hours. HbA1C: No results for input(s): HGBA1C in the last 72 hours. CBG: Recent Labs  Lab 11/03/17 1955  GLUCAP 109*    No results found for this or any previous visit (from the past 240  hour(s)).       Radiology Studies: Ct Abdomen Pelvis Wo Contrast  Result Date: 11/03/2017 CLINICAL DATA:  82 year old female with history of syncope. EXAM: CT ABDOMEN AND PELVIS WITHOUT CONTRAST TECHNIQUE: Multidetector CT imaging of the abdomen and pelvis was performed following the standard protocol without IV contrast. COMPARISON:  No priors. FINDINGS: Lower chest: Mild scarring in the right lung base. Atherosclerotic calcifications in the left anterior descending, left circumflex and right coronary arteries. Small hiatal hernia. Hepatobiliary: No definite cystic or solid hepatic lesions are confidently identified on today's noncontrast CT examination. Although today's study is a noncontrast CT examination, there is moderate to severe intrahepatic biliary ductal dilatation. Common bile duct is markedly dilated measuring 18 mm in the porta hepatis. This abruptly terminates shortly above the expected location of the ampulla. Gallbladder is moderately distended, without evidence of gallstones. Intermediate attenuation material lying dependently in the gallbladder likely reflects biliary sludge. No surrounding inflammatory changes to suggest an acute cholecystitis at this time. Pancreas: In the head of the pancreas  immediately below the level of common bile duct truncation (axial image 37 of series 3 and coronal image 50 of series 6) there is a mass-like area measuring 2.1 x 2.3 x 3.1 cm which is slightly higher attenuation than the remainder of the pancreatic parenchyma and demonstrates no internal fatty marbling as is seen in the remainder of the pancreatic parenchyma, concerning for primary pancreatic neoplasm. The body and tail of the pancreas are grossly normal in appearance. No pancreatic or peripancreatic fluid or inflammatory changes. Spleen: Unremarkable. Adrenals/Urinary Tract: Unenhanced appearance of the right kidney and left adrenal gland is normal. 1.3 cm low-attenuation lesion in the posterior  aspect of the interpolar region of the left kidney is incompletely characterized on today's noncontrast CT examination, but is statistically likely a cyst. 1.6 cm low-attenuation (-3 HU) right adrenal nodule is compatible with an adenoma. No hydroureteronephrosis. Urinary bladder is normal in appearance. Stomach/Bowel: Normal appearance of the stomach. No pathologic dilatation of small bowel or colon. The appendix is not confidently identified and may be surgically absent. Regardless, there are no inflammatory changes noted adjacent to the cecum to suggest the presence of an acute appendicitis at this time. Vascular/Lymphatic: Aortic atherosclerosis, without evidence of aneurysm or dissection in the abdominal or pelvic vasculature. No lymphadenopathy noted in the abdomen or pelvis. Reproductive: Status post hysterectomy. Ovaries are not confidently identified may be surgically absent or atrophic. Other: No significant volume of ascites.  No pneumoperitoneum. Musculoskeletal: There are no aggressive appearing lytic or blastic lesions noted in the visualized portions of the skeleton. IMPRESSION: 1. Findings are concerning for common bile duct obstruction, potentially related to a primary pancreatic neoplasm, as detailed above. At this time this is associated with moderate to severe intra and extrahepatic biliary ductal dilatation. Further evaluation with MRI of the abdomen with and without IV gadolinium with MRCP should be considered in the near future to better evaluate these findings. Alternatively, if the patient is not likely to be able to adequately hold her breath during an MRI examination, pancreatic protocol CT scan would be recommended. 2. Biliary sludge in the gallbladder. No findings to suggest an acute cholecystitis at this time. 3. Small right adrenal adenoma incidentally noted. 4. Aortic atherosclerosis, in addition to 3 vessel coronary artery disease. Assessment for potential risk factor modification,  dietary therapy or pharmacologic therapy may be warranted, if clinically indicated. Aortic Atherosclerosis (ICD10-I70.0). Electronically Signed   By: Vinnie Langton M.D.   On: 11/03/2017 16:08   Ct Head Wo Contrast  Result Date: 11/03/2017 CLINICAL DATA:  Syncope lasting between 5 and 30 seconds. EXAM: CT HEAD WITHOUT CONTRAST TECHNIQUE: Contiguous axial images were obtained from the base of the skull through the vertex without intravenous contrast. COMPARISON:  None. FINDINGS: Brain: No evidence of acute infarction, hemorrhage, hydrocephalus, extra-axial collection or mass lesion/mass effect. Vascular: Atherosclerotic calcification.  No hyperdense vessel. Skull: No acute or aggressive finding Sinuses/Orbits: Negative IMPRESSION: 1. Age normal appearance of the brain. 2. Atherosclerosis. Electronically Signed   By: Monte Fantasia M.D.   On: 11/03/2017 16:00   Dg Ercp Biliary & Pancreatic Ducts  Result Date: 11/04/2017 CLINICAL DATA:  Common bile duct calculus EXAM: ERCP TECHNIQUE: Multiple spot images obtained with the fluoroscopic device and submitted for interpretation post-procedure. FLUOROSCOPY TIME:  Fluoroscopy Time:  1 minutes and 41 seconds Radiation Exposure Index (if provided by the fluoroscopic device): Number of Acquired Spot Images: 0 COMPARISON:  None. FINDINGS: Imaging demonstrates cannulation of the common bile duct. The common bile  duct is dilated. The inferior common bile duct is narrowed. Final image demonstrates placement of a metallic stent across the area of narrowing into the duodenum. IMPRESSION: Common bile duct stent placement. These images were submitted for radiologic interpretation only. Please see the procedural report for the amount of contrast and the fluoroscopy time utilized. Electronically Signed   By: Marybelle Killings M.D.   On: 11/04/2017 14:02        Scheduled Meds: . amLODipine  5 mg Oral Daily  . aspirin EC  81 mg Oral Daily  . atenolol  25 mg Oral Daily  .  cholecalciferol  2,000 Units Oral Daily  . heparin  5,000 Units Subcutaneous Q8H   Continuous Infusions: . sodium chloride 1,000 mL (11/04/17 0615)     LOS: 1 day     Vernell Leep, MD, FACP, [facility]. [facility] Pager [phone]  If 7PM-7AM, please contact night-coverage [email or web address] Password [facility] 11/04/2017, 5:03 PM

## 2017-11-04 NOTE — Transfer of Care (Signed)
Immediate Anesthesia Transfer of Care Note  Patient: Lauren Richard  Procedure(s) Performed: ENDOSCOPIC RETROGRADE CHOLANGIOPANCREATOGRAPHY (ERCP) (N/A )  Patient Location: Endoscopy Unit  Anesthesia Type:General  Level of Consciousness: awake, alert  and oriented  Airway & Oxygen Therapy: Patient Spontanous Breathing and Patient connected to nasal cannula oxygen  Post-op Assessment: Report given to RN, Post -op Vital signs reviewed and stable and Patient moving all extremities X 4  Post vital signs: Reviewed and stable  Last Vitals:  Vitals:   11/04/17 0809 11/04/17 0945  BP: (!) 116/56 (!) 137/52  Pulse: 67 65  Resp: 18 (!) 27  Temp: 36.6 C 36.7 C  SpO2:  97%    Last Pain:  Vitals:   11/04/17 0945  TempSrc: Oral  PainSc:          Complications: No apparent anesthesia complications

## 2017-11-05 DIAGNOSIS — N189 Chronic kidney disease, unspecified: Secondary | ICD-10-CM

## 2017-11-05 DIAGNOSIS — E876 Hypokalemia: Secondary | ICD-10-CM

## 2017-11-05 DIAGNOSIS — D649 Anemia, unspecified: Secondary | ICD-10-CM

## 2017-11-05 DIAGNOSIS — K831 Obstruction of bile duct: Secondary | ICD-10-CM

## 2017-11-05 DIAGNOSIS — N179 Acute kidney failure, unspecified: Secondary | ICD-10-CM

## 2017-11-05 LAB — COMPREHENSIVE METABOLIC PANEL
ALBUMIN: 2.1 g/dL — AB (ref 3.5–5.0)
ALK PHOS: 681 U/L — AB (ref 38–126)
ALT: 148 U/L — ABNORMAL HIGH (ref 14–54)
AST: 176 U/L — AB (ref 15–41)
Anion gap: 11 (ref 5–15)
BILIRUBIN TOTAL: 10.9 mg/dL — AB (ref 0.3–1.2)
BUN: 27 mg/dL — ABNORMAL HIGH (ref 6–20)
CHLORIDE: 109 mmol/L (ref 101–111)
CO2: 20 mmol/L — AB (ref 22–32)
CREATININE: 1.84 mg/dL — AB (ref 0.44–1.00)
Calcium: 8.7 mg/dL — ABNORMAL LOW (ref 8.9–10.3)
GFR calc Af Amer: 29 mL/min — ABNORMAL LOW (ref >60)
GFR, EST NON AFRICAN AMERICAN: 25 mL/min — AB (ref >60)
Glucose, Bld: 106 mg/dL — ABNORMAL HIGH (ref 65–99)
Potassium: 3 mmol/L — ABNORMAL LOW (ref 3.5–5.1)
Sodium: 140 mmol/L (ref 135–145)
Total Protein: 6.6 g/dL (ref 6.5–8.1)

## 2017-11-05 LAB — CBC
HEMATOCRIT: 27.6 % — AB (ref 36.0–46.0)
HEMOGLOBIN: 9.6 g/dL — AB (ref 12.0–15.0)
MCH: 29.1 pg (ref 26.0–34.0)
MCHC: 34.8 g/dL (ref 30.0–36.0)
MCV: 83.6 fL (ref 78.0–100.0)
Platelets: 333 10*3/uL (ref 150–400)
RBC: 3.3 MIL/uL — AB (ref 3.87–5.11)
RDW: 14.9 % (ref 11.5–15.5)
WBC: 5.4 10*3/uL (ref 4.0–10.5)

## 2017-11-05 LAB — MAGNESIUM: Magnesium: 1.8 mg/dL (ref 1.7–2.4)

## 2017-11-05 MED ORDER — POTASSIUM CHLORIDE CRYS ER 20 MEQ PO TBCR
40.0000 meq | EXTENDED_RELEASE_TABLET | ORAL | Status: AC
Start: 2017-11-05 — End: 2017-11-05
  Administered 2017-11-05 (×2): 40 meq via ORAL
  Filled 2017-11-05 (×2): qty 2

## 2017-11-05 MED ORDER — SODIUM CHLORIDE 0.9 % IV SOLN
INTRAVENOUS | Status: AC
  Administered 2017-11-05 (×2): via INTRAVENOUS

## 2017-11-05 NOTE — Progress Notes (Signed)
PROGRESS NOTE   Lauren Richard  VVO:[id number]    DOB: 04/16/1936    DOA: 11/03/2017  PCP: Glendale Chard, MD   I have briefly reviewed patients previous medical records in [facility].  Brief Narrative:  83 year old female patient, lives alone, independent, PMH of HTN, HLD presented from PCPs office after an episode of syncope in office and jaundice.  Noted to have obstructive jaundice with pancreatic head mass on CT abdomen.  GI consulted and performed ERCP with biliary stent.  Acute kidney injury improving.   Assessment & Plan:   Active Problems:   Essential hypertension   Hyperlipidemia   Obesity   Acute kidney injury ([place])   Pancreatic mass   1. Pancreatic head mass with obstructive jaundice: Likely pancreatic cancer.  Presented with total bilirubin 19.7 and obstructive LFT picture.  GI/Dr. Benson Norway consulted and on 1/25 underwent ERCP which showed a segmental biliary stricture, malignant appearing.  The entire biliary tree was severely dilated with a mass causing an obstruction.  Biliary sphincterotomy was performed with and a metal stent was placed into the common bile duct.  I discussed with Dr. Benson Norway on 1/25 who recommends monitor overnight and if stable in a.m. DC home, outpatient follow-up with him in 1 week EUS with FNA.  Outpatient oncology consultation pending pathology results.  Tolerating diet without difficulty.  LFTs improving.  Will manage in the hospital for additional day due to problem #2. 2. Acute on stage III chronic kidney disease: Presented with 3.15 on admission.  Most likely due to poor oral intake related to problem #1 and associated dehydration.  IV fluids.  Creatinine is improved to 1.84.  Although initially baseline creatinine was felt to be between 1.4-1.7, upon reviewing records carefully today, she had normal creatinine in July 2018.  Continue IV fluids for additional day and expect further recovery. 3. Hypomagnesemia: Replaced 4. Hypokalemia: Replace  aggressively and follow. 5. Tachycardia: Recently seen in ED for this and told to have A. fib and then followed up with EP cardiology on 1/14 >diagnosed not A. fib and Xarelto stopped.  She was continued on Cardizem for tachycardia.  Abnormal TFTs, some concern for hyperthyroidism.  Hence admitting physician changed Cardizem to beta blockers.  Telemetry: Sinus rhythm without arrhythmias.  Replaced magnesium.  No arrhythmias noted. 6. Abnormal TFTs/?  Hyperthyroid: TSH <0.010, free T3 3.5 and free T4: 1.73.  Clinically does not appear hyperthyroid.  Outpatient follow-up and may need endocrinology consultation. 7. Hyperlipidemia: Statins on hold due to abnormal LFTs. 8. Essential hypertension: Lisinopril and HCTZ held secondary to acute kidney injury.  Atenolol started.  Controlled. 9. Syncope: At PCPs office.  Suspect due to vasovagal versus hypotension/orthostatic hypotension.  Had similar episode in cardiologist office last week.  Telemetry without arrhythmias.  2D echo 1/18: LVEF 65-70%.  CT head without acute findings.  Patient advised no driving for 6 months.  Ambulated hall without difficulty. 10. Anemia: Likely related to malignancy.  Stable. 11. Non-anion gap metabolic acidosis: Unclear etiology.  Improved after bicarbonate drip.  DVT prophylaxis: Subcutaneous heparin Code Status: Full Family Communication: None at bedside. Disposition: DC home pending medical improvement, possibly 1/27.   Consultants:  GI/Dr. Benson Norway  Procedures:  ERCP, sphincterotomy and biliary stent 1/25  Antimicrobials:  None   Subjective: Feels much better.  Stronger.  Tolerated breakfast without nausea, vomiting or abdominal pain.  Excellent urine output.  No dyspnea.  As per RN, ambulated hall without difficulty.  ROS: As above.  Objective:  Vitals:   11/04/17 1200 11/04/17 1709 11/04/17 2234 11/05/17 0450  BP: 107/78 122/62 (!) 116/52 (!) 120/44  Pulse: 65 67 68 66  Resp: 14 18 18 16   Temp:  97.9 F  (36.6 C) 98 F (36.7 C) (!) 97.5 F (36.4 C)  TempSrc:  Oral Oral Oral  SpO2: 99%  100% 100%  Weight:      Height:        Examination:  General exam: Elderly female, moderately built and thinly nourished, lying comfortably supine in bed.  Oral mucosa moist.  Scleral and skin icterus decreasing. Respiratory system: Clear to auscultation. Respiratory effort normal.  Stable. Cardiovascular system: S1 & S2 heard, RRR. No JVD, murmurs, rubs, gallops or clicks. No pedal edema.  Telemetry personally reviewed: Sinus rhythm.  Stable without change. Gastrointestinal system: Abdomen is nondistended, soft and nontender. No organomegaly or masses felt. Normal bowel sounds heard.  Stable without change. Central nervous system: Alert and oriented. No focal neurological deficits. Extremities: Symmetric 5 x 5 power. Skin: No rashes, lesions or ulcers Psychiatry: Judgement and insight appear normal. Mood & affect appropriate.     Data Reviewed: I have personally reviewed following labs and imaging studies  CBC: Recent Labs  Lab 11/03/17 1348 11/04/17 0705 11/05/17 0504  WBC 7.1 5.3 5.4  NEUTROABS 5.2  --   --   HGB 10.9* 9.6* 9.6*  HCT 31.2* 28.1* 27.6*  MCV 83.2 84.1 83.6  PLT 370 336 947   Basic Metabolic Panel: Recent Labs  Lab 11/03/17 1348 11/04/17 0705 11/04/17 0717 11/05/17 0504  NA 134* 137  --  140  K 4.0 3.5  --  3.0*  CL 103 111  --  109  CO2 16* 15*  --  20*  GLUCOSE 111* 93  --  106*  BUN 56* 47*  --  27*  CREATININE 3.15* 2.16*  --  1.84*  CALCIUM 9.6 8.7*  --  8.7*  MG 1.5*  --  1.2* 1.8   Liver Function Tests: Recent Labs  Lab 11/03/17 1348 11/04/17 0705 11/05/17 0504  AST 196* 163* 176*  ALT 179* 143* 148*  ALKPHOS 834* 704* 681*  BILITOT 19.7* 16.6* 10.9*  PROT 8.4* 6.9 6.6  ALBUMIN 2.7* 2.3* 2.1*   Coagulation Profile: Recent Labs  Lab 11/03/17 1348 11/04/17 0705  INR 1.36 1.39   Cardiac Enzymes: No results for input(s): CKTOTAL, CKMB,  CKMBINDEX, TROPONINI in the last 168 hours. HbA1C: No results for input(s): HGBA1C in the last 72 hours. CBG: Recent Labs  Lab 11/03/17 1955  GLUCAP 109*    No results found for this or any previous visit (from the past 240 hour(s)).       Radiology Studies: Dg Ercp Biliary & Pancreatic Ducts  Result Date: 11/04/2017 CLINICAL DATA:  Common bile duct calculus EXAM: ERCP TECHNIQUE: Multiple spot images obtained with the fluoroscopic device and submitted for interpretation post-procedure. FLUOROSCOPY TIME:  Fluoroscopy Time:  1 minutes and 41 seconds Radiation Exposure Index (if provided by the fluoroscopic device): Number of Acquired Spot Images: 0 COMPARISON:  None. FINDINGS: Imaging demonstrates cannulation of the common bile duct. The common bile duct is dilated. The inferior common bile duct is narrowed. Final image demonstrates placement of a metallic stent across the area of narrowing into the duodenum. IMPRESSION: Common bile duct stent placement. These images were submitted for radiologic interpretation only. Please see the procedural report for the amount of contrast and the fluoroscopy time utilized. Electronically Signed   By:  Marybelle Killings M.D.   On: 11/04/2017 14:02        Scheduled Meds: . amLODipine  5 mg Oral Daily  . aspirin EC  81 mg Oral Daily  . atenolol  25 mg Oral Daily  . cholecalciferol  2,000 Units Oral Daily  . heparin  5,000 Units Subcutaneous Q8H   Continuous Infusions: . sodium chloride 75 mL/hr at 11/05/17 0807     LOS: 2 days     Vernell Leep, MD, FACP, [facility]. [facility] Pager [phone]  If 7PM-7AM, please contact night-coverage [email or web address] Password [facility] 11/05/2017, 4:08 PM

## 2017-11-06 ENCOUNTER — Encounter (HOSPITAL_COMMUNITY): Payer: Self-pay | Admitting: Gastroenterology

## 2017-11-06 LAB — COMPREHENSIVE METABOLIC PANEL
ALK PHOS: 665 U/L — AB (ref 38–126)
ALT: 134 U/L — AB (ref 14–54)
AST: 149 U/L — ABNORMAL HIGH (ref 15–41)
Albumin: 2.1 g/dL — ABNORMAL LOW (ref 3.5–5.0)
Anion gap: 11 (ref 5–15)
BUN: 21 mg/dL — ABNORMAL HIGH (ref 6–20)
CALCIUM: 8.5 mg/dL — AB (ref 8.9–10.3)
CHLORIDE: 109 mmol/L (ref 101–111)
CO2: 20 mmol/L — ABNORMAL LOW (ref 22–32)
CREATININE: 1.28 mg/dL — AB (ref 0.44–1.00)
GFR, EST AFRICAN AMERICAN: 45 mL/min — AB (ref >60)
GFR, EST NON AFRICAN AMERICAN: 39 mL/min — AB (ref >60)
Glucose, Bld: 88 mg/dL (ref 65–99)
Potassium: 3.2 mmol/L — ABNORMAL LOW (ref 3.5–5.1)
Sodium: 140 mmol/L (ref 135–145)
Total Bilirubin: 6.9 mg/dL — ABNORMAL HIGH (ref 0.3–1.2)
Total Protein: 6.3 g/dL — ABNORMAL LOW (ref 6.5–8.1)

## 2017-11-06 MED ORDER — POTASSIUM CHLORIDE CRYS ER 20 MEQ PO TBCR
40.0000 meq | EXTENDED_RELEASE_TABLET | ORAL | Status: AC
  Administered 2017-11-06 (×2): 40 meq via ORAL
  Filled 2017-11-06 (×2): qty 2

## 2017-11-06 MED ORDER — ATENOLOL 25 MG PO TABS: 25.0000 mg | ORAL_TABLET | Freq: Every day | ORAL | 0 refills | Status: DC

## 2017-11-06 NOTE — Discharge Instructions (Addendum)

## 2017-11-06 NOTE — Progress Notes (Signed)
Patient discharged to home with family. IV removed. Discharge instructions reviewed with the patient. Medications and followup appts reviewed with patient. All belongings in tow, patient left unit in stable condition.  Sheliah Plane RN

## 2017-11-06 NOTE — Discharge Summary (Signed)
Physician Discharge Summary  Lauren Richard CHY:[id number] DOB: [birth year]  PCP: Glendale Chard, MD  Admit date: 11/03/2017 Discharge date: 11/06/2017  Recommendations for Outpatient Follow-up:  1. Dr. Glendale Chard, PCP in 3 days with repeat labs (CBC, CMP & magnesium). 2. Recommend outpatient Endocrinology consultation to evaluate for possible hyperthyroidism/abnormal TFTs. 3. Dr. Carol Ada, GI in 1 week for EUS and biopsy on pancreatic head mass.  Patient advised to call office tomorrow 11/07/17.  Home Health: None Equipment/Devices: None  Discharge Condition: Improved and stable CODE STATUS: Full Diet recommendation: Heart healthy diet.  Discharge Diagnoses:  Active Problems:   Essential hypertension   Hyperlipidemia   Obesity   Acute kidney injury ([place])   Pancreatic mass   Acute kidney injury superimposed on chronic kidney disease (HCC)   Hypokalemia   Anemia   Biliary obstruction   Brief Summary: 82 year old female patient, lives alone, independent, PMH of HTN, HLD presented from PCPs office after an episode of syncope in office and jaundice.  Noted to have obstructive jaundice with pancreatic head mass on CT abdomen.  GI consulted and performed ERCP with biliary stent.   Assessment & Plan:   1. Pancreatic head mass with obstructive jaundice: Likely pancreatic cancer.  Presented with total bilirubin 19.7 and obstructive LFT picture.  GI/Dr. Benson Norway consulted and on 1/25 underwent ERCP which showed a segmental biliary stricture, malignant appearing.  The entire biliary tree was severely dilated with a mass causing an obstruction.  Biliary sphincterotomy was performed and a metal stent was placed into the common bile duct.  Clinically improved and LFTs continued to steadily improve as well.  As discussed with GI, DC home, outpatient follow-up with Dr. Benson Norway in 1 week for EUS with FNA. Outpatient oncology consultation pending pathology results.  2. Acute on stage III chronic  kidney disease: Presented with Creatinine 3.15 on admission.  Most likely due to poor oral intake related to problem #1 and associated dehydration. Although initially baseline creatinine was felt to be between 1.4-1.7, upon reviewing records carefully, she had normal creatinine in July 2018.    She was hydrated for a couple days with IV fluids.  Creatinine has steadily improved and down to 1.28.  Encouraged ad lib. oral fluid intake at home and expect creatinine to normalize during outpatient follow-up.  Follow BMP in a couple days. 3. Hypomagnesemia: Replaced 4. Hypokalemia:  Replaced aggressively prior to discharge.  Continue home potassium supplements.  Patient consumes potassium rich diet at home.  Follow-up BMP in a couple days as outpatient. 5. Tachycardia: Recently seen in ED for this and told to have A. fib and then followed up with EP cardiology on 1/14 >diagnosed NOT A. fib and Xarelto stopped.  She was continued on Cardizem for tachycardia.  Abnormal TFTs, some concern for hyperthyroidism.  Hence admitting physician changed Cardizem to beta blockers.  Telemetry: Sinus rhythm without arrhythmias.  Replaced magnesium.  No arrhythmias noted.  Patient may have stopped taking Cardizem PTA.  Continue atenolol at discharge. 6. Abnormal TFTs/?  Hyperthyroid: TSH <0.010, free T3 3.5 and free T4: 1.73.  Clinically does not appear hyperthyroid.  Outpatient follow-up and may need Endocrinology consultation. 7. Hyperlipidemia: Statins on hold due to abnormal LFTs. 8. Essential hypertension: Lisinopril and HCTZ held secondary to acute kidney injury.  Atenolol started.  Continue amlodipine. Controlled. 9. Syncope: At PCPs office.  Suspect due to vasovagal versus hypotension/orthostatic hypotension.  Had similar episode in Cardiologist office last week.  Telemetry without arrhythmias.  2D echo  1/18: LVEF 65-70%.  CT head without acute findings.  Patient advised no driving for 6 months (patient states that she does  not drive).  Ambulated hall without difficulty. 10. Anemia: Likely related to malignancy.  Stable. 11. Non-anion gap metabolic acidosis: Unclear etiology.  Improved after bicarbonate drip.  Consultants:  GI/Dr. Benson Norway  Procedures:  ERCP, sphincterotomy and biliary stent 1/25   Discharge Instructions  Discharge Instructions    Call MD for:   Complete by:  As directed    Passing out.   Call MD for:  extreme fatigue   Complete by:  As directed    Call MD for:  persistant dizziness or light-headedness   Complete by:  As directed    Diet - low sodium heart healthy   Complete by:  As directed    Driving Restrictions   Complete by:  As directed    No driving for 6 months.   Increase activity slowly   Complete by:  As directed        Medication List    STOP taking these medications   atorvastatin 20 MG tablet Commonly known as:  LIPITOR   diltiazem 180 MG 24 hr capsule Commonly known as:  CARDIZEM CD   losartan-hydrochlorothiazide 100-25 MG tablet Commonly known as:  HYZAAR   methocarbamol 500 MG tablet Commonly known as:  ROBAXIN   Rivaroxaban 15 MG Tabs tablet Commonly known as:  XARELTO   traMADol 50 MG tablet Commonly known as:  ULTRAM     TAKE these medications   acetaminophen 500 MG tablet Commonly known as:  TYLENOL Take 1,000 mg by mouth daily as needed.   amLODipine 5 MG tablet Commonly known as:  NORVASC Take 5 mg by mouth daily.   aspirin 81 MG tablet Take 81 mg by mouth daily.   atenolol 25 MG tablet Commonly known as:  TENORMIN Take 1 tablet (25 mg total) by mouth daily. Start taking on:  11/07/2017   lidocaine-prilocaine cream Commonly known as:  EMLA Apply 1 application topically as needed (BACK PAIN).   potassium chloride 10 MEQ tablet Commonly known as:  K-DUR Take 10 mEq by mouth daily.   Vitamin D 2000 units tablet Take 2,000 Units by mouth daily.      Follow-up Information    Glendale Chard, MD. Schedule an appointment as  soon as possible for a visit in 3 day(s).   Specialty:  Internal Medicine Why:  To be seen with repeat labs (CBC, CMP & Mg). Contact information: [address]  [place] [address] [phone]        Carol Ada, MD. Schedule an appointment as soon as possible for a visit in 1 week(s).   Specialty:  Gastroenterology Why:  Please call office tomorrow 11/07/17 with regarding follow-up for further procedures. Contact information: [place], [place] [address] [phone]          No Known Allergies    Procedures/Studies: Ct Abdomen Pelvis Wo Contrast  Result Date: 11/03/2017 CLINICAL DATA:  82 year old female with history of syncope. EXAM: CT ABDOMEN AND PELVIS WITHOUT CONTRAST TECHNIQUE: Multidetector CT imaging of the abdomen and pelvis was performed following the standard protocol without IV contrast. COMPARISON:  No priors. FINDINGS: Lower chest: Mild scarring in the right lung base. Atherosclerotic calcifications in the left anterior descending, left circumflex and right coronary arteries. Small hiatal hernia. Hepatobiliary: No definite cystic or solid hepatic lesions are confidently identified on today's noncontrast CT examination. Although today's study is  a noncontrast CT examination, there is moderate to severe intrahepatic biliary ductal dilatation. Common bile duct is markedly dilated measuring 18 mm in the porta hepatis. This abruptly terminates shortly above the expected location of the ampulla. Gallbladder is moderately distended, without evidence of gallstones. Intermediate attenuation material lying dependently in the gallbladder likely reflects biliary sludge. No surrounding inflammatory changes to suggest an acute cholecystitis at this time. Pancreas: In the head of the pancreas immediately below the level of common bile duct truncation (axial image 37 of series 3 and coronal image 50 of series 6) there is a mass-like area measuring  2.1 x 2.3 x 3.1 cm which is slightly higher attenuation than the remainder of the pancreatic parenchyma and demonstrates no internal fatty marbling as is seen in the remainder of the pancreatic parenchyma, concerning for primary pancreatic neoplasm. The body and tail of the pancreas are grossly normal in appearance. No pancreatic or peripancreatic fluid or inflammatory changes. Spleen: Unremarkable. Adrenals/Urinary Tract: Unenhanced appearance of the right kidney and left adrenal gland is normal. 1.3 cm low-attenuation lesion in the posterior aspect of the interpolar region of the left kidney is incompletely characterized on today's noncontrast CT examination, but is statistically likely a cyst. 1.6 cm low-attenuation (-3 HU) right adrenal nodule is compatible with an adenoma. No hydroureteronephrosis. Urinary bladder is normal in appearance. Stomach/Bowel: Normal appearance of the stomach. No pathologic dilatation of small bowel or colon. The appendix is not confidently identified and may be surgically absent. Regardless, there are no inflammatory changes noted adjacent to the cecum to suggest the presence of an acute appendicitis at this time. Vascular/Lymphatic: Aortic atherosclerosis, without evidence of aneurysm or dissection in the abdominal or pelvic vasculature. No lymphadenopathy noted in the abdomen or pelvis. Reproductive: Status post hysterectomy. Ovaries are not confidently identified may be surgically absent or atrophic. Other: No significant volume of ascites.  No pneumoperitoneum. Musculoskeletal: There are no aggressive appearing lytic or blastic lesions noted in the visualized portions of the skeleton. IMPRESSION: 1. Findings are concerning for common bile duct obstruction, potentially related to a primary pancreatic neoplasm, as detailed above. At this time this is associated with moderate to severe intra and extrahepatic biliary ductal dilatation. Further evaluation with MRI of the abdomen with  and without IV gadolinium with MRCP should be considered in the near future to better evaluate these findings. Alternatively, if the patient is not likely to be able to adequately hold her breath during an MRI examination, pancreatic protocol CT scan would be recommended. 2. Biliary sludge in the gallbladder. No findings to suggest an acute cholecystitis at this time. 3. Small right adrenal adenoma incidentally noted. 4. Aortic atherosclerosis, in addition to 3 vessel coronary artery disease. Assessment for potential risk factor modification, dietary therapy or pharmacologic therapy may be warranted, if clinically indicated. Aortic Atherosclerosis (ICD10-I70.0). Electronically Signed   By: Vinnie Langton M.D.   On: 11/03/2017 16:08   Ct Head Wo Contrast  Result Date: 11/03/2017 CLINICAL DATA:  Syncope lasting between 5 and 30 seconds. EXAM: CT HEAD WITHOUT CONTRAST TECHNIQUE: Contiguous axial images were obtained from the base of the skull through the vertex without intravenous contrast. COMPARISON:  None. FINDINGS: Brain: No evidence of acute infarction, hemorrhage, hydrocephalus, extra-axial collection or mass lesion/mass effect. Vascular: Atherosclerotic calcification.  No hyperdense vessel. Skull: No acute or aggressive finding Sinuses/Orbits: Negative IMPRESSION: 1. Age normal appearance of the brain. 2. Atherosclerosis. Electronically Signed   By: Monte Fantasia M.D.   On: 11/03/2017 16:00  Dg Ercp Biliary & Pancreatic Ducts  Result Date: 11/04/2017 CLINICAL DATA:  Common bile duct calculus EXAM: ERCP TECHNIQUE: Multiple spot images obtained with the fluoroscopic device and submitted for interpretation post-procedure. FLUOROSCOPY TIME:  Fluoroscopy Time:  1 minutes and 41 seconds Radiation Exposure Index (if provided by the fluoroscopic device): Number of Acquired Spot Images: 0 COMPARISON:  None. FINDINGS: Imaging demonstrates cannulation of the common bile duct. The common bile duct is dilated.  The inferior common bile duct is narrowed. Final image demonstrates placement of a metallic stent across the area of narrowing into the duodenum. IMPRESSION: Common bile duct stent placement. These images were submitted for radiologic interpretation only. Please see the procedural report for the amount of contrast and the fluoroscopy time utilized. Electronically Signed   By: Marybelle Killings M.D.   On: 11/04/2017 14:02      Subjective: Denies complaints.  Eager to go home.  Tolerating diet without symptoms.  No abdominal pain, nausea or vomiting.  Ambulating without dizziness, lightheadedness or feeling like passing out.  No chest pain, dyspnea or palpitations.  Had 2 BMs in the last 24 hours.  Discharge Exam:  Vitals:   11/05/17 0450 11/05/17 2031 11/06/17 0418 11/06/17 0937  BP: (!) 120/44 118/65 (!) 121/56 (!) 117/50  Pulse: 66 72 68 79  Resp: 16 17 16 16   Temp: (!) 97.5 F (36.4 C) 98 F (36.7 C) 98 F (36.7 C) 98.1 F (36.7 C)  TempSrc: Oral Oral Oral Oral  SpO2: 100%  97% 100%  Weight:  89.9 kg (198 lb 3.2 oz)    Height:  5\' 7"  (1.702 m)      General exam: Elderly female, moderately built and thinly nourished, lying comfortably supine in bed.  Oral mucosa moist.  Scleral and skin icterus progressively decreasing. Respiratory system: Clear to auscultation. Respiratory effort normal.   Cardiovascular system: S1 & S2 heard, RRR. No JVD, murmurs, rubs, gallops or clicks. No pedal edema.   Gastrointestinal system: Abdomen is nondistended, soft and nontender. No organomegaly or masses felt. Normal bowel sounds heard. Central nervous system: Alert and oriented. No focal neurological deficits. Extremities: Symmetric 5 x 5 power. Skin: No rashes, lesions or ulcers Psychiatry: Judgement and insight appear normal. Mood & affect appropriate.      The results of significant diagnostics from this hospitalization (including imaging, microbiology, ancillary and laboratory) are listed below  for reference.      Labs: CBC: Recent Labs  Lab 11/03/17 1348 11/04/17 0705 11/05/17 0504  WBC 7.1 5.3 5.4  NEUTROABS 5.2  --   --   HGB 10.9* 9.6* 9.6*  HCT 31.2* 28.1* 27.6*  MCV 83.2 84.1 83.6  PLT 370 336 229   Basic Metabolic Panel: Recent Labs  Lab 11/03/17 1348 11/04/17 0705 11/04/17 0717 11/05/17 0504 11/06/17 0505  NA 134* 137  --  140 140  K 4.0 3.5  --  3.0* 3.2*  CL 103 111  --  109 109  CO2 16* 15*  --  20* 20*  GLUCOSE 111* 93  --  106* 88  BUN 56* 47*  --  27* 21*  CREATININE 3.15* 2.16*  --  1.84* 1.28*  CALCIUM 9.6 8.7*  --  8.7* 8.5*  MG 1.5*  --  1.2* 1.8  --    Liver Function Tests: Recent Labs  Lab 11/03/17 1348 11/04/17 0705 11/05/17 0504 11/06/17 0505  AST 196* 163* 176* 149*  ALT 179* 143* 148* 134*  ALKPHOS 834* 704* 681*  665*  BILITOT 19.7* 16.6* 10.9* 6.9*  PROT 8.4* 6.9 6.6 6.3*  ALBUMIN 2.7* 2.3* 2.1* 2.1*   CBG: Recent Labs  Lab 11/03/17 1955  GLUCAP 109*   Urinalysis    Component Value Date/Time   COLORURINE AMBER (A) 11/03/2017 1237   APPEARANCEUR HAZY (A) 11/03/2017 1237   LABSPEC 1.011 11/03/2017 1237   PHURINE 5.0 11/03/2017 1237   GLUCOSEU NEGATIVE 11/03/2017 1237   HGBUR SMALL (A) 11/03/2017 1237   BILIRUBINUR MODERATE (A) 11/03/2017 1237   KETONESUR NEGATIVE 11/03/2017 1237   PROTEINUR 30 (A) 11/03/2017 1237   NITRITE NEGATIVE 11/03/2017 1237   LEUKOCYTESUR NEGATIVE 11/03/2017 1237      Time coordinating discharge: Over 30 minutes  SIGNED:  Vernell Leep, MD, FACP, [facility]. [facility] Pager [phone]  If 7PM-7AM, please contact night-coverage [email or web address] Password [facility] 11/06/2017, 12:40 PM

## 2017-11-07 NOTE — Consult Note (Signed)
           [facility] CM Primary Care Navigator  11/07/2017  SWEDEN LESURE 29-Mar-1936 [id number]   Attempt to seepatient at the bedsideto identify possible discharge needs but she was alreadydischarged.  Per chart review, patient presented from primary care provider's office after an episode of syncope in the office and jaundice. CT of abdomen showed obstructive jaundice with pancreatic head mass.  Patient was discharged home over the weekend.  Patient has discharge instruction to follow-up with primary care provider as soon as possible for a visit with in 3 days (to be seen with repeat labs (CBC, CMP & Mg) and follow-up with gastroenterology with in 1 week.  Primary care provider's officecalled (Dr. Glendale Chard) to notify of patient's discharge and need for post hospital follow-up and transition of care (TOC).  Made aware to refer patient to [facility] care management if deemed necessary and appropriate for services.    For questions, please contact:  Dannielle Huh, BSN, RN- [facility] Primary Care Navigator  Telephone: [phone] [place]

## 2017-11-09 ENCOUNTER — Other Ambulatory Visit: Payer: Self-pay | Admitting: Gastroenterology

## 2017-11-09 DIAGNOSIS — R740 Nonspecific elevation of levels of transaminase and lactic acid dehydrogenase [LDH]: Secondary | ICD-10-CM | POA: Diagnosis not present

## 2017-11-09 DIAGNOSIS — R17 Unspecified jaundice: Secondary | ICD-10-CM | POA: Diagnosis not present

## 2017-11-09 DIAGNOSIS — R932 Abnormal findings on diagnostic imaging of liver and biliary tract: Secondary | ICD-10-CM | POA: Diagnosis not present

## 2017-11-09 DIAGNOSIS — C250 Malignant neoplasm of head of pancreas: Secondary | ICD-10-CM | POA: Diagnosis not present

## 2017-11-10 ENCOUNTER — Other Ambulatory Visit: Payer: Self-pay

## 2017-11-10 ENCOUNTER — Encounter (HOSPITAL_COMMUNITY): Payer: Self-pay

## 2017-11-10 DIAGNOSIS — R5383 Other fatigue: Secondary | ICD-10-CM | POA: Diagnosis not present

## 2017-11-10 DIAGNOSIS — E876 Hypokalemia: Secondary | ICD-10-CM | POA: Diagnosis not present

## 2017-11-10 DIAGNOSIS — Z09 Encounter for follow-up examination after completed treatment for conditions other than malignant neoplasm: Secondary | ICD-10-CM | POA: Diagnosis not present

## 2017-11-10 DIAGNOSIS — R17 Unspecified jaundice: Secondary | ICD-10-CM | POA: Diagnosis not present

## 2017-11-10 DIAGNOSIS — R229 Localized swelling, mass and lump, unspecified: Secondary | ICD-10-CM | POA: Diagnosis not present

## 2017-11-11 ENCOUNTER — Ambulatory Visit (HOSPITAL_COMMUNITY): Payer: PPO | Admitting: Anesthesiology

## 2017-11-11 ENCOUNTER — Encounter (HOSPITAL_COMMUNITY)
Admission: RE | Disposition: A | Discharge status: Home / Self Care | Payer: Self-pay | Source: Ambulatory Visit | Attending: Gastroenterology

## 2017-11-11 ENCOUNTER — Other Ambulatory Visit: Payer: Self-pay

## 2017-11-11 ENCOUNTER — Encounter (HOSPITAL_COMMUNITY): Payer: Self-pay | Admitting: *Deleted

## 2017-11-11 ENCOUNTER — Ambulatory Visit (HOSPITAL_COMMUNITY)
Admission: RE | Admit: 2017-11-11 | Discharge: 2017-11-11 | Disposition: A | Discharge status: Home / Self Care | Payer: PPO | Source: Ambulatory Visit | Attending: Gastroenterology | Admitting: Gastroenterology

## 2017-11-11 DIAGNOSIS — Z96653 Presence of artificial knee joint, bilateral: Secondary | ICD-10-CM | POA: Insufficient documentation

## 2017-11-11 DIAGNOSIS — C250 Malignant neoplasm of head of pancreas: Secondary | ICD-10-CM | POA: Insufficient documentation

## 2017-11-11 DIAGNOSIS — M1990 Unspecified osteoarthritis, unspecified site: Secondary | ICD-10-CM | POA: Insufficient documentation

## 2017-11-11 DIAGNOSIS — Z7982 Long term (current) use of aspirin: Secondary | ICD-10-CM | POA: Diagnosis not present

## 2017-11-11 DIAGNOSIS — Z79899 Other long term (current) drug therapy: Secondary | ICD-10-CM | POA: Insufficient documentation

## 2017-11-11 DIAGNOSIS — I10 Essential (primary) hypertension: Secondary | ICD-10-CM | POA: Diagnosis not present

## 2017-11-11 DIAGNOSIS — I2510 Atherosclerotic heart disease of native coronary artery without angina pectoris: Secondary | ICD-10-CM | POA: Insufficient documentation

## 2017-11-11 DIAGNOSIS — E039 Hypothyroidism, unspecified: Secondary | ICD-10-CM | POA: Diagnosis not present

## 2017-11-11 DIAGNOSIS — K8689 Other specified diseases of pancreas: Secondary | ICD-10-CM | POA: Diagnosis not present

## 2017-11-11 DIAGNOSIS — K838 Other specified diseases of biliary tract: Secondary | ICD-10-CM | POA: Diagnosis present

## 2017-11-11 DIAGNOSIS — I700 Atherosclerosis of aorta: Secondary | ICD-10-CM | POA: Diagnosis not present

## 2017-11-11 DIAGNOSIS — E669 Obesity, unspecified: Secondary | ICD-10-CM | POA: Diagnosis not present

## 2017-11-11 DIAGNOSIS — D649 Anemia, unspecified: Secondary | ICD-10-CM | POA: Diagnosis not present

## 2017-11-11 DIAGNOSIS — R932 Abnormal findings on diagnostic imaging of liver and biliary tract: Secondary | ICD-10-CM | POA: Diagnosis not present

## 2017-11-11 DIAGNOSIS — Z6830 Body mass index (BMI) 30.0-30.9, adult: Secondary | ICD-10-CM | POA: Insufficient documentation

## 2017-11-11 DIAGNOSIS — I129 Hypertensive chronic kidney disease with stage 1 through stage 4 chronic kidney disease, or unspecified chronic kidney disease: Secondary | ICD-10-CM | POA: Diagnosis not present

## 2017-11-11 DIAGNOSIS — N189 Chronic kidney disease, unspecified: Secondary | ICD-10-CM | POA: Diagnosis not present

## 2017-11-11 DIAGNOSIS — E785 Hyperlipidemia, unspecified: Secondary | ICD-10-CM | POA: Diagnosis not present

## 2017-11-11 HISTORY — DX: Hypothyroidism, unspecified: E03.9

## 2017-11-11 HISTORY — PX: FINE NEEDLE ASPIRATION: SHX5430

## 2017-11-11 HISTORY — PX: EUS: SHX5427

## 2017-11-11 HISTORY — DX: Pneumonia, unspecified organism: J18.9

## 2017-11-11 SURGERY — UPPER ENDOSCOPIC ULTRASOUND (EUS) LINEAR
Anesthesia: Monitor Anesthesia Care

## 2017-11-11 MED ORDER — LACTATED RINGERS IV SOLN
INTRAVENOUS | Status: DC
  Administered 2017-11-11: 08:00:00 via INTRAVENOUS

## 2017-11-11 MED ORDER — PROPOFOL 10 MG/ML IV BOLUS
INTRAVENOUS | Status: AC
  Filled 2017-11-11: qty 20

## 2017-11-11 MED ORDER — SODIUM CHLORIDE 0.9 % IV SOLN: INTRAVENOUS | Status: DC

## 2017-11-11 MED ORDER — PROPOFOL 10 MG/ML IV BOLUS
INTRAVENOUS | Status: DC | PRN
  Administered 2017-11-11 (×9): 10 mg via INTRAVENOUS
  Administered 2017-11-11: 20 mg via INTRAVENOUS
  Administered 2017-11-11 (×4): 10 mg via INTRAVENOUS
  Administered 2017-11-11: 20 mg via INTRAVENOUS
  Administered 2017-11-11 (×3): 10 mg via INTRAVENOUS
  Administered 2017-11-11: 20 mg via INTRAVENOUS
  Administered 2017-11-11 (×2): 10 mg via INTRAVENOUS
  Administered 2017-11-11: 20 mg via INTRAVENOUS
  Administered 2017-11-11 (×4): 10 mg via INTRAVENOUS
  Administered 2017-11-11: 20 mg via INTRAVENOUS
  Administered 2017-11-11 (×2): 10 mg via INTRAVENOUS
  Administered 2017-11-11: 20 mg via INTRAVENOUS
  Administered 2017-11-11: 10 mg via INTRAVENOUS
  Administered 2017-11-11: 20 mg via INTRAVENOUS

## 2017-11-11 NOTE — Transfer of Care (Signed)
Immediate Anesthesia Transfer of Care Note  Patient: Lauren Richard  Procedure(s) Performed: UPPER ENDOSCOPIC ULTRASOUND (EUS) LINEAR (N/A ) FINE NEEDLE ASPIRATION (FNA) LINEAR (N/A )  Patient Location: PACU  Anesthesia Type:MAC  Level of Consciousness: sedated  Airway & Oxygen Therapy: Patient Spontanous Breathing and Patient connected to nasal cannula oxygen  Post-op Assessment: Report given to RN and Post -op Vital signs reviewed and stable  Post vital signs: Reviewed and stable  Last Vitals:  Vitals:   11/11/17 0755  BP: 126/62  Pulse: 60  Resp: 20  Temp: 36.7 C  SpO2: 100%    Last Pain:  Vitals:   11/11/17 0755  TempSrc: Oral         Complications: No apparent anesthesia complications

## 2017-11-11 NOTE — Anesthesia Preprocedure Evaluation (Signed)
Anesthesia Evaluation  Patient identified by MRN, date of birth, ID band Patient awake    Reviewed: Allergy & Precautions, NPO status , Patient's Chart, lab work & pertinent test results, reviewed documented beta blocker date and time   History of Anesthesia Complications Negative for: history of anesthetic complications  Airway Mallampati: II  TM Distance: >3 FB Neck ROM: Full    Dental  (+) Lower Dentures, Upper Dentures   Pulmonary neg pulmonary ROS,    breath sounds clear to auscultation       Cardiovascular hypertension, Pt. on medications and Pt. on home beta blockers (-) angina(-) Past MI and (-) CHF  Rhythm:Regular     Neuro/Psych negative neurological ROS  negative psych ROS   GI/Hepatic negative GI ROS, Neg liver ROS,   Endo/Other  Hypothyroidism   Renal/GU Renal InsufficiencyRenal disease     Musculoskeletal  (+) Arthritis ,   Abdominal   Peds  Hematology  (+) anemia ,   Anesthesia Other Findings   Reproductive/Obstetrics                             Anesthesia Physical Anesthesia Plan  ASA: III  Anesthesia Plan: MAC   Post-op Pain Management:    Induction:   PONV Risk Score and Plan: 2 and Treatment may vary due to age or medical condition  Airway Management Planned: Nasal Cannula  Additional Equipment:   Intra-op Plan:   Post-operative Plan:   Informed Consent: I have reviewed the patients History and Physical, chart, labs and discussed the procedure including the risks, benefits and alternatives for the proposed anesthesia with the patient or authorized representative who has indicated his/her understanding and acceptance.   Dental advisory given  Plan Discussed with: CRNA and Surgeon  Anesthesia Plan Comments:         Anesthesia Quick Evaluation

## 2017-11-11 NOTE — Discharge Instructions (Signed)

## 2017-11-11 NOTE — Anesthesia Postprocedure Evaluation (Signed)
Anesthesia Post Note  Patient: [name]  Procedure(s) Performed: UPPER ENDOSCOPIC ULTRASOUND (EUS) LINEAR (N/A ) FINE NEEDLE ASPIRATION (FNA) LINEAR (N/A )     Patient location during evaluation: Endoscopy Anesthesia Type: MAC Level of consciousness: awake and alert Pain management: pain level controlled Vital Signs Assessment: post-procedure vital signs reviewed and stable Respiratory status: spontaneous breathing, nonlabored ventilation, respiratory function stable and patient connected to nasal cannula oxygen Cardiovascular status: stable and blood pressure returned to baseline Postop Assessment: no apparent nausea or vomiting Anesthetic complications: no    Last Vitals:  Vitals:   11/11/17 1010 11/11/17 1020  BP: 104/87 (!) 116/96  Pulse: (!) 48 60  Resp: 19 (!) 26  Temp:    SpO2: 100% 100%    Last Pain:  Vitals:   11/11/17 0959  TempSrc: Oral                 Fatuma Dowers

## 2017-11-11 NOTE — Interval H&P Note (Signed)
History and Physical Interval Note:  11/11/2017 9:07 AM  Lauren Richard  has presented today for surgery, with the diagnosis of pancreatic mass  The various methods of treatment have been discussed with the patient and family. After consideration of risks, benefits and other options for treatment, the patient has consented to  Procedure(s): UPPER ENDOSCOPIC ULTRASOUND (EUS) LINEAR (N/A) FINE NEEDLE ASPIRATION (FNA) LINEAR (N/A) as a surgical intervention .  The patient's history has been reviewed, patient examined, no change in status, stable for surgery.  I have reviewed the patient's chart and labs.  Questions were answered to the patient's satisfaction.     Carmeron Heady D

## 2017-11-11 NOTE — Op Note (Signed)
[facility] Patient Name: Lauren Richard Procedure Date: 11/11/2017 MRN: [id number] Attending MD: Carol Ada , MD Date of Birth: February 07, 1936 CSN: [id number] Age: 82 Admit Type: Inpatient Procedure:                Upper EUS Indications:              Common bile duct dilation (acquired) seen on ERCP Providers:                Carol Ada, MD, Presley Raddle, RN, William Dalton, Technician, Laurena Spies, Technician Referring MD:              Medicines:                Propofol per Anesthesia Complications:            No immediate complications. Estimated Blood Loss:     Estimated blood loss was minimal. Procedure:                Pre-Anesthesia Assessment:                           - Prior to the procedure, a History and Physical                            was performed, and patient medications and                            allergies were reviewed. The patient's tolerance of                            previous anesthesia was also reviewed. The risks                            and benefits of the procedure and the sedation                            options and risks were discussed with the patient.                            All questions were answered, and informed consent                            was obtained. Prior Anticoagulants: The patient has                            taken no previous anticoagulant or antiplatelet                            agents. ASA Grade Assessment: II - A patient with                            mild systemic disease. After reviewing the risks  and benefits, the patient was deemed in                            satisfactory condition to undergo the procedure.                           - Sedation was administered by an anesthesia                            professional. Deep sedation was attained.                           After obtaining informed consent, the endoscope was                  passed under direct vision. Throughout the                            procedure, the patient's blood pressure, pulse, and                            oxygen saturations were monitored continuously. The                            [id number] ([id number]) scope was introduced through                            the mouth, and advanced to the second part of                            duodenum. The upper EUS was technically difficult                            and complex. The patient tolerated the procedure                            well. Scope In: Scope Out: Findings:      Endosonographic Finding :      A round mass was identified in the pancreatic head. The mass was       hypoechoic. The mass measured 30 mm by 22 mm in maximal cross-sectional       diameter. The outer margins were irregular. Fine needle aspiration for       cytology was performed. Color Doppler imaging was utilized prior to       needle puncture to confirm a lack of significant vascular structures       within the needle path. Five passes were made with the 25 gauge needle       using a transduodenal approach. A stylet was used. A cytotechnologist       was present to evaluate the adequacy of the specimen. The cellularity of       the specimen was adequate. Final cytology results are pending.      The procedure was technically difficult as the metallic stent obscured       views of the lesion. It was also very difficult to obtain adequate views       for FNA, however,  adequate samples were obtained. Impression:               - A mass was identified in the pancreatic head.                            Fine needle aspiration performed. Moderate Sedation:      N/A- Per Anesthesia Care Recommendation:           - Patient has a contact number available for                            emergencies. The signs and symptoms of potential                            delayed complications were discussed with the                             patient. Return to normal activities tomorrow.                            Written discharge instructions were provided to the                            patient.                           - Resume regular diet.                           - Await cytology results. Procedure Code(s):        --- Professional ---                           [phone], Esophagogastroduodenoscopy, flexible,                            transoral; with transendoscopic ultrasound-guided                            intramural or transmural fine needle                            aspiration/biopsy(s), (includes endoscopic                            ultrasound examination limited to the esophagus,                            stomach or duodenum, and adjacent structures) Diagnosis Code(s):        --- Professional ---                           K86.89, Other specified diseases of pancreas                           K83.8, Other specified diseases of biliary tract CPT copyright 2016 [employer]. All rights reserved. The codes  documented in this report are preliminary and upon coder review may  be revised to meet current compliance requirements. Carol Ada, MD Carol Ada, MD 11/11/2017 10:50:22 AM This report has been signed electronically. Number of Addenda: 0

## 2017-11-14 ENCOUNTER — Encounter (HOSPITAL_COMMUNITY): Payer: Self-pay | Admitting: Gastroenterology

## 2017-11-15 ENCOUNTER — Encounter: Payer: Self-pay | Admitting: Nurse Practitioner

## 2017-11-15 ENCOUNTER — Telehealth: Payer: Self-pay | Admitting: Nurse Practitioner

## 2017-11-15 NOTE — Telephone Encounter (Signed)
"  Calling to confirm I will come in 11-24-2017 at 1:30 pm"  Message left for new patient coordinator with this information.

## 2017-11-15 NOTE — Telephone Encounter (Signed)
Received an in basket message from Dr. Benson Norway for the pt to see an oncologist. Pt has been scheduled for the pt to see Regan Rakers Burton/Dr. Burr Medico on 2/14 at 2pm. Lft the appt information on the pt's vm. Letter mailed with the appt date and time.

## 2017-11-24 ENCOUNTER — Telehealth: Payer: Self-pay | Admitting: Nurse Practitioner

## 2017-11-24 ENCOUNTER — Inpatient Hospital Stay: Payer: PPO | Attending: Nurse Practitioner | Admitting: Nurse Practitioner

## 2017-11-24 ENCOUNTER — Encounter: Payer: Self-pay | Admitting: Nurse Practitioner

## 2017-11-24 ENCOUNTER — Inpatient Hospital Stay: Payer: PPO

## 2017-11-24 DIAGNOSIS — C250 Malignant neoplasm of head of pancreas: Secondary | ICD-10-CM | POA: Diagnosis not present

## 2017-11-24 DIAGNOSIS — R634 Abnormal weight loss: Secondary | ICD-10-CM

## 2017-11-24 DIAGNOSIS — I4891 Unspecified atrial fibrillation: Secondary | ICD-10-CM | POA: Diagnosis not present

## 2017-11-24 DIAGNOSIS — I10 Essential (primary) hypertension: Secondary | ICD-10-CM | POA: Insufficient documentation

## 2017-11-24 DIAGNOSIS — R945 Abnormal results of liver function studies: Secondary | ICD-10-CM

## 2017-11-24 DIAGNOSIS — M549 Dorsalgia, unspecified: Secondary | ICD-10-CM | POA: Diagnosis not present

## 2017-11-24 DIAGNOSIS — D649 Anemia, unspecified: Secondary | ICD-10-CM | POA: Insufficient documentation

## 2017-11-24 DIAGNOSIS — D490 Neoplasm of unspecified behavior of digestive system: Secondary | ICD-10-CM | POA: Diagnosis not present

## 2017-11-24 DIAGNOSIS — E785 Hyperlipidemia, unspecified: Secondary | ICD-10-CM | POA: Insufficient documentation

## 2017-11-24 HISTORY — DX: Malignant neoplasm of head of pancreas: C25.0

## 2017-11-24 LAB — CMP (CANCER CENTER ONLY)
ALBUMIN: 3.1 g/dL — AB (ref 3.5–5.0)
ALK PHOS: 187 U/L — AB (ref 40–150)
ALT: 16 U/L (ref 0–55)
AST: 18 U/L (ref 5–34)
Anion gap: 10 (ref 3–11)
BUN: 8 mg/dL (ref 7–26)
CALCIUM: 9.5 mg/dL (ref 8.4–10.4)
CHLORIDE: 106 mmol/L (ref 98–109)
CO2: 26 mmol/L (ref 22–29)
CREATININE: 0.8 mg/dL (ref 0.60–1.10)
GFR, Estimated: >60 mL/min (ref >60)
GLUCOSE: 89 mg/dL (ref 70–140)
Potassium: 3.1 mmol/L — ABNORMAL LOW (ref 3.5–5.1)
Sodium: 142 mmol/L (ref 136–145)
Total Bilirubin: 2.2 mg/dL — ABNORMAL HIGH (ref 0.2–1.2)
Total Protein: 7.5 g/dL (ref 6.4–8.3)

## 2017-11-24 LAB — CBC WITH DIFFERENTIAL (CANCER CENTER ONLY)
BASOS PCT: 0 %
Basophils Absolute: 0 10*3/uL (ref 0.0–0.1)
EOS ABS: 0.2 10*3/uL (ref 0.0–0.5)
Eosinophils Relative: 2 %
HEMATOCRIT: 32.5 % — AB (ref 34.8–46.6)
Hemoglobin: 10.5 g/dL — ABNORMAL LOW (ref 11.6–15.9)
LYMPHS ABS: 2.6 10*3/uL (ref 0.9–3.3)
Lymphocytes Relative: 31 %
MCH: 29.4 pg (ref 25.1–34.0)
MCHC: 32.3 g/dL (ref 31.5–36.0)
MCV: 91 fL (ref 79.5–101.0)
MONO ABS: 0.6 10*3/uL (ref 0.1–0.9)
MONOS PCT: 7 %
NEUTROS PCT: 60 %
Neutro Abs: 5.2 10*3/uL (ref 1.5–6.5)
Platelet Count: 348 10*3/uL (ref 145–400)
RBC: 3.57 MIL/uL — ABNORMAL LOW (ref 3.70–5.45)
RDW: 16.6 % — AB (ref 11.2–14.5)
WBC Count: 8.5 10*3/uL (ref 3.9–10.3)

## 2017-11-24 NOTE — Telephone Encounter (Signed)
Scheduled appt per 2/14 los - gave patient AVS - referral faxed to [facility]

## 2017-11-24 NOTE — Progress Notes (Addendum)
[place]  [phone] Fax:([phone]) [place] Note   Patient Care Team: Glendale Chard, MD as PCP - General (Internal Medicine) 11/24/2017  CHIEF COMPLAINTS/PURPOSE OF CONSULTATION:  Newly diagnosed pancreas cancer   REFERRING PROVIDER: Dr. Carol Ada  SUMMARY OF ONCOLOGY HISTORY Cancer Staging Adenocarcinoma of head of pancreas [facility]) Staging form: Exocrine Pancreas, AJCC 8th Edition - Clinical: Stage IB (cT2, cN0, cM0) - Unsigned    Adenocarcinoma of head of pancreas ([place])   11/03/2017 Imaging    CT AP WO CONTRAST IMPRESSION: 1. Findings are concerning for common bile duct obstruction, potentially related to a primary pancreatic neoplasm, as detailed above. At this time this is associated with moderate to severe intra and extrahepatic biliary ductal dilatation. Further evaluation with MRI of the abdomen with and without IV gadolinium with MRCP should be considered in the near future to better evaluate these findings. Alternatively, if the patient is not likely to be able to adequately hold her breath during an MRI examination, pancreatic protocol CT scan would be recommended. 2. Biliary sludge in the gallbladder. No findings to suggest an acute cholecystitis at this time. 3. Small right adrenal adenoma incidentally noted. 4. Aortic atherosclerosis, in addition to 3 vessel coronary artery disease. Assessment for potential risk factor modification, dietary therapy or pharmacologic therapy may be warranted, if clinically indicated.  Aortic Atherosclerosis (ICD10-I70.0).       11/04/2017 Procedure    ERCP per Dr. Benson Norway - The major papilla appeared normal. - A segmental biliary stricture was found. The stricture was malignant appearing. - The entire biliary tree was severely dilated, with a mass causing an obstruction. - A biliary sphincterotomy was performed. - One covered metal stent was placed into the common bile  duct.      11/04/2017 Imaging    FINDINGS: Imaging demonstrates cannulation of the common bile duct. The common bile duct is dilated. The inferior common bile duct is narrowed. Final image demonstrates placement of a metallic stent across the area of narrowing into the duodenum.  IMPRESSION: Common bile duct stent placement.      11/11/2017 Procedure    EUS per Dr. Benson Norway Endosonographic Finding Findings: A round mass was identified in the pancreatic head. The mass was hypoechoic. The mass measured 30 mm by 22 mm in maximal cross-sectional diameter. The outer margins were irregular. Fine needle aspiration for cytology was performed. Color Doppler imaging was utilized prior to needle puncture to confirm a lack of significant vascular structures within the needle path. Five passes were made with the 25 gauge needle using a transduodenal approach. A stylet was used. A cytotechnologist was present to evaluate the adequacy of the specimen. The cellularity of the specimen was adequate. Final cytology results are pending. The procedure was technically difficult as the metallic stent obscured views of the lesion. It was also very difficult to obtain adequate views for FNA, however, adequate samples were obtained.      11/11/2017 Pathology Results    Diagnosis FINE NEEDLE ASPIRATION, ENDOSCOPIC, PANCREAS HEAD(SPECIMEN 1 OF 1 COLLECTED 11/11/17): ADENOCARCINOMA. Preliminary Diagnosis Intraoperative Diagnosis: SPINDLE CELL FRAGMENTS AND RARE ATYPICAL EPITHELIAL CELLS.(JDP)      11/24/2017 Initial Diagnosis    Adenocarcinoma of head of pancreas (HCC)      HISTORY OF PRESENTING ILLNESS:  AYNSLEE MULHALL 82 y.o. female is here because of newly diagnosed pancreas cancer. She presented to ED on 10/18/17 complaining of weakness and chest discomfort, she reported a 10-day history of anorexia  with intermittent nausea and vomiting.  She was evaluated and found to be in A. Fib with RVR, EKG without  ischemia.  CTA negative for PE.  Symptoms improved and she was discharged home on Xarelto and CCB with follow-up at A. fib clinic on 10/24/2017; found to have abnormal TFTs on workup.  She was found to not have A. fib, Xarelto was stopped.  She presented to her PCP for regularly scheduled follow-up on 11/03/2017; with complaints of decreased appetite, 5-10 lbs weight loss, fatigue, and intermittent dizziness.  Had syncopal event there and found to have painless jaundice, was directly sent to ED for further workup, LFTs markedly elevated, T bili in the ED was elevated to 19.7.   During hospitalization she had AKI; had noncontrast CT on 11/03/2017 with findings of a mass like area measuring 2.1 x 2.3 x 3.1 cm in the head of the pancreas immediately below the level of the common bile duct truncation as well as findings concerning for common bile duct obstruction with moderate to severe intra-and extrahepatic biliary ductal dilatation.  No lymphadenopathy noted in the abdomen or pelvis.  ERCP showed the entire biliary tree to be severely dilated and diffusely dilated with a mass causing the obstruction. Biliary sphincterotomy was performed and a metal stent was successfully placed into the common bile duct.  She improved clinically and LFTs improved, she was discharged home to follow-up with Dr. Benson Norway outpatient with EUS with FNA.  Biopsy confirmed adenocarcinoma of the pancreatic head.  Past medical history is significant for hyperlipidemia, hypertension, arthritis.  Mild anemia found in July 2018, Hgb 10.2 at that time, 9.6 during recent hospitalization.  She has history of oral iron supplementation and received a blood transfusion with hysterectomy in the past. Endocrinology consult is pending for hyperthyroidism due to altered TFTs; cardizem was stopped and she was started on beta-blocker for tachycardia during hospitalization.  She has positive family history for renal cancer in her mother and lung cancer in her  father, he was a smoker.  She has occasional wine socially, never smoked.  She is widowed, did not have children; lives alone, independent of all ADLs, she does not drive but has family close by who drive and support her.  Prior to her illness she exercised at the [facility] twice weekly; remains active in her church community.   Today she feels well, her appetite is returning.  She feels more like her usual self.  Denies pain, nausea.  BMs are regular.  She has mild back pain at her baseline, controlled with Tylenol as needed.  MEDICAL HISTORY:  Past Medical History:  Diagnosis Date  . Adenocarcinoma of head of pancreas ([place]) 11/24/2017  . Arthritis    "all over" (04/26/2017); knees, shoulder  . Essential hypertension 03/03/2017  . History of blood transfusion 1979   "when I had my hysterectomy"  . Hyperlipidemia   . Hypothyroidism   . Obesity 03/03/2017  . Osteoarthritis 03/03/2017  . Pneumonia    as a child    SURGICAL HISTORY: Past Surgical History:  Procedure Laterality Date  . BREAST CYST EXCISION Right    "cut 6 cysts out"  . CYST EXCISION Left    "off my toe"  . DILATION AND CURETTAGE OF UTERUS     "before hysterectomy"  . ERCP N/A 11/04/2017   Procedure: ENDOSCOPIC RETROGRADE CHOLANGIOPANCREATOGRAPHY (ERCP);  Surgeon: Carol Ada, MD;  Location: [place];  Service: Endoscopy;  Laterality: N/A;  . EUS N/A 11/11/2017   Procedure: UPPER  ENDOSCOPIC ULTRASOUND (EUS) LINEAR;  Surgeon: Carol Ada, MD;  Location: [facility];  Service: Endoscopy;  Laterality: N/A;  . FINE NEEDLE ASPIRATION N/A 11/11/2017   Procedure: FINE NEEDLE ASPIRATION (FNA) LINEAR;  Surgeon: Carol Ada, MD;  Location: [facility];  Service: Endoscopy;  Laterality: N/A;  . INGUINAL HERNIA REPAIR Right   . JOINT REPLACEMENT    . REDUCTION MAMMAPLASTY Bilateral 1980  . TONSILLECTOMY     "when I small"  . TOTAL ABDOMINAL HYSTERECTOMY  1979  . TOTAL KNEE ARTHROPLASTY Right 04/26/2017  . TOTAL KNEE  ARTHROPLASTY Left 04/26/2017   Procedure: LEFT TOTAL KNEE ARTHROPLASTY;  Surgeon: [name], MD;  Location: [place];  Service: Orthopedics;  Laterality: Left;    SOCIAL HISTORY: Social History   Socioeconomic History  . Marital status: Widowed    Spouse name: Not on file  . Number of children: Not on file  . Years of education: Not on file  . Highest education level: Not on file  Social Needs  . Financial resource strain: Not on file  . Food insecurity - worry: Not on file  . Food insecurity - inability: Not on file  . Transportation needs - medical: Not on file  . Transportation needs - non-medical: Not on file  Occupational History  . Not on file  Tobacco Use  . Smoking status: Never Smoker  . Smokeless tobacco: Never Used  Substance and Sexual Activity  . Alcohol use: No    Alcohol/week: 0.0 oz    Frequency: Never    Comment: occasional glass of wine   . Drug use: No  . Sexual activity: No  Other Topics Concern  . Not on file  Social History Narrative  . Not on file    FAMILY HISTORY: Family History  Problem Relation Age of Onset  . Kidney cancer Mother 4  . Lung cancer Father 78       lung, smoker  . Congestive Heart Failure Sister   . Asthma Sister   . Atrial fibrillation Sister   . Hypertension Sister   . Diabetes Sister     ALLERGIES:  has No Known Allergies.  MEDICATIONS:  Current Outpatient Medications  Medication Sig Dispense Refill  . acetaminophen (TYLENOL) 500 MG tablet Take 1,000 mg by mouth daily as needed.    Marland Kitchen amLODipine (NORVASC) 5 MG tablet Take 5 mg by mouth daily.     Marland Kitchen aspirin 81 MG tablet Take 81 mg by mouth daily.    Marland Kitchen atenolol (TENORMIN) 25 MG tablet Take 1 tablet (25 mg total) by mouth daily. 30 tablet 0  . Cholecalciferol (VITAMIN D) 2000 units tablet Take 2,000 Units by mouth daily.    Marland Kitchen lidocaine-prilocaine (EMLA) cream Apply 1 application topically as needed (BACK PAIN).    Marland Kitchen potassium chloride (K-DUR) 10 MEQ tablet  Take 10 mEq by mouth daily.      No current facility-administered medications for this visit.     REVIEW OF SYSTEMS:   Constitutional: Denies fevers, chills or abnormal night sweats (+) weight loss (+) fatigue, improving (+) low appetite, improving Eyes: Denies blurriness of vision, double vision or watery eyes Ears, nose, mouth, throat, and face: Denies mucositis or sore throat Respiratory: Denies cough, dyspnea or wheezes Cardiovascular: Denies palpitation, chest discomfort or lower extremity swelling Gastrointestinal:  Denies constipation, diarrhea, heartburn or change in bowel habits (+) presented with nausea/vomiting, improved now Skin: Denies abnormal skin rashes (+) jaundice at presentation Lymphatics: Denies new lymphadenopathy or easy bruising  Neurological:Denies numbness, tingling or new weaknesses (+) presented with dizziness, denies today Behavioral/Psych: Mood is stable, no new changes  All other systems were reviewed with the patient and are negative.  PHYSICAL EXAMINATION: ECOG PERFORMANCE STATUS: 1 - Symptomatic but completely ambulatory  Vitals:   11/24/17 1300  BP: (!) 156/98  Pulse: 78  Resp: 16  Temp: 97.9 F (36.6 C)  SpO2: 100%   Filed Weights   11/24/17 1300  Weight: 197 lb (89.4 kg)    GENERAL:alert, no distress and comfortable SKIN: skin color, texture, turgor are normal, no rashes or significant lesions EYES: normal, conjunctiva are pink and non-injected, (+) mild scleral icterus OROPHARYNX:no exudate, no erythema and lips, buccal mucosa, and tongue normal  NECK: supple, thyroid normal size, non-tender, without nodularity LYMPH:  no palpable cervical, supraclavicular, axillary, or inguinal lymphadenopathy  LUNGS: clear to auscultation bilaterally with normal breathing effort HEART: regular rate & rhythm and no murmurs and no lower extremity edema ABDOMEN:abdomen soft, non-tender and normal bowel sounds.  No hepatomegaly Musculoskeletal:no cyanosis  of digits and no clubbing  PSYCH: alert & oriented x 3 with fluent speech NEURO: no focal motor/sensory deficits  LABORATORY DATA:  I have reviewed the data as listed CBC Latest Ref Rng & Units 11/24/2017 11/05/2017 11/04/2017  WBC 3.9 - 10.3 K/uL 8.5 5.4 5.3  Hemoglobin 12.0 - 15.0 g/dL - 9.6(L) 9.6(L)  Hematocrit 34.8 - 46.6 % 32.5(L) 27.6(L) 28.1(L)  Platelets 145 - 400 K/uL 348 333 336   CMP Latest Ref Rng & Units 11/24/2017 11/06/2017 11/05/2017  Glucose 70 - 140 mg/dL 89 88 106(H)  BUN 7 - 26 mg/dL 8 21(H) 27(H)  Creatinine 0.60 - 1.10 mg/dL 0.80 1.28(H) 1.84(H)  Sodium 136 - 145 mmol/L 142 140 140  Potassium 3.5 - 5.1 mmol/L 3.1(L) 3.2(L) 3.0(L)  Chloride 98 - 109 mmol/L 106 109 109  CO2 22 - 29 mmol/L 26 20(L) 20(L)  Calcium 8.4 - 10.4 mg/dL 9.5 8.5(L) 8.7(L)  Total Protein 6.4 - 8.3 g/dL 7.5 6.3(L) 6.6  Total Bilirubin 0.2 - 1.2 mg/dL 2.2(H) 6.9(H) 10.9(H)  Alkaline Phos 40 - 150 U/L 187(H) 665(H) 681(H)  AST 5 - 34 U/L 18 149(H) 176(H)  ALT 0 - 55 U/L 16 134(H) 148(H)   PATHOLOGY Diagnosis 11/11/17 FINE NEEDLE ASPIRATION, ENDOSCOPIC, PANCREAS HEAD(SPECIMEN 1 OF 1 COLLECTED 11/11/17): ADENOCARCINOMA. Preliminary Diagnosis Intraoperative Diagnosis: SPINDLE CELL FRAGMENTS AND RARE ATYPICAL EPITHELIAL CELLS.(JDP)  RADIOGRAPHIC STUDIES: I have personally reviewed the radiological images as listed and agreed with the findings in the report. Ct Abdomen Pelvis Wo Contrast  Result Date: 11/03/2017 CLINICAL DATA:  82 year old female with history of syncope. EXAM: CT ABDOMEN AND PELVIS WITHOUT CONTRAST TECHNIQUE: Multidetector CT imaging of the abdomen and pelvis was performed following the standard protocol without IV contrast. COMPARISON:  No priors. FINDINGS: Lower chest: Mild scarring in the right lung base. Atherosclerotic calcifications in the left anterior descending, left circumflex and right coronary arteries. Small hiatal hernia. Hepatobiliary: No definite cystic or solid hepatic  lesions are confidently identified on today's noncontrast CT examination. Although today's study is a noncontrast CT examination, there is moderate to severe intrahepatic biliary ductal dilatation. Common bile duct is markedly dilated measuring 18 mm in the porta hepatis. This abruptly terminates shortly above the expected location of the ampulla. Gallbladder is moderately distended, without evidence of gallstones. Intermediate attenuation material lying dependently in the gallbladder likely reflects biliary sludge. No surrounding inflammatory changes to suggest an acute cholecystitis at this time. Pancreas: In  the head of the pancreas immediately below the level of common bile duct truncation (axial image 37 of series 3 and coronal image 50 of series 6) there is a mass-like area measuring 2.1 x 2.3 x 3.1 cm which is slightly higher attenuation than the remainder of the pancreatic parenchyma and demonstrates no internal fatty marbling as is seen in the remainder of the pancreatic parenchyma, concerning for primary pancreatic neoplasm. The body and tail of the pancreas are grossly normal in appearance. No pancreatic or peripancreatic fluid or inflammatory changes. Spleen: Unremarkable. Adrenals/Urinary Tract: Unenhanced appearance of the right kidney and left adrenal gland is normal. 1.3 cm low-attenuation lesion in the posterior aspect of the interpolar region of the left kidney is incompletely characterized on today's noncontrast CT examination, but is statistically likely a cyst. 1.6 cm low-attenuation (-3 HU) right adrenal nodule is compatible with an adenoma. No hydroureteronephrosis. Urinary bladder is normal in appearance. Stomach/Bowel: Normal appearance of the stomach. No pathologic dilatation of small bowel or colon. The appendix is not confidently identified and may be surgically absent. Regardless, there are no inflammatory changes noted adjacent to the cecum to suggest the presence of an acute  appendicitis at this time. Vascular/Lymphatic: Aortic atherosclerosis, without evidence of aneurysm or dissection in the abdominal or pelvic vasculature. No lymphadenopathy noted in the abdomen or pelvis. Reproductive: Status post hysterectomy. Ovaries are not confidently identified may be surgically absent or atrophic. Other: No significant volume of ascites.  No pneumoperitoneum. Musculoskeletal: There are no aggressive appearing lytic or blastic lesions noted in the visualized portions of the skeleton. IMPRESSION: 1. Findings are concerning for common bile duct obstruction, potentially related to a primary pancreatic neoplasm, as detailed above. At this time this is associated with moderate to severe intra and extrahepatic biliary ductal dilatation. Further evaluation with MRI of the abdomen with and without IV gadolinium with MRCP should be considered in the near future to better evaluate these findings. Alternatively, if the patient is not likely to be able to adequately hold her breath during an MRI examination, pancreatic protocol CT scan would be recommended. 2. Biliary sludge in the gallbladder. No findings to suggest an acute cholecystitis at this time. 3. Small right adrenal adenoma incidentally noted. 4. Aortic atherosclerosis, in addition to 3 vessel coronary artery disease. Assessment for potential risk factor modification, dietary therapy or pharmacologic therapy may be warranted, if clinically indicated. Aortic Atherosclerosis (ICD10-I70.0). Electronically Signed   By: Vinnie Langton M.D.   On: 11/03/2017 16:08   Ct Head Wo Contrast  Result Date: 11/03/2017 CLINICAL DATA:  Syncope lasting between 5 and 30 seconds. EXAM: CT HEAD WITHOUT CONTRAST TECHNIQUE: Contiguous axial images were obtained from the base of the skull through the vertex without intravenous contrast. COMPARISON:  None. FINDINGS: Brain: No evidence of acute infarction, hemorrhage, hydrocephalus, extra-axial collection or mass  lesion/mass effect. Vascular: Atherosclerotic calcification.  No hyperdense vessel. Skull: No acute or aggressive finding Sinuses/Orbits: Negative IMPRESSION: 1. Age normal appearance of the brain. 2. Atherosclerosis. Electronically Signed   By: Monte Fantasia M.D.   On: 11/03/2017 16:00   Dg Ercp Biliary & Pancreatic Ducts  Result Date: 11/04/2017 CLINICAL DATA:  Common bile duct calculus EXAM: ERCP TECHNIQUE: Multiple spot images obtained with the fluoroscopic device and submitted for interpretation post-procedure. FLUOROSCOPY TIME:  Fluoroscopy Time:  1 minutes and 41 seconds Radiation Exposure Index (if provided by the fluoroscopic device): Number of Acquired Spot Images: 0 COMPARISON:  None. FINDINGS: Imaging demonstrates cannulation of the common  bile duct. The common bile duct is dilated. The inferior common bile duct is narrowed. Final image demonstrates placement of a metallic stent across the area of narrowing into the duodenum. IMPRESSION: Common bile duct stent placement. These images were submitted for radiologic interpretation only. Please see the procedural report for the amount of contrast and the fluoroscopy time utilized. Electronically Signed   By: Marybelle Killings M.D.   On: 11/04/2017 14:02    ASSESSMENT & PLAN: Kairy Folsom is a pleasant 82 year old AAF who presented with syncopal event and obstructive juandice found to have malignant pancreatic head mass.   1. Adenocarcinoma of pancreatic head, cT2N0M0, stage IB -Reviewed her medical records, imaging, and pathology in detail with the patient and family. She has what appears to be localized adenocarcinoma on pancreatic head. We discussed the nature of pancreatic adenocarcinoma and treatment modalities. Will refer her to general surgeon Dr. Barry Dienes for evaluation for surgery. Noncontrast CT on 11/03/17 was not ideal to evaluate the vasculature and resectability of her tumor. Will obtain CT pancreatic protocol as her renal function has  improved. Reviewed that even with complete resection, the recurrence rate for her type of cancer is around 50%. We discussed the role of chemotherapy in the neoadjuvant and adjuvant setting; she is elderly but in good overall condition and physically active, would be a good candidate for chemotherapy. Will present her case at GI tumor conference, and f/u with patient with recommendations. If she proceeds with surgery, will see her back 2-3 weeks post-op to discuss further treatment. Will follow CA 19-9.  2. Elevated LFTs, hyperbilirubinemia, obstructive juandice -She presented with obstructive juandice and markedly elevated LFTs secondary to obstruction, s/p CBD stent placement. LFTs and bili trending down.   3. AKI  -She had AKI during hospitalization, Cr up to 3.15, likely due to dehydration and decreased po intake; she had normal renal function prior to diagnosis. She had non-contrast CT during hospitalization; renal function normal today Cr 0.8. Will monitor closely.   4. Weight loss, decreased appetite, fatigue  She has 13 lbs weight loss in 2 months, since symptom onset. She reports her appetite is returning, no n/v lately. She is feeling closer to her baseline. We briefly discussed nutrition as it relates to surgery recovery and chemotherapy. Will monitor weight and nutritional status closely and involve dietician as needed.    5. HTN, hyperlipidemia -With PCP  PLAN: -CT pancreatic protocol within 1 week -Referral to Dr. Barry Dienes, general surgery -Review case in GI tumor conference, f/u with patient after -F/u pending CT results and surgical consult   Orders Placed This Encounter  Procedures  . CT Abdomen Pelvis W Wo Contrast    This exam should ONLY be ordered for initial diagnosis or follow up of known pancreatic/liver/renal/bladder masses.    Standing Status:   Future    Standing Expiration Date:   02/22/2019    Scheduling Instructions:     Pancreatic protocol    Order Specific  Question:   If indicated for the ordered procedure, I authorize the administration of contrast media per Radiology protocol    Answer:   Yes    Order Specific Question:   Preferred imaging location?    Answer:   [facility]    Order Specific Question:   Radiology Contrast Protocol - do NOT remove file path    Answer:   [email or web address]    Order Specific Question:   Reason for Exam additional comments    Answer:  adenocarcinoma of pancreatic head, pancreatic protocol  . CBC with Differential (Cancer Center Only)    Standing Status:   Standing    Number of Occurrences:   50    Standing Expiration Date:   11/25/2023  . CMP ([facility] only)    Standing Status:   Standing    Number of Occurrences:   50    Standing Expiration Date:   11/25/2023  . CA 19.9    Standing Status:   Standing    Number of Occurrences:   50    Standing Expiration Date:   11/25/2023  . Ambulatory referral to General Surgery    Referral Priority:   Routine    Referral Type:   Surgical    Referral Reason:   Specialty Services Required    Requested Specialty:   General Surgery    Number of Visits Requested:   1    All questions were answered. The patient knows to call the clinic with any problems, questions or concerns. I spent 50 minutes counseling the patient face to face. The total time spent in the appointment was 60 minutes and more than 50% was on counseling and review of test results.     Alla Feeling, NP 11/24/2017 4:53 PM  Addendum I have seen the patient, examined her. I agree with the assessment and and plan and have edited the notes.   Ms Sinkler is a 82 yo AAF, with past medical history of hypertension and hyperlipidemia, overall healthy and fit, presented with painless jaundice, fatigue, and weight loss.  Further workup revealed a 3.1cm mass in the pancreatic head, fine-needle biopsy showed adenocarcinoma.  No image evidence of distant metastasis.  However her CT  of abdomen and the pelvis was without contrast due to her AKI.  She has recovered well after CBD stent placement, AK I resolved, I will order a CT of abdomen pancreatic protocol for further evaluation of the resectability of her pancreatic cancer.  Although she has just turned to 87 yesterday, she is fit and healthy, and willing to have surgery.  I will refer her to Dr. Barry Dienes to discuss surgery, likely [facility].   Discussed the high risk of recurrence after surgery, given the aggressive nature of pancreatic cancer.  I recommend adjuvant chemotherapy if she recovers well.  We discussed different regimens of adjuvant chemotherapy, depends on her recovery and overall condition, will make a decision after her surgery.  If she has borderline resectable disease by CT scan, we may consider neoadjuvant chemotherapy.   Will discuss her case in GI conference next week, and finalize her treatment plan after her visit with Dr. Barry Dienes.  Truitt Merle  11/24/2017

## 2017-11-24 NOTE — Progress Notes (Signed)
  Oncology Nurse Navigator Documentation  Navigator Location: [facility] (11/24/17 1452) Referral date to RadOnc/MedOnc: 11/15/17 (11/24/17 1452) )Navigator Encounter Type: Initial MedOnc (11/24/17 1452)   Abnormal Finding Date: 11/03/17 (11/24/17 1452) Confirmed Diagnosis Date: 11/14/17 (11/24/17 1452)               Patient Visit Type: MedOnc;Initial (11/24/17 1452) Treatment Phase: Pre-Tx/Tx Discussion (11/24/17 1452) Barriers/Navigation Needs: Education (11/24/17 1452) Education: Understanding Cancer/ Treatment Options;Coping with Diagnosis/ Prognosis (11/24/17 1452) Interventions: Education (11/24/17 1452)     Education Method: Written;Verbal (11/24/17 1452)   Booklet from [facility] [employer] network: An Overview of Pancreatic Cancer provided to patient. Patient provided with my contact information for further questions or needs.   Acuity: Level 2 (11/24/17 1452)         Time Spent with Patient: 15 (11/24/17 1452)

## 2017-11-24 NOTE — Addendum Note (Signed)
Addended by: Truitt Merle on: 11/24/2017 09:29 PM   Modules accepted: Orders

## 2017-11-25 LAB — CANCER ANTIGEN 19-9: CAN 19-9: 11 U/mL (ref 0–35)

## 2017-12-01 ENCOUNTER — Ambulatory Visit (HOSPITAL_COMMUNITY)
Admission: RE | Admit: 2017-12-01 | Discharge: 2017-12-01 | Disposition: A | Discharge status: Home / Self Care | Payer: PPO | Source: Ambulatory Visit | Attending: Hematology | Admitting: Hematology

## 2017-12-01 ENCOUNTER — Encounter (HOSPITAL_COMMUNITY): Payer: Self-pay

## 2017-12-01 ENCOUNTER — Ambulatory Visit (HOSPITAL_COMMUNITY): Payer: PPO

## 2017-12-01 DIAGNOSIS — C259 Malignant neoplasm of pancreas, unspecified: Secondary | ICD-10-CM | POA: Diagnosis not present

## 2017-12-01 DIAGNOSIS — D490 Neoplasm of unspecified behavior of digestive system: Secondary | ICD-10-CM | POA: Diagnosis not present

## 2017-12-01 DIAGNOSIS — I700 Atherosclerosis of aorta: Secondary | ICD-10-CM | POA: Diagnosis not present

## 2017-12-01 DIAGNOSIS — J9811 Atelectasis: Secondary | ICD-10-CM | POA: Diagnosis not present

## 2017-12-01 DIAGNOSIS — R590 Localized enlarged lymph nodes: Secondary | ICD-10-CM | POA: Diagnosis not present

## 2017-12-01 MED ORDER — IOPAMIDOL (ISOVUE-300) INJECTION 61%
30.0000 mL | Freq: Once | INTRAVENOUS | Status: AC | PRN
  Administered 2017-12-01: 30 mL via ORAL

## 2017-12-01 MED ORDER — IOPAMIDOL (ISOVUE-300) INJECTION 61%
100.0000 mL | Freq: Once | INTRAVENOUS | Status: AC | PRN
  Administered 2017-12-01: 100 mL via INTRAVENOUS

## 2017-12-01 MED ORDER — IOPAMIDOL (ISOVUE-300) INJECTION 61%
INTRAVENOUS | Status: AC
  Filled 2017-12-01: qty 30

## 2017-12-01 MED ORDER — IOPAMIDOL (ISOVUE-300) INJECTION 61%
INTRAVENOUS | Status: AC
  Administered 2017-12-01: 100 mL via INTRAVENOUS
  Filled 2017-12-01: qty 100

## 2017-12-06 ENCOUNTER — Other Ambulatory Visit: Payer: Self-pay | Admitting: General Surgery

## 2017-12-06 DIAGNOSIS — C250 Malignant neoplasm of head of pancreas: Secondary | ICD-10-CM | POA: Diagnosis not present

## 2017-12-12 ENCOUNTER — Telehealth: Payer: Self-pay | Admitting: Hematology

## 2017-12-12 NOTE — Telephone Encounter (Signed)
Per 2/28 sch message - I tried to contact patient and schedule an appt - patient said she already had it done but I don't see it in her chart. I don't think that she understood what the genetic consultation was for. - per patient nothing to schedule. Sent message to Genetic counslers to see if appt was still needed

## 2017-12-29 NOTE — Pre-Procedure Instructions (Signed)
Lauren Richard  12/29/2017      [facility] - [place] ([place]), [place] [address] [place] [address] Phone: [phone] Fax: [phone]    Your procedure is scheduled on January 05, 2018.  Report to [facility] Admitting at 530 AM.  Call this number if you have problems the morning of surgery:  [phone]   Remember:  Do not eat food or drink liquids after midnight.  Take these medicines the morning of surgery with A SIP OF WATER; atenolol (tenormin) tylenol-if needed amlodipine (norvasc),                        Please finish drinking your Ensure pre-surgery drink before leaving your house the morning of surgery.  It is best to drink in one sitting, and not to sip.  7 days prior to surgery STOP taking any Aspirin (unless otherwise instructed by your surgeon), Aleve, Naproxen, Ibuprofen, Motrin, Advil, Goody's, BC's, all herbal medications, fish oil, and all vitamins  Continue all other medications as instructed by your physician except follow the above medication instructions before surgery   Do not wear jewelry, make-up or nail polish.  Do not wear lotions, powders, or perfumes, or deodorant.  Do not shave 48 hours prior to surgery.    Do not bring valuables to the hospital.  [facility] is not responsible for any belongings or valuables.  Contacts, dentures or bridgework may not be worn into surgery.  Leave your suitcase in the car.  After surgery it may be brought to your room.  For patients admitted to the hospital, discharge time will be determined by your treatment team.  Patients discharged the day of surgery will not be allowed to drive home.   [place]- Preparing For Surgery  Before surgery, you can play an important role. Because skin is not sterile, your skin needs to be as free of germs as possible. You can reduce the number of germs on your skin by washing with CHG (chlorahexidine gluconate) Soap  before surgery.  CHG is an antiseptic cleaner which kills germs and bonds with the skin to continue killing germs even after washing.  Please do not use if you have an allergy to CHG or antibacterial soaps. If your skin becomes reddened/irritated stop using the CHG.  Do not shave (including legs and underarms) for at least 48 hours prior to first CHG shower. It is OK to shave your face.  Please follow these instructions carefully.   1. Shower the NIGHT BEFORE SURGERY and the MORNING OF SURGERY with CHG.   2. If you chose to wash your hair, wash your hair first as usual with your normal shampoo.  3. After you shampoo, rinse your hair and body thoroughly to remove the shampoo.  4. Use CHG as you would any other liquid soap. You can apply CHG directly to the skin and wash gently with a scrungie or a clean washcloth.   5. Apply the CHG Soap to your body ONLY FROM THE NECK DOWN.  Do not use on open wounds or open sores. Avoid contact with your eyes, ears, mouth and genitals (private parts). Wash Face and genitals (private parts)  with your normal soap.  6. Wash thoroughly, paying special attention to the area where your surgery will be performed.  7. Thoroughly rinse your body with warm water from the neck down.  8. DO NOT shower/wash  with your normal soap after using and rinsing off the CHG Soap.  9. Pat yourself dry with a CLEAN TOWEL.  10. Wear CLEAN PAJAMAS to bed the night before surgery, wear comfortable clothes the morning of surgery  11. Place CLEAN SHEETS on your bed the night of your first shower and DO NOT SLEEP WITH PETS.   Day of Surgery: Do not apply any deodorants/lotions. Please wear clean clothes to the hospital/surgery center.    Please read over the following fact sheets that you were given. Pain Booklet, Coughing and Deep Breathing and Surgical Site Infection Prevention

## 2017-12-30 ENCOUNTER — Other Ambulatory Visit: Payer: Self-pay

## 2017-12-30 ENCOUNTER — Encounter (HOSPITAL_COMMUNITY)
Admission: RE | Admit: 2017-12-30 | Discharge: 2017-12-30 | Disposition: A | Discharge status: Home / Self Care | Payer: PPO | Source: Ambulatory Visit | Attending: General Surgery | Admitting: General Surgery

## 2017-12-30 ENCOUNTER — Encounter (HOSPITAL_COMMUNITY): Payer: Self-pay

## 2017-12-30 DIAGNOSIS — Z01812 Encounter for preprocedural laboratory examination: Secondary | ICD-10-CM | POA: Insufficient documentation

## 2017-12-30 LAB — URINALYSIS, ROUTINE W REFLEX MICROSCOPIC
BILIRUBIN URINE: NEGATIVE
Glucose, UA: NEGATIVE mg/dL
Ketones, ur: NEGATIVE mg/dL
LEUKOCYTES UA: NEGATIVE
NITRITE: NEGATIVE
PH: 7 (ref 5.0–8.0)
Protein, ur: 30 mg/dL — AB
SPECIFIC GRAVITY, URINE: 1.018 (ref 1.005–1.030)

## 2017-12-30 LAB — PROTIME-INR
INR: 1.04
Prothrombin Time: 13.6 seconds (ref 11.4–15.2)

## 2017-12-30 LAB — COMPREHENSIVE METABOLIC PANEL
ALT: 11 U/L — ABNORMAL LOW (ref 14–54)
AST: 18 U/L (ref 15–41)
Albumin: 3.8 g/dL (ref 3.5–5.0)
Alkaline Phosphatase: 95 U/L (ref 38–126)
Anion gap: 11 (ref 5–15)
BILIRUBIN TOTAL: 0.7 mg/dL (ref 0.3–1.2)
BUN: 7 mg/dL (ref 6–20)
CO2: 23 mmol/L (ref 22–32)
Calcium: 9.5 mg/dL (ref 8.9–10.3)
Chloride: 105 mmol/L (ref 101–111)
Creatinine, Ser: 0.74 mg/dL (ref 0.44–1.00)
Glucose, Bld: 129 mg/dL — ABNORMAL HIGH (ref 65–99)
POTASSIUM: 2.8 mmol/L — AB (ref 3.5–5.1)
Sodium: 139 mmol/L (ref 135–145)
TOTAL PROTEIN: 7.8 g/dL (ref 6.5–8.1)

## 2017-12-30 LAB — CBC WITH DIFFERENTIAL/PLATELET
BASOS ABS: 0 10*3/uL (ref 0.0–0.1)
Basophils Relative: 0 %
EOS PCT: 1 %
Eosinophils Absolute: 0.1 10*3/uL (ref 0.0–0.7)
HEMATOCRIT: 36.4 % (ref 36.0–46.0)
Hemoglobin: 12.2 g/dL (ref 12.0–15.0)
LYMPHS ABS: 2.2 10*3/uL (ref 0.7–4.0)
Lymphocytes Relative: 29 %
MCH: 29.5 pg (ref 26.0–34.0)
MCHC: 33.5 g/dL (ref 30.0–36.0)
MCV: 88.1 fL (ref 78.0–100.0)
MONO ABS: 0.4 10*3/uL (ref 0.1–1.0)
MONOS PCT: 5 %
NEUTROS ABS: 5 10*3/uL (ref 1.7–7.7)
Neutrophils Relative %: 65 %
PLATELETS: 373 10*3/uL (ref 150–400)
RBC: 4.13 MIL/uL (ref 3.87–5.11)
RDW: 14.3 % (ref 11.5–15.5)
WBC: 7.6 10*3/uL (ref 4.0–10.5)

## 2017-12-30 LAB — HEMOGLOBIN A1C
HEMOGLOBIN A1C: 4.5 % — AB (ref 4.8–5.6)
MEAN PLASMA GLUCOSE: 82.45 mg/dL

## 2017-12-30 LAB — PREPARE RBC (CROSSMATCH)

## 2017-12-30 NOTE — Pre-Procedure Instructions (Signed)
Lauren Richard  12/30/2017      [facility] - [place] ([place]), [place] [address] [place] [address] Phone: [phone] Fax: [phone]    Your procedure is scheduled on January 05, 2018, Thursday.   Report to [facility] Admitting at 530 AM.   Call this number if you have problems the morning of surgery:  [phone]   Remember:  Do not eat food or drink liquids after midnight, Wednesday.   Take these medicines the morning of surgery with A SIP OF WATER; atenolol (tenormin) tylenol-if needed amlodipine (norvasc),                        Please finish drinking your Ensure pre-surgery drink before leaving your house the morning of surgery.  It is best to drink in one sitting, and not to sip.  7 days prior to surgery STOP taking any Aspirin (unless otherwise instructed by your surgeon), Aleve, Naproxen, Ibuprofen, Motrin, Advil, Goody's, BC's, all herbal medications, fish oil, and all vitamins  Continue all other medications as instructed by your physician except follow the above medication instructions before surgery   Do not wear jewelry, make-up or nail polish.  Do not wear lotions, powders, or perfumes, or deodorant.  Do not shave 48 hours prior to surgery.    Do not bring valuables to the hospital.  [facility] is not responsible for any belongings or valuables.  Contacts, dentures or bridgework may not be worn into surgery.  Leave your suitcase in the car.  After surgery it may be brought to your room.  For patients admitted to the hospital, discharge time will be determined by your treatment team.  Patients discharged the day of surgery will not be allowed to drive home.   [place]- Preparing For Surgery  Before surgery, you can play an important role. Because skin is not sterile, your skin needs to be as free of germs as possible. You can reduce the number of germs on your skin by washing with CHG  (chlorahexidine gluconate) Soap before surgery.  CHG is an antiseptic cleaner which kills germs and bonds with the skin to continue killing germs even after washing.  Please do not use if you have an allergy to CHG or antibacterial soaps. If your skin becomes reddened/irritated stop using the CHG.  Do not shave (including legs and underarms) for at least 48 hours prior to first CHG shower. It is OK to shave your face.  Please follow these instructions carefully.   1. Shower the NIGHT BEFORE SURGERY and the MORNING OF SURGERY with CHG.   2. If you chose to wash your hair, wash your hair first as usual with your normal shampoo.  3. After you shampoo, rinse your hair and body thoroughly to remove the shampoo.  4. Use CHG as you would any other liquid soap. You can apply CHG directly to the skin and wash gently with a scrungie or a clean washcloth.   5. Apply the CHG Soap to your body ONLY FROM THE NECK DOWN.  Do not use on open wounds or open sores. Avoid contact with your eyes, ears, mouth and genitals (private parts). Wash Face and genitals (private parts)  with your normal soap.  6. Wash thoroughly, paying special attention to the area where your surgery will be performed.  7. Thoroughly rinse your body with warm water from the neck down.  8. DO NOT shower/wash with your normal soap after using and rinsing off the CHG Soap.  9. Pat yourself dry with a CLEAN TOWEL.  10. Wear CLEAN PAJAMAS to bed the night before surgery, wear comfortable clothes the morning of surgery  11. Place CLEAN SHEETS on your bed the night of your first shower and DO NOT SLEEP WITH PETS.   Day of Surgery: Do not apply any deodorants/lotions. Please wear clean clothes to the hospital/surgery center.    Please read over the following fact sheets that you were given. Pain Booklet, Coughing and Deep Breathing and Surgical Site Infection Prevention

## 2017-12-30 NOTE — Progress Notes (Signed)
PCP is Dr. Gertie Exon LOV 10/2017 Cardio is Dr. Berneice Gandy  LOV 10/2017 Echo 10/2017 She was first told she was in A-Fib, worked up, placed on Xarelto.  Then determined it was tachycardia with pac's. No longer takes xarelto.

## 2018-01-04 NOTE — H&P (Signed)
Lauren Richard Location: [facility] [facility] Patient #: [phone] DOB: 22-Jan-1936 Widowed / Language: English / Race: Black or African American Female   History of Present Illness  The patient is a 82 year old female who presents for a follow-up for Pancreatic cancer. Patient is a lovely 82 year old female who presented to the hospital in January with a 10-day history of nausea, vomiting, and anorexia. She was found to be jaundiced as well. She was tachycardic and dehydrated and originally they thought that she may have A. fib, however that was ruled out. Her workup included a chest CT for PE which was negative but an abdominal CT which was positive for a mass in the uncinate process of the pancreas. She underwent ERCP with stenting in order to get her bilirubin down. She had acute kidney injury as well from dehydration, and that has reversed. Because of her age and how she worked at that point, a metal stent was placed. However, since her discharge she has progressively been doing better and better and is now back to how she felt at the beginning of the year. She gets around well and does her own shopping. She also does her own housework. She denies diarrhea. She has not had any trouble with diabetes in the past. She denies any abdominal pain or back pain. She no longer has any difficulty with her appetite or eating.  Her mother had kidney cancer at age 21 and her father had lung cancer at age 19. Her father was a smoker. The patient has never used tobacco or alcohol.    CT abd/pelvis with contrast 12/01/2017 IMPRESSION: 1. 21 x 20 mm pancreatic head/uncinate process lesion without obvious involvement/invasion of the major surrounding vascular structures. 2. Common bile duct stent in place with associated pneumobilia but decompression of the biliary tree. 3. Borderline enlarged celiac axis and periportal lymph nodes. 4. No findings to suggest hepatic metastatic disease. 5. Advanced  aortic and branch vessel calcifications but no aneurysm or dissection. 6. Patchy right basilar atelectasis.  EUS 11/11/2017 A round mass was identified in the pancreatic head. The mass was hypoechoic. The mass measured 30 mm by 22 mm in maximal cross-sectional diameter. The outer margins were irregular. Fine needle aspiration for cytology was performed. Color Doppler imaging was utilized prior to needle puncture to confirm a lack of significant vascular structures within the needle path.  ERCP 11/04/2017 The major papilla appeared normal. - A segmental biliary stricture was found. The stricture was malignant appearing. - The entire biliary tree was severely dilated, with a mass causing an obstruction. - A biliary sphincterotomy was performed. - One covered metal stent was placed into the common bile duct.  CT chest PE protocol 10/18/2017 IMPRESSION: 1. No CT findings for pulmonary embolism. 2. Mild tortuosity, ectasia and calcification of the thoracic aorta but no dissection. 3. No acute pulmonary findings.  Aortic Atherosclerosis (ICD10-I70.0).     Cytology 11/11/2017 Diagnosis FINE NEEDLE ASPIRATION, ENDOSCOPIC, PANCREAS HEAD(SPECIMEN 1 OF 1 COLLECTED 11/11/17): ADENOCARCINOMA.  Labs 11/24/2017 CA 19-9 11 HCT 32.5 CMET K 3.1, Alb 3.1, Alk phos 187, T bili 2.2     Past Surgical History Knee Surgery  Left. Mammoplasty; Reduction  Bilateral.  Diagnostic Studies History  Colonoscopy  >10 years ago Mammogram  1-3 years ago  Allergies No Known Drug Allergies [12/06/2017]:  Medication History  Acetaminophen (500MG Capsule, Oral) Active. AmLODIPine Besylate (5MG Tablet, Oral) Active. Aspirin (81MG Tablet, Oral) Active. Atenolol (25MG Tablet, Oral) Active. Cholecalciferol (2000UNIT Capsule, Oral) Active.  Lidocaine-Prilocaine (2.5-2.5% Cream, External) Active. Potassium Chloride ER (10MEQ Tablet ER, Oral) Active. Medications Reconciled  Family  History Arthritis  Father, Mother, Sister. Breast Cancer  Mother. Cerebrovascular Accident  Sister. Diabetes Mellitus  Sister. Heart Disease  Sister. Hypertension  Sister. Respiratory Condition  Father, Sister.  Pregnancy / Birth History  Age at menarche  50 years. Age of menopause  <47 Gravida  1 Maternal age  57-20 Para  0  Other Problems Arthritis  Back Pain  High blood pressure  Hypercholesterolemia     Review of Systems General Not Present- Appetite Loss, Chills, Fatigue, Fever, Night Sweats, Weight Gain and Weight Loss. Skin Not Present- Change in Wart/Mole, Dryness, Hives, Jaundice, New Lesions, Non-Healing Wounds, Rash and Ulcer. HEENT Present- Hearing Loss and Wears glasses/contact lenses. Not Present- Earache, Hoarseness, Nose Bleed, Oral Ulcers, Ringing in the Ears, Seasonal Allergies, Sinus Pain, Sore Throat, Visual Disturbances and Yellow Eyes. Breast Not Present- Breast Mass, Breast Pain, Nipple Discharge and Skin Changes. Cardiovascular Not Present- Chest Pain, Difficulty Breathing Lying Down, Leg Cramps, Palpitations, Rapid Heart Rate, Shortness of Breath and Swelling of Extremities. Gastrointestinal Present- Excessive gas. Not Present- Abdominal Pain, Bloating, Bloody Stool, Change in Bowel Habits, Chronic diarrhea, Constipation, Difficulty Swallowing, Gets full quickly at meals, Hemorrhoids, Indigestion, Nausea, Rectal Pain and Vomiting. Musculoskeletal Present- Back Pain. Not Present- Joint Pain, Joint Stiffness, Muscle Pain, Muscle Weakness and Swelling of Extremities. Neurological Not Present- Decreased Memory, Fainting, Headaches, Numbness, Seizures, Tingling, Tremor, Trouble walking and Weakness. Psychiatric Present- Change in Sleep Pattern. Not Present- Anxiety, Bipolar, Depression, Fearful and Frequent crying. Endocrine Not Present- Cold Intolerance, Excessive Hunger, Hair Changes, Heat Intolerance, Hot flashes and New Diabetes. Hematology  Not Present- Blood Thinners, Easy Bruising, Excessive bleeding, Gland problems, HIV and Persistent Infections.  Vitals Weight: 5.44 lb Height: 67in Body Surface Area: 0.44 m Body Mass Index: 0.85 kg/m  Temp.: 98.53F(Oral)  Pulse: 68 (Regular)        Physical Exam  General Mental Status-Alert. General Appearance-Consistent with stated age. Hydration-Well hydrated. Voice-Normal.  Head and Neck Head-normocephalic, atraumatic with no lesions or palpable masses. Trachea-midline. Thyroid Gland Characteristics - normal size and consistency.  Eye Eyeball - Bilateral-Extraocular movements intact. Sclera/Conjunctiva - Bilateral-No scleral icterus.  Chest and Lung Exam Chest and lung exam reveals -quiet, even and easy respiratory effort with no use of accessory muscles and on auscultation, normal breath sounds, no adventitious sounds and normal vocal resonance. Inspection Chest Wall - Normal. Back - normal.  Cardiovascular Cardiovascular examination reveals -normal heart sounds, regular rate and rhythm with no murmurs and normal pedal pulses bilaterally.  Abdomen Inspection Inspection of the abdomen reveals - No Hernias. Palpation/Percussion Palpation and Percussion of the abdomen reveal - Soft, Non Tender, No Rebound tenderness, No Rigidity (guarding) and No hepatosplenomegaly. Auscultation Auscultation of the abdomen reveals - Bowel sounds normal.  Neurologic Neurologic evaluation reveals -alert and oriented x 3 with no impairment of recent or remote memory. Mental Status-Normal.  Musculoskeletal Global Assessment -Note: no gross deformities.  Normal Exam - Left-Upper Extremity Strength Normal and Lower Extremity Strength Normal. Normal Exam - Right-Upper Extremity Strength Normal and Lower Extremity Strength Normal.  Lymphatic Head & Neck  General Head & Neck Lymphatics: Bilateral - Description -  Normal. Axillary  General Axillary Region: Bilateral - Description - Normal. Tenderness - Non Tender. Femoral & Inguinal  Generalized Femoral & Inguinal Lymphatics: Bilateral - Description - No Generalized lymphadenopathy.     ADENOCARCINOMA OF HEAD OF PANCREAS (C25.0) Impression: This patient does  unfortunately have a new diagnosis of pancreatic adenocarcinoma. This will require a pancreaticoduodenectomy for excision. The patient is in very good shape and only took about 2 weeks to rebound from her acute illness caused by her jaundice. She is very energetic. I discussed the procedure with the patient and her niece and nephew. The patient does desire to proceed. I was quite frank about the length of time of surgery as well as what to expect with postoperative recovery. She does feel like she would prefer to go this route rather than other treatments which would be less likely to be effective. She understands there is a risk of recurrence from pancreatic cancer despite maximal therapy. She would like to do surgery as soon as possible. I discussed the surgery with the patient including diagrams of anatomy. I discussed the potential for diagnostic laparoscopy. In the case of pancreatic cancer, if spread of the disease is found, we will abort the procedure and not proceed with resection. The rationale for this was discussed with the patient. There has not been data to support resection of Stage IV disease in terms of survival benefit.  We discussed possible complications including: Potential of aborting procedure if tumor is invading the superior mesenteric or hepatic arteries Bleeding Infection and possible wound complications such as hernia Damage to adjacent structures Leak of anastamoses, primarily pancreatic Possible need for other procedures Possible prolonged nausea with possible need for external feeding. Possible prolonged hospital stay. Possible development of diabetes or worsening of  current diabetes. Possible pancreatic exocrine insufficiency Prolonged fatigue/weakness/appetite Possible early recurrence of cancer   The patient understands and wishes to proceed. Current Plans You are being scheduled for surgery- Our schedulers will call you.  You should hear from our office's scheduling department within 5 working days about the location, date, and time of surgery. We try to make accommodations for patient's preferences in scheduling surgery, but sometimes the OR schedule or the surgeon's schedule prevents [place] from making those accommodations.  If you have not heard from our office [phone]) in 5 working days, call the office and ask for your surgeon's nurse.  If you have other questions about your diagnosis, plan, or surgery, call the office and ask for your surgeon's nurse.  Pt Education - flb whipple pt info Referred to Genetic Counseling, for evaluation and follow up [employer]). Routine.   Signed by Stark Klein, MD

## 2018-01-04 NOTE — Anesthesia Preprocedure Evaluation (Addendum)
Anesthesia Evaluation  Patient identified by MRN, date of birth, ID band Patient awake    Reviewed: Allergy & Precautions, H&P , NPO status , Patient's Chart, lab work & pertinent test results, reviewed documented beta blocker date and time   Airway Mallampati: II  TM Distance: >3 FB Neck ROM: Full    Dental no notable dental hx. (+) Edentulous Upper, Edentulous Lower, Dental Advisory Given   Pulmonary neg pulmonary ROS,    Pulmonary exam normal breath sounds clear to auscultation       Cardiovascular hypertension, Pt. on medications and Pt. on home beta blockers  Rhythm:Regular Rate:Normal     Neuro/Psych negative neurological ROS  negative psych ROS   GI/Hepatic negative GI ROS, Neg liver ROS,   Endo/Other  Hypothyroidism Pancreatic CA  Renal/GU negative Renal ROS  negative genitourinary   Musculoskeletal  (+) Arthritis , Osteoarthritis,    Abdominal   Peds  Hematology negative hematology ROS (+) anemia ,   Anesthesia Other Findings   Reproductive/Obstetrics negative OB ROS                            Anesthesia Physical Anesthesia Plan  ASA: III  Anesthesia Plan: General and Epidural   Post-op Pain Management:    Induction: Intravenous  PONV Risk Score and Plan: 3 and Ondansetron, Dexamethasone and Treatment may vary due to age or medical condition  Airway Management Planned: Oral ETT  Additional Equipment: Arterial line, CVP and Ultrasound Guidance Line Placement  Intra-op Plan:   Post-operative Plan: Extubation in OR  Informed Consent: I have reviewed the patients History and Physical, chart, labs and discussed the procedure including the risks, benefits and alternatives for the proposed anesthesia with the patient or authorized representative who has indicated his/her understanding and acceptance.   Dental advisory given  Plan Discussed with: CRNA  Anesthesia Plan  Comments:        Anesthesia Quick Evaluation

## 2018-01-05 ENCOUNTER — Encounter (HOSPITAL_COMMUNITY)
Admission: RE | Disposition: A | Discharge status: Skilled Nursing Facility | Payer: Self-pay | Source: Ambulatory Visit | Attending: General Surgery

## 2018-01-05 ENCOUNTER — Encounter (HOSPITAL_COMMUNITY): Payer: Self-pay | Admitting: *Deleted

## 2018-01-05 ENCOUNTER — Inpatient Hospital Stay (HOSPITAL_COMMUNITY)
Admission: RE | Admit: 2018-01-05 | Discharge: 2018-01-14 | DRG: 406 | Disposition: A | Discharge status: Skilled Nursing Facility | Payer: PPO | Source: Ambulatory Visit | Attending: General Surgery | Admitting: General Surgery

## 2018-01-05 ENCOUNTER — Inpatient Hospital Stay (HOSPITAL_COMMUNITY): Payer: PPO

## 2018-01-05 DIAGNOSIS — N189 Chronic kidney disease, unspecified: Secondary | ICD-10-CM | POA: Diagnosis not present

## 2018-01-05 DIAGNOSIS — R509 Fever, unspecified: Secondary | ICD-10-CM

## 2018-01-05 DIAGNOSIS — Z8051 Family history of malignant neoplasm of kidney: Secondary | ICD-10-CM

## 2018-01-05 DIAGNOSIS — C801 Malignant (primary) neoplasm, unspecified: Secondary | ICD-10-CM | POA: Diagnosis not present

## 2018-01-05 DIAGNOSIS — Z833 Family history of diabetes mellitus: Secondary | ICD-10-CM | POA: Diagnosis not present

## 2018-01-05 DIAGNOSIS — C250 Malignant neoplasm of head of pancreas: Secondary | ICD-10-CM | POA: Diagnosis not present

## 2018-01-05 DIAGNOSIS — G8918 Other acute postprocedural pain: Secondary | ICD-10-CM | POA: Diagnosis not present

## 2018-01-05 DIAGNOSIS — Z7982 Long term (current) use of aspirin: Secondary | ICD-10-CM

## 2018-01-05 DIAGNOSIS — R41841 Cognitive communication deficit: Secondary | ICD-10-CM | POA: Diagnosis not present

## 2018-01-05 DIAGNOSIS — D62 Acute posthemorrhagic anemia: Secondary | ICD-10-CM | POA: Diagnosis present

## 2018-01-05 DIAGNOSIS — E039 Hypothyroidism, unspecified: Secondary | ICD-10-CM | POA: Diagnosis present

## 2018-01-05 DIAGNOSIS — N390 Urinary tract infection, site not specified: Secondary | ICD-10-CM | POA: Diagnosis present

## 2018-01-05 DIAGNOSIS — I10 Essential (primary) hypertension: Secondary | ICD-10-CM | POA: Diagnosis present

## 2018-01-05 DIAGNOSIS — R2681 Unsteadiness on feet: Secondary | ICD-10-CM | POA: Diagnosis not present

## 2018-01-05 DIAGNOSIS — I959 Hypotension, unspecified: Secondary | ICD-10-CM | POA: Diagnosis present

## 2018-01-05 DIAGNOSIS — E876 Hypokalemia: Secondary | ICD-10-CM | POA: Diagnosis not present

## 2018-01-05 DIAGNOSIS — K861 Other chronic pancreatitis: Secondary | ICD-10-CM | POA: Diagnosis present

## 2018-01-05 DIAGNOSIS — E7800 Pure hypercholesterolemia, unspecified: Secondary | ICD-10-CM | POA: Diagnosis present

## 2018-01-05 DIAGNOSIS — Z9889 Other specified postprocedural states: Secondary | ICD-10-CM

## 2018-01-05 DIAGNOSIS — D631 Anemia in chronic kidney disease: Secondary | ICD-10-CM | POA: Diagnosis not present

## 2018-01-05 DIAGNOSIS — R2689 Other abnormalities of gait and mobility: Secondary | ICD-10-CM | POA: Diagnosis not present

## 2018-01-05 DIAGNOSIS — B252 Cytomegaloviral pancreatitis: Secondary | ICD-10-CM | POA: Diagnosis not present

## 2018-01-05 DIAGNOSIS — Z801 Family history of malignant neoplasm of trachea, bronchus and lung: Secondary | ICD-10-CM | POA: Diagnosis not present

## 2018-01-05 DIAGNOSIS — Z48815 Encounter for surgical aftercare following surgery on the digestive system: Secondary | ICD-10-CM | POA: Diagnosis not present

## 2018-01-05 DIAGNOSIS — I129 Hypertensive chronic kidney disease with stage 1 through stage 4 chronic kidney disease, or unspecified chronic kidney disease: Secondary | ICD-10-CM | POA: Diagnosis not present

## 2018-01-05 DIAGNOSIS — K811 Chronic cholecystitis: Secondary | ICD-10-CM | POA: Diagnosis not present

## 2018-01-05 DIAGNOSIS — M6281 Muscle weakness (generalized): Secondary | ICD-10-CM | POA: Diagnosis not present

## 2018-01-05 DIAGNOSIS — R739 Hyperglycemia, unspecified: Secondary | ICD-10-CM | POA: Diagnosis present

## 2018-01-05 DIAGNOSIS — C772 Secondary and unspecified malignant neoplasm of intra-abdominal lymph nodes: Secondary | ICD-10-CM | POA: Diagnosis not present

## 2018-01-05 DIAGNOSIS — I493 Ventricular premature depolarization: Secondary | ICD-10-CM | POA: Diagnosis not present

## 2018-01-05 DIAGNOSIS — E440 Moderate protein-calorie malnutrition: Secondary | ICD-10-CM | POA: Diagnosis present

## 2018-01-05 DIAGNOSIS — Z6831 Body mass index (BMI) 31.0-31.9, adult: Secondary | ICD-10-CM

## 2018-01-05 DIAGNOSIS — R278 Other lack of coordination: Secondary | ICD-10-CM | POA: Diagnosis not present

## 2018-01-05 DIAGNOSIS — C258 Malignant neoplasm of overlapping sites of pancreas: Secondary | ICD-10-CM | POA: Diagnosis not present

## 2018-01-05 HISTORY — PX: LAPAROSCOPY: SHX197

## 2018-01-05 HISTORY — PX: WHIPPLE PROCEDURE: SHX2667

## 2018-01-05 LAB — POCT I-STAT 7, (LYTES, BLD GAS, ICA,H+H)
Acid-base deficit: 5 mmol/L — ABNORMAL HIGH (ref 0.0–2.0)
Bicarbonate: 20.1 mmol/L (ref 20.0–28.0)
CALCIUM ION: 1.29 mmol/L (ref 1.15–1.40)
HEMATOCRIT: 31 % — AB (ref 36.0–46.0)
Hemoglobin: 10.5 g/dL — ABNORMAL LOW (ref 12.0–15.0)
O2 SAT: 100 %
PH ART: 7.342 — AB (ref 7.350–7.450)
POTASSIUM: 3.9 mmol/L (ref 3.5–5.1)
Patient temperature: 35.8
SODIUM: 141 mmol/L (ref 135–145)
TCO2: 21 mmol/L — ABNORMAL LOW (ref 22–32)
pCO2 arterial: 36.6 mmHg (ref 32.0–48.0)
pO2, Arterial: 173 mmHg — ABNORMAL HIGH (ref 83.0–108.0)

## 2018-01-05 LAB — PREPARE RBC (CROSSMATCH)

## 2018-01-05 LAB — MRSA PCR SCREENING: MRSA BY PCR: NEGATIVE

## 2018-01-05 SURGERY — LAPAROSCOPY, DIAGNOSTIC
Anesthesia: Epidural | Site: Abdomen

## 2018-01-05 MED ORDER — DIPHENHYDRAMINE HCL 12.5 MG/5ML PO ELIX: 12.5000 mg | ORAL_SOLUTION | Freq: Four times a day (QID) | ORAL | Status: DC | PRN

## 2018-01-05 MED ORDER — ONDANSETRON HCL 4 MG/2ML IJ SOLN
4.0000 mg | Freq: Four times a day (QID) | INTRAMUSCULAR | Status: DC | PRN
  Administered 2018-01-10: 4 mg via INTRAVENOUS
  Filled 2018-01-05: qty 2

## 2018-01-05 MED ORDER — DEXAMETHASONE SODIUM PHOSPHATE 10 MG/ML IJ SOLN
INTRAMUSCULAR | Status: DC | PRN
  Administered 2018-01-05: 10 mg via INTRAVENOUS

## 2018-01-05 MED ORDER — SUGAMMADEX SODIUM 200 MG/2ML IV SOLN
INTRAVENOUS | Status: DC | PRN
  Administered 2018-01-05: 200 mg via INTRAVENOUS

## 2018-01-05 MED ORDER — ROPIVACAINE HCL 2 MG/ML IJ SOLN
INTRAMUSCULAR | Status: DC | PRN
  Administered 2018-01-05: 10 mL/h via EPIDURAL

## 2018-01-05 MED ORDER — FENTANYL CITRATE (PF) 100 MCG/2ML IJ SOLN
INTRAMUSCULAR | Status: DC | PRN
  Administered 2018-01-05 (×3): 50 ug via INTRAVENOUS

## 2018-01-05 MED ORDER — SCOPOLAMINE 1 MG/3DAYS TD PT72: 1.0000 | MEDICATED_PATCH | TRANSDERMAL | Status: DC

## 2018-01-05 MED ORDER — LIDOCAINE HCL 1 % IJ SOLN
INTRAMUSCULAR | Status: DC | PRN
  Administered 2018-01-05: 5 mL

## 2018-01-05 MED ORDER — GABAPENTIN 300 MG PO CAPS
ORAL_CAPSULE | ORAL | Status: AC
  Administered 2018-01-05: 300 mg
  Filled 2018-01-05: qty 1

## 2018-01-05 MED ORDER — CHLORHEXIDINE GLUCONATE CLOTH 2 % EX PADS: 6.0000 | MEDICATED_PAD | Freq: Once | CUTANEOUS | Status: DC

## 2018-01-05 MED ORDER — MIDAZOLAM HCL 2 MG/2ML IJ SOLN
INTRAMUSCULAR | Status: AC
  Administered 2018-01-05: 2 mg via INTRAVENOUS
  Filled 2018-01-05: qty 2

## 2018-01-05 MED ORDER — SCOPOLAMINE 1 MG/3DAYS TD PT72
MEDICATED_PATCH | TRANSDERMAL | Status: AC
  Administered 2018-01-05: 1.5 mg
  Filled 2018-01-05: qty 1

## 2018-01-05 MED ORDER — PANTOPRAZOLE SODIUM 40 MG IV SOLR
40.0000 mg | Freq: Every day | INTRAVENOUS | Status: DC
  Administered 2018-01-05 – 2018-01-10 (×6): 40 mg via INTRAVENOUS
  Filled 2018-01-05 (×6): qty 40

## 2018-01-05 MED ORDER — ACETAMINOPHEN 500 MG PO TABS: 1000.0000 mg | ORAL_TABLET | ORAL | Status: DC

## 2018-01-05 MED ORDER — EVICEL 5 ML EX KIT
PACK | CUTANEOUS | Status: DC | PRN
  Administered 2018-01-05: 5 mL

## 2018-01-05 MED ORDER — HYDRALAZINE HCL 20 MG/ML IJ SOLN
10.0000 mg | INTRAMUSCULAR | Status: DC | PRN
  Administered 2018-01-06: 10 mg via INTRAVENOUS
  Filled 2018-01-05: qty 1

## 2018-01-05 MED ORDER — DEXAMETHASONE SODIUM PHOSPHATE 10 MG/ML IJ SOLN
INTRAMUSCULAR | Status: AC
  Filled 2018-01-05: qty 1

## 2018-01-05 MED ORDER — METHOCARBAMOL 1000 MG/10ML IJ SOLN
500.0000 mg | Freq: Three times a day (TID) | INTRAMUSCULAR | Status: DC | PRN
  Administered 2018-01-06: 500 mg via INTRAVENOUS
  Filled 2018-01-05: qty 5
  Filled 2018-01-05: qty 550

## 2018-01-05 MED ORDER — PHENYLEPHRINE 40 MCG/ML (10ML) SYRINGE FOR IV PUSH (FOR BLOOD PRESSURE SUPPORT)
PREFILLED_SYRINGE | INTRAVENOUS | Status: AC
  Filled 2018-01-05: qty 10

## 2018-01-05 MED ORDER — FENTANYL CITRATE (PF) 250 MCG/5ML IJ SOLN
INTRAMUSCULAR | Status: AC
  Filled 2018-01-05: qty 5

## 2018-01-05 MED ORDER — ACETAMINOPHEN 10 MG/ML IV SOLN
1000.0000 mg | Freq: Four times a day (QID) | INTRAVENOUS | Status: AC
  Administered 2018-01-05 – 2018-01-06 (×4): 1000 mg via INTRAVENOUS
  Filled 2018-01-05 (×4): qty 100

## 2018-01-05 MED ORDER — DEXTROSE 5 % IV SOLN
INTRAVENOUS | Status: DC | PRN
  Administered 2018-01-05: 25 ug/min via INTRAVENOUS

## 2018-01-05 MED ORDER — LACTATED RINGERS IV SOLN
INTRAVENOUS | Status: DC | PRN
  Administered 2018-01-05: 07:00:00 via INTRAVENOUS

## 2018-01-05 MED ORDER — DIPHENHYDRAMINE HCL 50 MG/ML IJ SOLN: 12.5000 mg | Freq: Four times a day (QID) | INTRAMUSCULAR | Status: DC | PRN

## 2018-01-05 MED ORDER — KCL IN DEXTROSE-NACL 20-5-0.45 MEQ/L-%-% IV SOLN
INTRAVENOUS | Status: DC
  Administered 2018-01-05 – 2018-01-14 (×13): via INTRAVENOUS
  Filled 2018-01-05 (×14): qty 1000

## 2018-01-05 MED ORDER — ONDANSETRON HCL 4 MG/2ML IJ SOLN
INTRAMUSCULAR | Status: AC
  Filled 2018-01-05: qty 2

## 2018-01-05 MED ORDER — ROPIVACAINE HCL 2 MG/ML IJ SOLN
10.0000 mL/h | INTRAMUSCULAR | Status: DC
  Administered 2018-01-05: 10 mL/h via EPIDURAL
  Administered 2018-01-06: 9 mL/h via EPIDURAL
  Administered 2018-01-06 – 2018-01-08 (×4): 10 mL/h via EPIDURAL
  Filled 2018-01-05 (×9): qty 200

## 2018-01-05 MED ORDER — PROMETHAZINE HCL 25 MG/ML IJ SOLN: 6.2500 mg | INTRAMUSCULAR | Status: DC | PRN

## 2018-01-05 MED ORDER — CHLORHEXIDINE GLUCONATE 0.12 % MT SOLN
15.0000 mL | Freq: Two times a day (BID) | OROMUCOSAL | Status: DC
  Administered 2018-01-06 – 2018-01-14 (×18): 15 mL via OROMUCOSAL
  Filled 2018-01-05 (×13): qty 15

## 2018-01-05 MED ORDER — CEFAZOLIN SODIUM-DEXTROSE 2-4 GM/100ML-% IV SOLN
INTRAVENOUS | Status: AC
  Filled 2018-01-05: qty 100

## 2018-01-05 MED ORDER — GABAPENTIN 300 MG PO CAPS: 300.0000 mg | ORAL_CAPSULE | ORAL | Status: DC

## 2018-01-05 MED ORDER — PHENYLEPHRINE HCL 10 MG/ML IJ SOLN
INTRAVENOUS | Status: DC | PRN
  Administered 2018-01-05: 25 ug/min via INTRAVENOUS

## 2018-01-05 MED ORDER — ONDANSETRON HCL 4 MG/2ML IJ SOLN
INTRAMUSCULAR | Status: DC | PRN
  Administered 2018-01-05: 4 mg via INTRAVENOUS

## 2018-01-05 MED ORDER — WATER FOR IRRIGATION, STERILE IR SOLN
Status: DC | PRN
  Administered 2018-01-05: 1000 mL

## 2018-01-05 MED ORDER — ORAL CARE MOUTH RINSE
15.0000 mL | Freq: Two times a day (BID) | OROMUCOSAL | Status: DC
  Administered 2018-01-07 – 2018-01-12 (×6): 15 mL via OROMUCOSAL

## 2018-01-05 MED ORDER — PHENYLEPHRINE HCL 10 MG/ML IJ SOLN
INTRAMUSCULAR | Status: DC | PRN
  Administered 2018-01-05 (×2): 80 ug via INTRAVENOUS

## 2018-01-05 MED ORDER — ROPIVACAINE HCL 2 MG/ML IJ SOLN: 2.0000 mL/h | INTRAMUSCULAR | Status: DC

## 2018-01-05 MED ORDER — PROPOFOL 10 MG/ML IV BOLUS
INTRAVENOUS | Status: DC | PRN
  Administered 2018-01-05: 70 mg via INTRAVENOUS

## 2018-01-05 MED ORDER — SODIUM CHLORIDE 0.9 % IV SOLN: Freq: Once | INTRAVENOUS | Status: DC

## 2018-01-05 MED ORDER — FENTANYL CITRATE (PF) 100 MCG/2ML IJ SOLN
INTRAMUSCULAR | Status: AC
  Administered 2018-01-05: 100 ug via INTRAVENOUS
  Filled 2018-01-05: qty 2

## 2018-01-05 MED ORDER — FENTANYL CITRATE (PF) 100 MCG/2ML IJ SOLN
25.0000 ug | INTRAMUSCULAR | Status: DC | PRN
  Administered 2018-01-06 (×2): 50 ug via INTRAVENOUS
  Administered 2018-01-06: 25 ug via INTRAVENOUS
  Filled 2018-01-05 (×3): qty 2

## 2018-01-05 MED ORDER — ONDANSETRON 4 MG PO TBDP: 4.0000 mg | ORAL_TABLET | Freq: Four times a day (QID) | ORAL | Status: DC | PRN

## 2018-01-05 MED ORDER — MIDAZOLAM HCL 2 MG/2ML IJ SOLN
2.0000 mg | Freq: Once | INTRAMUSCULAR | Status: AC
  Administered 2018-01-05: 2 mg via INTRAVENOUS

## 2018-01-05 MED ORDER — PROPOFOL 10 MG/ML IV BOLUS
INTRAVENOUS | Status: AC
  Filled 2018-01-05: qty 20

## 2018-01-05 MED ORDER — ACETAMINOPHEN 500 MG PO TABS
ORAL_TABLET | ORAL | Status: AC
  Administered 2018-01-05: 1000 mg
  Filled 2018-01-05: qty 2

## 2018-01-05 MED ORDER — HYDROMORPHONE HCL 1 MG/ML IJ SOLN: 0.2500 mg | INTRAMUSCULAR | Status: DC | PRN

## 2018-01-05 MED ORDER — BUPIVACAINE-EPINEPHRINE (PF) 0.25% -1:200000 IJ SOLN
INTRAMUSCULAR | Status: AC
  Filled 2018-01-05: qty 30

## 2018-01-05 MED ORDER — FENTANYL CITRATE (PF) 100 MCG/2ML IJ SOLN
100.0000 ug | Freq: Once | INTRAMUSCULAR | Status: AC
  Administered 2018-01-05: 100 ug via INTRAVENOUS

## 2018-01-05 MED ORDER — METOPROLOL TARTRATE 5 MG/5ML IV SOLN
5.0000 mg | Freq: Three times a day (TID) | INTRAVENOUS | Status: AC
  Administered 2018-01-05 – 2018-01-06 (×2): 5 mg via INTRAVENOUS
  Filled 2018-01-05 (×3): qty 5

## 2018-01-05 MED ORDER — EVICEL 5 ML EX KIT
PACK | CUTANEOUS | Status: AC
  Filled 2018-01-05: qty 1

## 2018-01-05 MED ORDER — ROCURONIUM BROMIDE 100 MG/10ML IV SOLN
INTRAVENOUS | Status: DC | PRN
  Administered 2018-01-05: 20 mg via INTRAVENOUS
  Administered 2018-01-05: 60 mg via INTRAVENOUS
  Administered 2018-01-05 (×2): 20 mg via INTRAVENOUS
  Administered 2018-01-05: 30 mg via INTRAVENOUS

## 2018-01-05 MED ORDER — LIDOCAINE HCL (PF) 1 % IJ SOLN
INTRAMUSCULAR | Status: AC
  Filled 2018-01-05: qty 30

## 2018-01-05 MED ORDER — LIDOCAINE HCL (CARDIAC) 20 MG/ML IV SOLN
INTRAVENOUS | Status: AC
  Filled 2018-01-05: qty 5

## 2018-01-05 MED ORDER — CEFAZOLIN SODIUM-DEXTROSE 2-4 GM/100ML-% IV SOLN
2.0000 g | INTRAVENOUS | Status: AC
  Administered 2018-01-05 (×2): 2 g via INTRAVENOUS

## 2018-01-05 MED ORDER — CEFAZOLIN SODIUM-DEXTROSE 2-4 GM/100ML-% IV SOLN
2.0000 g | Freq: Three times a day (TID) | INTRAVENOUS | Status: AC
  Administered 2018-01-05: 2 g via INTRAVENOUS
  Filled 2018-01-05: qty 100

## 2018-01-05 MED ORDER — ROCURONIUM BROMIDE 10 MG/ML (PF) SYRINGE
PREFILLED_SYRINGE | INTRAVENOUS | Status: AC
  Filled 2018-01-05: qty 5

## 2018-01-05 MED ORDER — 0.9 % SODIUM CHLORIDE (POUR BTL) OPTIME
TOPICAL | Status: DC | PRN
  Administered 2018-01-05 (×2): 1000 mL

## 2018-01-05 MED ORDER — MEPERIDINE HCL 50 MG/ML IJ SOLN: 6.2500 mg | INTRAMUSCULAR | Status: DC | PRN

## 2018-01-05 MED ORDER — ALBUMIN HUMAN 5 % IV SOLN
INTRAVENOUS | Status: DC | PRN
  Administered 2018-01-05 (×2): via INTRAVENOUS

## 2018-01-05 SURGICAL SUPPLY — 105 items
BAG BILE T-TUBES STRL (MISCELLANEOUS) ×6 IMPLANT
BLADE CLIPPER SURG (BLADE) IMPLANT
BOOT SUTURE AID YELLOW STND (SUTURE) ×6 IMPLANT
CANISTER SUCT 3000ML PPV (MISCELLANEOUS) ×3 IMPLANT
CATH KIT ON Q 7.5IN SLV (PAIN MANAGEMENT) IMPLANT
CATH ROBINSON RED A/P 16FR (CATHETERS) IMPLANT
CHLORAPREP W/TINT 26ML (MISCELLANEOUS) ×3 IMPLANT
CLIP VESOCCLUDE LG 6/CT (CLIP) ×3 IMPLANT
CLIP VESOCCLUDE MED 24/CT (CLIP) ×3 IMPLANT
CLIP VESOLOCK LG 6/CT PURPLE (CLIP) ×3 IMPLANT
CLIP VESOLOCK MED 6/CT (CLIP) ×6 IMPLANT
CLIP VESOLOCK MED LG 6/CT (CLIP) ×3 IMPLANT
CONT SPEC 4OZ CLIKSEAL STRL BL (MISCELLANEOUS) IMPLANT
COVER MAYO STAND STRL (DRAPES) ×3 IMPLANT
COVER SURGICAL LIGHT HANDLE (MISCELLANEOUS) ×3 IMPLANT
DECANTER SPIKE VIAL GLASS SM (MISCELLANEOUS) ×3 IMPLANT
DERMABOND ADVANCED (GAUZE/BANDAGES/DRESSINGS)
DERMABOND ADVANCED .7 DNX12 (GAUZE/BANDAGES/DRESSINGS) IMPLANT
DRAIN CHANNEL 19F RND (DRAIN) ×6 IMPLANT
DRAIN PENROSE 1/2X36 STERILE (WOUND CARE) IMPLANT
DRAPE LAPAROSCOPIC ABDOMINAL (DRAPES) ×3 IMPLANT
DRAPE WARM FLUID 44X44 (DRAPE) ×3 IMPLANT
DRSG COVADERM 4X10 (GAUZE/BANDAGES/DRESSINGS) IMPLANT
DRSG COVADERM 4X14 (GAUZE/BANDAGES/DRESSINGS) ×3 IMPLANT
DRSG COVADERM 4X6 (GAUZE/BANDAGES/DRESSINGS) ×3 IMPLANT
DRSG COVADERM 4X8 (GAUZE/BANDAGES/DRESSINGS) IMPLANT
DRSG TELFA 3X8 NADH (GAUZE/BANDAGES/DRESSINGS) IMPLANT
ELECT BLADE 4.0 EZ CLEAN MEGAD (MISCELLANEOUS) ×3
ELECT BLADE 6.5 EXT (BLADE) ×3 IMPLANT
ELECT CAUTERY BLADE 6.4 (BLADE) ×3 IMPLANT
ELECT REM PT RETURN 9FT ADLT (ELECTROSURGICAL) ×3
ELECTRODE BLDE 4.0 EZ CLN MEGD (MISCELLANEOUS) ×1 IMPLANT
ELECTRODE REM PT RTRN 9FT ADLT (ELECTROSURGICAL) ×1 IMPLANT
GAUZE SPONGE 4X4 12PLY STRL (GAUZE/BANDAGES/DRESSINGS) ×12 IMPLANT
GAUZE SPONGE 4X4 16PLY XRAY LF (GAUZE/BANDAGES/DRESSINGS) ×3 IMPLANT
GEL ULTRASOUND 20GR AQUASONIC (MISCELLANEOUS) IMPLANT
GLOVE BIO SURGEON STRL SZ 6 (GLOVE) ×9 IMPLANT
GLOVE INDICATOR 6.5 STRL GRN (GLOVE) ×9 IMPLANT
GOWN STRL REUS W/ TWL LRG LVL3 (GOWN DISPOSABLE) ×2 IMPLANT
GOWN STRL REUS W/TWL 2XL LVL3 (GOWN DISPOSABLE) ×6 IMPLANT
GOWN STRL REUS W/TWL LRG LVL3 (GOWN DISPOSABLE) ×4
HEMOSTAT SURGICEL 2X14 (HEMOSTASIS) IMPLANT
KIT BASIN OR (CUSTOM PROCEDURE TRAY) ×3 IMPLANT
KIT MARKER MARGIN INK (KITS) ×3 IMPLANT
KIT TOURNIQUET VASCULAR (KITS) ×3 IMPLANT
KIT TURNOVER KIT B (KITS) ×3 IMPLANT
L-HOOK LAP DISP 36CM (ELECTROSURGICAL)
LHOOK LAP DISP 36CM (ELECTROSURGICAL) IMPLANT
LOOP VESSEL MAXI BLUE (MISCELLANEOUS) ×3 IMPLANT
LOOP VESSEL MINI RED (MISCELLANEOUS) ×3 IMPLANT
NEEDLE BIOPSY 14X6 SOFT TISS (NEEDLE) IMPLANT
NS IRRIG 1000ML POUR BTL (IV SOLUTION) ×6 IMPLANT
PACK GENERAL/GYN (CUSTOM PROCEDURE TRAY) ×3 IMPLANT
PAD ARMBOARD 7.5X6 YLW CONV (MISCELLANEOUS) ×3 IMPLANT
PAD SHARPS MAGNETIC DISPOSAL (MISCELLANEOUS) ×3 IMPLANT
PENCIL BUTTON HOLSTER BLD 10FT (ELECTRODE) IMPLANT
PLUG CATH AND CAP STER (CATHETERS) IMPLANT
RELOAD PROXIMATE 75MM BLUE (ENDOMECHANICALS) ×6 IMPLANT
RELOAD PROXIMATE 75MM GREEN (ENDOMECHANICALS) IMPLANT
SCISSORS LAP 5X35 DISP (ENDOMECHANICALS) IMPLANT
SEPRAFILM PROCEDURAL PACK 3X5 (MISCELLANEOUS) IMPLANT
SET IRRIG TUBING LAPAROSCOPIC (IRRIGATION / IRRIGATOR) IMPLANT
SHEARS FOC LG CVD HARMONIC 17C (MISCELLANEOUS) ×3 IMPLANT
SLEEVE ENDOPATH XCEL 5M (ENDOMECHANICALS) ×3 IMPLANT
SPONGE INTESTINAL PEANUT (DISPOSABLE) IMPLANT
SPONGE LAP 18X18 X RAY DECT (DISPOSABLE) ×6 IMPLANT
SPONGE SURGIFOAM ABS GEL 100 (HEMOSTASIS) IMPLANT
STAPLER PROXIMATE 75MM BLUE (STAPLE) ×3 IMPLANT
STAPLER VISISTAT 35W (STAPLE) ×3 IMPLANT
SUCTION POOLE TIP (SUCTIONS) ×3 IMPLANT
SUT 5.0 PDS RB-1 (SUTURE) ×12
SUT ETHILON 2 0 FS 18 (SUTURE) ×6 IMPLANT
SUT ETHILON 2 LR (SUTURE) IMPLANT
SUT MNCRL AB 4-0 PS2 18 (SUTURE) ×3 IMPLANT
SUT PDS AB 1 TP1 96 (SUTURE) ×9 IMPLANT
SUT PDS AB 3-0 SH 27 (SUTURE) ×12 IMPLANT
SUT PDS AB 4-0 RB1 27 (SUTURE) ×33 IMPLANT
SUT PDS PLUS AB 5-0 RB-1 (SUTURE) ×6 IMPLANT
SUT PROLENE 3 0 SH 48 (SUTURE) ×12 IMPLANT
SUT PROLENE 4 0 RB 1 (SUTURE) ×4
SUT PROLENE 4-0 RB1 .5 CRCL 36 (SUTURE) ×2 IMPLANT
SUT PROLENE 5 0 RB 1 DA (SUTURE) ×6 IMPLANT
SUT SILK 2 0 SH CR/8 (SUTURE) ×9 IMPLANT
SUT SILK 2 0 TIES 10X30 (SUTURE) ×3 IMPLANT
SUT SILK 3 0 SH CR/8 (SUTURE) ×3 IMPLANT
SUT SILK 3 0 TIES 10X30 (SUTURE) ×3 IMPLANT
SUT VIC AB 2-0 CT1 27 (SUTURE)
SUT VIC AB 2-0 CT1 TAPERPNT 27 (SUTURE) IMPLANT
SUT VIC AB 2-0 SH 18 (SUTURE) ×3 IMPLANT
SUT VIC AB 3-0 SH 18 (SUTURE) ×3 IMPLANT
SUT VIC AB 3-0 SH 27 (SUTURE) ×2
SUT VIC AB 3-0 SH 27X BRD (SUTURE) ×1 IMPLANT
SUT VICRYL AB 2 0 TIES (SUTURE) IMPLANT
TAPE UMBILICAL 1/8 X36 TWILL (MISCELLANEOUS) IMPLANT
TOWEL OR 17X24 6PK STRL BLUE (TOWEL DISPOSABLE) ×3 IMPLANT
TOWEL OR 17X26 10 PK STRL BLUE (TOWEL DISPOSABLE) ×3 IMPLANT
TRAY FOLEY W/METER SILVER 14FR (SET/KITS/TRAYS/PACK) ×12 IMPLANT
TRAY LAPAROSCOPIC MC (CUSTOM PROCEDURE TRAY) IMPLANT
TROCAR XCEL BLUNT TIP 100MML (ENDOMECHANICALS) IMPLANT
TROCAR XCEL NON-BLD 11X100MML (ENDOMECHANICALS) IMPLANT
TROCAR XCEL NON-BLD 5MMX100MML (ENDOMECHANICALS) ×3 IMPLANT
TUBE FEEDING 8FR 16IN STR KANG (MISCELLANEOUS) IMPLANT
TUBE FEEDING ENTERAL 5FR 16IN (TUBING) ×3 IMPLANT
TUBING INSUFFLATION (TUBING) ×3 IMPLANT
TUNNELER SHEATH ON-Q 16GX12 DP (PAIN MANAGEMENT) IMPLANT

## 2018-01-05 NOTE — Anesthesia Procedure Notes (Signed)
Epidural Patient location during procedure: pre-op Start time: 01/05/2018 7:15 AM End time: 01/05/2018 7:30 AM  Staffing Anesthesiologist: Roderic Palau, MD Performed: anesthesiologist   Preanesthetic Checklist Completed: patient identified, site marked, surgical consent, pre-op evaluation, timeout performed, IV checked, risks and benefits discussed and monitors and equipment checked  Epidural Patient position: sitting Prep: ChloraPrep and site prepped and draped Patient monitoring: continuous pulse ox, blood pressure, heart rate and cardiac monitor Approach: midline Location: thoracic (1-12) Injection technique: LOR air  Needle:  Needle type: Tuohy  Needle gauge: 17 G Needle length: 9 cm and 9 Needle insertion depth: 8 cm Catheter type: closed end flexible Catheter size: 19 Gauge Catheter at skin depth: 13 cm Test dose: negative and 1.5% lidocaine with Epi 1:200 K  Assessment Sensory level: T8  Additional Notes Attempted epidural placement at T7 with dural puncture. Moved up to T6 with successful placement of catheter.Reason for block:at surgeon's request and post-op pain management

## 2018-01-05 NOTE — Transfer of Care (Signed)
Immediate Anesthesia Transfer of Care Note  Patient: Lauren Richard  Procedure(s) Performed: LAPAROSCOPY DIAGNOSTIC ERAS PATHWAY (N/A Abdomen) WHIPPLE PROCEDURE (N/A Abdomen)  Patient Location: PACU  Anesthesia Type:GA combined with regional for post-op pain  Level of Consciousness: awake, alert  and oriented  Airway & Oxygen Therapy: Patient Spontanous Breathing and Patient connected to nasal cannula oxygen  Post-op Assessment: Report given to RN, Post -op Vital signs reviewed and stable and Patient moving all extremities X 4  Post vital signs: Reviewed and stable  Last Vitals:  Vitals Value Taken Time  BP 122/104 01/05/2018  1:29 PM  Temp    Pulse 70 01/05/2018  1:35 PM  Resp 21 01/05/2018  1:35 PM  SpO2 100 % 01/05/2018  1:35 PM  Vitals shown include unvalidated device data.  Last Pain:  Vitals:   01/05/18 0545  TempSrc: Oral      Patients Stated Pain Goal: 8 (18/84/16 6063)  Complications: No apparent anesthesia complications

## 2018-01-05 NOTE — Anesthesia Procedure Notes (Signed)
Procedure Name: Intubation Date/Time: 01/05/2018 7:54 AM Performed by: Inda Coke, CRNA Pre-anesthesia Checklist: Patient identified, Emergency Drugs available, Suction available and Patient being monitored Patient Re-evaluated:Patient Re-evaluated prior to induction Oxygen Delivery Method: Circle System Utilized Preoxygenation: Pre-oxygenation with 100% oxygen Induction Type: IV induction Ventilation: Mask ventilation without difficulty Laryngoscope Size: Mac and 3 Grade View: Grade I Tube type: Oral Tube size: 7.0 mm Number of attempts: 1 Airway Equipment and Method: Stylet and Oral airway Placement Confirmation: ETT inserted through vocal cords under direct vision,  positive ETCO2 and breath sounds checked- equal and bilateral Secured at: 20 cm Tube secured with: Tape Dental Injury: Teeth and Oropharynx as per pre-operative assessment  Comments: By Henderson [place] with no difficulties

## 2018-01-05 NOTE — Interval H&P Note (Signed)
History and Physical Interval Note:  01/05/2018 7:24 AM  Lauren Richard  has presented today for surgery, with the diagnosis of pancreatic cancer  The various methods of treatment have been discussed with the patient and family. After consideration of risks, benefits and other options for treatment, the patient has consented to  Procedure(s) with comments: [place] (N/A) - [place] (N/A) as a surgical intervention .  The patient's history has been reviewed, patient examined, no change in status, stable for surgery.  I have reviewed the patient's chart and labs.  Questions were answered to the patient's satisfaction.     Stark Klein

## 2018-01-05 NOTE — Anesthesia Procedure Notes (Signed)
Central Venous Catheter Insertion Performed by: Lillia Abed, MD, anesthesiologist Start/End3/28/2019 6:40 AM, 01/05/2018 6:50 AM Patient location: OR. Preanesthetic checklist: patient identified, IV checked, risks and benefits discussed, surgical consent, monitors and equipment checked, pre-op evaluation, timeout performed and anesthesia consent Position: Trendelenburg Lidocaine 1% used for infiltration and patient sedated Hand hygiene performed  and maximum sterile barriers used  Catheter size: 8 Fr Total catheter length 16. Central line was placed.Double lumen Procedure performed using ultrasound guided technique. Ultrasound Notes:anatomy identified, needle tip was noted to be adjacent to the nerve/plexus identified, no ultrasound evidence of intravascular and/or intraneural injection and image(s) printed for medical record Attempts: 1 Following insertion, dressing applied, line sutured and Biopatch. Post procedure assessment: blood return through all ports, free fluid flow and no air  Patient tolerated the procedure well with no immediate complications.

## 2018-01-05 NOTE — Anesthesia Procedure Notes (Signed)
Arterial Line Insertion Start/End3/28/2019 7:03 AM, 01/05/2018 7:05 AM Performed by: Inda Coke, CRNA, CRNA  Patient location: Pre-op. Preanesthetic checklist: patient identified, IV checked, site marked, risks and benefits discussed, surgical consent, monitors and equipment checked, pre-op evaluation, timeout performed and anesthesia consent Lidocaine 1% used for infiltration and patient sedated Left, radial was placed Catheter size: 20 G Hand hygiene performed  and maximum sterile barriers used  Allen's test indicative of satisfactory collateral circulation Attempts: 1 Procedure performed without using ultrasound guided technique. Ultrasound Notes:anatomy identified Following insertion, dressing applied and Biopatch. Post procedure assessment: normal  Patient tolerated the procedure well with no immediate complications.

## 2018-01-05 NOTE — Anesthesia Postprocedure Evaluation (Signed)
Anesthesia Post Note  Patient: [name]  Procedure(s) Performed: LAPAROSCOPY DIAGNOSTIC ERAS PATHWAY (N/A Abdomen) WHIPPLE PROCEDURE (N/A Abdomen)     Patient location during evaluation: PACU Anesthesia Type: Epidural and General Level of consciousness: awake and alert Pain management: pain level controlled Vital Signs Assessment: post-procedure vital signs reviewed and stable Respiratory status: spontaneous breathing, nonlabored ventilation, respiratory function stable and patient connected to nasal cannula oxygen Cardiovascular status: blood pressure returned to baseline and stable Postop Assessment: no apparent nausea or vomiting Anesthetic complications: no    Last Vitals:  Vitals:   01/05/18 1345 01/05/18 1400  BP: 98/62 (!) 97/57  Pulse: 66 62  Resp: 19 20  Temp:    SpO2: 98% 99%    Last Pain:  Vitals:   01/05/18 1400  TempSrc:   PainSc: 0-No pain                 Rebekah Zackery,W. EDMOND

## 2018-01-05 NOTE — Discharge Instructions (Signed)
[facility]      [facility] °[phone] ° °ABDOMINAL SURGERY: POST OP INSTRUCTIONS ° °Always review your discharge instruction sheet given to you by the facility where your surgery was performed. ° °IF YOU HAVE DISABILITY OR FAMILY LEAVE FORMS, YOU MUST BRING THEM TO THE OFFICE FOR PROCESSING.  PLEASE DO NOT GIVE THEM TO YOUR DOCTOR. ° °1. A prescription for pain medication may be given to you upon discharge.  Take your pain medication as prescribed, if needed.  If narcotic pain medicine is not needed, then you may take acetaminophen (Tylenol) or ibuprofen (Advil) as needed. °2. Take your usually prescribed medications unless otherwise directed. °3. If you need a refill on your pain medication, please contact your pharmacy. They will contact our office to request authorization.  Prescriptions will not be filled after 5pm or on week-ends. °4. You should follow a light diet the first few days after arrival home, such as soup and crackers, pudding, etc.unless your doctor has advised otherwise. A high-fiber, low fat diet can be resumed as tolerated.   Be sure to include lots of fluids daily. Most patients will experience some swelling and bruising on the chest and neck area.  Ice packs will help.  Swelling and bruising can take several days to resolve °5. Most patients will experience some swelling and bruising in the area of the incision. Ice pack will help. Swelling and bruising can take several days to resolve..  °6. It is common to experience some constipation if taking pain medication after surgery.  Increasing fluid intake and taking a stool softener will usually help or prevent this problem from occurring.  A mild laxative (Milk of Magnesia or Miralax) should be taken according to package directions if there are no bowel movements after 48 hours. °7.  You may have steri-strips (small skin tapes) in place directly over the incision.  These strips should be left on the skin for 10-14 days.  If your  surgeon used skin glue on the incision, you may shower in 48 hours.  The glue will flake off over the next 2-3 weeks.  Any sutures or staples will be removed at the office during your follow-up visit. You may find that a light gauze bandage over your incision may keep your staples from being rubbed or pulled. You may shower and replace the bandage daily. °8. ACTIVITIES:  You may resume regular (light) daily activities beginning the next day--such as daily self-care, walking, climbing stairs--gradually increasing activities as tolerated.  You may have sexual intercourse when it is comfortable.  Refrain from any heavy lifting or straining until approved by your doctor. °a. You may drive when you no longer are taking prescription pain medication, you can comfortably wear a seatbelt, and you can safely maneuver your car and apply brakes °b. Return to Work: __________8 weeks if applicable_________________________ °9. You should see your doctor in the office for a follow-up appointment approximately two weeks after your surgery.  Make sure that you call for this appointment within a day or two after you arrive home to insure a convenient appointment time. °OTHER INSTRUCTIONS:  °_____________________________________________________________ °_____________________________________________________________ ° °WHEN TO CALL YOUR DOCTOR: °1. Fever over 101.0 °2. Inability to urinate °3. Nausea and/or vomiting °4. Extreme swelling or bruising °5. Continued bleeding from incision. °6. Increased pain, redness, or drainage from the incision. °7. Difficulty swallowing or breathing °8. Muscle cramping or spasms. °9. Numbness or tingling in hands or feet or around lips. ° °The clinic staff is   available to answer your questions during regular business hours.  Please don’t hesitate to call and ask to speak to one of the nurses if you have concerns. ° °For further questions, please visit [email or web address] ° ° ° °

## 2018-01-05 NOTE — Op Note (Signed)
PREOPERATIVE DIAGNOSIS: cT1N0 adenocarcinoma of the pancreatic head (uncinate process)  POSTOPERATIVE DIAGNOSIS: Same.   PROCEDURES PERFORMED:  Diagnostic laparoscopy  Classic pancreaticoduodenectomy   Placement of pancreatic duct stent   SURGEON: Stark Klein, MD   ASSISTANT: Neysa Bonito, MD   ANESTHESIA: General and epidural   FINDINGS: 3 cm pancreatic head mass. Firm pancreatic tissue. 1.2 mm common bile duct. 6 mm pancreatic duct.  Significant pancreatitis was present.  The mass was stuck to the retroperitoneum, but came up with dissection.    SPECIMENS:  1.  Gallbladder with LN 2. Portal nodes  3.  Common hepatic artery lymph nodes 4. Pancreaticoduodenectomy  5.  Superior mesenteric artery nodes 6.  Ligament of Treitz node  ESTIMATED BLOOD LOSS: 350 mL.   COMPLICATIONS: None known.   PROCEDURE:   Pt was identified in the holding area and taken to  the operating room, and placed supine on the operating room  table. General anesthesia was induced. The patient's abdomen was  prepped and draped in a sterile fashion, after a Foley catheter was  placed. A time-out was performed according to the surgical safety check  list. When all was correct we continued.   The patient was placed in reverse trendelenburg position and rotated to the right.  The left subcostal margin was anesthetized with local anesthesia.  A 5 mm optiview trocar was placed under direct visualization.  The abdomen was insufflated with carbon dioxide.  The abdomen was examined.  A second port was placed in the upper midline to be able to better visualize the right liver.  No evidence of carcinomatosis was seen.    A midline incision was made from the xiphoid to just below the umbilicus. The subcutaneous tissues were divided with the Bovie cautery. The peritoneum was entered in the center of the abdomen. Digital retraction was then used to elevate the preperitoneal fat, and  this was taken with the cautery as  well.  Care was taken to protect the underlying viscera.  The Bookwalter self-retaining retractor was placed  for visualization. The right colon was taken down off of the white line  of Toldt and from the retroperitoneum at the hepatic flexure. The porta was identified. The  duodenum was kocherized extensively with blunt dissection and with cautery. This was difficult as the mass was adherent to the retroperitoneum.  The gallbladder was taken off the liver with a combination of blunt dissection and cautery. The cystic duct was clipped with the Hemalock clips. The cystic duct was divided and the gallbladder was passed off. There was a slightly prominent node of calot that was included in the specimen.    The common bile duct was skeletonized near the duodenum. A vessel loop was passed around it. The gastroduodenal artery, as well as the common hepatic artery were skeletonized. The proper hepatic artery was traced out to make sure that flow was going to both sides of the liver when the GDA was clamped. The GDA was test clamped with the bulldog, with good flow to the liver by palpation and doppler.  There were no signs of ischemia. This was divided with 2-0 silk ties and then clipped. The proper hepatic artery was reflected upward, and the anterior portal vein was exposed.  A Kelly clamp was passed underneath the pancreas at the superior mesenteric vein, and this passed  easily with no signs of tumor involvement.   Attention was then directed to the stomach, and the omentum was taken  off of the  stomach at the border of the antrum and the body. The  gastrohepatic ligament was taken down with the harmonic, and care was  taken to make sure there was not a replaced left hepatic artery in this  location. The stomach was divided with the GIA-75 stapler. The border  of the stomach was oversewn with a 3-0 running PDS suture.   Attention was then directed to the small bowel. Around 10 cm past the  ligament  of Treitz was identified and divided with the 75 mm GIA stapler.  The distal portion of the jejunum was also oversewn with a 3-0 PDS  suture. The fourth portion of the duodenum was skeletonized with the  harmonic scalpel, taking down all of the mesenteric vessels. The  ligament of Treitz was taken down. The IMV was preserved.  The duodenum was then passed underneath the portal vein. There was a prominent lymph node in this location that was dissected out and sent for permanent.    At this point the Claiborne Billings was replaced and the pancreas was divided with the cautery. 2-0 silk sutures were tied down and the inferior and superior border of the pancreas. The Bovie was used to coagulate the small bleeders at the border of the pancreas.   The Overholt in combination with the harmonic and locking Weck clips  were then used to take the uncinate process off of the portal vein and  the superior mesenteric artery. This was also difficult.  The tumor did not invade the vein, but in many places there was no good dissection plan.  Care was taken not to incorporate the  superior mesenteric artery in the dissection. The specimen was then marked and passed off the table for frozen section margin. Several nodes were noted here and removed.    The jejunum was then passed underneath  the SMV, in order to get appropriate lie for the pancreatic and biliary  anastomoses. The more distal portion of the jejunum was pulled up over  the colon, and two 3-0 silks were placed through the posterior border  of the stomach for the gastrojejunostomy. The stomach and the small  bowel were opened, and a GIA-75 was used to create an end-to-end  anastomosis. The open areas of the staple line were examined to ensure  that there was hemostasis. The defect was then closed with a single  layer of running Connell suture of 3-0 PDS. Prior to a complete  closure, the NG tube was passed toward the afferent limb.   The appropriate location  for the choledochojejunostomy was identified, and  the small bowel was opened approximately 15 mm. The anastamosis was created with approximately eleven 4-0 interrupted PDS sutures.   The 2 corner sutures were placed first  and then the posterior layer was done in an interrupted fashion tying on  the inside. The superior layer was then closed with interrupted sutures as  well.   At this point the frozens returned back as negative on the biliary margin and no invasive cancer seen on the pancreatic margin, but + atypia. The pancreatic  anastomosis was then created by opening the jejunum the length  of the pancreatic parenchyma. The pancreas was firm, and the duct was 6 mm. A pediatric feeding tube was used as a pancreatic stent. The posterior layer was formed first with 2-0 silk sutures in interrupted fashion. A duct to mucosa anastamosis was created with interrupted 5-0 PDS sutures.  The anterior layer was then oversewn with 2-0 silks  to dunk the pancreatic parenchyma.   The abdomen was then irrigated and then those anastomoses were covered  with Evicel. The omentum was tucked around the pancreatic anastamosis.  This was allowed to dry. The abdomen was then irrigated  again and all the laparotomy sponges were removed. A lap count was  performed, which was correct. Two 19-Blake drains were placed, with the  lateral-most drain placed behind the choledochojejunostomy. The medial  Blake drain was placed just anterior and slightly superior to the  pancreaticojejunostomy.  The fascia was then closed with #1 looped running PDS sutures. The skin was irrigated and then closed with  staples. The wounds were cleaned, dried and dressed with a sterile  dressing.   The patient tolerated the procedure well and was extubated and taken to  PACU in stable condition. Needle and sponge counts were correct x2.

## 2018-01-05 NOTE — Anesthesia Postprocedure Evaluation (Signed)
Anesthesia Post Note  Patient: Lauren Richard  Procedure(s) Performed: LAPAROSCOPY DIAGNOSTIC ERAS PATHWAY (N/A Abdomen) WHIPPLE PROCEDURE (N/A Abdomen)     Patient location during evaluation: PACU Anesthesia Type: Epidural and General Level of consciousness: awake and alert Pain management: pain level controlled Vital Signs Assessment: post-procedure vital signs reviewed and stable Respiratory status: spontaneous breathing, nonlabored ventilation, respiratory function stable and patient connected to nasal cannula oxygen Cardiovascular status: blood pressure returned to baseline and stable Postop Assessment: no apparent nausea or vomiting Anesthetic complications: no    Last Vitals:  Vitals:   01/05/18 1345 01/05/18 1400  BP: 98/62 (!) 97/57  Pulse: 66 62  Resp: 19 20  Temp:    SpO2: 98% 99%    Last Pain:  Vitals:   01/05/18 1400  TempSrc:   PainSc: 0-No pain                 Braydan Marriott,W. EDMOND

## 2018-01-06 ENCOUNTER — Inpatient Hospital Stay (HOSPITAL_COMMUNITY): Payer: PPO | Admitting: Anesthesiology

## 2018-01-06 ENCOUNTER — Encounter (HOSPITAL_COMMUNITY): Payer: Self-pay | Admitting: Anesthesiology

## 2018-01-06 ENCOUNTER — Encounter (HOSPITAL_COMMUNITY): Payer: Self-pay | Admitting: General Surgery

## 2018-01-06 LAB — COMPREHENSIVE METABOLIC PANEL
ALK PHOS: 56 U/L (ref 38–126)
ALT: 27 U/L (ref 14–54)
AST: 48 U/L — ABNORMAL HIGH (ref 15–41)
Albumin: 2.9 g/dL — ABNORMAL LOW (ref 3.5–5.0)
Anion gap: 7 (ref 5–15)
BILIRUBIN TOTAL: 0.7 mg/dL (ref 0.3–1.2)
BUN: 11 mg/dL (ref 6–20)
CALCIUM: 8.3 mg/dL — AB (ref 8.9–10.3)
CO2: 16 mmol/L — AB (ref 22–32)
Chloride: 113 mmol/L — ABNORMAL HIGH (ref 101–111)
Creatinine, Ser: 0.98 mg/dL (ref 0.44–1.00)
GFR calc non Af Amer: 53 mL/min — ABNORMAL LOW (ref >60)
Glucose, Bld: 149 mg/dL — ABNORMAL HIGH (ref 65–99)
Potassium: 4.3 mmol/L (ref 3.5–5.1)
SODIUM: 136 mmol/L (ref 135–145)
TOTAL PROTEIN: 5.5 g/dL — AB (ref 6.5–8.1)

## 2018-01-06 LAB — MAGNESIUM: MAGNESIUM: 1.1 mg/dL — AB (ref 1.7–2.4)

## 2018-01-06 LAB — CBC
HCT: 28.1 % — ABNORMAL LOW (ref 36.0–46.0)
HEMOGLOBIN: 9.1 g/dL — AB (ref 12.0–15.0)
MCH: 29.2 pg (ref 26.0–34.0)
MCHC: 32.4 g/dL (ref 30.0–36.0)
MCV: 90.1 fL (ref 78.0–100.0)
Platelets: 106 10*3/uL — ABNORMAL LOW (ref 150–400)
RBC: 3.12 MIL/uL — ABNORMAL LOW (ref 3.87–5.11)
RDW: 14.3 % (ref 11.5–15.5)
WBC: 11.8 10*3/uL — ABNORMAL HIGH (ref 4.0–10.5)

## 2018-01-06 LAB — PHOSPHORUS: PHOSPHORUS: 1.9 mg/dL — AB (ref 2.5–4.6)

## 2018-01-06 MED ORDER — SODIUM CHLORIDE 0.9% FLUSH: 10.0000 mL | INTRAVENOUS | Status: DC | PRN

## 2018-01-06 MED ORDER — MAGNESIUM SULFATE 4 GM/100ML IV SOLN
4.0000 g | Freq: Once | INTRAVENOUS | Status: AC
  Administered 2018-01-06: 4 g via INTRAVENOUS
  Filled 2018-01-06: qty 100

## 2018-01-06 MED ORDER — CHLORHEXIDINE GLUCONATE CLOTH 2 % EX PADS
6.0000 | MEDICATED_PAD | Freq: Every day | CUTANEOUS | Status: DC
  Administered 2018-01-06 – 2018-01-10 (×5): 6 via TOPICAL

## 2018-01-06 MED ORDER — KETOROLAC TROMETHAMINE 15 MG/ML IJ SOLN
15.0000 mg | Freq: Once | INTRAMUSCULAR | Status: AC
  Administered 2018-01-06: 15 mg via INTRAVENOUS
  Filled 2018-01-06: qty 1

## 2018-01-06 MED ORDER — SODIUM CHLORIDE 0.9 % IV BOLUS
1000.0000 mL | Freq: Once | INTRAVENOUS | Status: AC
  Administered 2018-01-06: 1000 mL via INTRAVENOUS

## 2018-01-06 MED ORDER — ALBUMIN HUMAN 25 % IV SOLN: 12.5000 g | Freq: Once | INTRAVENOUS | Status: DC

## 2018-01-06 MED ORDER — SODIUM CHLORIDE 0.9% FLUSH
10.0000 mL | Freq: Two times a day (BID) | INTRAVENOUS | Status: DC
  Administered 2018-01-06 – 2018-01-07 (×3): 10 mL
  Administered 2018-01-08: 20 mL
  Administered 2018-01-09 – 2018-01-10 (×3): 10 mL

## 2018-01-06 MED ORDER — ALBUMIN HUMAN 25 % IV SOLN
25.0000 g | Freq: Four times a day (QID) | INTRAVENOUS | Status: AC
  Administered 2018-01-06 – 2018-01-07 (×5): 25 g via INTRAVENOUS
  Filled 2018-01-06 (×2): qty 50
  Filled 2018-01-06: qty 100
  Filled 2018-01-06: qty 50
  Filled 2018-01-06: qty 100
  Filled 2018-01-06: qty 50

## 2018-01-06 MED ORDER — POTASSIUM PHOSPHATES 15 MMOLE/5ML IV SOLN
30.0000 mmol | Freq: Once | INTRAVENOUS | Status: AC
  Administered 2018-01-06: 30 mmol via INTRAVENOUS
  Filled 2018-01-06: qty 10

## 2018-01-06 MED ORDER — FENTANYL 40 MCG/ML IV SOLN
INTRAVENOUS | Status: DC
  Administered 2018-01-06: 1000 ug via INTRAVENOUS
  Administered 2018-01-07: 110 ug via INTRAVENOUS
  Administered 2018-01-07: 80 ug via INTRAVENOUS
  Administered 2018-01-08: 10 ug via INTRAVENOUS
  Administered 2018-01-08: 60 ug via INTRAVENOUS
  Administered 2018-01-08: 10 ug via INTRAVENOUS
  Administered 2018-01-08: 30 ug via INTRAVENOUS
  Administered 2018-01-08: 60 ug via INTRAVENOUS
  Administered 2018-01-08 – 2018-01-09 (×3): 0 ug via INTRAVENOUS
  Administered 2018-01-09: 1000 ug via INTRAVENOUS
  Administered 2018-01-10: 23:00:00 via INTRAVENOUS
  Filled 2018-01-06: qty 25

## 2018-01-06 NOTE — Progress Notes (Signed)
1 Day Post-Op   Subjective/Chief Complaint: Denies pain.  No n/v.  BP a little soft this AM.  Art line dampened and inaccurate, so pulled.     Objective: Vital signs in last 24 hours: Temp:  [97.7 F (36.5 C)-100 F (37.8 C)] 98.7 F (37.1 C) (03/29 1200) Pulse Rate:  [58-77] 68 (03/29 1230) Resp:  [14-24] 20 (03/29 1230) BP: (80-209)/(46-187) 92/80 (03/29 1230) SpO2:  [94 %-100 %] 96 % (03/29 1230) Arterial Line BP: (66-115)/(50-98) 66/56 (03/29 0900) FiO2 (%):  [0 %] 0 % (03/28 1600) Weight:  [86.2 kg (190 lb)] 86.2 kg (190 lb) (03/28 1500) Last BM Date: 01/04/18  Intake/Output from previous day: 03/28 0701 - 03/29 0700 In: 3815 [I.V.:2655; IV Piggyback:1000] Out: 1275 [Urine:710; Emesis/NG output:50; Drains:165; Blood:350] Intake/Output this shift: Total I/O In: 110 [I.V.:100; Other:10] Out: -   General appearance: alert, cooperative and no distress Resp: breathing comfortably Cardio: regular rate and rhythm GI: soft, mildly distended, non tender.  some serosang staining on dressing.  drains serosang Extremities: extremities normal, atraumatic, no cyanosis or edema  Lab Results:  Recent Labs    01/05/18 0924 01/06/18 0352  WBC  --  11.8*  HGB 10.5* 9.1*  HCT 31.0* 28.1*  PLT  --  106*   BMET Recent Labs    01/05/18 0924 01/06/18 0352  NA 141 136  K 3.9 4.3  CL  --  113*  CO2  --  16*  GLUCOSE  --  149*  BUN  --  11  CREATININE  --  0.98  CALCIUM  --  8.3*   PT/INR No results for input(s): LABPROT, INR in the last 72 hours. ABG Recent Labs    01/05/18 0924  PHART 7.342*  HCO3 20.1    Studies/Results: No results found.  Anti-infectives: Anti-infectives (From admission, onward)   Start     Dose/Rate Route Frequency Ordered Stop   01/05/18 2000  ceFAZolin (ANCEF) IVPB 2g/100 mL premix     2 g 200 mL/hr over 30 Minutes Intravenous Every 8 hours 01/05/18 1454 01/05/18 2058   01/05/18 0555  ceFAZolin (ANCEF) 2-4 GM/100ML-% IVPB    Note to  Pharmacy:  Nyoka Cowden   : cabinet override      01/05/18 0555 01/05/18 0806   01/05/18 0548  ceFAZolin (ANCEF) IVPB 2g/100 mL premix     2 g 200 mL/hr over 30 Minutes Intravenous On call to O.R. 01/05/18 0548 01/05/18 1206      Assessment/Plan: s/p Procedure(s) with comments: LAPAROSCOPY DIAGNOSTIC ERAS PATHWAY (N/A) - EDIDURAL WHIPPLE PROCEDURE (N/A) Pancreatic adenocarcinoma, await pathology  HTN- IV metoprolol when she can tolerate Hypotension/hypoalbuminemia- IV 25% albumin x 48 hours.  1 L saline bolus.   Hypomagnesemia and hypophosphatemia - replete.   Acute blood loss anemia Hyperglycemia - stress induced.  Will watch to see if this normalized.  May need treatment given portion of pancreas removed and presence of chronic pancreatitis.   Moderate protein calorie malutrition. Lovenox and SCDs for VTE ppx protonix for GI ppx. Epidural and prn fentanyl for pain control.   Anticipate 48-72 hours with NPO/NGT.    LOS: 1 day    Stark Klein 01/06/2018

## 2018-01-06 NOTE — Anesthesia Post-op Follow-up Note (Deleted)
  Anesthesia Pain Follow-up Note  Patient: TRANIYA PRICHETT  Day #: 1  Date of Follow-up: 01/06/2018 Time: 1:51 PM  Last Vitals:  Vitals:   01/06/18 1230 01/06/18 1300  BP: 92/80 (!) 70/47  Pulse: 68 71  Resp: 20 17  Temp:    SpO2: 96% 96%    Level of Consciousness: alert  Pain: mild   Side Effects:None  Catheter Site Exam:clean, dry, no drainage  Epidural / Intrathecal (From admission, onward)   Start     Dose/Rate Route Frequency Ordered Stop   01/05/18 1600  ropivacaine (PF) 2 mg/mL (0.2%) (NAROPIN) injection     10 mL/hr 10 mL/hr  Epidural Continuous 01/05/18 1453         Plan: Continue current therapy of postop epidural at surgeon's request  Epidural infusion rate decreased to 9cc/hr per surgeon order due to hypotension. Please call with any further questions.  Catalina Gravel

## 2018-01-06 NOTE — Anesthesia Post-op Follow-up Note (Signed)
  Anesthesia Pain Follow-up Note  Patient: Lauren Richard  Day #: 1  Date of Follow-up: 01/06/2018 Time: 5:29 PM  Last Vitals:  Vitals:   01/06/18 1613 01/06/18 1700  BP:  (!) 145/66  Pulse:  84  Resp: 20 (!) 24  Temp:    SpO2: 97% 96%    Level of Consciousness: alert  Pain: mild   Side Effects:None  Catheter Site Exam:clean, dry, no drainage  Epidural / Intrathecal (From admission, onward)   Start     Dose/Rate Route Frequency Ordered Stop   01/05/18 1600  ropivacaine (PF) 2 mg/mL (0.2%) (NAROPIN) injection     9 mL/hr 9 mL/hr  Epidural Continuous 01/05/18 1453         Plan: Continue current therapy of postop epidural at surgeon's request  Patient with episodes of hypotension, epidural infusion rate decreased per surgeon request.  Will continue to follow.  Catalina Gravel

## 2018-01-07 ENCOUNTER — Other Ambulatory Visit: Payer: Self-pay

## 2018-01-07 LAB — COMPREHENSIVE METABOLIC PANEL
ALK PHOS: 61 U/L (ref 38–126)
ALT: 20 U/L (ref 14–54)
AST: 29 U/L (ref 15–41)
Albumin: 3.4 g/dL — ABNORMAL LOW (ref 3.5–5.0)
Anion gap: 6 (ref 5–15)
BUN: 5 mg/dL — ABNORMAL LOW (ref 6–20)
CALCIUM: 8.8 mg/dL — AB (ref 8.9–10.3)
CO2: 21 mmol/L — AB (ref 22–32)
Chloride: 111 mmol/L (ref 101–111)
Creatinine, Ser: 0.74 mg/dL (ref 0.44–1.00)
GFR calc Af Amer: >60 mL/min (ref >60)
GFR calc non Af Amer: >60 mL/min (ref >60)
GLUCOSE: 125 mg/dL — AB (ref 65–99)
Potassium: 3.4 mmol/L — ABNORMAL LOW (ref 3.5–5.1)
SODIUM: 138 mmol/L (ref 135–145)
Total Bilirubin: 0.9 mg/dL (ref 0.3–1.2)
Total Protein: 6.2 g/dL — ABNORMAL LOW (ref 6.5–8.1)

## 2018-01-07 LAB — CBC
HCT: 27.4 % — ABNORMAL LOW (ref 36.0–46.0)
HEMOGLOBIN: 8.7 g/dL — AB (ref 12.0–15.0)
MCH: 28.8 pg (ref 26.0–34.0)
MCHC: 31.8 g/dL (ref 30.0–36.0)
MCV: 90.7 fL (ref 78.0–100.0)
Platelets: 212 10*3/uL (ref 150–400)
RBC: 3.02 MIL/uL — ABNORMAL LOW (ref 3.87–5.11)
RDW: 14.3 % (ref 11.5–15.5)
WBC: 13.1 10*3/uL — ABNORMAL HIGH (ref 4.0–10.5)

## 2018-01-07 LAB — PHOSPHORUS: Phosphorus: 1.6 mg/dL — ABNORMAL LOW (ref 2.5–4.6)

## 2018-01-07 LAB — MAGNESIUM: Magnesium: 1.6 mg/dL — ABNORMAL LOW (ref 1.7–2.4)

## 2018-01-07 MED ORDER — HYDRALAZINE HCL 20 MG/ML IJ SOLN: 20.0000 mg | INTRAMUSCULAR | Status: DC | PRN

## 2018-01-07 MED ORDER — METOPROLOL TARTRATE 5 MG/5ML IV SOLN
5.0000 mg | Freq: Three times a day (TID) | INTRAVENOUS | Status: DC
  Administered 2018-01-07 – 2018-01-09 (×7): 5 mg via INTRAVENOUS
  Filled 2018-01-07 (×7): qty 5

## 2018-01-07 MED ORDER — POTASSIUM PHOSPHATES 15 MMOLE/5ML IV SOLN
30.0000 mmol | Freq: Once | INTRAVENOUS | Status: AC
  Administered 2018-01-07: 30 mmol via INTRAVENOUS
  Filled 2018-01-07: qty 10

## 2018-01-07 MED ORDER — MAGNESIUM SULFATE 4 GM/100ML IV SOLN
4.0000 g | Freq: Once | INTRAVENOUS | Status: AC
  Administered 2018-01-07: 4 g via INTRAVENOUS
  Filled 2018-01-07: qty 100

## 2018-01-07 NOTE — Addendum Note (Signed)
Addendum  created 01/07/18 1527 by Effie Berkshire, MD   Sign clinical note

## 2018-01-07 NOTE — Anesthesia Post-op Follow-up Note (Signed)
  Anesthesia Pain Follow-up Note  Patient: Lauren Richard  Day #: 2  Date of Follow-up: 01/07/2018 Time: 3:26 PM  Last Vitals:  Vitals:   01/07/18 1300 01/07/18 1400  BP: (!) 142/112 (!) 150/97  Pulse: 96 92  Resp: (!) 21 (!) 23  Temp:    SpO2: 96% 96%    Level of Consciousness: alert  Pain: 2 /10, mild   Side Effects:None  Catheter Site Exam:clean, dry, no drainage  Epidural / Intrathecal (From admission, onward)   Start     Dose/Rate Route Frequency Ordered Stop   01/05/18 1600  ropivacaine (PF) 2 mg/mL (0.2%) (NAROPIN) injection     10 mL/hr 10 mL/hr  Epidural Continuous 01/05/18 1453         Plan: Continue current therapy of postop epidural at surgeon's request  Effie Berkshire

## 2018-01-07 NOTE — Progress Notes (Signed)
2 Days Post-Op   Subjective/Chief Complaint: BP rebounded well yesterday.  Started having more pain, so did dose of 15 mg toradol and low dose fentanyl PCA.   No n/v.     Objective: Vital signs in last 24 hours: Temp:  [98 F (36.7 C)-100 F (37.8 C)] 99.2 F (37.3 C) (03/30 0400) Pulse Rate:  [65-124] 86 (03/30 0700) Resp:  [14-31] 18 (03/30 0700) BP: (70-190)/(47-176) 153/68 (03/30 0700) SpO2:  [94 %-98 %] 96 % (03/30 0700) Arterial Line BP: (66-76)/(50-56) 66/56 (03/29 0900) Last BM Date: 01/04/18  Intake/Output from previous day: 03/29 0701 - 03/30 0700 In: 4560.4 [I.V.:3400; IV Piggyback:865] Out: 4360 [Urine:3700; Emesis/NG output:400; Drains:260] Intake/Output this shift: No intake/output data recorded.  General appearance: alert, cooperative and no distress Resp: breathing comfortably Cardio: regular rate and rhythm, occ PVCs GI: soft, mildly distended, non tender.  some serosang staining on dressing.  drains serosang Extremities: extremities normal, atraumatic, no cyanosis or edema  Lab Results:  Recent Labs    01/06/18 0352 01/07/18 0541  WBC 11.8* 13.1*  HGB 9.1* 8.7*  HCT 28.1* 27.4*  PLT 106* 212   BMET Recent Labs    01/06/18 0352 01/07/18 0541  NA 136 138  K 4.3 3.4*  CL 113* 111  CO2 16* 21*  GLUCOSE 149* 125*  BUN 11 5*  CREATININE 0.98 0.74  CALCIUM 8.3* 8.8*   PT/INR No results for input(s): LABPROT, INR in the last 72 hours. ABG Recent Labs    01/05/18 0924  PHART 7.342*  HCO3 20.1    Studies/Results: No results found.  Anti-infectives: Anti-infectives (From admission, onward)   Start     Dose/Rate Route Frequency Ordered Stop   01/05/18 2000  ceFAZolin (ANCEF) IVPB 2g/100 mL premix     2 g 200 mL/hr over 30 Minutes Intravenous Every 8 hours 01/05/18 1454 01/05/18 2058   01/05/18 0555  ceFAZolin (ANCEF) 2-4 GM/100ML-% IVPB    Note to Pharmacy:  Nyoka Cowden   : cabinet override      01/05/18 0555 01/05/18 0806   01/05/18 0548  ceFAZolin (ANCEF) IVPB 2g/100 mL premix     2 g 200 mL/hr over 30 Minutes Intravenous On call to O.R. 01/05/18 0548 01/05/18 1206      Assessment/Plan: s/p Procedure(s) with comments: LAPAROSCOPY DIAGNOSTIC ERAS PATHWAY (N/A) - EDIDURAL WHIPPLE PROCEDURE (N/A) Pancreatic adenocarcinoma, await pathology  HTN- IV metoprolol, PRN hydralazine Hypotension resolved.   Hypomagnesemia, hypokalemia and hypophosphatemia - improved, but still low.  Replete.   Acute blood loss anemia Hyperglycemia - stress induced.  Improved today.  May need treatment given portion of pancreas removed and presence of chronic pancreatitis.   Moderate protein calorie malutrition. Lovenox and SCDs for VTE ppx Protonix for GI ppx. Epidural and low dose fentanyl PCA. Anticipate d/c NGT tomorrow.     LOS: 2 days    Stark Klein 01/07/2018

## 2018-01-08 LAB — COMPREHENSIVE METABOLIC PANEL
ALT: 14 U/L (ref 14–54)
AST: 20 U/L (ref 15–41)
Albumin: 3.4 g/dL — ABNORMAL LOW (ref 3.5–5.0)
Alkaline Phosphatase: 53 U/L (ref 38–126)
Anion gap: 6 (ref 5–15)
BILIRUBIN TOTAL: 1 mg/dL (ref 0.3–1.2)
CO2: 21 mmol/L — ABNORMAL LOW (ref 22–32)
Calcium: 9 mg/dL (ref 8.9–10.3)
Chloride: 109 mmol/L (ref 101–111)
Creatinine, Ser: 0.69 mg/dL (ref 0.44–1.00)
Glucose, Bld: 134 mg/dL — ABNORMAL HIGH (ref 65–99)
POTASSIUM: 3.9 mmol/L (ref 3.5–5.1)
Sodium: 136 mmol/L (ref 135–145)
TOTAL PROTEIN: 5.9 g/dL — AB (ref 6.5–8.1)

## 2018-01-08 LAB — CBC
HEMATOCRIT: 28.6 % — AB (ref 36.0–46.0)
Hemoglobin: 9.2 g/dL — ABNORMAL LOW (ref 12.0–15.0)
MCH: 29.4 pg (ref 26.0–34.0)
MCHC: 32.2 g/dL (ref 30.0–36.0)
MCV: 91.4 fL (ref 78.0–100.0)
PLATELETS: 205 10*3/uL (ref 150–400)
RBC: 3.13 MIL/uL — ABNORMAL LOW (ref 3.87–5.11)
RDW: 14 % (ref 11.5–15.5)
WBC: 9.7 10*3/uL (ref 4.0–10.5)

## 2018-01-08 LAB — MAGNESIUM: MAGNESIUM: 1.7 mg/dL (ref 1.7–2.4)

## 2018-01-08 LAB — PHOSPHORUS: PHOSPHORUS: 2.2 mg/dL — AB (ref 2.5–4.6)

## 2018-01-08 MED ORDER — ACETAMINOPHEN 325 MG PO TABS
650.0000 mg | ORAL_TABLET | Freq: Four times a day (QID) | ORAL | Status: DC | PRN
  Administered 2018-01-08 (×2): 650 mg via ORAL
  Filled 2018-01-08 (×2): qty 2

## 2018-01-08 NOTE — Progress Notes (Signed)
3 Days Post-Op   Subjective/Chief Complaint: No acute events other than low grade temp.     Objective: Vital signs in last 24 hours: Temp:  [98.4 F (36.9 C)-101.3 F (38.5 C)] 101.3 F (38.5 C) (03/31 0706) Pulse Rate:  [72-101] 76 (03/31 0700) Resp:  [14-29] 19 (03/31 0700) BP: (112-160)/(55-130) 123/65 (03/31 0700) SpO2:  [94 %-97 %] 95 % (03/31 0700) Last BM Date: 01/04/18  Intake/Output from previous day: 03/30 0701 - 03/31 0700 In: 3410 [I.V.:2420; IV Piggyback:760] Out: 4630 [Urine:3700; Emesis/NG output:800; Drains:130] Intake/Output this shift: No intake/output data recorded.  General appearance: alert, cooperative and no distress.  NGT bilious. Resp: breathing comfortably Cardio: regular rate and rhythm, occ PVCs GI: soft, mildly distended, non tender.  some serosang staining on dressing.  drains serosang Extremities: extremities normal, atraumatic, no cyanosis or edema  Lab Results:  Recent Labs    01/07/18 0541 01/08/18 0244  WBC 13.1* 9.7  HGB 8.7* 9.2*  HCT 27.4* 28.6*  PLT 212 205   BMET Recent Labs    01/07/18 0541 01/08/18 0244  NA 138 136  K 3.4* 3.9  CL 111 109  CO2 21* 21*  GLUCOSE 125* 134*  BUN 5* <5*  CREATININE 0.74 0.69  CALCIUM 8.8* 9.0   PT/INR No results for input(s): LABPROT, INR in the last 72 hours. ABG No results for input(s): PHART, HCO3 in the last 72 hours.  Invalid input(s): PCO2, PO2  Studies/Results: No results found.  Anti-infectives: Anti-infectives (From admission, onward)   Start     Dose/Rate Route Frequency Ordered Stop   01/05/18 2000  ceFAZolin (ANCEF) IVPB 2g/100 mL premix     2 g 200 mL/hr over 30 Minutes Intravenous Every 8 hours 01/05/18 1454 01/05/18 2058   01/05/18 0555  ceFAZolin (ANCEF) 2-4 GM/100ML-% IVPB    Note to Pharmacy:  Nyoka Cowden   : cabinet override      01/05/18 0555 01/05/18 0806   01/05/18 0548  ceFAZolin (ANCEF) IVPB 2g/100 mL premix     2 g 200 mL/hr over 30 Minutes  Intravenous On call to O.R. 01/05/18 0548 01/05/18 1206      Assessment/Plan: s/p Procedure(s) with comments: LAPAROSCOPY DIAGNOSTIC ERAS PATHWAY (N/A) - EDIDURAL WHIPPLE PROCEDURE (N/A) Pancreatic adenocarcinoma, await pathology  HTN- IV metoprolol, PRN hydralazine Hypotension resolved.   Hypomagnesemia, hypokalemia and hypophosphatemia - improved.  Recheck tomorrow.     Acute blood loss anemia- stable.   Hyperglycemia - stress induced.  Stable.  May need treatment given portion of pancreas removed and presence of chronic pancreatitis.   Moderate protein calorie malutrition. Lovenox and SCDs for VTE ppx Protonix for GI ppx. Epidural and low dose fentanyl PCA. D/c NGT and start clears. Hope to advance to fulls tomorrow and start oral meds tomorrow.   Would plan d/c epidural tomorrow or Tuesday.  Seems to be working well.   LOS: 3 days    Stark Klein 01/08/2018

## 2018-01-08 NOTE — Addendum Note (Signed)
Addendum  created 01/08/18 1258 by Roberts Gaudy, MD   Sign clinical note

## 2018-01-08 NOTE — Plan of Care (Signed)
Patient maintainins, sts, HR, and bp within parameters.

## 2018-01-08 NOTE — Progress Notes (Signed)
Anesthesiology Follow-up:  Awake and alert with epidural ropivacaine 0.2% at 10 cc/hr and fentanyl PCA. Able to move LEs and ambulate to chair with assistance. NG and foley removed today.   VS: T- 37.8 BP- 146?81 HR- 99 RR- 22 O2 Sat 97% on RA  Will continue epidural ropivacaine today. Plan to d/c epidural on Tuesday.   Roberts Gaudy

## 2018-01-09 ENCOUNTER — Inpatient Hospital Stay (HOSPITAL_COMMUNITY): Payer: PPO

## 2018-01-09 LAB — PHOSPHORUS: Phosphorus: 2.8 mg/dL (ref 2.5–4.6)

## 2018-01-09 LAB — COMPREHENSIVE METABOLIC PANEL
ALBUMIN: 3.1 g/dL — AB (ref 3.5–5.0)
ALK PHOS: 54 U/L (ref 38–126)
ALT: 12 U/L — AB (ref 14–54)
AST: 15 U/L (ref 15–41)
Anion gap: 4 — ABNORMAL LOW (ref 5–15)
BILIRUBIN TOTAL: 0.6 mg/dL (ref 0.3–1.2)
BUN: 6 mg/dL (ref 6–20)
CO2: 21 mmol/L — ABNORMAL LOW (ref 22–32)
Calcium: 8.9 mg/dL (ref 8.9–10.3)
Chloride: 111 mmol/L (ref 101–111)
Creatinine, Ser: 0.85 mg/dL (ref 0.44–1.00)
GFR calc Af Amer: >60 mL/min (ref >60)
GFR calc non Af Amer: >60 mL/min (ref >60)
Glucose, Bld: 118 mg/dL — ABNORMAL HIGH (ref 65–99)
POTASSIUM: 4 mmol/L (ref 3.5–5.1)
Sodium: 136 mmol/L (ref 135–145)
TOTAL PROTEIN: 6 g/dL — AB (ref 6.5–8.1)

## 2018-01-09 LAB — CBC
HEMATOCRIT: 25.8 % — AB (ref 36.0–46.0)
HEMOGLOBIN: 8.6 g/dL — AB (ref 12.0–15.0)
MCH: 30.6 pg (ref 26.0–34.0)
MCHC: 33.3 g/dL (ref 30.0–36.0)
MCV: 91.8 fL (ref 78.0–100.0)
Platelets: 164 10*3/uL (ref 150–400)
RBC: 2.81 MIL/uL — AB (ref 3.87–5.11)
RDW: 14.1 % (ref 11.5–15.5)
WBC: 8.7 10*3/uL (ref 4.0–10.5)

## 2018-01-09 LAB — MAGNESIUM: Magnesium: 1.5 mg/dL — ABNORMAL LOW (ref 1.7–2.4)

## 2018-01-09 MED ORDER — MAGNESIUM SULFATE 2 GM/50ML IV SOLN
2.0000 g | Freq: Once | INTRAVENOUS | Status: AC
  Administered 2018-01-09: 2 g via INTRAVENOUS
  Filled 2018-01-09: qty 50

## 2018-01-09 MED ORDER — ATENOLOL 25 MG PO TABS
25.0000 mg | ORAL_TABLET | Freq: Every day | ORAL | Status: DC
  Administered 2018-01-09 – 2018-01-14 (×6): 25 mg via ORAL
  Filled 2018-01-09 (×6): qty 1

## 2018-01-09 NOTE — Addendum Note (Signed)
Addendum  created 01/09/18 1108 by Josephine Igo, MD   [facility] properties accepted, Sign clinical note

## 2018-01-09 NOTE — Evaluation (Signed)
Occupational Therapy Evaluation Patient Details Name: Lauren Richard MRN: [id number] DOB: 1936/02/04 Today's Date: 01/09/2018    History of Present Illness pt is an 82 y/o female who presents for follow up for pancreatic CA.  Recent hx included 10 days of N/V/anorexia where a mass was found at the head of the pancreas.  Also underwent ERCP with stenting.  AKI was reversed.  Pt now back to her "normal"  On 3/28 pt underwent laproscopic resection of the tumor, stenting of the pancreatic duct, Whipple procedure  PMH includes, HTN, arthritis,   Clinical Impression   Pt admitted with above. She demonstrates the below listed deficits and will benefit from continued OT to maximize safety and independence with BADLs.  Pt presents to OT with generalized weakness, decreased activity tolerance, pain.  She requires max A for LB ADLs and min A - mod A for UB.  Min A +2 for functional mobility.  She lives alone in second floor apt. And was fully independent with ADLs and IADLs except driving.  Feel she will benefit from SNF level rehab at discharge to allow her to safely return home with intermittent assist, while reducing risk of falls/injury and readmission.  Will follow acutely.       Follow Up Recommendations  SNF;Supervision/Assistance - 24 hour    Equipment Recommendations  3 in 1 bedside commode    Recommendations for Other Services       Precautions / Restrictions Precautions Precautions: Fall      Mobility Bed Mobility Overal bed mobility: Needs Assistance Bed Mobility: Rolling;Sidelying to Sit Rolling: Mod assist Sidelying to sit: Mod assist;+2 for safety/equipment;+2 for physical assistance       General bed mobility comments: Pt instructed to log roll to reduce stress on incision and decreased pain   Transfers Overall transfer level: Needs assistance Equipment used: None;Rolling walker (2 wheeled) Transfers: Sit to/from [employer] Sit to Stand: Min assist;+2  safety/equipment;+2 physical assistance Stand pivot transfers: Min assist;+2 safety/equipment       General transfer comment: Pt requires cues for hand placement and assist to power up into standing     Balance Overall balance assessment: Needs assistance Sitting-balance support: Feet supported Sitting balance-Leahy Scale: Fair     Standing balance support: Bilateral upper extremity supported Standing balance-Leahy Scale: Poor                             ADL either performed or assessed with clinical judgement   ADL Overall ADL's : Needs assistance/impaired Eating/Feeding: Independent   Grooming: Wash/dry hands;Wash/dry face;Oral care;Set up;Sitting   Upper Body Bathing: Moderate assistance;Sitting   Lower Body Bathing: Maximal assistance;Sit to/from stand   Upper Body Dressing : Moderate assistance;Sitting   Lower Body Dressing: Total assistance;Sit to/from stand   Toilet Transfer: Minimal assistance;+2 for safety/equipment;Ambulation;Comfort height toilet;RW;Grab bars;BSC   Toileting- Clothing Manipulation and Hygiene: Maximal assistance;Sit to/from stand       Functional mobility during ADLs: Minimal assistance;+2 for safety/equipment;+2 for physical assistance;Rolling walker       Vision         Perception     Praxis      Pertinent Vitals/Pain Pain Assessment: Faces Faces Pain Scale: Hurts little more Pain Location: abdomen  Pain Descriptors / Indicators: Pressure Pain Intervention(s): Monitored during session;PCA encouraged;Repositioned     Hand Dominance Right   Extremity/Trunk Assessment Upper Extremity Assessment Upper Extremity Assessment: Generalized weakness   Lower Extremity Assessment  Lower Extremity Assessment: Defer to PT evaluation   Cervical / Trunk Assessment Cervical / Trunk Assessment: Other exceptions Cervical / Trunk Exceptions: flexed posture due to abdominal pain    Communication Communication Communication:  No difficulties   Cognition Arousal/Alertness: Awake/alert Behavior During Therapy: WFL for tasks assessed/performed Overall Cognitive Status: Within Functional Limits for tasks assessed                                     General Comments  VSS on RA     Exercises     Shoulder Instructions      Home Living Family/patient expects to be discharged to:: Private residence Living Arrangements: Alone Available Help at Discharge: Family;Available PRN/intermittently Type of Home: Apartment Home Access: Stairs to enter Entrance Stairs-Number of Steps: full flight  Entrance Stairs-Rails: Right;Left Home Layout: One level     Bathroom Shower/Tub: Corporate investment banker: Standard     Home Equipment: Wheelchair - [employer] - 2 wheels;Other (comment);Shower seat          Prior Functioning/Environment Level of Independence: Independent        Comments: Pt does not drive, but ambulates through grocery store.   Is independent with IADLs, does not use AE         OT Problem List: Decreased strength;Decreased activity tolerance;Impaired balance (sitting and/or standing);Decreased safety awareness;Pain;Decreased knowledge of use of DME or AE      OT Treatment/Interventions: Self-care/ADL training;DME and/or AE instruction;Therapeutic activities;Patient/family education;Therapeutic exercise    OT Goals(Current goals can be found in the care plan section) Acute Rehab OT Goals Patient Stated Goal: to get stronger  OT Goal Formulation: With patient Time For Goal Achievement: 01/23/18 Potential to Achieve Goals: Good ADL Goals Pt Will Perform Grooming: with supervision;standing Pt Will Perform Lower Body Bathing: with supervision;with adaptive equipment;sit to/from stand Pt Will Perform Lower Body Dressing: with supervision;sit to/from stand Pt Will Transfer to Toilet: with supervision;ambulating;regular height toilet;bedside commode;grab  bars Pt Will Perform Toileting - Clothing Manipulation and hygiene: with supervision;sit to/from stand  OT Frequency: Min 2X/week   Barriers to D/C: Decreased caregiver support          Co-evaluation PT/OT/SLP Co-Evaluation/Treatment: Yes Reason for Co-Treatment: For patient/therapist safety;To address functional/ADL transfers   OT goals addressed during session: ADL's and self-care      AM-PAC PT "6 Clicks" Daily Activity     Outcome Measure Help from another person eating meals?: None Help from another person taking care of personal grooming?: A Little Help from another person toileting, which includes using toliet, bedpan, or urinal?: A Lot Help from another person bathing (including washing, rinsing, drying)?: A Lot Help from another person to put on and taking off regular upper body clothing?: A Lot Help from another person to put on and taking off regular lower body clothing?: Total 6 Click Score: 14   End of Session Equipment Utilized During Treatment: Rolling walker Nurse Communication: Mobility status  Activity Tolerance: Patient tolerated treatment well Patient left: in chair;with call bell/phone within reach;with family/visitor present  OT Visit Diagnosis: Unsteadiness on feet (R26.81);Pain Pain - part of body: (abdomen  )                Time: 1610-9604 OT Time Calculation (min): 33 min Charges:  OT General Charges $OT Visit: 1 Visit OT Evaluation $OT Eval Moderate Complexity: 1 Mod G-Codes:     [employer]  [place], OTR/L [phone]   Lucille Passy M 01/09/2018, 6:04 PM

## 2018-01-09 NOTE — Anesthesia Post-op Follow-up Note (Signed)
  Anesthesia Pain Follow-up Note  Patient: Lauren Richard  Day #: 4  Date of Follow-up: 01/09/2018 Time: 11:06 AM  Last Vitals:  Vitals:   01/09/18 0700 01/09/18 0800  BP:    Pulse:    Resp:  (!) 22  Temp: 36.8 C   SpO2:  100%    Level of Consciousness: alert  Pain: mild   Side Effects:None  Catheter Site Exam:clean, dry, no drainage  Epidural / Intrathecal (From admission, onward)   Start     Dose/Rate Route Frequency Ordered Stop   01/05/18 1600  ropivacaine (PF) 2 mg/mL (0.2%) (NAROPIN) injection     10 mL/hr 10 mL/hr  Epidural Continuous 01/05/18 1453         Plan: Catheter removed/tip intact at surgeon's request, D/C Infusion at surgeon's request and D/C from anesthesia care at surgeon's request  Shahrzad Koble A.

## 2018-01-09 NOTE — Care Management Important Message (Signed)
Important Message  Patient Details  Name: Lauren Richard MRN: [id number] Date of Birth: [birth year]   Medicare Important Message Given:  Yes    Orbie Pyo 01/09/2018, 2:59 PM

## 2018-01-09 NOTE — Progress Notes (Signed)
4 Days Post-Op   Subjective/Chief Complaint: Had to have foley reinserted for retention. Also had a temp overnight.  Tolerated clears.     Objective: Vital signs in last 24 hours: Temp:  [98.2 F (36.8 C)-102.9 F (39.4 C)] 98.2 F (36.8 C) (04/01 0700) Pulse Rate:  [64-101] 75 (04/01 0547) Resp:  [17-28] 20 (04/01 0547) BP: (95-163)/(58-106) 163/79 (04/01 0547) SpO2:  [92 %-100 %] 100 % (04/01 0547) Weight:  [91.2 kg (201 lb 1 oz)] 91.2 kg (201 lb 1 oz) (04/01 0547) Last BM Date: 01/04/18  Intake/Output from previous day: 03/31 0701 - 04/01 0700 In: 2560 [P.O.:240; I.V.:2200] Out: 8502 [Urine:1600; Drains:185] Intake/Output this shift: No intake/output data recorded.  General appearance: alert, cooperative and no distress.   Resp: breathing comfortably Cardio: regular rate and rhythm, occ PVCs GI: soft, mildly distended, non tender.  some serosang staining on dressing.  drains serosang Extremities: extremities normal, atraumatic, no cyanosis or edema  Lab Results:  Recent Labs    01/08/18 0244 01/09/18 0600  WBC 9.7 8.7  HGB 9.2* 8.6*  HCT 28.6* 25.8*  PLT 205 164   BMET Recent Labs    01/08/18 0244 01/09/18 0600  NA 136 136  K 3.9 4.0  CL 109 111  CO2 21* 21*  GLUCOSE 134* 118*  BUN <5* 6  CREATININE 0.69 0.85  CALCIUM 9.0 8.9   PT/INR No results for input(s): LABPROT, INR in the last 72 hours. ABG No results for input(s): PHART, HCO3 in the last 72 hours.  Invalid input(s): PCO2, PO2  Studies/Results: No results found.  Anti-infectives: Anti-infectives (From admission, onward)   Start     Dose/Rate Route Frequency Ordered Stop   01/05/18 2000  ceFAZolin (ANCEF) IVPB 2g/100 mL premix     2 g 200 mL/hr over 30 Minutes Intravenous Every 8 hours 01/05/18 1454 01/05/18 2058   01/05/18 0555  ceFAZolin (ANCEF) 2-4 GM/100ML-% IVPB    Note to Pharmacy:  Nyoka Cowden   : cabinet override      01/05/18 0555 01/05/18 0806   01/05/18 0548   ceFAZolin (ANCEF) IVPB 2g/100 mL premix     2 g 200 mL/hr over 30 Minutes Intravenous On call to O.R. 01/05/18 0548 01/05/18 1206      Assessment/Plan: s/p Procedure(s) with comments: LAPAROSCOPY DIAGNOSTIC ERAS PATHWAY (N/A) - EDIDURAL WHIPPLE PROCEDURE (N/A) Pancreatic adenocarcinoma, await pathology  HTN- IV metoprolol, PRN hydralazine Hypotension resolved.   Hypomagnesemia, hypokalemia and hypophosphatemia - improved. Still with hypomagnesium, replete again.  .     Acute blood loss anemia- stable.   Hyperglycemia - stress induced.  Stable.  May need treatment given portion of pancreas removed and presence of chronic pancreatitis.   Moderate protein calorie malutrition. Advance to full liquids.   Lovenox and SCDs for VTE ppx Protonix for GI ppx. OK to convert to oral.   Epidural and low dose fentanyl PCA.  Plan d/c epidural tomorrow.   Fever - ua and CXR.  No GI symptoms and drains serosang.  If fevers continue and ua/CXR negative, will plan CT tomorrow.  Urinary retention- foley replaced.  Will not pull again until epidural is out. D/c iv metoprolol and go back to tenormin.  Continuing to have intermittent BP in 90s, so will not restart norvasc at this time.     LOS: 4 days    Stark Klein 01/09/2018

## 2018-01-09 NOTE — Evaluation (Signed)
Physical Therapy Evaluation Patient Details Name: Lauren Richard MRN: [id number] DOB: 04-08-1936 Today's Date: 01/09/2018   History of Present Illness  pt is an 82 y/o female who presents for follow up for pancreatic CA.  Recent hx included 10 days of N/V/anorexia where a mass was found at the head of the pancreas.  Also underwent ERCP with stenting.  AKI was reversed.  Pt now back to her "normal"  On 3/28 pt underwent laproscopic resection of the tumor, stenting of the pancreatic duct, Whipple procedure  PMH includes, HTN, arthritis,  Clinical Impression  Pt admitted with/for pancreatic CA, s/p resection and Whipple procedure.  Pt needing mod to min assist for basic mobility and gait.Marland Kitchen  Pt currently limited functionally due to the problems listed. ( See problems list.)   Pt will benefit from PT to maximize function and safety in order to get ready for next venue listed below.     Follow Up Recommendations SNF    Equipment Recommendations  None recommended by PT    Recommendations for Other Services       Precautions / Restrictions Precautions Precautions: Fall      Mobility  Bed Mobility Overal bed mobility: Needs Assistance Bed Mobility: Rolling;Sidelying to Sit Rolling: Mod assist Sidelying to sit: Mod assist;+2 for safety/equipment;+2 for physical assistance       General bed mobility comments: Pt instructed to log roll to reduce stress on incision and decreased pain   Transfers Overall transfer level: Needs assistance Equipment used: None;Rolling walker (2 wheeled) Transfers: Sit to/from [employer] Transfers Sit to Stand: Min assist;+2 safety/equipment;+2 physical assistance Stand pivot transfers: Min assist;+2 safety/equipment       General transfer comment: Pt requires cues for hand placement and assist to power up into standing   Ambulation/Gait Ambulation/Gait assistance: Min assist;+2 safety/equipment Ambulation Distance (Feet): 140 Feet Assistive  device: Rolling walker (2 wheeled) Gait Pattern/deviations: Step-through pattern   Gait velocity interpretation: Below normal speed for age/gender General Gait Details: slow, guarded steps.  Generally steady  Stairs            Wheelchair Mobility    Modified Rankin (Stroke Patients Only)       Balance Overall balance assessment: Needs assistance Sitting-balance support: Feet supported Sitting balance-Leahy Scale: Fair     Standing balance support: Bilateral upper extremity supported Standing balance-Leahy Scale: Poor                               Pertinent Vitals/Pain Pain Assessment: Faces Faces Pain Scale: Hurts little more Pain Location: abdomen  Pain Descriptors / Indicators: Pressure Pain Intervention(s): Monitored during session;PCA encouraged;Repositioned    Home Living Family/patient expects to be discharged to:: Private residence Living Arrangements: Alone Available Help at Discharge: Family;Available PRN/intermittently Type of Home: Apartment Home Access: Stairs to enter Entrance Stairs-Rails: Right;Left Entrance Stairs-Number of Steps: full flight  Home Layout: One level Home Equipment: Wheelchair - [employer] - 2 wheels;Other (comment);Shower seat      Prior Function Level of Independence: Independent         Comments: Pt does not drive, but ambulates through grocery store.   Is independent with IADLs, does not use AE      Hand Dominance   Dominant Hand: Right    Extremity/Trunk Assessment   Upper Extremity Assessment Upper Extremity Assessment: Generalized weakness    Lower Extremity Assessment Lower Extremity Assessment: Generalized weakness    Cervical / Trunk  Assessment Cervical / Trunk Assessment: Other exceptions Cervical / Trunk Exceptions: flexed posture due to abdominal pain   Communication   Communication: No difficulties  Cognition Arousal/Alertness: Awake/alert Behavior During Therapy: WFL for tasks  assessed/performed Overall Cognitive Status: Within Functional Limits for tasks assessed                                        General Comments General comments (skin integrity, edema, etc.): VSS on RA    Exercises     Assessment/Plan    PT Assessment Patient needs continued PT services  PT Problem List Decreased strength;Decreased activity tolerance;Decreased mobility;Pain       PT Treatment Interventions DME instruction;Gait training;Stair training;Functional mobility training;Therapeutic activities;Patient/family education    PT Goals (Current goals can be found in the Care Plan section)  Acute Rehab PT Goals Patient Stated Goal: to get stronger  PT Goal Formulation: With patient Time For Goal Achievement: 01/23/18 Potential to Achieve Goals: Good    Frequency Min 3X/week   Barriers to discharge        Co-evaluation PT/OT/SLP Co-Evaluation/Treatment: Yes Reason for Co-Treatment: For patient/therapist safety PT goals addressed during session: Mobility/safety with mobility OT goals addressed during session: ADL's and self-care       AM-PAC PT "6 Clicks" Daily Activity  Outcome Measure Difficulty turning over in bed (including adjusting bedclothes, sheets and blankets)?: Unable Difficulty moving from lying on back to sitting on the side of the bed? : Unable Difficulty sitting down on and standing up from a chair with arms (e.g., wheelchair, bedside commode, etc,.)?: Unable Help needed moving to and from a bed to chair (including a wheelchair)?: A Little Help needed walking in hospital room?: A Little Help needed climbing 3-5 steps with a railing? : A Little 6 Click Score: 12    End of Session   Activity Tolerance: Patient tolerated treatment well Patient left: in chair;with call bell/phone within reach;with family/visitor present Nurse Communication: Mobility status PT Visit Diagnosis: Other abnormalities of gait and mobility (R26.89);Muscle  weakness (generalized) (M62.81);Difficulty in walking, not elsewhere classified (R26.2)    Time: 2563-8937 PT Time Calculation (min) (ACUTE ONLY): 33 min   Charges:   PT Evaluation $PT Eval Moderate Complexity: 1 Mod     PT G Codes:        2018/01/28  Donnella Sham, PT [phone]  (pager)  Tessie Fass Kwinton Maahs 28-Jan-2018, 6:22 PM

## 2018-01-10 LAB — CBC
HEMATOCRIT: 29.3 % — AB (ref 36.0–46.0)
HEMOGLOBIN: 9.2 g/dL — AB (ref 12.0–15.0)
MCH: 28.6 pg (ref 26.0–34.0)
MCHC: 31.4 g/dL (ref 30.0–36.0)
MCV: 91 fL (ref 78.0–100.0)
Platelets: 230 10*3/uL (ref 150–400)
RBC: 3.22 MIL/uL — ABNORMAL LOW (ref 3.87–5.11)
RDW: 13.5 % (ref 11.5–15.5)
WBC: 7.9 10*3/uL (ref 4.0–10.5)

## 2018-01-10 LAB — URINALYSIS, COMPLETE (UACMP) WITH MICROSCOPIC
BILIRUBIN URINE: NEGATIVE
Glucose, UA: NEGATIVE mg/dL
KETONES UR: NEGATIVE mg/dL
NITRITE: NEGATIVE
PH: 6 (ref 5.0–8.0)
PROTEIN: NEGATIVE mg/dL
Specific Gravity, Urine: 1.017 (ref 1.005–1.030)

## 2018-01-10 LAB — COMPREHENSIVE METABOLIC PANEL
ALK PHOS: 55 U/L (ref 38–126)
ALT: 12 U/L — ABNORMAL LOW (ref 14–54)
ANION GAP: 10 (ref 5–15)
AST: 14 U/L — ABNORMAL LOW (ref 15–41)
Albumin: 3 g/dL — ABNORMAL LOW (ref 3.5–5.0)
BILIRUBIN TOTAL: 0.6 mg/dL (ref 0.3–1.2)
BUN: <5 mg/dL — ABNORMAL LOW (ref 6–20)
CALCIUM: 9.4 mg/dL (ref 8.9–10.3)
CO2: 20 mmol/L — ABNORMAL LOW (ref 22–32)
Chloride: 106 mmol/L (ref 101–111)
Creatinine, Ser: 0.69 mg/dL (ref 0.44–1.00)
GFR calc Af Amer: >60 mL/min (ref >60)
Glucose, Bld: 132 mg/dL — ABNORMAL HIGH (ref 65–99)
POTASSIUM: 3.9 mmol/L (ref 3.5–5.1)
Sodium: 136 mmol/L (ref 135–145)
TOTAL PROTEIN: 6.6 g/dL (ref 6.5–8.1)

## 2018-01-10 LAB — MAGNESIUM: MAGNESIUM: 1.6 mg/dL — AB (ref 1.7–2.4)

## 2018-01-10 LAB — PHOSPHORUS: Phosphorus: 3.3 mg/dL (ref 2.5–4.6)

## 2018-01-10 MED ORDER — SODIUM CHLORIDE 0.9 % IV SOLN
1.0000 g | INTRAVENOUS | Status: DC
  Administered 2018-01-10 – 2018-01-12 (×3): 1 g via INTRAVENOUS
  Filled 2018-01-10 (×5): qty 10

## 2018-01-10 NOTE — NC FL2 (Signed)
[place] LEVEL OF CARE SCREENING TOOL     IDENTIFICATION  Patient Name: Lauren Richard Birthdate: [birth year] Sex: female Admission Date (Current Location): 01/05/2018  [facility] and [place] Number:  Herbalist and Address:  The . [facility], [place] [address] [place], [place] [address]      Provider Number: [id number]  Attending Physician Name and Address:  Stark Klein, MD  Relative Name and Phone Number:       Current Level of Care: Hospital Recommended Level of Care: [place] Prior Approval Number:    Date Approved/Denied:   PASRR Number: [id number]  Discharge Plan: SNF    Current Diagnoses: Patient Active Problem List   Diagnosis Date Noted  . S/P laparoscopy 01/05/2018  . Adenocarcinoma of head of pancreas ([place]) 11/24/2017  . Acute kidney injury superimposed on chronic kidney disease ([place])   . Hypokalemia   . Anemia   . Biliary obstruction   . Acute kidney injury ([place]) 11/03/2017  . Pancreatic mass 11/03/2017  . Osteoarthritis of left knee 04/26/2017  . S/P total knee replacement using cement, left 04/26/2017  . Essential hypertension 03/03/2017  . Hyperlipidemia 03/03/2017  . Osteoarthritis 03/03/2017  . Obesity 03/03/2017  . Status post left foot surgery 12/03/2015  . Hammertoe 11/03/2015    Orientation RESPIRATION BLADDER Height & Weight     Self, Time, Situation, Place  Normal Continent, Indwelling catheter(foley to be removed on 4/2) Weight: 201 lb 1 oz (91.2 kg) Height:  5\' 7"  (170.2 cm)  BEHAVIORAL SYMPTOMS/MOOD NEUROLOGICAL BOWEL NUTRITION STATUS      Continent Diet(see discharge summary)  AMBULATORY STATUS COMMUNICATION OF NEEDS Skin   Limited Assist Verbally Surgical wounds(surgical incision on abdomen; currently has two JP drains)                       Personal Care Assistance Level of Assistance  Bathing, Feeding, Dressing Bathing Assistance: Limited assistance Feeding  assistance: Independent Dressing Assistance: Limited assistance     Functional Limitations Info  Sight, Hearing, Speech Sight Info: Adequate Hearing Info: Adequate Speech Info: Adequate    SPECIAL CARE FACTORS FREQUENCY  PT (By licensed PT), OT (By licensed OT)     PT Frequency: 5x week OT Frequency: 5x week            Contractures Contractures Info: Not present    Additional Factors Info  Code Status, Allergies Code Status Info: Full Code Allergies Info: No Known Allergies           Current Medications (01/10/2018):  This is the current hospital active medication list Current Facility-Administered Medications  Medication Dose Route Frequency Provider Last Rate Last Dose  . 0.9 %  sodium chloride infusion   Intravenous Once Stark Klein, MD      . acetaminophen (TYLENOL) tablet 650 mg  650 mg Oral Q6H PRN Stark Klein, MD   650 mg at 01/08/18 2043  . atenolol (TENORMIN) tablet 25 mg  25 mg Oral Daily Stark Klein, MD   25 mg at 01/10/18 1023  . chlorhexidine (PERIDEX) 0.12 % solution 15 mL  15 mL Mouth Rinse BID Stark Klein, MD   15 mL at 01/10/18 1023  . Chlorhexidine Gluconate Cloth 2 % PADS 6 each  6 each Topical Daily Stark Klein, MD   6 each at 01/10/18 1023  . dextrose 5 % and 0.45 % NaCl with KCl 20 mEq/L infusion   Intravenous Continuous Stark Klein,  MD 30 mL/hr at 01/09/18 1037    . diphenhydrAMINE (BENADRYL) 12.5 MG/5ML elixir 12.5 mg  12.5 mg Oral Q6H PRN Stark Klein, MD       Or  . diphenhydrAMINE (BENADRYL) injection 12.5 mg  12.5 mg Intravenous Q6H PRN Stark Klein, MD      . fentaNYL (SUBLIMAZE) injection 25-50 mcg  25-50 mcg Intravenous Q2H PRN Stark Klein, MD   50 mcg at 01/06/18 2106  . fentaNYL 40 mcg/mL PCA injection   Intravenous Q4H Stark Klein, MD   1,000 mcg at 01/09/18 1524  . hydrALAZINE (APRESOLINE) injection 20 mg  20 mg Intravenous Q2H PRN Stark Klein, MD      . MEDLINE mouth rinse  15 mL Mouth Rinse q12n4p Stark Klein, MD    15 mL at 01/08/18 1533  . methocarbamol (ROBAXIN) 500 mg in dextrose 5 % 50 mL IVPB  500 mg Intravenous Q8H PRN Stark Klein, MD   Stopped at 01/06/18 1931  . ondansetron (ZOFRAN-ODT) disintegrating tablet 4 mg  4 mg Oral Q6H PRN Stark Klein, MD       Or  . ondansetron (ZOFRAN) injection 4 mg  4 mg Intravenous Q6H PRN Stark Klein, MD   4 mg at 01/10/18 0409  . pantoprazole (PROTONIX) injection 40 mg  40 mg Intravenous QHS Stark Klein, MD   40 mg at 01/09/18 2238  . ropivacaine (PF) 2 mg/mL (0.2%) (NAROPIN) injection  10 mL/hr Epidural Continuous Stark Klein, MD   Stopped at 01/09/18 1923  . sodium chloride flush (NS) 0.9 % injection 10-40 mL  10-40 mL Intracatheter Q12H Stark Klein, MD   10 mL at 01/10/18 1023  . sodium chloride flush (NS) 0.9 % injection 10-40 mL  10-40 mL Intracatheter PRN Stark Klein, MD         Discharge Medications: Please see discharge summary for a list of discharge medications.  Relevant Imaging Results:  Relevant Lab Results:   Additional Information SS# [place], [place]

## 2018-01-10 NOTE — Care Management Note (Signed)
Case Management Note  Patient Details  Name: Lauren Richard MRN: [id number] Date of Birth: [birth year]  Subjective/Objective:    Pt admitted with/for pancreatic CA, s/p resection and Whipple procedure.  PTA, pt independent,lives at home alone.                 Action/Plan: PT/OT recommending SNF at discharge; CSW consulted to facilitate possible dc to SNF upon medical stability.    Expected Discharge Date:                  Expected Discharge Plan:  Skilled Nursing Facility  In-House Referral:  Clinical Social Work  Discharge planning Services  CM Consult  Post Acute Care Choice:    Choice offered to:     DME Arranged:    DME Agency:     HH Arranged:    [place] Agency:     Status of Service:  In process, will continue to follow  If discussed at Long Length of Stay Meetings, dates discussed:    Additional Comments:  Reinaldo Raddle, RN, BSN  Trauma/Neuro ICU Case Manager [phone]

## 2018-01-10 NOTE — Progress Notes (Addendum)
5 Days Post-Op   Subjective/Chief Complaint: 2 x emesis in last 24 hours with fulls, backed off to clear liq.  Urinalysis not sent.  Dressing removed.     Objective: Vital signs in last 24 hours: Temp:  [98 F (36.7 C)-99.4 F (37.4 C)] 98.6 F (37 C) (04/02 0700) Pulse Rate:  [70-84] 70 (04/02 0000) Resp:  [20-27] 25 (04/02 0400) BP: (132-137)/(65-78) 132/65 (04/02 0000) SpO2:  [97 %-100 %] 98 % (04/02 0400) Last BM Date: 01/04/18  Intake/Output from previous day: 04/01 0701 - 04/02 0700 In: 840 [P.O.:840] Out: 1185 [Urine:1075; Drains:110] Intake/Output this shift: No intake/output data recorded.  General appearance: alert, cooperative and no distress.   Resp: breathing comfortably Cardio: regular rate and rhythm, occ PVCs GI: soft, mildly distended, non tender.  Wound c/d/i. Extremities: extremities normal, atraumatic, no cyanosis or edema  Lab Results:  Recent Labs    01/09/18 0600 01/10/18 0525  WBC 8.7 7.9  HGB 8.6* 9.2*  HCT 25.8* 29.3*  PLT 164 230   BMET Recent Labs    01/09/18 0600 01/10/18 0525  NA 136 136  K 4.0 3.9  CL 111 106  CO2 21* 20*  GLUCOSE 118* 132*  BUN 6 <5*  CREATININE 0.85 0.69  CALCIUM 8.9 9.4   PT/INR No results for input(s): LABPROT, INR in the last 72 hours. ABG No results for input(s): PHART, HCO3 in the last 72 hours.  Invalid input(s): PCO2, PO2  Studies/Results: Dg Chest 2 View  Result Date: 01/09/2018 CLINICAL DATA:  Fevers EXAM: CHEST - 2 VIEW COMPARISON:  10/18/17 FINDINGS: Cardiac shadow is mildly prominent but accentuated by the frontal technique. Right jugular central line is noted in satisfactory position. No pneumothorax is seen. The lungs are clear. No bony abnormality is noted. IMPRESSION: No active cardiopulmonary disease. Electronically Signed   By: Inez Catalina M.D.   On: 01/09/2018 10:06    Anti-infectives: Anti-infectives (From admission, onward)   Start     Dose/Rate Route Frequency Ordered Stop   01/05/18 2000  ceFAZolin (ANCEF) IVPB 2g/100 mL premix     2 g 200 mL/hr over 30 Minutes Intravenous Every 8 hours 01/05/18 1454 01/05/18 2058   01/05/18 0555  ceFAZolin (ANCEF) 2-4 GM/100ML-% IVPB    Note to Pharmacy:  Nyoka Cowden   : cabinet override      01/05/18 0555 01/05/18 0806   01/05/18 0548  ceFAZolin (ANCEF) IVPB 2g/100 mL premix     2 g 200 mL/hr over 30 Minutes Intravenous On call to O.R. 01/05/18 0548 01/05/18 1206      Assessment/Plan: s/p Procedure(s) with comments: LAPAROSCOPY DIAGNOSTIC ERAS PATHWAY (N/A) - EDIDURAL WHIPPLE PROCEDURE (N/A) Pancreatic adenocarcinoma, path back pT3N1  HTN- IV metoprolol, PRN hydralazine Hypotension resolved.   Hypomagnesemia, hypokalemia and hypophosphatemia - improved.  Acute blood loss anemia- stable, improving.   Hyperglycemia - stress induced.  Stable.  May need treatment given portion of pancreas removed and presence of chronic pancreatitis.   Moderate protein calorie malutrition.  Lovenox and SCDs for VTE ppx Protonix for GI ppx. OK to convert to oral.   Keep fentanyl pca given nausea.     Fever - u/a not sent.  cxr normal.   Urinary retention- foley replaced.  D/c today now that epidural is out. Tenormin for HTN Sw consult  LOS: 5 days    Stark Klein 01/10/2018

## 2018-01-10 NOTE — Clinical Social Work Note (Signed)
Clinical Social Work Assessment  Patient Details  Name: Lauren Richard MRN: [id number] Date of Birth: [birth year]  Date of referral:  01/10/18               Reason for consult:  Facility Placement                Permission sought to share information with:  Family Supports, Lauren Richard granted to share information::  Yes, Verbal Permission Granted  Name::        Agency::  [facility]  Relationship::  Acupuncturist Information:     Housing/Transportation Living arrangements for the past 2 months:  Apartment Source of Information:  Patient Patient Interpreter Needed:  None Criminal Activity/Legal Involvement Pertinent to Current Situation/Hospitalization:  No - Comment as needed Significant Relationships:  Warehouse manager, Siblings, Other Family Members Lives with:  Self Do you feel safe going back to the place where you live?  Yes Need for family participation in patient care:  Yes (Comment)  Care giving concerns:Pt lives alone in a second story apartment. Pt states that she has help but not 24/7 is amenable to SNF recommendation.    Social Worker assessment / plan:  CSW met with pt at bedside, pt states that she feels much better following bouts of nausea the evening before. Pt states that she lives by herself in an apartment on a second floor. Pt states that she has good support from a family, including her three siblings (all girls). Pt gets most support from her niece and nephew and is appreciative of their help. Pt has previously had a knee replacement and went to [facility] where she had a positive experience. Pt reflected on her independence and life course. 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   CSW has faxed out pt with preference for [facility] and to other local SNFs as back up. Continuing to follow for medical stability and  discharge. CSW has also initiated insurance authorization through [employer].   Employment status:  Retired Forensic scientist:  Managed Care PT Recommendations:  [place] / Referral to community resources:  [place]  Patient/Family's Response to care:  Pt understands CSW role, was appreciative of SNF referral and connection back to [facility].   Patient/Family's Understanding of and Emotional Response to Diagnosis, Current Treatment, and Prognosis: Pt states understanding of diagnosis, current treatment, and prognosis. Pt states realistic expectations for progress and has hope for return home after rehab. Pt was emotionally positive during assessment despite nausea the previous evening. Pt expressed gratitude for CSW visit and conversation.   Emotional Assessment Appearance:  Appears stated age, Well-Groomed Attitude/Demeanor/Rapport:  Gracious, Charismatic, Engaged Affect (typically observed):  Accepting, Adaptable, Pleasant, Appropriate, Hopeful Orientation:  Oriented to Self, Oriented to  Time, Oriented to Place, Oriented to Situation Alcohol / Substance use:  Not Applicable Psych involvement (Current and /or in the community):  No (Comment)  Discharge Needs  Concerns to be addressed:  Care Coordination, Discharge Planning Concerns Readmission within the last 30 days:  No Current discharge risk:  Lives alone, Physical Impairment Barriers to Discharge:  Continued Medical Work up, [employer], [place] 01/10/2018, 12:53 PM

## 2018-01-11 LAB — PHOSPHORUS: PHOSPHORUS: 3.1 mg/dL (ref 2.5–4.6)

## 2018-01-11 LAB — MAGNESIUM: Magnesium: 1.5 mg/dL — ABNORMAL LOW (ref 1.7–2.4)

## 2018-01-11 MED ORDER — OXYCODONE HCL 5 MG PO TABS: 5.0000 mg | ORAL_TABLET | Freq: Four times a day (QID) | ORAL | Status: DC | PRN

## 2018-01-11 MED ORDER — SENNOSIDES-DOCUSATE SODIUM 8.6-50 MG PO TABS
1.0000 | ORAL_TABLET | Freq: Two times a day (BID) | ORAL | Status: DC
  Administered 2018-01-11 – 2018-01-14 (×7): 1 via ORAL
  Filled 2018-01-11 (×6): qty 1

## 2018-01-11 MED ORDER — TRAMADOL HCL 50 MG PO TABS
50.0000 mg | ORAL_TABLET | Freq: Four times a day (QID) | ORAL | Status: DC | PRN
  Filled 2018-01-11: qty 1

## 2018-01-11 MED ORDER — POLYETHYLENE GLYCOL 3350 17 G PO PACK
17.0000 g | PACK | Freq: Every day | ORAL | Status: DC | PRN
  Administered 2018-01-12: 17 g via ORAL
  Filled 2018-01-11: qty 1

## 2018-01-11 MED ORDER — PANTOPRAZOLE SODIUM 40 MG PO TBEC
40.0000 mg | DELAYED_RELEASE_TABLET | Freq: Every day | ORAL | Status: DC
  Administered 2018-01-11 – 2018-01-14 (×4): 40 mg via ORAL
  Filled 2018-01-11 (×4): qty 1

## 2018-01-11 MED ORDER — MAGNESIUM SULFATE 2 GM/50ML IV SOLN: 2.0000 g | Freq: Once | INTRAVENOUS | Status: DC

## 2018-01-11 MED ORDER — MAGNESIUM OXIDE 400 (241.3 MG) MG PO TABS
400.0000 mg | ORAL_TABLET | Freq: Two times a day (BID) | ORAL | Status: DC
  Administered 2018-01-11 – 2018-01-14 (×7): 400 mg via ORAL
  Filled 2018-01-11 (×7): qty 1

## 2018-01-11 MED ORDER — BOOST / RESOURCE BREEZE PO LIQD CUSTOM
1.0000 | Freq: Three times a day (TID) | ORAL | Status: DC
  Administered 2018-01-11 – 2018-01-14 (×7): 1 via ORAL

## 2018-01-11 MED ORDER — GABAPENTIN 100 MG PO CAPS
200.0000 mg | ORAL_CAPSULE | Freq: Two times a day (BID) | ORAL | Status: DC
  Administered 2018-01-11 – 2018-01-14 (×7): 200 mg via ORAL
  Filled 2018-01-11 (×7): qty 2

## 2018-01-11 NOTE — Progress Notes (Signed)
This RN wasted 72mL of fentanyl PCA with Mika RN upon d/c of PCA.

## 2018-01-11 NOTE — Social Work (Addendum)
CSW awaiting [employer] auth, pt will have bed at [facility] when medically appropriate.  1:30pm- CSW has authorization from [facility] [phone].   CSW continuing to follow.  Alexander Mt, MSW, [place] Work [phone]

## 2018-01-11 NOTE — Progress Notes (Signed)
6 Days Post-Op   Subjective/Chief Complaint: No emesis.  U/a positive.  Pt started on ceftriaxone after urine cx obtained.  Used minimal PCA in last few days.  Was able to void this time after foley was removed.  Continues to pass gas, but no BM yet.     Objective: Vital signs in last 24 hours: Temp:  [97.9 F (36.6 C)-98.9 F (37.2 C)] 98.2 F (36.8 C) (04/03 0808) Pulse Rate:  [67-78] 67 (04/03 0400) Resp:  [18-22] 18 (04/03 0400) BP: (123-149)/(59-87) 149/65 (04/03 0400) SpO2:  [97 %-98 %] 98 % (04/03 0400) Last BM Date: 01/04/18  Intake/Output from previous day: 04/02 0701 - 04/03 0700 In: 990 [P.O.:720; I.V.:270] Out: 1220 [Urine:1025; Drains:195] Intake/Output this shift: No intake/output data recorded.  General appearance: alert, cooperative and no distress.   Resp: breathing comfortably Cardio: regular rate and rhythm, occ PVCs GI: soft, mildly distended, non tender.  Wound c/d/i. Extremities: extremities normal, atraumatic, no cyanosis or edema  Lab Results:  Recent Labs    01/09/18 0600 01/10/18 0525  WBC 8.7 7.9  HGB 8.6* 9.2*  HCT 25.8* 29.3*  PLT 164 230   BMET Recent Labs    01/09/18 0600 01/10/18 0525  NA 136 136  K 4.0 3.9  CL 111 106  CO2 21* 20*  GLUCOSE 118* 132*  BUN 6 <5*  CREATININE 0.85 0.69  CALCIUM 8.9 9.4   PT/INR No results for input(s): LABPROT, INR in the last 72 hours. ABG No results for input(s): PHART, HCO3 in the last 72 hours.  Invalid input(s): PCO2, PO2  Studies/Results: No results found.  Anti-infectives: Anti-infectives (From admission, onward)   Start     Dose/Rate Route Frequency Ordered Stop   01/10/18 1800  cefTRIAXone (ROCEPHIN) 1 g in sodium chloride 0.9 % 100 mL IVPB     1 g 200 mL/hr over 30 Minutes Intravenous Every 24 hours 01/10/18 1704     01/05/18 2000  ceFAZolin (ANCEF) IVPB 2g/100 mL premix     2 g 200 mL/hr over 30 Minutes Intravenous Every 8 hours 01/05/18 1454 01/05/18 2058   01/05/18  0555  ceFAZolin (ANCEF) 2-4 GM/100ML-% IVPB    Note to Pharmacy:  Nyoka Cowden   : cabinet override      01/05/18 0555 01/05/18 0806   01/05/18 0548  ceFAZolin (ANCEF) IVPB 2g/100 mL premix     2 g 200 mL/hr over 30 Minutes Intravenous On call to O.R. 01/05/18 8299 01/05/18 1206      Assessment/Plan: s/p Procedure(s) with comments: LAPAROSCOPY DIAGNOSTIC ERAS PATHWAY (N/A) - EDIDURAL WHIPPLE PROCEDURE (N/A) Pancreatic adenocarcinoma, path back pT3N1  HTN- switched to tenormin.   Hypotension resolved.   Hypomagnesemia, hypokalemia and hypophosphatemia - improved.  Acute blood loss anemia- stable, improving.   Hyperglycemia - stress induced.  Stable.   Moderate protein calorie malutrition. Nutrition consult today.  Advance to full liquids.   Lovenox and SCDs for VTE ppx Protonix for GI ppx. OK to convert to oral.   D/c PCA. UTI Urinary retention-resolved.   Sw consult for snf placement D/c one drain.  (#2)   LOS: 6 days    Stark Klein 01/11/2018

## 2018-01-11 NOTE — Progress Notes (Signed)
Initial Nutrition Assessment  DOCUMENTATION CODES:   Obesity unspecified  INTERVENTION:  Provide Boost Breeze po TID, each supplement provides 250 kcal and 9 grams of protein  Encourage adequate PO intake.   NUTRITION DIAGNOSIS:   Increased nutrient needs related to chronic illness(pancreatic cancer) as evidenced by estimated needs.  GOAL:   Patient will meet greater than or equal to 90% of their needs  MONITOR:   PO intake, Supplement acceptance, Diet advancement, Labs, I & O's, Skin, Weight trends  REASON FOR ASSESSMENT:   Consult Assessment of nutrition requirement/status  ASSESSMENT:   82 year old female with history of pancreatic adenocarcinoma, arthritis, HTN.   S/p Procedure(01/05/18): LAPAROSCOPY DIAGNOSTIC ERAS PATHWAY WHIPPLE PROCEDURE  Diet has been advanced to a full liquid diet this AM. Pt currently waiting on lunch with hopes she will be able to tolerate it. Pt reports appetite and hunger is increasing. Pt does report in January 2019 pt with poor appetite and po intake, however reports more recently prior to admission her appetite had come back and was eating great with usual consumption of 3 meals a day with snacks in between. Pt with no significant weight loss per weight records. Meal completion on clear liquid diet has been 50%. Pt is agreeable to nutritional supplements to aid in caloric and protein needs. RD to order [facility]. Pt reports she has consumed Boost Breeze before and enjoys the taste.   Labs and medications reviewed. Potassium, phosphorous WNL. Magnesium low at 1.5.  NUTRITION - FOCUSED PHYSICAL EXAM:    Most Recent Value  Orbital Region  No depletion  Upper Arm Region  No depletion  Thoracic and Lumbar Region  No depletion  Buccal Region  Unable to assess  Temple Region  Unable to assess  Clavicle Bone Region  Mild depletion  Clavicle and Acromion Bone Region  Mild depletion  Scapular Bone Region  Unable to assess  Dorsal Hand   Unable to assess  Patellar Region  No depletion  Anterior Thigh Region  No depletion  Posterior Calf Region  No depletion  Edema (RD Assessment)  None  Hair  Reviewed  Eyes  Reviewed  Mouth  Reviewed  Skin  Reviewed  Nails  Reviewed       Diet Order:  Diet full liquid Room service appropriate? Yes; Fluid consistency: Thin  EDUCATION NEEDS:   Not appropriate for education at this time  Skin:  Skin Assessment: Skin Integrity Issues: Skin Integrity Issues:: Incisions Incisions: abdomen  Last BM:  3/27  Height:   Ht Readings from Last 1 Encounters:  01/05/18 5\' 7"  (1.702 m)    Weight:   Wt Readings from Last 1 Encounters:  01/09/18 201 lb 1 oz (91.2 kg)    Ideal Body Weight:  61.36 kg  BMI:  Body mass index is 31.49 kg/m.  Estimated Nutritional Needs:   Kcal:  1750-1900  Protein:  90-105 grams  Fluid:  1.7-1.9 L/day    Corrin Parker, MS, RD, LDN Pager # [phone] After hours/ weekend pager # [phone]

## 2018-01-11 NOTE — Progress Notes (Signed)
Physical Therapy Treatment Patient Details Name: Lauren Richard MRN: [id number] DOB: [birth year] Today's Date: 01/11/2018    History of Present Illness pt is an 82 y/o female who presents for follow up for pancreatic CA.  Recent hx included 10 days of N/V/anorexia where a mass was found at the head of the pancreas.  Also underwent ERCP with stenting.  AKI was reversed.  Pt now back to her "normal"  On 3/28 pt underwent laproscopic resection of the tumor, stenting of the pancreatic duct, Whipple procedure  PMH includes, HTN, arthritis,    PT Comments    Pt much improved from activity tolerance stand point. Pt with minimal pain and increasing ambulation tolerance. Pt does live by herself and would benefit from ST-SNF upon d/c to achieve safe mod I level of function. Acute PT to con't to follow.   Follow Up Recommendations  SNF     Equipment Recommendations  None recommended by PT    Recommendations for Other Services       Precautions / Restrictions Precautions Precautions: Fall Restrictions Weight Bearing Restrictions: No    Mobility  Bed Mobility               General bed mobility comments: pt up in chair upon PT arrival  Transfers Overall transfer level: Needs assistance Equipment used: Rolling walker (2 wheeled) Transfers: Sit to/from Stand Sit to Stand: Min assist         General transfer comment: v/c's for safe hand placement, increased time, minA to initially power up  Ambulation/Gait Ambulation/Gait assistance: Min assist Ambulation Distance (Feet): 165 Feet Assistive device: Rolling walker (2 wheeled) Gait Pattern/deviations: Step-through pattern;Decreased stride length;Trunk flexed   Gait velocity interpretation: Below normal speed for age/gender General Gait Details: v/c's to stay in walker and stand up right, decreased pace   Stairs            Wheelchair Mobility    Modified Rankin (Stroke Patients Only)       Balance Overall balance  assessment: Needs assistance Sitting-balance support: No upper extremity supported;Feet supported Sitting balance-Leahy Scale: Good     Standing balance support: No upper extremity supported Standing balance-Leahy Scale: Fair                              Cognition Arousal/Alertness: Awake/alert Behavior During Therapy: WFL for tasks assessed/performed Overall Cognitive Status: Within Functional Limits for tasks assessed                                        Exercises      General Comments        Pertinent Vitals/Pain Pain Assessment: 0-10 Pain Score: 1  Pain Location: abdomen Pain Descriptors / Indicators: Sore Pain Intervention(s): Monitored during session    Home Living                      Prior Function            PT Goals (current goals can now be found in the care plan section) Acute Rehab PT Goals Patient Stated Goal: go to rehab Progress towards PT goals: Progressing toward goals    Frequency    Min 3X/week      PT Plan Current plan remains appropriate    Co-evaluation  AM-PAC PT "6 Clicks" Daily Activity  Outcome Measure  Difficulty turning over in bed (including adjusting bedclothes, sheets and blankets)?: Unable Difficulty moving from lying on back to sitting on the side of the bed? : Unable Difficulty sitting down on and standing up from a chair with arms (e.g., wheelchair, bedside commode, etc,.)?: A Little Help needed moving to and from a bed to chair (including a wheelchair)?: A Little Help needed walking in hospital room?: A Little Help needed climbing 3-5 steps with a railing? : A Little 6 Click Score: 14    End of Session   Activity Tolerance: Patient tolerated treatment well Patient left: in chair;with call bell/phone within reach;with family/visitor present Nurse Communication: Mobility status PT Visit Diagnosis: Other abnormalities of gait and mobility (R26.89);Muscle  weakness (generalized) (M62.81);Difficulty in walking, not elsewhere classified (R26.2)     Time: 7972-8206 PT Time Calculation (min) (ACUTE ONLY): 19 min  Charges:  $Gait Training: 8-22 mins                    G Codes:       Kittie Plater, PT, DPT Pager #: [phone] Office #: [phone]    [place] 01/11/2018, 2:20 PM

## 2018-01-12 LAB — URINE CULTURE

## 2018-01-12 MED ORDER — PRO-STAT SUGAR FREE PO LIQD
30.0000 mL | Freq: Two times a day (BID) | ORAL | Status: DC
  Administered 2018-01-12 – 2018-01-14 (×4): 30 mL via ORAL
  Filled 2018-01-12 (×3): qty 30

## 2018-01-12 NOTE — Progress Notes (Signed)
Occupational Therapy Progress Note (late entry)  Pt is making excellent progress.  She requires min A for ADLs and min A for functional mobility.  Continue to recommend SNF level rehab as she lives alone in second floor apartment.     01/11/18 1500  OT Visit Information  Last OT Received On 01/12/18  Assistance Needed +1  History of Present Illness pt is an 82 y/o female who presents for follow up for pancreatic CA.  Recent hx included 10 days of N/V/anorexia where a mass was found at the head of the pancreas.  Also underwent ERCP with stenting.  AKI was reversed.  Pt now back to her "normal"  On 3/28 pt underwent laproscopic resection of the tumor, stenting of the pancreatic duct, Whipple procedure  PMH includes, HTN, arthritis,  Precautions  Precautions Fall  Pain Assessment  Pain Assessment Faces  Faces Pain Scale 2  Pain Location abdomen  Pain Descriptors / Indicators Sore  Pain Intervention(s) Monitored during session  Cognition  Arousal/Alertness Awake/alert  Behavior During Therapy WFL for tasks assessed/performed  Overall Cognitive Status Within Functional Limits for tasks assessed  ADL  Overall ADL's  Needs assistance/impaired  Grooming Wash/dry hands;Wash/dry face;Min guard;Standing  Lower Body Dressing Minimal assistance;Sit to/from stand  Lower Body Dressing Details (indicate cue type and reason) Pt able to cross ankles to don/doff socks with min A   Toilet Transfer Minimal assistance;Ambulation;Comfort height toilet;Grab bars;RW  Armed forces technical officer Details (indicate cue type and reason) verbal cues for hand placement   Toileting- Water quality scientist and Hygiene Min guard;Sit to/from stand  Functional mobility during ADLs Minimal assistance;+2 for safety/equipment;+2 for physical assistance;Rolling walker  Bed Mobility  Overal bed mobility Needs Assistance  Sidelying to sit Min assist  General bed mobility comments assist for LEs   Balance  Overall balance assessment  Needs assistance  Sitting-balance support No upper extremity supported;Feet supported  Sitting balance-Leahy Scale Good  Standing balance support No upper extremity supported;During functional activity  Standing balance-Leahy Scale Fair  Transfers  Overall transfer level Needs assistance  Equipment used Rolling walker (2 wheeled)  Transfers Sit to/from Stand;Stand Pivot Transfers  Sit to Stand Min guard  Stand pivot transfers Min guard  General transfer comment verbal cues for hand placement and min A for balance   General Comments  General comments (skin integrity, edema, etc.) VSS   OT - End of Session  Equipment Utilized During Treatment Rolling walker  Activity Tolerance Patient tolerated treatment well  Patient left in bed;with call bell/phone within reach;with family/visitor present  Nurse Communication Mobility status  OT Assessment/Plan  OT Plan Discharge plan remains appropriate  OT Visit Diagnosis Unsteadiness on feet (R26.81);Pain  Pain - part of body  (abdomen )  OT Frequency (ACUTE ONLY) Min 2X/week  Follow Up Recommendations SNF;Supervision/Assistance - 24 hour  OT Equipment 3 in 1 bedside commode  AM-PAC OT "6 Clicks" Daily Activity Outcome Measure  Help from another person eating meals? 4  Help from another person taking care of personal grooming? 3  Help from another person toileting, which includes using toliet, bedpan, or urinal? 3  Help from another person bathing (including washing, rinsing, drying)? 3  Help from another person to put on and taking off regular upper body clothing? 3  Help from another person to put on and taking off regular lower body clothing? 3  6 Click Score 19  ADL G Code Conversion CK  OT Goal Progression  Progress towards OT goals Progressing toward goals  OT Time Calculation  OT Start Time (ACUTE ONLY) 1413  OT Stop Time (ACUTE ONLY) 1500  OT Time Calculation (min) 47 min  OT General Charges  $OT Visit 1 Visit  OT Treatments   $Self Care/Home Management  8-22 mins  $Therapeutic Activity 23-37 mins  [facility], OTR/L [phone]

## 2018-01-12 NOTE — Progress Notes (Signed)
7 Days Post-Op   Subjective/Chief Complaint: Pt feeling well.  No n/v with full liquids.  Both drains were pulled yesterday.    Objective: Vital signs in last 24 hours: Temp:  [97.4 F (36.3 C)-98.3 F (36.8 C)] 97.4 F (36.3 C) (04/04 1200) Pulse Rate:  [71-89] 82 (04/04 1200) Resp:  [17-21] 18 (04/04 1200) BP: (110-134)/(59-91) 132/83 (04/04 1200) SpO2:  [96 %-98 %] 96 % (04/04 1200) Last BM Date: 01/04/18(MD aware; meds adjusted 4/3)  Intake/Output from previous day: 04/03 0701 - 04/04 0700 In: 1040 [P.O.:120; I.V.:720; IV Piggyback:200] Out: -  Intake/Output this shift: Total I/O In: 240 [I.V.:240] Out: -   General appearance: alert, cooperative and no distress.   Resp: breathing comfortably Cardio: regular rate and rhythm, occ PVCs GI: soft, mildly distended, non tender.  Wound c/d/i. Extremities: extremities normal, atraumatic, no cyanosis or edema  Lab Results:  Recent Labs    01/10/18 0525  WBC 7.9  HGB 9.2*  HCT 29.3*  PLT 230   BMET Recent Labs    01/10/18 0525  NA 136  K 3.9  CL 106  CO2 20*  GLUCOSE 132*  BUN <5*  CREATININE 0.69  CALCIUM 9.4   PT/INR No results for input(s): LABPROT, INR in the last 72 hours. ABG No results for input(s): PHART, HCO3 in the last 72 hours.  Invalid input(s): PCO2, PO2  Studies/Results: No results found.  Anti-infectives: Anti-infectives (From admission, onward)   Start     Dose/Rate Route Frequency Ordered Stop   01/10/18 1800  cefTRIAXone (ROCEPHIN) 1 g in sodium chloride 0.9 % 100 mL IVPB     1 g 200 mL/hr over 30 Minutes Intravenous Every 24 hours 01/10/18 1704     01/05/18 2000  ceFAZolin (ANCEF) IVPB 2g/100 mL premix     2 g 200 mL/hr over 30 Minutes Intravenous Every 8 hours 01/05/18 1454 01/05/18 2058   01/05/18 0555  ceFAZolin (ANCEF) 2-4 GM/100ML-% IVPB    Note to Pharmacy:  Nyoka Cowden   : cabinet override      01/05/18 0555 01/05/18 0806   01/05/18 0548  ceFAZolin (ANCEF) IVPB  2g/100 mL premix     2 g 200 mL/hr over 30 Minutes Intravenous On call to O.R. 01/05/18 0548 01/05/18 1206      Assessment/Plan: s/p Procedure(s) with comments: LAPAROSCOPY DIAGNOSTIC ERAS PATHWAY (N/A) - EDIDURAL WHIPPLE PROCEDURE (N/A) Pancreatic adenocarcinoma, path back pT3N1  HTN- tenormin Acute blood loss anemia- stable, improving.   Moderate protein calorie malutrition. Bland diet, calorie counts. Lovenox and SCDs for VTE ppx Protonix for GI ppx.  UTI - on ceftriaxone Urinary retention-resolved.   Sw consult for snf placement Hope for d/c in next few days.     LOS: 7 days    Stark Klein 01/12/2018

## 2018-01-12 NOTE — Social Work (Addendum)
Pt has bed and insurance authorization for [employer], CSW awaits discharge summary and prescriptions when medically appropriate.   12:00pm- CSW spoke with MD, if pt tolerates diet she will likely be discharged Saturday or Sunday. Have updated facility, will leave handoff for weekend CSW.   CSW continuing to follow.  Alexander Mt, [place] Work [phone]

## 2018-01-12 NOTE — Progress Notes (Signed)
Spoke with Kristol with Nutritional Services, unable to order food on "Diet Bland". Upon searching in orders for bland diet, "diet soft (aka bland)" is only bland diet found.  Soft diet ordered for patient diet to be upgraded from full liquids.

## 2018-01-12 NOTE — Progress Notes (Signed)
Nutrition Follow-up  DOCUMENTATION CODES:   Obesity unspecified  INTERVENTION:  Continue Boost Breeze po TID, each supplement provides 250 kcal and 9 grams of protein  Provide 30 ml Prostat po BID, each supplement provides 100 kcal and 15 grams of protein.   48 hour calorie count initiated. RD to follow up Monday 4/819 for full results.   Encourage adequate PO intake.   NUTRITION DIAGNOSIS:   Increased nutrient needs related to chronic illness(pancreatic cancer) as evidenced by estimated needs; ongoing  GOAL:   Patient will meet greater than or equal to 90% of their needs; progressing  MONITOR:   PO intake, Supplement acceptance, Diet advancement, Labs, I & O's, Skin, Weight trends  REASON FOR ASSESSMENT:   Consult Assessment of nutrition requirement/status  ASSESSMENT:   82 year old female with history of pancreatic adenocarcinoma, arthritis, HTN.   S/pProcedure(01/05/18): LAPAROSCOPY DIAGNOSTIC ERAS PATHWAY WHIPPLE PROCEDURE  48 hour calorie count has been initiated by MD. Diet has been advanced to a soft diet. Pt reports she was able to tolerate her lunch with no other difficulties. No recent meal completion recorded, however pt reports consume most of her meal. Pt currently has Boost Breeze ordered and has been consuming most of them. RD offered Ensure, however pt refused reporting she just want to continue with Boost Breeze. RD to additionally order Prostat to aid in adequate protein needs. RD to follow up with full calorie count results on Monday 01/16/18.   Diet Order:  DIET SOFT Room service appropriate? Yes; Fluid consistency: Thin  EDUCATION NEEDS:   Not appropriate for education at this time  Skin:  Skin Assessment: Skin Integrity Issues: Skin Integrity Issues:: Incisions Incisions: abdomen  Last BM:  3/27  Height:   Ht Readings from Last 1 Encounters:  01/05/18 5\' 7"  (1.702 m)    Weight:   Wt Readings from Last 1 Encounters:  01/09/18 201 lb 1  oz (91.2 kg)    Ideal Body Weight:  61.36 kg  BMI:  Body mass index is 31.49 kg/m.  Estimated Nutritional Needs:   Kcal:  1750-1900  Protein:  90-105 grams  Fluid:  1.7-1.9 L/day    Corrin Parker, MS, RD, LDN Pager # [phone] After hours/ weekend pager # [phone]

## 2018-01-13 LAB — COMPREHENSIVE METABOLIC PANEL
ALBUMIN: 3.1 g/dL — AB (ref 3.5–5.0)
ALT: 11 U/L — ABNORMAL LOW (ref 14–54)
ANION GAP: 14 (ref 5–15)
AST: 16 U/L (ref 15–41)
Alkaline Phosphatase: 64 U/L (ref 38–126)
BUN: <5 mg/dL — ABNORMAL LOW (ref 6–20)
CHLORIDE: 107 mmol/L (ref 101–111)
CO2: 18 mmol/L — AB (ref 22–32)
Calcium: 9.3 mg/dL (ref 8.9–10.3)
Creatinine, Ser: 0.73 mg/dL (ref 0.44–1.00)
GFR calc non Af Amer: >60 mL/min (ref >60)
GLUCOSE: 127 mg/dL — AB (ref 65–99)
POTASSIUM: 3.4 mmol/L — AB (ref 3.5–5.1)
SODIUM: 139 mmol/L (ref 135–145)
Total Bilirubin: 0.5 mg/dL (ref 0.3–1.2)
Total Protein: 6.7 g/dL (ref 6.5–8.1)

## 2018-01-13 LAB — CBC
HEMATOCRIT: 30.9 % — AB (ref 36.0–46.0)
HEMOGLOBIN: 9.9 g/dL — AB (ref 12.0–15.0)
MCH: 29 pg (ref 26.0–34.0)
MCHC: 32 g/dL (ref 30.0–36.0)
MCV: 90.6 fL (ref 78.0–100.0)
Platelets: 304 10*3/uL (ref 150–400)
RBC: 3.41 MIL/uL — AB (ref 3.87–5.11)
RDW: 13.3 % (ref 11.5–15.5)
WBC: 9 10*3/uL (ref 4.0–10.5)

## 2018-01-13 LAB — PREALBUMIN: Prealbumin: 11.6 mg/dL — ABNORMAL LOW (ref 18–38)

## 2018-01-13 MED ORDER — BISACODYL 10 MG RE SUPP
10.0000 mg | Freq: Every day | RECTAL | Status: DC
  Administered 2018-01-13: 10 mg via RECTAL
  Filled 2018-01-13 (×2): qty 1

## 2018-01-13 MED ORDER — PRO-STAT SUGAR FREE PO LIQD: 30.0000 mL | Freq: Two times a day (BID) | ORAL | 0 refills | Status: DC

## 2018-01-13 MED ORDER — PANTOPRAZOLE SODIUM 40 MG PO TBEC: 40.0000 mg | DELAYED_RELEASE_TABLET | Freq: Every day | ORAL | 0 refills | Status: DC

## 2018-01-13 MED ORDER — TRAMADOL HCL 50 MG PO TABS: 50.0000 mg | ORAL_TABLET | Freq: Four times a day (QID) | ORAL | 0 refills | Status: AC | PRN

## 2018-01-13 MED ORDER — POTASSIUM CHLORIDE CRYS ER 20 MEQ PO TBCR
40.0000 meq | EXTENDED_RELEASE_TABLET | Freq: Every day | ORAL | Status: DC
  Administered 2018-01-14: 40 meq via ORAL
  Filled 2018-01-13: qty 2

## 2018-01-13 MED ORDER — MAGNESIUM OXIDE 400 (241.3 MG) MG PO TABS: 400.0000 mg | ORAL_TABLET | Freq: Two times a day (BID) | ORAL | 0 refills | Status: DC

## 2018-01-13 MED ORDER — GABAPENTIN 100 MG PO CAPS: 200.0000 mg | ORAL_CAPSULE | Freq: Two times a day (BID) | ORAL | 0 refills | Status: DC

## 2018-01-13 MED ORDER — POTASSIUM CHLORIDE CRYS ER 20 MEQ PO TBCR: 40.0000 meq | EXTENDED_RELEASE_TABLET | Freq: Every day | ORAL | 0 refills | Status: DC

## 2018-01-13 NOTE — Progress Notes (Signed)
Offered Pt a bath, at first she was agreeable. When tech began, Pt stated that she got a bath lastnight and really did not want another one right now.

## 2018-01-13 NOTE — Progress Notes (Signed)
8 Days Post-Op   Subjective/Chief Complaint: No n/v.  Did well with bland diet.  Still hasn't had BM yet.    Objective: Vital signs in last 24 hours: Temp:  [98.1 F (36.7 C)-98.7 F (37.1 C)] 98.7 F (37.1 C) (04/05 1221) Pulse Rate:  [72-86] 74 (04/05 1221) Resp:  [18-22] 21 (04/05 1221) BP: (124-145)/(64-71) 134/68 (04/05 1221) SpO2:  [96 %-98 %] 96 % (04/05 1221) Last BM Date: 01/04/18(MD aware)  Intake/Output from previous day: 04/04 0701 - 04/05 0700 In: 1180 [P.O.:360; I.V.:720; IV Piggyback:100] Out: -  Intake/Output this shift: No intake/output data recorded.  General appearance: alert, cooperative and no distress.   Resp: breathing comfortably Cardio: regular rate and rhythm, occ PVCs GI: soft, mildly distended, non tender.  Wound c/d/i. Extremities: extremities normal, atraumatic, no cyanosis or edema  Lab Results:  Recent Labs    01/13/18 1129  WBC 9.0  HGB 9.9*  HCT 30.9*  PLT 304   BMET Recent Labs    01/13/18 1129  NA 139  K 3.4*  CL 107  CO2 18*  GLUCOSE 127*  BUN <5*  CREATININE 0.73  CALCIUM 9.3   PT/INR No results for input(s): LABPROT, INR in the last 72 hours. ABG No results for input(s): PHART, HCO3 in the last 72 hours.  Invalid input(s): PCO2, PO2  Studies/Results: No results found.  Anti-infectives: Anti-infectives (From admission, onward)   Start     Dose/Rate Route Frequency Ordered Stop   01/10/18 1800  cefTRIAXone (ROCEPHIN) 1 g in sodium chloride 0.9 % 100 mL IVPB     1 g 200 mL/hr over 30 Minutes Intravenous Every 24 hours 01/10/18 1704     01/05/18 2000  ceFAZolin (ANCEF) IVPB 2g/100 mL premix     2 g 200 mL/hr over 30 Minutes Intravenous Every 8 hours 01/05/18 1454 01/05/18 2058   01/05/18 0555  ceFAZolin (ANCEF) 2-4 GM/100ML-% IVPB    Note to Pharmacy:  Nyoka Cowden   : cabinet override      01/05/18 0555 01/05/18 0806   01/05/18 0548  ceFAZolin (ANCEF) IVPB 2g/100 mL premix     2 g 200 mL/hr over 30  Minutes Intravenous On call to O.R. 01/05/18 0548 01/05/18 1206      Assessment/Plan: s/p Procedure(s) with comments: LAPAROSCOPY DIAGNOSTIC ERAS PATHWAY (N/A) - EDIDURAL WHIPPLE PROCEDURE (N/A) Pancreatic adenocarcinoma, path back pT3N1  HTN- tenormin Acute blood loss anemia- stable Recheck today.   Moderate protein calorie malutrition. Bland diet, calorie counts. Check prealbumin Lovenox and SCDs for VTE ppx Protonix for GI ppx.  UTI - on ceftriaxone Urinary retention-resolved.   Sw consult for snf placement  Hope to d/c tomorrow to SNF unless unexpected findings arise on labs.  Also will need her to have BM.     LOS: 8 days    Stark Klein 01/13/2018

## 2018-01-13 NOTE — Discharge Summary (Signed)
Physician Discharge Summary  Patient ID: Lauren Richard MRN: [id number] DOB/AGE: [birth year] 82 y.o.  Admit date: 01/05/2018 Discharge date: 01/13/2018  Admission Diagnoses: Patient Active Problem List   Diagnosis Date Noted  . S/P laparoscopy 01/05/2018  . Adenocarcinoma of head of pancreas ([place]) 11/24/2017  . Acute kidney injury superimposed on chronic kidney disease ([place])   . Hypokalemia   . Anemia   . Biliary obstruction   . Acute kidney injury ([place]) 11/03/2017  . Pancreatic mass 11/03/2017  . Osteoarthritis of left knee 04/26/2017  . S/P total knee replacement using cement, left 04/26/2017  . Essential hypertension 03/03/2017  . Hyperlipidemia 03/03/2017  . Osteoarthritis 03/03/2017  . Obesity 03/03/2017  . Status post left foot surgery 12/03/2015  . Hammertoe 11/03/2015    Discharge Diagnoses:  Active Problems:   Adenocarcinoma of head of pancreas [facility]) S/p whipple (classic pancreaticoduodenectomy) HTN UTI, hospital acquired, treated. Urinary retention, resolved. Moderate protein calorie malnutrition    Discharged Condition: stable  Hospital Course:  Pt was admitted to the ICU following diagnostic laparoscopy and classic pancreaticoduodenectomy.  She had an epidural for pain control and low dose fentanyl PCA.  Her NGT was able to be pulled on POD 2.5.  She had her foley pulled initially while epidural was still in and this had to be reinserted. She developed a fever, but UA was positive.  Once this was treated, she defervesced.  Culture did not grow anything. Her catheter was pulled after epidural was removed and she did not have any issues.  She had an initial setback when she went from clears to fulls the first time, but n/v resolved.  Her PCA was d/c'd.  She was OOB and worked well with PT.  She had some initial electrolyte abnormalities that resolved.  Her drains were removed without issue.  She had a central line removed without incident.    Consults: anesthesia  for epidural  Significant Diagnostic Studies: labs: prior to d/c.  Gluc 127, CBC with HCT 30.9, prealbumin 11.6.  K 3.4  Treatments: antibiotics: ceftriaxone and surgery: see above.    Discharge Exam: Blood pressure 134/68, pulse 74, temperature 98.7 F (37.1 C), temperature source Oral, resp. rate (!) 21, height 5\' 7"  (1.702 m), weight 91.2 kg (201 lb 1 oz), SpO2 96 %. General appearance: alert, cooperative and no distress Resp: breathing normally Cardio: regular rate and rhythm GI: soft, non tender, non distended.  wound c/d/i.  no drainage. Extremities: extremities normal, atraumatic, no cyanosis or edema  Disposition:    Allergies as of 01/13/2018   No Known Allergies     Medication List    TAKE these medications   acetaminophen 500 MG tablet Commonly known as:  TYLENOL Take 500-1,000 mg by mouth 2 (two) times daily as needed for moderate pain or headache.   amLODipine 5 MG tablet Commonly known as:  NORVASC Take 5 mg by mouth daily.   aspirin 81 MG tablet Take 81 mg by mouth daily.   atenolol 25 MG tablet Commonly known as:  TENORMIN Take 1 tablet (25 mg total) by mouth daily.   feeding supplement (PRO-STAT SUGAR FREE 64) Liqd Take 30 mLs by mouth 2 (two) times daily.   gabapentin 100 MG capsule Commonly known as:  NEURONTIN Take 2 capsules (200 mg total) by mouth 2 (two) times daily.   magnesium oxide 400 (241.3 Mg) MG tablet Commonly known as:  MAG-OX Take 1 tablet (400 mg total) by mouth 2 (two) times daily.  pantoprazole 40 MG tablet Commonly known as:  PROTONIX Take 1 tablet (40 mg total) by mouth daily at 12 noon.   potassium chloride SA 20 MEQ tablet Commonly known as:  K-DUR,KLOR-CON Take 2 tablets (40 mEq total) by mouth daily.   PRESCRIPTION MEDICATION Apply 1 application topically 4 (four) times daily as needed (pain). Dic 3%, Bac 2%, lid - pril 2-5%, Menth 1% cream   traMADol 50 MG tablet Commonly known as:  ULTRAM Take 1 tablet (50 mg  total) by mouth every 6 (six) hours as needed for up to 7 days for moderate pain.   trolamine salicylate 10 % cream Commonly known as:  ASPERCREME Apply 1 application topically as needed for muscle pain.   Vitamin D 2000 units tablet Take 2,000 Units by mouth daily.      Follow-up Information    Stark Klein, MD.   Specialty:  General Surgery Contact information: [address] [place] [address] [phone]           Signed: Stark Klein 01/13/2018, 1:18 PM

## 2018-01-13 NOTE — Progress Notes (Signed)
Physical Therapy Treatment Patient Details Name: Lauren Richard MRN: [id number] DOB: Sep 30, 1936 Today's Date: 01/13/2018    History of Present Illness pt is an 82 y/o female who presents for follow up for pancreatic CA. On 3/28 pt underwent laproscopic resection of the tumor, stenting of the pancreatic duct, Whipple procedure  PMH includes, HTN, arthritis,    PT Comments    Pt very pleasant and moving well. Pt continues to report sore abdomen limiting function and mobility with increased gait tolerance today but not yet ready to tackle flight of stairs. Pt educated for transfers, HEP and progression. SNF remains appropriate with pt living alone on 2nd floor.  Encouraged daily ambulation with nursing.     Follow Up Recommendations  SNF     Equipment Recommendations  None recommended by PT    Recommendations for Other Services       Precautions / Restrictions Precautions Precautions: Fall    Mobility  Bed Mobility Overal bed mobility: Modified Independent             General bed mobility comments: increased time with use of rail, cues for rolling fully to side to sit but pt pulling up into sitting with rail instead  Transfers Overall transfer level: Needs assistance   Transfers: Sit to/from Stand Sit to Stand: Min guard         General transfer comment: cues for hand placement from bed and toilet  Ambulation/Gait Ambulation/Gait assistance: Min guard Ambulation Distance (Feet): 300 Feet Assistive device: Rolling walker (2 wheeled) Gait Pattern/deviations: Step-through pattern;Decreased stride length;Trunk flexed   Gait velocity interpretation: Below normal speed for age/gender General Gait Details: cues to step into RW and extend trunk with gait, increased activity tolerance   Stairs            Wheelchair Mobility    Modified Rankin (Stroke Patients Only)       Balance Overall balance assessment: Needs assistance   Sitting balance-Leahy  Scale: Good       Standing balance-Leahy Scale: Fair                              Cognition Arousal/Alertness: Awake/alert Behavior During Therapy: WFL for tasks assessed/performed Overall Cognitive Status: Within Functional Limits for tasks assessed                                        Exercises General Exercises - Lower Extremity Long Arc Quad: AROM;20 reps;Seated;Both Hip Flexion/Marching: AROM;10 reps;Seated;Both    General Comments        Pertinent Vitals/Pain Pain Score: 2  Pain Location: abdomen Pain Descriptors / Indicators: Sore Pain Intervention(s): Limited activity within patient's tolerance;Repositioned    Home Living                      Prior Function            PT Goals (current goals can now be found in the care plan section) Progress towards PT goals: Progressing toward goals    Frequency           PT Plan Current plan remains appropriate    Co-evaluation              AM-PAC PT "6 Clicks" Daily Activity  Outcome Measure  Difficulty turning over in bed (including adjusting bedclothes, sheets and  blankets)?: A Lot Difficulty moving from lying on back to sitting on the side of the bed? : A Lot Difficulty sitting down on and standing up from a chair with arms (e.g., wheelchair, bedside commode, etc,.)?: A Little Help needed moving to and from a bed to chair (including a wheelchair)?: A Little Help needed walking in hospital room?: A Little Help needed climbing 3-5 steps with a railing? : A Little 6 Click Score: 16    End of Session   Activity Tolerance: Patient tolerated treatment well Patient left: in chair;with call bell/phone within reach Nurse Communication: Mobility status PT Visit Diagnosis: Other abnormalities of gait and mobility (R26.89);Muscle weakness (generalized) (M62.81)     Time: 3338-3291 PT Time Calculation (min) (ACUTE ONLY): 23 min  Charges:  $Gait Training: 8-22  mins $Therapeutic Exercise: 8-22 mins                    G Codes:       Elwyn Reach, PT [phone]    [place] 01/13/2018, 11:41 AM

## 2018-01-13 NOTE — Social Work (Signed)
Pt likely to discharge tomorrow, authorization with [employer] is still good. [place] made aware of discharge, will f/u with this CSW today if possible. Will leave a hand off for weekend CSW if pt discharges tomorrow.   CSW continuing to follow.   Alexander Mt, [place] Work [phone]

## 2018-01-13 NOTE — Care Management Important Message (Signed)
Important Message  Patient Details  Name: Lauren Richard MRN: [id number] Date of Birth: 1936/03/12   Medicare Important Message Given:  Yes    Darlette Dubow 01/13/2018, 1:21 PM

## 2018-01-14 DIAGNOSIS — N189 Chronic kidney disease, unspecified: Secondary | ICD-10-CM | POA: Diagnosis not present

## 2018-01-14 DIAGNOSIS — Z48815 Encounter for surgical aftercare following surgery on the digestive system: Secondary | ICD-10-CM | POA: Diagnosis not present

## 2018-01-14 DIAGNOSIS — C250 Malignant neoplasm of head of pancreas: Secondary | ICD-10-CM | POA: Diagnosis not present

## 2018-01-14 DIAGNOSIS — D649 Anemia, unspecified: Secondary | ICD-10-CM | POA: Diagnosis not present

## 2018-01-14 DIAGNOSIS — K5900 Constipation, unspecified: Secondary | ICD-10-CM | POA: Diagnosis not present

## 2018-01-14 DIAGNOSIS — E876 Hypokalemia: Secondary | ICD-10-CM | POA: Diagnosis not present

## 2018-01-14 DIAGNOSIS — I10 Essential (primary) hypertension: Secondary | ICD-10-CM | POA: Diagnosis not present

## 2018-01-14 DIAGNOSIS — R278 Other lack of coordination: Secondary | ICD-10-CM | POA: Diagnosis not present

## 2018-01-14 DIAGNOSIS — Z9889 Other specified postprocedural states: Secondary | ICD-10-CM | POA: Diagnosis not present

## 2018-01-14 DIAGNOSIS — C801 Malignant (primary) neoplasm, unspecified: Secondary | ICD-10-CM | POA: Diagnosis not present

## 2018-01-14 DIAGNOSIS — R2681 Unsteadiness on feet: Secondary | ICD-10-CM | POA: Diagnosis not present

## 2018-01-14 DIAGNOSIS — B252 Cytomegaloviral pancreatitis: Secondary | ICD-10-CM | POA: Diagnosis not present

## 2018-01-14 DIAGNOSIS — M6281 Muscle weakness (generalized): Secondary | ICD-10-CM | POA: Diagnosis not present

## 2018-01-14 DIAGNOSIS — R41841 Cognitive communication deficit: Secondary | ICD-10-CM | POA: Diagnosis not present

## 2018-01-14 DIAGNOSIS — R2689 Other abnormalities of gait and mobility: Secondary | ICD-10-CM | POA: Diagnosis not present

## 2018-01-14 DIAGNOSIS — C259 Malignant neoplasm of pancreas, unspecified: Secondary | ICD-10-CM | POA: Diagnosis not present

## 2018-01-14 LAB — TYPE AND SCREEN
ABO/RH(D): A POS
Antibody Screen: NEGATIVE
UNIT DIVISION: 0
UNIT DIVISION: 0
UNIT DIVISION: 0
Unit division: 0

## 2018-01-14 LAB — BPAM RBC
BLOOD PRODUCT EXPIRATION DATE: 201904222359
Blood Product Expiration Date: 201904222359
Blood Product Expiration Date: 201904222359
Blood Product Expiration Date: 201904222359
ISSUE DATE / TIME: 201904022038
ISSUE DATE / TIME: 201904031133
UNIT TYPE AND RH: 6200
UNIT TYPE AND RH: 6200
UNIT TYPE AND RH: 6200
Unit Type and Rh: 6200

## 2018-01-14 NOTE — Progress Notes (Signed)
[place] answered the phone and transferred me toToya. She told me that patient will be going to "[facility]". Was transferred to [facility] and after about 15 rings that call was dropped. Called back. Spoke with Nurse, Chinwe, and gave report. Patient IV has been removed. Patient helped getting dressed.  Patient is in the chair waiting for transport.

## 2018-01-14 NOTE — Discharge Summary (Signed)
Patient ID: Lauren Richard MRN: [id number] DOB/AGE: 1936/04/20 82 y.o.  Admit date: 01/05/2018 Discharge date: 01/14/2018  Admission Diagnoses:     Patient Active Problem List   Diagnosis Date Noted  . S/P laparoscopy 01/05/2018  . Adenocarcinoma of head of pancreas ([place]) 11/24/2017  . Acute kidney injury superimposed on chronic kidney disease ([place])   . Hypokalemia   . Anemia   . Biliary obstruction   . Acute kidney injury ([place]) 11/03/2017  . Pancreatic mass 11/03/2017  . Osteoarthritis of left knee 04/26/2017  . S/P total knee replacement using cement, left 04/26/2017  . Essential hypertension 03/03/2017  . Hyperlipidemia 03/03/2017  . Osteoarthritis 03/03/2017  . Obesity 03/03/2017  . Status post left foot surgery 12/03/2015  . Hammertoe 11/03/2015    Discharge Diagnoses:  Active Problems:   Adenocarcinoma of head of pancreas [facility]) S/p whipple (classic pancreaticoduodenectomy) HTN UTI, hospital acquired, treated. Urinary retention, resolved. Moderate protein calorie malnutrition    Discharged Condition: stable  Hospital Course:  Pt was admitted to the ICU following diagnostic laparoscopy and classic pancreaticoduodenectomy.  She had an epidural for pain control and low dose fentanyl PCA.  Her NGT was able to be pulled on POD 2.5.  She had her foley pulled initially while epidural was still in and this had to be reinserted. She developed a fever, but UA was positive.  Once this was treated, she defervesced.  Culture did not grow anything. Her catheter was pulled after epidural was removed and she did not have any issues.  She had an initial setback when she went from clears to fulls the first time, but n/v resolved.  Her PCA was d/c'd.  She was OOB and worked Richard with PT.  She had some initial electrolyte abnormalities that resolved.  Her drains were removed without issue.  She had a central line removed without incident.    Consults: anesthesia for  epidural  Significant Diagnostic Studies: labs: prior to d/c.  Gluc 127, CBC with HCT 30.9, prealbumin 11.6.  K 3.4  Treatments: antibiotics: ceftriaxone and surgery: see above.    Discharge Exam: Vitals:   01/14/18 0832 01/14/18 0911  BP:  131/61  Pulse:  87  Resp:    Temp: 98.8 F (37.1 C)   SpO2:     General appearance: alert, cooperative and no distress Resp: breathing normally Cardio: regular rate and rhythm GI: soft, non tender, non distended.  midline incision c/d/i without erythema or drainage >> staples removed and steri strips applied Extremities: calves soft and nontender  Disposition:   Allergies as of 01/13/2018   No Known Allergies        Medication List    TAKE these medications   acetaminophen 500 MG tablet Commonly known as:  TYLENOL Take 500-1,000 mg by mouth 2 (two) times daily as needed for moderate pain or headache.   amLODipine 5 MG tablet Commonly known as:  NORVASC Take 5 mg by mouth daily.   aspirin 81 MG tablet Take 81 mg by mouth daily.   atenolol 25 MG tablet Commonly known as:  TENORMIN Take 1 tablet (25 mg total) by mouth daily.   feeding supplement (PRO-STAT SUGAR FREE 64) Liqd Take 30 mLs by mouth 2 (two) times daily.   gabapentin 100 MG capsule Commonly known as:  NEURONTIN Take 2 capsules (200 mg total) by mouth 2 (two) times daily.   magnesium oxide 400 (241.3 Mg) MG tablet Commonly known as:  MAG-OX Take 1 tablet (400 mg total)  by mouth 2 (two) times daily.   pantoprazole 40 MG tablet Commonly known as:  PROTONIX Take 1 tablet (40 mg total) by mouth daily at 12 noon.   potassium chloride SA 20 MEQ tablet Commonly known as:  K-DUR,KLOR-CON Take 2 tablets (40 mEq total) by mouth daily.   PRESCRIPTION MEDICATION Apply 1 application topically 4 (four) times daily as needed (pain). Dic 3%, Bac 2%, lid - pril 2-5%, Menth 1% cream   traMADol 50 MG tablet Commonly known as:  ULTRAM Take 1 tablet (50 mg  total) by mouth every 6 (six) hours as needed for up to 7 days for moderate pain.   trolamine salicylate 10 % cream Commonly known as:  ASPERCREME Apply 1 application topically as needed for muscle pain.   Vitamin D 2000 units tablet Take 2,000 Units by mouth daily.       Contact information for follow-up providers    Stark Klein, MD.   Specialty:  General Surgery Contact information: [place] [address] [phone]            Contact information for after-discharge care    Destination    [facility] .   Service:  Skilled Nursing Contact information: [place], [facility] 01/14/2018, 10:22 AM Pager: [phone] Consults: [phone] Mon-Fri 7:00 am-4:30 pm Sat-Sun 7:00 am-11:30 am

## 2018-01-14 NOTE — Progress Notes (Signed)
Telemetry called Nurse to inform that patient is having frequent PACs. Texted Dr. Barry Dienes to ask if there is a concern since patient is set to d/c back to SNF on today (01/14/2018).

## 2018-01-14 NOTE — Progress Notes (Signed)
Ramona from [facility] called about a missing discharge summary. Called CSW @ [phone] for clarification.

## 2018-01-14 NOTE — Progress Notes (Signed)
CSW received call from [facility] asking that [place] set transportation up for 4pm to the facility. CSW to set up transportation for 4pm. RN to call report to [phone]. There are no further CSW needs at this time. CSW will sign off.   Virgie Dad. Torrin Frein, MSW, [facility] Clinical Social Worker [phone]

## 2018-01-14 NOTE — Progress Notes (Addendum)
10:37am- CSW has received and sent over updated discharge summary to [facility]. CSW still waiting for call back from [facility] to get a time to send pt over. CSW will continue to follow for needs.   8:43am- CSW spoke with Estill Bamberg with the on call service and was informed that on call MD would call CSW back in about 20 mins to see what further needs are. CSW will continue to follow.   CSW spoke with Guinea from [facility] and was informed that she is expecting discharges at [place] [facility] today and would be in contact with CSW regarding a time to send pt. CSW to receive call from Rafael Hernandez when pt has a bed. CSW will continue to follow for further needs.     Virgie Dad Elon Eoff, MSW, [facility] Clinical Social Worker [phone]

## 2018-01-16 DIAGNOSIS — N189 Chronic kidney disease, unspecified: Secondary | ICD-10-CM | POA: Diagnosis not present

## 2018-01-16 DIAGNOSIS — Z9889 Other specified postprocedural states: Secondary | ICD-10-CM | POA: Diagnosis not present

## 2018-01-16 DIAGNOSIS — I10 Essential (primary) hypertension: Secondary | ICD-10-CM | POA: Diagnosis not present

## 2018-01-16 DIAGNOSIS — C259 Malignant neoplasm of pancreas, unspecified: Secondary | ICD-10-CM | POA: Diagnosis not present

## 2018-01-19 ENCOUNTER — Ambulatory Visit: Payer: PPO | Admitting: Hematology

## 2018-01-20 DIAGNOSIS — Z9889 Other specified postprocedural states: Secondary | ICD-10-CM | POA: Diagnosis not present

## 2018-01-20 DIAGNOSIS — C259 Malignant neoplasm of pancreas, unspecified: Secondary | ICD-10-CM | POA: Diagnosis not present

## 2018-01-20 DIAGNOSIS — I10 Essential (primary) hypertension: Secondary | ICD-10-CM | POA: Diagnosis not present

## 2018-01-20 DIAGNOSIS — K5900 Constipation, unspecified: Secondary | ICD-10-CM | POA: Diagnosis not present

## 2018-01-23 ENCOUNTER — Telehealth: Payer: Self-pay | Admitting: Hematology

## 2018-01-23 NOTE — Telephone Encounter (Signed)
Appointment scheduled letter/calendar mailed to patient per 4/13 sch msg

## 2018-01-26 DIAGNOSIS — Z9889 Other specified postprocedural states: Secondary | ICD-10-CM | POA: Diagnosis not present

## 2018-01-26 DIAGNOSIS — E876 Hypokalemia: Secondary | ICD-10-CM | POA: Diagnosis not present

## 2018-01-26 DIAGNOSIS — D649 Anemia, unspecified: Secondary | ICD-10-CM | POA: Diagnosis not present

## 2018-01-26 DIAGNOSIS — C259 Malignant neoplasm of pancreas, unspecified: Secondary | ICD-10-CM | POA: Diagnosis not present

## 2018-01-31 DIAGNOSIS — M2040 Other hammer toe(s) (acquired), unspecified foot: Secondary | ICD-10-CM | POA: Diagnosis not present

## 2018-01-31 DIAGNOSIS — Z483 Aftercare following surgery for neoplasm: Secondary | ICD-10-CM | POA: Diagnosis not present

## 2018-01-31 DIAGNOSIS — I129 Hypertensive chronic kidney disease with stage 1 through stage 4 chronic kidney disease, or unspecified chronic kidney disease: Secondary | ICD-10-CM | POA: Diagnosis not present

## 2018-01-31 DIAGNOSIS — Z9049 Acquired absence of other specified parts of digestive tract: Secondary | ICD-10-CM | POA: Diagnosis not present

## 2018-01-31 DIAGNOSIS — Z7982 Long term (current) use of aspirin: Secondary | ICD-10-CM | POA: Diagnosis not present

## 2018-01-31 DIAGNOSIS — E440 Moderate protein-calorie malnutrition: Secondary | ICD-10-CM | POA: Diagnosis not present

## 2018-01-31 DIAGNOSIS — C250 Malignant neoplasm of head of pancreas: Secondary | ICD-10-CM | POA: Diagnosis not present

## 2018-01-31 DIAGNOSIS — E669 Obesity, unspecified: Secondary | ICD-10-CM | POA: Diagnosis not present

## 2018-01-31 DIAGNOSIS — R339 Retention of urine, unspecified: Secondary | ICD-10-CM | POA: Diagnosis not present

## 2018-01-31 DIAGNOSIS — N189 Chronic kidney disease, unspecified: Secondary | ICD-10-CM | POA: Diagnosis not present

## 2018-01-31 DIAGNOSIS — Z6831 Body mass index (BMI) 31.0-31.9, adult: Secondary | ICD-10-CM | POA: Diagnosis not present

## 2018-01-31 DIAGNOSIS — D630 Anemia in neoplastic disease: Secondary | ICD-10-CM | POA: Diagnosis not present

## 2018-01-31 DIAGNOSIS — D631 Anemia in chronic kidney disease: Secondary | ICD-10-CM | POA: Diagnosis not present

## 2018-02-02 ENCOUNTER — Other Ambulatory Visit: Payer: Self-pay | Admitting: *Deleted

## 2018-02-02 ENCOUNTER — Encounter: Payer: Self-pay | Admitting: *Deleted

## 2018-02-02 NOTE — Patient Outreach (Signed)
[facility]) Care Management  02/02/2018  Lauren Richard Dec 28, 1935 [id number]    Referral received 4/24 that pt discharged via [facility] on 4/19  Initial outreach today however only able to leave a HIPAA approved voice message requesting a call back. Will further assess possible needs for [facility] services at that time.  Raina Mina, RN Care Management Coordinator [place] Office [phone]

## 2018-02-06 ENCOUNTER — Other Ambulatory Visit: Payer: PPO | Admitting: *Deleted

## 2018-02-06 NOTE — Patient Outreach (Signed)
[facility]) Care Management  02/06/2018  GINNY LOOMER 1936-04-06 [id number]    Transition of care (2nd outreach unsuccessful)  RN 2nd attempt to reach pt however unsuccessful but was able to leave a HIPAA approved voice message requesting a call back. Will further engage at that time on pt's possible needs. Will schedule another outreach call prior to closure of this case. Note outreach letter has been sent to this pt.  Raina Mina, RN Care Management Coordinator [place] Office [phone]

## 2018-02-06 NOTE — Progress Notes (Signed)
  Oncology Nurse Navigator Documentation  Navigator Location: [facility] (02/06/18 1052)   )Navigator Encounter Type: Telephone (02/06/18 1052) Telephone: Lahoma Crocker Call;Appt Confirmation/Clarification (02/06/18 1052)     Surgery Date: 01/05/18 (02/06/18 1052)           Treatment Initiated Date: 01/05/18 (02/06/18 1052)         Interventions: Coordination of Care (02/06/18 1052) Dr. Barry Dienes requested that patient be scheduled for nutrition consult. I called patient's niece to coordinate time and date of appointment so that patient will have transporatation. Patient is scheduled for 02/15/18 @ 10:30.             Acuity: Level 2 (02/06/18 1052)         Time Spent with Patient: 15 (02/06/18 1052)

## 2018-02-07 ENCOUNTER — Telehealth: Payer: Self-pay | Admitting: Genetics

## 2018-02-07 DIAGNOSIS — D630 Anemia in neoplastic disease: Secondary | ICD-10-CM | POA: Diagnosis not present

## 2018-02-07 DIAGNOSIS — R339 Retention of urine, unspecified: Secondary | ICD-10-CM | POA: Diagnosis not present

## 2018-02-07 DIAGNOSIS — Z7982 Long term (current) use of aspirin: Secondary | ICD-10-CM | POA: Diagnosis not present

## 2018-02-07 DIAGNOSIS — Z483 Aftercare following surgery for neoplasm: Secondary | ICD-10-CM | POA: Diagnosis not present

## 2018-02-07 DIAGNOSIS — I129 Hypertensive chronic kidney disease with stage 1 through stage 4 chronic kidney disease, or unspecified chronic kidney disease: Secondary | ICD-10-CM | POA: Diagnosis not present

## 2018-02-07 DIAGNOSIS — C250 Malignant neoplasm of head of pancreas: Secondary | ICD-10-CM | POA: Diagnosis not present

## 2018-02-07 DIAGNOSIS — E669 Obesity, unspecified: Secondary | ICD-10-CM | POA: Diagnosis not present

## 2018-02-07 DIAGNOSIS — Z9049 Acquired absence of other specified parts of digestive tract: Secondary | ICD-10-CM | POA: Diagnosis not present

## 2018-02-07 DIAGNOSIS — N189 Chronic kidney disease, unspecified: Secondary | ICD-10-CM | POA: Diagnosis not present

## 2018-02-07 DIAGNOSIS — D631 Anemia in chronic kidney disease: Secondary | ICD-10-CM | POA: Diagnosis not present

## 2018-02-07 DIAGNOSIS — M2040 Other hammer toe(s) (acquired), unspecified foot: Secondary | ICD-10-CM | POA: Diagnosis not present

## 2018-02-07 DIAGNOSIS — E440 Moderate protein-calorie malnutrition: Secondary | ICD-10-CM | POA: Diagnosis not present

## 2018-02-07 DIAGNOSIS — Z6831 Body mass index (BMI) 31.0-31.9, adult: Secondary | ICD-10-CM | POA: Diagnosis not present

## 2018-02-07 NOTE — Telephone Encounter (Signed)
Talked to the patient more about genetic testing.  When schedulers called previously to schedule this appointment she indicated she already had testing.  I called to inform her we did not have any records of her getting genetic testing here at [facility],.  I briefly explained genetic counseling/genetic testing (can be informative for family members, in some cases can impact treatment options) and asked if she wanted to schedule an appointment for her to get testing.   She and would like to talk about this to Dr. Burr Medico first before making an appointment.     I left her my contact information if she had further questions and/or wanted to schedule genetic counseling/testing in the future.

## 2018-02-08 NOTE — Progress Notes (Signed)
[place]  [phone] [phone]  Clinic Follow Up Note   Patient Care Team: Glendale Chard, MD as PCP - General (Internal Medicine) Tobi Bastos, RN as [facility] Management 02/09/2018  CHIEF COMPLAINTS:  Follow up pancreas cancer   SUMMARY OF ONCOLOGY HISTORY Cancer Staging Adenocarcinoma of head of pancreas [facility]) Staging form: Exocrine Pancreas, AJCC 8th Edition - Clinical: Stage IB (cT2, cN0, cM0) - Unsigned  Oncology History   Cancer Staging Adenocarcinoma of head of pancreas ([place]) Staging form: Exocrine Pancreas, AJCC 8th Edition - Clinical: Stage IB (cT2, cN0, cM0) - Unsigned - Pathologic stage from 01/05/2018: Stage III (pT3, pN2, cM0) - Signed by Truitt Merle, MD on 02/10/2018       Adenocarcinoma of head of pancreas ([place])   11/03/2017 Imaging    CT AP WO CONTRAST IMPRESSION: 1. Findings are concerning for common bile duct obstruction, potentially related to a primary pancreatic neoplasm, as detailed above. At this time this is associated with moderate to severe intra and extrahepatic biliary ductal dilatation. Further evaluation with MRI of the abdomen with and without IV gadolinium with MRCP should be considered in the near future to better evaluate these findings. Alternatively, if the patient is not likely to be able to adequately hold her breath during an MRI examination, pancreatic protocol CT scan would be recommended. 2. Biliary sludge in the gallbladder. No findings to suggest an acute cholecystitis at this time. 3. Small right adrenal adenoma incidentally noted. 4. Aortic atherosclerosis, in addition to 3 vessel coronary artery disease. Assessment for potential risk factor modification, dietary therapy or pharmacologic therapy may be warranted, if clinically indicated.  Aortic Atherosclerosis (ICD10-I70.0).       11/04/2017 Procedure    ERCP per Dr. Benson Norway - The major papilla appeared  normal. - A segmental biliary stricture was found. The stricture was malignant appearing. - The entire biliary tree was severely dilated, with a mass causing an obstruction. - A biliary sphincterotomy was performed. - One covered metal stent was placed into the common bile duct.      11/04/2017 Imaging    FINDINGS: Imaging demonstrates cannulation of the common bile duct. The common bile duct is dilated. The inferior common bile duct is narrowed. Final image demonstrates placement of a metallic stent across the area of narrowing into the duodenum.  IMPRESSION: Common bile duct stent placement.      11/11/2017 Procedure    EUS per Dr. Benson Norway Endosonographic Finding Findings: A round mass was identified in the pancreatic head. The mass was hypoechoic. The mass measured 30 mm by 22 mm in maximal cross-sectional diameter. The outer margins were irregular. Fine needle aspiration for cytology was performed. Color Doppler imaging was utilized prior to needle puncture to confirm a lack of significant vascular structures within the needle path. Five passes were made with the 25 gauge needle using a transduodenal approach. A stylet was used. A cytotechnologist was present to evaluate the adequacy of the specimen. The cellularity of the specimen was adequate. Final cytology results are pending. The procedure was technically difficult as the metallic stent obscured views of the lesion. It was also very difficult to obtain adequate views for FNA, however, adequate samples were obtained.      11/11/2017 Pathology Results    Diagnosis FINE NEEDLE ASPIRATION, ENDOSCOPIC, PANCREAS HEAD(SPECIMEN 1 OF 1 COLLECTED 11/11/17): ADENOCARCINOMA. Preliminary Diagnosis Intraoperative Diagnosis: SPINDLE CELL FRAGMENTS AND RARE ATYPICAL EPITHELIAL CELLS.(JDP)      11/24/2017 Initial Diagnosis  Adenocarcinoma of head of pancreas ([place])      12/01/2017 Imaging    CT Abdomen W Contrast 12/01/17 IMPRESSION: 1.  21 x 20 mm pancreatic head/uncinate process lesion without obvious involvement/invasion of the major surrounding vascular structures. 2. Common bile duct stent in place with associated pneumobilia but decompression of the biliary tree. 3. Borderline enlarged celiac axis and periportal lymph nodes. 4. No findings to suggest hepatic metastatic disease. 5. Advanced aortic and branch vessel calcifications but no aneurysm or dissection. 6. Patchy right basilar atelectasis.      01/05/2018 Surgery    LAPAROSCOPY DIAGNOSTIC ERAS PATHWAY and WHIPPLE PROCEDURE by Dr. Barry Dienes and Dr. Johney Maine 01/05/18      01/05/2018 Pathology Results    Diagnosis 01/05/18 1. Gallbladder, and lymph node - CHRONIC CHOLECYSTITIS. - ONE BENIGN PERICYSTIC LYMPH NODE (0/1). - THERE IS NO EVIDENCE OF MALIGNANCY. 2. Lymph node, biopsy, Common Hepatic Artery - THERE IS NO EVIDENCE OF CARCINOMA IN 1 OF 1 LYMPH NODE (0/1). 3. Whipple procedure/resection - INVASIVE ADENOCARCINOMA, MODERATELY DIFFERENTIATED, SPANNING 4.0 CM. - PERINEURAL INVASION IS IDENTIFIED. - LYMPHOVASCULAR INVASION IS IDENTIFIED. - ADENOCARCINOMA IS PRESENT AT THE UNCINATE (RED) MARGIN. - ADENOCARCINOMA EXTENDS INTO PERIPANCREATIC SOFT TISSUE. - METASTATIC CARCINOMA IN 5 OF 29 LYMPH NODES (5/29), WITH EXTRACAPSULAR EXTENSION. - SEE ONCOLOGY TABLE BELOW. 4. Lymph node, biopsy, Portal - THERE IS NO EVIDENCE OF CARCINOMA IN 1 OF 1 LYMPH NODE (0/1). 5. Lymph node, biopsy, Superior mesenteric artery - THERE IS NO EVIDENCE OF CARCINOMA IN 1 OF 1 LYMPH NODE (0/1). 6. Lymph node, biopsy, Ligament of Treitz - THERE IS NO EVIDENCE OF CARCINOMA IN 1 OF 1 LYMPH NODE (0/1)      01/05/2018 - 01/14/2018 Hospital Admission    Admit date: 01/05/18 Admission diagnosis: Fever from infection Additional comments: According to Discharge summary Pt was admitted to the ICU following and she had an epidural for pain control and low dose fentanyl PCA. Her NGT was able to be  pulled on POD 2.5.  She had her foley pulled initially while epidural was still in and this had to be reinserted. She developed a fever, but UA was positive.  Once this was treated, she defervesced.  Culture did not grow anything. Her catheter was pulled after epidural was removed and she did not have any issues.  She had an initial setback when she went from clears to fulls the first time, but n/v resolved.  Her PCA was d/c'd.  She was OOB and worked well with PT.  She had some initial electrolyte abnormalities that resolved.  Her drains were removed without issue.  She had a central line removed without incident.         01/05/2018 Pathology Results    Gallbladder, and lymph node with chronic cholecystitis. One benign pericystic lymph node (0/1). There is no evidence of malignancy. Lymph node, biopsy, Common Hepatic Artery with no evidence of carcinoma in 1 of 1 lymph nodes (0/1). Whipple procedure/resection with invasive adenocarcinoma, moderately differentiated, spanning 4.0 cm. Perineural invasion is identified. Lymphovascular invasion is identified. Adenocarcinoma is present at the uncinate (red) margin. Adenocarcinoma extends into peripancreatic soft tissue. metastatic carcinoma in 5 of 29 lymph nodes (5/29), with extracapsular extension. Lymph node, biopsy, Portal with no evidence of carcinoma in 1 of 1 lymph node (0/1). Lymph node, biopsy, Superior mesenteric artery with no evidence of carcinoma in 1 of 1 lymph node (0/1). Lymph node, biopsy, Ligament of Treitz with no evidence of carcinoma in 1 of  1 lymph node (0/1).        01/05/2018 Cancer Staging    Staging form: Exocrine Pancreas, AJCC 8th Edition - Pathologic stage from 01/05/2018: Stage III (pT3, pN2, cM0) - Signed by Truitt Merle, MD on 02/10/2018      HISTORY OF PRESENTING ILLNESS:  Lauren Richard 82 y.o. female is here because of newly diagnosed pancreas cancer. She presented to ED on 10/18/17 complaining of weakness and chest discomfort,  she reported a 10-day history of anorexia with intermittent nausea and vomiting.  She was evaluated and found to be in A. Fib with RVR, EKG without ischemia.  CTA negative for PE.  Symptoms improved and she was discharged home on Xarelto and CCB with follow-up at A. fib clinic on 10/24/2017; found to have abnormal TFTs on workup.  She was found to not have A. fib, Xarelto was stopped.  She presented to her PCP for regularly scheduled follow-up on 11/03/2017; with complaints of decreased appetite, 5-10 lbs weight loss, fatigue, and intermittent dizziness.  Had syncopal event there and found to have painless jaundice, was directly sent to ED for further workup, LFTs markedly elevated, T bili in the ED was elevated to 19.7.   During hospitalization she had AKI; had noncontrast CT on 11/03/2017 with findings of a mass like area measuring 2.1 x 2.3 x 3.1 cm in the head of the pancreas immediately below the level of the common bile duct truncation as well as findings concerning for common bile duct obstruction with moderate to severe intra-and extrahepatic biliary ductal dilatation.  No lymphadenopathy noted in the abdomen or pelvis.  ERCP showed the entire biliary tree to be severely dilated and diffusely dilated with a mass causing the obstruction. Biliary sphincterotomy was performed and a metal stent was successfully placed into the common bile duct.  She improved clinically and LFTs improved, she was discharged home to follow-up with Dr. Benson Norway outpatient with EUS with FNA.  Biopsy confirmed adenocarcinoma of the pancreatic head.  Past medical history is significant for hyperlipidemia, hypertension, arthritis.  Mild anemia found in July 2018, Hgb 10.2 at that time, 9.6 during recent hospitalization.  She has history of oral iron supplementation and received a blood transfusion with hysterectomy in the past. Endocrinology consult is pending for hyperthyroidism due to altered TFTs; cardizem was stopped and she was  started on beta-blocker for tachycardia during hospitalization.  She has positive family history for renal cancer in her mother and lung cancer in her father, he was a smoker.  She has occasional wine socially, never smoked.  She is widowed, did not have children; lives alone, independent of all ADLs, she does not drive but has family close by who drive and support her.  Prior to her illness she exercised at the [facility] twice weekly; remains active in her church community.   Today she feels well, her appetite is returning.  She feels more like her usual self.  Denies pain, nausea.  BMs are regular.  She has mild back pain at her baseline, controlled with Tylenol as needed.  CURRENT THERAPY: Pending adjuvant chemotherapy  INTERVAL HISTORY:  Lauren Richard female presents to the office today for follow up. She is accompanied by a family member today. She reports that she is doing well overall. She notes that she has been doing well since her whipple procedure.   Of note since the patient last visit, she had a whipple procedure completed on 01/05/2018 with results revealing Gallbladder, and lymph node  with chronic cholecystitis. One benign pericystic lymph node (0/1). There is no evidence of malignancy. Lymph node, biopsy, Common Hepatic Artery with no evidence of carcinoma in 1 of 1 lymph nodes (0/1). Whipple procedure/resection with invasive adenocarcinoma, moderately differentiated, spanning 4.0 cm. Perineural invasion is identified. Lymphovascular invasion is identified. Adenocarcinoma is present at the uncinate (red) margin. Adenocarcinoma extends into peripancreatic soft tissue. metastatic carcinoma in 5 of 29 lymph nodes (5/29), with extracapsular extension. Lymph node, biopsy, Portal with no evidence of carcinoma in 1 of 1 lymph node (0/1). Lymph node, biopsy, Superior mesenteric artery with no evidence of carcinoma in 1 of 1 lymph node (0/1). Lymph node, biopsy, Ligament of Treitz with no evidence of  carcinoma in 1 of 1 lymph node (0/1).   On review of systems, pt reports lack of appetite, weight loss (16 lbs following surgery), decreased energy, small stools. She notes that she is able to do everything at home due to living at home. She is able to complete her daily activities without any issues. She has a walker, however, she only uses it when she walks for long distances. ** She is eating small 6 meals daily. She denies constipation and any other symptoms.    MEDICAL HISTORY:  Past Medical History:  Diagnosis Date  . Adenocarcinoma of head of pancreas ([place]) 11/24/2017  . Arthritis    "all over" (04/26/2017); knees, shoulder  . Essential hypertension 03/03/2017  . History of blood transfusion 1979   "when I had my hysterectomy"  . Hyperlipidemia   . Hypothyroidism   . Obesity 03/03/2017  . Osteoarthritis 03/03/2017  . Pneumonia    as a child    SURGICAL HISTORY: Past Surgical History:  Procedure Laterality Date  . BREAST CYST EXCISION Right    "cut 6 cysts out"  . CYST EXCISION Left    "off my toe"  . DILATION AND CURETTAGE OF UTERUS     "before hysterectomy"  . ERCP N/A 11/04/2017   Procedure: ENDOSCOPIC RETROGRADE CHOLANGIOPANCREATOGRAPHY (ERCP);  Surgeon: Carol Ada, MD;  Location: [place];  Service: Endoscopy;  Laterality: N/A;  . EUS N/A 11/11/2017   Procedure: UPPER ENDOSCOPIC ULTRASOUND (EUS) LINEAR;  Surgeon: Carol Ada, MD;  Location: [facility];  Service: Endoscopy;  Laterality: N/A;  . FINE NEEDLE ASPIRATION N/A 11/11/2017   Procedure: FINE NEEDLE ASPIRATION (FNA) LINEAR;  Surgeon: Carol Ada, MD;  Location: [facility];  Service: Endoscopy;  Laterality: N/A;  . INGUINAL HERNIA REPAIR Right   . JOINT REPLACEMENT    . LAPAROSCOPY N/A 01/05/2018   Procedure: LAPAROSCOPY DIAGNOSTIC ERAS PATHWAY;  Surgeon: Stark Klein, MD;  Location: [place];  Service: General;  Laterality: N/A;  [place]  . REDUCTION MAMMAPLASTY Bilateral 1980  . TONSILLECTOMY     "when  I small"  . TOTAL ABDOMINAL HYSTERECTOMY  1979  . TOTAL KNEE ARTHROPLASTY Right 04/26/2017  . TOTAL KNEE ARTHROPLASTY Left 04/26/2017   Procedure: LEFT TOTAL KNEE ARTHROPLASTY;  Surgeon: [name], MD;  Location: [place];  Service: Orthopedics;  Laterality: Left;  . WHIPPLE PROCEDURE N/A 01/05/2018   Procedure: WHIPPLE PROCEDURE;  Surgeon: Stark Klein, MD;  Location: [place];  Service: General;  Laterality: N/A;    SOCIAL HISTORY: Social History   Socioeconomic History  . Marital status: Widowed    Spouse name: Not on file  . Number of children: Not on file  . Years of education: Not on file  . Highest education level: Not on file  Occupational History  .  Not on file  Social Needs  . Financial resource strain: Not on file  . Food insecurity:    Worry: Not on file    Inability: Not on file  . Transportation needs:    Medical: Not on file    Non-medical: Not on file  Tobacco Use  . Smoking status: Never Smoker  . Smokeless tobacco: Never Used  Substance and Sexual Activity  . Alcohol use: No    Alcohol/week: 0.0 oz    Frequency: Never    Comment: occasional glass of wine   . Drug use: No  . Sexual activity: Never  Lifestyle  . Physical activity:    Days per week: Not on file    Minutes per session: Not on file  . Stress: Not on file  Relationships  . Social connections:    Talks on phone: Not on file    Gets together: Not on file    Attends religious service: Not on file    Active member of club or organization: Not on file    Attends meetings of clubs or organizations: Not on file    Relationship status: Not on file  . Intimate partner violence:    Fear of current or ex partner: Not on file    Emotionally abused: Not on file    Physically abused: Not on file    Forced sexual activity: Not on file  Other Topics Concern  . Not on file  Social History Narrative  . Not on file    FAMILY HISTORY: Family History  Problem Relation Age of Onset  . Kidney  cancer Mother 36  . Lung cancer Father 58       lung, smoker  . Congestive Heart Failure Sister   . Asthma Sister   . Atrial fibrillation Sister   . Hypertension Sister   . Diabetes Sister     ALLERGIES:  has No Known Allergies.  MEDICATIONS:  Current Outpatient Medications  Medication Sig Dispense Refill  . acetaminophen (TYLENOL) 500 MG tablet Take 500-1,000 mg by mouth 2 (two) times daily as needed for moderate pain or headache.     . Amino Acids-Protein Hydrolys (FEEDING SUPPLEMENT, PRO-STAT SUGAR FREE 64,) LIQD Take 30 mLs by mouth 2 (two) times daily. 900 mL 0  . amLODipine (NORVASC) 5 MG tablet Take 5 mg by mouth daily.     Marland Kitchen aspirin 81 MG tablet Take 81 mg by mouth daily.    Marland Kitchen atenolol (TENORMIN) 25 MG tablet Take 1 tablet (25 mg total) by mouth daily. 30 tablet 0  . Cholecalciferol (VITAMIN D) 2000 units tablet Take 2,000 Units by mouth daily.    Marland Kitchen gabapentin (NEURONTIN) 100 MG capsule Take 2 capsules (200 mg total) by mouth 2 (two) times daily. 30 capsule 0  . magnesium oxide (MAG-OX) 400 (241.3 Mg) MG tablet Take 1 tablet (400 mg total) by mouth 2 (two) times daily. 60 tablet 0  . pantoprazole (PROTONIX) 40 MG tablet Take 1 tablet (40 mg total) by mouth daily at 12 noon. 30 tablet 0  . potassium chloride SA (K-DUR,KLOR-CON) 20 MEQ tablet Take 2 tablets (40 mEq total) by mouth daily. 30 tablet 0  . PRESCRIPTION MEDICATION Apply 1 application topically 4 (four) times daily as needed (pain). Dic 3%, Bac 2%, lid - pril 2-5%, Menth 1% cream    . trolamine salicylate (ASPERCREME) 10 % cream Apply 1 application topically as needed for muscle pain.     No current facility-administered medications for  this visit.     REVIEW OF SYSTEMS:  Constitutional: Denies fevers, chills or abnormal night sweats (+) weight loss (+) fatigue, improving (+) low appetite, improving Eyes: Denies blurriness of vision, double vision or watery eyes Ears, nose, mouth, throat, and face: Denies mucositis  or sore throat Respiratory: Denies cough, dyspnea or wheezes Cardiovascular: Denies palpitation, chest discomfort or lower extremity swelling Gastrointestinal:  Denies constipation, diarrhea, heartburn or change in bowel habits (+) presented with nausea/vomiting, improved now Skin: Denies abnormal skin rashes (+) jaundice at presentation Lymphatics: Denies new lymphadenopathy or easy bruising Neurological:Denies numbness, tingling or new weaknesses (+) presented with dizziness, denies today Behavioral/Psych: Mood is stable, no new changes  All other systems were reviewed with the patient and are negative.  PHYSICAL EXAMINATION:  ECOG PERFORMANCE STATUS: 1 - Symptomatic but completely ambulatory  Vitals:   02/09/18 1300  BP: (!) 152/81  Pulse: 88  Resp: 18  Temp: 98.5 F (36.9 C)  SpO2: 98%   There were no vitals filed for this visit.  GENERAL:alert, no distress and comfortable SKIN: skin color, texture, turgor are normal, no rashes or significant lesions EYES: normal, conjunctiva are pink and non-injected, (+) mild scleral icterus OROPHARYNX:no exudate, no erythema and lips, buccal mucosa, and tongue normal  NECK: supple, thyroid normal size, non-tender, without nodularity LYMPH:  no palpable cervical, supraclavicular, axillary, or inguinal lymphadenopathy  LUNGS: clear to auscultation bilaterally with normal breathing effort HEART: regular rate & rhythm and no murmurs and no lower extremity edema ABDOMEN:abdomen soft, non-tender and normal bowel sounds.  No hepatomegaly. Midline incision from umbilical that is healed well with minimal scar tissues.  Musculoskeletal:no cyanosis of digits and no clubbing  PSYCH: alert & oriented x 3 with fluent speech NEURO: no focal motor/sensory deficits  LABORATORY DATA:  I have reviewed the data as listed CBC Latest Ref Rng & Units 01/13/2018 01/10/2018 01/09/2018  WBC 4.0 - 10.5 K/uL 9.0 7.9 8.7  Hemoglobin 12.0 - 15.0 g/dL 9.9(L) 9.2(L) 8.6(L)   Hematocrit 36.0 - 46.0 % 30.9(L) 29.3(L) 25.8(L)  Platelets 150 - 400 K/uL 304 230 164   CMP Latest Ref Rng & Units 01/13/2018 01/10/2018 01/09/2018  Glucose 65 - 99 mg/dL 127(H) 132(H) 118(H)  BUN 6 - 20 mg/dL <5(L) <5(L) 6  Creatinine 0.44 - 1.00 mg/dL 0.73 0.69 0.85  Sodium 135 - 145 mmol/L 139 136 136  Potassium 3.5 - 5.1 mmol/L 3.4(L) 3.9 4.0  Chloride 101 - 111 mmol/L 107 106 111  CO2 22 - 32 mmol/L 18(L) 20(L) 21(L)  Calcium 8.9 - 10.3 mg/dL 9.3 9.4 8.9  Total Protein 6.5 - 8.1 g/dL 6.7 6.6 6.0(L)  Total Bilirubin 0.3 - 1.2 mg/dL 0.5 0.6 0.6  Alkaline Phos 38 - 126 U/L 64 55 54  AST 15 - 41 U/L 16 14(L) 15  ALT 14 - 54 U/L 11(L) 12(L) 12(L)   PATHOLOGY Diagnosis 11/11/17 FINE NEEDLE ASPIRATION, ENDOSCOPIC, PANCREAS HEAD(SPECIMEN 1 OF 1 COLLECTED 11/11/17): ADENOCARCINOMA. Preliminary Diagnosis Intraoperative Diagnosis: SPINDLE CELL FRAGMENTS AND RARE ATYPICAL EPITHELIAL CELLS.(JDP)  Whipple Procedure, 01/05/2018 1. Gallbladder, and lymph node - CHRONIC CHOLECYSTITIS. - ONE BENIGN PERICYSTIC LYMPH NODE (0/1). - THERE IS NO EVIDENCE OF MALIGNANCY. 2. Lymph node, biopsy, Common Hepatic Artery - THERE IS NO EVIDENCE OF CARCINOMA IN 1 OF 1 LYMPH NODE (0/1). 3. Whipple procedure/resection - INVASIVE ADENOCARCINOMA, MODERATELY DIFFERENTIATED, SPANNING 4.0 CM. - PERINEURAL INVASION IS IDENTIFIED. - LYMPHOVASCULAR INVASION IS IDENTIFIED. - ADENOCARCINOMA IS PRESENT AT THE UNCINATE (RED) MARGIN. - ADENOCARCINOMA  EXTENDS INTO PERIPANCREATIC SOFT TISSUE. - METASTATIC CARCINOMA IN 5 OF 29 LYMPH NODES (5/29), WITH EXTRACAPSULAR EXTENSION. - SEE ONCOLOGY TABLE BELOW. 4. Lymph node, biopsy, Portal - THERE IS NO EVIDENCE OF CARCINOMA IN 1 OF 1 LYMPH NODE (0/1). 5. Lymph node, biopsy, Superior mesenteric artery - THERE IS NO EVIDENCE OF CARCINOMA IN 1 OF 1 LYMPH NODE (0/1). 6. Lymph node, biopsy, Ligament of Treitz - THERE IS NO EVIDENCE OF CARCINOMA IN 1 OF 1 LYMPH NODE  (0/1).  RADIOGRAPHIC STUDIES: I have personally reviewed the radiological images as listed and agreed with the findings in the report. No results found.  ASSESSMENT & PLAN: Debraann Livingstone is a pleasant 82 y.o. AAF who presented with syncopal event and obstructive juandice found to have malignant pancreatic head mass.   1. Adenocarcinoma of pancreatic head, pT3N2M0, stage III, G2, with positive margin  -previously reviewed her medical records, imaging, and pathology in detail with the patient and family. She has what appears to be localized adenocarcinoma on pancreatic head.  -She underwent Whipple surgery, and tolerated surgery well.  She has recovered well from surgery, except not gaining weight back. -I discussed the recent pathology results with the patient and her family member today (02/09/2018).  She has locally advanced disease with 5+ lymph nodes, and positive margin at the uncinate -I discussed the nature history of pancreatic cancer, and very high risk of recurrence, especially with the multiple positive nodes and positive margin.  Her case was discussed in our tumor board, and we recommend adjuvant chemotherapy and radiation if she agrees. -Although she has good performance status, and has recovered well from surgery, she is a 82 year old, lives alone, I do not think she is a candidate for intensive chemotherapy.  I recommend adjuvant chemotherapy starting in 1-2 weeks consisted of 6 months of gemcitabine with or without capecitabine, to reduce her risk of recurrence. -If she is able to tolerate gemcitacine well, then we will make a decision on whether to add PO xeloda from cycle 2.  I will likely give Xeloda with concurrent adjuvant radiation. -Due to her advanced age, will give gemcitabine 1000mg /m2 weekly, 2-3 weeks on and one-week off -If the patient isn't able to tolerate a full 6 months, we will stop chemotherapy after 3-4 months.  --Chemotherapy consent: Side effects including but  does not not limited to, fatigue, nausea, vomiting, diarrhea, hair loss, neuropathy, fluid retention, renal and kidney dysfunction, neutropenic fever, needed for blood transfusion, bleeding, were discussed with patient in great detail. She agrees to proceed. -The goal of therapy is curative -I will schedule the patient for a chemo class 1 week prior to the start of chemotherapy treatment. . -I recommend chemotherapy prior to the start of radiation therapy.  -Chemo class on 5/9, and move her dietician appointment from 5/8 to 5/9 -Lab, and chemo gemcitabine in 1, 2 and 3 weeks  -F/u in 2 weeks     2. Weight loss, decreased appetite, fatigue  -She has 13 lbs weight loss in 2 months, since symptom onset.  -Patient has lost 16 lbs since her surgery, I encouraged her to increase her nutrition supplement --Patient to follow up with nutritionist, on 02/15/2018.  3. HTN, hyperlipidemia -With PCP -We we will monitor her blood pressure closely during her chemotherapy, and adjust her medication if needed  4. SOCIAL SUPPORT  -Patient lives alone, her niece and nephew will bring her to the office visit and treatment, and support her at home. 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   All questions were answered. The patient knows to call the clinic with any problems, questions or concerns. I spent 30 minutes counseling the patient face to face. The total time spent in the appointment was 45 minutes and more than 50% was on counseling and review of test results.  This document serves as a record of services personally performed by Truitt Merle, MD. It was created on her behalf by Steva Colder, a trained medical scribe. The creation of this record is based on the scribe's personal observations and  the provider's statements to them.   I have reviewed the above documentation for accuracy and completeness, and I agree with the above.   Truitt Merle, MD 02/09/2018

## 2018-02-09 ENCOUNTER — Encounter: Payer: Self-pay | Admitting: Hematology

## 2018-02-09 ENCOUNTER — Inpatient Hospital Stay: Payer: PPO | Attending: Nurse Practitioner | Admitting: Hematology

## 2018-02-09 ENCOUNTER — Telehealth: Payer: Self-pay | Admitting: Hematology

## 2018-02-09 VITALS — BP 152/81 | HR 88 | Temp 98.5°F | Resp 18

## 2018-02-09 DIAGNOSIS — D649 Anemia, unspecified: Secondary | ICD-10-CM | POA: Insufficient documentation

## 2018-02-09 DIAGNOSIS — I700 Atherosclerosis of aorta: Secondary | ICD-10-CM | POA: Diagnosis not present

## 2018-02-09 DIAGNOSIS — C250 Malignant neoplasm of head of pancreas: Secondary | ICD-10-CM | POA: Insufficient documentation

## 2018-02-09 DIAGNOSIS — I10 Essential (primary) hypertension: Secondary | ICD-10-CM | POA: Diagnosis not present

## 2018-02-09 DIAGNOSIS — R5383 Other fatigue: Secondary | ICD-10-CM

## 2018-02-09 DIAGNOSIS — I4891 Unspecified atrial fibrillation: Secondary | ICD-10-CM | POA: Diagnosis not present

## 2018-02-09 DIAGNOSIS — D3501 Benign neoplasm of right adrenal gland: Secondary | ICD-10-CM | POA: Diagnosis not present

## 2018-02-09 DIAGNOSIS — C787 Secondary malignant neoplasm of liver and intrahepatic bile duct: Secondary | ICD-10-CM | POA: Insufficient documentation

## 2018-02-09 DIAGNOSIS — Z79899 Other long term (current) drug therapy: Secondary | ICD-10-CM | POA: Insufficient documentation

## 2018-02-09 DIAGNOSIS — Z7982 Long term (current) use of aspirin: Secondary | ICD-10-CM | POA: Insufficient documentation

## 2018-02-09 DIAGNOSIS — R740 Nonspecific elevation of levels of transaminase and lactic acid dehydrogenase [LDH]: Secondary | ICD-10-CM | POA: Diagnosis not present

## 2018-02-09 DIAGNOSIS — I2699 Other pulmonary embolism without acute cor pulmonale: Secondary | ICD-10-CM | POA: Diagnosis not present

## 2018-02-09 DIAGNOSIS — Z5111 Encounter for antineoplastic chemotherapy: Secondary | ICD-10-CM | POA: Insufficient documentation

## 2018-02-09 DIAGNOSIS — R634 Abnormal weight loss: Secondary | ICD-10-CM | POA: Diagnosis not present

## 2018-02-09 DIAGNOSIS — Z7901 Long term (current) use of anticoagulants: Secondary | ICD-10-CM | POA: Diagnosis not present

## 2018-02-09 DIAGNOSIS — M1990 Unspecified osteoarthritis, unspecified site: Secondary | ICD-10-CM | POA: Diagnosis not present

## 2018-02-09 DIAGNOSIS — E039 Hypothyroidism, unspecified: Secondary | ICD-10-CM | POA: Insufficient documentation

## 2018-02-09 NOTE — Telephone Encounter (Signed)
Scheduled appt per 5/2 los - Gave patient AVS and calender per los.  

## 2018-02-10 MED ORDER — ONDANSETRON HCL 8 MG PO TABS: 8.0000 mg | ORAL_TABLET | Freq: Two times a day (BID) | ORAL | 1 refills | Status: DC | PRN

## 2018-02-10 MED ORDER — PROCHLORPERAZINE MALEATE 10 MG PO TABS: 10.0000 mg | ORAL_TABLET | Freq: Four times a day (QID) | ORAL | 1 refills | Status: DC | PRN

## 2018-02-10 NOTE — Progress Notes (Signed)
START ON PATHWAY REGIMEN - Pancreatic     A cycle is every 28 days (3 weeks on and 1 week off):     Gemcitabine   **Always confirm dose/schedule in your pharmacy ordering system**    Patient Characteristics: Adenocarcinoma, Resectable, Adjuvant, Positive Margins Histology: Adenocarcinoma Current evidence of distant metastases<= No AJCC T Category: T3 AJCC N Category: N2 AJCC M Category: M0 AJCC 8 Stage Grouping: III Treatment Setting: Adjuvant Intent of Therapy: Curative Intent, Discussed with Patient

## 2018-02-13 ENCOUNTER — Other Ambulatory Visit: Payer: Self-pay | Admitting: *Deleted

## 2018-02-13 ENCOUNTER — Encounter: Payer: Self-pay | Admitting: *Deleted

## 2018-02-13 DIAGNOSIS — I10 Essential (primary) hypertension: Secondary | ICD-10-CM | POA: Diagnosis not present

## 2018-02-13 DIAGNOSIS — C250 Malignant neoplasm of head of pancreas: Secondary | ICD-10-CM | POA: Diagnosis not present

## 2018-02-13 DIAGNOSIS — R946 Abnormal results of thyroid function studies: Secondary | ICD-10-CM | POA: Diagnosis not present

## 2018-02-13 NOTE — Patient Outreach (Signed)
[name]) Care Management  02/13/2018  Lauren Richard 01/21/1936 [id number]    Transition of care (successful)  RN received a call from pt today and introduced the [facility] program and services. RN explained the purpose for today's call. Pt indicated she had her follow up appointment today with her primary provider. Pt states she has had surgery and has been recommended to attend a dietitian class. In addition to a class related to pending radiation and chemotherapy that has bee recommended for her to receive.  Pt indicates she is receiving [facility] for PT/Ot and the RN has visited with no additional recommendation other then a toilet seat that she will have her son to obtain and assist applying to her commode. RN inquired on any needs and reviewed all pt's medications and completed the transition of care template.   Patient was recently discharged from hospital and all medications have been reviewed.  Raina Mina, RN Care Management Coordinator [place] Office [phone]

## 2018-02-13 NOTE — Patient Outreach (Signed)
[place] [facility]) Care Management  02/13/2018  Lauren Richard 23-Jun-1936 [id number]    Transition of care (Unsuccessful)  RN 3rd attempt to reach pt however unsuccessful and only able to leave a HIPAA approved voice message requesting a call back. Outreach letter has been sent and case will be closed on 5/8 based upon inability to reach this member.  Raina Mina, RN Care Management Coordinator [place] Office [phone]

## 2018-02-14 ENCOUNTER — Encounter: Payer: PPO | Admitting: Nutrition

## 2018-02-14 DIAGNOSIS — I129 Hypertensive chronic kidney disease with stage 1 through stage 4 chronic kidney disease, or unspecified chronic kidney disease: Secondary | ICD-10-CM | POA: Diagnosis not present

## 2018-02-14 DIAGNOSIS — Z6831 Body mass index (BMI) 31.0-31.9, adult: Secondary | ICD-10-CM | POA: Diagnosis not present

## 2018-02-14 DIAGNOSIS — D630 Anemia in neoplastic disease: Secondary | ICD-10-CM | POA: Diagnosis not present

## 2018-02-14 DIAGNOSIS — Z7982 Long term (current) use of aspirin: Secondary | ICD-10-CM | POA: Diagnosis not present

## 2018-02-14 DIAGNOSIS — E440 Moderate protein-calorie malnutrition: Secondary | ICD-10-CM | POA: Diagnosis not present

## 2018-02-14 DIAGNOSIS — E669 Obesity, unspecified: Secondary | ICD-10-CM | POA: Diagnosis not present

## 2018-02-14 DIAGNOSIS — D631 Anemia in chronic kidney disease: Secondary | ICD-10-CM | POA: Diagnosis not present

## 2018-02-14 DIAGNOSIS — C250 Malignant neoplasm of head of pancreas: Secondary | ICD-10-CM | POA: Diagnosis not present

## 2018-02-14 DIAGNOSIS — Z9049 Acquired absence of other specified parts of digestive tract: Secondary | ICD-10-CM | POA: Diagnosis not present

## 2018-02-14 DIAGNOSIS — Z483 Aftercare following surgery for neoplasm: Secondary | ICD-10-CM | POA: Diagnosis not present

## 2018-02-14 DIAGNOSIS — M2040 Other hammer toe(s) (acquired), unspecified foot: Secondary | ICD-10-CM | POA: Diagnosis not present

## 2018-02-14 DIAGNOSIS — N189 Chronic kidney disease, unspecified: Secondary | ICD-10-CM | POA: Diagnosis not present

## 2018-02-14 DIAGNOSIS — R339 Retention of urine, unspecified: Secondary | ICD-10-CM | POA: Diagnosis not present

## 2018-02-15 ENCOUNTER — Encounter: Payer: PPO | Admitting: Nutrition

## 2018-02-15 ENCOUNTER — Telehealth (INDEPENDENT_AMBULATORY_CARE_PROVIDER_SITE_OTHER): Payer: Self-pay | Admitting: Orthopaedic Surgery

## 2018-02-15 ENCOUNTER — Telehealth: Payer: Self-pay | Admitting: Rheumatology

## 2018-02-15 NOTE — Telephone Encounter (Signed)
Patient called to notify the office that she just had major surgery / mass on her pancrease and undergoing chemo.  Patient cancelled her appointment on 02/20/18 and will call back to reschedule when she is done with her rehab.

## 2018-02-15 NOTE — Telephone Encounter (Signed)
Patient called to notify the office that she just had major surgery / mass on her pancrease and undergoing chemo.  Patient cancelled her appointment on 03/29/18 and will call back to reschedule when she is done with her rehab.

## 2018-02-16 ENCOUNTER — Inpatient Hospital Stay: Payer: PPO

## 2018-02-16 VITALS — BP 134/80 | HR 60 | Temp 97.8°F | Resp 18

## 2018-02-16 DIAGNOSIS — C250 Malignant neoplasm of head of pancreas: Secondary | ICD-10-CM

## 2018-02-16 DIAGNOSIS — Z5111 Encounter for antineoplastic chemotherapy: Secondary | ICD-10-CM | POA: Diagnosis not present

## 2018-02-16 LAB — CBC WITH DIFFERENTIAL (CANCER CENTER ONLY)
Basophils Absolute: 0 10*3/uL (ref 0.0–0.1)
Basophils Relative: 0 %
EOS PCT: 1 %
Eosinophils Absolute: 0 10*3/uL (ref 0.0–0.5)
HEMATOCRIT: 34.7 % — AB (ref 34.8–46.6)
Hemoglobin: 11.2 g/dL — ABNORMAL LOW (ref 11.6–15.9)
LYMPHS PCT: 33 %
Lymphs Abs: 2.1 10*3/uL (ref 0.9–3.3)
MCH: 29 pg (ref 25.1–34.0)
MCHC: 32.3 g/dL (ref 31.5–36.0)
MCV: 89.9 fL (ref 79.5–101.0)
MONO ABS: 0.5 10*3/uL (ref 0.1–0.9)
Monocytes Relative: 7 %
Neutro Abs: 3.8 10*3/uL (ref 1.5–6.5)
Neutrophils Relative %: 59 %
PLATELETS: 318 10*3/uL (ref 145–400)
RBC: 3.86 MIL/uL (ref 3.70–5.45)
RDW: 13.5 % (ref 11.2–14.5)
WBC Count: 6.4 10*3/uL (ref 3.9–10.3)

## 2018-02-16 LAB — CMP (CANCER CENTER ONLY)
ALT: 42 U/L (ref 0–55)
AST: 53 U/L — ABNORMAL HIGH (ref 5–34)
Albumin: 3.4 g/dL — ABNORMAL LOW (ref 3.5–5.0)
Alkaline Phosphatase: 147 U/L (ref 40–150)
Anion gap: 7 (ref 3–11)
BILIRUBIN TOTAL: 0.3 mg/dL (ref 0.2–1.2)
BUN: 8 mg/dL (ref 7–26)
CHLORIDE: 109 mmol/L (ref 98–109)
CO2: 22 mmol/L (ref 22–29)
CREATININE: 0.89 mg/dL (ref 0.60–1.10)
Calcium: 9.3 mg/dL (ref 8.4–10.4)
GFR, EST NON AFRICAN AMERICAN: 60 mL/min — AB (ref >60)
Glucose, Bld: 114 mg/dL (ref 70–140)
Potassium: 3.5 mmol/L (ref 3.5–5.1)
Sodium: 138 mmol/L (ref 136–145)
TOTAL PROTEIN: 7.3 g/dL (ref 6.4–8.3)

## 2018-02-16 MED ORDER — PROCHLORPERAZINE MALEATE 10 MG PO TABS
ORAL_TABLET | ORAL | Status: AC
Start: 2018-02-16 — End: 2018-02-16
  Filled 2018-02-16: qty 1

## 2018-02-16 MED ORDER — PROCHLORPERAZINE MALEATE 10 MG PO TABS
10.0000 mg | ORAL_TABLET | Freq: Once | ORAL | Status: AC
  Administered 2018-02-16: 10 mg via ORAL

## 2018-02-16 MED ORDER — SODIUM CHLORIDE 0.9 % IV SOLN
Freq: Once | INTRAVENOUS | Status: AC
  Administered 2018-02-16: 15:00:00 via INTRAVENOUS

## 2018-02-16 MED ORDER — SODIUM CHLORIDE 0.9 % IV SOLN
2000.0000 mg | Freq: Once | INTRAVENOUS | Status: AC
  Administered 2018-02-16: 2000 mg via INTRAVENOUS
  Filled 2018-02-16: qty 52.6

## 2018-02-16 NOTE — Patient Instructions (Signed)
[place] Discharge Instructions for Patients Receiving Chemotherapy  Today you received the following chemotherapy agents Gemaar  To help prevent nausea and vomiting after your treatment, we encourage you to take your nausea medication as directed   If you develop nausea and vomiting that is not controlled by your nausea medication, call the clinic.   BELOW ARE SYMPTOMS THAT SHOULD BE REPORTED IMMEDIATELY:  *FEVER GREATER THAN 100.5 F  *CHILLS WITH OR WITHOUT FEVER  NAUSEA AND VOMITING THAT IS NOT CONTROLLED WITH YOUR NAUSEA MEDICATION  *UNUSUAL SHORTNESS OF BREATH  *UNUSUAL BRUISING OR BLEEDING  TENDERNESS IN MOUTH AND THROAT WITH OR WITHOUT PRESENCE OF ULCERS  *URINARY PROBLEMS  *BOWEL PROBLEMS  UNUSUAL RASH Items with * indicate a potential emergency and should be followed up as soon as possible.  Feel free to call the clinic should you have any questions or concerns. The clinic phone number is [phone].  Please show the [facility] at check-in to the Emergency Department and triage nurse.   Gemcitabine (Gemzar) injection What is this medicine? GEMCITABINE (jem SIT a been) is a chemotherapy drug. This medicine is used to treat many types of cancer like breast cancer, lung cancer, pancreatic cancer, and ovarian cancer. This medicine may be used for other purposes; ask your health care provider or pharmacist if you have questions. COMMON BRAND NAME(S): Gemzar What should I tell my health care provider before I take this medicine? They need to know if you have any of these conditions: -blood disorders -infection -kidney disease -liver disease -recent or ongoing radiation therapy -an unusual or allergic reaction to gemcitabine, other chemotherapy, other medicines, foods, dyes, or preservatives -pregnant or trying to get pregnant -breast-feeding How should I use this medicine? This drug is given as an infusion into a vein. It is  administered in a hospital or clinic by a specially trained health care professional. Talk to your pediatrician regarding the use of this medicine in children. Special care may be needed. Overdosage: If you think you have taken too much of this medicine contact a poison control center or emergency room at once. NOTE: This medicine is only for you. Do not share this medicine with others. What if I miss a dose? It is important not to miss your dose. Call your doctor or health care professional if you are unable to keep an appointment. What may interact with this medicine? -medicines to increase blood counts like filgrastim, pegfilgrastim, sargramostim -some other chemotherapy drugs like cisplatin -vaccines Talk to your doctor or health care professional before taking any of these medicines: -acetaminophen -aspirin -ibuprofen -ketoprofen -naproxen This list may not describe all possible interactions. Give your health care provider a list of all the medicines, herbs, non-prescription drugs, or dietary supplements you use. Also tell them if you smoke, drink alcohol, or use illegal drugs. Some items may interact with your medicine. What should I watch for while using this medicine? Visit your doctor for checks on your progress. This drug may make you feel generally unwell. This is not uncommon, as chemotherapy can affect healthy cells as well as cancer cells. Report any side effects. Continue your course of treatment even though you feel ill unless your doctor tells you to stop. In some cases, you may be given additional medicines to help with side effects. Follow all directions for their use. Call your doctor or health care professional for advice if you get a fever, chills or sore throat, or other symptoms of a  cold or flu. Do not treat yourself. This drug decreases your body's ability to fight infections. Try to avoid being around people who are sick. This medicine may increase your risk to bruise  or bleed. Call your doctor or health care professional if you notice any unusual bleeding. Be careful brushing and flossing your teeth or using a toothpick because you may get an infection or bleed more easily. If you have any dental work done, tell your dentist you are receiving this medicine. Avoid taking products that contain aspirin, acetaminophen, ibuprofen, naproxen, or ketoprofen unless instructed by your doctor. These medicines may hide a fever. Women should inform their doctor if they wish to become pregnant or think they might be pregnant. There is a potential for serious side effects to an unborn child. Talk to your health care professional or pharmacist for more information. Do not breast-feed an infant while taking this medicine. What side effects may I notice from receiving this medicine? Side effects that you should report to your doctor or health care professional as soon as possible: -allergic reactions like skin rash, itching or hives, swelling of the face, lips, or tongue -low blood counts - this medicine may decrease the number of white blood cells, red blood cells and platelets. You may be at increased risk for infections and bleeding. -signs of infection - fever or chills, cough, sore throat, pain or difficulty passing urine -signs of decreased platelets or bleeding - bruising, pinpoint red spots on the skin, black, tarry stools, blood in the urine -signs of decreased red blood cells - unusually weak or tired, fainting spells, lightheadedness -breathing problems -chest pain -mouth sores -nausea and vomiting -pain, swelling, redness at site where injected -pain, tingling, numbness in the hands or feet -stomach pain -swelling of ankles, feet, hands -unusual bleeding Side effects that usually do not require medical attention (report to your doctor or health care professional if they continue or are bothersome): -constipation -diarrhea -hair loss -loss of appetite -stomach  upset This list may not describe all possible side effects. Call your doctor for medical advice about side effects. You may report side effects to FDA at 1-800-FDA-1088. Where should I keep my medicine? This drug is given in a hospital or clinic and will not be stored at home. NOTE: This sheet is a summary. It may not cover all possible information. If you have questions about this medicine, talk to your doctor, pharmacist, or health care provider.  2018 Elsevier/Gold Standard (2008-02-06 18:45:54)

## 2018-02-16 NOTE — Progress Notes (Signed)
  Oncology Nurse Navigator Documentation  Navigator Location: [facility] (02/16/18 1327)   )Navigator Encounter Type: Lobby (02/16/18 1327) Met with patient and family members in the lobby to re-introduce myself and to explain the role of GI navigator. I provided my contact information and encouraged patient to call me with questions or concerns. Today is patient's first chemo treatment.                       Treatment Phase: First Chemo Tx (02/16/18 1327)     Interventions: Psycho-social support (02/16/18 1327)            Acuity: Level 2 (02/16/18 1327)         Time Spent with Patient: 15 (02/16/18 1327)

## 2018-02-17 LAB — CANCER ANTIGEN 19-9: CA 19-9: 198 U/mL — ABNORMAL HIGH (ref 0–35)

## 2018-02-20 ENCOUNTER — Ambulatory Visit (INDEPENDENT_AMBULATORY_CARE_PROVIDER_SITE_OTHER): Payer: PPO | Admitting: Orthopaedic Surgery

## 2018-02-21 DIAGNOSIS — R339 Retention of urine, unspecified: Secondary | ICD-10-CM | POA: Diagnosis not present

## 2018-02-21 DIAGNOSIS — N189 Chronic kidney disease, unspecified: Secondary | ICD-10-CM | POA: Diagnosis not present

## 2018-02-21 DIAGNOSIS — E669 Obesity, unspecified: Secondary | ICD-10-CM | POA: Diagnosis not present

## 2018-02-21 DIAGNOSIS — Z7982 Long term (current) use of aspirin: Secondary | ICD-10-CM | POA: Diagnosis not present

## 2018-02-21 DIAGNOSIS — E440 Moderate protein-calorie malnutrition: Secondary | ICD-10-CM | POA: Diagnosis not present

## 2018-02-21 DIAGNOSIS — D631 Anemia in chronic kidney disease: Secondary | ICD-10-CM | POA: Diagnosis not present

## 2018-02-21 DIAGNOSIS — C250 Malignant neoplasm of head of pancreas: Secondary | ICD-10-CM | POA: Diagnosis not present

## 2018-02-21 DIAGNOSIS — M2040 Other hammer toe(s) (acquired), unspecified foot: Secondary | ICD-10-CM | POA: Diagnosis not present

## 2018-02-21 DIAGNOSIS — D630 Anemia in neoplastic disease: Secondary | ICD-10-CM | POA: Diagnosis not present

## 2018-02-21 DIAGNOSIS — Z9049 Acquired absence of other specified parts of digestive tract: Secondary | ICD-10-CM | POA: Diagnosis not present

## 2018-02-21 DIAGNOSIS — I129 Hypertensive chronic kidney disease with stage 1 through stage 4 chronic kidney disease, or unspecified chronic kidney disease: Secondary | ICD-10-CM | POA: Diagnosis not present

## 2018-02-21 DIAGNOSIS — Z6831 Body mass index (BMI) 31.0-31.9, adult: Secondary | ICD-10-CM | POA: Diagnosis not present

## 2018-02-21 DIAGNOSIS — Z483 Aftercare following surgery for neoplasm: Secondary | ICD-10-CM | POA: Diagnosis not present

## 2018-02-22 NOTE — Progress Notes (Signed)
[place]  [phone] [phone]  Clinic Follow Up Note   Patient Care Team: Glendale Chard, MD as PCP - General (Internal Medicine) 02/23/2018  CHIEF COMPLAINTS:  Follow up pancreas cancer   SUMMARY OF ONCOLOGY HISTORY Cancer Staging Adenocarcinoma of head of pancreas [facility]) Staging form: Exocrine Pancreas, AJCC 8th Edition - Clinical: Stage IB (cT2, cN0, cM0) - Unsigned - Pathologic stage from 01/05/2018: Stage III (pT3, pN2, cM0) - Signed by Truitt Merle, MD on 02/10/2018  Oncology History   Cancer Staging Adenocarcinoma of head of pancreas [facility]) Staging form: Exocrine Pancreas, AJCC 8th Edition - Clinical: Stage IB (cT2, cN0, cM0) - Unsigned - Pathologic stage from 01/05/2018: Stage III (pT3, pN2, cM0) - Signed by Truitt Merle, MD on 02/10/2018       Adenocarcinoma of head of pancreas ([place])   11/03/2017 Imaging    CT AP WO CONTRAST IMPRESSION: 1. Findings are concerning for common bile duct obstruction, potentially related to a primary pancreatic neoplasm, as detailed above. At this time this is associated with moderate to severe intra and extrahepatic biliary ductal dilatation. Further evaluation with MRI of the abdomen with and without IV gadolinium with MRCP should be considered in the near future to better evaluate these findings. Alternatively, if the patient is not likely to be able to adequately hold her breath during an MRI examination, pancreatic protocol CT scan would be recommended. 2. Biliary sludge in the gallbladder. No findings to suggest an acute cholecystitis at this time. 3. Small right adrenal adenoma incidentally noted. 4. Aortic atherosclerosis, in addition to 3 vessel coronary artery disease. Assessment for potential risk factor modification, dietary therapy or pharmacologic therapy may be warranted, if clinically indicated.  Aortic Atherosclerosis (ICD10-I70.0).       11/04/2017 Procedure    ERCP per Dr.  Benson Norway - The major papilla appeared normal. - A segmental biliary stricture was found. The stricture was malignant appearing. - The entire biliary tree was severely dilated, with a mass causing an obstruction. - A biliary sphincterotomy was performed. - One covered metal stent was placed into the common bile duct.      11/04/2017 Imaging    FINDINGS: Imaging demonstrates cannulation of the common bile duct. The common bile duct is dilated. The inferior common bile duct is narrowed. Final image demonstrates placement of a metallic stent across the area of narrowing into the duodenum.  IMPRESSION: Common bile duct stent placement.      11/11/2017 Procedure    EUS per Dr. Benson Norway Endosonographic Finding Findings: A round mass was identified in the pancreatic head. The mass was hypoechoic. The mass measured 30 mm by 22 mm in maximal cross-sectional diameter. The outer margins were irregular. Fine needle aspiration for cytology was performed. Color Doppler imaging was utilized prior to needle puncture to confirm a lack of significant vascular structures within the needle path. Five passes were made with the 25 gauge needle using a transduodenal approach. A stylet was used. A cytotechnologist was present to evaluate the adequacy of the specimen. The cellularity of the specimen was adequate. Final cytology results are pending. The procedure was technically difficult as the metallic stent obscured views of the lesion. It was also very difficult to obtain adequate views for FNA, however, adequate samples were obtained.      11/11/2017 Pathology Results    Diagnosis FINE NEEDLE ASPIRATION, ENDOSCOPIC, PANCREAS HEAD(SPECIMEN 1 OF 1 COLLECTED 11/11/17): ADENOCARCINOMA. Preliminary Diagnosis Intraoperative Diagnosis: SPINDLE CELL FRAGMENTS AND RARE ATYPICAL EPITHELIAL CELLS.(JDP)  11/24/2017 Initial Diagnosis    Adenocarcinoma of head of pancreas ([place])      12/01/2017 Imaging    CT Abdomen W  Contrast 12/01/17 IMPRESSION: 1. 21 x 20 mm pancreatic head/uncinate process lesion without obvious involvement/invasion of the major surrounding vascular structures. 2. Common bile duct stent in place with associated pneumobilia but decompression of the biliary tree. 3. Borderline enlarged celiac axis and periportal lymph nodes. 4. No findings to suggest hepatic metastatic disease. 5. Advanced aortic and branch vessel calcifications but no aneurysm or dissection. 6. Patchy right basilar atelectasis.      01/05/2018 Surgery    LAPAROSCOPY DIAGNOSTIC ERAS PATHWAY and WHIPPLE PROCEDURE by Dr. Barry Dienes and Dr. Johney Maine 01/05/18      01/05/2018 Pathology Results    Diagnosis 01/05/18 1. Gallbladder, and lymph node - CHRONIC CHOLECYSTITIS. - ONE BENIGN PERICYSTIC LYMPH NODE (0/1). - THERE IS NO EVIDENCE OF MALIGNANCY. 2. Lymph node, biopsy, Common Hepatic Artery - THERE IS NO EVIDENCE OF CARCINOMA IN 1 OF 1 LYMPH NODE (0/1). 3. Whipple procedure/resection - INVASIVE ADENOCARCINOMA, MODERATELY DIFFERENTIATED, SPANNING 4.0 CM. - PERINEURAL INVASION IS IDENTIFIED. - LYMPHOVASCULAR INVASION IS IDENTIFIED. - ADENOCARCINOMA IS PRESENT AT THE UNCINATE (RED) MARGIN. - ADENOCARCINOMA EXTENDS INTO PERIPANCREATIC SOFT TISSUE. - METASTATIC CARCINOMA IN 5 OF 29 LYMPH NODES (5/29), WITH EXTRACAPSULAR EXTENSION. - SEE ONCOLOGY TABLE BELOW. 4. Lymph node, biopsy, Portal - THERE IS NO EVIDENCE OF CARCINOMA IN 1 OF 1 LYMPH NODE (0/1). 5. Lymph node, biopsy, Superior mesenteric artery - THERE IS NO EVIDENCE OF CARCINOMA IN 1 OF 1 LYMPH NODE (0/1). 6. Lymph node, biopsy, Ligament of Treitz - THERE IS NO EVIDENCE OF CARCINOMA IN 1 OF 1 LYMPH NODE (0/1)      01/05/2018 - 01/14/2018 Hospital Admission    Admit date: 01/05/18 Admission diagnosis: Fever from infection Additional comments: According to Discharge summary Pt was admitted to the ICU following and she had an epidural for pain control and low dose  fentanyl PCA. Her NGT was able to be pulled on POD 2.5.  She had her foley pulled initially while epidural was still in and this had to be reinserted. She developed a fever, but UA was positive.  Once this was treated, she defervesced.  Culture did not grow anything. Her catheter was pulled after epidural was removed and she did not have any issues.  She had an initial setback when she went from clears to fulls the first time, but n/v resolved.  Her PCA was d/c'd.  She was OOB and worked well with PT.  She had some initial electrolyte abnormalities that resolved.  Her drains were removed without issue.  She had a central line removed without incident.         01/05/2018 Pathology Results    Gallbladder, and lymph node with chronic cholecystitis. One benign pericystic lymph node (0/1). There is no evidence of malignancy. Lymph node, biopsy, Common Hepatic Artery with no evidence of carcinoma in 1 of 1 lymph nodes (0/1). Whipple procedure/resection with invasive adenocarcinoma, moderately differentiated, spanning 4.0 cm. Perineural invasion is identified. Lymphovascular invasion is identified. Adenocarcinoma is present at the uncinate (red) margin. Adenocarcinoma extends into peripancreatic soft tissue. metastatic carcinoma in 5 of 29 lymph nodes (5/29), with extracapsular extension. Lymph node, biopsy, Portal with no evidence of carcinoma in 1 of 1 lymph node (0/1). Lymph node, biopsy, Superior mesenteric artery with no evidence of carcinoma in 1 of 1 lymph node (0/1). Lymph node, biopsy, Ligament of Treitz with no  evidence of carcinoma in 1 of 1 lymph node (0/1).        01/05/2018 Cancer Staging    Staging form: Exocrine Pancreas, AJCC 8th Edition - Pathologic stage from 01/05/2018: Stage III (pT3, pN2, cM0) - Signed by Truitt Merle, MD on 02/10/2018      02/15/2018 -  Chemotherapy    The patient had gemcitabine (GEMZAR) 2,000 mg in sodium chloride 0.9 % 250 mL chemo infusion, 2,014 mg, Intravenous,  Once, 2 of  6 cycles Administration: 2,000 mg (02/16/2018), 2,000 mg (02/23/2018)  for chemotherapy treatment.       HISTORY OF PRESENTING ILLNESS:  Lauren Richard 82 y.o. female is here because of newly diagnosed pancreas cancer. She presented to ED on 10/18/17 complaining of weakness and chest discomfort, she reported a 10-day history of anorexia with intermittent nausea and vomiting.  She was evaluated and found to be in A. Fib with RVR, EKG without ischemia.  CTA negative for PE.  Symptoms improved and she was discharged home on Xarelto and CCB with follow-up at A. fib clinic on 10/24/2017; found to have abnormal TFTs on workup.  She was found to not have A. fib, Xarelto was stopped.  She presented to her PCP for regularly scheduled follow-up on 11/03/2017; with complaints of decreased appetite, 5-10 lbs weight loss, fatigue, and intermittent dizziness.  Had syncopal event there and found to have painless jaundice, was directly sent to ED for further workup, LFTs markedly elevated, T bili in the ED was elevated to 19.7.   During hospitalization she had AKI; had noncontrast CT on 11/03/2017 with findings of a mass like area measuring 2.1 x 2.3 x 3.1 cm in the head of the pancreas immediately below the level of the common bile duct truncation as well as findings concerning for common bile duct obstruction with moderate to severe intra-and extrahepatic biliary ductal dilatation.  No lymphadenopathy noted in the abdomen or pelvis.  ERCP showed the entire biliary tree to be severely dilated and diffusely dilated with a mass causing the obstruction. Biliary sphincterotomy was performed and a metal stent was successfully placed into the common bile duct.  She improved clinically and LFTs improved, she was discharged home to follow-up with Dr. Benson Norway outpatient with EUS with FNA.  Biopsy confirmed adenocarcinoma of the pancreatic head.  Past medical history is significant for hyperlipidemia, hypertension, arthritis.  Mild anemia  found in July 2018, Hgb 10.2 at that time, 9.6 during recent hospitalization.  She has history of oral iron supplementation and received a blood transfusion with hysterectomy in the past. Endocrinology consult is pending for hyperthyroidism due to altered TFTs; cardizem was stopped and she was started on beta-blocker for tachycardia during hospitalization.  She has positive family history for renal cancer in her mother and lung cancer in her father, he was a smoker.  She has occasional wine socially, never smoked.  She is widowed, did not have children; lives alone, independent of all ADLs, she does not drive but has family close by who drive and support her.  Prior to her illness she exercised at the [facility] twice weekly; remains active in her church community.   Today she feels well, her appetite is returning.  She feels more like her usual self.  Denies pain, nausea.  BMs are regular.  She has mild back pain at her baseline, controlled with Tylenol as needed.  CURRENT THERAPY: Pending adjuvant chemotherapy  INTERVAL HISTORY:  AZA DANTES female presents to the office today  for follow up. She is accompanied by a family member today. She did well with her chemotherapy treatment last week. She denies feeling fatigued following treatment as she was able to continue her daily activities. She still has residual soreness to the incision site.    Labs today, 02/23/2018 show CMP PENDING. CBC with Hgb at 10.9 and HCT at 34.   On review of systems, she reports improving bowel movements. she denies abdominal pain, vomiting, and any other symptoms. Pertinent positives are listed and detailed within the above HPI.    MEDICAL HISTORY:  Past Medical History:  Diagnosis Date  . Adenocarcinoma of head of pancreas ([place]) 11/24/2017  . Arthritis    "all over" (04/26/2017); knees, shoulder  . Essential hypertension 03/03/2017  . History of blood transfusion 1979   "when I had my hysterectomy"  . Hyperlipidemia     . Hypothyroidism   . Obesity 03/03/2017  . Osteoarthritis 03/03/2017  . Pneumonia    as a child    SURGICAL HISTORY: Past Surgical History:  Procedure Laterality Date  . BREAST CYST EXCISION Right    "cut 6 cysts out"  . CYST EXCISION Left    "off my toe"  . DILATION AND CURETTAGE OF UTERUS     "before hysterectomy"  . ERCP N/A 11/04/2017   Procedure: ENDOSCOPIC RETROGRADE CHOLANGIOPANCREATOGRAPHY (ERCP);  Surgeon: Carol Ada, MD;  Location: [facility];  Service: Endoscopy;  Laterality: N/A;  . EUS N/A 11/11/2017   Procedure: UPPER ENDOSCOPIC ULTRASOUND (EUS) LINEAR;  Surgeon: Carol Ada, MD;  Location: [facility];  Service: Endoscopy;  Laterality: N/A;  . FINE NEEDLE ASPIRATION N/A 11/11/2017   Procedure: FINE NEEDLE ASPIRATION (FNA) LINEAR;  Surgeon: Carol Ada, MD;  Location: [facility];  Service: Endoscopy;  Laterality: N/A;  . INGUINAL HERNIA REPAIR Right   . JOINT REPLACEMENT    . LAPAROSCOPY N/A 01/05/2018   Procedure: LAPAROSCOPY DIAGNOSTIC ERAS PATHWAY;  Surgeon: Stark Klein, MD;  Location: [place];  Service: General;  Laterality: N/A;  [place]  . REDUCTION MAMMAPLASTY Bilateral 1980  . TONSILLECTOMY     "when I small"  . TOTAL ABDOMINAL HYSTERECTOMY  1979  . TOTAL KNEE ARTHROPLASTY Right 04/26/2017  . TOTAL KNEE ARTHROPLASTY Left 04/26/2017   Procedure: LEFT TOTAL KNEE ARTHROPLASTY;  Surgeon: [name], MD;  Location: [place];  Service: Orthopedics;  Laterality: Left;  . WHIPPLE PROCEDURE N/A 01/05/2018   Procedure: WHIPPLE PROCEDURE;  Surgeon: Stark Klein, MD;  Location: [facility];  Service: General;  Laterality: N/A;    SOCIAL HISTORY: Social History   Socioeconomic History  . Marital status: Widowed    Spouse name: Not on file  . Number of children: Not on file  . Years of education: Not on file  . Highest education level: Not on file  Occupational History  . Not on file  Social Needs  . Financial resource strain: Not on file  . Food  insecurity:    Worry: Not on file    Inability: Not on file  . Transportation needs:    Medical: Not on file    Non-medical: Not on file  Tobacco Use  . Smoking status: Never Smoker  . Smokeless tobacco: Never Used  Substance and Sexual Activity  . Alcohol use: No    Alcohol/week: 0.0 oz    Frequency: Never    Comment: occasional glass of wine   . Drug use: No  . Sexual activity: Never  Lifestyle  . Physical activity:  Days per week: Not on file    Minutes per session: Not on file  . Stress: Not on file  Relationships  . Social connections:    Talks on phone: Not on file    Gets together: Not on file    Attends religious service: Not on file    Active member of club or organization: Not on file    Attends meetings of clubs or organizations: Not on file    Relationship status: Not on file  . Intimate partner violence:    Fear of current or ex partner: Not on file    Emotionally abused: Not on file    Physically abused: Not on file    Forced sexual activity: Not on file  Other Topics Concern  . Not on file  Social History Narrative  . Not on file    FAMILY HISTORY: Family History  Problem Relation Age of Onset  . Kidney cancer Mother 70  . Lung cancer Father 75       lung, smoker  . Congestive Heart Failure Sister   . Asthma Sister   . Atrial fibrillation Sister   . Hypertension Sister   . Diabetes Sister     ALLERGIES:  has No Known Allergies.  MEDICATIONS:  Current Outpatient Medications  Medication Sig Dispense Refill  . acetaminophen (TYLENOL) 500 MG tablet Take 500-1,000 mg by mouth 2 (two) times daily as needed for moderate pain or headache.     . Amino Acids-Protein Hydrolys (FEEDING SUPPLEMENT, PRO-STAT SUGAR FREE 64,) LIQD Take 30 mLs by mouth 2 (two) times daily. 900 mL 0  . amLODipine (NORVASC) 5 MG tablet Take 5 mg by mouth daily.     Marland Kitchen aspirin 81 MG tablet Take 81 mg by mouth daily.    Marland Kitchen atenolol (TENORMIN) 25 MG tablet Take 1 tablet (25 mg  total) by mouth daily. 30 tablet 0  . Cholecalciferol (VITAMIN D) 2000 units tablet Take 2,000 Units by mouth daily.    Marland Kitchen gabapentin (NEURONTIN) 100 MG capsule Take 2 capsules (200 mg total) by mouth 2 (two) times daily. 30 capsule 0  . magnesium oxide (MAG-OX) 400 (241.3 Mg) MG tablet Take 1 tablet (400 mg total) by mouth 2 (two) times daily. 60 tablet 0  . ondansetron (ZOFRAN) 8 MG tablet Take 1 tablet (8 mg total) by mouth 2 (two) times daily as needed (Nausea or vomiting). 30 tablet 1  . pantoprazole (PROTONIX) 40 MG tablet Take 1 tablet (40 mg total) by mouth daily at 12 noon. 30 tablet 0  . potassium chloride SA (K-DUR,KLOR-CON) 20 MEQ tablet Take 2 tablets (40 mEq total) by mouth daily. 30 tablet 0  . PRESCRIPTION MEDICATION Apply 1 application topically 4 (four) times daily as needed (pain). Dic 3%, Bac 2%, lid - pril 2-5%, Menth 1% cream    . prochlorperazine (COMPAZINE) 10 MG tablet Take 1 tablet (10 mg total) by mouth every 6 (six) hours as needed (Nausea or vomiting). 30 tablet 1  . trolamine salicylate (ASPERCREME) 10 % cream Apply 1 application topically as needed for muscle pain.     No current facility-administered medications for this visit.    Facility-Administered Medications Ordered in Other Visits  Medication Dose Route Frequency Provider Last Rate Last Dose  . heparin lock flush 100 unit/mL  500 Units Intracatheter Once PRN Truitt Merle, MD      . sodium chloride flush (NS) 0.9 % injection 10 mL  10 mL Intracatheter PRN Truitt Merle, MD  REVIEW OF SYSTEMS:  Constitutional: Denies fevers, chills or abnormal night sweats (+) weight loss (+) fatigue, improving (+) low appetite, improving Eyes: Denies blurriness of vision, double vision or watery eyes Ears, nose, mouth, throat, and face: Denies mucositis or sore throat Respiratory: Denies cough, dyspnea or wheezes Cardiovascular: Denies palpitation, chest discomfort or lower extremity swelling Gastrointestinal:  Denies  constipation, diarrhea, heartburn or change in bowel habits (+) presented with nausea/vomiting, improved now Skin: Denies abnormal skin rashes (+) jaundice at presentation Lymphatics: Denies new lymphadenopathy or easy bruising Neurological:Denies numbness, tingling or new weaknesses (+) presented with dizziness, denies today Behavioral/Psych: Mood is stable, no new changes  All other systems were reviewed with the patient and are negative.  PHYSICAL EXAMINATION:  ECOG PERFORMANCE STATUS: 1 - Symptomatic but completely ambulatory  Vitals:   02/23/18 0940  BP: (!) 117/94  Pulse: 64  Resp: 17  Temp: 98.4 F (36.9 C)  SpO2: 100%   Filed Weights   02/23/18 0940  Weight: 179 lb 11.2 oz (81.5 kg)    GENERAL:alert, no distress and comfortable SKIN: skin color, texture, turgor are normal, no rashes or significant lesions EYES: normal, conjunctiva are pink and non-injected, (+) mild scleral icterus OROPHARYNX:no exudate, no erythema and lips, buccal mucosa, and tongue normal  NECK: supple, thyroid normal size, non-tender, without nodularity LYMPH:  no palpable cervical, supraclavicular, axillary, or inguinal lymphadenopathy  LUNGS: clear to auscultation bilaterally with normal breathing effort HEART: regular rate & rhythm and no murmurs and no lower extremity edema ABDOMEN:abdomen soft, non-tender and normal bowel sounds.  No hepatomegaly. Midline incision from umbilical that is healed well with minimal scar tissues.  Musculoskeletal:no cyanosis of digits and no clubbing  PSYCH: alert & oriented x 3 with fluent speech NEURO: no focal motor/sensory deficits  LABORATORY DATA:  I have reviewed the data as listed CBC Latest Ref Rng & Units 02/23/2018 02/16/2018 01/13/2018  WBC 3.9 - 10.3 K/uL 3.9 6.4 9.0  Hemoglobin 11.6 - 15.9 g/dL 10.9(L) 11.2(L) 9.9(L)  Hematocrit 34.8 - 46.6 % 34.0(L) 34.7(L) 30.9(L)  Platelets 145 - 400 K/uL 240 318 304   CMP Latest Ref Rng & Units 02/23/2018 02/16/2018  01/13/2018  Glucose 70 - 140 mg/dL 136 114 127(H)  BUN 7 - 26 mg/dL 7 8 <5(L)  Creatinine 0.60 - 1.10 mg/dL 0.81 0.89 0.73  Sodium 136 - 145 mmol/L 139 138 139  Potassium 3.5 - 5.1 mmol/L 3.3(L) 3.5 3.4(L)  Chloride 98 - 109 mmol/L 110(H) 109 107  CO2 22 - 29 mmol/L 20(L) 22 18(L)  Calcium 8.4 - 10.4 mg/dL 9.1 9.3 9.3  Total Protein 6.4 - 8.3 g/dL 7.1 7.3 6.7  Total Bilirubin 0.2 - 1.2 mg/dL 0.4 0.3 0.5  Alkaline Phos 40 - 150 U/L 140 147 64  AST 5 - 34 U/L 133(H) 53(H) 16  ALT 0 - 55 U/L 79(H) 42 11(L)   PATHOLOGY Diagnosis 11/11/17 FINE NEEDLE ASPIRATION, ENDOSCOPIC, PANCREAS HEAD(SPECIMEN 1 OF 1 COLLECTED 11/11/17): ADENOCARCINOMA. Preliminary Diagnosis Intraoperative Diagnosis: SPINDLE CELL FRAGMENTS AND RARE ATYPICAL EPITHELIAL CELLS.(JDP)  Whipple Procedure, 01/05/2018 1. Gallbladder, and lymph node - CHRONIC CHOLECYSTITIS. - ONE BENIGN PERICYSTIC LYMPH NODE (0/1). - THERE IS NO EVIDENCE OF MALIGNANCY. 2. Lymph node, biopsy, Common Hepatic Artery - THERE IS NO EVIDENCE OF CARCINOMA IN 1 OF 1 LYMPH NODE (0/1). 3. Whipple procedure/resection - INVASIVE ADENOCARCINOMA, MODERATELY DIFFERENTIATED, SPANNING 4.0 CM. - PERINEURAL INVASION IS IDENTIFIED. - LYMPHOVASCULAR INVASION IS IDENTIFIED. - ADENOCARCINOMA IS PRESENT AT THE UNCINATE (RED) MARGIN. -  ADENOCARCINOMA EXTENDS INTO PERIPANCREATIC SOFT TISSUE. - METASTATIC CARCINOMA IN 5 OF 29 LYMPH NODES (5/29), WITH EXTRACAPSULAR EXTENSION. - SEE ONCOLOGY TABLE BELOW. 4. Lymph node, biopsy, Portal - THERE IS NO EVIDENCE OF CARCINOMA IN 1 OF 1 LYMPH NODE (0/1). 5. Lymph node, biopsy, Superior mesenteric artery - THERE IS NO EVIDENCE OF CARCINOMA IN 1 OF 1 LYMPH NODE (0/1). 6. Lymph node, biopsy, Ligament of Treitz - THERE IS NO EVIDENCE OF CARCINOMA IN 1 OF 1 LYMPH NODE (0/1).  RADIOGRAPHIC STUDIES: I have personally reviewed the radiological images as listed and agreed with the findings in the report. No results  found.  ASSESSMENT & PLAN: Lauren Richard is a pleasant 82 y.o. AAF who presented with syncopal event and obstructive juandice found to have malignant pancreatic head mass.   1. Adenocarcinoma of pancreatic head, pT3N2M0, stage III, G2, with positive margin  -previously reviewed her medical records, imaging, and pathology in detail with the patient and family. She has what appears to be localized adenocarcinoma on pancreatic head.  -She underwent Whipple surgery, and tolerated surgery well.  She has recovered well from surgery, except not gaining weight back. -I discussed the recent pathology results with the patient and her family member today (02/09/2018).  She has locally advanced disease with 5+ lymph nodes, and positive margin at the uncinate -I discussed the nature history of pancreatic cancer, and very high risk of recurrence, especially with the multiple positive nodes and positive margin.  Her case was discussed in our tumor board, and we recommend adjuvant chemotherapy and radiation if she agrees. -Although she has good performance status, and has recovered well from surgery, she is a 82 year old, lives alone, I do not think she is a candidate for intensive chemotherapy.  I recommend adjuvant chemotherapy starting in 1-2 weeks consisted of 6 months of gemcitabine with or without capecitabine, to reduce her risk of recurrence. -If she is able to tolerate gemcitacine well, then we will make a decision on whether to add PO xeloda from cycle 2.  I will likely give Xeloda with concurrent adjuvant radiation. -Patient started adjuvant chemotherapy gemcitabine last week, tolerated the first cycle well well, without significant side effects. -Labs reviewed, she is mildly anemic, has developed slightly worsening transaminitis, possibly related to chemo, continue gemcitabine. -Due to the elevated CA 19.9 tumor marker on 02/16/2018 with results of 198, I advised and recommended that we follow up with a CT  scan to rule out recurrence. -due to the patient doing well, we will proceed with gemcitabine 3 weeks on and 1 week off, during her off week, we will order a CT scan with follow up afterwards.  -CT CAP with contrast in 2 weeks -Lab, flush, chemo gemcitabine in 2, 3 and 4 weeks -F/u with me or Lacie in 3 and 4 weeks     2. Weight loss, decreased appetite, fatigue  -She has 13 lbs weight loss in 2 months, since symptom onset.  -Patient has lost 16 lbs since her surgery, I previously encouraged her to increase her nutrition supplement --Patient to follow up with nutritionist, on 02/15/2018. -Overall improved  3. HTN, hyperlipidemia -With PCP -We we will monitor her blood pressure closely during her chemotherapy, and adjust her medication if needed  4.  Transaminitis -She has elevated ALT and AST, slightly worse today, total bilirubin normal, possible related to chemotherapy -Continue observation and follow-up closely -If get worse, will probably reduce her gemcitabine dose or hold it next week.  5. SOCIAL SUPPORT  -  Patient lives alone, her niece and nephew will bring her to the office visit and treatment, and support her at home. 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  -CT CAP with contrast in 2 weeks -Lab, flush, chemo gemcitabine in 2, 3 and 4 weeks -F/u with me or Lacie in 3 and 4 weeks     Orders Placed This Encounter  Procedures  . CT Abdomen Pelvis W Contrast    Standing Status:   Future    Standing Expiration Date:   02/23/2019    Order Specific Question:   If indicated for the ordered procedure, I authorize the administration of contrast media per Radiology protocol    Answer:   Yes    Order Specific Question:   Preferred imaging location?    Answer:   [facility]    Order Specific Question:   Is Oral Contrast requested for this exam?    Answer:   Yes, Per Radiology protocol    Order Specific  Question:   Radiology Contrast Protocol - do NOT remove file path    Answer:   \\[email or web address]  . CT Chest W Contrast    Standing Status:   Future    Standing Expiration Date:   02/23/2019    Order Specific Question:   If indicated for the ordered procedure, I authorize the administration of contrast media per Radiology protocol    Answer:   Yes    Order Specific Question:   Preferred imaging location?    Answer:   [facility]    Order Specific Question:   Radiology Contrast Protocol - do NOT remove file path    Answer:   \\[email or web address]    All questions were answered. The patient knows to call the clinic with any problems, questions or concerns. I spent 20 minutes counseling the patient face to face. The total time spent in the appointment was 25 minutes and more than 50% was on counseling and review of test results.  This document serves as a record of services personally performed by Truitt Merle, MD. It was created on her behalf by Steva Colder, a trained medical scribe. The creation of this record is based on the scribe's personal observations and the provider's statements to them.   I have reviewed the above documentation for accuracy and completeness, and I agree with the above.     Truitt Merle, MD 02/23/2018

## 2018-02-23 ENCOUNTER — Encounter: Payer: Self-pay | Admitting: Hematology

## 2018-02-23 ENCOUNTER — Telehealth: Payer: Self-pay | Admitting: Hematology

## 2018-02-23 ENCOUNTER — Inpatient Hospital Stay: Payer: PPO

## 2018-02-23 ENCOUNTER — Inpatient Hospital Stay: Payer: PPO | Admitting: Nutrition

## 2018-02-23 ENCOUNTER — Inpatient Hospital Stay (HOSPITAL_BASED_OUTPATIENT_CLINIC_OR_DEPARTMENT_OTHER): Payer: PPO | Admitting: Hematology

## 2018-02-23 VITALS — BP 117/94 | HR 64 | Temp 98.4°F | Resp 17 | Ht 67.0 in | Wt 179.7 lb

## 2018-02-23 DIAGNOSIS — Z5111 Encounter for antineoplastic chemotherapy: Secondary | ICD-10-CM | POA: Diagnosis not present

## 2018-02-23 DIAGNOSIS — E785 Hyperlipidemia, unspecified: Secondary | ICD-10-CM | POA: Diagnosis not present

## 2018-02-23 DIAGNOSIS — M1990 Unspecified osteoarthritis, unspecified site: Secondary | ICD-10-CM

## 2018-02-23 DIAGNOSIS — R740 Nonspecific elevation of levels of transaminase and lactic acid dehydrogenase [LDH]: Secondary | ICD-10-CM | POA: Diagnosis not present

## 2018-02-23 DIAGNOSIS — C250 Malignant neoplasm of head of pancreas: Secondary | ICD-10-CM

## 2018-02-23 DIAGNOSIS — R634 Abnormal weight loss: Secondary | ICD-10-CM | POA: Diagnosis not present

## 2018-02-23 DIAGNOSIS — E039 Hypothyroidism, unspecified: Secondary | ICD-10-CM

## 2018-02-23 DIAGNOSIS — I10 Essential (primary) hypertension: Secondary | ICD-10-CM | POA: Diagnosis not present

## 2018-02-23 LAB — CMP (CANCER CENTER ONLY)
ALT: 79 U/L — ABNORMAL HIGH (ref 0–55)
AST: 133 U/L — ABNORMAL HIGH (ref 5–34)
Albumin: 3.2 g/dL — ABNORMAL LOW (ref 3.5–5.0)
Alkaline Phosphatase: 140 U/L (ref 40–150)
Anion gap: 9 (ref 3–11)
BUN: 7 mg/dL (ref 7–26)
CO2: 20 mmol/L — ABNORMAL LOW (ref 22–29)
Calcium: 9.1 mg/dL (ref 8.4–10.4)
Chloride: 110 mmol/L — ABNORMAL HIGH (ref 98–109)
Creatinine: 0.81 mg/dL (ref 0.60–1.10)
GFR, Est AFR Am: >60 mL/min (ref >60)
GFR, Estimated: >60 mL/min (ref >60)
Glucose, Bld: 136 mg/dL (ref 70–140)
Potassium: 3.3 mmol/L — ABNORMAL LOW (ref 3.5–5.1)
Sodium: 139 mmol/L (ref 136–145)
Total Bilirubin: 0.4 mg/dL (ref 0.2–1.2)
Total Protein: 7.1 g/dL (ref 6.4–8.3)

## 2018-02-23 LAB — CBC WITH DIFFERENTIAL (CANCER CENTER ONLY)
Basophils Absolute: 0 10*3/uL (ref 0.0–0.1)
Basophils Relative: 0 %
Eosinophils Absolute: 0 10*3/uL (ref 0.0–0.5)
Eosinophils Relative: 1 %
HCT: 34 % — ABNORMAL LOW (ref 34.8–46.6)
Hemoglobin: 10.9 g/dL — ABNORMAL LOW (ref 11.6–15.9)
Lymphocytes Relative: 53 %
Lymphs Abs: 2.1 10*3/uL (ref 0.9–3.3)
MCH: 28.8 pg (ref 25.1–34.0)
MCHC: 32.1 g/dL (ref 31.5–36.0)
MCV: 89.7 fL (ref 79.5–101.0)
Monocytes Absolute: 0.2 10*3/uL (ref 0.1–0.9)
Monocytes Relative: 6 %
Neutro Abs: 1.6 10*3/uL (ref 1.5–6.5)
Neutrophils Relative %: 40 %
Platelet Count: 240 10*3/uL (ref 145–400)
RBC: 3.79 MIL/uL (ref 3.70–5.45)
RDW: 13.5 % (ref 11.2–14.5)
WBC Count: 3.9 10*3/uL (ref 3.9–10.3)

## 2018-02-23 MED ORDER — PROCHLORPERAZINE MALEATE 10 MG PO TABS
ORAL_TABLET | ORAL | Status: AC
  Filled 2018-02-23: qty 1

## 2018-02-23 MED ORDER — HEPARIN SOD (PORK) LOCK FLUSH 100 UNIT/ML IV SOLN
500.0000 [IU] | Freq: Once | INTRAVENOUS | Status: DC | PRN
  Filled 2018-02-23: qty 5

## 2018-02-23 MED ORDER — SODIUM CHLORIDE 0.9% FLUSH
10.0000 mL | INTRAVENOUS | Status: DC | PRN
  Filled 2018-02-23: qty 10

## 2018-02-23 MED ORDER — PROCHLORPERAZINE MALEATE 10 MG PO TABS
10.0000 mg | ORAL_TABLET | Freq: Once | ORAL | Status: AC
  Administered 2018-02-23: 10 mg via ORAL

## 2018-02-23 MED ORDER — SODIUM CHLORIDE 0.9 % IV SOLN
Freq: Once | INTRAVENOUS | Status: AC
  Administered 2018-02-23: 11:00:00 via INTRAVENOUS

## 2018-02-23 MED ORDER — SODIUM CHLORIDE 0.9 % IV SOLN
2000.0000 mg | Freq: Once | INTRAVENOUS | Status: AC
  Administered 2018-02-23: 2000 mg via INTRAVENOUS
  Filled 2018-02-23: qty 52.6

## 2018-02-23 NOTE — Telephone Encounter (Signed)
Appointments scheduled AVS/Calendar printed per 5/16 los °

## 2018-02-23 NOTE — Patient Instructions (Signed)
[facility] °Discharge Instructions for Patients Receiving Chemotherapy ° °Today you received the following chemotherapy agents Gemzar ° °To help prevent nausea and vomiting after your treatment, we encourage you to take your nausea medication as directed. °  °If you develop nausea and vomiting that is not controlled by your nausea medication, call the clinic.  ° °BELOW ARE SYMPTOMS THAT SHOULD BE REPORTED IMMEDIATELY: °· *FEVER GREATER THAN 100.5 F °· *CHILLS WITH OR WITHOUT FEVER °· NAUSEA AND VOMITING THAT IS NOT CONTROLLED WITH YOUR NAUSEA MEDICATION °· *UNUSUAL SHORTNESS OF BREATH °· *UNUSUAL BRUISING OR BLEEDING °· TENDERNESS IN MOUTH AND THROAT WITH OR WITHOUT PRESENCE OF ULCERS °· *URINARY PROBLEMS °· *BOWEL PROBLEMS °· UNUSUAL RASH °Items with * indicate a potential emergency and should be followed up as soon as possible. ° °Feel free to call the clinic should you have any questions or concerns. The clinic phone number is [phone]. ° °Please show the CHEMO ALERT CARD at check-in to the Emergency Department and triage nurse. ° ° °

## 2018-02-23 NOTE — Progress Notes (Signed)
82 year old female diagnosed with pancreas cancer.  She is a patient of Dr. Burr Medico.  Past medical history includes hyperlipidemia, hypertension, arthritis, pneumonia, and obesity.  Medications include vitamin D, magnesium oxide, Zofran, Protonix, K-Dur, Compazine.  Labs were reviewed.  Height: 67 inches. Weight: 179.7 pounds. Usual body weight: 200 pounds on January 09, 2018. BMI: 28.14.  Patient is status post Whipple procedure on March 28. Patient had anorexia with nausea and vomiting in January. She lost her appetite after her Whipple surgery. She lost approximately 20 pounds. Patient reports she is now back at home and her appetite has started to improve. Patient reports she hates milk so does not want to try oral nutrition supplements.  Nutrition diagnosis: Unintended weight loss related to pancreas cancer as evidenced by 20 pound weight loss over 6 weeks.  Intervention: Patient was educated to consume smaller more frequent meals and snacks 6 times daily. Provided fact sheets on poor appetite and ways to increase calories and protein. Provided a sample of Ensure clear along with coupon and encourage patient to try this juice based supplement. Questions were answered.  Teach back method used.  Contact information given.  Monitoring, evaluation, goals: Patient will tolerate increased calories and protein to minimize further weight loss.  Next visit: Thursday, June 20 during infusion.  **Disclaimer: This note was dictated with voice recognition software. Similar sounding words can inadvertently be transcribed and this note may contain transcription errors which may not have been corrected upon publication of note.**

## 2018-02-23 NOTE — Progress Notes (Signed)
Per Dr Burr Medico OK to give Gemzar today with [phone] and ALT 79

## 2018-02-24 DIAGNOSIS — D631 Anemia in chronic kidney disease: Secondary | ICD-10-CM | POA: Diagnosis not present

## 2018-02-24 DIAGNOSIS — E440 Moderate protein-calorie malnutrition: Secondary | ICD-10-CM | POA: Diagnosis not present

## 2018-02-24 DIAGNOSIS — Z6831 Body mass index (BMI) 31.0-31.9, adult: Secondary | ICD-10-CM | POA: Diagnosis not present

## 2018-02-24 DIAGNOSIS — M2040 Other hammer toe(s) (acquired), unspecified foot: Secondary | ICD-10-CM | POA: Diagnosis not present

## 2018-02-24 DIAGNOSIS — I129 Hypertensive chronic kidney disease with stage 1 through stage 4 chronic kidney disease, or unspecified chronic kidney disease: Secondary | ICD-10-CM | POA: Diagnosis not present

## 2018-02-24 DIAGNOSIS — C250 Malignant neoplasm of head of pancreas: Secondary | ICD-10-CM | POA: Diagnosis not present

## 2018-02-24 DIAGNOSIS — Z7982 Long term (current) use of aspirin: Secondary | ICD-10-CM | POA: Diagnosis not present

## 2018-02-24 DIAGNOSIS — Z9049 Acquired absence of other specified parts of digestive tract: Secondary | ICD-10-CM | POA: Diagnosis not present

## 2018-02-24 DIAGNOSIS — E669 Obesity, unspecified: Secondary | ICD-10-CM | POA: Diagnosis not present

## 2018-02-24 DIAGNOSIS — Z483 Aftercare following surgery for neoplasm: Secondary | ICD-10-CM | POA: Diagnosis not present

## 2018-02-24 DIAGNOSIS — N189 Chronic kidney disease, unspecified: Secondary | ICD-10-CM | POA: Diagnosis not present

## 2018-02-24 DIAGNOSIS — D630 Anemia in neoplastic disease: Secondary | ICD-10-CM | POA: Diagnosis not present

## 2018-02-24 DIAGNOSIS — R339 Retention of urine, unspecified: Secondary | ICD-10-CM | POA: Diagnosis not present

## 2018-03-02 ENCOUNTER — Inpatient Hospital Stay: Payer: PPO

## 2018-03-02 ENCOUNTER — Telehealth: Payer: Self-pay

## 2018-03-02 ENCOUNTER — Other Ambulatory Visit: Payer: Self-pay | Admitting: Hematology

## 2018-03-02 VITALS — BP 125/65 | HR 63 | Temp 97.5°F | Resp 16

## 2018-03-02 DIAGNOSIS — Z5111 Encounter for antineoplastic chemotherapy: Secondary | ICD-10-CM | POA: Diagnosis not present

## 2018-03-02 DIAGNOSIS — C250 Malignant neoplasm of head of pancreas: Secondary | ICD-10-CM

## 2018-03-02 LAB — CMP (CANCER CENTER ONLY)
ALBUMIN: 3.1 g/dL — AB (ref 3.5–5.0)
ALK PHOS: 128 U/L (ref 40–150)
ALT: 89 U/L — AB (ref 0–55)
AST: 151 U/L — ABNORMAL HIGH (ref 5–34)
Anion gap: 9 (ref 3–11)
BILIRUBIN TOTAL: 0.4 mg/dL (ref 0.2–1.2)
BUN: 8 mg/dL (ref 7–26)
CALCIUM: 9.2 mg/dL (ref 8.4–10.4)
CO2: 23 mmol/L (ref 22–29)
CREATININE: 0.74 mg/dL (ref 0.60–1.10)
Chloride: 108 mmol/L (ref 98–109)
GFR, Est AFR Am: >60 mL/min (ref >60)
GFR, Estimated: >60 mL/min (ref >60)
GLUCOSE: 109 mg/dL (ref 70–140)
Potassium: 2.7 mmol/L — CL (ref 3.5–5.1)
SODIUM: 140 mmol/L (ref 136–145)
TOTAL PROTEIN: 7.3 g/dL (ref 6.4–8.3)

## 2018-03-02 LAB — CBC WITH DIFFERENTIAL (CANCER CENTER ONLY)
BASOS ABS: 0 10*3/uL (ref 0.0–0.1)
Basophils Relative: 0 %
EOS PCT: 0 %
Eosinophils Absolute: 0 10*3/uL (ref 0.0–0.5)
HEMATOCRIT: 30.3 % — AB (ref 34.8–46.6)
Hemoglobin: 10 g/dL — ABNORMAL LOW (ref 11.6–15.9)
Lymphocytes Relative: 63 %
Lymphs Abs: 1.9 10*3/uL (ref 0.9–3.3)
MCH: 28.8 pg (ref 25.1–34.0)
MCHC: 33 g/dL (ref 31.5–36.0)
MCV: 87.4 fL (ref 79.5–101.0)
MONO ABS: 0.3 10*3/uL (ref 0.1–0.9)
MONOS PCT: 8 %
NEUTROS ABS: 0.9 10*3/uL — AB (ref 1.5–6.5)
Neutrophils Relative %: 29 %
PLATELETS: 168 10*3/uL (ref 145–400)
RBC: 3.47 MIL/uL — ABNORMAL LOW (ref 3.70–5.45)
RDW: 13.9 % (ref 11.2–14.5)
WBC Count: 3.1 10*3/uL — ABNORMAL LOW (ref 3.9–10.3)

## 2018-03-02 MED ORDER — PROCHLORPERAZINE MALEATE 10 MG PO TABS
ORAL_TABLET | ORAL | Status: AC
  Filled 2018-03-02: qty 1

## 2018-03-02 MED ORDER — PROCHLORPERAZINE MALEATE 10 MG PO TABS: 10.0000 mg | ORAL_TABLET | Freq: Once | ORAL | Status: DC

## 2018-03-02 MED ORDER — SODIUM CHLORIDE 0.9 % IV SOLN
Freq: Once | INTRAVENOUS | Status: AC
  Administered 2018-03-02: 10:00:00 via INTRAVENOUS

## 2018-03-02 MED ORDER — SODIUM CHLORIDE 0.9 % IV SOLN
800.0000 mg/m2 | Freq: Once | INTRAVENOUS | Status: AC
  Administered 2018-03-02: 1596 mg via INTRAVENOUS
  Filled 2018-03-02: qty 41.98

## 2018-03-02 MED ORDER — POTASSIUM CHLORIDE 10 MEQ/100ML IV SOLN
10.0000 meq | INTRAVENOUS | Status: AC
  Administered 2018-03-02 (×2): 10 meq via INTRAVENOUS
  Filled 2018-03-02: qty 100

## 2018-03-02 NOTE — Telephone Encounter (Signed)
Lauren Richard in Infusion instructed patient to increase potassium to 40 meq (2-20 meq tabs) twice daily for 5 days per Dr. Burr Medico.

## 2018-03-02 NOTE — Patient Instructions (Signed)
[facility] Discharge Instructions for Patients Receiving Chemotherapy  Today you received the following chemotherapy agents: Gemzar.   To help prevent nausea and vomiting after your treatment, we encourage you to take your nausea medication as directed.  Increase Potassium supplement to 40 mEq twice a day for 5 days.     If you develop nausea and vomiting that is not controlled by your nausea medication, call the clinic.   BELOW ARE SYMPTOMS THAT SHOULD BE REPORTED IMMEDIATELY:  *FEVER GREATER THAN 100.5 F  *CHILLS WITH OR WITHOUT FEVER  NAUSEA AND VOMITING THAT IS NOT CONTROLLED WITH YOUR NAUSEA MEDICATION  *UNUSUAL SHORTNESS OF BREATH  *UNUSUAL BRUISING OR BLEEDING  TENDERNESS IN MOUTH AND THROAT WITH OR WITHOUT PRESENCE OF ULCERS  *URINARY PROBLEMS  *BOWEL PROBLEMS  UNUSUAL RASH Items with * indicate a potential emergency and should be followed up as soon as possible.  Feel free to call the clinic should you have any questions or concerns. The clinic phone number is [phone].  Please show the [facility] at check-in to the Emergency Department and triage nurse.    Hypokalemia Hypokalemia means that the amount of potassium in the blood is lower than normal.Potassium is a chemical that helps regulate the amount of fluid in the body (electrolyte). It also stimulates muscle tightening (contraction) and helps nerves work properly.Normally, most of the body's potassium is inside of cells, and only a very small amount is in the blood. Because the amount in the blood is so small, minor changes to potassium levels in the blood can be life-threatening. What are the causes? This condition may be caused by:  Antibiotic medicine.  Diarrhea or vomiting. Taking too much of a medicine that helps you have a bowel movement (laxative) can cause diarrhea and lead to hypokalemia.  Chronic kidney disease (CKD).  Medicines that help the body get rid of excess  fluid (diuretics).  Eating disorders, such as bulimia.  Low magnesium levels in the body.  Sweating a lot.  What are the signs or symptoms? Symptoms of this condition include:  Weakness.  Constipation.  Fatigue.  Muscle cramps.  Mental confusion.  Skipped heartbeats or irregular heartbeat (palpitations).  Tingling or numbness.  How is this diagnosed? This condition is diagnosed with a blood test. How is this treated? Hypokalemia can be treated by taking potassium supplements by mouth or adjusting the medicines that you take. Treatment may also include eating more foods that contain a lot of potassium. If your potassium level is very low, you may need to get potassium through an IV tube in one of your veins and be monitored in the hospital. Follow these instructions at home:  Take over-the-counter and prescription medicines only as told by your health care provider. This includes vitamins and supplements.  Eat a healthy diet. A healthy diet includes fresh fruits and vegetables, whole grains, healthy fats, and lean proteins.  If instructed, eat more foods that contain a lot of potassium, such as: ? Nuts, such as peanuts and pistachios. ? Seeds, such as sunflower seeds and pumpkin seeds. ? Peas, lentils, and lima beans. ? Whole grain and bran cereals and breads. ? Fresh fruits and vegetables, such as apricots, avocado, bananas, cantaloupe, kiwi, oranges, tomatoes, asparagus, and potatoes. ? Orange juice. ? Tomato juice. ? Red meats. ? Yogurt.  Keep all follow-up visits as told by your health care provider. This is important. Contact a health care provider if:  You have weakness that gets  worse.  You feel your heart pounding or racing.  You vomit.  You have diarrhea.  You have diabetes (diabetes mellitus) and you have trouble keeping your blood sugar (glucose) in your target range. Get help right away if:  You have chest pain.  You have shortness of  breath.  You have vomiting or diarrhea that lasts for more than 2 days.  You faint. This information is not intended to replace advice given to you by your health care provider. Make sure you discuss any questions you have with your health care provider. Document Released: 09/27/2005 Document Revised: 05/15/2016 Document Reviewed: 05/15/2016 Elsevier Interactive Patient Education  2018 [employer].

## 2018-03-02 NOTE — Telephone Encounter (Addendum)
Called Central Scheduling made appointment for CT Scan Chest/Abd/Pelvis for Tuesday 5/28 at 4:00 pm to arrive 3:45 pm. Nothing by mouth 4 hours prior to scan.   Patient notified about drinking the prep. This will be available for pick up at the front desk.    If questions urged patient to call us back.

## 2018-03-02 NOTE — Telephone Encounter (Signed)
-----   Message from Jarvis Morgan, RN sent at 03/02/2018 11:21 AM EDT ----- Regarding: FW: Dr. Burr Medico scan f/u  Malachy Mood,  Please help look into this. Thanks, Thu.  ----- Message ----- From: Rennis Harding, RN Sent: 03/02/2018  11:14 AM To: Onc Triage Nurse [facility] Subject: Dr. Burr Medico scan f/u                              Patient concerned she has not been contacted in regards to a scan that she thought she was to be getting on 03/09/2018.  If possible please f/u with patient.

## 2018-03-02 NOTE — Progress Notes (Signed)
Okay for treatment today with ANC level 0.9 per Dr. Burr Medico, and for potassium results of 2.7, and AST/ALT results per Dr. Burr Medico.

## 2018-03-07 ENCOUNTER — Ambulatory Visit (HOSPITAL_COMMUNITY): Payer: PPO

## 2018-03-09 ENCOUNTER — Ambulatory Visit (HOSPITAL_COMMUNITY)
Admission: RE | Admit: 2018-03-09 | Discharge: 2018-03-09 | Disposition: A | Discharge status: Home / Self Care | Payer: PPO | Source: Ambulatory Visit | Attending: Hematology | Admitting: Hematology

## 2018-03-09 ENCOUNTER — Inpatient Hospital Stay (HOSPITAL_BASED_OUTPATIENT_CLINIC_OR_DEPARTMENT_OTHER): Payer: PPO | Admitting: Hematology

## 2018-03-09 ENCOUNTER — Telehealth: Payer: Self-pay

## 2018-03-09 ENCOUNTER — Encounter: Payer: Self-pay | Admitting: Hematology

## 2018-03-09 VITALS — BP 130/76 | HR 92 | Temp 98.5°F | Resp 20 | Ht 67.0 in | Wt 174.5 lb

## 2018-03-09 DIAGNOSIS — C787 Secondary malignant neoplasm of liver and intrahepatic bile duct: Secondary | ICD-10-CM | POA: Diagnosis not present

## 2018-03-09 DIAGNOSIS — I2699 Other pulmonary embolism without acute cor pulmonale: Secondary | ICD-10-CM | POA: Diagnosis not present

## 2018-03-09 DIAGNOSIS — C250 Malignant neoplasm of head of pancreas: Secondary | ICD-10-CM

## 2018-03-09 DIAGNOSIS — R918 Other nonspecific abnormal finding of lung field: Secondary | ICD-10-CM | POA: Diagnosis not present

## 2018-03-09 DIAGNOSIS — R634 Abnormal weight loss: Secondary | ICD-10-CM | POA: Diagnosis not present

## 2018-03-09 DIAGNOSIS — E039 Hypothyroidism, unspecified: Secondary | ICD-10-CM

## 2018-03-09 DIAGNOSIS — I10 Essential (primary) hypertension: Secondary | ICD-10-CM

## 2018-03-09 DIAGNOSIS — Z9889 Other specified postprocedural states: Secondary | ICD-10-CM | POA: Insufficient documentation

## 2018-03-09 DIAGNOSIS — K6389 Other specified diseases of intestine: Secondary | ICD-10-CM | POA: Insufficient documentation

## 2018-03-09 DIAGNOSIS — K769 Liver disease, unspecified: Secondary | ICD-10-CM | POA: Insufficient documentation

## 2018-03-09 DIAGNOSIS — Z5111 Encounter for antineoplastic chemotherapy: Secondary | ICD-10-CM | POA: Diagnosis not present

## 2018-03-09 DIAGNOSIS — Z7189 Other specified counseling: Secondary | ICD-10-CM | POA: Insufficient documentation

## 2018-03-09 MED ORDER — IOPAMIDOL (ISOVUE-300) INJECTION 61%
INTRAVENOUS | Status: AC
  Filled 2018-03-09: qty 100

## 2018-03-09 MED ORDER — RIVAROXABAN (XARELTO) VTE STARTER PACK (15 & 20 MG): ORAL_TABLET | ORAL | 0 refills | Status: DC

## 2018-03-09 MED ORDER — IOPAMIDOL (ISOVUE-300) INJECTION 61%
100.0000 mL | Freq: Once | INTRAVENOUS | Status: AC | PRN
  Administered 2018-03-09: 100 mL via INTRAVENOUS

## 2018-03-09 NOTE — Progress Notes (Signed)
ON PATHWAY REGIMEN - Pancreatic  No Change  Continue With Treatment as Ordered.     A cycle is every 28 days (3 weeks on and 1 week off):     Gemcitabine   **Always confirm dose/schedule in your pharmacy ordering system**    Patient Characteristics: Adenocarcinoma, Resectable, Adjuvant, Positive Margins Histology: Adenocarcinoma Current evidence of distant metastases<= No AJCC T Category: T3 AJCC N Category: N2 AJCC M Category: M0 AJCC 8 Stage Grouping: III Treatment Setting: Adjuvant Intent of Therapy: Curative Intent, Discussed with Patient

## 2018-03-09 NOTE — Telephone Encounter (Signed)
Xarelto starter pack called into [facility] [phone] spoke directly to the pharmacist. Patient will bring coupon for free 30 day supply.

## 2018-03-09 NOTE — Telephone Encounter (Signed)
Called patient per Dr. Burr Medico instructed patient that Dr. Burr Medico wants to see her this afternoon to discuss her CT scan result.  Patient verbalized an understanding and will get her sister to bring her.

## 2018-03-09 NOTE — Progress Notes (Signed)
DISCONTINUE ON PATHWAY REGIMEN - Pancreatic     A cycle is every 28 days (3 weeks on and 1 week off):     Gemcitabine   **Always confirm dose/schedule in your pharmacy ordering system**    REASON: Disease Progression PRIOR TREATMENT: [id number]: Gemcitabine 1,000 mg/m2 D1, 8, 15 q28 Days x 1 Cycle TREATMENT RESPONSE: N/A - Adjuvant Therapy  START ON PATHWAY REGIMEN - Pancreatic     A cycle is every 28 days:     Nab-paclitaxel (protein bound)      Gemcitabine   **Always confirm dose/schedule in your pharmacy ordering system**    Patient Characteristics: Adenocarcinoma, Metastatic Disease, First Line, PS = 0, 1 Histology: Adenocarcinoma Current evidence of distant metastases<= Yes AJCC T Category: T3 AJCC N Category: N2 AJCC M Category: M1 AJCC 8 Stage Grouping: IV Line of Therapy: First Line  Intent of Therapy: Non-Curative / Palliative Intent, Discussed with Patient

## 2018-03-09 NOTE — Progress Notes (Signed)
[place]  [phone] Fax:([phone]) [phone]  Clinic Follow Up Note   Patient Care Team: Glendale Chard, MD as PCP - General (Internal Medicine)   Date of Service:  03/09/2018  CHIEF COMPLAINTS:  Discuss CT scan findings   SUMMARY OF ONCOLOGY HISTORY Cancer Staging Adenocarcinoma of head of pancreas [facility]) Staging form: Exocrine Pancreas, AJCC 8th Edition - Clinical: Stage IB (cT2, cN0, cM0) - Unsigned - Pathologic stage from 01/05/2018: Stage III (pT3, pN2, cM0) - Signed by Truitt Merle, MD on 02/10/2018  Oncology History   Cancer Staging Adenocarcinoma of head of pancreas [facility]) Staging form: Exocrine Pancreas, AJCC 8th Edition - Clinical: Stage IB (cT2, cN0, cM0) - Unsigned - Pathologic stage from 01/05/2018: Stage III (pT3, pN2, cM0) - Signed by Truitt Merle, MD on 02/10/2018       Adenocarcinoma of head of pancreas ([place])   11/03/2017 Imaging    CT AP WO CONTRAST IMPRESSION: 1. Findings are concerning for common bile duct obstruction, potentially related to a primary pancreatic neoplasm, as detailed above. At this time this is associated with moderate to severe intra and extrahepatic biliary ductal dilatation. Further evaluation with MRI of the abdomen with and without IV gadolinium with MRCP should be considered in the near future to better evaluate these findings. Alternatively, if the patient is not likely to be able to adequately hold her breath during an MRI examination, pancreatic protocol CT scan would be recommended. 2. Biliary sludge in the gallbladder. No findings to suggest an acute cholecystitis at this time. 3. Small right adrenal adenoma incidentally noted. 4. Aortic atherosclerosis, in addition to 3 vessel coronary artery disease. Assessment for potential risk factor modification, dietary therapy or pharmacologic therapy may be warranted, if clinically indicated.  Aortic Atherosclerosis (ICD10-I70.0).       11/04/2017 Procedure    ERCP per Dr. Benson Norway - The major papilla appeared normal. - A segmental biliary stricture was found. The stricture was malignant appearing. - The entire biliary tree was severely dilated, with a mass causing an obstruction. - A biliary sphincterotomy was performed. - One covered metal stent was placed into the common bile duct.      11/04/2017 Imaging    FINDINGS: Imaging demonstrates cannulation of the common bile duct. The common bile duct is dilated. The inferior common bile duct is narrowed. Final image demonstrates placement of a metallic stent across the area of narrowing into the duodenum.  IMPRESSION: Common bile duct stent placement.      11/11/2017 Procedure    EUS per Dr. Benson Norway Endosonographic Finding Findings: A round mass was identified in the pancreatic head. The mass was hypoechoic. The mass measured 30 mm by 22 mm in maximal cross-sectional diameter. The outer margins were irregular. Fine needle aspiration for cytology was performed. Color Doppler imaging was utilized prior to needle puncture to confirm a lack of significant vascular structures within the needle path. Five passes were made with the 25 gauge needle using a transduodenal approach. A stylet was used. A cytotechnologist was present to evaluate the adequacy of the specimen. The cellularity of the specimen was adequate. Final cytology results are pending. The procedure was technically difficult as the metallic stent obscured views of the lesion. It was also very difficult to obtain adequate views for FNA, however, adequate samples were obtained.      11/11/2017 Pathology Results    Diagnosis FINE NEEDLE ASPIRATION, ENDOSCOPIC, PANCREAS HEAD(SPECIMEN 1 OF 1 COLLECTED 11/11/17): ADENOCARCINOMA. Preliminary Diagnosis Intraoperative Diagnosis: SPINDLE CELL FRAGMENTS  AND RARE ATYPICAL EPITHELIAL CELLS.(JDP)      11/24/2017 Initial Diagnosis    Adenocarcinoma of head of pancreas ([place])      12/01/2017 Imaging     CT Abdomen W Contrast 12/01/17 IMPRESSION: 1. 21 x 20 mm pancreatic head/uncinate process lesion without obvious involvement/invasion of the major surrounding vascular structures. 2. Common bile duct stent in place with associated pneumobilia but decompression of the biliary tree. 3. Borderline enlarged celiac axis and periportal lymph nodes. 4. No findings to suggest hepatic metastatic disease. 5. Advanced aortic and branch vessel calcifications but no aneurysm or dissection. 6. Patchy right basilar atelectasis.      01/05/2018 Surgery    LAPAROSCOPY DIAGNOSTIC ERAS PATHWAY and WHIPPLE PROCEDURE by Dr. Barry Dienes and Dr. Johney Maine 01/05/18      01/05/2018 Pathology Results    Diagnosis 01/05/18 1. Gallbladder, and lymph node - CHRONIC CHOLECYSTITIS. - ONE BENIGN PERICYSTIC LYMPH NODE (0/1). - THERE IS NO EVIDENCE OF MALIGNANCY. 2. Lymph node, biopsy, Common Hepatic Artery - THERE IS NO EVIDENCE OF CARCINOMA IN 1 OF 1 LYMPH NODE (0/1). 3. Whipple procedure/resection - INVASIVE ADENOCARCINOMA, MODERATELY DIFFERENTIATED, SPANNING 4.0 CM. - PERINEURAL INVASION IS IDENTIFIED. - LYMPHOVASCULAR INVASION IS IDENTIFIED. - ADENOCARCINOMA IS PRESENT AT THE UNCINATE (RED) MARGIN. - ADENOCARCINOMA EXTENDS INTO PERIPANCREATIC SOFT TISSUE. - METASTATIC CARCINOMA IN 5 OF 29 LYMPH NODES (5/29), WITH EXTRACAPSULAR EXTENSION. - SEE ONCOLOGY TABLE BELOW. 4. Lymph node, biopsy, Portal - THERE IS NO EVIDENCE OF CARCINOMA IN 1 OF 1 LYMPH NODE (0/1). 5. Lymph node, biopsy, Superior mesenteric artery - THERE IS NO EVIDENCE OF CARCINOMA IN 1 OF 1 LYMPH NODE (0/1). 6. Lymph node, biopsy, Ligament of Treitz - THERE IS NO EVIDENCE OF CARCINOMA IN 1 OF 1 LYMPH NODE (0/1)      01/05/2018 - 01/14/2018 Hospital Admission    Admit date: 01/05/18 Admission diagnosis: Fever from infection Additional comments: According to Discharge summary Pt was admitted to the ICU following and she had an epidural for pain control and  low dose fentanyl PCA. Her NGT was able to be pulled on POD 2.5.  She had her foley pulled initially while epidural was still in and this had to be reinserted. She developed a fever, but UA was positive.  Once this was treated, she defervesced.  Culture did not grow anything. Her catheter was pulled after epidural was removed and she did not have any issues.  She had an initial setback when she went from clears to fulls the first time, but n/v resolved.  Her PCA was d/c'd.  She was OOB and worked well with PT.  She had some initial electrolyte abnormalities that resolved.  Her drains were removed without issue.  She had a central line removed without incident.         01/05/2018 Pathology Results    Gallbladder, and lymph node with chronic cholecystitis. One benign pericystic lymph node (0/1). There is no evidence of malignancy. Lymph node, biopsy, Common Hepatic Artery with no evidence of carcinoma in 1 of 1 lymph nodes (0/1). Whipple procedure/resection with invasive adenocarcinoma, moderately differentiated, spanning 4.0 cm. Perineural invasion is identified. Lymphovascular invasion is identified. Adenocarcinoma is present at the uncinate (red) margin. Adenocarcinoma extends into peripancreatic soft tissue. metastatic carcinoma in 5 of 29 lymph nodes (5/29), with extracapsular extension. Lymph node, biopsy, Portal with no evidence of carcinoma in 1 of 1 lymph node (0/1). Lymph node, biopsy, Superior mesenteric artery with no evidence of carcinoma in 1 of 1 lymph  node (0/1). Lymph node, biopsy, Ligament of Treitz with no evidence of carcinoma in 1 of 1 lymph node (0/1).        01/05/2018 Cancer Staging    Staging form: Exocrine Pancreas, AJCC 8th Edition - Pathologic stage from 01/05/2018: Stage III (pT3, pN2, cM0) - Signed by Truitt Merle, MD on 02/10/2018      02/16/2018 -  Chemotherapy    Gemcitabine 3 weeks on and 1 week off for 6 months starting on 02/16/18      03/09/2018 Imaging    CT CAP W contrast  03/09/18  IMPRESSION: 1. Acute pulmonary embolus demonstrated within the distal right main pulmonary artery extending into the right upper, right middle and right lower lobes. 2. Filling defect within the central right internal jugular vein may represent nonocclusive thrombus. 3. Interval development of multiple enhancing lesions within the liver most compatible with hepatic metastatic disease. 4. Interval Whipple procedure. Small cystic lesion at the site of the pancreatic duodenal anastomosis, potentially postprocedural in etiology. Recommend attention on follow-up. 5. Wall thickening and surrounding fat stranding proximal duodenum, potentially postprocedural in etiology. Recommend attention on follow-up. 6. Critical Value/emergent results were called by telephone at the time of interpretation on 03/09/2018 at 2:49 pm to Dr. Truitt Merle , who verbally acknowledged these results.       HISTORY OF PRESENTING ILLNESS:  JANEVA PEASTER 82 y.o. female is here because of newly diagnosed pancreas cancer. She presented to ED on 10/18/17 complaining of weakness and chest discomfort, she reported a 10-day history of anorexia with intermittent nausea and vomiting.  She was evaluated and found to be in A. Fib with RVR, EKG without ischemia.  CTA negative for PE.  Symptoms improved and she was discharged home on Xarelto and CCB with follow-up at A. fib clinic on 10/24/2017; found to have abnormal TFTs on workup.  She was found to not have A. fib, Xarelto was stopped.  She presented to her PCP for regularly scheduled follow-up on 11/03/2017; with complaints of decreased appetite, 5-10 lbs weight loss, fatigue, and intermittent dizziness.  Had syncopal event there and found to have painless jaundice, was directly sent to ED for further workup, LFTs markedly elevated, T bili in the ED was elevated to 19.7.   During hospitalization she had AKI; had noncontrast CT on 11/03/2017 with findings of a mass like area  measuring 2.1 x 2.3 x 3.1 cm in the head of the pancreas immediately below the level of the common bile duct truncation as well as findings concerning for common bile duct obstruction with moderate to severe intra-and extrahepatic biliary ductal dilatation.  No lymphadenopathy noted in the abdomen or pelvis.  ERCP showed the entire biliary tree to be severely dilated and diffusely dilated with a mass causing the obstruction. Biliary sphincterotomy was performed and a metal stent was successfully placed into the common bile duct.  She improved clinically and LFTs improved, she was discharged home to follow-up with Dr. Benson Norway outpatient with EUS with FNA.  Biopsy confirmed adenocarcinoma of the pancreatic head.  Past medical history is significant for hyperlipidemia, hypertension, arthritis.  Mild anemia found in July 2018, Hgb 10.2 at that time, 9.6 during recent hospitalization.  She has history of oral iron supplementation and received a blood transfusion with hysterectomy in the past. Endocrinology consult is pending for hyperthyroidism due to altered TFTs; cardizem was stopped and she was started on beta-blocker for tachycardia during hospitalization.  She has positive family history for renal cancer  in her mother and lung cancer in her father, he was a smoker.  She has occasional wine socially, never smoked.  She is widowed, did not have children; lives alone, independent of all ADLs, she does not drive but has family close by who drive and support her.  Prior to her illness she exercised at the [facility] twice weekly; remains active in her church community.   Today she feels well, her appetite is returning.  She feels more like her usual self.  Denies pain, nausea.  BMs are regular.  She has mild back pain at her baseline, controlled with Tylenol as needed.  CURRENT THERAPY:  Gemcitabine 3 weeks and 1 week off starting 02/16/18. Due to cancer recurrence, will add Abraxane to Gemcitabine and switch to 2 weeks on  and 1 weeks off starting 03/16/18    INTERVAL HISTORY:  JOSELYNE SPAKE female presents to the office today for follow up to discussed today's CT scan. She is accompanied by a family member today. She has a mild shortness of breath on exertion, which been stable for the past month.  She denies any chest pain, or other new discomfort.  No leg swelling.  On review of symptoms, pt denies chest pain or SOB at rest or cough or swelling of her legs.      MEDICAL HISTORY:  Past Medical History:  Diagnosis Date  . Adenocarcinoma of head of pancreas ([place]) 11/24/2017  . Arthritis    "all over" (04/26/2017); knees, shoulder  . Essential hypertension 03/03/2017  . History of blood transfusion 1979   "when I had my hysterectomy"  . Hyperlipidemia   . Hypothyroidism   . Obesity 03/03/2017  . Osteoarthritis 03/03/2017  . Pneumonia    as a child    SURGICAL HISTORY: Past Surgical History:  Procedure Laterality Date  . BREAST CYST EXCISION Right    "cut 6 cysts out"  . CYST EXCISION Left    "off my toe"  . DILATION AND CURETTAGE OF UTERUS     "before hysterectomy"  . ERCP N/A 11/04/2017   Procedure: ENDOSCOPIC RETROGRADE CHOLANGIOPANCREATOGRAPHY (ERCP);  Surgeon: Carol Ada, MD;  Location: [facility];  Service: Endoscopy;  Laterality: N/A;  . EUS N/A 11/11/2017   Procedure: UPPER ENDOSCOPIC ULTRASOUND (EUS) LINEAR;  Surgeon: Carol Ada, MD;  Location: [facility];  Service: Endoscopy;  Laterality: N/A;  . FINE NEEDLE ASPIRATION N/A 11/11/2017   Procedure: FINE NEEDLE ASPIRATION (FNA) LINEAR;  Surgeon: Carol Ada, MD;  Location: [facility];  Service: Endoscopy;  Laterality: N/A;  . INGUINAL HERNIA REPAIR Right   . JOINT REPLACEMENT    . LAPAROSCOPY N/A 01/05/2018   Procedure: LAPAROSCOPY DIAGNOSTIC ERAS PATHWAY;  Surgeon: Stark Klein, MD;  Location: [place];  Service: General;  Laterality: N/A;  [place]  . REDUCTION MAMMAPLASTY Bilateral 1980  . TONSILLECTOMY     "when I small"  .  TOTAL ABDOMINAL HYSTERECTOMY  1979  . TOTAL KNEE ARTHROPLASTY Right 04/26/2017  . TOTAL KNEE ARTHROPLASTY Left 04/26/2017   Procedure: LEFT TOTAL KNEE ARTHROPLASTY;  Surgeon: [name], MD;  Location: [place];  Service: Orthopedics;  Laterality: Left;  . WHIPPLE PROCEDURE N/A 01/05/2018   Procedure: WHIPPLE PROCEDURE;  Surgeon: Stark Klein, MD;  Location: [place];  Service: General;  Laterality: N/A;    SOCIAL HISTORY: Social History   Socioeconomic History  . Marital status: Widowed    Spouse name: Not on file  . Number of children: Not on file  . Years of  education: Not on file  . Highest education level: Not on file  Occupational History  . Not on file  Social Needs  . Financial resource strain: Not on file  . Food insecurity:    Worry: Not on file    Inability: Not on file  . Transportation needs:    Medical: Not on file    Non-medical: Not on file  Tobacco Use  . Smoking status: Never Smoker  . Smokeless tobacco: Never Used  Substance and Sexual Activity  . Alcohol use: No    Alcohol/week: 0.0 oz    Frequency: Never    Comment: occasional glass of wine   . Drug use: No  . Sexual activity: Never  Lifestyle  . Physical activity:    Days per week: Not on file    Minutes per session: Not on file  . Stress: Not on file  Relationships  . Social connections:    Talks on phone: Not on file    Gets together: Not on file    Attends religious service: Not on file    Active member of club or organization: Not on file    Attends meetings of clubs or organizations: Not on file    Relationship status: Not on file  . Intimate partner violence:    Fear of current or ex partner: Not on file    Emotionally abused: Not on file    Physically abused: Not on file    Forced sexual activity: Not on file  Other Topics Concern  . Not on file  Social History Narrative  . Not on file    FAMILY HISTORY: Family History  Problem Relation Age of Onset  . Kidney cancer Mother  98  . Lung cancer Father 50       lung, smoker  . Congestive Heart Failure Sister   . Asthma Sister   . Atrial fibrillation Sister   . Hypertension Sister   . Diabetes Sister     ALLERGIES:  has No Known Allergies.  MEDICATIONS:  Current Outpatient Medications  Medication Sig Dispense Refill  . acetaminophen (TYLENOL) 500 MG tablet Take 500-1,000 mg by mouth 2 (two) times daily as needed for moderate pain or headache.     . Amino Acids-Protein Hydrolys (FEEDING SUPPLEMENT, PRO-STAT SUGAR FREE 64,) LIQD Take 30 mLs by mouth 2 (two) times daily. 900 mL 0  . amLODipine (NORVASC) 5 MG tablet Take 5 mg by mouth daily.     Marland Kitchen aspirin 81 MG tablet Take 81 mg by mouth daily.    Marland Kitchen atenolol (TENORMIN) 25 MG tablet Take 1 tablet (25 mg total) by mouth daily. 30 tablet 0  . Cholecalciferol (VITAMIN D) 2000 units tablet Take 2,000 Units by mouth daily.    Marland Kitchen gabapentin (NEURONTIN) 100 MG capsule Take 2 capsules (200 mg total) by mouth 2 (two) times daily. 30 capsule 0  . magnesium oxide (MAG-OX) 400 (241.3 Mg) MG tablet Take 1 tablet (400 mg total) by mouth 2 (two) times daily. 60 tablet 0  . ondansetron (ZOFRAN) 8 MG tablet Take 1 tablet (8 mg total) by mouth 2 (two) times daily as needed (Nausea or vomiting). 30 tablet 1  . pantoprazole (PROTONIX) 40 MG tablet Take 1 tablet (40 mg total) by mouth daily at 12 noon. 30 tablet 0  . potassium chloride SA (K-DUR,KLOR-CON) 20 MEQ tablet Take 2 tablets (40 mEq total) by mouth daily. 30 tablet 0  . PRESCRIPTION MEDICATION Apply 1 application topically 4 (four)  times daily as needed (pain). Dic 3%, Bac 2%, lid - pril 2-5%, Menth 1% cream    . prochlorperazine (COMPAZINE) 10 MG tablet Take 1 tablet (10 mg total) by mouth every 6 (six) hours as needed (Nausea or vomiting). 30 tablet 1  . trolamine salicylate (ASPERCREME) 10 % cream Apply 1 application topically as needed for muscle pain.    . Rivaroxaban 15 & 20 MG TBPK Take as directed on package: Start with  one 73m tablet by mouth twice a day with food. On Day 22, switch to one 222mtablet once a day with food. 51 each 0   No current facility-administered medications for this visit.    Facility-Administered Medications Ordered in Other Visits  Medication Dose Route Frequency Provider Last Rate Last Dose  . iopamidol (ISOVUE-300) 61 % injection             REVIEW OF SYSTEMS:  Constitutional: Denies fevers, chills or abnormal night sweats (+) fatigue  Eyes: Denies blurriness of vision, double vision or watery eyes Ears, nose, mouth, throat, and face: Denies mucositis or sore throat Respiratory: Denies cough, dyspnea or wheezes Cardiovascular: Denies palpitation, chest discomfort or lower extremity swelling Gastrointestinal:  Denies constipation, diarrhea, heartburn or change in bowel habits  Skin: Denies abnormal skin rashes   Lymphatics: Denies new lymphadenopathy or easy bruising Neurological:Denies numbness, tingling or new weaknesses Behavioral/Psych: Mood is stable, no new changes  All other systems were reviewed with the patient and are negative.  PHYSICAL EXAMINATION:  ECOG PERFORMANCE STATUS: 1 - Symptomatic but completely ambulatory  Vitals:   03/09/18 1539  BP: 130/76  Pulse: 92  Resp: 20  Temp: 98.5 F (36.9 C)  SpO2: 100%   Filed Weights   03/09/18 1539  Weight: 174 lb 8 oz (79.2 kg)    GENERAL:alert, no distress and comfortable SKIN: skin color, texture, turgor are normal, no rashes or significant lesions EYES: normal, conjunctiva are pink and non-injected, (+) mild scleral icterus OROPHARYNX:no exudate, no erythema and lips, buccal mucosa, and tongue normal  NECK: supple, thyroid normal size, non-tender, without nodularity LYMPH:  no palpable cervical, supraclavicular, axillary, or inguinal lymphadenopathy  LUNGS: clear to auscultation bilaterally with normal breathing effort HEART: regular rate & rhythm and no murmurs and no lower extremity  edema ABDOMEN:abdomen soft, non-tender and normal bowel sounds.  No hepatomegaly. Midline incision from umbilical that is healed well with minimal scar tissues.  Musculoskeletal:no cyanosis of digits and no clubbing  PSYCH: alert & oriented x 3 with fluent speech NEURO: no focal motor/sensory deficits  LABORATORY DATA:  I have reviewed the data as listed CBC Latest Ref Rng & Units 03/02/2018 02/23/2018 02/16/2018  WBC 3.9 - 10.3 K/uL 3.1(L) 3.9 6.4  Hemoglobin 11.6 - 15.9 g/dL 10.0(L) 10.9(L) 11.2(L)  Hematocrit 34.8 - 46.6 % 30.3(L) 34.0(L) 34.7(L)  Platelets 145 - 400 K/uL 168 240 318   CMP Latest Ref Rng & Units 03/02/2018 02/23/2018 02/16/2018  Glucose 70 - 140 mg/dL 109 136 114  BUN 7 - 26 mg/dL 8 7 8   Creatinine 0.60 - 1.10 mg/dL 0.74 0.81 0.89  Sodium 136 - 145 mmol/L 140 139 138  Potassium 3.5 - 5.1 mmol/L 2.7(LL) 3.3(L) 3.5  Chloride 98 - 109 mmol/L 108 110(H) 109  CO2 22 - 29 mmol/L 23 20(L) 22  Calcium 8.4 - 10.4 mg/dL 9.2 9.1 9.3  Total Protein 6.4 - 8.3 g/dL 7.3 7.1 7.3  Total Bilirubin 0.2 - 1.2 mg/dL 0.4 0.4 0.3  Alkaline Phos  40 - 150 U/L 128 140 147  AST 5 - 34 U/L 151(H) 133(H) 53(H)  ALT 0 - 55 U/L 89(H) 79(H) 42   PATHOLOGY Diagnosis 11/11/17 FINE NEEDLE ASPIRATION, ENDOSCOPIC, PANCREAS HEAD(SPECIMEN 1 OF 1 COLLECTED 11/11/17): ADENOCARCINOMA. Preliminary Diagnosis Intraoperative Diagnosis: SPINDLE CELL FRAGMENTS AND RARE ATYPICAL EPITHELIAL CELLS.(JDP)  Whipple Procedure, 01/05/2018 1. Gallbladder, and lymph node - CHRONIC CHOLECYSTITIS. - ONE BENIGN PERICYSTIC LYMPH NODE (0/1). - THERE IS NO EVIDENCE OF MALIGNANCY. 2. Lymph node, biopsy, Common Hepatic Artery - THERE IS NO EVIDENCE OF CARCINOMA IN 1 OF 1 LYMPH NODE (0/1). 3. Whipple procedure/resection - INVASIVE ADENOCARCINOMA, MODERATELY DIFFERENTIATED, SPANNING 4.0 CM. - PERINEURAL INVASION IS IDENTIFIED. - LYMPHOVASCULAR INVASION IS IDENTIFIED. - ADENOCARCINOMA IS PRESENT AT THE UNCINATE (RED) MARGIN. -  ADENOCARCINOMA EXTENDS INTO PERIPANCREATIC SOFT TISSUE. - METASTATIC CARCINOMA IN 5 OF 29 LYMPH NODES (5/29), WITH EXTRACAPSULAR EXTENSION. - SEE ONCOLOGY TABLE BELOW. 4. Lymph node, biopsy, Portal - THERE IS NO EVIDENCE OF CARCINOMA IN 1 OF 1 LYMPH NODE (0/1). 5. Lymph node, biopsy, Superior mesenteric artery - THERE IS NO EVIDENCE OF CARCINOMA IN 1 OF 1 LYMPH NODE (0/1). 6. Lymph node, biopsy, Ligament of Treitz - THERE IS NO EVIDENCE OF CARCINOMA IN 1 OF 1 LYMPH NODE (0/1).  RADIOGRAPHIC STUDIES: I have personally reviewed the radiological images as listed and agreed with the findings in the report. Ct Chest W Contrast  Result Date: 03/09/2018 CLINICAL DATA:  History of pancreatic cancer diagnosed 11/2017. Patient on chemotherapy. Patient status post Whipple procedure. Follow-up exam. EXAM: CT CHEST, ABDOMEN, AND PELVIS WITH CONTRAST TECHNIQUE: Multidetector CT imaging of the chest, abdomen and pelvis was performed following the standard protocol during bolus administration of intravenous contrast. CONTRAST:  136m ISOVUE-300 IOPAMIDOL (ISOVUE-300) INJECTION 61% COMPARISON:  CT abdomen pelvis 12/01/2017; CT chest 10/18/2017. FINDINGS: CT CHEST FINDINGS Cardiovascular: Heart is mildly enlarged. Coronary arterial vascular calcifications. Thoracic aortic vascular calcifications. Filling defects demonstrated within the peripheral right main pulmonary artery extending into the right upper, middle and proximal right lower lobe pulmonary arteries. No CT evidence to suggest acute right heart strain. Filling defect within the right internal jugular vein centrally (image 5; series 7). Mediastinum/Nodes: No enlarged axillary, mediastinal or hilar lymphadenopathy. Normal esophagus. Lungs/Pleura: Central airways are patent. Patchy ground-glass and consolidative opacities demonstrated within the right-greater-than-left lower lobes. No pleural effusion. New 2 mm right upper lobe nodule (image 36; series). No  pleural effusion or pneumothorax. Musculoskeletal: Thoracic spine degenerative changes. No aggressive or acute appearing osseous lesions. CT ABDOMEN PELVIS FINDINGS Hepatobiliary: Liver is extremely low in attenuation compatible with steatosis. Interval development of multiple peripherally enhancing lesions throughout the right and left hepatic lobe compatible with metastatic disease. Reference 2.0 x 1.8 cm lesion within the right hepatic lobe (image 40; series 2). Reference 1.4 x 1.0 cm lesion within the left hepatic lobe (image 16; series 2). Multiple additional lesions are demonstrated throughout the liver. Pneumobilia. No intrahepatic biliary ductal dilatation. Pancreas: Interval postsurgical changes compatible with Whipple procedure. Stent demonstrated within the pancreatic duct within the body and tail. Interval formation of pancreaticojejunostomy. There is a small 1.5 x 0.7 cm low-attenuation lesion at the surgical site (image 96; series 7). Spleen: Unremarkable Adrenals/Urinary Tract: Stable right adrenal gland adenoma measuring 1.3 cm. Normal left adrenal gland. Kidneys enhance symmetrically with contrast. Unchanged cyst interpolar region left kidney. No hydronephrosis. Urinary bladder is unremarkable. Stomach/Bowel: Oral contrast material to the level of the rectum. No evidence for small bowel obstruction. No free fluid  or free intraperitoneal air. Interval postprocedural changes compatible with Whipple procedure. There is mild wall thickening and surrounding fat stranding about the proximal duodenum, potentially postsurgical in etiology. Vascular/Lymphatic: Normal caliber abdominal aorta. Peripheral calcified atherosclerotic plaque. No retroperitoneal lymphadenopathy. Reproductive: Status posthysterectomy. Other: None. Musculoskeletal: Lumbar spine degenerative changes. No aggressive or acute appearing osseous lesions. IMPRESSION: 1. Acute pulmonary embolus demonstrated within the distal right main  pulmonary artery extending into the right upper, right middle and right lower lobes. 2. Filling defect within the central right internal jugular vein may represent nonocclusive thrombus. 3. Interval development of multiple enhancing lesions within the liver most compatible with hepatic metastatic disease. 4. Interval Whipple procedure. Small cystic lesion at the site of the pancreatic duodenal anastomosis, potentially postprocedural in etiology. Recommend attention on follow-up. 5. Wall thickening and surrounding fat stranding proximal duodenum, potentially postprocedural in etiology. Recommend attention on follow-up. 6. Critical Value/emergent results were called by telephone at the time of interpretation on 03/09/2018 at 2:49 pm to Dr. Truitt Merle , who verbally acknowledged these results. Electronically Signed   By: Lovey Newcomer M.D.   On: 03/09/2018 14:50   Ct Abdomen Pelvis W Contrast  Result Date: 03/09/2018 CLINICAL DATA:  History of pancreatic cancer diagnosed 11/2017. Patient on chemotherapy. Patient status post Whipple procedure. Follow-up exam. EXAM: CT CHEST, ABDOMEN, AND PELVIS WITH CONTRAST TECHNIQUE: Multidetector CT imaging of the chest, abdomen and pelvis was performed following the standard protocol during bolus administration of intravenous contrast. CONTRAST:  150m ISOVUE-300 IOPAMIDOL (ISOVUE-300) INJECTION 61% COMPARISON:  CT abdomen pelvis 12/01/2017; CT chest 10/18/2017. FINDINGS: CT CHEST FINDINGS Cardiovascular: Heart is mildly enlarged. Coronary arterial vascular calcifications. Thoracic aortic vascular calcifications. Filling defects demonstrated within the peripheral right main pulmonary artery extending into the right upper, middle and proximal right lower lobe pulmonary arteries. No CT evidence to suggest acute right heart strain. Filling defect within the right internal jugular vein centrally (image 5; series 7). Mediastinum/Nodes: No enlarged axillary, mediastinal or hilar  lymphadenopathy. Normal esophagus. Lungs/Pleura: Central airways are patent. Patchy ground-glass and consolidative opacities demonstrated within the right-greater-than-left lower lobes. No pleural effusion. New 2 mm right upper lobe nodule (image 36; series). No pleural effusion or pneumothorax. Musculoskeletal: Thoracic spine degenerative changes. No aggressive or acute appearing osseous lesions. CT ABDOMEN PELVIS FINDINGS Hepatobiliary: Liver is extremely low in attenuation compatible with steatosis. Interval development of multiple peripherally enhancing lesions throughout the right and left hepatic lobe compatible with metastatic disease. Reference 2.0 x 1.8 cm lesion within the right hepatic lobe (image 40; series 2). Reference 1.4 x 1.0 cm lesion within the left hepatic lobe (image 16; series 2). Multiple additional lesions are demonstrated throughout the liver. Pneumobilia. No intrahepatic biliary ductal dilatation. Pancreas: Interval postsurgical changes compatible with Whipple procedure. Stent demonstrated within the pancreatic duct within the body and tail. Interval formation of pancreaticojejunostomy. There is a small 1.5 x 0.7 cm low-attenuation lesion at the surgical site (image 96; series 7). Spleen: Unremarkable Adrenals/Urinary Tract: Stable right adrenal gland adenoma measuring 1.3 cm. Normal left adrenal gland. Kidneys enhance symmetrically with contrast. Unchanged cyst interpolar region left kidney. No hydronephrosis. Urinary bladder is unremarkable. Stomach/Bowel: Oral contrast material to the level of the rectum. No evidence for small bowel obstruction. No free fluid or free intraperitoneal air. Interval postprocedural changes compatible with Whipple procedure. There is mild wall thickening and surrounding fat stranding about the proximal duodenum, potentially postsurgical in etiology. Vascular/Lymphatic: Normal caliber abdominal aorta. Peripheral calcified atherosclerotic plaque. No  retroperitoneal lymphadenopathy.  Reproductive: Status posthysterectomy. Other: None. Musculoskeletal: Lumbar spine degenerative changes. No aggressive or acute appearing osseous lesions. IMPRESSION: 1. Acute pulmonary embolus demonstrated within the distal right main pulmonary artery extending into the right upper, right middle and right lower lobes. 2. Filling defect within the central right internal jugular vein may represent nonocclusive thrombus. 3. Interval development of multiple enhancing lesions within the liver most compatible with hepatic metastatic disease. 4. Interval Whipple procedure. Small cystic lesion at the site of the pancreatic duodenal anastomosis, potentially postprocedural in etiology. Recommend attention on follow-up. 5. Wall thickening and surrounding fat stranding proximal duodenum, potentially postprocedural in etiology. Recommend attention on follow-up. 6. Critical Value/emergent results were called by telephone at the time of interpretation on 03/09/2018 at 2:49 pm to Dr. Truitt Merle , who verbally acknowledged these results. Electronically Signed   By: Lovey Newcomer M.D.   On: 03/09/2018 14:50    ASSESSMENT & PLAN: Sharren Schnurr is a pleasant 82 y.o. AAF who presented with syncopal event and obstructive juandice found to have malignant pancreatic head mass.   1. 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 She has what appears to be localized adenocarcinoma on pancreatic head.  -She underwent Whipple surgery, and tolerated surgery well.  She has recovered well from surgery, except not gaining weight back. -I discussed the recent pathology results with the patient and her family member today (02/09/2018).  She has locally advanced disease with 5+ lymph nodes, and positive margin at the uncinate -It  is extremely high risk of recurrence, I recommend patient to start adjuvant gemcitabine. -Patient started adjuvant chemotherapy gemcitabine on 02/16/18, tolerated the first cycle well, without significant side effects. -Due to the elevated CA 19.9 tumor marker on 02/16/2018 with results of 198, I advised and recommended that we follow up with a CT scan to rule out recurrence. -We discussed her CT CAP from today 03/09/18 which showed PE in right pulmonary artery spanning to smaller blood vessels. Scan also showed growth in her liver lesions compatible with metastatic disease.  -Due to her advanced age, newly diagnosed PE which requires anticoagulation, and typical image findings and adequate to see 19.9, I think her metastatic disease is pretty certain, we will not pursue biopsy to confirm. -With metastatic disease, her cancer is no longer curable and surgery and targeted radiation are not option for now.  -I discussed teatment options, mainly chemotherapy.  She is tolerating single agent Gemzar well, I recommend adding Abraxane to her gemcitabine for 2 weeks on 1 week off to increase effectiveness.  We discussed the other option of FOLFIRINOX, due to her advanced age, I do not think she is a candidate for such intensive chemotherapy --Chemotherapy consent: Side effects including but does not not limited to, fatigue, nausea, vomiting, diarrhea, hair loss, neuropathy, fluid retention, renal and kidney dysfunction, neutropenic fever, needed for blood transfusion, bleeding, were discussed with patient in great detail. She agrees to proceed. -I encouraged her to watch for development of neuropathy, will watch for this.  -I will do a MSI on her original surgical sample to see if she is eligible for immunotherapy. -Given her family history of breast cancer, I will send genetic referral to check for hereditary gene mutations. She is agreeable.  -Will scan again 2 months into treatment and monitor her tumor marker.    -Plan to  start in Gemcitabine/Abraxane next week.  -F/u in 1 week.    2. PE in right pulmonary artery extended to smaller blood vessels  -seen on 03/09/18 CT scan, incidental finding, patient is not symptomatic.  No signs of right heart strain on CT. -03/09/18 O2 sat 100% after walking with nurse 3-4 minutes, her other VS are all stable.  I did not think she needs to be admitted to hospital, will treat as outpatient. -She has no swelling of the legs, no clinical sign of DVT.  -I discussed treatment includes coumadin, Lovenox injections,  Xarelto once daily or Eliquis BID.  Benefit and the potential side effects, and the logistics were discussed with patient, she voiced good understanding. -She opted for Xarelto once daily starting 03/09/18. Will start her at 69m BID for 3 weeks and then 261monce daily.  I called into her pharmacy today, she will start tonight.  I gave her a coupon, she will get first month supply free. -Will monitor her closely. If she develops worsening symptoms she should present to the ED or notify the clinic.      3. Weight loss, decreased appetite, fatigue  -She has 13 lbs weight loss in 2 months, since symptom onset.  -Patient has lost 16 lbs since her surgery, I previously encouraged her to increase her nutrition supplement -Patient to follow up with nutritionist, on 02/15/2018. -Overall improved  4. HTN, hyperlipidemia -With PCP -We will monitor her blood pressure closely during her chemotherapy, and adjust her medication if needed  5.  Transaminitis -She has elevated ALT and AST, slightly worse today, total bilirubin normal, possible related to chemotherapy -Continue observation and follow-up closely -If get worse, will probably reduce her gemcitabine dose or hold it next week.  6. Social Support -Patient lives alone, her niece and nephew will bring her to the office visit and treatment, and support her at home. -Social worker referral  7. Goal of care  discussion  -We again discussed the incurable nature of his/her cancer, and the overall poor prognosis, especially if he/she does not have good response to chemotherapy or progress on chemo -The patient understands the goal of care is palliative. -she is full code now   PLAN: -Scan reviewed.   -I prescribed Xarelto to start today at 1537mid  -Send Genetic referral  -Lab, flush, F/u and Gem/Abraxane on 6/6    Orders Placed This Encounter  Procedures  . Ambulatory referral to Genetics    Referral Priority:   Urgent    Referral Type:   Consultation    Referral Reason:   Specialty Services Required    Number of Visits Requested:   1    All questions were answered. The patient knows to call the clinic with any problems, questions or concerns. I spent 30 minutes counseling the patient face to face. The total time spent in the appointment was 40 minutes and more than 50% was on counseling and review of test results.  I, Oneal Deputym acting as scribe for YanTruitt MerleD.   I have reviewed the above documentation for accuracy and completeness, and I agree with the above.     YanTruitt MerleD 03/09/2018

## 2018-03-10 ENCOUNTER — Telehealth: Payer: Self-pay | Admitting: Hematology

## 2018-03-10 NOTE — Telephone Encounter (Signed)
Genetics Appointment scheduled per 5/30 los & 5/31high priority sch msg

## 2018-03-16 ENCOUNTER — Inpatient Hospital Stay: Payer: PPO

## 2018-03-16 ENCOUNTER — Encounter: Payer: Self-pay | Admitting: Nurse Practitioner

## 2018-03-16 ENCOUNTER — Inpatient Hospital Stay: Payer: PPO | Attending: Nurse Practitioner

## 2018-03-16 ENCOUNTER — Telehealth: Payer: Self-pay | Admitting: Emergency Medicine

## 2018-03-16 ENCOUNTER — Other Ambulatory Visit: Payer: Self-pay | Admitting: Hematology

## 2018-03-16 ENCOUNTER — Inpatient Hospital Stay (HOSPITAL_BASED_OUTPATIENT_CLINIC_OR_DEPARTMENT_OTHER): Payer: PPO | Admitting: Nurse Practitioner

## 2018-03-16 VITALS — BP 124/73 | HR 73 | Temp 97.9°F | Resp 17 | Ht 67.0 in | Wt 169.7 lb

## 2018-03-16 DIAGNOSIS — R634 Abnormal weight loss: Secondary | ICD-10-CM

## 2018-03-16 DIAGNOSIS — C250 Malignant neoplasm of head of pancreas: Secondary | ICD-10-CM

## 2018-03-16 DIAGNOSIS — E876 Hypokalemia: Secondary | ICD-10-CM | POA: Insufficient documentation

## 2018-03-16 DIAGNOSIS — R7401 Elevation of levels of liver transaminase levels: Secondary | ICD-10-CM

## 2018-03-16 DIAGNOSIS — I2699 Other pulmonary embolism without acute cor pulmonale: Secondary | ICD-10-CM

## 2018-03-16 DIAGNOSIS — R197 Diarrhea, unspecified: Secondary | ICD-10-CM | POA: Diagnosis not present

## 2018-03-16 DIAGNOSIS — C787 Secondary malignant neoplasm of liver and intrahepatic bile duct: Secondary | ICD-10-CM | POA: Insufficient documentation

## 2018-03-16 DIAGNOSIS — Z5111 Encounter for antineoplastic chemotherapy: Secondary | ICD-10-CM | POA: Diagnosis not present

## 2018-03-16 DIAGNOSIS — I10 Essential (primary) hypertension: Secondary | ICD-10-CM | POA: Insufficient documentation

## 2018-03-16 DIAGNOSIS — R740 Nonspecific elevation of levels of transaminase and lactic acid dehydrogenase [LDH]: Secondary | ICD-10-CM | POA: Diagnosis not present

## 2018-03-16 LAB — CBC WITH DIFFERENTIAL (CANCER CENTER ONLY)
BASOS ABS: 0 10*3/uL (ref 0.0–0.1)
Basophils Relative: 0 %
Eosinophils Absolute: 0 10*3/uL (ref 0.0–0.5)
Eosinophils Relative: 0 %
HEMATOCRIT: 33 % — AB (ref 34.8–46.6)
HEMOGLOBIN: 10.7 g/dL — AB (ref 11.6–15.9)
LYMPHS PCT: 31 %
Lymphs Abs: 1.8 10*3/uL (ref 0.9–3.3)
MCH: 29.6 pg (ref 25.1–34.0)
MCHC: 32.4 g/dL (ref 31.5–36.0)
MCV: 91.4 fL (ref 79.5–101.0)
MONO ABS: 0.8 10*3/uL (ref 0.1–0.9)
Monocytes Relative: 14 %
NEUTROS ABS: 3.2 10*3/uL (ref 1.5–6.5)
NEUTROS PCT: 55 %
Platelet Count: 532 10*3/uL — ABNORMAL HIGH (ref 145–400)
RBC: 3.61 MIL/uL — AB (ref 3.70–5.45)
RDW: 17.4 % — AB (ref 11.2–14.5)
WBC: 5.9 10*3/uL (ref 3.9–10.3)

## 2018-03-16 LAB — CMP (CANCER CENTER ONLY)
ALT: 123 U/L — ABNORMAL HIGH (ref 0–55)
ANION GAP: 10 (ref 3–11)
AST: 182 U/L (ref 5–34)
Albumin: 3.5 g/dL (ref 3.5–5.0)
Alkaline Phosphatase: 185 U/L — ABNORMAL HIGH (ref 40–150)
BUN: 11 mg/dL (ref 7–26)
CO2: 19 mmol/L — ABNORMAL LOW (ref 22–29)
Calcium: 9.7 mg/dL (ref 8.4–10.4)
Chloride: 107 mmol/L (ref 98–109)
Creatinine: 0.96 mg/dL (ref 0.60–1.10)
GFR, Estimated: 54 mL/min — ABNORMAL LOW (ref >60)
Glucose, Bld: 114 mg/dL (ref 70–140)
POTASSIUM: 4.2 mmol/L (ref 3.5–5.1)
Sodium: 136 mmol/L (ref 136–145)
TOTAL PROTEIN: 7.9 g/dL (ref 6.4–8.3)
Total Bilirubin: 0.5 mg/dL (ref 0.2–1.2)

## 2018-03-16 MED ORDER — PACLITAXEL PROTEIN-BOUND CHEMO INJECTION 100 MG
100.0000 mg/m2 | Freq: Once | Status: AC
  Administered 2018-03-16: 200 mg via INTRAVENOUS
  Filled 2018-03-16: qty 40

## 2018-03-16 MED ORDER — SODIUM CHLORIDE 0.9 % IV SOLN
Freq: Once | INTRAVENOUS | Status: AC
  Administered 2018-03-16: 12:00:00 via INTRAVENOUS

## 2018-03-16 MED ORDER — SODIUM CHLORIDE 0.9 % IV SOLN
800.0000 mg/m2 | Freq: Once | INTRAVENOUS | Status: AC
  Administered 2018-03-16: 1558 mg via INTRAVENOUS
  Filled 2018-03-16: qty 40.98

## 2018-03-16 MED ORDER — PROCHLORPERAZINE MALEATE 10 MG PO TABS
ORAL_TABLET | ORAL | Status: AC
  Filled 2018-03-16: qty 1

## 2018-03-16 MED ORDER — PROCHLORPERAZINE MALEATE 10 MG PO TABS
10.0000 mg | ORAL_TABLET | Freq: Once | ORAL | Status: AC
  Administered 2018-03-16: 10 mg via ORAL

## 2018-03-16 NOTE — Telephone Encounter (Signed)
Critical result notification AST 182. NP made aware

## 2018-03-16 NOTE — Progress Notes (Signed)
03/16/18 @ 1110  Dose reduce Gemcitabine to 800 mg/m2 this day only will re-evaluate dose with next treatment.  Maintain Abraxane at current dose of 100 mg/m2 per treatment plan.  Treatment plan modified to reflect request.  T.O. Dr Burr Medico Regan Rakers NP/Krishay Faro Ronnald Ramp, PharmD

## 2018-03-16 NOTE — Patient Instructions (Signed)
[facility] Discharge Instructions for Patients Receiving Chemotherapy  Today you received the following chemotherapy agents Abraxane and Gemzar  To help prevent nausea and vomiting after your treatment, we encourage you to take your nausea medication as directed If you develop nausea and vomiting that is not controlled by your nausea medication, call the clinic.   BELOW ARE SYMPTOMS THAT SHOULD BE REPORTED IMMEDIATELY:  *FEVER GREATER THAN 100.5 F  *CHILLS WITH OR WITHOUT FEVER  NAUSEA AND VOMITING THAT IS NOT CONTROLLED WITH YOUR NAUSEA MEDICATION  *UNUSUAL SHORTNESS OF BREATH  *UNUSUAL BRUISING OR BLEEDING  TENDERNESS IN MOUTH AND THROAT WITH OR WITHOUT PRESENCE OF ULCERS  *URINARY PROBLEMS  *BOWEL PROBLEMS  UNUSUAL RASH Items with * indicate a potential emergency and should be followed up as soon as possible.  Feel free to call the clinic should you have any questions or concerns. The clinic phone number is [phone].  Please show the [place] at check-in to the Emergency Department and triage nurse.  Nanoparticle Albumin-Bound Paclitaxel (Abraxane) injection What is this medicine? NANOPARTICLE ALBUMIN-BOUND PACLITAXEL (Na no PAHR ti kuhl al BYOO muhn-bound PAK li TAX el) is a chemotherapy drug. It targets fast dividing cells, like cancer cells, and causes these cells to die. This medicine is used to treat advanced breast cancer and advanced lung cancer. This medicine may be used for other purposes; ask your health care provider or pharmacist if you have questions. COMMON BRAND NAME(S): Abraxane What should I tell my health care provider before I take this medicine? They need to know if you have any of these conditions: -kidney disease -liver disease -low blood counts, like low platelets, red blood cells, or white blood cells -recent or ongoing radiation therapy -an unusual or allergic reaction to paclitaxel, albumin, other chemotherapy,  other medicines, foods, dyes, or preservatives -pregnant or trying to get pregnant -breast-feeding How should I use this medicine? This drug is given as an infusion into a vein. It is administered in a hospital or clinic by a specially trained health care professional. Talk to your pediatrician regarding the use of this medicine in children. Special care may be needed. Overdosage: If you think you have taken too much of this medicine contact a poison control center or emergency room at once. NOTE: This medicine is only for you. Do not share this medicine with others. What if I miss a dose? It is important not to miss your dose. Call your doctor or health care professional if you are unable to keep an appointment. What may interact with this medicine? -cyclosporine -diazepam -ketoconazole -medicines to increase blood counts like filgrastim, pegfilgrastim, sargramostim -other chemotherapy drugs like cisplatin, doxorubicin, epirubicin, etoposide, teniposide, vincristine -quinidine -testosterone -vaccines -verapamil Talk to your doctor or health care professional before taking any of these medicines: -acetaminophen -aspirin -ibuprofen -ketoprofen -naproxen This list may not describe all possible interactions. Give your health care provider a list of all the medicines, herbs, non-prescription drugs, or dietary supplements you use. Also tell them if you smoke, drink alcohol, or use illegal drugs. Some items may interact with your medicine. What should I watch for while using this medicine? Your condition will be monitored carefully while you are receiving this medicine. You will need important blood work done while you are taking this medicine. This medicine can cause serious allergic reactions. If you experience allergic reactions like skin rash, itching or hives, swelling of the face, lips, or tongue, tell your doctor or health care  professional right away. In some cases, you may be given  additional medicines to help with side effects. Follow all directions for their use. This drug may make you feel generally unwell. This is not uncommon, as chemotherapy can affect healthy cells as well as cancer cells. Report any side effects. Continue your course of treatment even though you feel ill unless your doctor tells you to stop. Call your doctor or health care professional for advice if you get a fever, chills or sore throat, or other symptoms of a cold or flu. Do not treat yourself. This drug decreases your body's ability to fight infections. Try to avoid being around people who are sick. This medicine may increase your risk to bruise or bleed. Call your doctor or health care professional if you notice any unusual bleeding. Be careful brushing and flossing your teeth or using a toothpick because you may get an infection or bleed more easily. If you have any dental work done, tell your dentist you are receiving this medicine. Avoid taking products that contain aspirin, acetaminophen, ibuprofen, naproxen, or ketoprofen unless instructed by your doctor. These medicines may hide a fever. Do not become pregnant while taking this medicine. Women should inform their doctor if they wish to become pregnant or think they might be pregnant. There is a potential for serious side effects to an unborn child. Talk to your health care professional or pharmacist for more information. Do not breast-feed an infant while taking this medicine. Men are advised not to father a child while receiving this medicine. What side effects may I notice from receiving this medicine? Side effects that you should report to your doctor or health care professional as soon as possible: -allergic reactions like skin rash, itching or hives, swelling of the face, lips, or tongue -low blood counts - This drug may decrease the number of white blood cells, red blood cells and platelets. You may be at increased risk for infections and  bleeding. -signs of infection - fever or chills, cough, sore throat, pain or difficulty passing urine -signs of decreased platelets or bleeding - bruising, pinpoint red spots on the skin, black, tarry stools, nosebleeds -signs of decreased red blood cells - unusually weak or tired, fainting spells, lightheadedness -breathing problems -changes in vision -chest pain -high or low blood pressure -mouth sores -nausea and vomiting -pain, swelling, redness or irritation at the injection site -pain, tingling, numbness in the hands or feet -slow or irregular heartbeat -swelling of the ankle, feet, hands Side effects that usually do not require medical attention (report to your doctor or health care professional if they continue or are bothersome): -aches, pains -changes in the color of fingernails -diarrhea -hair loss -loss of appetite This list may not describe all possible side effects. Call your doctor for medical advice about side effects. You may report side effects to FDA at 1-800-FDA-1088. Where should I keep my medicine? This drug is given in a hospital or clinic and will not be stored at home. NOTE: This sheet is a summary. It may not cover all possible information. If you have questions about this medicine, talk to your doctor, pharmacist, or health care provider.  2018 [employer]vier/Gold Standard (2015-07-30 10:05:20)

## 2018-03-16 NOTE — Progress Notes (Signed)
Per Regan Rakers, NP okay to treat pt with AST of 182 and ALT 123

## 2018-03-16 NOTE — Progress Notes (Signed)
Lauren Richard  [phone]) [phone] Fax:(336) [phone]  Clinic Follow up Note   Patient Care Team: Glendale Chard, MD as PCP - General (Internal Medicine) 03/16/2018  SUMMARY OF ONCOLOGIC HISTORY: Oncology History   Cancer Staging Adenocarcinoma of head of pancreas [facility]) Staging form: Exocrine Pancreas, AJCC 8th Edition - Clinical: Stage IB (cT2, cN0, cM0) - Unsigned - Pathologic stage from 01/05/2018: Stage III (pT3, pN2, cM0) - Signed by Truitt Merle, MD on 02/10/2018       Adenocarcinoma of head of pancreas ([place])   11/03/2017 Imaging    CT AP WO CONTRAST IMPRESSION: 1. Findings are concerning for common bile duct obstruction, potentially related to a primary pancreatic neoplasm, as detailed above. At this time this is associated with moderate to severe intra and extrahepatic biliary ductal dilatation. Further evaluation with MRI of the abdomen with and without IV gadolinium with MRCP should be considered in the near future to better evaluate these findings. Alternatively, if the patient is not likely to be able to adequately hold her breath during an MRI examination, pancreatic protocol CT scan would be recommended. 2. Biliary sludge in the gallbladder. No findings to suggest an acute cholecystitis at this time. 3. Small right adrenal adenoma incidentally noted. 4. Aortic atherosclerosis, in addition to 3 vessel coronary artery disease. Assessment for potential risk factor modification, dietary therapy or pharmacologic therapy may be warranted, if clinically indicated.  Aortic Atherosclerosis (ICD10-I70.0).       11/04/2017 Procedure    ERCP per Dr. Benson Norway - The major papilla appeared normal. - A segmental biliary stricture was found. The stricture was malignant appearing. - The entire biliary tree was severely dilated, with a mass causing an obstruction. - A biliary sphincterotomy was performed. - One covered metal stent was placed into the common bile  duct.      11/04/2017 Imaging    FINDINGS: Imaging demonstrates cannulation of the common bile duct. The common bile duct is dilated. The inferior common bile duct is narrowed. Final image demonstrates placement of a metallic stent across the area of narrowing into the duodenum.  IMPRESSION: Common bile duct stent placement.      11/11/2017 Procedure    EUS per Dr. Benson Norway Endosonographic Finding Findings: A round mass was identified in the pancreatic head. The mass was hypoechoic. The mass measured 30 mm by 22 mm in maximal cross-sectional diameter. The outer margins were irregular. Fine needle aspiration for cytology was performed. Color Doppler imaging was utilized prior to needle puncture to confirm a lack of significant vascular structures within the needle path. Five passes were made with the 25 gauge needle using a transduodenal approach. A stylet was used. A cytotechnologist was present to evaluate the adequacy of the specimen. The cellularity of the specimen was adequate. Final cytology results are pending. The procedure was technically difficult as the metallic stent obscured views of the lesion. It was also very difficult to obtain adequate views for FNA, however, adequate samples were obtained.      11/11/2017 Pathology Results    Diagnosis FINE NEEDLE ASPIRATION, ENDOSCOPIC, PANCREAS HEAD(SPECIMEN 1 OF 1 COLLECTED 11/11/17): ADENOCARCINOMA. Preliminary Diagnosis Intraoperative Diagnosis: SPINDLE CELL FRAGMENTS AND RARE ATYPICAL EPITHELIAL CELLS.(JDP)      11/24/2017 Initial Diagnosis    Adenocarcinoma of head of pancreas (HCC)      12/01/2017 Imaging    CT Abdomen W Contrast 12/01/17 IMPRESSION: 1. 21 x 20 mm pancreatic head/uncinate process lesion without obvious involvement/invasion of the major surrounding vascular structures. 2.  Common bile duct stent in place with associated pneumobilia but decompression of the biliary tree. 3. Borderline enlarged celiac axis  and periportal lymph nodes. 4. No findings to suggest hepatic metastatic disease. 5. Advanced aortic and branch vessel calcifications but no aneurysm or dissection. 6. Patchy right basilar atelectasis.      01/05/2018 Surgery    LAPAROSCOPY DIAGNOSTIC ERAS PATHWAY and WHIPPLE PROCEDURE by Dr. Barry Dienes and Dr. Johney Maine 01/05/18      01/05/2018 Pathology Results    Diagnosis 01/05/18 1. Gallbladder, and lymph node - CHRONIC CHOLECYSTITIS. - ONE BENIGN PERICYSTIC LYMPH NODE (0/1). - THERE IS NO EVIDENCE OF MALIGNANCY. 2. Lymph node, biopsy, Common Hepatic Artery - THERE IS NO EVIDENCE OF CARCINOMA IN 1 OF 1 LYMPH NODE (0/1). 3. Whipple procedure/resection - INVASIVE ADENOCARCINOMA, MODERATELY DIFFERENTIATED, SPANNING 4.0 CM. - PERINEURAL INVASION IS IDENTIFIED. - LYMPHOVASCULAR INVASION IS IDENTIFIED. - ADENOCARCINOMA IS PRESENT AT THE UNCINATE (RED) MARGIN. - ADENOCARCINOMA EXTENDS INTO PERIPANCREATIC SOFT TISSUE. - METASTATIC CARCINOMA IN 5 OF 29 LYMPH NODES (5/29), WITH EXTRACAPSULAR EXTENSION. - SEE ONCOLOGY TABLE BELOW. 4. Lymph node, biopsy, Portal - THERE IS NO EVIDENCE OF CARCINOMA IN 1 OF 1 LYMPH NODE (0/1). 5. Lymph node, biopsy, Superior mesenteric artery - THERE IS NO EVIDENCE OF CARCINOMA IN 1 OF 1 LYMPH NODE (0/1). 6. Lymph node, biopsy, Ligament of Treitz - THERE IS NO EVIDENCE OF CARCINOMA IN 1 OF 1 LYMPH NODE (0/1)      01/05/2018 - 01/14/2018 Hospital Admission    Admit date: 01/05/18 Admission diagnosis: Fever from infection Additional comments: According to Discharge summary Pt was admitted to the ICU following and she had an epidural for pain control and low dose fentanyl PCA. Her NGT was able to be pulled on POD 2.5.  She had her foley pulled initially while epidural was still in and this had to be reinserted. She developed a fever, but UA was positive.  Once this was treated, she defervesced.  Culture did not grow anything. Her catheter was pulled after epidural was  removed and she did not have any issues.  She had an initial setback when she went from clears to fulls the first time, but n/v resolved.  Her PCA was d/c'd.  She was OOB and worked well with PT.  She had some initial electrolyte abnormalities that resolved.  Her drains were removed without issue.  She had a central line removed without incident.         01/05/2018 Pathology Results    Gallbladder, and lymph node with chronic cholecystitis. One benign pericystic lymph node (0/1). There is no evidence of malignancy. Lymph node, biopsy, Common Hepatic Artery with no evidence of carcinoma in 1 of 1 lymph nodes (0/1). Whipple procedure/resection with invasive adenocarcinoma, moderately differentiated, spanning 4.0 cm. Perineural invasion is identified. Lymphovascular invasion is identified. Adenocarcinoma is present at the uncinate (red) margin. Adenocarcinoma extends into peripancreatic soft tissue. metastatic carcinoma in 5 of 29 lymph nodes (5/29), with extracapsular extension. Lymph node, biopsy, Portal with no evidence of carcinoma in 1 of 1 lymph node (0/1). Lymph node, biopsy, Superior mesenteric artery with no evidence of carcinoma in 1 of 1 lymph node (0/1). Lymph node, biopsy, Ligament of Treitz with no evidence of carcinoma in 1 of 1 lymph node (0/1).        01/05/2018 Cancer Staging    Staging form: Exocrine Pancreas, AJCC 8th Edition - Pathologic stage from 01/05/2018: Stage III (pT3, pN2, cM0) - Signed by Truitt Merle, MD on  02/10/2018      02/16/2018 -  Chemotherapy    Gemcitabine 3 weeks on and 1 week off for 6 months starting on 02/16/18      03/09/2018 Imaging    CT CAP W contrast 03/09/18  IMPRESSION: 1. Acute pulmonary embolus demonstrated within the distal right main pulmonary artery extending into the right upper, right middle and right lower lobes. 2. Filling defect within the central right internal jugular vein may represent nonocclusive thrombus. 3. Interval development of multiple  enhancing lesions within the liver most compatible with hepatic metastatic disease. 4. Interval Whipple procedure. Small cystic lesion at the site of the pancreatic duodenal anastomosis, potentially postprocedural in etiology. Recommend attention on follow-up. 5. Wall thickening and surrounding fat stranding proximal duodenum, potentially postprocedural in etiology. Recommend attention on follow-up. 6. Critical Value/emergent results were called by telephone at the time of interpretation on 03/09/2018 at 2:49 pm to Dr. Truitt Merle , who verbally acknowledged these results.       03/15/2018 -  Chemotherapy    The patient had gemcitabine (GEMZAR) 1,558 mg in sodium chloride 0.9 % 100 mL chemo infusion, 800 mg/m2 = 1,558 mg (80 % of original dose 1,000 mg/m2), Intravenous,  Once, 1 of 4 cycles Dose modification: 800 mg/m2 (original dose 1,000 mg/m2, Cycle 1, Reason: Provider Judgment) PACLitaxel-protein bound (ABRAXANE) chemo infusion 200 mg, 100 mg/m2 = 200 mg (100 % of original dose 100 mg/m2), Intravenous, Once, 1 of 4 cycles Dose modification: 100 mg/m2 (original dose 100 mg/m2, Cycle 1, Reason: Provider Judgment, Comment: advanced age )  for chemotherapy treatment.      CURRENT THERAPY:  Gemcitabine 3 weeks and 1 week off starting 02/16/18. Due to cancer recurrence, will add Abraxane to Gemcitabine and switch to 2 weeks on and 1 weeks off starting 03/16/18    INTERVAL HISTORY: Ms. Beilfuss returns for follow up and cycle 1 gem/abraxane. She presents with her sister and niece. She continues to report low energy and activity level but notes she is active at home. She uses a walker. Appetite is fair, she has been seen by dietician. Does not like boost. She vomited this morning after taking potassium pill. Had mild diarrhea recently that lasted 2 days then resolved. Abdomen is occasionally sore. She is compliant with xarelto, denies bleeding. No cough, chest pain, dyspnea, or hemoptysis. Denies  neuropathy.   REVIEW OF SYSTEMS:   Constitutional: Denies fevers, chills or abnormal weight loss (+) low appetite (+) low energy/activity  Eyes: Denies blurriness of vision Ears, nose, mouth, throat, and face: Denies mucositis or sore throat Respiratory: Denies cough, dyspnea or wheezes Cardiovascular: Denies palpitation, chest discomfort or lower extremity swelling Gastrointestinal:  Denies nausea, constipation, heartburn or change in bowel habits (+) vomiting x1 after potassium pill this AM (+) periodic diarrhea (+) intermittent abdominal soreness  Skin: Denies abnormal skin rashes Lymphatics: Denies new lymphadenopathy or easy bruising Neurological:Denies numbness, tingling or new weaknesses Behavioral/Psych: Mood is stable, no new changes  All other systems were reviewed with the patient and are negative.  MEDICAL HISTORY:  Past Medical History:  Diagnosis Date  . Adenocarcinoma of head of pancreas ([place]) 11/24/2017  . Arthritis    "all over" (04/26/2017); knees, shoulder  . Essential hypertension 03/03/2017  . History of blood transfusion 1979   "when I had my hysterectomy"  . Hyperlipidemia   . Hypothyroidism   . Obesity 03/03/2017  . Osteoarthritis 03/03/2017  . Pneumonia    as a child    SURGICAL  HISTORY: Past Surgical History:  Procedure Laterality Date  . BREAST CYST EXCISION Right    "cut 6 cysts out"  . CYST EXCISION Left    "off my toe"  . DILATION AND CURETTAGE OF UTERUS     "before hysterectomy"  . ERCP N/A 11/04/2017   Procedure: ENDOSCOPIC RETROGRADE CHOLANGIOPANCREATOGRAPHY (ERCP);  Surgeon: Carol Ada, MD;  Location: [facility];  Service: Endoscopy;  Laterality: N/A;  . EUS N/A 11/11/2017   Procedure: UPPER ENDOSCOPIC ULTRASOUND (EUS) LINEAR;  Surgeon: Carol Ada, MD;  Location: [facility];  Service: Endoscopy;  Laterality: N/A;  . FINE NEEDLE ASPIRATION N/A 11/11/2017   Procedure: FINE NEEDLE ASPIRATION (FNA) LINEAR;  Surgeon: Carol Ada, MD;   Location: [facility];  Service: Endoscopy;  Laterality: N/A;  . INGUINAL HERNIA REPAIR Right   . JOINT REPLACEMENT    . LAPAROSCOPY N/A 01/05/2018   Procedure: LAPAROSCOPY DIAGNOSTIC ERAS PATHWAY;  Surgeon: Stark Klein, MD;  Location: [facility];  Service: General;  Laterality: N/A;  [place]  . REDUCTION MAMMAPLASTY Bilateral 1980  . TONSILLECTOMY     "when I small"  . TOTAL ABDOMINAL HYSTERECTOMY  1979  . TOTAL KNEE ARTHROPLASTY Right 04/26/2017  . TOTAL KNEE ARTHROPLASTY Left 04/26/2017   Procedure: LEFT TOTAL KNEE ARTHROPLASTY;  Surgeon: [name], MD;  Location: [facility];  Service: Orthopedics;  Laterality: Left;  . WHIPPLE PROCEDURE N/A 01/05/2018   Procedure: WHIPPLE PROCEDURE;  Surgeon: Stark Klein, MD;  Location: [facility];  Service: General;  Laterality: N/A;    I have reviewed the social history and family history with the patient and they are unchanged from previous note.  ALLERGIES:  has No Known Allergies.  MEDICATIONS:  Current Outpatient Medications  Medication Sig Dispense Refill  . acetaminophen (TYLENOL) 500 MG tablet Take 500-1,000 mg by mouth 2 (two) times daily as needed for moderate pain or headache.     . Amino Acids-Protein Hydrolys (FEEDING SUPPLEMENT, PRO-STAT SUGAR FREE 64,) LIQD Take 30 mLs by mouth 2 (two) times daily. 900 mL 0  . amLODipine (NORVASC) 5 MG tablet Take 5 mg by mouth daily.     Marland Kitchen aspirin 81 MG tablet Take 81 mg by mouth daily.    Marland Kitchen atenolol (TENORMIN) 25 MG tablet Take 1 tablet (25 mg total) by mouth daily. 30 tablet 0  . Cholecalciferol (VITAMIN D) 2000 units tablet Take 2,000 Units by mouth daily.    Marland Kitchen gabapentin (NEURONTIN) 100 MG capsule Take 2 capsules (200 mg total) by mouth 2 (two) times daily. 30 capsule 0  . magnesium oxide (MAG-OX) 400 (241.3 Mg) MG tablet Take 1 tablet (400 mg total) by mouth 2 (two) times daily. 60 tablet 0  . pantoprazole (PROTONIX) 40 MG tablet Take 1 tablet (40 mg total) by mouth daily at 12 noon. 30 tablet 0    . potassium chloride SA (K-DUR,KLOR-CON) 20 MEQ tablet Take 2 tablets (40 mEq total) by mouth daily. 30 tablet 0  . PRESCRIPTION MEDICATION Apply 1 application topically 4 (four) times daily as needed (pain). Dic 3%, Bac 2%, lid - pril 2-5%, Menth 1% cream    . Rivaroxaban 15 & 20 MG TBPK Take as directed on package: Start with one 33m tablet by mouth twice a day with food. On Day 22, switch to one 21mtablet once a day with food. 51 each 0  . trolamine salicylate (ASPERCREME) 10 % cream Apply 1 application topically as needed for muscle pain.     No current facility-administered medications  for this visit.    Facility-Administered Medications Ordered in Other Visits  Medication Dose Route Frequency Provider Last Rate Last Dose  . gemcitabine (GEMZAR) 1,558 mg in sodium chloride 0.9 % 250 mL chemo infusion  800 mg/m2 (Treatment Plan Recorded) Intravenous Once Truitt Merle, MD 582 mL/hr at 03/16/18 1327 1,558 mg at 03/16/18 1327    PHYSICAL EXAMINATION: ECOG PERFORMANCE STATUS: 1-2   Vitals:   03/16/18 1009  BP: 124/73  Pulse: 73  Resp: 17  Temp: 97.9 F (36.6 C)  SpO2: 98%   Filed Weights   03/16/18 1009  Weight: 169 lb 11.2 oz (77 kg)    GENERAL:alert, no distress and comfortable SKIN: no rashes or significant lesions EYES: normal, Conjunctiva are pink and non-injected, sclera clear OROPHARYNX:no thrush or ulcers  LYMPH:  no palpable cervical or supraclavicular lymphadenopathy  LUNGS: clear to auscultation with normal breathing effort HEART: regular rate & rhythm and no murmurs , trace bilateral lower extremity edema ABDOMEN:abdomen soft, non-tender and normal bowel sounds. Midline abdominal incision is well healed. No hepatomegaly.  Musculoskeletal:no cyanosis of digits and no clubbing  NEURO: alert & oriented x 3 with fluent speech, no focal motor/sensory deficits  LABORATORY DATA:  I have reviewed the data as listed CBC Latest Ref Rng & Units 03/16/2018 03/02/2018  02/23/2018  WBC 3.9 - 10.3 K/uL 5.9 3.1(L) 3.9  Hemoglobin 11.6 - 15.9 g/dL 10.7(L) 10.0(L) 10.9(L)  Hematocrit 34.8 - 46.6 % 33.0(L) 30.3(L) 34.0(L)  Platelets 145 - 400 K/uL 532(H) 168 240     CMP Latest Ref Rng & Units 03/16/2018 03/02/2018 02/23/2018  Glucose 70 - 140 mg/dL 114 109 136  BUN 7 - 26 mg/dL _0 Creatinine 0.60 - 1.10 mg/dL 0.96 0.74 0.81  Sodium 136 - 145 mmol/L 136 140 139  Potassium 3.5 - 5.1 mmol/L 4.2 2.7(LL) 3.3(L)  Chloride 98 - 109 mmol/L 107 108 110(H)  CO2 22 - 29 mmol/L 19(L) 23 20(L)  Calcium 8.4 - 10.4 mg/dL 9.7 9.2 9.1  Total Protein 6.4 - 8.3 g/dL 7.9 7.3 7.1  Total Bilirubin 0.2 - 1.2 mg/dL 0.5 0.4 0.4  Alkaline Phos 40 - 150 U/L 185(H) 128 140  AST 5 - 34 U/L 182(HH) 151(H) 133(H)  ALT 0 - 55 U/L 123(H) 89(H) 79(H)   PATHOLOGY Diagnosis 11/11/17 FINE NEEDLE ASPIRATION, ENDOSCOPIC, PANCREAS HEAD(SPECIMEN 1 OF 1 COLLECTED 11/11/17): ADENOCARCINOMA. Preliminary Diagnosis Intraoperative Diagnosis: SPINDLE CELL FRAGMENTS AND RARE ATYPICAL EPITHELIAL CELLS.(JDP)  Whipple Procedure, 01/05/2018 1. Gallbladder, and lymph node - CHRONIC CHOLECYSTITIS. - ONE BENIGN PERICYSTIC LYMPH NODE (0/1). - THERE IS NO EVIDENCE OF MALIGNANCY. 2. Lymph node, biopsy, Common Hepatic Artery - THERE IS NO EVIDENCE OF CARCINOMA IN 1 OF 1 LYMPH NODE (0/1). 3. Whipple procedure/resection - INVASIVE ADENOCARCINOMA, MODERATELY DIFFERENTIATED, SPANNING 4.0 CM. - PERINEURAL INVASION IS IDENTIFIED. - LYMPHOVASCULAR INVASION IS IDENTIFIED. - ADENOCARCINOMA IS PRESENT AT THE UNCINATE (RED) MARGIN. - ADENOCARCINOMA EXTENDS INTO PERIPANCREATIC SOFT TISSUE. - METASTATIC CARCINOMA IN 5 OF 29 LYMPH NODES (5/29), WITH EXTRACAPSULAR EXTENSION. - SEE ONCOLOGY TABLE BELOW. 4. Lymph node, biopsy, Portal - THERE IS NO EVIDENCE OF CARCINOMA IN 1 OF 1 LYMPH NODE (0/1). 5. Lymph node, biopsy, Superior mesenteric artery - THERE IS NO EVIDENCE OF CARCINOMA IN 1 OF 1 LYMPH NODE (0/1). 6. Lymph  node, biopsy, Ligament of Treitz - THERE IS NO EVIDENCE OF CARCINOMA IN 1 OF 1 LYMPH NODE (0/1).     RADIOGRAPHIC STUDIES: I have personally reviewed the radiological images  as listed and agreed with the findings in the report. No results found.   ASSESSMENT & PLAN: Lauren Richard is a pleasant 82 y.o. AAF who presented with syncopal event and obstructive juandice found to have malignant pancreatic head mass.   1. Adenocarcinoma of pancreatic head metastatic to the liver, pT3N2M0, stage IV, G2, with positive margin, liver recurrence in 02/2018 -Ms. Devin appears stable. She continues to have low appetite and activity level. We discussed gradually increasing activity to help with her fatigue. I encouraged her to eat well rounded diet and add nutritional supplement daily.  -Labs reviewed, adequate to proceed with cycle 1 day 1 gem/abraxane. Transaminitis is increased, normal bili; I discussed with pharmacy. Will dose reduce gemzar to 800 mg/m2, abraxane to be given at 100 mg/m2 as previously ordered today for cycle 1 day 1, will reevaluate on day 8 and adjust dose as necessary. OK to proceed with treatment.  -Has been taking oral K BID for hypokalemia, K is normal today; I recommend she continue oral potassium, can take 1 tab daily.  -I reviewed diarrhea management to prevent further hypokalemia. -The patient and family are interested in [facility]. I reviewed risks and benefits. I will discuss with Dr. Burr Medico and Dr. Barry Dienes as she is recently on anticoagulation for PE. -Genetics appointment on 03/23/18 -return in 1 week for C1D8, then 1 week off  2. PE in right pulmonary artery extended to smaller blood vessels  -Compliant with Xarelto  3. Weight loss, decreased appetite, fatigue -She continues to lose weight steadily. She does not like boost. I encouraged her to try ensure, glucerna, or carnation breakfast. I reviewed these are not meal replacements, but supplements. I urged her to eat well rounded  diet with high calorie and to eat small amounts frequently. She will f/u with dietician next visit.   4. HTN, HL -BP has been well controlled lately, will continue to monitor   5. Transaminitis  -Have been trending up lately, AST 182, ALT 123, ALK PHOS 185 today. Bili is normal. Likely related to liver metastasis and chemo. Will dose reduce gem/abraxane today. I discussed with pharmacy. I encouraged her to avoid tylenol. Will monitor closely.   6. Social support  -She has good family support, she presents with sister and niece today  59. Goals of care discussion - full code   PLAN: -Labs reviewed, OK to proceed with cycle 1 day 1 gem/abraxane; will dose reduce gemcitabine to 800 mg/m2, abraxane to be given at 100 mg/m2 as previously ordered for cycle 1 day 1; will reevaluate on day 8 and monitor labs -continue oral K, 1 tab daily; I refilled  -Avoid tylenol and liver-toxic agents  -Increase po intake, well rounded diet, high calorie, and add nutrition supplement  -Genetics appointment 6/13 -Return in 1 week for f/u and cycle 1 day 8 -Discuss PAC with Dr. Burr Medico and Dr. Barry Dienes   All questions were answered. The patient knows to call the clinic with any problems, questions or concerns. No barriers to learning was detected. I spent 20 minutes counseling the patient face to face. The total time spent in the appointment was 25 minutes and more than 50% was on counseling and review of test results     Alla Feeling, NP 03/16/18

## 2018-03-17 LAB — CANCER ANTIGEN 19-9: CAN 19-9: 126 U/mL — AB (ref 0–35)

## 2018-03-20 ENCOUNTER — Other Ambulatory Visit: Payer: Self-pay | Admitting: General Surgery

## 2018-03-21 ENCOUNTER — Other Ambulatory Visit: Payer: Self-pay | Admitting: Nurse Practitioner

## 2018-03-21 DIAGNOSIS — R630 Anorexia: Secondary | ICD-10-CM

## 2018-03-21 MED ORDER — MIRTAZAPINE 7.5 MG PO TABS: 7.5000 mg | ORAL_TABLET | Freq: Every day | ORAL | 1 refills | Status: DC

## 2018-03-21 NOTE — Progress Notes (Signed)
I called patient to discuss recommendations for [facility] placement per Dr. Burr Medico and Dr. Barry Dienes. She notes the surgery center called her earlier with appt on 6/26/ for [facility] placement. She is compliant with xarelto daily as prescribed. She has no other concerns today other than low appetite and taste change. She is requesting appetite stimulant. I suggest Remeron for her appetite and also to help with sleep, as she wakes up early every morning around 4 AM. I reviewed potential side effects. Patient agrees to try. I sent Rx to her pharmacy today.

## 2018-03-23 ENCOUNTER — Inpatient Hospital Stay (HOSPITAL_BASED_OUTPATIENT_CLINIC_OR_DEPARTMENT_OTHER): Payer: PPO | Admitting: Genetics

## 2018-03-23 ENCOUNTER — Inpatient Hospital Stay: Payer: PPO

## 2018-03-23 ENCOUNTER — Telehealth: Payer: Self-pay | Admitting: Nurse Practitioner

## 2018-03-23 ENCOUNTER — Inpatient Hospital Stay (HOSPITAL_BASED_OUTPATIENT_CLINIC_OR_DEPARTMENT_OTHER): Payer: PPO | Admitting: Nurse Practitioner

## 2018-03-23 ENCOUNTER — Encounter: Payer: Self-pay | Admitting: Genetics

## 2018-03-23 ENCOUNTER — Telehealth: Payer: Self-pay | Admitting: *Deleted

## 2018-03-23 ENCOUNTER — Encounter: Payer: Self-pay | Admitting: Nurse Practitioner

## 2018-03-23 VITALS — BP 132/70 | HR 79 | Temp 98.1°F | Resp 18 | Ht 67.0 in | Wt 168.3 lb

## 2018-03-23 DIAGNOSIS — I2699 Other pulmonary embolism without acute cor pulmonale: Secondary | ICD-10-CM | POA: Diagnosis not present

## 2018-03-23 DIAGNOSIS — C250 Malignant neoplasm of head of pancreas: Secondary | ICD-10-CM | POA: Diagnosis not present

## 2018-03-23 DIAGNOSIS — R740 Nonspecific elevation of levels of transaminase and lactic acid dehydrogenase [LDH]: Secondary | ICD-10-CM

## 2018-03-23 DIAGNOSIS — Z803 Family history of malignant neoplasm of breast: Secondary | ICD-10-CM

## 2018-03-23 DIAGNOSIS — C787 Secondary malignant neoplasm of liver and intrahepatic bile duct: Secondary | ICD-10-CM | POA: Diagnosis not present

## 2018-03-23 DIAGNOSIS — B370 Candidal stomatitis: Secondary | ICD-10-CM

## 2018-03-23 DIAGNOSIS — D709 Neutropenia, unspecified: Secondary | ICD-10-CM

## 2018-03-23 DIAGNOSIS — T451X5A Adverse effect of antineoplastic and immunosuppressive drugs, initial encounter: Secondary | ICD-10-CM

## 2018-03-23 DIAGNOSIS — B379 Candidiasis, unspecified: Secondary | ICD-10-CM

## 2018-03-23 DIAGNOSIS — Z8042 Family history of malignant neoplasm of prostate: Secondary | ICD-10-CM

## 2018-03-23 DIAGNOSIS — Z7901 Long term (current) use of anticoagulants: Secondary | ICD-10-CM | POA: Diagnosis not present

## 2018-03-23 DIAGNOSIS — E876 Hypokalemia: Secondary | ICD-10-CM | POA: Diagnosis not present

## 2018-03-23 DIAGNOSIS — D649 Anemia, unspecified: Secondary | ICD-10-CM

## 2018-03-23 DIAGNOSIS — Z5111 Encounter for antineoplastic chemotherapy: Secondary | ICD-10-CM | POA: Diagnosis not present

## 2018-03-23 DIAGNOSIS — Z1379 Encounter for other screening for genetic and chromosomal anomalies: Secondary | ICD-10-CM

## 2018-03-23 DIAGNOSIS — D701 Agranulocytosis secondary to cancer chemotherapy: Secondary | ICD-10-CM

## 2018-03-23 DIAGNOSIS — Z800 Family history of malignant neoplasm of digestive organs: Secondary | ICD-10-CM

## 2018-03-23 DIAGNOSIS — Z8051 Family history of malignant neoplasm of kidney: Secondary | ICD-10-CM | POA: Diagnosis not present

## 2018-03-23 DIAGNOSIS — R7401 Elevation of levels of liver transaminase levels: Secondary | ICD-10-CM

## 2018-03-23 LAB — CMP (CANCER CENTER ONLY)
ALT: 110 U/L — AB (ref 0–55)
ANION GAP: 8 (ref 3–11)
AST: 217 U/L (ref 5–34)
Albumin: 3.1 g/dL — ABNORMAL LOW (ref 3.5–5.0)
Alkaline Phosphatase: 143 U/L (ref 40–150)
BUN: 8 mg/dL (ref 7–26)
CHLORIDE: 107 mmol/L (ref 98–109)
CO2: 24 mmol/L (ref 22–29)
Calcium: 9.6 mg/dL (ref 8.4–10.4)
Creatinine: 0.85 mg/dL (ref 0.60–1.10)
Glucose, Bld: 108 mg/dL (ref 70–140)
POTASSIUM: 3.4 mmol/L — AB (ref 3.5–5.1)
SODIUM: 139 mmol/L (ref 136–145)
Total Bilirubin: 0.5 mg/dL (ref 0.2–1.2)
Total Protein: 7.6 g/dL (ref 6.4–8.3)

## 2018-03-23 LAB — CBC WITH DIFFERENTIAL (CANCER CENTER ONLY)
Basophils Absolute: 0 10*3/uL (ref 0.0–0.1)
Basophils Relative: 0 %
EOS ABS: 0 10*3/uL (ref 0.0–0.5)
Eosinophils Relative: 1 %
HEMATOCRIT: 31.3 % — AB (ref 34.8–46.6)
HEMOGLOBIN: 10.2 g/dL — AB (ref 11.6–15.9)
LYMPHS ABS: 1.7 10*3/uL (ref 0.9–3.3)
LYMPHS PCT: 61 %
MCH: 30 pg (ref 25.1–34.0)
MCHC: 32.6 g/dL (ref 31.5–36.0)
MCV: 92.1 fL (ref 79.5–101.0)
MONOS PCT: 6 %
Monocytes Absolute: 0.2 10*3/uL (ref 0.1–0.9)
NEUTROS ABS: 0.9 10*3/uL — AB (ref 1.5–6.5)
NEUTROS PCT: 32 %
Platelet Count: 522 10*3/uL — ABNORMAL HIGH (ref 145–400)
RBC: 3.4 MIL/uL — ABNORMAL LOW (ref 3.70–5.45)
RDW: 17.4 % — ABNORMAL HIGH (ref 11.2–14.5)
WBC: 2.7 10*3/uL — AB (ref 3.9–10.3)

## 2018-03-23 MED ORDER — SODIUM CHLORIDE 0.9 % IV SOLN
800.0000 mg/m2 | Freq: Once | INTRAVENOUS | Status: AC
  Administered 2018-03-23: 1558 mg via INTRAVENOUS
  Filled 2018-03-23: qty 40.98

## 2018-03-23 MED ORDER — SODIUM CHLORIDE 0.9 % IV SOLN
Freq: Once | INTRAVENOUS | Status: AC
  Administered 2018-03-23: 12:00:00 via INTRAVENOUS

## 2018-03-23 MED ORDER — HEPARIN SOD (PORK) LOCK FLUSH 100 UNIT/ML IV SOLN
500.0000 [IU] | Freq: Once | INTRAVENOUS | Status: DC | PRN
  Filled 2018-03-23: qty 5

## 2018-03-23 MED ORDER — NYSTATIN 100000 UNIT/ML MT SUSP: 5.0000 mL | Freq: Four times a day (QID) | OROMUCOSAL | 0 refills | Status: DC

## 2018-03-23 MED ORDER — PROCHLORPERAZINE MALEATE 10 MG PO TABS
ORAL_TABLET | ORAL | Status: AC
  Filled 2018-03-23: qty 1

## 2018-03-23 MED ORDER — PACLITAXEL PROTEIN-BOUND CHEMO INJECTION 100 MG
100.0000 mg/m2 | Freq: Once | INTRAVENOUS | Status: AC
  Administered 2018-03-23: 200 mg via INTRAVENOUS
  Filled 2018-03-23: qty 40

## 2018-03-23 MED ORDER — PROCHLORPERAZINE MALEATE 10 MG PO TABS
10.0000 mg | ORAL_TABLET | Freq: Once | ORAL | Status: AC
  Administered 2018-03-23: 10 mg via ORAL

## 2018-03-23 MED ORDER — SODIUM CHLORIDE 0.9% FLUSH
10.0000 mL | INTRAVENOUS | Status: DC | PRN
  Filled 2018-03-23: qty 10

## 2018-03-23 NOTE — Progress Notes (Addendum)
REFERRING PROVIDER: Truitt Merle, MD [address] [place], [place] [address]  PRIMARY PROVIDER:  Glendale Chard, MD  PRIMARY REASON FOR VISIT:  1. Adenocarcinoma of head of pancreas ([place])   2. Family history of pancreatic cancer   3. Family history of breast cancer   4. Family history of prostate cancer     HISTORY OF PRESENT ILLNESS:   Lauren Richard, a 82 y.o. female, was seen for a [place] [facility] cancer genetics consultation at the request of Dr. Burr Medico due to a personal and family history of cancer.  Lauren Richard presents to clinic today to discuss the possibility of a hereditary predisposition to cancer, genetic testing, and to further clarify her future cancer risks, as well as potential cancer risks for family members.   In Feb 2019, at the age of 36, biopsy revealed adenocarcinoma of the pancreas.  It is now metastatic.  She had a whipple procedure on 01/05/2018. She is currently undergoing chemotherapy.      CANCER HISTORY:  Oncology History   Cancer Staging Adenocarcinoma of head of pancreas [facility]) Staging form: Exocrine Pancreas, AJCC 8th Edition - Clinical: Stage IB (cT2, cN0, cM0) - Unsigned - Pathologic stage from 01/05/2018: Stage III (pT3, pN2, cM0) - Signed by Truitt Merle, MD on 02/10/2018       Adenocarcinoma of head of pancreas ([place])   11/03/2017 Imaging    CT AP WO CONTRAST IMPRESSION: 1. Findings are concerning for common bile duct obstruction, potentially related to a primary pancreatic neoplasm, as detailed above. At this time this is associated with moderate to severe intra and extrahepatic biliary ductal dilatation. Further evaluation with MRI of the abdomen with and without IV gadolinium with MRCP should be considered in the near future to better evaluate these findings. Alternatively, if the patient is not likely to be able to adequately hold her breath during an MRI examination, pancreatic protocol CT scan would be recommended. 2. Biliary sludge in the  gallbladder. No findings to suggest an acute cholecystitis at this time. 3. Small right adrenal adenoma incidentally noted. 4. Aortic atherosclerosis, in addition to 3 vessel coronary artery disease. Assessment for potential risk factor modification, dietary therapy or pharmacologic therapy may be warranted, if clinically indicated.  Aortic Atherosclerosis (ICD10-I70.0).       11/04/2017 Procedure    ERCP per Dr. Benson Norway - The major papilla appeared normal. - A segmental biliary stricture was found. The stricture was malignant appearing. - The entire biliary tree was severely dilated, with a mass causing an obstruction. - A biliary sphincterotomy was performed. - One covered metal stent was placed into the common bile duct.      11/04/2017 Imaging    FINDINGS: Imaging demonstrates cannulation of the common bile duct. The common bile duct is dilated. The inferior common bile duct is narrowed. Final image demonstrates placement of a metallic stent across the area of narrowing into the duodenum.  IMPRESSION: Common bile duct stent placement.      11/11/2017 Procedure    EUS per Dr. Benson Norway Endosonographic Finding Findings: A round mass was identified in the pancreatic head. The mass was hypoechoic. The mass measured 30 mm by 22 mm in maximal cross-sectional diameter. The outer margins were irregular. Fine needle aspiration for cytology was performed. Color Doppler imaging was utilized prior to needle puncture to confirm a lack of significant vascular structures within the needle path. Five passes were made with the 25 gauge needle using a transduodenal approach. A stylet was used. A  cytotechnologist was present to evaluate the adequacy of the specimen. The cellularity of the specimen was adequate. Final cytology results are pending. The procedure was technically difficult as the metallic stent obscured views of the lesion. It was also very difficult to obtain adequate views for FNA,  however, adequate samples were obtained.      11/11/2017 Pathology Results    Diagnosis FINE NEEDLE ASPIRATION, ENDOSCOPIC, PANCREAS HEAD(SPECIMEN 1 OF 1 COLLECTED 11/11/17): ADENOCARCINOMA. Preliminary Diagnosis Intraoperative Diagnosis: SPINDLE CELL FRAGMENTS AND RARE ATYPICAL EPITHELIAL CELLS.(JDP)      11/24/2017 Initial Diagnosis    Adenocarcinoma of head of pancreas ([facility])      12/01/2017 Imaging    CT Abdomen W Contrast 12/01/17 IMPRESSION: 1. 21 x 20 mm pancreatic head/uncinate process lesion without obvious involvement/invasion of the major surrounding vascular structures. 2. Common bile duct stent in place with associated pneumobilia but decompression of the biliary tree. 3. Borderline enlarged celiac axis and periportal lymph nodes. 4. No findings to suggest hepatic metastatic disease. 5. Advanced aortic and branch vessel calcifications but no aneurysm or dissection. 6. Patchy right basilar atelectasis.      01/05/2018 Surgery    LAPAROSCOPY DIAGNOSTIC ERAS PATHWAY and WHIPPLE PROCEDURE by Dr. Barry Dienes and Dr. Johney Maine 01/05/18      01/05/2018 Pathology Results    Diagnosis 01/05/18 1. Gallbladder, and lymph node - CHRONIC CHOLECYSTITIS. - ONE BENIGN PERICYSTIC LYMPH NODE (0/1). - THERE IS NO EVIDENCE OF MALIGNANCY. 2. Lymph node, biopsy, Common Hepatic Artery - THERE IS NO EVIDENCE OF CARCINOMA IN 1 OF 1 LYMPH NODE (0/1). 3. Whipple procedure/resection - INVASIVE ADENOCARCINOMA, MODERATELY DIFFERENTIATED, SPANNING 4.0 CM. - PERINEURAL INVASION IS IDENTIFIED. - LYMPHOVASCULAR INVASION IS IDENTIFIED. - ADENOCARCINOMA IS PRESENT AT THE UNCINATE (RED) MARGIN. - ADENOCARCINOMA EXTENDS INTO PERIPANCREATIC SOFT TISSUE. - METASTATIC CARCINOMA IN 5 OF 29 LYMPH NODES (5/29), WITH EXTRACAPSULAR EXTENSION. - SEE ONCOLOGY TABLE BELOW. 4. Lymph node, biopsy, Portal - THERE IS NO EVIDENCE OF CARCINOMA IN 1 OF 1 LYMPH NODE (0/1). 5. Lymph node, biopsy, Superior mesenteric artery -  THERE IS NO EVIDENCE OF CARCINOMA IN 1 OF 1 LYMPH NODE (0/1). 6. Lymph node, biopsy, Ligament of Treitz - THERE IS NO EVIDENCE OF CARCINOMA IN 1 OF 1 LYMPH NODE (0/1)      01/05/2018 - 01/14/2018 Hospital Admission    Admit date: 01/05/18 Admission diagnosis: Fever from infection Additional comments: According to Discharge summary Pt was admitted to the ICU following and she had an epidural for pain control and low dose fentanyl PCA. Her NGT was able to be pulled on POD 2.5.  She had her foley pulled initially while epidural was still in and this had to be reinserted. She developed a fever, but UA was positive.  Once this was treated, she defervesced.  Culture did not grow anything. Her catheter was pulled after epidural was removed and she did not have any issues.  She had an initial setback when she went from clears to fulls the first time, but n/v resolved.  Her PCA was d/c'd.  She was OOB and worked well with PT.  She had some initial electrolyte abnormalities that resolved.  Her drains were removed without issue.  She had a central line removed without incident.         01/05/2018 Pathology Results    Gallbladder, and lymph node with chronic cholecystitis. One benign pericystic lymph node (0/1). There is no evidence of malignancy. Lymph node, biopsy, Common Hepatic Artery with no evidence of carcinoma  in 1 of 1 lymph nodes (0/1). Whipple procedure/resection with invasive adenocarcinoma, moderately differentiated, spanning 4.0 cm. Perineural invasion is identified. Lymphovascular invasion is identified. Adenocarcinoma is present at the uncinate (red) margin. Adenocarcinoma extends into peripancreatic soft tissue. metastatic carcinoma in 5 of 29 lymph nodes (5/29), with extracapsular extension. Lymph node, biopsy, Portal with no evidence of carcinoma in 1 of 1 lymph node (0/1). Lymph node, biopsy, Superior mesenteric artery with no evidence of carcinoma in 1 of 1 lymph node (0/1). Lymph node, biopsy,  Ligament of Treitz with no evidence of carcinoma in 1 of 1 lymph node (0/1).        01/05/2018 Cancer Staging    Staging form: Exocrine Pancreas, AJCC 8th Edition - Pathologic stage from 01/05/2018: Stage III (pT3, pN2, cM0) - Signed by Truitt Merle, MD on 02/10/2018      02/16/2018 -  Chemotherapy    Gemcitabine 3 weeks on and 1 week off for 6 months starting on 02/16/18      03/09/2018 Imaging    CT CAP W contrast 03/09/18  IMPRESSION: 1. Acute pulmonary embolus demonstrated within the distal right main pulmonary artery extending into the right upper, right middle and right lower lobes. 2. Filling defect within the central right internal jugular vein may represent nonocclusive thrombus. 3. Interval development of multiple enhancing lesions within the liver most compatible with hepatic metastatic disease. 4. Interval Whipple procedure. Small cystic lesion at the site of the pancreatic duodenal anastomosis, potentially postprocedural in etiology. Recommend attention on follow-up. 5. Wall thickening and surrounding fat stranding proximal duodenum, potentially postprocedural in etiology. Recommend attention on follow-up. 6. Critical Value/emergent results were called by telephone at the time of interpretation on 03/09/2018 at 2:49 pm to Dr. Truitt Merle , who verbally acknowledged these results.       03/15/2018 -  Chemotherapy    The patient had gemcitabine (GEMZAR) 1,558 mg in sodium chloride 0.9 % 250 mL chemo infusion, 800 mg/m2 = 1,558 mg (80 % of original dose 1,000 mg/m2), Intravenous,  Once, 1 of 4 cycles Dose modification: 800 mg/m2 (original dose 1,000 mg/m2, Cycle 1, Reason: Provider Judgment) Administration: 1,558 mg (03/16/2018) PACLitaxel-protein bound (ABRAXANE) chemo infusion 200 mg, 100 mg/m2 = 200 mg (100 % of original dose 100 mg/m2), Intravenous, Once, 1 of 4 cycles Dose modification: 100 mg/m2 (original dose 100 mg/m2, Cycle 1, Reason: Provider Judgment, Comment: advanced age  ) Administration: 200 mg (03/16/2018)  for chemotherapy treatment.         HORMONAL RISK FACTORS:  First live birth at age N/A.  Ovaries intact: no.  Hysterectomy: yes.  Menopausal status: postmenopausal.  Colonoscopy: yes; reportedly 'no major problems' was told she does not need to have any more. Number of breast biopsies: She has had several brest cysts.   Past Medical History:  Diagnosis Date  . Adenocarcinoma of head of pancreas ([place]) 11/24/2017  . Arthritis    "all over" (04/26/2017); knees, shoulder  . Essential hypertension 03/03/2017  . Family history of breast cancer   . Family history of kidney cancer   . Family history of pancreatic cancer   . Family history of prostate cancer   . History of blood transfusion 1979   "when I had my hysterectomy"  . Hyperlipidemia   . Hypothyroidism   . Obesity 03/03/2017  . Osteoarthritis 03/03/2017  . Pneumonia    as a child    Past Surgical History:  Procedure Laterality Date  . BREAST CYST EXCISION Right    "  cut 6 cysts out"  . CYST EXCISION Left    "off my toe"  . DILATION AND CURETTAGE OF UTERUS     "before hysterectomy"  . ERCP N/A 11/04/2017   Procedure: ENDOSCOPIC RETROGRADE CHOLANGIOPANCREATOGRAPHY (ERCP);  Surgeon: Carol Ada, MD;  Location: [place];  Service: Endoscopy;  Laterality: N/A;  . EUS N/A 11/11/2017   Procedure: UPPER ENDOSCOPIC ULTRASOUND (EUS) LINEAR;  Surgeon: Carol Ada, MD;  Location: [facility];  Service: Endoscopy;  Laterality: N/A;  . FINE NEEDLE ASPIRATION N/A 11/11/2017   Procedure: FINE NEEDLE ASPIRATION (FNA) LINEAR;  Surgeon: Carol Ada, MD;  Location: [facility];  Service: Endoscopy;  Laterality: N/A;  . INGUINAL HERNIA REPAIR Right   . JOINT REPLACEMENT    . LAPAROSCOPY N/A 01/05/2018   Procedure: LAPAROSCOPY DIAGNOSTIC ERAS PATHWAY;  Surgeon: Stark Klein, MD;  Location: [place] [facility];  Service: General;  Laterality: N/A;  [place]  . REDUCTION MAMMAPLASTY Bilateral 1980  .  TONSILLECTOMY     "when I small"  . TOTAL ABDOMINAL HYSTERECTOMY  1979  . TOTAL KNEE ARTHROPLASTY Right 04/26/2017  . TOTAL KNEE ARTHROPLASTY Left 04/26/2017   Procedure: LEFT TOTAL KNEE ARTHROPLASTY;  Surgeon: [name], MD;  Location: [facility];  Service: Orthopedics;  Laterality: Left;  . WHIPPLE PROCEDURE N/A 01/05/2018   Procedure: WHIPPLE PROCEDURE;  Surgeon: Stark Klein, MD;  Location: [facility];  Service: General;  Laterality: N/A;    Social History   Socioeconomic History  . Marital status: Widowed    Spouse name: Not on file  . Number of children: Not on file  . Years of education: Not on file  . Highest education level: Not on file  Occupational History  . Not on file  Social Needs  . Financial resource strain: Not on file  . Food insecurity:    Worry: Not on file    Inability: Not on file  . Transportation needs:    Medical: Not on file    Non-medical: Not on file  Tobacco Use  . Smoking status: Never Smoker  . Smokeless tobacco: Never Used  Substance and Sexual Activity  . Alcohol use: No    Alcohol/week: 0.0 oz    Frequency: Never    Comment: occasional glass of wine   . Drug use: No  . Sexual activity: Never  Lifestyle  . Physical activity:    Days per week: Not on file    Minutes per session: Not on file  . Stress: Not on file  Relationships  . Social connections:    Talks on phone: Not on file    Gets together: Not on file    Attends religious service: Not on file    Active member of club or organization: Not on file    Attends meetings of clubs or organizations: Not on file    Relationship status: Not on file  Other Topics Concern  . Not on file  Social History Narrative  . Not on file     FAMILY HISTORY:  We obtained a detailed, 4-generation family history.  Significant diagnoses are listed below: Family History  Problem Relation Age of Onset  . Kidney cancer Mother 34  . Lung cancer Father 70       lung, smoker  . Congestive Heart  Failure Sister   . Asthma Sister   . Atrial fibrillation Sister   . Hypertension Sister   . Diabetes Sister   . Head & neck cancer Paternal Grandmother   . Cancer Maternal  Uncle        type unk  . Pancreatic cancer Paternal Aunt        dx >50  . Cancer Paternal Uncle        type unk  . Breast cancer Cousin 6  . Prostate cancer Other 62       had robotic surgery   Lauren Richard has no children.  Lauren Richard has 4 sisters.  1 is a fraternal twin sister with no history of cancer.  Her other 3 sisters are alive and in their 50's and 6's with no history of cancer.  Lauren Richard has a nephew who was diagnosed with prostate cancer at 74 and had robotic surgery to remove his prostate.   Lauren Richard father: died at 24 due to lung cancer.  Paternal aunts/Uncles: 2 paternal uncles 4 paternal aunts.  1 paternal uncle had cancer, type unk. 1 paternal aunt died of pancreatic cancer> 43.  Paternal cousins: 1 paternal cousin was diagnosed with breast cancer in her late 43's.   Paternal grandfather: died in old age, unk cause Paternal grandmother:died in old age, unk cause  Lauren Richard mother: died at 52 and had renal cancer in her 16's.  Maternal Aunts/Uncles: 5 maternal aunts, 3 maternal uncles.  1 maternal uncle had cancer- type unk.  Maternal cousins: no reported history of cancer.  Maternal grandfather: unk Maternal grandmother: Had head and neck cancer, had to have shoulder/arm removed.   Ms. Sanjose is unaware of previous family history of genetic testing for hereditary cancer risks. Patient's maternal ancestors are of African American descent, and paternal ancestors are of African American/Caucasian descent. There is no reported Ashkenazi Jewish ancestry. There is no known consanguinity.  GENETIC COUNSELING ASSESSMENT: Lauren Richard is a 82 y.o. female with a personal and family history which is somewhat suggestive of a Hereditary Cancer Predisposition Syndrome. We, therefore, discussed and recommended the  following at today's visit.   DISCUSSION: We reviewed the characteristics, features and inheritance patterns of hereditary cancer syndromes. We also discussed genetic testing, including the appropriate family members to test, the process of testing, insurance coverage and turn-around-time for results. We discussed the implications of a negative, positive and/or variant of uncertain significant result. We recommended Lauren Richard pursue genetic testing for the Common Hereditary Cancers gene panel + Renal/urinary tract cancers panel.    The Common Hereditary Cancer Panel offered by [facility] includes sequencing and/or deletion duplication testing of the following 47 genes: APC, ATM, AXIN2, BARD1, BMPR1A, BRCA1, BRCA2, BRIP1, CDH1, CDKN2A (p14ARF), CDKN2A (p16INK4a), CKD4, CHEK2, CTNNA1, DICER1, EPCAM (Deletion/duplication testing only), GREM1 (promoter region deletion/duplication testing only), KIT, MEN1, MLH1, MSH2, MSH3, MSH6, MUTYH, NBN, NF1, NHTL1, PALB2, PDGFRA, PMS2, POLD1, POLE, PTEN, RAD50, RAD51C, RAD51D, SDHB, SDHC, SDHD, SMAD4, SMARCA4. STK11, TP53, TSC1, TSC2, and VHL.  The following genes were evaluated for sequence changes only: SDHA and HOXB13 c.251G>A variant only.  [facility] Renal/Urinary Tract Cancers Panel: BAP1, CDC73, CDKN1C, DICER1, DIS3L2, EPCAM, FH, FLCN, GPC3, MET, MLH1, MSH2, MSH6, PMS2, PTEN, SDHB, SDHC,  SMARCA4, SMARCB1, TP53, TSC1, TSC2, VHL, WT1.   We discussed that only 5-10% of cancers are associated with a Hereditary cancer predisposition syndrome.  One of the most common hereditary cancer syndromes that increases pancreatic cancer risk is called Hereditary Breast and Ovarian Cancer (HBOC) syndrome.  This syndrome is caused by mutations in the BRCA1 and BRCA2 genes.  This syndrome increases an individual's lifetime risk to develop breast, ovarian, prostate, and other types of cancer.  There are also many other cancer predisposition syndromes caused by mutations in several other  genes.  We discussed that if she is found to have a mutation in one of these genes, it may impact surgical decisions, and alter future medical management recommendations such as increased cancer screenings and consideration of risk reducing surgeries.  A positive result could also have implications for the patient's family members.  In some cases, a positive result may also impact treatment decision (ex PARP inhibitor therapy).   A Negative result would mean we were unable to identify a hereditary component to her cancer, but does not rule out the possibility of a hereditary basis for her cancer.  There could be mutations that are undetectable by current technology, or in genes not yet tested or identified to increase cancer risk.    We discussed the potential to find a Variant of Uncertain Significance or VUS.  These are variants that have not yet been identified as pathogenic or benign, and it is unknown if this variant is associated with increased cancer risk or if this is a normal finding.  Most VUS's are reclassified to benign or likely benign.   It should not be used to make medical management decisions. With time, we suspect the lab will determine the significance of any VUS's identified if any.   Based on Lauren Richard personal and family history of cancer, she meets medical criteria for genetic testing. Despite that she meets criteria, she may still have an out of pocket cost.   PLAN: After considering the risks, benefits, and limitations, Lauren Richard  provided informed consent to pursue genetic testing and the blood sample was sent to [facility] for analysis of the Common Hereditary Cancers Panel + Renal/Urinary Tract Cancers Panel. Results should be available within approximately 2-3 weeks' time, at which point they will be disclosed by telephone to Lauren Richard, as will any additional recommendations warranted by these results. Lauren Richard will receive a summary of her genetic counseling visit and  a copy of her results once available. This information will also be available in Epic. We encouraged Lauren Richard to remain in contact with cancer genetics annually so that we can continuously update the family history and inform her of any changes in cancer genetics and testing that may be of benefit for her family. Lauren Richard questions were answered to her satisfaction today. Our contact information was provided should additional questions or concerns arise.  Based on Lauren Richard family history, we recommended her paternal relatives, have genetic counseling and testing. Lauren Richard will let us know if we can be of any assistance in coordinating genetic counseling and/or testing for this family member.   Lastly, we encouraged Lauren Richard to remain in contact with cancer genetics annually so that we can continuously update the family history and inform her of any changes in cancer genetics and testing that may be of benefit for this family.   Ms.  Simonich questions were answered to her satisfaction today. Our contact information was provided should additional questions or concerns arise. Thank you for the referral and allowing [place] to share in the care of your patient.   Tana Felts, MS, [facility] Certified Genetic Counselor [email or web address] phone: [phone]  The patient was seen for a total of 30 minutes in face-to-face genetic counseling.  The patient was accompanied today by her nephew.  This patient was discussed with Drs. Magrinat, Lindi Adie and/or Burr Medico who agrees with the above.

## 2018-03-23 NOTE — Progress Notes (Signed)
Per Regan Rakers, NP okay to treat pt with anc of 0.9, AST of 217. And ALT of 110

## 2018-03-23 NOTE — Patient Instructions (Signed)
[facility] Discharge Instructions for Patients Receiving Chemotherapy  Today you received the following chemotherapy agents: Abraxane and Gemzar   To help prevent nausea and vomiting after your treatment, we encourage you to take your nausea medication as directed.    If you develop nausea and vomiting that is not controlled by your nausea medication, call the clinic.   BELOW ARE SYMPTOMS THAT SHOULD BE REPORTED IMMEDIATELY:  *FEVER GREATER THAN 100.5 F  *CHILLS WITH OR WITHOUT FEVER  NAUSEA AND VOMITING THAT IS NOT CONTROLLED WITH YOUR NAUSEA MEDICATION  *UNUSUAL SHORTNESS OF BREATH  *UNUSUAL BRUISING OR BLEEDING  TENDERNESS IN MOUTH AND THROAT WITH OR WITHOUT PRESENCE OF ULCERS  *URINARY PROBLEMS  *BOWEL PROBLEMS  UNUSUAL RASH Items with * indicate a potential emergency and should be followed up as soon as possible.  Feel free to call the clinic should you have any questions or concerns. The clinic phone number is [phone].  Please show the CHEMO ALERT CARD at check-in to the Emergency Department and triage nurse.   

## 2018-03-23 NOTE — Telephone Encounter (Signed)
"  Someone called me while I was there.  I saw Dietitian, N.P, so I don't believe it was them.  You don't have to transfer me to doctor's nurse, thanks but I'll check my voicemail."  No call documentation noted at this time.   Advised return call before 4:30 pm if needed.

## 2018-03-23 NOTE — Telephone Encounter (Signed)
Appointments scheduled/ In basket message to Lauren Richard to add port flush appt to 7/5 / per 6/13 los

## 2018-03-23 NOTE — Progress Notes (Signed)
[place]  [phone] Fax:([phone]) [phone]  Clinic Follow up Note   Patient Care Team: Glendale Chard, MD as PCP - General (Internal Medicine) 03/23/2018  SUMMARY OF ONCOLOGIC HISTORY: Oncology History   Cancer Staging Adenocarcinoma of head of pancreas [facility]) Staging form: Exocrine Pancreas, AJCC 8th Edition - Clinical: Stage IB (cT2, cN0, cM0) - Unsigned - Pathologic stage from 01/05/2018: Stage III (pT3, pN2, cM0) - Signed by Truitt Merle, MD on 02/10/2018       Adenocarcinoma of head of pancreas ([place])   11/03/2017 Imaging    CT AP WO CONTRAST IMPRESSION: 1. Findings are concerning for common bile duct obstruction, potentially related to a primary pancreatic neoplasm, as detailed above. At this time this is associated with moderate to severe intra and extrahepatic biliary ductal dilatation. Further evaluation with MRI of the abdomen with and without IV gadolinium with MRCP should be considered in the near future to better evaluate these findings. Alternatively, if the patient is not likely to be able to adequately hold her breath during an MRI examination, pancreatic protocol CT scan would be recommended. 2. Biliary sludge in the gallbladder. No findings to suggest an acute cholecystitis at this time. 3. Small right adrenal adenoma incidentally noted. 4. Aortic atherosclerosis, in addition to 3 vessel coronary artery disease. Assessment for potential risk factor modification, dietary therapy or pharmacologic therapy may be warranted, if clinically indicated.  Aortic Atherosclerosis (ICD10-I70.0).       11/04/2017 Procedure    ERCP per Dr. Benson Norway - The major papilla appeared normal. - A segmental biliary stricture was found. The stricture was malignant appearing. - The entire biliary tree was severely dilated, with a mass causing an obstruction. - A biliary sphincterotomy was performed. - One covered metal stent was placed into the common bile  duct.      11/04/2017 Imaging    FINDINGS: Imaging demonstrates cannulation of the common bile duct. The common bile duct is dilated. The inferior common bile duct is narrowed. Final image demonstrates placement of a metallic stent across the area of narrowing into the duodenum.  IMPRESSION: Common bile duct stent placement.      11/11/2017 Procedure    EUS per Dr. Benson Norway Endosonographic Finding Findings: A round mass was identified in the pancreatic head. The mass was hypoechoic. The mass measured 30 mm by 22 mm in maximal cross-sectional diameter. The outer margins were irregular. Fine needle aspiration for cytology was performed. Color Doppler imaging was utilized prior to needle puncture to confirm a lack of significant vascular structures within the needle path. Five passes were made with the 25 gauge needle using a transduodenal approach. A stylet was used. A cytotechnologist was present to evaluate the adequacy of the specimen. The cellularity of the specimen was adequate. Final cytology results are pending. The procedure was technically difficult as the metallic stent obscured views of the lesion. It was also very difficult to obtain adequate views for FNA, however, adequate samples were obtained.      11/11/2017 Pathology Results    Diagnosis FINE NEEDLE ASPIRATION, ENDOSCOPIC, PANCREAS HEAD(SPECIMEN 1 OF 1 COLLECTED 11/11/17): ADENOCARCINOMA. Preliminary Diagnosis Intraoperative Diagnosis: SPINDLE CELL FRAGMENTS AND RARE ATYPICAL EPITHELIAL CELLS.(JDP)      11/24/2017 Initial Diagnosis    Adenocarcinoma of head of pancreas (HCC)      12/01/2017 Imaging    CT Abdomen W Contrast 12/01/17 IMPRESSION: 1. 21 x 20 mm pancreatic head/uncinate process lesion without obvious involvement/invasion of the major surrounding vascular structures. 2.  Common bile duct stent in place with associated pneumobilia but decompression of the biliary tree. 3. Borderline enlarged celiac axis  and periportal lymph nodes. 4. No findings to suggest hepatic metastatic disease. 5. Advanced aortic and branch vessel calcifications but no aneurysm or dissection. 6. Patchy right basilar atelectasis.      01/05/2018 Surgery    LAPAROSCOPY DIAGNOSTIC ERAS PATHWAY and WHIPPLE PROCEDURE by Dr. Barry Dienes and Dr. Johney Maine 01/05/18      01/05/2018 Pathology Results    Diagnosis 01/05/18 1. Gallbladder, and lymph node - CHRONIC CHOLECYSTITIS. - ONE BENIGN PERICYSTIC LYMPH NODE (0/1). - THERE IS NO EVIDENCE OF MALIGNANCY. 2. Lymph node, biopsy, Common Hepatic Artery - THERE IS NO EVIDENCE OF CARCINOMA IN 1 OF 1 LYMPH NODE (0/1). 3. Whipple procedure/resection - INVASIVE ADENOCARCINOMA, MODERATELY DIFFERENTIATED, SPANNING 4.0 CM. - PERINEURAL INVASION IS IDENTIFIED. - LYMPHOVASCULAR INVASION IS IDENTIFIED. - ADENOCARCINOMA IS PRESENT AT THE UNCINATE (RED) MARGIN. - ADENOCARCINOMA EXTENDS INTO PERIPANCREATIC SOFT TISSUE. - METASTATIC CARCINOMA IN 5 OF 29 LYMPH NODES (5/29), WITH EXTRACAPSULAR EXTENSION. - SEE ONCOLOGY TABLE BELOW. 4. Lymph node, biopsy, Portal - THERE IS NO EVIDENCE OF CARCINOMA IN 1 OF 1 LYMPH NODE (0/1). 5. Lymph node, biopsy, Superior mesenteric artery - THERE IS NO EVIDENCE OF CARCINOMA IN 1 OF 1 LYMPH NODE (0/1). 6. Lymph node, biopsy, Ligament of Treitz - THERE IS NO EVIDENCE OF CARCINOMA IN 1 OF 1 LYMPH NODE (0/1)      01/05/2018 - 01/14/2018 Hospital Admission    Admit date: 01/05/18 Admission diagnosis: Fever from infection Additional comments: According to Discharge summary Pt was admitted to the ICU following and she had an epidural for pain control and low dose fentanyl PCA. Her NGT was able to be pulled on POD 2.5.  She had her foley pulled initially while epidural was still in and this had to be reinserted. She developed a fever, but UA was positive.  Once this was treated, she defervesced.  Culture did not grow anything. Her catheter was pulled after epidural was  removed and she did not have any issues.  She had an initial setback when she went from clears to fulls the first time, but n/v resolved.  Her PCA was d/c'd.  She was OOB and worked well with PT.  She had some initial electrolyte abnormalities that resolved.  Her drains were removed without issue.  She had a central line removed without incident.         01/05/2018 Pathology Results    Gallbladder, and lymph node with chronic cholecystitis. One benign pericystic lymph node (0/1). There is no evidence of malignancy. Lymph node, biopsy, Common Hepatic Artery with no evidence of carcinoma in 1 of 1 lymph nodes (0/1). Whipple procedure/resection with invasive adenocarcinoma, moderately differentiated, spanning 4.0 cm. Perineural invasion is identified. Lymphovascular invasion is identified. Adenocarcinoma is present at the uncinate (red) margin. Adenocarcinoma extends into peripancreatic soft tissue. metastatic carcinoma in 5 of 29 lymph nodes (5/29), with extracapsular extension. Lymph node, biopsy, Portal with no evidence of carcinoma in 1 of 1 lymph node (0/1). Lymph node, biopsy, Superior mesenteric artery with no evidence of carcinoma in 1 of 1 lymph node (0/1). Lymph node, biopsy, Ligament of Treitz with no evidence of carcinoma in 1 of 1 lymph node (0/1).        01/05/2018 Cancer Staging    Staging form: Exocrine Pancreas, AJCC 8th Edition - Pathologic stage from 01/05/2018: Stage III (pT3, pN2, cM0) - Signed by Truitt Merle, MD on  02/10/2018      02/16/2018 -  Chemotherapy    Gemcitabine 3 weeks on and 1 week off for 6 months starting on 02/16/18      03/09/2018 Imaging    CT CAP W contrast 03/09/18  IMPRESSION: 1. Acute pulmonary embolus demonstrated within the distal right main pulmonary artery extending into the right upper, right middle and right lower lobes. 2. Filling defect within the central right internal jugular vein may represent nonocclusive thrombus. 3. Interval development of multiple  enhancing lesions within the liver most compatible with hepatic metastatic disease. 4. Interval Whipple procedure. Small cystic lesion at the site of the pancreatic duodenal anastomosis, potentially postprocedural in etiology. Recommend attention on follow-up. 5. Wall thickening and surrounding fat stranding proximal duodenum, potentially postprocedural in etiology. Recommend attention on follow-up. 6. Critical Value/emergent results were called by telephone at the time of interpretation on 03/09/2018 at 2:49 pm to Dr. Truitt Merle , who verbally acknowledged these results.       03/15/2018 -  Chemotherapy    The patient had gemcitabine (GEMZAR) 1,558 mg in sodium chloride 0.9 % 250 mL chemo infusion, 800 mg/m2 = 1,558 mg (80 % of original dose 1,000 mg/m2), Intravenous,  Once, 1 of 4 cycles Dose modification: 800 mg/m2 (original dose 1,000 mg/m2, Cycle 1, Reason: Provider Judgment), 800 mg/m2 (original dose 1,000 mg/m2, Cycle 1, Reason: Provider Judgment) Administration: 1,558 mg (03/16/2018), 1,558 mg (03/23/2018) PACLitaxel-protein bound (ABRAXANE) chemo infusion 200 mg, 100 mg/m2 = 200 mg (100 % of original dose 100 mg/m2), Intravenous, Once, 1 of 4 cycles Dose modification: 100 mg/m2 (original dose 100 mg/m2, Cycle 1, Reason: Provider Judgment, Comment: advanced age ) Administration: 200 mg (03/16/2018), 200 mg (03/23/2018)  for chemotherapy treatment.      CURRENT THERAPY: Gemcitabine 3 weeks and 1 week off starting 02/16/18. Due tocancer recurrence,will add Abraxane to Gemcitabine and switch to 2 weeks on and 1 weeks off starting 03/16/18   INTERVAL HISTORY: Lauren Richard returns alone for f/u and cycle 1 day 8 gemcitabine/abraxane as scheduled. She tolerated day 1 relatively well. She continues to low energy and appetite. I prescribed remeron 1 day ago which she began last night. She vomited once after taking large potassium tab; denies nausea or diarrhea. She is constipated without BM in 2 days,  takes milk of magnesia occasionally. Has dry mouth without sores. No fever, chills, cough, chest pain  REVIEW OF SYSTEMS:   Constitutional: Denies fevers, chills or abnormal weight loss (+) fatigue (+) low appetite  Eyes: Denies blurriness of vision Ears, nose, mouth, throat, and face: Denies mucositis or sore throat (+) dry mouth  Respiratory: Denies cough, dyspnea or wheezes Cardiovascular: Denies palpitation, chest discomfort or lower extremity swelling Gastrointestinal:  Denies nausea, diarrhea, heartburn or change in bowel habits (+) constipation, no BM in 2 days (+) vomiting after swallowing potassium tab  Skin: Denies abnormal skin rashes Lymphatics: Denies new lymphadenopathy or easy bruising Neurological:Denies numbness, tingling or new weaknesses Behavioral/Psych: Mood is stable, no new changes  All other systems were reviewed with the patient and are negative.  MEDICAL HISTORY:  Past Medical History:  Diagnosis Date  . Adenocarcinoma of head of pancreas ([place]) 11/24/2017  . Arthritis    "all over" (04/26/2017); knees, shoulder  . Essential hypertension 03/03/2017  . Family history of breast cancer   . Family history of kidney cancer   . Family history of pancreatic cancer   . Family history of prostate cancer   . History of  blood transfusion 1979   "when I had my hysterectomy"  . Hyperlipidemia   . Hypothyroidism   . Obesity 03/03/2017  . Osteoarthritis 03/03/2017  . Pneumonia    as a child    SURGICAL HISTORY: Past Surgical History:  Procedure Laterality Date  . BREAST CYST EXCISION Right    "cut 6 cysts out"  . CYST EXCISION Left    "off my toe"  . DILATION AND CURETTAGE OF UTERUS     "before hysterectomy"  . ERCP N/A 11/04/2017   Procedure: ENDOSCOPIC RETROGRADE CHOLANGIOPANCREATOGRAPHY (ERCP);  Surgeon: Carol Ada, MD;  Location: [place];  Service: Endoscopy;  Laterality: N/A;  . EUS N/A 11/11/2017   Procedure: UPPER ENDOSCOPIC ULTRASOUND (EUS) LINEAR;   Surgeon: Carol Ada, MD;  Location: [facility];  Service: Endoscopy;  Laterality: N/A;  . FINE NEEDLE ASPIRATION N/A 11/11/2017   Procedure: FINE NEEDLE ASPIRATION (FNA) LINEAR;  Surgeon: Carol Ada, MD;  Location: [facility];  Service: Endoscopy;  Laterality: N/A;  . INGUINAL HERNIA REPAIR Right   . JOINT REPLACEMENT    . LAPAROSCOPY N/A 01/05/2018   Procedure: LAPAROSCOPY DIAGNOSTIC ERAS PATHWAY;  Surgeon: Stark Klein, MD;  Location: [place];  Service: General;  Laterality: N/A;  [place]  . REDUCTION MAMMAPLASTY Bilateral 1980  . TONSILLECTOMY     "when I small"  . TOTAL ABDOMINAL HYSTERECTOMY  1979  . TOTAL KNEE ARTHROPLASTY Right 04/26/2017  . TOTAL KNEE ARTHROPLASTY Left 04/26/2017   Procedure: LEFT TOTAL KNEE ARTHROPLASTY;  Surgeon: [name], MD;  Location: [place];  Service: Orthopedics;  Laterality: Left;  . WHIPPLE PROCEDURE N/A 01/05/2018   Procedure: WHIPPLE PROCEDURE;  Surgeon: Stark Klein, MD;  Location: [place];  Service: General;  Laterality: N/A;    I have reviewed the social history and family history with the patient and they are unchanged from previous note.  ALLERGIES:  has No Known Allergies.  MEDICATIONS:  Current Outpatient Medications  Medication Sig Dispense Refill  . acetaminophen (TYLENOL) 500 MG tablet Take 500-1,000 mg by mouth 2 (two) times daily as needed for moderate pain or headache.     . Amino Acids-Protein Hydrolys (FEEDING SUPPLEMENT, PRO-STAT SUGAR FREE 64,) LIQD Take 30 mLs by mouth 2 (two) times daily. 900 mL 0  . amLODipine (NORVASC) 5 MG tablet Take 5 mg by mouth daily.     Marland Kitchen aspirin 81 MG tablet Take 81 mg by mouth daily.    Marland Kitchen atenolol (TENORMIN) 25 MG tablet Take 1 tablet (25 mg total) by mouth daily. 30 tablet 0  . Cholecalciferol (VITAMIN D) 2000 units tablet Take 2,000 Units by mouth daily.    Marland Kitchen gabapentin (NEURONTIN) 100 MG capsule Take 2 capsules (200 mg total) by mouth 2 (two) times daily. 30 capsule 0  . magnesium oxide  (MAG-OX) 400 (241.3 Mg) MG tablet Take 1 tablet (400 mg total) by mouth 2 (two) times daily. 60 tablet 0  . mirtazapine (REMERON) 7.5 MG tablet Take 1 tablet (7.5 mg total) by mouth at bedtime. 30 tablet 1  . pantoprazole (PROTONIX) 40 MG tablet Take 1 tablet (40 mg total) by mouth daily at 12 noon. 30 tablet 0  . potassium chloride SA (K-DUR,KLOR-CON) 20 MEQ tablet Take 2 tablets (40 mEq total) by mouth daily. 30 tablet 0  . PRESCRIPTION MEDICATION Apply 1 application topically 4 (four) times daily as needed (pain). Dic 3%, Bac 2%, lid - pril 2-5%, Menth 1% cream    . Rivaroxaban 15 & 20 MG TBPK  Take as directed on package: Start with one 15mg  tablet by mouth twice a day with food. On Day 22, switch to one 20mg  tablet once a day with food. 51 each 0  . trolamine salicylate (ASPERCREME) 10 % cream Apply 1 application topically as needed for muscle pain.    Marland Kitchen nystatin (MYCOSTATIN) 100000 UNIT/ML suspension Take 5 mLs (500,000 Units total) by mouth 4 (four) times daily. 100 mL 0   No current facility-administered medications for this visit.     PHYSICAL EXAMINATION: ECOG PERFORMANCE STATUS: 1 - Symptomatic but completely ambulatory  Vitals:   03/23/18 1055  BP: 132/70  Pulse: 79  Resp: 18  Temp: 98.1 F (36.7 C)  SpO2: 98%   Filed Weights   03/23/18 1055  Weight: 168 lb 4.8 oz (76.3 kg)    GENERAL:alert, no distress and comfortable SKIN: no rashes or significant lesions EYES: normal, Conjunctiva are pink and non-injected, sclera clear OROPHARYNX: white coating to tongue, no ulcers  LYMPH:  no palpable cervical or supraclavicular lymphadenopathy LUNGS: clear to auscultation with normal breathing effort HEART: regular rate & rhythm and no murmurs and no lower extremity edema ABDOMEN:abdomen soft, non-tender and normal bowel sounds. Midline incision healed well  Musculoskeletal:no cyanosis of digits and no clubbing  NEURO: alert & oriented x 3 with fluent speech, no focal  motor/sensory deficits  LABORATORY DATA:  I have reviewed the data as listed CBC Latest Ref Rng & Units 03/23/2018 03/16/2018 03/02/2018  WBC 3.9 - 10.3 K/uL 2.7(L) 5.9 3.1(L)  Hemoglobin 11.6 - 15.9 g/dL 10.2(L) 10.7(L) 10.0(L)  Hematocrit 34.8 - 46.6 % 31.3(L) 33.0(L) 30.3(L)  Platelets 145 - 400 K/uL 522(H) 532(H) 168     CMP Latest Ref Rng & Units 03/23/2018 03/16/2018 03/02/2018  Glucose 70 - 140 mg/dL 108 114 109  BUN 7 - 26 mg/dL 8 11 8   Creatinine 0.60 - 1.10 mg/dL 0.85 0.96 0.74  Sodium 136 - 145 mmol/L 139 136 140  Potassium 3.5 - 5.1 mmol/L 3.4(L) 4.2 2.7(LL)  Chloride 98 - 109 mmol/L 107 107 108  CO2 22 - 29 mmol/L 24 19(L) 23  Calcium 8.4 - 10.4 mg/dL 9.6 9.7 9.2  Total Protein 6.4 - 8.3 g/dL 7.6 7.9 7.3  Total Bilirubin 0.2 - 1.2 mg/dL 0.5 0.5 0.4  Alkaline Phos 40 - 150 U/L 143 185(H) 128  AST 5 - 34 U/L 217(HH) 182(HH) 151(H)  ALT 0 - 55 U/L 110(H) 123(H) 89(H)   PATHOLOGY Diagnosis 11/11/17 FINE NEEDLE ASPIRATION, ENDOSCOPIC, PANCREAS HEAD(SPECIMEN 1 OF 1 COLLECTED 11/11/17): ADENOCARCINOMA. Preliminary Diagnosis Intraoperative Diagnosis: SPINDLE CELL FRAGMENTS AND RARE ATYPICAL EPITHELIAL CELLS.(JDP)  Whipple Procedure, 01/05/2018 1. Gallbladder, and lymph node - CHRONIC CHOLECYSTITIS. - ONE BENIGN PERICYSTIC LYMPH NODE (0/1). - THERE IS NO EVIDENCE OF MALIGNANCY. 2. Lymph node, biopsy, Common Hepatic Artery - THERE IS NO EVIDENCE OF CARCINOMA IN 1 OF 1 LYMPH NODE (0/1). 3. Whipple procedure/resection - INVASIVE ADENOCARCINOMA, MODERATELY DIFFERENTIATED, SPANNING 4.0 CM. - PERINEURAL INVASION IS IDENTIFIED. - LYMPHOVASCULAR INVASION IS IDENTIFIED. - ADENOCARCINOMA IS PRESENT AT THE UNCINATE (RED) MARGIN. - ADENOCARCINOMA EXTENDS INTO PERIPANCREATIC SOFT TISSUE. - METASTATIC CARCINOMA IN 5 OF 29 LYMPH NODES (5/29), WITH EXTRACAPSULAR EXTENSION. - SEE ONCOLOGY TABLE BELOW. 4. Lymph node, biopsy, Portal - THERE IS NO EVIDENCE OF CARCINOMA IN 1 OF 1 LYMPH NODE  (0/1). 5. Lymph node, biopsy, Superior mesenteric artery - THERE IS NO EVIDENCE OF CARCINOMA IN 1 OF 1 LYMPH NODE (0/1). 6. Lymph node, biopsy, Ligament of Treitz -  THERE IS NO EVIDENCE OF CARCINOMA IN 1 OF 1 LYMPH NODE (0/1).   RADIOGRAPHIC STUDIES: I have personally reviewed the radiological images as listed and agreed with the findings in the report. No results found.   ASSESSMENT & PLAN: Lauren Richard is a pleasant 82 y.o.AAF who presented with syncopal event and obstructive juandice found to have malignant pancreatic head mass.   1. Adenocarcinoma of pancreatic headmetastatic to the liver, pT3N2M0, stage IV, G2, with positive margin, liver recurrence in 02/2018 2. PE right pulmonary artery extended to smaller blood vessels; on xarelto since 03/09/18 3. Weight loss, decreased appetite, fatigue 4. HTN, HL 5. Transaminitis  6. Social support 7. Goals of care discussion - full code  8. Oral thrush   Lauren Richard appears stable. She completed cycle 1 day 1 dose-reduced gemctabine 800 mg/m2 and abraxane 100 mg/m2. She tolerated well overall. For constipation I encouraged her to use milk of magnesia or miralax to induce BM, she agrees. Again encouraged her to increase po intake and activity level to help fatigue. She recently started remeron for appetite stimulant. She has oral thrush, will call in nystatin oral rinse.   She had genetics consult and labs drawn today. CBC and CMP reviewed. Anemia is stable. Platelets are elevated to 522, which could be reactive to anemia. Will check iron studies at next lab draw. ANC 0.9. Will check labs and plan to give 1 dose granix next week. We reviewed neutropenic precautions. She has mild hypokalemia, K 3.4; previously I told her to take 1 oral K tab daily, today I recommend she increase to BID and maintain that. Transaminitis is worse, likely related to liver metastasis and chemotherapy. I reviewed with Dr. Burr Medico. Will proceed with cycle 1 day 8 gemcitabine  800 mg/m2 and abraxane 100 mg/m2 today.   Return in 1 week for lab and granix, and in 2 weeks for f/u and cycle 2. She plans to undergo PAC placement by Dr. Barry Dienes on 6/26. She will need to hold xarelto likely for 2 days prior. I will confirm with Dr. Barry Dienes.   PLAN -Labs reviewed, OK to treat cycle 1 day 8 dose-reduced gemcitabine/abraxane for ANC 0.9 and elevated LFTs -lab (including iron studies) and granix in 1 week  -[facility] 04/05/18 per Dr. Barry Dienes, will hold xarelto x2 days prior and will verify with surgeon  -Return for lab, flush, f/u Dr. Burr Medico, and cycle 3 on 6/27 -Nystatin rinse for thrush    Orders Placed This Encounter  Procedures  . Iron and TIBC    Standing Status:   Standing    Number of Occurrences:   50    Standing Expiration Date:   03/24/2019  . Ferritin    Standing Status:   Standing    Number of Occurrences:   50    Standing Expiration Date:   03/24/2019   All questions were answered. The patient knows to call the clinic with any problems, questions or concerns. No barriers to learning was detected. I spent 20 minutes counseling the patient face to face. The total time spent in the appointment was 25 minutes and more than 50% was on counseling and review of test results     Alla Feeling, NP 03/23/18

## 2018-03-24 ENCOUNTER — Other Ambulatory Visit: Payer: Self-pay

## 2018-03-24 DIAGNOSIS — B370 Candidal stomatitis: Secondary | ICD-10-CM

## 2018-03-24 MED ORDER — NYSTATIN 100000 UNIT/ML MT SUSP: 5.0000 mL | Freq: Four times a day (QID) | OROMUCOSAL | 0 refills | Status: DC

## 2018-03-24 NOTE — Telephone Encounter (Signed)
Called patient to let her know medication is on back order at [facility], will send script into [facility], patient verbalized an understanding.

## 2018-03-27 ENCOUNTER — Inpatient Hospital Stay: Payer: PPO | Admitting: Nutrition

## 2018-03-27 ENCOUNTER — Telehealth: Payer: Self-pay | Admitting: *Deleted

## 2018-03-27 NOTE — Telephone Encounter (Signed)
From what I recall, she is able to manage at home. I'll plan to speak with her on 6/20 when she is here for lab and granix.  Thanks,  Regan Rakers NP

## 2018-03-27 NOTE — Telephone Encounter (Signed)
Lacie, you saw her for the last few visits. If you feel she does need home care, please go ahead to set up with advanced home care or palliative care, depends on her needs, thanks  Truitt Merle MD

## 2018-03-27 NOTE — Telephone Encounter (Signed)
Niece Baruch Goldmann called requesting a call back from nurse.  Spoke with Nevin Bloodgood, and was informed that pt had diarrhea for past 2 days  -  Pt had constipation problem, has been taking Miralax for past several days, and now has diarrhea.  Instructed Nevin Bloodgood to give pt  Imodium  2 tabs now, and to give pt  BRAT diet.  Instructed Paula to give pt gatorade, broth - push fluids as much as tolerated.  Per Nevin Bloodgood, pt did not have much appetite but able to eat. Nurse could hear pt answered questions appropriately in the background.  Nevin Bloodgood understood that if symptoms worsened during evening /night, pt needs to go to ER for further evaluation.  Instructed Nevin Bloodgood to call office back early in the am if pt still has above symptoms. Nevin Bloodgood also expressed needs for home health aide for pt.  Demetrios Isaacs to discuss concern with Dr. Burr Medico at next office visit. Nevin Bloodgood voiced understanding. Paula's    Phone      [phone].

## 2018-03-29 ENCOUNTER — Inpatient Hospital Stay (HOSPITAL_COMMUNITY)
Admission: EM | Admit: 2018-03-29 | Discharge: 2018-04-04 | DRG: 948 | Disposition: A | Discharge status: Home Health | Payer: PPO | Attending: Internal Medicine | Admitting: Internal Medicine

## 2018-03-29 ENCOUNTER — Other Ambulatory Visit (HOSPITAL_COMMUNITY): Payer: Self-pay

## 2018-03-29 ENCOUNTER — Ambulatory Visit: Payer: PPO | Admitting: Rheumatology

## 2018-03-29 ENCOUNTER — Encounter (HOSPITAL_COMMUNITY): Payer: Self-pay

## 2018-03-29 ENCOUNTER — Other Ambulatory Visit: Payer: Self-pay

## 2018-03-29 DIAGNOSIS — C250 Malignant neoplasm of head of pancreas: Secondary | ICD-10-CM | POA: Diagnosis present

## 2018-03-29 DIAGNOSIS — R1111 Vomiting without nausea: Secondary | ICD-10-CM | POA: Diagnosis not present

## 2018-03-29 DIAGNOSIS — F5000 Anorexia nervosa, unspecified: Secondary | ICD-10-CM

## 2018-03-29 DIAGNOSIS — K219 Gastro-esophageal reflux disease without esophagitis: Secondary | ICD-10-CM | POA: Diagnosis present

## 2018-03-29 DIAGNOSIS — E872 Acidosis: Secondary | ICD-10-CM | POA: Diagnosis present

## 2018-03-29 DIAGNOSIS — R630 Anorexia: Secondary | ICD-10-CM | POA: Diagnosis not present

## 2018-03-29 DIAGNOSIS — R5081 Fever presenting with conditions classified elsewhere: Secondary | ICD-10-CM | POA: Diagnosis present

## 2018-03-29 DIAGNOSIS — R6251 Failure to thrive (child): Secondary | ICD-10-CM | POA: Diagnosis not present

## 2018-03-29 DIAGNOSIS — R197 Diarrhea, unspecified: Secondary | ICD-10-CM | POA: Diagnosis not present

## 2018-03-29 DIAGNOSIS — Z7901 Long term (current) use of anticoagulants: Secondary | ICD-10-CM

## 2018-03-29 DIAGNOSIS — R112 Nausea with vomiting, unspecified: Secondary | ICD-10-CM | POA: Diagnosis not present

## 2018-03-29 DIAGNOSIS — E876 Hypokalemia: Secondary | ICD-10-CM | POA: Diagnosis present

## 2018-03-29 DIAGNOSIS — E785 Hyperlipidemia, unspecified: Secondary | ICD-10-CM | POA: Diagnosis not present

## 2018-03-29 DIAGNOSIS — J9811 Atelectasis: Secondary | ICD-10-CM | POA: Diagnosis not present

## 2018-03-29 DIAGNOSIS — Z90710 Acquired absence of both cervix and uterus: Secondary | ICD-10-CM

## 2018-03-29 DIAGNOSIS — Z96653 Presence of artificial knee joint, bilateral: Secondary | ICD-10-CM | POA: Diagnosis not present

## 2018-03-29 DIAGNOSIS — K521 Toxic gastroenteritis and colitis: Secondary | ICD-10-CM | POA: Diagnosis not present

## 2018-03-29 DIAGNOSIS — Z86711 Personal history of pulmonary embolism: Secondary | ICD-10-CM

## 2018-03-29 DIAGNOSIS — T451X5A Adverse effect of antineoplastic and immunosuppressive drugs, initial encounter: Secondary | ICD-10-CM | POA: Diagnosis present

## 2018-03-29 DIAGNOSIS — K869 Disease of pancreas, unspecified: Secondary | ICD-10-CM | POA: Diagnosis not present

## 2018-03-29 DIAGNOSIS — D709 Neutropenia, unspecified: Secondary | ICD-10-CM | POA: Diagnosis present

## 2018-03-29 DIAGNOSIS — R531 Weakness: Principal | ICD-10-CM | POA: Diagnosis present

## 2018-03-29 DIAGNOSIS — Z800 Family history of malignant neoplasm of digestive organs: Secondary | ICD-10-CM

## 2018-03-29 DIAGNOSIS — E860 Dehydration: Secondary | ICD-10-CM | POA: Diagnosis not present

## 2018-03-29 DIAGNOSIS — K8689 Other specified diseases of pancreas: Secondary | ICD-10-CM | POA: Diagnosis present

## 2018-03-29 DIAGNOSIS — I10 Essential (primary) hypertension: Secondary | ICD-10-CM | POA: Diagnosis not present

## 2018-03-29 DIAGNOSIS — Z7982 Long term (current) use of aspirin: Secondary | ICD-10-CM | POA: Diagnosis not present

## 2018-03-29 DIAGNOSIS — R740 Nonspecific elevation of levels of transaminase and lactic acid dehydrogenase [LDH]: Secondary | ICD-10-CM | POA: Diagnosis present

## 2018-03-29 DIAGNOSIS — D701 Agranulocytosis secondary to cancer chemotherapy: Secondary | ICD-10-CM | POA: Diagnosis not present

## 2018-03-29 DIAGNOSIS — C799 Secondary malignant neoplasm of unspecified site: Secondary | ICD-10-CM | POA: Diagnosis not present

## 2018-03-29 DIAGNOSIS — R627 Adult failure to thrive: Secondary | ICD-10-CM | POA: Diagnosis not present

## 2018-03-29 DIAGNOSIS — E162 Hypoglycemia, unspecified: Secondary | ICD-10-CM | POA: Diagnosis not present

## 2018-03-29 DIAGNOSIS — E7800 Pure hypercholesterolemia, unspecified: Secondary | ICD-10-CM | POA: Diagnosis not present

## 2018-03-29 DIAGNOSIS — D708 Other neutropenia: Secondary | ICD-10-CM | POA: Diagnosis not present

## 2018-03-29 DIAGNOSIS — R110 Nausea: Secondary | ICD-10-CM | POA: Diagnosis not present

## 2018-03-29 DIAGNOSIS — Z79899 Other long term (current) drug therapy: Secondary | ICD-10-CM

## 2018-03-29 DIAGNOSIS — D6481 Anemia due to antineoplastic chemotherapy: Secondary | ICD-10-CM | POA: Diagnosis not present

## 2018-03-29 DIAGNOSIS — C259 Malignant neoplasm of pancreas, unspecified: Secondary | ICD-10-CM | POA: Diagnosis not present

## 2018-03-29 LAB — URINALYSIS, ROUTINE W REFLEX MICROSCOPIC
Bilirubin Urine: NEGATIVE
GLUCOSE, UA: NEGATIVE mg/dL
KETONES UR: NEGATIVE mg/dL
NITRITE: NEGATIVE
PROTEIN: 30 mg/dL — AB
Specific Gravity, Urine: 1.016 (ref 1.005–1.030)
pH: 6 (ref 5.0–8.0)

## 2018-03-29 LAB — COMPREHENSIVE METABOLIC PANEL
ALBUMIN: 2.9 g/dL — AB (ref 3.5–5.0)
ALK PHOS: 92 U/L (ref 38–126)
ALT: 82 U/L — ABNORMAL HIGH (ref 14–54)
ANION GAP: 8 (ref 5–15)
AST: 172 U/L — ABNORMAL HIGH (ref 15–41)
BUN: 8 mg/dL (ref 6–20)
CO2: 26 mmol/L (ref 22–32)
Calcium: 8.9 mg/dL (ref 8.9–10.3)
Chloride: 104 mmol/L (ref 101–111)
Creatinine, Ser: 1.06 mg/dL — ABNORMAL HIGH (ref 0.44–1.00)
GFR calc Af Amer: 56 mL/min — ABNORMAL LOW (ref >60)
GFR calc non Af Amer: 48 mL/min — ABNORMAL LOW (ref >60)
GLUCOSE: 141 mg/dL — AB (ref 65–99)
POTASSIUM: 2.7 mmol/L — AB (ref 3.5–5.1)
Sodium: 138 mmol/L (ref 135–145)
Total Bilirubin: 1.1 mg/dL (ref 0.3–1.2)
Total Protein: 7.3 g/dL (ref 6.5–8.1)

## 2018-03-29 LAB — CBC WITH DIFFERENTIAL/PLATELET
Basophils Absolute: 0 10*3/uL (ref 0.0–0.1)
Basophils Relative: 1 %
Eosinophils Absolute: 0 10*3/uL (ref 0.0–0.7)
Eosinophils Relative: 0 %
HEMATOCRIT: 27.6 % — AB (ref 36.0–46.0)
Hemoglobin: 8.8 g/dL — ABNORMAL LOW (ref 12.0–15.0)
LYMPHS ABS: 0.5 10*3/uL (ref 0.7–4.0)
Lymphocytes Relative: 51 %
MCH: 28.7 pg (ref 26.0–34.0)
MCHC: 31.9 g/dL (ref 30.0–36.0)
MCV: 89.9 fL (ref 78.0–100.0)
MONO ABS: 0 10*3/uL (ref 0.1–1.0)
MONOS PCT: 2 %
NEUTROS ABS: 0.4 10*3/uL (ref 1.7–7.7)
Neutrophils Relative %: 46 %
Platelets: 172 10*3/uL (ref 150–400)
RBC: 3.07 MIL/uL — ABNORMAL LOW (ref 3.87–5.11)
RDW: 17.6 % — AB (ref 11.5–15.5)
WBC: 0.9 10*3/uL — CL (ref 4.0–10.5)

## 2018-03-29 LAB — URIC ACID: Uric Acid, Serum: 4.8 mg/dL (ref 2.3–6.6)

## 2018-03-29 LAB — I-STAT CG4 LACTIC ACID, ED: Lactic Acid, Venous: 2.84 mmol/L (ref 0.5–1.9)

## 2018-03-29 LAB — MAGNESIUM: MAGNESIUM: 1.4 mg/dL — AB (ref 1.7–2.4)

## 2018-03-29 MED ORDER — NYSTATIN 100000 UNIT/ML MT SUSP: 5.0000 mL | Freq: Four times a day (QID) | OROMUCOSAL | Status: DC | PRN

## 2018-03-29 MED ORDER — ASPIRIN 81 MG PO TABS: 81.0000 mg | ORAL_TABLET | Freq: Every day | ORAL | Status: DC

## 2018-03-29 MED ORDER — AMLODIPINE BESYLATE 5 MG PO TABS
5.0000 mg | ORAL_TABLET | Freq: Every day | ORAL | Status: DC
  Administered 2018-03-30 – 2018-04-04 (×5): 5 mg via ORAL
  Filled 2018-03-29 (×6): qty 1

## 2018-03-29 MED ORDER — RIVAROXABAN 20 MG PO TABS
20.0000 mg | ORAL_TABLET | Freq: Every day | ORAL | Status: DC
  Administered 2018-04-01 – 2018-04-03 (×3): 20 mg via ORAL
  Filled 2018-03-29 (×3): qty 1

## 2018-03-29 MED ORDER — MAGNESIUM SULFATE 2 GM/50ML IV SOLN
2.0000 g | Freq: Once | INTRAVENOUS | Status: AC
  Administered 2018-03-29: 2 g via INTRAVENOUS
  Filled 2018-03-29: qty 50

## 2018-03-29 MED ORDER — POTASSIUM CHLORIDE 10 MEQ/100ML IV SOLN
10.0000 meq | INTRAVENOUS | Status: AC
  Administered 2018-03-29 (×2): 10 meq via INTRAVENOUS
  Filled 2018-03-29 (×2): qty 100

## 2018-03-29 MED ORDER — ACETAMINOPHEN 325 MG PO TABS
650.0000 mg | ORAL_TABLET | Freq: Four times a day (QID) | ORAL | Status: DC | PRN
  Administered 2018-03-31 (×2): 650 mg via ORAL
  Filled 2018-03-29 (×2): qty 2

## 2018-03-29 MED ORDER — MIRTAZAPINE 7.5 MG PO TABS
7.5000 mg | ORAL_TABLET | Freq: Every day | ORAL | Status: DC
  Administered 2018-03-29 – 2018-04-03 (×6): 7.5 mg via ORAL
  Filled 2018-03-29 (×6): qty 1

## 2018-03-29 MED ORDER — GABAPENTIN 100 MG PO CAPS
200.0000 mg | ORAL_CAPSULE | Freq: Two times a day (BID) | ORAL | Status: DC
  Administered 2018-03-29 – 2018-04-04 (×12): 200 mg via ORAL
  Filled 2018-03-29 (×12): qty 2

## 2018-03-29 MED ORDER — SODIUM CHLORIDE 0.9 % IV SOLN
INTRAVENOUS | Status: DC
  Administered 2018-03-29 – 2018-04-02 (×5): via INTRAVENOUS

## 2018-03-29 MED ORDER — POTASSIUM CHLORIDE 20 MEQ/15ML (10%) PO SOLN
40.0000 meq | Freq: Once | ORAL | Status: AC
  Administered 2018-03-29: 40 meq via ORAL
  Filled 2018-03-29: qty 30

## 2018-03-29 MED ORDER — ASPIRIN 81 MG PO CHEW
81.0000 mg | CHEWABLE_TABLET | Freq: Every day | ORAL | Status: DC
  Administered 2018-03-30 – 2018-04-04 (×6): 81 mg via ORAL
  Filled 2018-03-29 (×5): qty 1

## 2018-03-29 MED ORDER — ONDANSETRON HCL 4 MG/2ML IJ SOLN
4.0000 mg | Freq: Four times a day (QID) | INTRAMUSCULAR | Status: DC | PRN
  Administered 2018-03-29: 4 mg via INTRAVENOUS
  Filled 2018-03-29: qty 2

## 2018-03-29 MED ORDER — ONDANSETRON HCL 4 MG PO TABS: 4.0000 mg | ORAL_TABLET | Freq: Four times a day (QID) | ORAL | Status: DC | PRN

## 2018-03-29 MED ORDER — PANTOPRAZOLE SODIUM 40 MG PO TBEC
40.0000 mg | DELAYED_RELEASE_TABLET | Freq: Every day | ORAL | Status: DC
  Administered 2018-03-30 – 2018-04-04 (×6): 40 mg via ORAL
  Filled 2018-03-29 (×7): qty 1

## 2018-03-29 MED ORDER — ACETAMINOPHEN 650 MG RE SUPP: 650.0000 mg | Freq: Four times a day (QID) | RECTAL | Status: DC | PRN

## 2018-03-29 MED ORDER — ATENOLOL 25 MG PO TABS
25.0000 mg | ORAL_TABLET | Freq: Every day | ORAL | Status: DC
  Administered 2018-03-30 – 2018-04-04 (×6): 25 mg via ORAL
  Filled 2018-03-29 (×6): qty 1

## 2018-03-29 MED ORDER — LACTATED RINGERS IV BOLUS
1000.0000 mL | Freq: Once | INTRAVENOUS | Status: AC
  Administered 2018-03-29: 1000 mL via INTRAVENOUS

## 2018-03-29 MED ORDER — POTASSIUM CHLORIDE CRYS ER 20 MEQ PO TBCR
40.0000 meq | EXTENDED_RELEASE_TABLET | Freq: Once | ORAL | Status: DC
  Filled 2018-03-29: qty 2

## 2018-03-29 MED ORDER — RIVAROXABAN 15 MG PO TABS
15.0000 mg | ORAL_TABLET | Freq: Two times a day (BID) | ORAL | Status: AC
  Administered 2018-03-29 – 2018-03-31 (×5): 15 mg via ORAL
  Filled 2018-03-29 (×5): qty 1

## 2018-03-29 MED ORDER — TRAMADOL HCL 50 MG PO TABS: 50.0000 mg | ORAL_TABLET | Freq: Two times a day (BID) | ORAL | Status: DC | PRN

## 2018-03-29 NOTE — ED Notes (Signed)
Bed: [facility] Expected date:  Expected time:  Means of arrival:  Comments: EMS n/v/d

## 2018-03-29 NOTE — H&P (Addendum)
History and Physical    Lauren Richard:[id number] DOB: [birth year] DOA: 03/29/2018  PCP: Glendale Chard, MD  Patient coming from: Home  Chief Complaint: Weakness, nausea, vomiting, diarrhea   HPI: Lauren Richard is a 82 y.o. female with medical history significant of metastatic pancreatic cancer, HTN, GERD, recent dx PE on xarelto who presents with generalized weakness.  She states that her chemotherapy regimen was increased about a week ago.  Since then, she has noticed generalized weakness, nausea, vomiting 2-3 episodes this morning, diarrhea since Sunday 2-3 episodes.  States that she feels weak overall, even taking a shower this morning took much effort.  She has no other complaints of fevers, new or worsening cough, shortness of breath, chest pain, abdominal pain, dysuria.  ED Course: Labs obtained which revealed potassium 2.7, creatinine 1.06, AST 172, ALT 82, WBC 0.9, hemoglobin 8.8.  Due to neutropenia, blood cultures were obtained.  No antibiotics started as patient is not febrile.  Review of Systems: As per HPI otherwise 10 point review of systems negative.   Past Medical History:  Diagnosis Date  . Adenocarcinoma of head of pancreas ([place]) 11/24/2017  . Arthritis    "all over" (04/26/2017); knees, shoulder  . Essential hypertension 03/03/2017  . Family history of breast cancer   . Family history of kidney cancer   . Family history of pancreatic cancer   . Family history of prostate cancer   . History of blood transfusion 1979   "when I had my hysterectomy"  . Hyperlipidemia   . Hypothyroidism   . Obesity 03/03/2017  . Osteoarthritis 03/03/2017  . Pneumonia    as a child    Past Surgical History:  Procedure Laterality Date  . BREAST CYST EXCISION Right    "cut 6 cysts out"  . CYST EXCISION Left    "off my toe"  . DILATION AND CURETTAGE OF UTERUS     "before hysterectomy"  . ERCP N/A 11/04/2017   Procedure: ENDOSCOPIC RETROGRADE CHOLANGIOPANCREATOGRAPHY (ERCP);   Surgeon: Carol Ada, MD;  Location: [place];  Service: Endoscopy;  Laterality: N/A;  . EUS N/A 11/11/2017   Procedure: UPPER ENDOSCOPIC ULTRASOUND (EUS) LINEAR;  Surgeon: Carol Ada, MD;  Location[facility];  Service: Endoscopy;  Laterality: N/A;  . FINE NEEDLE ASPIRATION N/A 11/11/2017   Procedure: FINE NEEDLE ASPIRATION (FNA) LINEAR;  Surgeon: Carol Ada, MD;  [facility];  Service: Endoscopy;  Laterality: N/A;  . INGUINAL HERNIA REPAIR Right   . JOINT REPLACEMENT    . LAPAROSCOPY N/A 01/05/2018   Procedure: LAPAROSCOPY DIAGNOSTIC ERAS PATHWAY;  Surgeon: Stark Klein, MD;  Location: [place];  Service: General;  Laterality: N/A;  [place]  . REDUCTION MAMMAPLASTY Bilateral 1980  . TONSILLECTOMY     "when I small"  . TOTAL ABDOMINAL HYSTERECTOMY  1979  . TOTAL KNEE ARTHROPLASTY Right 04/26/2017  . TOTAL KNEE ARTHROPLASTY Left 04/26/2017   Procedure: LEFT TOTAL KNEE ARTHROPLASTY;  Surgeon: [name], MD;  Location: [place];  Service: Orthopedics;  Laterality: Left;  . WHIPPLE PROCEDURE N/A 01/05/2018   Procedure: WHIPPLE PROCEDURE;  Surgeon: Stark Klein, MD;  Location: [place];  Service: General;  Laterality: N/A;     reports that she has never smoked. She has never used smokeless tobacco. She reports that she does not drink alcohol or use drugs.  No Known Allergies  Family History  Problem Relation Age of Onset  . Kidney cancer Mother 30  . Lung cancer Father 53  lung, smoker  . Congestive Heart Failure Sister   . Asthma Sister   . Atrial fibrillation Sister   . Hypertension Sister   . Diabetes Sister   . Head & neck cancer Paternal Grandmother   . Cancer Maternal Uncle        type unk  . Pancreatic cancer Paternal Aunt        dx >50  . Cancer Paternal Uncle        type unk  . Breast cancer Cousin 42  . Prostate cancer Other 62       had robotic surgery    Prior to Admission medications   Medication Sig Start Date End Date Taking?  Authorizing Provider  amLODipine (NORVASC) 5 MG tablet Take 5 mg by mouth daily.  08/12/16  Yes [provider]  aspirin 81 MG tablet Take 81 mg by mouth daily.   Yes [provider]  atenolol (TENORMIN) 25 MG tablet Take 1 tablet (25 mg total) by mouth daily. 11/07/17  Yes Hongalgi, Lenis Dickinson, MD  Cholecalciferol (VITAMIN D) 2000 units tablet Take 2,000 Units by mouth daily.   Yes [provider]  gabapentin (NEURONTIN) 100 MG capsule Take 2 capsules (200 mg total) by mouth 2 (two) times daily. 01/13/18  Yes Stark Klein, MD  mirtazapine (REMERON) 7.5 MG tablet Take 1 tablet (7.5 mg total) by mouth at bedtime. 03/21/18  Yes Alla Feeling, NP  nystatin (MYCOSTATIN) 100000 UNIT/ML suspension Take 5 mLs (500,000 Units total) by mouth 4 (four) times daily. Patient taking differently: Take 5 mLs by mouth 4 (four) times daily as needed (for mouth soreness).  03/24/18  Yes Alla Feeling, NP  ondansetron (ZOFRAN) 8 MG tablet Take 8 mg by mouth 2 (two) times daily as needed for nausea/vomiting. 03/23/18  Yes [provider]  pantoprazole (PROTONIX) 40 MG tablet Take 1 tablet (40 mg total) by mouth daily at 12 noon. 01/13/18  Yes Stark Klein, MD  potassium chloride SA (K-DUR,KLOR-CON) 20 MEQ tablet Take 2 tablets (40 mEq total) by mouth daily. Patient taking differently: Take 20 mEq by mouth daily.  01/13/18  Yes Stark Klein, MD  PRESCRIPTION MEDICATION Apply 1 application topically 4 (four) times daily as needed (for back pain). Dic 3%, Bac 2%, lid - pril 2-5%, Menth 1% cream    Yes [provider]  prochlorperazine (COMPAZINE) 10 MG tablet Take 10 mg by mouth every 6 (six) hours as needed for nausea or vomiting.   Yes [provider]  Rivaroxaban 15 & 20 MG TBPK Take as directed on package: Start with one 15mg  tablet by mouth twice a day with food. On Day 22, switch to one 20mg  tablet once a day with food. Patient taking differently: Take 15-20 mg by mouth  See admin instructions. Take as directed on package: Start with one 15mg  tablet by mouth twice a day with food. On Day 22, switch to one 20mg  tablet once a day with food. 03/09/18  Yes Truitt Merle, MD  traMADol (ULTRAM) 50 MG tablet Take 50 mg by mouth every 12 (twelve) hours as needed (for pain.).   Yes [provider]  trolamine salicylate (ASPERCREME) 10 % cream Apply 1 application topically 4 (four) times daily as needed (for knee pain/muscle pain.).    Yes [provider]  Amino Acids-Protein Hydrolys (FEEDING SUPPLEMENT, PRO-STAT SUGAR FREE 64,) LIQD Take 30 mLs by mouth 2 (two) times daily. Patient not taking: Reported on 03/28/2018 01/13/18   Barry Dienes,  Dorris Fetch, MD  magnesium oxide (MAG-OX) 400 (241.3 Mg) MG tablet Take 1 tablet (400 mg total) by mouth 2 (two) times daily. Patient not taking: Reported on 03/28/2018 01/13/18   Stark Klein, MD    Physical Exam: Vitals:   03/29/18 1054 03/29/18 1056 03/29/18 1101 03/29/18 1130  BP:  118/89  129/67  Pulse:  98  92  Resp:  16  14  Temp:  98.8 F (37.1 C)    TempSrc:  Oral    SpO2: 99% 100%  99%  Weight:   63.5 kg (140 lb)      Constitutional: NAD, calm, comfortable Eyes: PERRL, lids and conjunctivae normal ENMT: Mucous membranes are moist. Posterior pharynx clear of any exudate or lesions.Normal dentition.  Neck: normal, supple, no masses, no thyromegaly Respiratory: clear to auscultation bilaterally, no wheezing, no crackles. Normal respiratory effort. No accessory muscle use.  Cardiovascular: Regular rate and rhythm, no murmurs / rubs / gallops. No extremity edema Abdomen: no tenderness, no masses palpated. No hepatosplenomegaly. Bowel sounds positive.  Musculoskeletal: no clubbing / cyanosis. No joint deformity upper and lower extremities. Good ROM, no contractures. Normal muscle tone.  Skin: no rashes, lesions, ulcers. No induration Neurologic: CN 2-12 grossly intact. Strength 5/5 in all 4. Speech clear.  Psychiatric:  Normal judgment and insight. Alert and oriented x 3. Normal mood.   Labs on Admission: I have personally reviewed following labs and imaging studies  CBC: Recent Labs  Lab 03/23/18 0926 03/29/18 1200  WBC 2.7* 0.9*  NEUTROABS 0.9* 0.4  HGB 10.2* 8.8*  HCT 31.3* 27.6*  MCV 92.1 89.9  PLT 522* 412   Basic Metabolic Panel: Recent Labs  Lab 03/23/18 0926 03/29/18 1200  NA 139 138  K 3.4* 2.7*  CL 107 104  CO2 24 26  GLUCOSE 108 141*  BUN 8 8  CREATININE 0.85 1.06*  CALCIUM 9.6 8.9  MG  --  1.4*   GFR: Estimated Creatinine Clearance: 41.2 mL/min (A) (by C-G formula based on SCr of 1.06 mg/dL (H)). Liver Function Tests: Recent Labs  Lab 03/23/18 0926 03/29/18 1200  AST 217* 172*  ALT 110* 82*  ALKPHOS 143 92  BILITOT 0.5 1.1  PROT 7.6 7.3  ALBUMIN 3.1* 2.9*   No results for input(s): LIPASE, AMYLASE in the last 168 hours. No results for input(s): AMMONIA in the last 168 hours. Coagulation Profile: No results for input(s): INR, PROTIME in the last 168 hours. Cardiac Enzymes: No results for input(s): CKTOTAL, CKMB, CKMBINDEX, TROPONINI in the last 168 hours. BNP (last 3 results) No results for input(s): PROBNP in the last 8760 hours. HbA1C: No results for input(s): HGBA1C in the last 72 hours. CBG: No results for input(s): GLUCAP in the last 168 hours. Lipid Profile: No results for input(s): CHOL, HDL, LDLCALC, TRIG, CHOLHDL, LDLDIRECT in the last 72 hours. Thyroid Function Tests: No results for input(s): TSH, T4TOTAL, FREET4, T3FREE, THYROIDAB in the last 72 hours. Anemia Panel: No results for input(s): VITAMINB12, FOLATE, FERRITIN, TIBC, IRON, RETICCTPCT in the last 72 hours. Urine analysis:    Component Value Date/Time   COLORURINE YELLOW 01/10/2018 1500   APPEARANCEUR HAZY (A) 01/10/2018 1500   LABSPEC 1.017 01/10/2018 1500   PHURINE 6.0 01/10/2018 1500   GLUCOSEU NEGATIVE 01/10/2018 1500   HGBUR SMALL (A) 01/10/2018 1500   BILIRUBINUR NEGATIVE  01/10/2018 1500   KETONESUR NEGATIVE 01/10/2018 1500   PROTEINUR NEGATIVE 01/10/2018 1500   NITRITE NEGATIVE 01/10/2018 1500   LEUKOCYTESUR LARGE (A) 01/10/2018 1500  Sepsis Labs: !!!!!!!!!!!!!!!!!!!!!!!!!!!!!!!!!!!!!!!!!!!! @LABRCNTIP (procalcitonin:4,lacticidven:4) )No results found for this or any previous visit (from the past 240 hour(s)).   Radiological Exams on Admission: No results found.  EKG: Independently reviewed. Normal sinus rhythm with PAC  Assessment/Plan Principal Problem:   Generalized weakness Active Problems:   Hyperlipidemia   Pancreatic mass   Hypokalemia   Adenocarcinoma of head of pancreas (HCC)   Chemotherapy induced neutropenia (HCC)   Nausea & vomiting   Diarrhea   Generalized weakness -Supportive care as below -PT OT eval   Nausea, vomiting, diarrhea -Likely secondary to chemotherapy -Antiemetics prn -IVF   Hypokalemia -Replace, trend   Hypomagnesemia -Replace, trend   Metastatic pancreatic cancer -Follows with Dr. Burr Medico  Neutropenia without fever -Blood cultures obtained in ED -Monitor CBC -No antibiotics for now   Recently dx PE -On xarelto   Transaminitis -Possibly related to chemotherapy, as noted by Dr. Ernestina Penna note -Stable from previous labs  -Monitor   HTN -Continue Norvasc, Tenormin   DVT prophylaxis: Xarelto Code Status: Full.  Discussed with patient at time of admission.  We discussed traumatic and aggressive nature of full code status including CPR, defibrillation, mechanical ventilation.  We also discussed patient's incurable nature of metastatic pancreatic cancer and her chemotherapy is palliative in nature per Dr. Ernestina Penna note.  Patient repeated to me numerous times that she wishes to remain full code at this time. Family Communication: No family at bedside  Disposition Plan: Pending improvement in clinical status Consults called: Added Dr. Burr Medico to treatment care team   Admission status: Inpatient   * I  certify that at the point of admission it is my clinical judgment that the patient will require inpatient hospital care spanning beyond 2 midnights from the point of admission due to high intensity of service, high risk for further deterioration and high frequency of surveillance required.*   Dessa Phi, DO [facility] [email or web address] Password [email or web address] 03/29/2018, 1:33 PM

## 2018-03-29 NOTE — ED Provider Notes (Addendum)
[place] DEPT Provider Note   CSN: [id number] Arrival date & time: 03/29/18  1048     History   Chief Complaint Chief Complaint  Patient presents with  . Emesis  . Diarrhea    HPI Lauren Richard is a 82 y.o. female.  HPI  82 year old female with history of stage IV pancreatic cancer, hypertension, hyperlipidemia comes in with her family because of weakness.  Patient's last chemo session was about a week ago.  According to family, patient's chemo dose was increased last week because her cancer has spread according to the last imaging report.  Since the last chemo patient has had reduced appetite and reduced energy.  Patient also has been having multiple rounds of loose stools.  Patient denies any emesis, however she does have nausea and reports that she normally is able to walk around the house, however now she needs help even getting out of the bed.  Of note, family reports that patient is unaware of patient's cancer progression and prognosis.  Patient is full code.  Past Medical History:  Diagnosis Date  . Adenocarcinoma of head of pancreas ([place]) 11/24/2017  . Arthritis    "all over" (04/26/2017); knees, shoulder  . Essential hypertension 03/03/2017  . Family history of breast cancer   . Family history of kidney cancer   . Family history of pancreatic cancer   . Family history of prostate cancer   . History of blood transfusion 1979   "when I had my hysterectomy"  . Hyperlipidemia   . Hypothyroidism   . Obesity 03/03/2017  . Osteoarthritis 03/03/2017  . Pneumonia    as a child    Patient Active Problem List   Diagnosis Date Noted  . Chemotherapy induced neutropenia ([place]) 03/23/2018  . Family history of pancreatic cancer   . Family history of breast cancer   . Family history of kidney cancer   . Family history of prostate cancer   . Goals of care, counseling/discussion 03/09/2018  . Acute pulmonary embolism ([place]) 03/09/2018  . S/P  laparoscopy 01/05/2018  . Adenocarcinoma of head of pancreas ([place]) 11/24/2017  . Acute kidney injury superimposed on chronic kidney disease ([place])   . Hypokalemia   . Anemia   . Biliary obstruction   . Acute kidney injury ([place]) 11/03/2017  . Pancreatic mass 11/03/2017  . Osteoarthritis of left knee 04/26/2017  . S/P total knee replacement using cement, left 04/26/2017  . Essential hypertension 03/03/2017  . Hyperlipidemia 03/03/2017  . Osteoarthritis 03/03/2017  . Obesity 03/03/2017  . Status post left foot surgery 12/03/2015  . Hammertoe 11/03/2015    Past Surgical History:  Procedure Laterality Date  . BREAST CYST EXCISION Right    "cut 6 cysts out"  . CYST EXCISION Left    "off my toe"  . DILATION AND CURETTAGE OF UTERUS     "before hysterectomy"  . ERCP N/A 11/04/2017   Procedure: ENDOSCOPIC RETROGRADE CHOLANGIOPANCREATOGRAPHY (ERCP);  Surgeon: Carol Ada, MD;  Location: [place];  Service: Endoscopy;  Laterality: N/A;  . EUS N/A 11/11/2017   Procedure: UPPER ENDOSCOPIC ULTRASOUND (EUS) LINEAR;  Surgeon: Carol Ada, MD;  [facility];  Service: Endoscopy;  Laterality: N/A;  . FINE NEEDLE ASPIRATION N/A 11/11/2017   Procedure: FINE NEEDLE ASPIRATION (FNA) LINEAR;  Surgeon: Carol Ada, MD;  [facility];  Service: Endoscopy;  Laterality: N/A;  . INGUINAL HERNIA REPAIR Right   . JOINT REPLACEMENT    . LAPAROSCOPY N/A 01/05/2018  Procedure: LAPAROSCOPY DIAGNOSTIC ERAS PATHWAY;  Surgeon: Stark Klein, MD;  Location: [place];  Service: General;  Laterality: N/A;  [place]  . REDUCTION MAMMAPLASTY Bilateral 1980  . TONSILLECTOMY     "when I small"  . TOTAL ABDOMINAL HYSTERECTOMY  1979  . TOTAL KNEE ARTHROPLASTY Right 04/26/2017  . TOTAL KNEE ARTHROPLASTY Left 04/26/2017   Procedure: LEFT TOTAL KNEE ARTHROPLASTY;  Surgeon: [name], MD;  Location: [place];  Service: Orthopedics;  Laterality: Left;  . WHIPPLE PROCEDURE N/A 01/05/2018   Procedure:  WHIPPLE PROCEDURE;  Surgeon: Stark Klein, MD;  Location: [place];  Service: General;  Laterality: N/A;     OB History   None      Home Medications    Prior to Admission medications   Medication Sig Start Date End Date Taking? Authorizing Provider  amLODipine (NORVASC) 5 MG tablet Take 5 mg by mouth daily.  08/12/16  Yes [provider]  aspirin 81 MG tablet Take 81 mg by mouth daily.   Yes [provider]  atenolol (TENORMIN) 25 MG tablet Take 1 tablet (25 mg total) by mouth daily. 11/07/17  Yes Hongalgi, Lenis Dickinson, MD  Cholecalciferol (VITAMIN D) 2000 units tablet Take 2,000 Units by mouth daily.   Yes [provider]  gabapentin (NEURONTIN) 100 MG capsule Take 2 capsules (200 mg total) by mouth 2 (two) times daily. 01/13/18  Yes Stark Klein, MD  mirtazapine (REMERON) 7.5 MG tablet Take 1 tablet (7.5 mg total) by mouth at bedtime. 03/21/18  Yes Alla Feeling, NP  nystatin (MYCOSTATIN) 100000 UNIT/ML suspension Take 5 mLs (500,000 Units total) by mouth 4 (four) times daily. Patient taking differently: Take 5 mLs by mouth 4 (four) times daily as needed (for mouth soreness).  03/24/18  Yes Alla Feeling, NP  ondansetron (ZOFRAN) 8 MG tablet Take 8 mg by mouth 2 (two) times daily as needed for nausea/vomiting. 03/23/18  Yes [provider]  pantoprazole (PROTONIX) 40 MG tablet Take 1 tablet (40 mg total) by mouth daily at 12 noon. 01/13/18  Yes Stark Klein, MD  potassium chloride SA (K-DUR,KLOR-CON) 20 MEQ tablet Take 2 tablets (40 mEq total) by mouth daily. Patient taking differently: Take 20 mEq by mouth daily.  01/13/18  Yes Stark Klein, MD  PRESCRIPTION MEDICATION Apply 1 application topically 4 (four) times daily as needed (for back pain). Dic 3%, Bac 2%, lid - pril 2-5%, Menth 1% cream    Yes [provider]  prochlorperazine (COMPAZINE) 10 MG tablet Take 10 mg by mouth every 6 (six) hours as needed for nausea or vomiting.   Yes [provider]  Rivaroxaban 15 & 20 MG TBPK Take as directed on package: Start with one 15mg  tablet by mouth twice a day with food. On Day 22, switch to one 20mg  tablet once a day with food. Patient taking differently: Take 15-20 mg by mouth See admin instructions. Take as directed on package: Start with one 15mg  tablet by mouth twice a day with food. On Day 22, switch to one 20mg  tablet once a day with food. 03/09/18  Yes Truitt Merle, MD  traMADol (ULTRAM) 50 MG tablet Take 50 mg by mouth every 12 (twelve) hours as needed (for pain.).   Yes [provider]  trolamine salicylate (ASPERCREME) 10 % cream Apply 1 application topically 4 (four) times daily as needed (for knee pain/muscle pain.).    Yes [provider]  Amino Acids-Protein Hydrolys (FEEDING SUPPLEMENT, PRO-STAT SUGAR FREE  64,) LIQD Take 30 mLs by mouth 2 (two) times daily. Patient not taking: Reported on 03/28/2018 01/13/18   Stark Klein, MD  magnesium oxide (MAG-OX) 400 (241.3 Mg) MG tablet Take 1 tablet (400 mg total) by mouth 2 (two) times daily. Patient not taking: Reported on 03/28/2018 01/13/18   Stark Klein, MD    Family History Family History  Problem Relation Age of Onset  . Kidney cancer Mother 62  . Lung cancer Father 95       lung, smoker  . Congestive Heart Failure Sister   . Asthma Sister   . Atrial fibrillation Sister   . Hypertension Sister   . Diabetes Sister   . Head & neck cancer Paternal Grandmother   . Cancer Maternal Uncle        type unk  . Pancreatic cancer Paternal Aunt        dx >50  . Cancer Paternal Uncle        type unk  . Breast cancer Cousin 58  . Prostate cancer Other 62       had robotic surgery    Social History Social History   Tobacco Use  . Smoking status: Never Smoker  . Smokeless tobacco: Never Used  Substance Use Topics  . Alcohol use: No    Alcohol/week: 0.0 oz    Frequency: Never    Comment: occasional glass of wine   . Drug use: No     Allergies     Patient has no known allergies.   Review of Systems Review of Systems  Constitutional: Positive for activity change and fatigue. Negative for fever.  Respiratory: Negative for shortness of breath.   Cardiovascular: Negative for chest pain.  Gastrointestinal: Positive for abdominal pain, diarrhea and nausea. Negative for abdominal distention and vomiting.  Skin: Negative for rash.  Neurological: Positive for weakness.  Hematological: Bruises/bleeds easily.  All other systems reviewed and are negative.    Physical Exam Updated Vital Signs BP 129/67   Pulse 92   Temp 98.8 F (37.1 C) (Oral)   Resp 14   Wt 63.5 kg (140 lb)   SpO2 99%   BMI 21.93 kg/m   Physical Exam  Constitutional: She is oriented to person, place, and time. She appears well-developed.  HENT:  Head: Normocephalic and atraumatic.  Eyes: Pupils are equal, round, and reactive to light. EOM are normal.  Neck: Normal range of motion. Neck supple.  Cardiovascular: Normal rate.  Pulmonary/Chest: Effort normal.  Abdominal: Soft. Bowel sounds are normal. There is tenderness. There is no rebound and no guarding.  Musculoskeletal: She exhibits no edema.  Neurological: She is alert and oriented to person, place, and time.  Skin: Skin is warm and dry.  Nursing note and vitals reviewed.    ED Treatments / Results  Labs (all labs ordered are listed, but only abnormal results are displayed) Labs Reviewed  COMPREHENSIVE METABOLIC PANEL - Abnormal; Notable for the following components:      Result Value   Potassium 2.7 (*)    Glucose, Bld 141 (*)    Creatinine, Ser 1.06 (*)    Albumin 2.9 (*)    AST 172 (*)    ALT 82 (*)    GFR calc non Af Amer 48 (*)    GFR calc Af Amer 56 (*)    All other components within normal limits  CBC WITH DIFFERENTIAL/PLATELET - Abnormal; Notable for the following components:   WBC 0.9 (*)    RBC 3.07 (*)  Hemoglobin 8.8 (*)    HCT 27.6 (*)    RDW 17.6 (*)    All other  components within normal limits  CULTURE, BLOOD (ROUTINE X 2)  CULTURE, BLOOD (ROUTINE X 2)  URINE CULTURE  URIC ACID  URINALYSIS, ROUTINE W REFLEX MICROSCOPIC  MAGNESIUM  I-STAT CG4 LACTIC ACID, ED    EKG None  ED ECG REPORT   Date: 04/27/2018  Rate: 92  Rhythm: normal sinus rhythm  QRS Axis: normal  Intervals: normal  ST/T Wave abnormalities: nonspecific ST/T changes  Conduction Disutrbances:none  Narrative Interpretation:   Old EKG Reviewed: none available  I have personally reviewed the EKG tracing and agree with the computerized printout as noted.    Radiology No results found.  Procedures Procedures (including critical care time)  Medications Ordered in ED Medications  lactated ringers bolus 1,000 mL (1,000 mLs Intravenous New Bag/Given 03/29/18 1205)  potassium chloride SA (K-DUR,KLOR-CON) CR tablet 40 mEq (has no administration in time range)  potassium chloride 10 mEq in 100 mL IVPB (has no administration in time range)     Initial Impression / Assessment and Plan / ED Course  I have reviewed the triage vital signs and the nursing notes.  Pertinent labs & imaging results that were available during my care of the patient were reviewed by me and considered in my medical decision making (see chart for details).  Clinical Course as of Mar 30 1303  Wed Mar 29, 2018  1302 Patient is neutropenic -  which is new.  WBC(!!): 0.9 [AN]  1302 Potassium is 2.7.  We will add magnesium to her work-up.  Potassium(!!): 2.7 [AN]    Clinical Course User Index [AN] Varney Biles, MD    82 year old female comes in with chief complaint of generalized weakness. Patient has advanced pancreatic cancer, PE history on Xarelto.  She has been having worsening of her weakness after last round of chemo session, at which time higher dose of chemotherapy was utilized because of her worsening cancer.  History is positive for worsening diarrhea and nausea without emesis along with  anorexia.  It seems like her functional status has deteriorated tremendously as well.  Review of system is negative for any direct source of infection and patient denies any fevers or chills.  Anticipate admission at this time. Family is apprehensive about palliative care consult at this time, since patient is unaware of her disease.  They are open to admitting team having the conversation with the patient about CODE STATUS.  Final Clinical Impressions(s) / ED Diagnoses   Final diagnoses:  Acute hypokalemia  Dehydration  Failure to thrive in adult  Neutropenia, unspecified type [facility])    ED Discharge Orders    None       Varney Biles, MD 03/29/18 [place], Rajvi Armentor, MD 03/29/18 [place], Cathyann Kilfoyle, MD 04/27/18 1700

## 2018-03-29 NOTE — ED Notes (Signed)
ED TO INPATIENT HANDOFF REPORT  Name/Age/Gender Lauren Richard 82 y.o. female  Code Status Code Status History    Date Active Date Inactive Code Status Order ID Comments User Context   01/05/2018 1454 01/14/2018 2035 Full Code [id number]  Stark Klein, MD Inpatient   11/03/2017 2044 11/06/2017 1851 Full Code [id number]  Mariel Aloe, MD Inpatient   04/26/2017 1147 04/28/2017 2312 Full Code [id number]  Cherylann Ratel, PA-C Inpatient      Home/SNF/Other Home  Chief Complaint emesis / diarrhea   Level of Care/Admitting Diagnosis ED Disposition    ED Disposition Condition [facility] Area: [facility] [[id number]]  Level of Care: Telemetry [5]  Admit to tele based on following criteria: Other see comments  Comments: hypokalemia  Diagnosis: Failure to thrive in adult [358490]  Admitting Physician: Dessa Phi [[id number]]  Attending Physician: Dessa Phi [id number]  Estimated length of stay: past midnight tomorrow  Certification:: I certify this patient will need inpatient services for at least 2 midnights  PT Class (Do Not Modify): Inpatient [101]  PT Acc Code (Do Not Modify): Private [1]       Medical History Past Medical History:  Diagnosis Date  . Adenocarcinoma of head of pancreas ([place]) 11/24/2017  . Arthritis    "all over" (04/26/2017); knees, shoulder  . Essential hypertension 03/03/2017  . Family history of breast cancer   . Family history of kidney cancer   . Family history of pancreatic cancer   . Family history of prostate cancer   . History of blood transfusion 1979   "when I had my hysterectomy"  . Hyperlipidemia   . Hypothyroidism   . Obesity 03/03/2017  . Osteoarthritis 03/03/2017  . Pneumonia    as a child    Allergies No Known Allergies  IV Location/Drains/Wounds Patient Lines/Drains/Airways Status   Active Line/Drains/Airways    Name:   Placement date:   Placement time:   Site:   Days:   Peripheral IV 11/03/17  Right Wrist   11/03/17    1222    Wrist   146   Peripheral IV 01/11/18 Right;Anterior Forearm   01/11/18    1734    Forearm   77   Peripheral IV 03/29/18 Left Antecubital   03/29/18    1206    Antecubital   less than 1   Closed System Drain 1 Left Knee Accordion (Hemovac) 10 Fr.   04/26/17    0916    Knee   337   GI Stent 10 Fr.   11/04/17    1132    -   145   Incision (Closed) 01/05/18 Abdomen Other (Comment)   01/05/18    1317     83          Labs/Imaging Results for orders placed or performed during the hospital encounter of 03/29/18 (from the past 48 hour(s))  Comprehensive metabolic panel     Status: Abnormal   Collection Time: 03/29/18 12:00 PM  Result Value Ref Range   Sodium 138 135 - 145 mmol/L   Potassium 2.7 (LL) 3.5 - 5.1 mmol/L    Comment: REPEATED TO VERIFY CRITICAL RESULT CALLED TO, READ BACK BY AND VERIFIED WITH: TAI,M. RN AT 1254 03/29/18 MULLINS,T    Chloride 104 101 - 111 mmol/L   CO2 26 22 - 32 mmol/L   Glucose, Bld 141 (H) 65 - 99 mg/dL   BUN 8 6 - 20 mg/dL   Creatinine,  Ser 1.06 (H) 0.44 - 1.00 mg/dL   Calcium 8.9 8.9 - 10.3 mg/dL   Total Protein 7.3 6.5 - 8.1 g/dL   Albumin 2.9 (L) 3.5 - 5.0 g/dL   AST 172 (H) 15 - 41 U/L   ALT 82 (H) 14 - 54 U/L   Alkaline Phosphatase 92 38 - 126 U/L   Total Bilirubin 1.1 0.3 - 1.2 mg/dL   GFR calc non Af Amer 48 (L) >60 mL/min   GFR calc Af Amer 56 (L) >60 mL/min    Comment: (NOTE) The eGFR has been calculated using the CKD EPI equation. This calculation has not been validated in all clinical situations. eGFR's persistently <60 mL/min signify possible Chronic Kidney Disease.    Anion gap 8 5 - 15    Comment: Performed at [facility], [place] [address], [place], [place] [address]  CBC with Differential     Status: Abnormal   Collection Time: 03/29/18 12:00 PM  Result Value Ref Range   WBC 0.9 (LL) 4.0 - 10.5 K/uL    Comment: REPEATED TO VERIFY CRITICAL RESULT CALLED TO, READ BACK BY AND  VERIFIED WITH: E.Arham Symmonds RN [phone] [id number] A.QUIZON    RBC 3.07 (L) 3.87 - 5.11 MIL/uL   Hemoglobin 8.8 (L) 12.0 - 15.0 g/dL   HCT 27.6 (L) 36.0 - 46.0 %   MCV 89.9 78.0 - 100.0 fL   MCH 28.7 26.0 - 34.0 pg   MCHC 31.9 30.0 - 36.0 g/dL   RDW 17.6 (H) 11.5 - 15.5 %   Platelets 172 150 - 400 K/uL   Neutrophils Relative % 46 %   Neutro Abs 0.4 1.7 - 7.7 K/uL   Lymphocytes Relative 51 %   Lymphs Abs 0.5 0.7 - 4.0 K/uL   Monocytes Relative 2 %   Monocytes Absolute 0.0 0.1 - 1.0 K/uL   Eosinophils Relative 0 %   Eosinophils Absolute 0.0 0.0 - 0.7 K/uL   Basophils Relative 1 %   Basophils Absolute 0.0 0.0 - 0.1 K/uL    Comment: Performed at [facility], [place] [address], [place], [place] [address]  Uric acid     Status: None   Collection Time: 03/29/18 12:00 PM  Result Value Ref Range   Uric Acid, Serum 4.8 2.3 - 6.6 mg/dL    Comment: Performed at [facility], [place] [address], [place], [place] [address]  Magnesium     Status: Abnormal   Collection Time: 03/29/18 12:00 PM  Result Value Ref Range   Magnesium 1.4 (L) 1.7 - 2.4 mg/dL    Comment: Performed at [facility], [place] [address], [place],  [address]  I-Stat CG4 Lactic Acid, ED  (not at  [facility])     Status: Abnormal   Collection Time: 03/29/18  1:19 PM  Result Value Ref Range   Lactic Acid, Venous 2.84 (HH) 0.5 - 1.9 mmol/L   Comment NOTIFIED PHYSICIAN    No results found.  Pending Labs Unresulted Labs (From admission, onward)   Start     Ordered   03/29/18 1239  Urine culture  STAT,   STAT     03/29/18 1238   03/29/18 1238  Blood Culture (routine x 2)  BLOOD CULTURE X 2,   STAT     03/29/18 1238   03/29/18 1135  Urinalysis, Routine w reflex microscopic  STAT,   STAT     03/29/18 1134   Signed and Held  CBC  Tomorrow  morning,   R     Signed and Held   Signed and Held  Comprehensive metabolic panel  Tomorrow morning,   R     Signed and Held   Signed and Held   Magnesium  Tomorrow morning,   R     Signed and Held      Vitals/Pain Today's Vitals   03/29/18 1054 03/29/18 1056 03/29/18 1101 03/29/18 1130  BP:  118/89  129/67  Pulse:  98  92  Resp:  16  14  Temp:  98.8 F (37.1 C)    TempSrc:  Oral    SpO2: 99% 100%  99%  Weight:   140 lb (63.5 kg)   PainSc:   0-No pain     Isolation Precautions Protective Precautions  Medications Medications  potassium chloride SA (K-DUR,KLOR-CON) CR tablet 40 mEq (40 mEq Oral Not Given 03/29/18 1409)  potassium chloride 10 mEq in 100 mL IVPB (10 mEq Intravenous New Bag/Given 03/29/18 1401)  magnesium sulfate IVPB 2 g 50 mL (has no administration in time range)  lactated ringers bolus 1,000 mL (0 mLs Intravenous Stopped 03/29/18 1408)    Mobility walks with person assist

## 2018-03-29 NOTE — ED Notes (Signed)
Nevin Bloodgood (Niece) [phone]

## 2018-03-29 NOTE — ED Triage Notes (Signed)
Pt arrives via EMS from home. Pt reports N/V/D for 2 days. Pt had surgery 2 months ago for pancreatic cancer. Pt receiving chemo currently. Pt reports increased fatigue.

## 2018-03-29 NOTE — Progress Notes (Signed)
ANTICOAGULATION CONSULT NOTE - Initial Consult  Pharmacy Consult for Xarelto Indication: pulmonary embolus  No Known Allergies  Patient Measurements: Weight: 140 lb (63.5 kg)  Vital Signs: Temp: 98.8 F (37.1 C) (06/19 1056) Temp Source: Oral (06/19 1056) BP: 129/67 (06/19 1130) Pulse Rate: 92 (06/19 1130)  Labs: Recent Labs    03/29/18 1200  HGB 8.8*  HCT 27.6*  PLT 172  CREATININE 1.06*    Estimated Creatinine Clearance: 41.2 mL/min (A) (by C-G formula based on SCr of 1.06 mg/dL (H)).   Medical History: Past Medical History:  Diagnosis Date  . Adenocarcinoma of head of pancreas ([place]) 11/24/2017  . Arthritis    "all over" (04/26/2017); knees, shoulder  . Essential hypertension 03/03/2017  . Family history of breast cancer   . Family history of kidney cancer   . Family history of pancreatic cancer   . Family history of prostate cancer   . History of blood transfusion 1979   "when I had my hysterectomy"  . Hyperlipidemia   . Hypothyroidism   . Obesity 03/03/2017  . Osteoarthritis 03/03/2017  . Pneumonia    as a child    Medications:  Scheduled:  . potassium chloride  40 mEq Oral Once    Assessment: 69 yoF admitted on 6/19 for weakness, N/V/D likely related to chemotherapy for pancreatic cancer.  She is on Xarelto at home for recent PE.  Pharmacy is consulted to resume dosing inpatient.  PTA Xarelto 15 mg, last dose taken on 6/19 at 0730.  She states that she has 5 doses of the 15 mg tablets left in her starter pack.   SCr 1.06, CrCl ~ 41 ml/min CBC Hgb decreased to 8.8, Plt 172 No bleeding or complications noted   Goal of Therapy:  Monitor platelets by anticoagulation protocol: Yes   Plan:  Xarelto 15 mg PO BID x 5 more doses to complete initial 21 days. Follow with Xarelto 20mg  PO daily with supper.  Gretta Arab PharmD, BCPS Pager [phone] 03/29/2018 1:37 PM

## 2018-03-29 NOTE — ED Notes (Signed)
Pt is placed on pure wick at this time.

## 2018-03-29 NOTE — ED Notes (Signed)
CRITICAL VALUE STICKER  CRITICAL VALUE: Postassium  RECEIVER (on-site recipient of call): Airrion [place], [place] NOTIFIED: 03/29/18 @ 12:55  MESSENGER (representative from lab): Tammy  MD NOTIFIED: Dr. Kathrynn Humble  TIME OF NOTIFICATION: 12:56  RESPONSE: Waiting for orders.

## 2018-03-29 NOTE — ED Notes (Signed)
Dr. Kathrynn Humble notified of pt critical WBC of 0.9

## 2018-03-29 NOTE — ED Notes (Signed)
Pt had drawn for labs: Gold x2 Blue Lt green Lavender Dark green x2

## 2018-03-30 ENCOUNTER — Encounter: Payer: PPO | Admitting: Nutrition

## 2018-03-30 ENCOUNTER — Inpatient Hospital Stay: Payer: PPO

## 2018-03-30 ENCOUNTER — Telehealth: Payer: Self-pay | Admitting: Hematology

## 2018-03-30 ENCOUNTER — Other Ambulatory Visit: Payer: PPO

## 2018-03-30 ENCOUNTER — Telehealth: Payer: Self-pay

## 2018-03-30 ENCOUNTER — Ambulatory Visit: Payer: PPO

## 2018-03-30 DIAGNOSIS — E7800 Pure hypercholesterolemia, unspecified: Secondary | ICD-10-CM

## 2018-03-30 DIAGNOSIS — I10 Essential (primary) hypertension: Secondary | ICD-10-CM

## 2018-03-30 DIAGNOSIS — K869 Disease of pancreas, unspecified: Secondary | ICD-10-CM

## 2018-03-30 DIAGNOSIS — D701 Agranulocytosis secondary to cancer chemotherapy: Secondary | ICD-10-CM

## 2018-03-30 DIAGNOSIS — R531 Weakness: Principal | ICD-10-CM

## 2018-03-30 DIAGNOSIS — R740 Nonspecific elevation of levels of transaminase and lactic acid dehydrogenase [LDH]: Secondary | ICD-10-CM

## 2018-03-30 DIAGNOSIS — R112 Nausea with vomiting, unspecified: Secondary | ICD-10-CM

## 2018-03-30 DIAGNOSIS — Z86711 Personal history of pulmonary embolism: Secondary | ICD-10-CM

## 2018-03-30 DIAGNOSIS — C799 Secondary malignant neoplasm of unspecified site: Secondary | ICD-10-CM

## 2018-03-30 DIAGNOSIS — E876 Hypokalemia: Secondary | ICD-10-CM

## 2018-03-30 DIAGNOSIS — C259 Malignant neoplasm of pancreas, unspecified: Secondary | ICD-10-CM

## 2018-03-30 DIAGNOSIS — R197 Diarrhea, unspecified: Secondary | ICD-10-CM

## 2018-03-30 DIAGNOSIS — D6481 Anemia due to antineoplastic chemotherapy: Secondary | ICD-10-CM

## 2018-03-30 DIAGNOSIS — C250 Malignant neoplasm of head of pancreas: Secondary | ICD-10-CM

## 2018-03-30 DIAGNOSIS — T451X5A Adverse effect of antineoplastic and immunosuppressive drugs, initial encounter: Secondary | ICD-10-CM

## 2018-03-30 LAB — COMPREHENSIVE METABOLIC PANEL
ALBUMIN: 2.5 g/dL — AB (ref 3.5–5.0)
ALK PHOS: 78 U/L (ref 38–126)
ALT: 70 U/L — ABNORMAL HIGH (ref 14–54)
ANION GAP: 7 (ref 5–15)
AST: 146 U/L — ABNORMAL HIGH (ref 15–41)
BUN: 6 mg/dL (ref 6–20)
CALCIUM: 8.6 mg/dL — AB (ref 8.9–10.3)
CO2: 25 mmol/L (ref 22–32)
Chloride: 109 mmol/L (ref 101–111)
Creatinine, Ser: 0.79 mg/dL (ref 0.44–1.00)
GFR calc Af Amer: >60 mL/min (ref >60)
GFR calc non Af Amer: >60 mL/min (ref >60)
GLUCOSE: 106 mg/dL — AB (ref 65–99)
POTASSIUM: 2.9 mmol/L — AB (ref 3.5–5.1)
SODIUM: 141 mmol/L (ref 135–145)
Total Bilirubin: 1 mg/dL (ref 0.3–1.2)
Total Protein: 6.4 g/dL — ABNORMAL LOW (ref 6.5–8.1)

## 2018-03-30 LAB — CBC
HCT: 24.7 % — ABNORMAL LOW (ref 36.0–46.0)
HEMOGLOBIN: 8.1 g/dL — AB (ref 12.0–15.0)
MCH: 29.8 pg (ref 26.0–34.0)
MCHC: 32.8 g/dL (ref 30.0–36.0)
MCV: 90.8 fL (ref 78.0–100.0)
Platelets: 154 10*3/uL (ref 150–400)
RBC: 2.72 MIL/uL — ABNORMAL LOW (ref 3.87–5.11)
RDW: 17.9 % — ABNORMAL HIGH (ref 11.5–15.5)
WBC: 1.7 10*3/uL — ABNORMAL LOW (ref 4.0–10.5)

## 2018-03-30 LAB — LACTIC ACID, PLASMA
Lactic Acid, Venous: 1.6 mmol/L (ref 0.5–1.9)
Lactic Acid, Venous: 3.3 mmol/L (ref 0.5–1.9)
Lactic Acid, Venous: 1.4 mmol/L (ref 0.5–1.9)
Lactic Acid, Venous: 1.5 mmol/L (ref 0.5–1.9)

## 2018-03-30 LAB — URINE CULTURE

## 2018-03-30 LAB — MAGNESIUM: Magnesium: 1.9 mg/dL (ref 1.7–2.4)

## 2018-03-30 MED ORDER — MIRTAZAPINE 15 MG PO TABS: 7.5000 mg | ORAL_TABLET | Freq: Every day | ORAL | Status: DC

## 2018-03-30 MED ORDER — TBO-FILGRASTIM 300 MCG/0.5ML ~~LOC~~ SOSY
PREFILLED_SYRINGE | SUBCUTANEOUS | Status: AC
  Filled 2018-03-30: qty 0.5

## 2018-03-30 MED ORDER — POTASSIUM CHLORIDE 10 MEQ/100ML IV SOLN
INTRAVENOUS | Status: AC
  Filled 2018-03-30: qty 100

## 2018-03-30 MED ORDER — SODIUM CHLORIDE 0.9 % IV BOLUS
1000.0000 mL | Freq: Once | INTRAVENOUS | Status: AC
  Administered 2018-03-30: 1000 mL via INTRAVENOUS

## 2018-03-30 MED ORDER — POTASSIUM CHLORIDE 20 MEQ/15ML (10%) PO SOLN
40.0000 meq | Freq: Two times a day (BID) | ORAL | Status: AC
  Administered 2018-03-30 (×2): 40 meq via ORAL
  Filled 2018-03-30 (×2): qty 30

## 2018-03-30 MED ORDER — POTASSIUM CHLORIDE 10 MEQ/100ML IV SOLN
10.0000 meq | INTRAVENOUS | Status: AC
  Administered 2018-03-30 (×4): 10 meq via INTRAVENOUS
  Filled 2018-03-30 (×3): qty 100

## 2018-03-30 NOTE — Clinical Social Work Note (Signed)
Clinical Social Work Assessment  Patient Details  Name: Lauren Richard MRN: [id number] Date of Birth: 14-Feb-1936  Date of referral:  03/30/18               Reason for consult:  Facility Placement                Permission sought to share information with:  Facility Sport and exercise psychologist, Family Supports Permission granted to share information::  Yes, Verbal Permission Granted  Name::     Lauren Richard  Agency::     Relationship::  niece  Contact Information:  [phone]  Housing/Transportation Living arrangements for the past 2 months:  Apartment Source of Information:  Patient, Adult Children Patient Interpreter Needed:  None Criminal Activity/Legal Involvement Pertinent to Current Situation/Hospitalization:  No - Comment as needed Significant Relationships:  Other Family Members, Siblings Lives with:  Self Do you feel safe going back to the place where you live?  (PT recommending SNF vs HHPT) Need for family participation in patient care:  Yes (Comment)  Care giving concerns: Patient from home alone. Patient reported that she is independent with ADLS and ambulation at baseline. Patient reported that they recently increased her chemotherapy dose and that she became weak and has to use a walker. PT recommending SNF vs HHPT.    Social Worker assessment / plan:  CSW spoke with patient at bedside regarding PT recommendation for SNF vs HHPT. Patient reported that she was recently at [facility] in March 2019 after having "whipple surgery". Patient reported that she would prefer to return home with home health services if possible. Patient reported that her niece is going out of town next week and requested that [place] speak with patient's niece about discharge planning. Patient's niece Lauren Richard) joined patient in the room. CSW and patient's niece discussed PT recommendation SNF vs HHPT. Patient's niece reported that patient is not at baseline and reported that she is not available for the next week to assist  patient at home. Patient's niece reported that they prefer patient go to SNF for ST rehab, patient agreeable. CSW explained SNF placement process and insurance authorization, patient/patient's niece verbalized understanding. Patient reported that she preferred to return to [facility]. CSW agreed to complete FL2 and start patient's insurance authorization.   CSW completed patient's FL2 and will follow up with [facility]. CSW started patient's insurance authorization.   CSW will continue to follow and assist with discharge planning.   Employment status:  Retired Nurse, adult PT Recommendations:  [place], Home with [place] / Referral to community resources:  [place]  Patient/Family's Response to care:  Patient/patient's niece appreciative of CSW assistance with discharge planning.   Patient/Family's Understanding of and Emotional Response to Diagnosis, Current Treatment, and Prognosis:  Patient presented calm and verbalized understanding of current treatment plan.PAtient/patient's niece verbalized plan for patient to dc to SNF for ST rehab.   Emotional Assessment Appearance:  Appears stated age Attitude/Demeanor/Rapport:  Other(cooperative) Affect (typically observed):  Calm, Appropriate Orientation:  Oriented to Self, Oriented to Situation, Oriented to Place, Oriented to  Time Alcohol / Substance use:  Not Applicable Psych involvement (Current and /or in the community):  No (Comment)  Discharge Needs  Concerns to be addressed:  Care Coordination Readmission within the last 30 days:  No Current discharge risk:  Physical Impairment, Lives alone Barriers to Discharge:  Continued Medical Work up, [employer], LCSW 03/30/2018, 3:49 PM

## 2018-03-30 NOTE — Progress Notes (Signed)
CRITICAL VALUE ALERT  Critical Value:  Lactic Acid 3.3  Date & Time Notied:  6/20 at 1215  Provider Notified: Alfredia Ferguson   Orders Received/Actions taken: Awaiting response.

## 2018-03-30 NOTE — Progress Notes (Signed)
Lauren Richard   DOB:08-02-1936   GG#:[id number]   VOJ#:[id number]  Oncology f/u note   Subjective: Patient is well-known to me, under my care for her metastatic pancreatic cancer.  She recently started more intensive chemotherapy Abraxane and gemcitabine last week, developed significant fatigue, low appetite, diarrhea and was admitted to hospital yesterday.  She feels little better today, still has poor appetite.   Objective:  Vitals:   03/30/18 1045 03/30/18 1341  BP: 129/73   Pulse: 98 87  Resp:  16  Temp:  98.8 F (37.1 C)  SpO2:  99%    Body mass index is 21.93 kg/m.  Intake/Output Summary (Last 24 hours) at 03/30/2018 1707 Last data filed at 03/30/2018 1650 Gross per 24 hour  Intake 3596.66 ml  Output 0 ml  Net 3596.66 ml     Sclerae unicteric  Oropharynx clear  No peripheral adenopathy  Lungs clear -- no rales or rhonchi  Heart regular rate and rhythm  Abdomen benign  MSK no focal spinal tenderness, no peripheral edema  Neuro nonfocal   CBG (last 3)  No results for input(s): GLUCAP in the last 72 hours.   Labs:  Lab Results  Component Value Date   WBC 1.7 (L) 03/30/2018   HGB 8.1 (L) 03/30/2018   HCT 24.7 (L) 03/30/2018   MCV 90.8 03/30/2018   PLT 154 03/30/2018   NEUTROABS 0.4 03/29/2018    CMP Latest Ref Rng & Units 03/30/2018 03/29/2018 03/23/2018  Glucose 65 - 99 mg/dL 106(H) 141(H) 108  BUN 6 - 20 mg/dL 6 8 8   Creatinine 0.44 - 1.00 mg/dL 0.79 1.06(H) 0.85  Sodium 135 - 145 mmol/L 141 138 139  Potassium 3.5 - 5.1 mmol/L 2.9(L) 2.7(LL) 3.4(L)  Chloride 101 - 111 mmol/L 109 104 107  CO2 22 - 32 mmol/L 25 26 24   Calcium 8.9 - 10.3 mg/dL 8.6(L) 8.9 9.6  Total Protein 6.5 - 8.1 g/dL 6.4(L) 7.3 7.6  Total Bilirubin 0.3 - 1.2 mg/dL 1.0 1.1 0.5  Alkaline Phos 38 - 126 U/L 78 92 143  AST 15 - 41 U/L 146(H) 172(H) 217(HH)  ALT 14 - 54 U/L 70(H) 82(H) 110(H)     Urine Studies No results for input(s): UHGB, CRYS in the last 72 hours.  Invalid input(s):  UACOL, UAPR, USPG, UPH, UTP, UGL, UKET, UBIL, UNIT, UROB, ULEU, UEPI, UWBC, URBC, UBAC, CAST, UCOM, BILUA  Basic Metabolic Panel: Recent Labs  Lab 03/29/18 1200 03/30/18 0539  NA 138 141  K 2.7* 2.9*  CL 104 109  CO2 26 25  GLUCOSE 141* 106*  BUN 8 6  CREATININE 1.06* 0.79  CALCIUM 8.9 8.6*  MG 1.4* 1.9   GFR Estimated Creatinine Clearance: 54.5 mL/min (by C-G formula based on SCr of 0.79 mg/dL). Liver Function Tests: Recent Labs  Lab 03/29/18 1200 03/30/18 0539  AST 172* 146*  ALT 82* 70*  ALKPHOS 92 78  BILITOT 1.1 1.0  PROT 7.3 6.4*  ALBUMIN 2.9* 2.5*   No results for input(s): LIPASE, AMYLASE in the last 168 hours. No results for input(s): AMMONIA in the last 168 hours. Coagulation profile No results for input(s): INR, PROTIME in the last 168 hours.  CBC: Recent Labs  Lab 03/29/18 1200 03/30/18 0539  WBC 0.9* 1.7*  NEUTROABS 0.4  --   HGB 8.8* 8.1*  HCT 27.6* 24.7*  MCV 89.9 90.8  PLT 172 154   Cardiac Enzymes: No results for input(s): CKTOTAL, CKMB, CKMBINDEX, TROPONINI in the last 168  hours. BNP: Invalid input(s): POCBNP CBG: No results for input(s): GLUCAP in the last 168 hours. D-Dimer No results for input(s): DDIMER in the last 72 hours. Hgb A1c No results for input(s): HGBA1C in the last 72 hours. Lipid Profile No results for input(s): CHOL, HDL, LDLCALC, TRIG, CHOLHDL, LDLDIRECT in the last 72 hours. Thyroid function studies No results for input(s): TSH, T4TOTAL, T3FREE, THYROIDAB in the last 72 hours.  Invalid input(s): FREET3 Anemia work up No results for input(s): VITAMINB12, FOLATE, FERRITIN, TIBC, IRON, RETICCTPCT in the last 72 hours. Microbiology Recent Results (from the past 240 hour(s))  Blood Culture (routine x 2)     Status: None (Preliminary result)   Collection Time: 03/29/18 12:38 PM  Result Value Ref Range Status   Specimen Description   Final    BLOOD LEFT HAND Performed at [facility], [place]  [address], [place], [place] [address]    Special Requests   Final    BOTTLES DRAWN AEROBIC AND ANAEROBIC Blood Culture results may not be optimal due to an inadequate volume of blood received in culture bottles Performed at [place] [address] [place], [place] [address]    Culture   Final    NO GROWTH < 24 HOURS Performed at [facility] [address] [place], [place] [address]    Report Status PENDING  Incomplete  Blood Culture (routine x 2)     Status: None (Preliminary result)   Collection Time: 03/29/18  3:40 PM  Result Value Ref Range Status   Specimen Description   Final    BLOOD RIGHT HAND Performed at [facility] [address], [place], [place] [address]    Special Requests   Final    BOTTLES DRAWN AEROBIC ONLY Blood Culture results may not be optimal due to an inadequate volume of blood received in culture bottles Performed at [facility] [address], [place], [place] [address]    Culture   Final    NO GROWTH < 24 HOURS Performed at [facility] [address], [place], [place] [address]    Report Status PENDING  Incomplete  Urine culture     Status: Abnormal   Collection Time: 03/29/18  5:38 PM  Result Value Ref Range Status   Specimen Description   Final    URINE, RANDOM Performed at [facility] [address] [place], [place] [address]    Special Requests   Final    NONE Performed at [facility], [place] [address] [place],  [address]    Culture MULTIPLE SPECIES PRESENT, SUGGEST RECOLLECTION (A)  Final   Report Status 03/30/2018 FINAL  Final      Studies:  No results found.  Assessment: 82 y.o. African-American female with recently diagnosed metastatic pancreatic cancer, PE, presented with profound fatigue, low appetite and diarrhea after chemo.  1.  Generalized weakness, secondary to chemotherapy 2.  Anorexia, nausea,  diarrhea, secondary to chemotherapy and underlying malignancy 3.  Neutropenia and anemia, secondary to chemotherapy 4.  Metastatic pancreatic cancer, status post chemotherapy gemcitabine and Abraxane last week 5. History of PE  6. HTN 7.  Transaminitis  Plan:  -His WBC is increasing, no differential done today.  She has a CBC with differential ordered for tomorrow morning, if ANC is still less than 1.0, will consider giving 1 dose of Granix -If hemoglobin less than 8.0 tomorrow, please consider blood transfusion 1 to  2 units -ID work-up pending, she is afebrile, not on antibiotics now -I agree with rehab, she lives alone, would benefit from rehabilitation -We again discussed the treatment option for her metastatic pancreatic cancer.  She apparently is not able to tolerate intensive chemo we discussed the option of, chemo dose reduction, single agent, oral chemotherapy Xeloda, versus palliative care alone.  Patient still prefers to continue chemo.  I encouraged her to recover well first, and I will see her back 1 week after discharge to reevaluate her candidacy for chemotherapy -For appetite, we discussed the option of appetite stimulant, such as mirtazapine, Marinol and Megace.  I will start her on mirtazapine 7.5 mg at night, to see if helps.  She agrees. -I discussed with Dr. Barry Dienes  about her port placement.  Due to her neutropenia, Dr. Barry Dienes does not think it is a good time to have port placed tomorrow.  She may postpone her port placement which is scheduled for next Wednesday. -will f/u as needed, please call me for questions.    Truitt Merle, MD 03/30/2018  5:07 PM

## 2018-03-30 NOTE — Pre-Procedure Instructions (Signed)
Lauren Richard  03/30/2018      [facility] [place] ([place]), [place] [address] [place] ([place] [address] Phone: [phone] Fax: [phone]  [place], [place] - [place] [address] Phone: [phone] Fax: [phone]    Your procedure is scheduled on June 26.  Report to [facility] Admitting at 7:00 A.M.  Call this number if you have problems the morning of surgery:  [phone]   Remember:  Do not eat or drink after midnight.       DRINK ENSURE  AT 6:00 A.M. THE MORNING OF SURGERY                         Take these medicines the morning of surgery with A SIP OF WATER :              AMLODIPINE (NORVASC)            ATENOLOL (TENORMIN)            GABAPENTIN (NEURONTIN)            ONDANSETRON (ZOFRAN) IF NEEDED            TRAMADOL IF NEEDED               STOP RIVAROXABAN (XARLETO) AS DIRECTED BY DR. BYERLY              7 days prior to surgery STOP taking any Aspirin(unless otherwise instructed by your surgeon), Aleve, Naproxen, Ibuprofen, Motrin, Advil, Goody's, BC's, all herbal medications, fish oil, and all vitamins                Do not wear jewelry, make-up or nail polish.  Do not wear lotions, powders, or perfumes, or deodorant.  Do not shave 48 hours prior to surgery.  Men may shave face and neck.  Do not bring valuables to the hospital.  [facility] is not responsible for any belongings or valuables.  Contacts, dentures or bridgework may not be worn into surgery.  Leave your suitcase in the car.  After surgery it may be brought to your room.  For patients admitted to the hospital, discharge time will be determined by your treatment team.  Patients discharged the day of surgery will not be allowed to drive home.    Special instructions:   [place]- Preparing For Surgery  Before surgery, you can play an important role.  Because skin is not sterile, your skin needs to be as free of germs as possible. You can reduce the number of germs on your skin by washing with CHG (chlorahexidine gluconate) Soap before surgery.  CHG is an antiseptic cleaner which kills germs and bonds with the skin to continue killing germs even after washing.    Oral Hygiene is also important to reduce your risk of infection.  Remember - BRUSH YOUR TEETH THE MORNING OF SURGERY WITH YOUR REGULAR TOOTHPASTE  Please do not use if you have an allergy to CHG or antibacterial soaps. If your skin becomes reddened/irritated stop using the CHG.  Do not shave (including legs and underarms) for at least 48 hours prior to first CHG shower. It is OK to shave your face.  Please follow these instructions carefully.   1. Shower the NIGHT BEFORE SURGERY and the MORNING OF SURGERY with CHG.   2. If you  chose to wash your hair, wash your hair first as usual with your normal shampoo.  3. After you shampoo, rinse your hair and body thoroughly to remove the shampoo.  4. Use CHG as you would any other liquid soap. You can apply CHG directly to the skin and wash gently with a scrungie or a clean washcloth.   5. Apply the CHG Soap to your body ONLY FROM THE NECK DOWN.  Do not use on open wounds or open sores. Avoid contact with your eyes, ears, mouth and genitals (private parts). Wash Face and genitals (private parts)  with your normal soap.  6. Wash thoroughly, paying special attention to the area where your surgery will be performed.  7. Thoroughly rinse your body with warm water from the neck down.  8. DO NOT shower/wash with your normal soap after using and rinsing off the CHG Soap.  9. Pat yourself dry with a CLEAN TOWEL.  10. Wear CLEAN PAJAMAS to bed the night before surgery, wear comfortable clothes the morning of surgery  11. Place CLEAN SHEETS on your bed the night of your first shower and DO NOT SLEEP WITH PETS.    Day of Surgery:  Do not  apply any deodorants/lotions.  Please wear clean clothes to the hospital/surgery center.   Remember to brush your teeth WITH YOUR REGULAR TOOTHPASTE.    Please read over the following fact sheets that you were given. Coughing and Deep Breathing and Surgical Site Infection Prevention

## 2018-03-30 NOTE — Evaluation (Signed)
Occupational Therapy Evaluation Patient Details Name: Lauren Richard MRN: [id number] DOB: 1936/09/02 Today's Date: 03/30/2018    History of Present Illness Lauren Richard is a 82 y.o. female with medical history significant of metastatic pancreatic cancer, HTN, GERD, recent dx PE  who presents with generalized weakness, diarrhea, weakness.   Clinical Impression   Pt was admitted for the above.  She recent had decline in  ADLs and had assistance from her niece.  She had been independent up until recently.  She needs min to mod A to stand and will benefit from SNF to restore her function. If home, she needs 24/7.  Pt has urgency/diarrhea. Will follow in acute with min guard to supervision level goals    Follow Up Recommendations  SNF(if home, 24/7 and HHOT)    Equipment Recommendations  3 in 1 bedside commode    Recommendations for Other Services       Precautions / Restrictions Precautions Precaution Comments: urgency for diarrhea, monitor HR Restrictions Weight Bearing Restrictions: No      Mobility Bed Mobility Overal bed mobility: Needs Assistance Bed Mobility: Supine to Sit     Supine to sit: Min guard     General bed mobility comments: extra time  Transfers Overall transfer level: Needs assistance Equipment used: Rolling walker (2 wheeled) Transfers: Sit to/from Stand Sit to Stand: Mod assist;Min assist         General transfer comment: mod assist to rise from bed with 2 arm hold. Assist to rise/power up. Patient stood from toilet using rail and min assist.     Balance                                           ADL either performed or assessed with clinical judgement   ADL Overall ADL's : Needs assistance/impaired Eating/Feeding: Independent   Grooming: Wash/dry hands;Min guard;Sitting   Upper Body Bathing: Set up;Sitting   Lower Body Bathing: Minimal assistance;Moderate assistance;Sit to/from stand   Upper Body Dressing :  Minimal assistance;Sitting(iv)   Lower Body Dressing: Moderate assistance;Sit to/from stand   Toilet Transfer: Minimal assistance;Moderate assistance;Ambulation;Comfort height toilet;RW;Grab bars   Toileting- Clothing Manipulation and Hygiene: Maximal assistance;Sit to/from stand         General ADL Comments: pt needed more assistance to stand the first time.  urgency with diarrhea     Vision         Perception     Praxis      Pertinent Vitals/Pain Pain Assessment: No/denies pain     Hand Dominance Right   Extremity/Trunk Assessment Upper Extremity Assessment Upper Extremity Assessment: Generalized weakness   Lower Extremity Assessment Lower Extremity Assessment: Generalized weakness   Cervical / Trunk Assessment Cervical / Trunk Assessment: Normal   Communication Communication Communication: No difficulties   Cognition Arousal/Alertness: Awake/alert Behavior During Therapy: WFL for tasks assessed/performed Overall Cognitive Status: Within Functional Limits for tasks assessed                                     General Comments       Exercises     Shoulder Instructions      Home Living Family/patient expects to be discharged to:: Private residence Living Arrangements: Alone Available Help at Discharge: Family;Available PRN/intermittently Type of Home: Apartment Home Access:  Stairs to enter [employer] of Steps: full flight  Entrance Stairs-Rails: Right;Left Home Layout: One level     Bathroom Shower/Tub: Corporate investment banker: Standard     Home Equipment: Wheelchair - [employer] - 2 wheels;Other (comment);Shower seat          Prior Functioning/Environment Level of Independence: Needs assistance        Comments: Pt does not drive, but ambulates through grocery store.   Is independent with IADLs, does not use AE .  Very recently needed ADL assistance; niece helped her.  Several sisters close  in age (one is twin) nearby        OT Problem List: Decreased strength;Decreased activity tolerance;Impaired balance (sitting and/or standing);Decreased knowledge of use of DME or AE      OT Treatment/Interventions: Self-care/ADL training;DME and/or AE instruction;Energy conservation;Patient/family education;Balance training;Therapeutic activities    OT Goals(Current goals can be found in the care plan section) Acute Rehab OT Goals Patient Stated Goal: to be independent, feel better OT Goal Formulation: With patient Time For Goal Achievement: 04/13/18 Potential to Achieve Goals: Good ADL Goals Pt Will Perform Grooming: with supervision;standing Pt Will Transfer to Toilet: ambulating;bedside commode;with min guard assist Additional ADL Goal #1: pt will complete ADL with min guard, sit to stand Additional ADL Goal #2: pt will initiate at least one rest break for energy conservation  OT Frequency: Min 2X/week   Barriers to D/C:            Co-evaluation PT/OT/SLP Co-Evaluation/Treatment: Yes Reason for Co-Treatment: For patient/therapist safety PT goals addressed during session: Mobility/safety with mobility OT goals addressed during session: ADL's and self-care      AM-PAC PT "6 Clicks" Daily Activity     Outcome Measure Help from another person eating meals?: None Help from another person taking care of personal grooming?: A Little Help from another person toileting, which includes using toliet, bedpan, or urinal?: A Lot Help from another person bathing (including washing, rinsing, drying)?: A Lot Help from another person to put on and taking off regular upper body clothing?: A Little Help from another person to put on and taking off regular lower body clothing?: A Lot 6 Click Score: 16   End of Session    Activity Tolerance: Patient tolerated treatment well Patient left: in chair;with call bell/phone within reach  OT Visit Diagnosis: Muscle weakness (generalized)  (M62.81)                Time: 6063-0160 OT Time Calculation (min): 32 min Charges:  OT General Charges $OT Visit: 1 Visit OT Evaluation $OT Eval Low Complexity: 1 Low G-Codes:     Lauren Richard, OTR/L [phone] 03/30/2018  Lauren Richard 03/30/2018, 1:23 PM

## 2018-03-30 NOTE — Telephone Encounter (Signed)
Called Feng RN and informed her that the patient was in the hospital at [facility]. Per 6/20 phon msg return

## 2018-03-30 NOTE — Evaluation (Signed)
Physical Therapy Evaluation Patient Details Name: Lauren Richard MRN: [id number] DOB: 23-Mar-1936 Today's Date: 03/30/2018   History of Present Illness  Lauren Richard is a 82 y.o. female with medical history significant of metastatic pancreatic cancer, HTN, GERD, recent dx PE  who presents with generalized weakness, diarrhea, weakness.  Clinical Impression  The patient had and urgent episode of diarrhea while mobilizing.. The ambulated x 120' with RW. PTA, patient independent without  A RW. Patient has 1 flight of stairs to apartment. HR 123 while ambulating. Pt admitted with above diagnosis. Pt currently with functional limitations due to the deficits listed below (see PT Problem List). Pt will benefit from skilled PT to increase their independence and safety with mobility to allow discharge to the venue listed below.       Follow Up Recommendations SNF vs HHPT if improves to be able to go up a flight of steps easily.    Equipment Recommendations  3in1 (PT)    Recommendations for Other Services       Precautions / Restrictions Precautions Precaution Comments: urgencency for diarrhea, monitor HR      Mobility  Bed Mobility Overal bed mobility: Needs Assistance Bed Mobility: Supine to Sit     Supine to sit: Min guard     General bed mobility comments: extra time  Transfers Overall transfer level: Needs assistance Equipment used: Rolling walker (2 wheeled) Transfers: Sit to/from Stand Sit to Stand: Mod assist;Min assist         General transfer comment: mod assist to rise from bed with 2 arm hold. Assist to rise/power up. Patient stood from toilet using rail and min assist.   Ambulation/Gait Ambulation/Gait assistance: Min assist;+2 safety/equipment Gait Distance (Feet): 10 Feet Assistive device: Rolling walker (2 wheeled) Gait Pattern/deviations: Step-through pattern     General Gait Details: patient  had urgency to go to BR. Afterwards ambulated x 120 with RW,  gait steady  Stairs            Wheelchair Mobility    Modified Rankin (Stroke Patients Only)       Balance                                             Pertinent Vitals/Pain Pain Assessment: No/denies pain    Home Living Family/patient expects to be discharged to:: Private residence Living Arrangements: Alone Available Help at Discharge: Family;Available PRN/intermittently Type of Home: Apartment Home Access: Stairs to enter Entrance Stairs-Rails: Right;Left Entrance Stairs-Number of Steps: full flight  Home Layout: One level Home Equipment: Wheelchair - [employer] - 2 wheels;Other (comment);Shower seat      Prior Function Level of Independence: Independent         Comments: Pt does not drive, but ambulates through grocery store.   Is independent with IADLs, does not use AE      Hand Dominance   Dominant Hand: Right    Extremity/Trunk Assessment        Lower Extremity Assessment Lower Extremity Assessment: Generalized weakness    Cervical / Trunk Assessment Cervical / Trunk Assessment: Normal  Communication   Communication: No difficulties  Cognition Arousal/Alertness: Awake/alert Behavior During Therapy: WFL for tasks assessed/performed Overall Cognitive Status: Within Functional Limits for tasks assessed  General Comments      Exercises     Assessment/Plan    PT Assessment Patient needs continued PT services  PT Problem List Decreased strength;Decreased activity tolerance;Decreased mobility;Cardiopulmonary status limiting activity       PT Treatment Interventions DME instruction;Therapeutic exercise;Gait training;Stair training;Functional mobility training;Patient/family education;Therapeutic activities    PT Goals (Current goals can be found in the Care Plan section)  Acute Rehab PT Goals Patient Stated Goal: to be independent, feel better PT Goal  Formulation: With patient Time For Goal Achievement: 04/13/18 Potential to Achieve Goals: Good    Frequency Min 3X/week   Barriers to discharge        Co-evaluation PT/OT/SLP Co-Evaluation/Treatment: Yes Reason for Co-Treatment: For patient/therapist safety PT goals addressed during session: Mobility/safety with mobility OT goals addressed during session: ADL's and self-care       AM-PAC PT "6 Clicks" Daily Activity  Outcome Measure Difficulty turning over in bed (including adjusting bedclothes, sheets and blankets)?: A Little Difficulty moving from lying on back to sitting on the side of the bed? : A Little Difficulty sitting down on and standing up from a chair with arms (e.g., wheelchair, bedside commode, etc,.)?: A Lot Help needed moving to and from a bed to chair (including a wheelchair)?: A Lot Help needed walking in hospital room?: A Lot Help needed climbing 3-5 steps with a railing? : Total 6 Click Score: 13    End of Session Equipment Utilized During Treatment: Gait belt Activity Tolerance: Patient tolerated treatment well Patient left: in chair;with call bell/phone within reach Nurse Communication: Mobility status PT Visit Diagnosis: Unsteadiness on feet (R26.81)    Time: 8366-2947 PT Time Calculation (min) (ACUTE ONLY): 22 min   Charges:   PT Evaluation $PT Eval Low Complexity: 1 Low     PT G CodesTresa Richard PT [phone]   Lauren Richard 03/30/2018, 12:44 PM

## 2018-03-30 NOTE — Telephone Encounter (Signed)
Patient's niece stopped by to let us know patient in hospital.  Cancelled 6/20 appts.  Deatra Canter spoke w/ Dr. Ernestina Penna nurse.  Gave patient phone # to call pre-op regarding appt scheduled for 6/21.

## 2018-03-30 NOTE — Progress Notes (Signed)
PROGRESS NOTE    Lauren Richard  AJO:[id number] DOB: 1936/08/31 DOA: 03/29/2018 PCP: Glendale Chard, MD   Brief Narrative:  Lauren Richard is a 82 y.o. female with medical history significant of metastatic pancreatic cancer, HTN, GERD, recent dx PE on xarelto who presents with generalized weakness.  She states that her chemotherapy regimen was increased about a week ago.  Since then, she has noticed generalized weakness, nausea, vomiting 2-3 episodes this morning, diarrhea since Sunday 2-3 episodes.  States that she feels weak overall, even taking a shower this morning took much effort.  She has no other complaints of fevers, new or worsening cough, shortness of breath, chest pain, abdominal pain, dysuria. Admitted for Generalized Weakness, Nausea, Vomiting, and Diarrhea and is slowly improving.   Assessment & Plan:   Principal Problem:   Generalized weakness Active Problems:   Hyperlipidemia   Pancreatic mass   Hypokalemia   Adenocarcinoma of head of pancreas (HCC)   Chemotherapy induced neutropenia (HCC)   Nausea & vomiting   Diarrhea  Generalized Weakness -Supportive care as below -PT/ OT evaluated and Treated and recommending SNF vs. HHPT if improves -Social Work Consulted in anticipation for SNF discharge  Nausea, Vomiting, Diarrhea, improving -Likely secondary to chemotherapy -C/w Supportive Care and Antiemetics prn with Zofran 4 mg po/IV q6hprn -IVF with NS at 100 mL/hr -Diet Advanced to Regular Diet   Lactic Acidosis -Likely from Above -Give NS 1 Liter bolus today and will continue IVF Maintenance at 100 mL/hr -LA went from 2.84 and then trended down to 1.6 and is now back up to 3.3  Hypokalemia -Patient's K+ was 2.9 this AM -Replete with potassium chloride 40 mg p.o. twice daily x2 doses along with IV KCL 40 mEQ -She is to monitor and replete as necessary -Repeat CMP in a.m.  Hypomagnesemia -Improved.  Since magnesium level went from 1.4 is now  1.9 -Continue to monitor and replete as necessary -Repeat Magnesium level in the a.m.  Metastatic Pancreatic Cancer (Adenocarcinoma of the Head of the Pancreas)  -Follows with Dr. Burr Medico -CA 19-9 was 126 earlier this AM -Will notify Dr. Burr Medico of Patient's Admission  Neutropenia without fever -Blood cultures obtained in ED and are pending  -Urinalysis showed hazy appearance with amber color, small hemoglobin, small leukocytes, rare bacteria, 11-20 squamous epithelial cells and 6-10 urine WBCs -Urine cultures pending however patient is not complaining of any urinary complaints or symptoms -Continue to Monitor Daily CBC; WBC increased from 0.9 -> 1.7 -No antibiotics for now   Recently  PE -On Rivaroxaban 15 mg po BID x 4 more days to complete initial 21 days and then will be placed on Xarelto 20 mg po Daily with supper   Transaminitis/Abnormal LFT's -Possibly related to chemotherapy, as noted by Dr. Ernestina Penna note -Stable from previous labs and slightly Trending down -AST is now down from 217 and is 146 -ALT is now down from 110 and is now 70 -Continue to Monitor and Trend LFT's; Repeat CMP in AM   HTN -C/w Amlodipine 5 mg po Daily and with Atenolol 25 mg po Daily   GERD -C/w Pantoprazole 40 mg po Daily   DVT prophylaxis: Anticoagulated with Rivaroxaban 15 mg po BID Code Status: FULL CODE Family Communication: No family present at bedside  Disposition Plan: Re  Consultants:   Notified Medical Oncology Dr. Burr Medico   Procedures: None  Antimicrobials: Anti-infectives (From admission, onward)   None     Subjective: Seen and examined at bedside and  still having some diarrhea but nausea and vomiting improved.  No chest pain, shortness breath, headedness dizziness but does feel weak overall.  No other complaints or concerns at this time.  Objective: Vitals:   03/29/18 2117 03/30/18 0506 03/30/18 1045 03/30/18 1341  BP: 115/60 111/72 129/73   Pulse: 99 96 98 87  Resp:    16   Temp:  99.5 F (37.5 C)  98.8 F (37.1 C)  TempSrc:  Oral  Oral  SpO2: 99% 98%  99%  Weight:        Intake/Output Summary (Last 24 hours) at 03/30/2018 1357 Last data filed at 03/30/2018 0600 Gross per 24 hour  Intake 2493.33 ml  Output -  Net 2493.33 ml   Filed Weights   03/29/18 1101  Weight: 63.5 kg (140 lb)   Examination: Physical Exam:  Constitutional: Thin AAF in NAD and appears calm and comfortable and appears younger than stated age.  Eyes: Lids and conjunctivae normal, sclerae anicteric  ENMT: External Ears, Nose appear normal. Grossly normal hearing.  Neck: Appears normal, supple, no cervical masses, normal ROM, no appreciable thyromegaly, no JVD Respiratory: Diminished to auscultation bilaterally, no wheezing, rales, rhonchi or crackles. Normal respiratory effort and patient is not tachypenic. No accessory muscle use.  Cardiovascular: RRR, no murmurs / rubs / gallops. S1 and S2 auscultated. No extremity edema. Abdomen: Soft, Slightly tender, non-distended.  Bowel sounds positive x4.  GU: Deferred. Musculoskeletal: No clubbing / cyanosis of digits/nails. No joint deformity upper and lower extremities. Skin: No rashes, lesions, ulcers on a limited skin evaluation. No induration; Warm and dry.  Neurologic: CN 2-12 grossly intact with no focal deficits. Romberg sign and cerebellar reflexes not assessed.  Psychiatric: Normal judgment and insight. Alert and oriented x 3. Normal mood and appropriate affect.   Data Reviewed: I have personally reviewed following labs and imaging studies  CBC: Recent Labs  Lab 03/29/18 1200 03/30/18 0539  WBC 0.9* 1.7*  NEUTROABS 0.4  --   HGB 8.8* 8.1*  HCT 27.6* 24.7*  MCV 89.9 90.8  PLT 172 650   Basic Metabolic Panel: Recent Labs  Lab 03/29/18 1200 03/30/18 0539  NA 138 141  K 2.7* 2.9*  CL 104 109  CO2 26 25  GLUCOSE 141* 106*  BUN 8 6  CREATININE 1.06* 0.79  CALCIUM 8.9 8.6*  MG 1.4* 1.9   GFR: Estimated  Creatinine Clearance: 54.5 mL/min (by C-G formula based on SCr of 0.79 mg/dL). Liver Function Tests: Recent Labs  Lab 03/29/18 1200 03/30/18 0539  AST 172* 146*  ALT 82* 70*  ALKPHOS 92 78  BILITOT 1.1 1.0  PROT 7.3 6.4*  ALBUMIN 2.9* 2.5*   No results for input(s): LIPASE, AMYLASE in the last 168 hours. No results for input(s): AMMONIA in the last 168 hours. Coagulation Profile: No results for input(s): INR, PROTIME in the last 168 hours. Cardiac Enzymes: No results for input(s): CKTOTAL, CKMB, CKMBINDEX, TROPONINI in the last 168 hours. BNP (last 3 results) No results for input(s): PROBNP in the last 8760 hours. HbA1C: No results for input(s): HGBA1C in the last 72 hours. CBG: No results for input(s): GLUCAP in the last 168 hours. Lipid Profile: No results for input(s): CHOL, HDL, LDLCALC, TRIG, CHOLHDL, LDLDIRECT in the last 72 hours. Thyroid Function Tests: No results for input(s): TSH, T4TOTAL, FREET4, T3FREE, THYROIDAB in the last 72 hours. Anemia Panel: No results for input(s): VITAMINB12, FOLATE, FERRITIN, TIBC, IRON, RETICCTPCT in the last 72 hours. Sepsis Labs: Recent  Labs  Lab 03/29/18 1319 03/30/18 0847 03/30/18 1123  LATICACIDVEN 2.84* 1.6 3.3*    No results found for this or any previous visit (from the past 240 hour(s)).   Radiology Studies: No results found.  Scheduled Meds: . amLODipine  5 mg Oral Daily  . aspirin  81 mg Oral Daily  . atenolol  25 mg Oral Daily  . gabapentin  200 mg Oral BID  . mirtazapine  7.5 mg Oral QHS  . pantoprazole  40 mg Oral Q1200  . potassium chloride  40 mEq Oral BID  . rivaroxaban  15 mg Oral BID WC   Followed by  . [START ON 04/01/2018] rivaroxaban  20 mg Oral Q supper   Continuous Infusions: . sodium chloride 100 mL/hr at 03/29/18 1504  . potassium chloride 10 mEq (03/30/18 1240)    LOS: 1 day   Kerney Elbe, DO [facility] Pager [phone]  If 7PM-7AM, please contact  night-coverage [email or web address] Password [email or web address] 03/30/2018, 1:57 PM

## 2018-03-30 NOTE — NC FL2 (Signed)
[place] LEVEL OF CARE SCREENING TOOL     IDENTIFICATION  Patient Name: Lauren Richard Birthdate: 02-Aug-1936 Sex: female Admission Date (Current Location): 03/29/2018  [facility] and [place] Number:  Herbalist and Address:  [facility],  [place], [place]      Provider Number: [id number]  Attending Physician Name and Address:  Kerney Elbe, DO  Relative Name and Phone Number:       Current Level of Care: Hospital Recommended Level of Care: [place] Prior Approval Number:    Date Approved/Denied:   PASRR Number: [id number]  Discharge Plan: SNF    Current Diagnoses: Patient Active Problem List   Diagnosis Date Noted  . Generalized weakness 03/29/2018  . Nausea & vomiting 03/29/2018  . Diarrhea 03/29/2018  . Chemotherapy induced neutropenia ([place]) 03/23/2018  . Family history of pancreatic cancer   . Family history of breast cancer   . Family history of kidney cancer   . Family history of prostate cancer   . Goals of care, counseling/discussion 03/09/2018  . Acute pulmonary embolism ([place]) 03/09/2018  . S/P laparoscopy 01/05/2018  . Adenocarcinoma of head of pancreas ([place]) 11/24/2017  . Acute kidney injury superimposed on chronic kidney disease ([place])   . Hypokalemia   . Anemia   . Biliary obstruction   . Acute kidney injury ([place]) 11/03/2017  . Pancreatic mass 11/03/2017  . Osteoarthritis of left knee 04/26/2017  . S/P total knee replacement using cement, left 04/26/2017  . Essential hypertension 03/03/2017  . Hyperlipidemia 03/03/2017  . Osteoarthritis 03/03/2017  . Obesity 03/03/2017  . Status post left foot surgery 12/03/2015  . Hammertoe 11/03/2015    Orientation RESPIRATION BLADDER Height & Weight     Self, Time, Place, Situation  Normal Continent Weight: 140 lb (63.5 kg) Height:     BEHAVIORAL SYMPTOMS/MOOD NEUROLOGICAL BOWEL NUTRITION STATUS      Incontinent Diet(see dc  summary)  AMBULATORY STATUS COMMUNICATION OF NEEDS Skin   Limited Assist Verbally Normal                       Personal Care Assistance Level of Assistance  Bathing, Feeding, Dressing Bathing Assistance: Limited assistance Feeding assistance: Independent Dressing Assistance: Limited assistance     Functional Limitations Info  Sight, Hearing, Speech Sight Info: Adequate Hearing Info: Adequate Speech Info: Adequate    SPECIAL CARE FACTORS FREQUENCY  PT (By licensed PT), OT (By licensed OT)     PT Frequency: Min 3x/week OT Frequency: Min 2x/week            Contractures Contractures Info: Not present    Additional Factors Info  Code Status, Allergies Code Status Info: Full Code Allergies Info: NKA           Current Medications (03/30/2018):  This is the current hospital active medication list Current Facility-Administered Medications  Medication Dose Route Frequency Provider Last Rate Last Dose  . 0.9 %  sodium chloride infusion   Intravenous Continuous Dessa Phi, DO 100 mL/hr at 03/29/18 1504    . acetaminophen (TYLENOL) tablet 650 mg  650 mg Oral Q6H PRN Dessa Phi, DO       Or  . acetaminophen (TYLENOL) suppository 650 mg  650 mg Rectal Q6H PRN Dessa Phi, DO      . amLODipine (NORVASC) tablet 5 mg  5 mg Oral Daily Dessa Phi, DO   5 mg at 03/30/18 1038  . aspirin  chewable tablet 81 mg  81 mg Oral Daily Dessa Phi, DO   81 mg at 03/30/18 1038  . atenolol (TENORMIN) tablet 25 mg  25 mg Oral Daily Dessa Phi, DO   25 mg at 03/30/18 1038  . gabapentin (NEURONTIN) capsule 200 mg  200 mg Oral BID Dessa Phi, DO   200 mg at 03/30/18 1038  . mirtazapine (REMERON) tablet 7.5 mg  7.5 mg Oral QHS Dessa Phi, DO   7.5 mg at 03/29/18 2124  . nystatin (MYCOSTATIN) 100000 UNIT/ML suspension 500,000 Units  5 mL Oral QID PRN Dessa Phi, DO      . ondansetron [facility]) tablet 4 mg  4 mg Oral Q6H PRN Dessa Phi, DO       Or  .  ondansetron [facility]) injection 4 mg  4 mg Intravenous Q6H PRN Dessa Phi, DO   4 mg at 03/29/18 1830  . pantoprazole (PROTONIX) EC tablet 40 mg  40 mg Oral Q1200 Dessa Phi, DO   40 mg at 03/30/18 1240  . potassium chloride 20 MEQ/15ML (10%) solution 40 mEq  40 mEq Oral BID Raiford Noble [place] Hurley, DO   40 mEq at 03/30/18 1038  . Rivaroxaban (XARELTO) tablet 15 mg  15 mg Oral BID WC Shade, Christine E, RPH   15 mg at 03/30/18 1038   Followed by  . [START ON 04/01/2018] rivaroxaban (XARELTO) tablet 20 mg  20 mg Oral Q supper Shade, Haze Justin, RPH      . traMADol (ULTRAM) tablet 50 mg  50 mg Oral Q12H PRN Dessa Phi, DO         Discharge Medications: Please see discharge summary for a list of discharge medications.  Relevant Imaging Results:  Relevant Lab Results:   Additional Information SS# Burke, LCSW

## 2018-03-31 ENCOUNTER — Inpatient Hospital Stay (HOSPITAL_COMMUNITY)
Admission: RE | Admit: 2018-03-31 | Discharge: 2018-03-31 | Disposition: A | Discharge status: Home / Self Care | Payer: PPO | Source: Ambulatory Visit

## 2018-03-31 DIAGNOSIS — D709 Neutropenia, unspecified: Secondary | ICD-10-CM

## 2018-03-31 DIAGNOSIS — K521 Toxic gastroenteritis and colitis: Secondary | ICD-10-CM

## 2018-03-31 DIAGNOSIS — R5081 Fever presenting with conditions classified elsewhere: Secondary | ICD-10-CM

## 2018-03-31 LAB — GASTROINTESTINAL PANEL BY PCR, STOOL (REPLACES STOOL CULTURE)
Adenovirus F40/41: NOT DETECTED
Astrovirus: NOT DETECTED
CYCLOSPORA CAYETANENSIS: NOT DETECTED
Campylobacter species: NOT DETECTED
Cryptosporidium: NOT DETECTED
Entamoeba histolytica: NOT DETECTED
Enteroaggregative E coli (EAEC): NOT DETECTED
Enteropathogenic E coli (EPEC): NOT DETECTED
Enterotoxigenic E coli (ETEC): NOT DETECTED
Giardia lamblia: NOT DETECTED
Norovirus GI/GII: NOT DETECTED
Plesimonas shigelloides: NOT DETECTED
Rotavirus A: NOT DETECTED
SALMONELLA SPECIES: NOT DETECTED
SAPOVIRUS (I, II, IV, AND V): NOT DETECTED
SHIGA LIKE TOXIN PRODUCING E COLI (STEC): NOT DETECTED
SHIGELLA/ENTEROINVASIVE E COLI (EIEC): NOT DETECTED
VIBRIO SPECIES: NOT DETECTED
Vibrio cholerae: NOT DETECTED
Yersinia enterocolitica: NOT DETECTED

## 2018-03-31 LAB — URINALYSIS, ROUTINE W REFLEX MICROSCOPIC
BILIRUBIN URINE: NEGATIVE
GLUCOSE, UA: NEGATIVE mg/dL
Hgb urine dipstick: NEGATIVE
Ketones, ur: NEGATIVE mg/dL
Leukocytes, UA: NEGATIVE
NITRITE: NEGATIVE
PH: 6 (ref 5.0–8.0)
Protein, ur: NEGATIVE mg/dL
SPECIFIC GRAVITY, URINE: 1.017 (ref 1.005–1.030)

## 2018-03-31 LAB — COMPREHENSIVE METABOLIC PANEL
ALBUMIN: 2.4 g/dL — AB (ref 3.5–5.0)
ALK PHOS: 70 U/L (ref 38–126)
ALT: 58 U/L — AB (ref 14–54)
AST: 117 U/L — AB (ref 15–41)
Anion gap: 5 (ref 5–15)
BILIRUBIN TOTAL: 0.9 mg/dL (ref 0.3–1.2)
CO2: 21 mmol/L — ABNORMAL LOW (ref 22–32)
CREATININE: 0.76 mg/dL (ref 0.44–1.00)
Calcium: 8.6 mg/dL — ABNORMAL LOW (ref 8.9–10.3)
Chloride: 114 mmol/L — ABNORMAL HIGH (ref 101–111)
GFR calc Af Amer: >60 mL/min (ref >60)
GFR calc non Af Amer: >60 mL/min (ref >60)
GLUCOSE: 100 mg/dL — AB (ref 65–99)
POTASSIUM: 4.3 mmol/L (ref 3.5–5.1)
Sodium: 140 mmol/L (ref 135–145)
TOTAL PROTEIN: 6.1 g/dL — AB (ref 6.5–8.1)

## 2018-03-31 LAB — CBC WITH DIFFERENTIAL/PLATELET
BASOS ABS: 0 10*3/uL (ref 0.0–0.1)
Basophils Relative: 0 %
EOS PCT: 3 %
Eosinophils Absolute: 0.1 10*3/uL (ref 0.0–0.7)
HEMATOCRIT: 24.5 % — AB (ref 36.0–46.0)
HEMOGLOBIN: 8 g/dL — AB (ref 12.0–15.0)
LYMPHS PCT: 49 %
Lymphs Abs: 1.4 10*3/uL (ref 0.7–4.0)
MCH: 29.9 pg (ref 26.0–34.0)
MCHC: 32.7 g/dL (ref 30.0–36.0)
MCV: 91.4 fL (ref 78.0–100.0)
Monocytes Absolute: 0.5 10*3/uL (ref 0.1–1.0)
Monocytes Relative: 19 %
NEUTROS PCT: 29 %
Neutro Abs: 0.8 10*3/uL — ABNORMAL LOW (ref 1.7–7.7)
Platelets: 164 10*3/uL (ref 150–400)
RBC: 2.68 MIL/uL — AB (ref 3.87–5.11)
RDW: 18.4 % — AB (ref 11.5–15.5)
WBC: 2.8 10*3/uL — AB (ref 4.0–10.5)

## 2018-03-31 LAB — PHOSPHORUS: Phosphorus: 1.3 mg/dL — ABNORMAL LOW (ref 2.5–4.6)

## 2018-03-31 LAB — PREPARE RBC (CROSSMATCH)

## 2018-03-31 LAB — MAGNESIUM: Magnesium: 1.5 mg/dL — ABNORMAL LOW (ref 1.7–2.4)

## 2018-03-31 MED ORDER — TBO-FILGRASTIM 300 MCG/0.5ML ~~LOC~~ SOSY
300.0000 ug | PREFILLED_SYRINGE | Freq: Every day | SUBCUTANEOUS | Status: DC
  Filled 2018-03-31: qty 0.5

## 2018-03-31 MED ORDER — PIPERACILLIN-TAZOBACTAM 3.375 G IVPB
3.3750 g | Freq: Three times a day (TID) | INTRAVENOUS | Status: DC
  Administered 2018-03-31 – 2018-04-03 (×7): 3.375 g via INTRAVENOUS
  Filled 2018-03-31 (×11): qty 50

## 2018-03-31 MED ORDER — MAGNESIUM SULFATE 2 GM/50ML IV SOLN
2.0000 g | Freq: Once | INTRAVENOUS | Status: AC
  Administered 2018-03-31: 2 g via INTRAVENOUS
  Filled 2018-03-31: qty 50

## 2018-03-31 MED ORDER — SODIUM CHLORIDE 0.9% IV SOLUTION
Freq: Once | INTRAVENOUS | Status: AC
  Administered 2018-03-31: 14:00:00 via INTRAVENOUS

## 2018-03-31 MED ORDER — POTASSIUM PHOSPHATES 15 MMOLE/5ML IV SOLN
30.0000 mmol | Freq: Once | INTRAVENOUS | Status: AC
  Administered 2018-03-31: 30 mmol via INTRAVENOUS
  Filled 2018-03-31: qty 10

## 2018-03-31 MED ORDER — TBO-FILGRASTIM 480 MCG/0.8ML ~~LOC~~ SOSY
480.0000 ug | PREFILLED_SYRINGE | Freq: Once | SUBCUTANEOUS | Status: AC
  Administered 2018-03-31: 480 ug via SUBCUTANEOUS
  Filled 2018-03-31: qty 0.8

## 2018-03-31 NOTE — Progress Notes (Signed)
Occupational Therapy Treatment Patient Details Name: Lauren Richard MRN: [id number] DOB: 27-Jan-1936 Today's Date: 03/31/2018    History of present illness Lauren Richard is a 82 y.o. female with medical history significant of metastatic pancreatic cancer, HTN, GERD, recent dx PE  who presents with generalized weakness, diarrhea, weakness.   OT comments  Pt was receiving blood at time of OT tx.  Assisted with ADL from EOB.  Pt returned to supine at end of session due to fatique.  Pt will benefit from continued OT at SNF. Will continue to follow in acute setting  Follow Up Recommendations  SNF    Equipment Recommendations  3 in 1 bedside commode    Recommendations for Other Services      Precautions / Restrictions Precautions Precautions: Fall Precaution Comments: urgency for diarrhea, monitor HR Restrictions Weight Bearing Restrictions: No       Mobility Bed Mobility Overal bed mobility: Needs Assistance Bed Mobility: Supine to Sit     Supine to sit: Min assist     General bed mobility comments: min guard for back to bed:  watched lines  Transfers Overall transfer level: Needs assistance Equipment used: Rolling walker (2 wheeled) Transfers: Sit to/from [facility] Sit to Stand: Min guard Stand pivot transfers: Min assist       General transfer comment: for safety; rocked to stand up    Balance   Sitting-balance support: No upper extremity supported;Feet unsupported Sitting balance-Leahy Scale: Good     Standing balance support: Bilateral upper extremity supported;During functional activity Standing balance-Leahy Scale: Fair                             ADL either performed or assessed with clinical judgement   ADL               Lower Body Bathing: Minimal assistance;Sit to/from stand   Upper Body Dressing : Minimal assistance;Sitting(lines)                     General ADL Comments: Pt was receiving blood;  wanted to wash up.  Assisted with bathing and new gown.  Pt did not need to use bathroom and was fatiqued after performing ADL. Returned to supine at end of session     Vision       Perception     Praxis      Cognition Arousal/Alertness: Awake/alert Behavior During Therapy: WFL for tasks assessed/performed Overall Cognitive Status: Within Functional Limits for tasks assessed                                          Exercises     Shoulder Instructions       General Comments      Pertinent Vitals/ Pain       Pain Assessment: No/denies pain  Home Living                                          Prior Functioning/Environment              Frequency  Min 2X/week        Progress Toward Goals  OT Goals(current goals can now be found in the care plan section)  Progress towards OT goals: Progressing toward goals     Plan      Co-evaluation                 AM-PAC PT "6 Clicks" Daily Activity     Outcome Measure   Help from another person eating meals?: None Help from another person taking care of personal grooming?: A Little Help from another person toileting, which includes using toliet, bedpan, or urinal?: A Lot Help from another person bathing (including washing, rinsing, drying)?: A Little Help from another person to put on and taking off regular upper body clothing?: A Little Help from another person to put on and taking off regular lower body clothing?: A Lot 6 Click Score: 17    End of Session    OT Visit Diagnosis: Muscle weakness (generalized) (M62.81)   Activity Tolerance Patient tolerated treatment well   Patient Left in bed;with call bell/phone within reach(pt requested 4 rails up)   Nurse Communication          Time: 4008-6761 OT Time Calculation (min): 22 min  Charges: OT General Charges $OT Visit: 1 Visit OT Treatments $Self Care/Home Management : 8-22 mins  Lesle Chris,  OTR/L [phone] 03/31/2018   Baer Hinton 03/31/2018, 2:31 PM

## 2018-03-31 NOTE — Progress Notes (Signed)
Physical Therapy Treatment Patient Details Name: Lauren Richard MRN: [id number] DOB: December 06, 1935 Today's Date: 03/31/2018    History of Present Illness Lauren Richard is a 82 y.o. female with medical history significant of metastatic pancreatic cancer, HTN, GERD, recent dx PE  who presents with generalized weakness, diarrhea, weakness.    PT Comments    Patient will not have her usual family assistance as niece will be out of town for a week. Patient progressing well with Physical Therapy in the acute setting. Patient able to ambulate 300 feet with RW and go up and down 4 stairs x2, but with safety and fall risk concerns. Patient has one full flight to climb to get into her apartment. Pt admitted with above diagnosis. Pt currently with functional limitations due to the deficits listed below (see PT Problem List). Pt will benefit from skilled PT to increase their independence and safety with mobility to allow discharge to the venue listed below.     Follow Up Recommendations  SNF;Other (comment)(patient's niece helps take care of her and neice will be out of town for a week.)     Equipment Recommendations  3in1 (PT)    Recommendations for Other Services       Precautions / Restrictions Precautions Precautions: Other (comment) Precaution Comments: urgency for diarrhea, monitor HR Restrictions Weight Bearing Restrictions: No    Mobility  Bed Mobility Overal bed mobility: Needs Assistance Bed Mobility: Supine to Sit     Supine to sit: Min assist     General bed mobility comments: for bedding  Transfers Overall transfer level: Needs assistance Equipment used: Rolling walker (2 wheeled) Transfers: Sit to/from [facility] Sit to Stand: Min guard Stand pivot transfers: Min assist       General transfer comment: 2 attempts to rise/power up from EOB and recliner. Requires cues for sequencing of steps and placement of hands during stand pivot transfers with RW  for safety.  Ambulation/Gait Ambulation/Gait assistance: Min guard Gait Distance (Feet): 300 Feet Assistive device: Rolling walker (2 wheeled) Gait Pattern/deviations: Step-through pattern;Trunk flexed;Decreased stride length Gait velocity: decr   General Gait Details: verbal cues to stand more upright, walking in walker closer to BOS/COG   Stairs Stairs: Yes Stairs assistance: Min assist Stair Management: Two rails;Step to pattern Number of Stairs: 8 General stair comments: assist for line management; did not attempt with RW; step up with right; down with left; painful left knee   Wheelchair Mobility    Modified Rankin (Stroke Patients Only)       Balance   Sitting-balance support: No upper extremity supported;Feet unsupported Sitting balance-Leahy Scale: Good     Standing balance support: Bilateral upper extremity supported;During functional activity Standing balance-Leahy Scale: Fair                              Cognition Arousal/Alertness: Awake/alert Behavior During Therapy: WFL for tasks assessed/performed Overall Cognitive Status: Within Functional Limits for tasks assessed                                        Exercises      General Comments        Pertinent Vitals/Pain Pain Assessment: No/denies pain    Home Living  Prior Function            PT Goals (current goals can now be found in the care plan section) Progress towards PT goals: Progressing toward goals    Frequency    Min 3X/week      PT Plan Current plan remains appropriate    Co-evaluation              AM-PAC PT "6 Clicks" Daily Activity  Outcome Measure  Difficulty turning over in bed (including adjusting bedclothes, sheets and blankets)?: A Little Difficulty moving from lying on back to sitting on the side of the bed? : A Little Difficulty sitting down on and standing up from a chair with arms (e.g.,  wheelchair, bedside commode, etc,.)?: A Little Help needed moving to and from a bed to chair (including a wheelchair)?: A Little Help needed walking in hospital room?: A Little Help needed climbing 3-5 steps with a railing? : A Lot 6 Click Score: 17    End of Session Equipment Utilized During Treatment: Gait belt Activity Tolerance: Patient tolerated treatment well Patient left: with call bell/phone within reach;in bed;with nursing/sitter in room Nurse Communication: Mobility status PT Visit Diagnosis: Unsteadiness on feet (R26.81)     Time: 6761-9509 PT Time Calculation (min) (ACUTE ONLY): 28 min  Charges:  $Gait Training: 23-37 mins                    G Codes:       Wadie Liew D. Hartnett-Rands, MS, PT Per [place] #[phone] 03/31/2018, 12:20 PM

## 2018-03-31 NOTE — Progress Notes (Signed)
Lauren Richard   DOB:[birth year]   KY#:[id number]   BTD#:[id number]  Oncology f/u note   Subjective: Pt had fever T100.7 in the early morning, ANC 0.8K today. She denies any chill, diarrhea has slightly improved, her appetite is much better, she walked in the hallway today, denies dizziness or other new symptoms.   Objective:  Vitals:   03/31/18 1243 03/31/18 1430  BP: (!) 117/59 128/69  Pulse: 87 80  Resp: 18 16  Temp: 99 F (37.2 C) 99.4 F (37.4 C)  SpO2: 100% 100%    Body mass index is 21.93 kg/m.  Intake/Output Summary (Last 24 hours) at 03/31/2018 1937 Last data filed at 03/31/2018 1500 Gross per 24 hour  Intake 1687.42 ml  Output -  Net 1687.42 ml     Sclerae unicteric  Oropharynx clear  No peripheral adenopathy  Lungs clear -- no rales or rhonchi  Heart regular rate and rhythm  Abdomen benign  MSK no focal spinal tenderness, no peripheral edema  Neuro nonfocal   CBG (last 3)  No results for input(s): GLUCAP in the last 72 hours.   Labs:  Lab Results  Component Value Date   WBC 2.8 (L) 03/31/2018   HGB 8.0 (L) 03/31/2018   HCT 24.5 (L) 03/31/2018   MCV 91.4 03/31/2018   PLT 164 03/31/2018   NEUTROABS 0.8 (L) 03/31/2018    CMP Latest Ref Rng & Units 03/31/2018 03/30/2018 03/29/2018  Glucose 65 - 99 mg/dL 100(H) 106(H) 141(H)  BUN 6 - 20 mg/dL <5(L) 6 8  Creatinine 0.44 - 1.00 mg/dL 0.76 0.79 1.06(H)  Sodium 135 - 145 mmol/L 140 141 138  Potassium 3.5 - 5.1 mmol/L 4.3 2.9(L) 2.7(LL)  Chloride 101 - 111 mmol/L 114(H) 109 104  CO2 22 - 32 mmol/L 21(L) 25 26  Calcium 8.9 - 10.3 mg/dL 8.6(L) 8.6(L) 8.9  Total Protein 6.5 - 8.1 g/dL 6.1(L) 6.4(L) 7.3  Total Bilirubin 0.3 - 1.2 mg/dL 0.9 1.0 1.1  Alkaline Phos 38 - 126 U/L 70 78 92  AST 15 - 41 U/L 117(H) 146(H) 172(H)  ALT 14 - 54 U/L 58(H) 70(H) 82(H)     Urine Studies No results for input(s): UHGB, CRYS in the last 72 hours.  Invalid input(s): UACOL, UAPR, USPG, UPH, UTP, UGL, UKET, UBIL, UNIT, UROB,  ULEU, UEPI, UWBC, URBC, UBAC, CAST, UCOM, [place]  Basic Metabolic Panel: Recent Labs  Lab 03/29/18 1200 03/30/18 0539 03/31/18 0552  NA 138 141 140  K 2.7* 2.9* 4.3  CL 104 109 114*  CO2 26 25 21*  GLUCOSE 141* 106* 100*  BUN 8 6 <5*  CREATININE 1.06* 0.79 0.76  CALCIUM 8.9 8.6* 8.6*  MG 1.4* 1.9 1.5*  PHOS  --   --  1.3*   GFR Estimated Creatinine Clearance: 54.5 mL/min (by C-G formula based on SCr of 0.76 mg/dL). Liver Function Tests: Recent Labs  Lab 03/29/18 1200 03/30/18 0539 03/31/18 0552  AST 172* 146* 117*  ALT 82* 70* 58*  ALKPHOS 92 78 70  BILITOT 1.1 1.0 0.9  PROT 7.3 6.4* 6.1*  ALBUMIN 2.9* 2.5* 2.4*   No results for input(s): LIPASE, AMYLASE in the last 168 hours. No results for input(s): AMMONIA in the last 168 hours. Coagulation profile No results for input(s): INR, PROTIME in the last 168 hours.  CBC: Recent Labs  Lab 03/29/18 1200 03/30/18 0539 03/31/18 0552  WBC 0.9* 1.7* 2.8*  NEUTROABS 0.4  --  0.8*  HGB 8.8* 8.1* 8.0*  HCT  27.6* 24.7* 24.5*  MCV 89.9 90.8 91.4  PLT 172 154 164   Cardiac Enzymes: No results for input(s): CKTOTAL, CKMB, CKMBINDEX, TROPONINI in the last 168 hours. BNP: Invalid input(s): POCBNP CBG: No results for input(s): GLUCAP in the last 168 hours. D-Dimer No results for input(s): DDIMER in the last 72 hours. Hgb A1c No results for input(s): HGBA1C in the last 72 hours. Lipid Profile No results for input(s): CHOL, HDL, LDLCALC, TRIG, CHOLHDL, LDLDIRECT in the last 72 hours. Thyroid function studies No results for input(s): TSH, T4TOTAL, T3FREE, THYROIDAB in the last 72 hours.  Invalid input(s): FREET3 Anemia work up No results for input(s): VITAMINB12, FOLATE, FERRITIN, TIBC, IRON, RETICCTPCT in the last 72 hours. Microbiology Recent Results (from the past 240 hour(s))  Blood Culture (routine x 2)     Status: None (Preliminary result)   Collection Time: 03/29/18 12:38 PM  Result Value Ref Range Status    Specimen Description   Final    BLOOD LEFT HAND Performed at [facility], [place] [address], [place], [place] [address]    Special Requests   Final    BOTTLES DRAWN AEROBIC AND ANAEROBIC Blood Culture results may not be optimal due to an inadequate volume of blood received in culture bottles Performed at [facility] [address], [place], [place] [address]    Culture   Final    NO GROWTH 2 DAYS Performed at [facility] [address] [place], [place] [address]    Report Status PENDING  Incomplete  Blood Culture (routine x 2)     Status: None (Preliminary result)   Collection Time: 03/29/18  3:40 PM  Result Value Ref Range Status   Specimen Description   Final    BLOOD RIGHT HAND Performed at [facility] [address], [place], [place] [address]    Special Requests   Final    BOTTLES DRAWN AEROBIC ONLY Blood Culture results may not be optimal due to an inadequate volume of blood received in culture bottles Performed at [facility] [address], [place], [place] [address]    Culture   Final    NO GROWTH 2 DAYS Performed at [facility] [address] [place], [place] [address]    Report Status PENDING  Incomplete  Urine culture     Status: Abnormal   Collection Time: 03/29/18  5:38 PM  Result Value Ref Range Status   Specimen Description   Final    URINE, RANDOM Performed at [facility] [address], [place], [place] [address]    Special Requests   Final    NONE Performed at [facility], [place] [address], [place], [place] [address]    Culture MULTIPLE SPECIES PRESENT, SUGGEST RECOLLECTION (A)  Final   Report Status 03/30/2018 FINAL  Final  Gastrointestinal Panel by PCR , Stool     Status: None   Collection Time: 03/31/18  8:25 AM  Result Value Ref Range Status   Campylobacter species NOT DETECTED NOT DETECTED Final   Plesimonas  shigelloides NOT DETECTED NOT DETECTED Final   Salmonella species NOT DETECTED NOT DETECTED Final   Yersinia enterocolitica NOT DETECTED NOT DETECTED Final   Vibrio species NOT DETECTED NOT DETECTED Final   Vibrio cholerae NOT DETECTED NOT DETECTED Final   Enteroaggregative E coli (EAEC) NOT DETECTED NOT DETECTED Final   Enteropathogenic E coli (EPEC) NOT DETECTED NOT DETECTED Final  Enterotoxigenic E coli (ETEC) NOT DETECTED NOT DETECTED Final   Shiga like toxin producing E coli (STEC) NOT DETECTED NOT DETECTED Final   Shigella/Enteroinvasive E coli (EIEC) NOT DETECTED NOT DETECTED Final   Cryptosporidium NOT DETECTED NOT DETECTED Final   Cyclospora cayetanensis NOT DETECTED NOT DETECTED Final   Entamoeba histolytica NOT DETECTED NOT DETECTED Final   Giardia lamblia NOT DETECTED NOT DETECTED Final   Adenovirus F40/41 NOT DETECTED NOT DETECTED Final   Astrovirus NOT DETECTED NOT DETECTED Final   Norovirus GI/GII NOT DETECTED NOT DETECTED Final   Rotavirus A NOT DETECTED NOT DETECTED Final   Sapovirus (I, II, IV, and V) NOT DETECTED NOT DETECTED Final    Comment: Performed at [facility], [address], [place], [place] [address]      Studies:  No results found.  Assessment: 82 y.o. African-American female with recently diagnosed metastatic pancreatic cancer, PE, presented with profound fatigue, low appetite and diarrhea after chemo.  1.  Generalized weakness, secondary to chemotherapy 2.  Anorexia, nausea, secondary to chemotherapy and underlying malignancy 3.  Neutropenia and anemia, secondary to chemotherapy 4.  Metastatic pancreatic cancer, status post chemotherapy gemcitabine and Abraxane last week 5. History of PE  6. HTN 7.  Transaminitis 8. Diarrhea secondary to chemo, GI panel (-) 9. Neutropenic fever   Plan:  -pt spiked fever this morning, she is neutropenic, Dr. Alfredia Ferguson has started her on zosyn, and repeated blood and urine cultures. Her previous blood cx  (-), urine culture showed multiple species -I ordered granix 442mcg this morning, will continue until ANC>=1.5  -she is recovering, I encourage her to eat and drink more, and continue exercise  -she is likely going to SNF for rehab -I again explained to her that her port placement will be postponed due to her neutropenic fever  -will hold chemo until she recovers well  -please call my on-call partners for questions during the weekend, and I will be back on Monday. -I spoke with Dr. Alfredia Ferguson this morning.    Truitt Merle, MD 03/31/2018  7:37 PM

## 2018-03-31 NOTE — Progress Notes (Signed)
Pharmacy Antibiotic Note  Lauren Richard is a 82 y.o. female admitted on 03/29/2018 with generalized weakness, N/V/D after chemo.  She has now developed febrile neutropenia.  Pharmacy has been consulted for Zosyn dosing with possible intra-abdominal source.  Tm 100.7 WBC 2.8, ANC 0.8 Granix ordered   Plan:  Zosyn 3.375g IV Q8H infused over 4hrs.   Pharmacy will sign off note writing, but will continue to f/u peripherally.   Weight: 140 lb (63.5 kg)  Temp (24hrs), Avg:99.4 F (37.4 C), Min:98.3 F (36.8 C), Max:100.7 F (38.2 C)  Recent Labs  Lab 03/29/18 1200 03/29/18 1319 03/30/18 0539 03/30/18 0847 03/30/18 1123 03/30/18 2018 03/30/18 2154 03/31/18 0552  WBC 0.9*  --  1.7*  --   --   --   --  2.8*  CREATININE 1.06*  --  0.79  --   --   --   --  0.76  LATICACIDVEN  --  2.84*  --  1.6 3.3* 1.4 1.5  --     Estimated Creatinine Clearance: 54.5 mL/min (by C-G formula based on SCr of 0.76 mg/dL).    No Known Allergies  Antimicrobials this admission: 6/21 Zosyn >>   Dose adjustments this admission:  Microbiology results: 6/19 BCx: ngtd 6/19 UCx: multiple species 6/21 GI Panel: 6/21 BCx: 6/21 UCx:   Thank you for allowing pharmacy to be a part of this patient's care.  Gretta Arab PharmD, BCPS Pager [phone] 03/31/2018 8:44 AM

## 2018-03-31 NOTE — Progress Notes (Signed)
PROGRESS NOTE    Lauren Richard  AYT:[id number] DOB: Nov 13, 1935 DOA: 03/29/2018 PCP: Glendale Chard, MD   Brief Narrative:  Lauren Richard is a 82 y.o. female with medical history significant of metastatic pancreatic cancer, HTN, GERD, recent dx PE on xarelto who presents with generalized weakness.  She states that her chemotherapy regimen was increased about a week ago.  Since then, she has noticed generalized weakness, nausea, vomiting 2-3 episodes this morning, diarrhea since Sunday 2-3 episodes.  States that she feels weak overall, even taking a shower this morning took much effort.  She has no other complaints of fevers, new or worsening cough, shortness of breath, chest pain, abdominal pain, dysuria. Admitted for Generalized Weakness, Nausea, Vomiting, and Diarrhea and is slowly improving.   Assessment & Plan:   Principal Problem:   Generalized weakness Active Problems:   Hyperlipidemia   Pancreatic mass   Hypokalemia   Adenocarcinoma of head of pancreas (HCC)   Chemotherapy induced neutropenia (HCC)   Nausea & vomiting   Diarrhea  Neutropenic Fever -Patient spiked a temperature of 100.7 this morning and her ANC is 0.8 and WBC is 2.8 -Blood cultures obtained in ED and showed no growth to date at 2 days however will repeat blood cultures x2 given her fever this morning --Urinalysis showed hazy appearance with amber color, small hemoglobin, small leukocytes, rare bacteria, 11-20 squamous epithelial cells and 6-10 urine WBCs -Urine culture showed multiple species present as suggested recollection; however patient is not complaining of any urinary complaints or symptoms but will repeat urinalysis and urine culture given her neutropenic fever -Continue to Monitor Daily CBC; WBC increased from 0.9 -> 1.7 -> 2.8 -Check GI pathogen panel given her recent diarrhea -Empirically start IV Zosyn for coverage for neutropenic fever -Continue to monitor cultures and repeat CBC with  differential in the a.m. -Patient to receive Tbo-Filgrastim 480 mcg subcu once today at the recommendation of oncology  Generalized Weakness -Supportive care as below -PT/ OT evaluated and Treated and recommending SNF vs. HHPT if improves -Social Work [employer] in anticipation for SNF discharge and working on placement  Nausea, Vomiting, Diarrhea, improving -Likely secondary to chemotherapy -C/w Supportive Care and Antiemetics prn with Zofran 4 mg po/IV q6hprn -C/w IVF with NS at 100 mL/hr -Diet Advanced to Regular Diet  -Check GI Pathogen Panel for Diarrhea; Likley Diarrhea was Chemotherapy induced though  -Patient was started on mirtazapine 7.5 mg nightly for appetite stimulant by her primary oncologist  Lactic Acidosis -Likely from Above -Give NS 1 Liter bolus today and will continue IVF Maintenance at 100 mL/hr -LA went from 2.84 and then trended down to 1.6 and is now back up to 3.3; LA now trended down and is 1.5  Hypokalemia -Patient's K+ was 2.9 and improved to 4.3 this AM -Replete with potassium chloride 40 mg p.o. twice daily x2 doses along with IV KCL 40 mEQ yesterday  -Continue to monitor and replete as necessary -Repeat CMP in a.m.  Hypomagnesemia -Magnesium level is now 1.5 -Replete with IV mag sulfate 2 g -Continue to monitor and replete as necessary -Repeat Magnesium level in the a.m.  Hypophosphatemia -Patient's phosphorus level this morning is 1.3 -Replete with IV 30 mmol of potassium phosphate -Continue to monitor and replete as necessary -Repeat phosphorus level in the a.m.  Metastatic Pancreatic Cancer (Adenocarcinoma of the Head of the Pancreas)  -Follows with Dr. Burr Medico and recently received Gemcitabine and Abraxane last week -CA 19-9 was 126 earlier this AM -Notified  Dr. Burr Medico of Patient's Admission initiated evaluation recommendations. -Dr. Morey Hummingbird started the patient on mirtazapine 7.5 mg p.o. nightly to see if this helps her appetite -Dr. Morey Hummingbird  recommending follow-up 1 week after discharge to reevaluate her candidacy for chemotherapy in outpatient setting.  We will not have a port placed while she is here  Recently  PE -On Rivaroxaban 15 mg po BID x 3 more days to complete initial 21 days and then will be placed on Xarelto 20 mg po Daily with supper   Transaminitis/Abnormal LFT's -Possibly related to chemotherapy, as noted by Dr. Ernestina Penna note -Stable from previous labs and slightly Trending down -AST is now down from 217 and is 117 -ALT is now down from 110 and is now 58 -Continue to Monitor and Trend LFT's; Repeat CMP in AM   HTN -C/w Amlodipine 5 mg po Daily and with Atenolol 25 mg po Daily   GERD -C/w Pantoprazole 40 mg po Daily   Normocytic Anemia -Patient's hemoglobin/hematocrit went from 8.1/24.7 is now 8.0/24.5 -After discussion with patient's primary oncologist Dr. Morey Hummingbird she recommends transfusion of 1 unit PRBCs -Type and screen ordered 1 unit to be transfused -Therapy induced -Continue to monitor for signs and symptoms of bleeding -Repeat CBC in the a.m.  DVT prophylaxis: Anticoagulated with Rivaroxaban 15 mg po BID Code Status: FULL CODE Family Communication: No family present at bedside  Disposition Plan: Re  Consultants:   Medical Oncology Dr. Burr Medico   Procedures: None  Antimicrobials: Anti-infectives (From admission, onward)   Start     Dose/Rate Route Frequency Ordered Stop   03/31/18 0900  piperacillin-tazobactam (ZOSYN) IVPB 3.375 g     3.375 g 12.5 mL/hr over 240 Minutes Intravenous Every 8 hours 03/31/18 0843       Subjective: Seen and examined and felt okay still had a little bit of nausea and some diarrhea but states that she is improving.  No chest pain, shortness breath, lightheadedness or dizziness.  No other concerns complaints at this time but still feels little weak.  Objective: Vitals:   03/30/18 2027 03/31/18 0515 03/31/18 0733 03/31/18 1211  BP: 115/90 (!) 155/77  114/69    Pulse: 88 98  89  Resp: 18 17  (!) 24  Temp: 99.7 F (37.6 C) (!) 100.7 F (38.2 C) 98.3 F (36.8 C) 97.8 F (36.6 C)  TempSrc: Oral Oral Oral   SpO2: 100% 97%  100%  Weight:        Intake/Output Summary (Last 24 hours) at 03/31/2018 1215 Last data filed at 03/31/2018 1000 Gross per 24 hour  Intake 2263.33 ml  Output 0 ml  Net 2263.33 ml   Filed Weights   03/29/18 1101  Weight: 63.5 kg (140 lb)   Examination: Physical Exam:  Constitutional: Thin elderly African-American female who is currently in no acute distress appears calm and comfortable and does appear younger than her stated age. Eyes: Sclera are anicteric.  Lids and conjunctive are normal. ENMT: External ears and nose appear normal Neck: Supple with no JVD Respiratory: Diminished to auscultation bilaterally with no appreciable wheezing, rales, rhonchi.  Unlabored breathing and is not using accessory muscles to breathe. Cardiovascular: Regular rate and rhythm with no appreciable murmurs, rubs, gallops. Abdomen: Soft, nontender, nondistended.  Bowel sounds present all 4 quadrants GU: Deferred Musculoskeletal: No contractures or cyanosis noted.  No joint deformities Skin: No rashes or lesions on limited skin evaluation.  Skin is warm dry Neurologic: Cranial nerves II through XII grossly intact with no  appreciable focal deficits Psychiatric: Alert and oriented x3.  Did not remember any from yesterday.  Normal insight and judgment.  Pleasant mood and affect.  Data Reviewed: I have personally reviewed following labs and imaging studies  CBC: Recent Labs  Lab 03/29/18 1200 03/30/18 0539 03/31/18 0552  WBC 0.9* 1.7* 2.8*  NEUTROABS 0.4  --  0.8*  HGB 8.8* 8.1* 8.0*  HCT 27.6* 24.7* 24.5*  MCV 89.9 90.8 91.4  PLT 172 154 505   Basic Metabolic Panel: Recent Labs  Lab 03/29/18 1200 03/30/18 0539 03/31/18 0552  NA 138 141 140  K 2.7* 2.9* 4.3  CL 104 109 114*  CO2 26 25 21*  GLUCOSE 141* 106* 100*  BUN 8 6  <5*  CREATININE 1.06* 0.79 0.76  CALCIUM 8.9 8.6* 8.6*  MG 1.4* 1.9 1.5*  PHOS  --   --  1.3*   GFR: Estimated Creatinine Clearance: 54.5 mL/min (by C-G formula based on SCr of 0.76 mg/dL). Liver Function Tests: Recent Labs  Lab 03/29/18 1200 03/30/18 0539 03/31/18 0552  AST 172* 146* 117*  ALT 82* 70* 58*  ALKPHOS 92 78 70  BILITOT 1.1 1.0 0.9  PROT 7.3 6.4* 6.1*  ALBUMIN 2.9* 2.5* 2.4*   No results for input(s): LIPASE, AMYLASE in the last 168 hours. No results for input(s): AMMONIA in the last 168 hours. Coagulation Profile: No results for input(s): INR, PROTIME in the last 168 hours. Cardiac Enzymes: No results for input(s): CKTOTAL, CKMB, CKMBINDEX, TROPONINI in the last 168 hours. BNP (last 3 results) No results for input(s): PROBNP in the last 8760 hours. HbA1C: No results for input(s): HGBA1C in the last 72 hours. CBG: No results for input(s): GLUCAP in the last 168 hours. Lipid Profile: No results for input(s): CHOL, HDL, LDLCALC, TRIG, CHOLHDL, LDLDIRECT in the last 72 hours. Thyroid Function Tests: No results for input(s): TSH, T4TOTAL, FREET4, T3FREE, THYROIDAB in the last 72 hours. Anemia Panel: No results for input(s): VITAMINB12, FOLATE, FERRITIN, TIBC, IRON, RETICCTPCT in the last 72 hours. Sepsis Labs: Recent Labs  Lab 03/30/18 0847 03/30/18 1123 03/30/18 2018 03/30/18 2154  LATICACIDVEN 1.6 3.3* 1.4 1.5    Recent Results (from the past 240 hour(s))  Blood Culture (routine x 2)     Status: None (Preliminary result)   Collection Time: 03/29/18 12:38 PM  Result Value Ref Range Status   Specimen Description   Final    BLOOD LEFT HAND Performed at [facility], [place] [address], [place], [place] [address]    Special Requests   Final    BOTTLES DRAWN AEROBIC AND ANAEROBIC Blood Culture results may not be optimal due to an inadequate volume of blood received in culture bottles Performed at [facility]  [address] [place], [place] [address]    Culture   Final    NO GROWTH 2 DAYS Performed at [facility] [address], [place], [place] [address]    Report Status PENDING  Incomplete  Blood Culture (routine x 2)     Status: None (Preliminary result)   Collection Time: 03/29/18  3:40 PM  Result Value Ref Range Status   Specimen Description   Final    BLOOD RIGHT HAND Performed at [facility] [address] [place], [place] [address]    Special Requests   Final    BOTTLES DRAWN AEROBIC ONLY Blood Culture results may not be optimal due to an inadequate volume of blood received in  culture bottles Performed at [facility] [address], [place], [place] [address]    Culture   Final    NO GROWTH 2 DAYS Performed at [facility] [address], [place], [place] [address]    Report Status PENDING  Incomplete  Urine culture     Status: Abnormal   Collection Time: 03/29/18  5:38 PM  Result Value Ref Range Status   Specimen Description   Final    URINE, RANDOM Performed at [facility] [address] [place], [place] [address]    Special Requests   Final    NONE Performed at [facility], [place] [address] [place], [place] [address]    Culture MULTIPLE SPECIES PRESENT, SUGGEST RECOLLECTION (A)  Final   Report Status 03/30/2018 FINAL  Final     Radiology Studies: No results found.  Scheduled Meds: . sodium chloride   Intravenous Once  . amLODipine  5 mg Oral Daily  . aspirin  81 mg Oral Daily  . atenolol  25 mg Oral Daily  . gabapentin  200 mg Oral BID  . mirtazapine  7.5 mg Oral QHS  . pantoprazole  40 mg Oral Q1200  . rivaroxaban  15 mg Oral BID WC   Followed by  . [START ON 04/01/2018] rivaroxaban  20 mg Oral Q supper  . Tbo-filgastrim (GRANIX) SQ  480 mcg Subcutaneous Once   Continuous Infusions: . sodium chloride 100 mL/hr at 03/31/18 1046  . piperacillin-tazobactam (ZOSYN)   IV    . potassium PHOSPHATE IVPB (in mmol) 30 mmol (03/31/18 0959)    LOS: 2 days   Kerney Elbe, DO [facility] Pager [phone]  If 7PM-7AM, please contact night-coverage [email or web address] Password [facility] 03/31/2018, 12:15 PM

## 2018-04-01 LAB — CBC WITH DIFFERENTIAL/PLATELET
Band Neutrophils: 6 %
Basophils Absolute: 0.1 10*3/uL (ref 0.0–0.1)
Basophils Relative: 1 %
EOS ABS: 0.3 10*3/uL (ref 0.0–0.7)
Eosinophils Relative: 5 %
HCT: 29.6 % — ABNORMAL LOW (ref 36.0–46.0)
Hemoglobin: 9.8 g/dL — ABNORMAL LOW (ref 12.0–15.0)
LYMPHS PCT: 32 %
Lymphs Abs: 2 10*3/uL (ref 0.7–4.0)
MCH: 29.7 pg (ref 26.0–34.0)
MCHC: 33.1 g/dL (ref 30.0–36.0)
MCV: 89.7 fL (ref 78.0–100.0)
MONO ABS: 1.5 10*3/uL — AB (ref 0.1–1.0)
MONOS PCT: 23 %
Metamyelocytes Relative: 3 %
Myelocytes: 1 %
NEUTROS PCT: 29 %
NRBC: 7 /100{WBCs} — AB
Neutro Abs: 2.5 10*3/uL (ref 1.7–7.7)
PLATELETS: 205 10*3/uL (ref 150–400)
RBC: 3.3 MIL/uL — ABNORMAL LOW (ref 3.87–5.11)
RDW: 17.6 % — AB (ref 11.5–15.5)
WBC: 6.4 10*3/uL (ref 4.0–10.5)

## 2018-04-01 LAB — COMPREHENSIVE METABOLIC PANEL
ALT: 48 U/L (ref 14–54)
ANION GAP: 7 (ref 5–15)
AST: 87 U/L — ABNORMAL HIGH (ref 15–41)
Albumin: 2.2 g/dL — ABNORMAL LOW (ref 3.5–5.0)
Alkaline Phosphatase: 76 U/L (ref 38–126)
BUN: <5 mg/dL — ABNORMAL LOW (ref 6–20)
CHLORIDE: 112 mmol/L — AB (ref 101–111)
CO2: 19 mmol/L — ABNORMAL LOW (ref 22–32)
CREATININE: 0.75 mg/dL (ref 0.44–1.00)
Calcium: 8.4 mg/dL — ABNORMAL LOW (ref 8.9–10.3)
GFR calc Af Amer: >60 mL/min (ref >60)
Glucose, Bld: 90 mg/dL (ref 65–99)
Potassium: 3.9 mmol/L (ref 3.5–5.1)
Sodium: 138 mmol/L (ref 135–145)
Total Bilirubin: 1.3 mg/dL — ABNORMAL HIGH (ref 0.3–1.2)
Total Protein: 5.9 g/dL — ABNORMAL LOW (ref 6.5–8.1)

## 2018-04-01 LAB — PHOSPHORUS: Phosphorus: 3.2 mg/dL (ref 2.5–4.6)

## 2018-04-01 LAB — URINE CULTURE

## 2018-04-01 LAB — MAGNESIUM: MAGNESIUM: 1.7 mg/dL (ref 1.7–2.4)

## 2018-04-01 MED ORDER — MAGNESIUM SULFATE 2 GM/50ML IV SOLN
2.0000 g | Freq: Once | INTRAVENOUS | Status: AC
  Administered 2018-04-01: 2 g via INTRAVENOUS
  Filled 2018-04-01: qty 50

## 2018-04-01 NOTE — Progress Notes (Signed)
PROGRESS NOTE    Lauren Richard  TIW:[id number] DOB: 22-Nov-1935 DOA: 03/29/2018 PCP: Glendale Chard, MD   Brief Narrative:  Lauren Richard is a 82 y.o. female with medical history significant of metastatic pancreatic cancer, HTN, GERD, recent dx PE on xarelto who presents with generalized weakness.  She states that her chemotherapy regimen was increased about a week ago.  Since then, she has noticed generalized weakness, nausea, vomiting 2-3 episodes this morning, diarrhea since Sunday 2-3 episodes.  States that she feels weak overall, even taking a shower this morning took much effort.  She has no other complaints of fevers, new or worsening cough, shortness of breath, chest pain, abdominal pain, dysuria. Admitted for Generalized Weakness, Nausea, Vomiting, and Diarrhea and is slowly improving then subsequently developed neutropenic fever and spiked a temperature of 101.3 overnight.  Recommending Granix and stopping once ANC is greater than 1500 and also recommended transfusing 1 unit of PRBCs and continue with empiric antibiotics at this time.  Assessment & Plan:   Principal Problem:   Generalized weakness Active Problems:   Hyperlipidemia   Pancreatic mass   Hypokalemia   Adenocarcinoma of head of pancreas (HCC)   Chemotherapy induced neutropenia (HCC)   Nausea & vomiting   Diarrhea  Neutropenic Fever -Patient spiked a temperature of 100.7 yesterday morning and her ANC was 0.8; She spiked a temperature overnight of 101.3 and ANC is now 2,500 -WBC improved and is now 6.4 -Blood cultures obtained in ED and showed no growth to date at 2 days -Repeat Blood Cx x2 pending  -Initial Urinalysis showed hazy appearance with amber color, small hemoglobin, small leukocytes, rare bacteria, 11-20 squamous epithelial cells and 6-10 urine WBCs -Urine culture showed multiple species present as suggested recollection; however patient is not complaining of any urinary complaints or symptoms but will  repeat urinalysis and urine culture given her neutropenic fever (Repeated prior to Abx administration)  -Repeat Urinalysis unremarkable and repeat Urine Cx pending  -Continue to Monitor Daily CBC; WBC increasing from 0.9 -> 1.7 -> 2.8 -> 6.4 -Checked GI pathogen panel given her recent diarrhea and is Negative -Empirically start IV Zosyn for coverage for neutropenic fever -Continue to monitor cultures and repeat CBC with differential in the a.m. -Patient recieved Tbo-Filgrastim 480 mcg subcu once yesterday at the recommendation of oncology and was started on 300 mcg subq daily but stopped today as ANC was 2,500 and >1,500 -Continue to Monitor   Generalized Weakness -Supportive care as below -PT/ OT evaluated and Treated and recommending SNF vs. HHPT if improves -Social Work Consulted in anticipation for SNF discharge and working on placement for patient and may D/C in next 24-48 hours if medically stable   Nausea, Vomiting, Diarrhea, improving -Likely secondary to chemotherapy -C/w Supportive Care and Antiemetics prn with Zofran 4 mg po/IV q6hprn -C/w IVF NS but reduced rate from 100 mL/hr to 75 mL/hr -Diet Advanced to Regular Diet  -Checked GI Pathogen Panel for Diarrhea and Negative; Likley Diarrhea was Chemotherapy induced though  -Patient was started on mirtazapine 7.5 mg nightly for appetite stimulant by her primary oncologist  Lactic Acidosis -Likely from Above and Significantly improved -Give NS 1 Liter bolus today and will continue IVF Maintenance at 100 mL/hr -LA went from 2.84 and then trended down to 1.6 and is now back up to 3.3; LA now trended down and is 1.5 -Will not recheck   Hypokalemia -Patient's K+ was 2.9 and improved to 3.9 this AM -Continue to monitor and  replete as necessary -Repeat CMP in a.m.  Hypomagnesemia -Magnesium level is now 1.7 -Replete with IV mag sulfate 2 g again  -Continue to monitor and replete as necessary -Repeat Magnesium level in the  a.m.  Hypophosphatemia -Patient's phosphorus level was 1.3 and improved to 3.2 -Replete with IV 30 mmol of potassium phosphate yesterday  -Continue to monitor and replete as necessary -Repeat phosphorus level in the a.m.  Metastatic Pancreatic Cancer (Adenocarcinoma of the Head of the Pancreas)  -Follows with Dr. Burr Medico and recently received Gemcitabine and Abraxane last week -CA 19-9 was 126 earlier this Month -Notified Dr. Burr Medico of Patient's Admission initiated evaluation recommendations. -Dr. Morey Hummingbird started the patient on mirtazapine 7.5 mg p.o. nightly to see if this helps her appetite -Dr. Morey Hummingbird recommending follow-up 1 week after discharge to reevaluate her candidacy for chemotherapy in outpatient setting.  We will not have a port placed while she is here -C/w Pain control with Tramadol 50 mg po q12hprn Moderate Pain  Recently  PE -On Rivaroxaban 15 mg po BID x 2 more days to complete initial 21 days and then will be placed on Xarelto 20 mg po Daily with supper   Transaminitis/Abnormal LFT's, improving  -Possibly related to chemotherapy, as noted by Dr. Ernestina Penna note -Stable from previous labs and slightly Trending down -AST is now down from 217 and is 87 -ALT is now down from 110 and is now 48 -Continue to Monitor and Trend LFT's; Repeat CMP in AM   HTN -C/w Amlodipine 5 mg po Daily and with Atenolol 25 mg po Daily   GERD -C/w Pantoprazole 40 mg po Daily   Normocytic Anemia -Patient's hemoglobin/hematocrit dropped to 8.0/24.5 -After discussion with patient's primary oncologist Dr. Morey Hummingbird she recommends transfusion of 1 unit PRBCs -Type and screen ordered 1 unit to be transfused and after Transfusion her Hb/Hct improved to 9.8/29.6 -Likely Chemotherapy Induced Anemia  -Continue to monitor for signs and symptoms of bleeding -Repeat CBC in the a.m.  Hyperbilirubinemia -Mild. Patient's T bili went from 0.9 is now 1.3 -Continue to monitor and repeat CMP in a.m.  DVT  prophylaxis: Anticoagulated with Rivaroxaban 15 mg po BID Code Status: FULL CODE Family Communication: No family present at bedside  Disposition Plan: Re  Consultants:   Medical Oncology Dr. Burr Medico   Procedures: None  Antimicrobials: Anti-infectives (From admission, onward)   Start     Dose/Rate Route Frequency Ordered Stop   03/31/18 0900  piperacillin-tazobactam (ZOSYN) IVPB 3.375 g     3.375 g 12.5 mL/hr over 240 Minutes Intravenous Every 8 hours 03/31/18 0843       Subjective: Seen and examined and stated she was nauseous and vomited after dinner last night and spiked a temperature of 101.3  No chest pain, shortness breath, nausea today.  Still complaining of her diarrhea states that it is better from when she came in and slowed a little bit but still having it.  No other concerns or complaints at this time.  Objective: Vitals:   03/31/18 2000 03/31/18 2128 04/01/18 0540 04/01/18 0940  BP: 113/64  (!) 105/55 120/67  Pulse: 68  91 90  Resp: (!) 28  14   Temp: (!) 101.3 F (38.5 C) (!) 101 F (38.3 C) 99.3 F (37.4 C) 98.3 F (36.8 C)  TempSrc: Oral  Oral Oral  SpO2: 96%  97%   Weight:        Intake/Output Summary (Last 24 hours) at 04/01/2018 1115 Last data filed at 04/01/2018 0930 Gross  per 24 hour  Intake 3207.21 ml  Output -  Net 3207.21 ml   Filed Weights   03/29/18 1101  Weight: 63.5 kg (140 lb)   Examination: Physical Exam:  Constitutional: Thin elderly African-American female is currently in no acute distress and appears very calm and comfortable.  She is very pleasant to talk to. Eyes: Sclera anicteric.  Lids and conjunctive normal. ENMT: External ears and nose appear normal Neck: Neck appears supple with no JVD Respiratory: Auscultation bilaterally with no appreciable wheezing, rales, rhonchi.  Patient has unlabored breathing and is not using accessory muscles to breathe. Cardiovascular: Regular rate and rhythm.  With no appreciable murmurs, rubs,  gallops.  No lower extremity edema noted. Abdomen: Soft, nontender, nondistended.  Bowel sounds present all 4 quadrants GU: Deferred Musculoskeletal: No contractures cyanosis noted.  No joint deformities Skin: Skin is warm and dry with no appreciable rashes or lesions on limited skin evaluation Neurologic: Cranial nerves II through XII grossly intact no appreciable focal deficits.  Romberg sign and cerebellar reflexes not assessed Psychiatric: Intact judgment and insight.  Patient is awake, alert, and oriented x3.  Pleasant mood and affect  Data Reviewed: I have personally reviewed following labs and imaging studies  CBC: Recent Labs  Lab 03/29/18 1200 03/30/18 0539 03/31/18 0552 04/01/18 0557  WBC 0.9* 1.7* 2.8* 6.4  NEUTROABS 0.4  --  0.8* 2.5  HGB 8.8* 8.1* 8.0* 9.8*  HCT 27.6* 24.7* 24.5* 29.6*  MCV 89.9 90.8 91.4 89.7  PLT 172 154 164 665   Basic Metabolic Panel: Recent Labs  Lab 03/29/18 1200 03/30/18 0539 03/31/18 0552 04/01/18 0557  NA 138 141 140 138  K 2.7* 2.9* 4.3 3.9  CL 104 109 114* 112*  CO2 26 25 21* 19*  GLUCOSE 141* 106* 100* 90  BUN 8 6 <5* <5*  CREATININE 1.06* 0.79 0.76 0.75  CALCIUM 8.9 8.6* 8.6* 8.4*  MG 1.4* 1.9 1.5* 1.7  PHOS  --   --  1.3* 3.2   GFR: Estimated Creatinine Clearance: 54.5 mL/min (by C-G formula based on SCr of 0.75 mg/dL). Liver Function Tests: Recent Labs  Lab 03/29/18 1200 03/30/18 0539 03/31/18 0552 04/01/18 0557  AST 172* 146* 117* 87*  ALT 82* 70* 58* 48  ALKPHOS 92 78 70 76  BILITOT 1.1 1.0 0.9 1.3*  PROT 7.3 6.4* 6.1* 5.9*  ALBUMIN 2.9* 2.5* 2.4* 2.2*   No results for input(s): LIPASE, AMYLASE in the last 168 hours. No results for input(s): AMMONIA in the last 168 hours. Coagulation Profile: No results for input(s): INR, PROTIME in the last 168 hours. Cardiac Enzymes: No results for input(s): CKTOTAL, CKMB, CKMBINDEX, TROPONINI in the last 168 hours. BNP (last 3 results) No results for input(s): PROBNP in  the last 8760 hours. HbA1C: No results for input(s): HGBA1C in the last 72 hours. CBG: No results for input(s): GLUCAP in the last 168 hours. Lipid Profile: No results for input(s): CHOL, HDL, LDLCALC, TRIG, CHOLHDL, LDLDIRECT in the last 72 hours. Thyroid Function Tests: No results for input(s): TSH, T4TOTAL, FREET4, T3FREE, THYROIDAB in the last 72 hours. Anemia Panel: No results for input(s): VITAMINB12, FOLATE, FERRITIN, TIBC, IRON, RETICCTPCT in the last 72 hours. Sepsis Labs: Recent Labs  Lab 03/30/18 0847 03/30/18 1123 03/30/18 2018 03/30/18 2154  LATICACIDVEN 1.6 3.3* 1.4 1.5    Recent Results (from the past 240 hour(s))  Blood Culture (routine x 2)     Status: None (Preliminary result)   Collection Time: 03/29/18 12:38  PM  Result Value Ref Range Status   Specimen Description   Final    BLOOD LEFT HAND Performed at [facility] [address], [place], [place] [address]    Special Requests   Final    BOTTLES DRAWN AEROBIC AND ANAEROBIC Blood Culture results may not be optimal due to an inadequate volume of blood received in culture bottles Performed at [facility] [address] [place], [place] [address]    Culture   Final    NO GROWTH 2 DAYS Performed at [facility] [address] [place], [place] [address]    Report Status PENDING  Incomplete  Blood Culture (routine x 2)     Status: None (Preliminary result)   Collection Time: 03/29/18  3:40 PM  Result Value Ref Range Status   Specimen Description   Final    BLOOD RIGHT HAND Performed at [facility] [address], [place], [place] [address]    Special Requests   Final    BOTTLES DRAWN AEROBIC ONLY Blood Culture results may not be optimal due to an inadequate volume of blood received in culture bottles Performed at [facility] [address], [place], [place] [address]    Culture   Final    NO GROWTH 2  DAYS Performed at [facility] [address] [place], [place] [address]    Report Status PENDING  Incomplete  Urine culture     Status: Abnormal   Collection Time: 03/29/18  5:38 PM  Result Value Ref Range Status   Specimen Description   Final    URINE, RANDOM Performed at [facility] [address] [place], [place] [address]    Special Requests   Final    NONE Performed at [facility], [place] [address] [place], [place] [address]    Culture MULTIPLE SPECIES PRESENT, SUGGEST RECOLLECTION (A)  Final   Report Status 03/30/2018 FINAL  Final  Gastrointestinal Panel by PCR , Stool     Status: None   Collection Time: 03/31/18  8:25 AM  Result Value Ref Range Status   Campylobacter species NOT DETECTED NOT DETECTED Final   Plesimonas shigelloides NOT DETECTED NOT DETECTED Final   Salmonella species NOT DETECTED NOT DETECTED Final   Yersinia enterocolitica NOT DETECTED NOT DETECTED Final   Vibrio species NOT DETECTED NOT DETECTED Final   Vibrio cholerae NOT DETECTED NOT DETECTED Final   Enteroaggregative E coli (EAEC) NOT DETECTED NOT DETECTED Final   Enteropathogenic E coli (EPEC) NOT DETECTED NOT DETECTED Final   Enterotoxigenic E coli (ETEC) NOT DETECTED NOT DETECTED Final   Shiga like toxin producing E coli (STEC) NOT DETECTED NOT DETECTED Final   Shigella/Enteroinvasive E coli (EIEC) NOT DETECTED NOT DETECTED Final   Cryptosporidium NOT DETECTED NOT DETECTED Final   Cyclospora cayetanensis NOT DETECTED NOT DETECTED Final   Entamoeba histolytica NOT DETECTED NOT DETECTED Final   Giardia lamblia NOT DETECTED NOT DETECTED Final   Adenovirus F40/41 NOT DETECTED NOT DETECTED Final   Astrovirus NOT DETECTED NOT DETECTED Final   Norovirus GI/GII NOT DETECTED NOT DETECTED Final   Rotavirus A NOT DETECTED NOT DETECTED Final   Sapovirus (I, II, IV, and V) NOT DETECTED NOT DETECTED Final    Comment: Performed at [facility], [address], [place],  [address]    Radiology Studies: No results found.  Scheduled Meds: . amLODipine  5 mg Oral  Daily  . aspirin  81 mg Oral Daily  . atenolol  25 mg Oral Daily  . gabapentin  200 mg Oral BID  . mirtazapine  7.5 mg Oral QHS  . pantoprazole  40 mg Oral Q1200  . rivaroxaban  20 mg Oral Q supper   Continuous Infusions: . sodium chloride 100 mL/hr at 03/31/18 1046  . piperacillin-tazobactam (ZOSYN)  IV 3.375 g (04/01/18 0855)    LOS: 3 days   Kerney Elbe, DO [facility] Pager [phone]  If 7PM-7AM, please contact night-coverage [email or web address] Password [facility] 04/01/2018, 11:15 AM

## 2018-04-01 NOTE — Progress Notes (Signed)
Patient's niece is leaving the country tonight.   Please contact [facility] or [employer].   Plan is for patient to dc to [facility] once medically stable. Patient needs auth.   Carolin Coy [place]

## 2018-04-02 ENCOUNTER — Inpatient Hospital Stay (HOSPITAL_COMMUNITY): Payer: PPO

## 2018-04-02 LAB — PHOSPHORUS: Phosphorus: 3.1 mg/dL (ref 2.5–4.6)

## 2018-04-02 LAB — CBC WITH DIFFERENTIAL/PLATELET
BASOS PCT: 0 %
Band Neutrophils: 15 %
Basophils Absolute: 0 10*3/uL (ref 0.0–0.1)
EOS PCT: 0 %
Eosinophils Absolute: 0 10*3/uL (ref 0.0–0.7)
HEMATOCRIT: 27.5 % — AB (ref 36.0–46.0)
Hemoglobin: 9.5 g/dL — ABNORMAL LOW (ref 12.0–15.0)
LYMPHS ABS: 2.7 10*3/uL (ref 0.7–4.0)
Lymphocytes Relative: 22 %
MCH: 31.3 pg (ref 26.0–34.0)
MCHC: 34.5 g/dL (ref 30.0–36.0)
MCV: 90.5 fL (ref 78.0–100.0)
Monocytes Absolute: 1.6 10*3/uL — ABNORMAL HIGH (ref 0.1–1.0)
Monocytes Relative: 13 %
Neutro Abs: 8.1 10*3/uL — ABNORMAL HIGH (ref 1.7–7.7)
Neutrophils Relative %: 50 %
Platelets: 200 10*3/uL (ref 150–400)
RBC: 3.04 MIL/uL — AB (ref 3.87–5.11)
RDW: 18.5 % — AB (ref 11.5–15.5)
WBC: 12.4 10*3/uL — AB (ref 4.0–10.5)

## 2018-04-02 LAB — COMPREHENSIVE METABOLIC PANEL
ALT: 36 U/L (ref 14–54)
ANION GAP: 6 (ref 5–15)
AST: 58 U/L — ABNORMAL HIGH (ref 15–41)
Albumin: 1.8 g/dL — ABNORMAL LOW (ref 3.5–5.0)
Alkaline Phosphatase: 79 U/L (ref 38–126)
BILIRUBIN TOTAL: 0.9 mg/dL (ref 0.3–1.2)
BUN: 5 mg/dL — AB (ref 6–20)
CALCIUM: 8.2 mg/dL — AB (ref 8.9–10.3)
CO2: 20 mmol/L — ABNORMAL LOW (ref 22–32)
CREATININE: 0.81 mg/dL (ref 0.44–1.00)
Chloride: 112 mmol/L — ABNORMAL HIGH (ref 101–111)
GFR calc Af Amer: >60 mL/min (ref >60)
Glucose, Bld: 68 mg/dL (ref 65–99)
POTASSIUM: 3.4 mmol/L — AB (ref 3.5–5.1)
Sodium: 138 mmol/L (ref 135–145)
Total Protein: 4.9 g/dL — ABNORMAL LOW (ref 6.5–8.1)

## 2018-04-02 LAB — MAGNESIUM: Magnesium: 1.6 mg/dL — ABNORMAL LOW (ref 1.7–2.4)

## 2018-04-02 LAB — GLUCOSE, CAPILLARY
GLUCOSE-CAPILLARY: 83 mg/dL (ref 65–99)
Glucose-Capillary: 67 mg/dL (ref 65–99)
Glucose-Capillary: 69 mg/dL (ref 65–99)

## 2018-04-02 MED ORDER — MAGNESIUM SULFATE 2 GM/50ML IV SOLN
2.0000 g | Freq: Once | INTRAVENOUS | Status: AC
  Administered 2018-04-02: 2 g via INTRAVENOUS
  Filled 2018-04-02: qty 50

## 2018-04-02 MED ORDER — POTASSIUM CHLORIDE CRYS ER 20 MEQ PO TBCR
40.0000 meq | EXTENDED_RELEASE_TABLET | Freq: Two times a day (BID) | ORAL | Status: AC
  Administered 2018-04-02 (×2): 40 meq via ORAL
  Filled 2018-04-02 (×2): qty 2

## 2018-04-02 NOTE — Progress Notes (Signed)
Per [employer], reviewing SNF authorization request- asked if pt will be needing IV antibiotics at DC- per attending do not anticipate this. Informed representative- awaiting response.   Sharren Bridge, MSW, LCSW Clinical Social Work 04/02/2018 [phone] weekend coverage

## 2018-04-02 NOTE — Progress Notes (Signed)
PROGRESS NOTE    Lauren Richard  ZOX:[id number] DOB: 07/19/1936 DOA: 03/29/2018 PCP: Glendale Chard, MD   Brief Narrative:  Lauren Richard is a 82 y.o. female with medical history significant of metastatic pancreatic cancer, HTN, GERD, recent dx PE on xarelto who presents with generalized weakness.  She states that her chemotherapy regimen was increased about a week ago.  Since then, she has noticed generalized weakness, nausea, vomiting 2-3 episodes this morning, diarrhea since Sunday 2-3 episodes.  States that she feels weak overall, even taking a shower this morning took much effort.  She has no other complaints of fevers, new or worsening cough, shortness of breath, chest pain, abdominal pain, dysuria. Admitted for Generalized Weakness, Nausea, Vomiting, and Diarrhea and is slowly improving then subsequently developed neutropenic fever and spiked a temperature of 101.3 overnight. Oncology was consulted and Recommended Granix and stopping once ANC is greater than 1500 and also recommended transfusing 1 unit of PRBCs. Started on Empiric antibiotics at this time with IV Zosyn and will continue today and switch to po Augmentin for total of 5 day course.  Assessment & Plan:   Principal Problem:   Generalized weakness Active Problems:   Hyperlipidemia   Pancreatic mass   Hypokalemia   Adenocarcinoma of head of pancreas (HCC)   Chemotherapy induced neutropenia (HCC)   Nausea & vomiting   Diarrhea  Neutropenic Fever -Patient spiked a TMax of 101.3; ? Mucositis  -WBC improved and is now 12.4 (improved after Granix Administration)  -Blood cultures obtained in ED and showed no growth to date at 3 days -Repeat Blood Cx x2 Showed NGTD at 1 day   -Initial Urinalysis showed hazy appearance with amber color, small hemoglobin, small leukocytes, rare bacteria, 11-20 squamous epithelial cells and 6-10 urine WBCs -Urine culture showed multiple species present as suggested recollection; however patient  is not complaining of any urinary complaints or symptoms but will repeat urinalysis and urine culture given her neutropenic fever (Repeated prior to Abx administration)  -Repeat Urinalysis unremarkable and repeat Urine Cx showed <10,000 CFU of Insignificant Growth  -Continue to Monitor Daily CBC; WBC increasing from 0.9 -> 1.7 -> 2.8 -> 6.4 -> 12.4 -Checked GI pathogen panel given her recent diarrhea and is Negative -CXR obtained and personally reviewed and showed no acute cardiopulmonary disease on my interpretation but official read by Radiologist still pending  -Empirically start IV Zosyn for coverage for neutropenic fever and will continue today and change to po Augmentin to complete 5 day course at the recommendation of Oncology Dr. Irene Limbo  -Continue to monitor cultures and repeat CBC with differential in the a.m. -Patient recieved Tbo-Filgrastim 480 mcg subcu once during hospitalization  -Continue to Monitor   Generalized Weakness -Supportive care as below -PT/ OT evaluated and Treated and recommending SNF vs. HHPT if improves -Social Work [employer] in anticipation for SNF discharge and working on placement for patient and may D/C in next 24-48 hours if medically stable   Nausea, Vomiting, Diarrhea, improving -Likely secondary to chemotherapy; Nausea and vomiting improved and states that diarrhea is improving  -C/w Supportive Care and Antiemetics prn with Zofran 4 mg po/IV q6hprn -C/w IVF NS but reduced rate from 100 mL/hr to 75 mL/hr -Diet Advanced to Regular Diet  -Checked GI Pathogen Panel for Diarrhea and Negative; Likley Diarrhea was Chemotherapy induced though  -Patient was started on mirtazapine 7.5 mg nightly for appetite stimulant by her primary oncologist  Lactic Acidosis -Likely from Above and Significantly improved -Give NS  1 Liter bolus today and will continue IVF Maintenance at 100 mL/hr -LA went from 2.84 and then trended down to 1.6 and is now back up to 3.3; LA now  trended down and is 1.5 -Will not recheck   Hypokalemia -Patient's K+ was 3.4 this AM -Replete with po KCl 40 mEQ x 2 -Continue to monitor and replete as necessary -Repeat CMP in a.m.  Hypomagnesemia -Magnesium level is now 1.6 -Replete with IV mag sulfate 2 g  -Continue to monitor and replete as necessary -Repeat Magnesium level in the a.m.  Hypophosphatemia -Patient's phosphorus level was 1.3 and improved to 3.1 -Replete with IV 30 mmol of potassium phosphate yesterday  -Continue to monitor and replete as necessary -Repeat phosphorus level in the a.m.  Metastatic Pancreatic Cancer (Adenocarcinoma of the Head of the Pancreas)  -Follows with Dr. Burr Medico and recently received Gemcitabine and Abraxane last week -CA 19-9 was 126 earlier this Month -Notified Dr. Burr Medico of Patient's Admission initiated evaluation recommendations. -Dr. Morey Hummingbird started the patient on mirtazapine 7.5 mg p.o. nightly to see if this helps her appetite -Dr. Morey Hummingbird recommending follow-up 1 week after discharge to reevaluate her candidacy for chemotherapy in outpatient setting.  We will not have a port placed while she is here -C/w Pain control with Tramadol 50 mg po q12hprn Moderate Pain  Recently  PE -On Rivaroxaban 15 mg po BID x 2 more days to complete initial 21 days and then will be placed on Xarelto 20 mg po Daily with supper   Transaminitis/Abnormal LFT's, improving  -Possibly related to chemotherapy, as noted by Dr. Ernestina Penna note -Stable from previous labs and slightly Trending down -AST is now down from 217 and is 58 -ALT is now down from 110 and is now 36 -Continue to Monitor and Trend LFT's; Repeat CMP in AM   HTN -C/w Amlodipine 5 mg po Daily and with Atenolol 25 mg po Daily   GERD -C/w Pantoprazole 40 mg po Daily   Normocytic Anemia -Patient's hemoglobin/hematocrit dropped to 8.0/24.5 -After discussion with patient's primary oncologist Dr. Morey Hummingbird she recommends transfusion of 1 unit  PRBCs -Type and screen ordered 1 unit to be transfused and after Transfusion her Hb/Hct improved to 9.8/29.6 and is now 9.5/27.5 -Likely Chemotherapy Induced Anemia  -Continue to monitor for signs and symptoms of bleeding -Repeat CBC in the a.m.  Hyperbilirubinemia -Mild. Patient's T bili went from 0.9 -> 1.3 -> 0.9 -Continue to monitor and repeat CMP in a.m.  Mild Hypoglycemia -Patient's Blood Sugar on BMP this AM was 68 and then finger stick was 67; Patient was Asymptomatic  -Given Orange Juice and improved -Continue to Monitor closely   DVT prophylaxis: Anticoagulated with Rivaroxaban 15 mg po BID Code Status: FULL CODE Family Communication: No family present at bedside  Disposition Plan: Anticipate D/C to SNF in the next 24 hours pending Insurance Authorization   Consultants:   Medical Oncology Dr. Burr Medico   Procedures: None  Antimicrobials: Anti-infectives (From admission, onward)   Start     Dose/Rate Route Frequency Ordered Stop   03/31/18 0900  piperacillin-tazobactam (ZOSYN) IVPB 3.375 g     3.375 g 12.5 mL/hr over 240 Minutes Intravenous Every 8 hours 03/31/18 0843       Subjective: Seen and examined and stated he felt much better and was doing well.  States her diarrhea has been slowing down but still has some.  No abdominal tenderness or pain.  No chest pain, shortness breath, nausea, vomiting.  No other complaints  or concerns at this time.  Objective: Vitals:   04/01/18 1453 04/01/18 1956 04/02/18 0527 04/02/18 0945  BP: (!) 96/59 (!) 93/55 (!) 102/54 112/60  Pulse: 78 81 83 88  Resp: 14 15 16    Temp: 99.5 F (37.5 C) 99.2 F (37.3 C) 98.8 F (37.1 C)   TempSrc: Oral Oral Oral   SpO2: 92% 98% 94%   Weight:        Intake/Output Summary (Last 24 hours) at 04/02/2018 1404 Last data filed at 04/02/2018 1230 Gross per 24 hour  Intake 2005.63 ml  Output -  Net 2005.63 ml   Filed Weights   03/29/18 1101  Weight: 63.5 kg (140 lb)    Examination: Physical Exam:  Constitutional: Thin elderly African-American female who appears younger than stated age who is currently in no acute distress and appears calm and comfortable. Eyes: Sclera anicteric.  Lids and conjunctive are normal. ENMT: External ears and nose appear normal.  Grossly normal hearing. Neck: Neck is supple with no JVD Respiratory: Slightly diminished to auscultation bilaterally with no appreciable wheezing, rales, rhonchi.  Patient has unlabored breathing is not using accessory muscles to breathe. Cardiovascular: Regular rate and rhythm.  No appreciable murmurs, rubs, gallops.  No lower extremity edema noted. Abdomen: Soft, nontender, nondistended.  Bowel sounds present all 4 quadrants GU: Deferred Musculoskeletal: No contractures or cyanosis noted.  No joint deformities Skin: Skin is warm and dry no appreciable rashes or lesions on limited skin evaluation Neurologic: Cranial nerves II through XII grossly intact no appreciable focal deficits Psychiatric: Normal mood and affect with intact judgment and insight.  Patient is awake, alert, oriented x3  Data Reviewed: I have personally reviewed following labs and imaging studies  CBC: Recent Labs  Lab 03/29/18 1200 03/30/18 0539 03/31/18 0552 04/01/18 0557 04/02/18 0519  WBC 0.9* 1.7* 2.8* 6.4 12.4*  NEUTROABS 0.4  --  0.8* 2.5 8.1*  HGB 8.8* 8.1* 8.0* 9.8* 9.5*  HCT 27.6* 24.7* 24.5* 29.6* 27.5*  MCV 89.9 90.8 91.4 89.7 90.5  PLT 172 154 164 205 409   Basic Metabolic Panel: Recent Labs  Lab 03/29/18 1200 03/30/18 0539 03/31/18 0552 04/01/18 0557 04/02/18 0519  NA 138 141 140 138 138  K 2.7* 2.9* 4.3 3.9 3.4*  CL 104 109 114* 112* 112*  CO2 26 25 21* 19* 20*  GLUCOSE 141* 106* 100* 90 68  BUN 8 6 <5* <5* 5*  CREATININE 1.06* 0.79 0.76 0.75 0.81  CALCIUM 8.9 8.6* 8.6* 8.4* 8.2*  MG 1.4* 1.9 1.5* 1.7 1.6*  PHOS  --   --  1.3* 3.2 3.1   GFR: Estimated Creatinine Clearance: 53.9 mL/min (by  C-G formula based on SCr of 0.81 mg/dL). Liver Function Tests: Recent Labs  Lab 03/29/18 1200 03/30/18 0539 03/31/18 0552 04/01/18 0557 04/02/18 0519  AST 172* 146* 117* 87* 58*  ALT 82* 70* 58* 48 36  ALKPHOS 92 78 70 76 79  BILITOT 1.1 1.0 0.9 1.3* 0.9  PROT 7.3 6.4* 6.1* 5.9* 4.9*  ALBUMIN 2.9* 2.5* 2.4* 2.2* 1.8*   No results for input(s): LIPASE, AMYLASE in the last 168 hours. No results for input(s): AMMONIA in the last 168 hours. Coagulation Profile: No results for input(s): INR, PROTIME in the last 168 hours. Cardiac Enzymes: No results for input(s): CKTOTAL, CKMB, CKMBINDEX, TROPONINI in the last 168 hours. BNP (last 3 results) No results for input(s): PROBNP in the last 8760 hours. HbA1C: No results for input(s): HGBA1C in the last 72  hours. CBG: Recent Labs  Lab 04/02/18 0733 04/02/18 0805 04/02/18 0836  GLUCAP 67 69 83   Lipid Profile: No results for input(s): CHOL, HDL, LDLCALC, TRIG, CHOLHDL, LDLDIRECT in the last 72 hours. Thyroid Function Tests: No results for input(s): TSH, T4TOTAL, FREET4, T3FREE, THYROIDAB in the last 72 hours. Anemia Panel: No results for input(s): VITAMINB12, FOLATE, FERRITIN, TIBC, IRON, RETICCTPCT in the last 72 hours. Sepsis Labs: Recent Labs  Lab 03/30/18 0847 03/30/18 1123 03/30/18 2018 03/30/18 2154  LATICACIDVEN 1.6 3.3* 1.4 1.5    Recent Results (from the past 240 hour(s))  Blood Culture (routine x 2)     Status: None (Preliminary result)   Collection Time: 03/29/18 12:38 PM  Result Value Ref Range Status   Specimen Description   Final    BLOOD LEFT HAND Performed at [facility], [place] [address] [place], [place] [address]    Special Requests   Final    BOTTLES DRAWN AEROBIC AND ANAEROBIC Blood Culture results may not be optimal due to an inadequate volume of blood received in culture bottles Performed at [place] [address], [place], [place] [address]    Culture    Final    NO GROWTH 3 DAYS Performed at [facility], [place] [address], [place], [place] [address]    Report Status PENDING  Incomplete  Blood Culture (routine x 2)     Status: None (Preliminary result)   Collection Time: 03/29/18  3:40 PM  Result Value Ref Range Status   Specimen Description   Final    BLOOD RIGHT HAND Performed at [place] [address] [place], [place] [address]    Special Requests   Final    BOTTLES DRAWN AEROBIC ONLY Blood Culture results may not be optimal due to an inadequate volume of blood received in culture bottles Performed at [place] [address], [place], [place] [address]    Culture   Final    NO GROWTH 3 DAYS Performed at [facility], [place] [address], [place], [place] [address]    Report Status PENDING  Incomplete  Urine culture     Status: Abnormal   Collection Time: 03/29/18  5:38 PM  Result Value Ref Range Status   Specimen Description   Final    URINE, RANDOM Performed at [place] [address] [place],  [address]    Special Requests   Final    NONE Performed at [facility], [place] [address] [place],  [address]    Culture MULTIPLE SPECIES PRESENT, SUGGEST RECOLLECTION (A)  Final   Report Status 03/30/2018 FINAL  Final  Gastrointestinal Panel by PCR , Stool     Status: None   Collection Time: 03/31/18  8:25 AM  Result Value Ref Range Status   Campylobacter species NOT DETECTED NOT DETECTED Final   Plesimonas shigelloides NOT DETECTED NOT DETECTED Final   Salmonella species NOT DETECTED NOT DETECTED Final   Yersinia enterocolitica NOT DETECTED NOT DETECTED Final   Vibrio species NOT DETECTED NOT DETECTED Final   Vibrio cholerae NOT DETECTED NOT DETECTED Final   Enteroaggregative E coli (EAEC) NOT DETECTED NOT DETECTED Final   Enteropathogenic E coli (EPEC) NOT DETECTED NOT DETECTED Final   Enterotoxigenic E coli  (ETEC) NOT DETECTED NOT DETECTED Final   Shiga like toxin producing E coli (STEC) NOT DETECTED NOT DETECTED Final   Shigella/Enteroinvasive E coli (EIEC) NOT  DETECTED NOT DETECTED Final   Cryptosporidium NOT DETECTED NOT DETECTED Final   Cyclospora cayetanensis NOT DETECTED NOT DETECTED Final   Entamoeba histolytica NOT DETECTED NOT DETECTED Final   Giardia lamblia NOT DETECTED NOT DETECTED Final   Adenovirus F40/41 NOT DETECTED NOT DETECTED Final   Astrovirus NOT DETECTED NOT DETECTED Final   Norovirus GI/GII NOT DETECTED NOT DETECTED Final   Rotavirus A NOT DETECTED NOT DETECTED Final   Sapovirus (I, II, IV, and V) NOT DETECTED NOT DETECTED Final    Comment: Performed at [facility], [place] [place], [place] [address]  Culture, blood (x 2)     Status: None (Preliminary result)   Collection Time: 03/31/18  8:51 AM  Result Value Ref Range Status   Specimen Description   Final    BLOOD LEFT HAND Performed at [place] [address] [place], [place] [address]    Special Requests   Final    BOTTLES DRAWN AEROBIC ONLY Blood Culture adequate volume Performed at [place] [address], [place], [place] [address]    Culture   Final    NO GROWTH 1 DAY Performed at [facility], [place] [address] [place], [place] [address]    Report Status PENDING  Incomplete  Culture, blood (x 2)     Status: None (Preliminary result)   Collection Time: 03/31/18  8:58 AM  Result Value Ref Range Status   Specimen Description   Final    BLOOD RIGHT HAND Performed at [place] [address], [place], [place] [address]    Special Requests   Final    BOTTLES DRAWN AEROBIC ONLY Blood Culture adequate volume Performed at [place] [address], [place], [place] [address]    Culture   Final    NO GROWTH 1 DAY Performed at [facility], [place] [address] [place], [place] [address]     Report Status PENDING  Incomplete  Culture, Urine     Status: Abnormal   Collection Time: 03/31/18  3:19 PM  Result Value Ref Range Status   Specimen Description   Final    URINE, CLEAN CATCH Performed at [facility], [place] [address] [place], [place] [address]    Special Requests   Final    NONE Performed at [facility], [place] [address], [place], [place] [address]    Culture (A)  Final    <10,000 COLONIES/mL INSIGNIFICANT GROWTH Performed at [place] [address], [place],  [address]    Report Status 04/01/2018 FINAL  Final    Radiology Studies: No results found.  Scheduled Meds: . amLODipine  5 mg Oral Daily  . aspirin  81 mg Oral Daily  . atenolol  25 mg Oral Daily  . gabapentin  200 mg Oral BID  . mirtazapine  7.5 mg Oral QHS  . pantoprazole  40 mg Oral Q1200  . potassium chloride  40 mEq Oral BID  . rivaroxaban  20 mg Oral Q supper   Continuous Infusions: . sodium chloride 75 mL/hr at 04/02/18 0059  . piperacillin-tazobactam (ZOSYN)  IV 3.375 g (04/02/18 0908)    LOS: 4 days   Kerney Elbe, DO [facility] Pager [phone]  If 7PM-7AM, please contact night-coverage [email or web address] Password [email or web address] 04/02/2018, 2:04 PM

## 2018-04-02 NOTE — Progress Notes (Signed)
When reviewing AM lab work it was noted that blood glucose was 68. Finger prick was taken and glucose was then 67. Patient given 4 oz. Juice and will be rechecked in 15 minutes. MD paged, no orders received. Will continue to monitor.

## 2018-04-03 DIAGNOSIS — Z6821 Body mass index (BMI) 21.0-21.9, adult: Secondary | ICD-10-CM

## 2018-04-03 DIAGNOSIS — R630 Anorexia: Secondary | ICD-10-CM

## 2018-04-03 LAB — TYPE AND SCREEN
ABO/RH(D): A POS
ANTIBODY SCREEN: POSITIVE
PT AG TYPE: NEGATIVE
Unit division: 0

## 2018-04-03 LAB — CBC WITH DIFFERENTIAL/PLATELET
Band Neutrophils: 5 %
Basophils Absolute: 0 10*3/uL (ref 0.0–0.1)
Basophils Relative: 0 %
EOS ABS: 0 10*3/uL (ref 0.0–0.7)
EOS PCT: 0 %
HCT: 29.4 % — ABNORMAL LOW (ref 36.0–46.0)
HEMOGLOBIN: 9.6 g/dL — AB (ref 12.0–15.0)
Lymphocytes Relative: 15 %
Lymphs Abs: 2.4 10*3/uL (ref 0.7–4.0)
MCH: 30.1 pg (ref 26.0–34.0)
MCHC: 32.7 g/dL (ref 30.0–36.0)
MCV: 92.2 fL (ref 78.0–100.0)
METAMYELOCYTES PCT: 3 %
MYELOCYTES: 8 %
Monocytes Absolute: 2.1 10*3/uL — ABNORMAL HIGH (ref 0.1–1.0)
Monocytes Relative: 13 %
NRBC: 2 /100{WBCs} — AB
Neutro Abs: 11.5 10*3/uL — ABNORMAL HIGH (ref 1.7–7.7)
Neutrophils Relative %: 54 %
PROMYELOCYTES RELATIVE: 2 %
Platelets: 186 10*3/uL (ref 150–400)
RBC: 3.19 MIL/uL — AB (ref 3.87–5.11)
RDW: 19.4 % — ABNORMAL HIGH (ref 11.5–15.5)
WBC: 16 10*3/uL — AB (ref 4.0–10.5)

## 2018-04-03 LAB — COMPREHENSIVE METABOLIC PANEL
ALK PHOS: 106 U/L (ref 38–126)
ALT: 32 U/L (ref 14–54)
AST: 61 U/L — ABNORMAL HIGH (ref 15–41)
Albumin: 1.9 g/dL — ABNORMAL LOW (ref 3.5–5.0)
Anion gap: 6 (ref 5–15)
BUN: 5 mg/dL — ABNORMAL LOW (ref 6–20)
CALCIUM: 8 mg/dL — AB (ref 8.9–10.3)
CO2: 17 mmol/L — AB (ref 22–32)
CREATININE: 0.74 mg/dL (ref 0.44–1.00)
Chloride: 115 mmol/L — ABNORMAL HIGH (ref 101–111)
GFR calc non Af Amer: >60 mL/min (ref >60)
Glucose, Bld: 82 mg/dL (ref 65–99)
Potassium: 3.9 mmol/L (ref 3.5–5.1)
SODIUM: 138 mmol/L (ref 135–145)
Total Bilirubin: 0.7 mg/dL (ref 0.3–1.2)
Total Protein: 4.7 g/dL — ABNORMAL LOW (ref 6.5–8.1)

## 2018-04-03 LAB — CULTURE, BLOOD (ROUTINE X 2)
CULTURE: NO GROWTH
Culture: NO GROWTH

## 2018-04-03 LAB — BPAM RBC
Blood Product Expiration Date: 201907022359
ISSUE DATE / TIME: 201906211220
Unit Type and Rh: 6200

## 2018-04-03 LAB — MAGNESIUM: Magnesium: 1.7 mg/dL (ref 1.7–2.4)

## 2018-04-03 LAB — PHOSPHORUS: PHOSPHORUS: 2.5 mg/dL (ref 2.5–4.6)

## 2018-04-03 MED ORDER — AMOXICILLIN-POT CLAVULANATE 875-125 MG PO TABS
1.0000 | ORAL_TABLET | Freq: Two times a day (BID) | ORAL | Status: DC
  Administered 2018-04-03 – 2018-04-04 (×3): 1 via ORAL
  Filled 2018-04-03 (×3): qty 1

## 2018-04-03 MED ORDER — DEXTROSE 5 % IV SOLN
INTRAVENOUS | Status: DC
  Administered 2018-04-03 (×2): via INTRAVENOUS
  Filled 2018-04-03: qty 150

## 2018-04-03 MED ORDER — SODIUM BICARBONATE 8.4 % IV SOLN
INTRAVENOUS | Status: AC
  Administered 2018-04-03: 11:00:00 via INTRAVENOUS
  Filled 2018-04-03: qty 100

## 2018-04-03 NOTE — Progress Notes (Signed)
Lauren Richard   DOB:1936-06-24   HU#:[id number]   WSF#:[id number]  Oncology f/u note   Subjective: Pt has not had fever over the weekend, she feels much better overall, diarrhea is near resolved, her appetite has improved.  She has walked in the hallway with her physical therapist, is waiting for SNF placement.  She complains of bilateral lower extremity edema, no significant pain.  Objective:  Vitals:   04/03/18 0530 04/03/18 1438  BP: 110/63 107/65  Pulse: 83 83  Resp: 18 17  Temp: 98.7 F (37.1 C) 98.4 F (36.9 C)  SpO2: 99% 96%    Body mass index is 21.93 kg/m.  Intake/Output Summary (Last 24 hours) at 04/03/2018 1625 Last data filed at 04/03/2018 1451 Gross per 24 hour  Intake 1065.42 ml  Output 3 ml  Net 1062.42 ml     Sclerae unicteric  Oropharynx clear  No peripheral adenopathy  Lungs clear -- no rales or rhonchi  Heart regular rate and rhythm  Abdomen benign  MSK no focal spinal tenderness, no peripheral edema  Neuro nonfocal   CBG (last 3)  Recent Labs    04/02/18 0733 04/02/18 0805 04/02/18 0836  GLUCAP 67 69 83     Labs:  Lab Results  Component Value Date   WBC 16.0 (H) 04/03/2018   HGB 9.6 (L) 04/03/2018   HCT 29.4 (L) 04/03/2018   MCV 92.2 04/03/2018   PLT 186 04/03/2018   NEUTROABS 11.5 (H) 04/03/2018    CMP Latest Ref Rng & Units 04/03/2018 04/02/2018 04/01/2018  Glucose 65 - 99 mg/dL 82 68 90  BUN 6 - 20 mg/dL 5(L) 5(L) <5(L)  Creatinine 0.44 - 1.00 mg/dL 0.74 0.81 0.75  Sodium 135 - 145 mmol/L 138 138 138  Potassium 3.5 - 5.1 mmol/L 3.9 3.4(L) 3.9  Chloride 101 - 111 mmol/L 115(H) 112(H) 112(H)  CO2 22 - 32 mmol/L 17(L) 20(L) 19(L)  Calcium 8.9 - 10.3 mg/dL 8.0(L) 8.2(L) 8.4(L)  Total Protein 6.5 - 8.1 g/dL 4.7(L) 4.9(L) 5.9(L)  Total Bilirubin 0.3 - 1.2 mg/dL 0.7 0.9 1.3(H)  Alkaline Phos 38 - 126 U/L 106 79 76  AST 15 - 41 U/L 61(H) 58(H) 87(H)  ALT 14 - 54 U/L 32 36 48     Urine Studies No results for input(s): UHGB, CRYS in  the last 72 hours.  Invalid input(s): UACOL, UAPR, USPG, UPH, UTP, UGL, UKET, UBIL, UNIT, UROB, [place], UEPI, UWBC, Junie Panning [place], [place], [place]  Basic Metabolic Panel: Recent Labs  Lab 03/30/18 0539 03/31/18 0552 04/01/18 0557 04/02/18 0519 04/03/18 0550  NA 141 140 138 138 138  K 2.9* 4.3 3.9 3.4* 3.9  CL 109 114* 112* 112* 115*  CO2 25 21* 19* 20* 17*  GLUCOSE 106* 100* 90 68 82  BUN 6 <5* <5* 5* 5*  CREATININE 0.79 0.76 0.75 0.81 0.74  CALCIUM 8.6* 8.6* 8.4* 8.2* 8.0*  MG 1.9 1.5* 1.7 1.6* 1.7  PHOS  --  1.3* 3.2 3.1 2.5   GFR Estimated Creatinine Clearance: 54.5 mL/min (by C-G formula based on SCr of 0.74 mg/dL). Liver Function Tests: Recent Labs  Lab 03/30/18 0539 03/31/18 0552 04/01/18 0557 04/02/18 0519 04/03/18 0550  AST 146* 117* 87* 58* 61*  ALT 70* 58* 48 36 32  ALKPHOS 78 70 76 79 106  BILITOT 1.0 0.9 1.3* 0.9 0.7  PROT 6.4* 6.1* 5.9* 4.9* 4.7*  ALBUMIN 2.5* 2.4* 2.2* 1.8* 1.9*   No results for input(s): LIPASE, AMYLASE in the  last 168 hours. No results for input(s): AMMONIA in the last 168 hours. Coagulation profile No results for input(s): INR, PROTIME in the last 168 hours.  CBC: Recent Labs  Lab 03/29/18 1200 03/30/18 0539 03/31/18 0552 04/01/18 0557 04/02/18 0519 04/03/18 0550  WBC 0.9* 1.7* 2.8* 6.4 12.4* 16.0*  NEUTROABS 0.4  --  0.8* 2.5 8.1* 11.5*  HGB 8.8* 8.1* 8.0* 9.8* 9.5* 9.6*  HCT 27.6* 24.7* 24.5* 29.6* 27.5* 29.4*  MCV 89.9 90.8 91.4 89.7 90.5 92.2  PLT 172 154 164 205 200 186   Cardiac Enzymes: No results for input(s): CKTOTAL, CKMB, CKMBINDEX, TROPONINI in the last 168 hours. BNP: Invalid input(s): POCBNP CBG: Recent Labs  Lab 04/02/18 0733 04/02/18 0805 04/02/18 0836  GLUCAP 67 69 83   D-Dimer No results for input(s): DDIMER in the last 72 hours. Hgb A1c No results for input(s): HGBA1C in the last 72 hours. Lipid Profile No results for input(s): CHOL, HDL, LDLCALC, TRIG, CHOLHDL, LDLDIRECT in the last 72  hours. Thyroid function studies No results for input(s): TSH, T4TOTAL, T3FREE, THYROIDAB in the last 72 hours.  Invalid input(s): FREET3 Anemia work up No results for input(s): VITAMINB12, FOLATE, FERRITIN, TIBC, IRON, RETICCTPCT in the last 72 hours. Microbiology Recent Results (from the past 240 hour(s))  Blood Culture (routine x 2)     Status: None   Collection Time: 03/29/18 12:38 PM  Result Value Ref Range Status   Specimen Description   Final    BLOOD LEFT HAND Performed at [facility], [place] [address], [place], [place] [address]    Special Requests   Final    BOTTLES DRAWN AEROBIC AND ANAEROBIC Blood Culture results may not be optimal due to an inadequate volume of blood received in culture bottles Performed at [facility] [address] [place], [place] [address]    Culture   Final    NO GROWTH 5 DAYS Performed at [facility], [place] [address] [place], [place] [address]    Report Status 04/03/2018 FINAL  Final  Blood Culture (routine x 2)     Status: None   Collection Time: 03/29/18  3:40 PM  Result Value Ref Range Status   Specimen Description   Final    BLOOD RIGHT HAND Performed at [facility] [address], [place], [place] [address]    Special Requests   Final    BOTTLES DRAWN AEROBIC ONLY Blood Culture results may not be optimal due to an inadequate volume of blood received in culture bottles Performed at [facility] [address] [place], [place] [address]    Culture   Final    NO GROWTH 5 DAYS Performed at [facility], [place] [address], [place], [place] [address]    Report Status 04/03/2018 FINAL  Final  Urine culture     Status: Abnormal   Collection Time: 03/29/18  5:38 PM  Result Value Ref Range Status   Specimen Description   Final    URINE, RANDOM Performed at [facility] [address] [place],  [address]    Special  Requests   Final    NONE Performed at [facility], [place] [address] [place],  [address]    Culture MULTIPLE SPECIES PRESENT, SUGGEST RECOLLECTION (A)  Final   Report Status 03/30/2018 FINAL  Final  Gastrointestinal Panel by PCR , Stool     Status: None   Collection Time: 03/31/18  8:25 AM  Result Value Ref Range Status   Campylobacter species NOT DETECTED NOT DETECTED Final   Plesimonas shigelloides NOT DETECTED NOT DETECTED Final   Salmonella species NOT DETECTED NOT DETECTED Final   Yersinia enterocolitica NOT DETECTED NOT DETECTED Final   Vibrio species NOT DETECTED NOT DETECTED Final   Vibrio cholerae NOT DETECTED NOT DETECTED Final   Enteroaggregative E coli (EAEC) NOT DETECTED NOT DETECTED Final   Enteropathogenic E coli (EPEC) NOT DETECTED NOT DETECTED Final   Enterotoxigenic E coli (ETEC) NOT DETECTED NOT DETECTED Final   Shiga like toxin producing E coli (STEC) NOT DETECTED NOT DETECTED Final   Shigella/Enteroinvasive E coli (EIEC) NOT DETECTED NOT DETECTED Final   Cryptosporidium NOT DETECTED NOT DETECTED Final   Cyclospora cayetanensis NOT DETECTED NOT DETECTED Final   Entamoeba histolytica NOT DETECTED NOT DETECTED Final   Giardia lamblia NOT DETECTED NOT DETECTED Final   Adenovirus F40/41 NOT DETECTED NOT DETECTED Final   Astrovirus NOT DETECTED NOT DETECTED Final   Norovirus GI/GII NOT DETECTED NOT DETECTED Final   Rotavirus A NOT DETECTED NOT DETECTED Final   Sapovirus (I, II, IV, and V) NOT DETECTED NOT DETECTED Final    Comment: Performed at [facility], [place] [place], [place] [address]  Culture, blood (x 2)     Status: None (Preliminary result)   Collection Time: 03/31/18  8:51 AM  Result Value Ref Range Status   Specimen Description   Final    BLOOD LEFT HAND Performed at [facility], [place] [address] [place], [place] [address]    Special Requests   Final    BOTTLES DRAWN AEROBIC ONLY Blood  Culture adequate volume Performed at [place] [address] [place], [place] [address]    Culture   Final    NO GROWTH 3 DAYS Performed at [facility], [place] [address], [place], [place] [address]    Report Status PENDING  Incomplete  Culture, blood (x 2)     Status: None (Preliminary result)   Collection Time: 03/31/18  8:58 AM  Result Value Ref Range Status   Specimen Description   Final    BLOOD RIGHT HAND Performed at [facility] [address], [place], [place] [address]    Special Requests   Final    BOTTLES DRAWN AEROBIC ONLY Blood Culture adequate volume Performed at [facility] [place] [address] [place], [place] [address]    Culture   Final    NO GROWTH 3 DAYS Performed at [facility], [place] [address] [place], [place] [address]    Report Status PENDING  Incomplete  Culture, Urine     Status: Abnormal   Collection Time: 03/31/18  3:19 PM  Result Value Ref Range Status   Specimen Description   Final    URINE, CLEAN CATCH Performed at [facility], [place] [address] [place], [place] [address]    Special Requests   Final    NONE Performed at [facility], [place] [address] [place], [place] [address]    Culture (A)  Final    <10,000 COLONIES/mL INSIGNIFICANT GROWTH Performed at [facility] [address] [place], [place] [address]    Report Status 04/01/2018 FINAL  Final      Studies:  Dg Chest Port 1 View  Result Date: 04/02/2018 CLINICAL DATA:  Neutropenic fever EXAM: PORTABLE CHEST 1 VIEW COMPARISON:  01/09/2018 CXR,  chest CT 03/09/2018 FINDINGS: The heart size and mediastinal contours are within normal limits. Minimal aortic atherosclerosis with slight uncoiling of the thoracic aorta. No pulmonary consolidation. Minimal bibasilar atelectasis. No significant effusion or pneumothorax. The visualized skeletal structures are unremarkable.  IMPRESSION: Bibasilar atelectasis. Aortic atherosclerosis. No acute pulmonary consolidation or CHF. Electronically Signed   By: Ashley Royalty M.D.   On: 04/02/2018 19:21    Assessment: 82 y.o. African-American female with recently diagnosed metastatic pancreatic cancer, PE, presented with profound fatigue, low appetite and diarrhea after chemo.  1.  Generalized weakness, secondary to chemotherapy, improved 2.  Anorexia, nausea, secondary to chemotherapy and underlying malignancy, improved 3.  Neutropenia and anemia, secondary to chemotherapy 4.  Metastatic pancreatic cancer, status post chemotherapy gemcitabine and Abraxane  5. History of PE  6. HTN 7.  Transaminitis 8. Diarrhea secondary to chemo, improved, GI panel (-) 9. Neutropenic fever resolved, cultures (-)  Plan:  -Her neutropenia resolved after 1 dose of Granix, she has mild leukocytosis, secondary to Granix.   -Her ID work-up has been negative.  Agrees with changing Zosyn to oral Augmentin for total of 5 days. -She is overall recovering, will likely go to rehab -I plan to see her back in 1 week, and will discuss chemo again.  Truitt Merle, MD 04/03/2018  4:25 PM

## 2018-04-03 NOTE — Care Management Important Message (Signed)
Important Message  Patient Details  Name: ETERNITY DEXTER MRN: [id number] Date of Birth: 06/12/1936   Medicare Important Message Given:  Yes    Kerin Salen 04/03/2018, 10:50 AMImportant Message  Patient Details  Name: MAHOGANI HOLOHAN MRN: [id number] Date of Birth: [birth year]   Medicare Important Message Given:  Yes    Kerin Salen 04/03/2018, 10:50 AM

## 2018-04-03 NOTE — Progress Notes (Signed)
Occupational Therapy Treatment Patient Details Name: Lauren Richard MRN: [id number] DOB: 05-Jul-1936 Today's Date: 04/03/2018    History of present illness Lauren Richard is a 82 y.o. female with medical history significant of metastatic pancreatic cancer, HTN, GERD, recent dx PE  who presents with generalized weakness, diarrhea, weakness.   OT comments  Pt ambulated to bathroom, stood at sink for washing hands and donned socks. She needs min guard to min A and cues for safety when standing and ambulating.  Pt did not feel up to completing ADL this pm. Recommends SNF prior to home alone for safety.  Follow Up Recommendations  SNF   Equipment Recommendations  3 in 1 bedside commode    Recommendations for Other Services      Precautions / Restrictions Precautions Precautions: Fall       Mobility Bed Mobility         Supine to sit: Min assist     General bed mobility comments: assist for trunk with getting to EOB  Transfers   Equipment used: Rolling walker (2 wheeled)   Sit to Stand: Min guard;Min assist         General transfer comment: and cues for safety/lines.  Pt initially got up without RW, and she was unsteady.  Min guard from commode with RW for safety. Cues to feel legs behind back of chair prior to sitting.    Balance                                           ADL either performed or assessed with clinical judgement   ADL       Grooming: Wash/dry hands;Min guard;Standing               Lower Body Dressing: Minimal assistance;Sit to/from stand;Min guard(able to don socks)   Toilet Transfer: Minimal assistance;Min guard;Ambulation;Comfort height toilet;RW   Toileting- Clothing Manipulation and Hygiene: Set up;Sitting/lateral lean               Vision       Perception     Praxis      Cognition Arousal/Alertness: Awake/alert Behavior During Therapy: WFL for tasks assessed/performed                                   General Comments: mostly WFLs; cues for safety with RW        Exercises     Shoulder Instructions       General Comments      Pertinent Vitals/ Pain       Pain Assessment: No/denies pain  Home Living                                          Prior Functioning/Environment              Frequency  Min 2X/week        Progress Toward Goals  OT Goals(current goals can now be found in the care plan section)  Progress towards OT goals: Progressing toward goals     Plan      Co-evaluation                 AM-PAC PT "6 Clicks"  Daily Activity     Outcome Measure   Help from another person eating meals?: None Help from another person taking care of personal grooming?: A Little Help from another person toileting, which includes using toliet, bedpan, or urinal?: A Little Help from another person bathing (including washing, rinsing, drying)?: A Little Help from another person to put on and taking off regular upper body clothing?: A Little Help from another person to put on and taking off regular lower body clothing?: A Little 6 Click Score: 19    End of Session    OT Visit Diagnosis: Muscle weakness (generalized) (M62.81)   Activity Tolerance Patient tolerated treatment well   Patient Left in chair;with call bell/phone within reach   Nurse Communication          Time: 9242-6834 OT Time Calculation (min): 16 min  Charges: OT General Charges $OT Visit: 1 Visit OT Treatments $Self Care/Home Management : 8-22 mins  Lesle Chris, OTR/L [phone] 04/03/2018   Davion Meara 04/03/2018, 1:52 PM

## 2018-04-03 NOTE — Progress Notes (Signed)
Physical Therapy Treatment Patient Details Name: Lauren Richard MRN: [id number] DOB: 12/11/1935 Today's Date: 04/03/2018    History of Present Illness Lauren Richard is a 82 y.o. female with medical history significant of metastatic pancreatic cancer, HTN, GERD, recent dx PE  who presents with generalized weakness, diarrhea, weakness.    PT Comments    Pt progressing well; per pt she is not able to go to SNF; she states her sister can come stay with her until her niece gets back from out of town; she is improving, amb 59'  With RW but would benefit form incr support at home;  Follow Up Recommendations  Home health PT(ins denied SNF)     Equipment Recommendations  3in1 (PT)    Recommendations for Other Services       Precautions / Restrictions Precautions Precautions: Fall    Mobility  Bed Mobility   Bed Mobility: Sit to Supine     Supine to sit: Min assist Sit to supine: Min assist   General bed mobility comments: assist with LEs onto bed  Transfers Overall transfer level: Needs assistance Equipment used: Rolling walker (2 wheeled) Transfers: Sit to/from Stand Sit to Stand: Supervision         General transfer comment: cues for safety and lines, pt begins to walk without regard for IV  Ambulation/Gait Ambulation/Gait assistance: Min guard Gait Distance (Feet): 280 Feet Assistive device: Rolling walker (2 wheeled) Gait Pattern/deviations: Step-through pattern;Trunk flexed;Decreased stride length     General Gait Details: verbal cues for incr trunk extension, walking in walker frame; mildly dyspneic, HR 118 max   Stairs             Wheelchair Mobility    Modified Rankin (Stroke Patients Only)       Balance   Sitting-balance support: No upper extremity supported;Feet unsupported Sitting balance-Leahy Scale: Good     Standing balance support: Single extremity supported;No upper extremity supported Standing balance-Leahy Scale: Fair                               Cognition Arousal/Alertness: Awake/alert Behavior During Therapy: WFL for tasks assessed/performed Overall Cognitive Status: Within Functional Limits for tasks assessed                                 General Comments: mostly WFLs; cues for safety with RW      Exercises      General Comments        Pertinent Vitals/Pain Pain Assessment: No/denies pain    Home Living                      Prior Function            PT Goals (current goals can now be found in the care plan section) Acute Rehab PT Goals PT Goal Formulation: With patient Time For Goal Achievement: 04/13/18 Potential to Achieve Goals: Good Progress towards PT goals: Progressing toward goals    Frequency    Min 3X/week      PT Plan Current plan remains appropriate    Co-evaluation              AM-PAC PT "6 Clicks" Daily Activity  Outcome Measure  Difficulty turning over in bed (including adjusting bedclothes, sheets and blankets)?: A Little Difficulty moving from lying on back to sitting on  the side of the bed? : A Lot Difficulty sitting down on and standing up from a chair with arms (e.g., wheelchair, bedside commode, etc,.)?: A Little Help needed moving to and from a bed to chair (including a wheelchair)?: A Little Help needed walking in hospital room?: A Little Help needed climbing 3-5 steps with a railing? : A Lot 6 Click Score: 16    End of Session Equipment Utilized During Treatment: Gait belt Activity Tolerance: Patient tolerated treatment well Patient left: in bed;with call bell/phone within reach;with bed alarm set Nurse Communication: Mobility status PT Visit Diagnosis: Unsteadiness on feet (R26.81)     Time: 1436-1500 PT Time Calculation (min) (ACUTE ONLY): 24 min  Charges:  $Gait Training: 23-37 mins                    G CodesKenyon Ana, PT Pager:  [phone] 04/03/2018    Kenyon Ana 04/03/2018, 3:09 PM

## 2018-04-03 NOTE — Progress Notes (Signed)
CSW contacted by [facility] staff member ([place]) and informed that patient's insurance authorization was denied. Staff reported that patient's attending MD has the option to do a peer to peer review.   CSW updated patient's attending MD.   CSW will continue to follow and assist with discharge planning as appropriate.  Abundio Miu, [place] Social Worker [facility] Cell#: [phone]

## 2018-04-03 NOTE — Progress Notes (Signed)
PROGRESS NOTE    Lauren Richard  OHY:[id number] DOB: [birth year] DOA: 03/29/2018 PCP: Glendale Chard, MD   Brief Narrative:  Lauren Richard is a 82 y.o. female with medical history significant of metastatic pancreatic cancer, HTN, GERD, recent dx PE on xarelto who presents with generalized weakness.  She states that her chemotherapy regimen was increased about a week ago.  Since then, she has noticed generalized weakness, nausea, vomiting 2-3 episodes this morning, diarrhea since Sunday 2-3 episodes.  States that she feels weak overall, even taking a shower this morning took much effort.  She has no other complaints of fevers, new or worsening cough, shortness of breath, chest pain, abdominal pain, dysuria. Admitted for Generalized Weakness, Nausea, Vomiting, and Diarrhea and is slowly improving then subsequently developed neutropenic fever and spiked a temperature of 101.3 overnight. Oncology was consulted and Recommended Granix and stopping once ANC is greater than 1500 and also recommended transfusing 1 unit of PRBCs. Started on Empiric antibiotics at this time with IV Zosyn switched to po Augmentin for total of 5 day course.  Assessment & Plan:   Principal Problem:   Generalized weakness Active Problems:   Hyperlipidemia   Pancreatic mass   Hypokalemia   Adenocarcinoma of head of pancreas (HCC)   Chemotherapy induced neutropenia (HCC)   Nausea & vomiting   Diarrhea  Neutropenic Fever -Patient spiked a TMax of 101.3; ? Mucositis  -WBC improved and is now 16.0 (improved after Granix Administration)  -Blood cultures obtained in ED and showed no growth to date at 5 days -Repeat Blood Cx x2 Showed NGTD at 3 Days   -Initial Urinalysis showed hazy appearance with amber color, small hemoglobin, small leukocytes, rare bacteria, 11-20 squamous epithelial cells and 6-10 urine WBCs -Urine culture showed multiple species present as suggested recollection; however patient is not complaining of  any urinary complaints or symptoms but will repeat urinalysis and urine culture given her neutropenic fever (Repeated prior to Abx administration)  -Repeat Urinalysis unremarkable and repeat Urine Cx showed <10,000 CFU of Insignificant Growth  -Continue to Monitor Daily CBC; WBC increasing from 0.9 -> 1.7 -> 2.8 -> 6.4 -> 12.4 -> 16.0 -Checked GI pathogen panel given her recent diarrhea and is Negative -CXR obtained and personally reviewed and showed no acute cardiopulmonary disease on my interpretation but official read by Radiologist still pending  -Empirically start IV Zosyn for coverage for neutropenic fever and changed to po Augmentin to complete 5 day course at the recommendation of Oncology Dr. Irene Limbo  -Continue to monitor cultures and repeat CBC with differential in the a.m. -Patient recieved Tbo-Filgrastim 480 mcg subcu once during hospitalization  -Continue to Monitor and anticipate D/C in AM   Generalized Weakness -Supportive care as below -PT/ OT evaluated and Treated and recommending SNF vs. HHPT if improves -Social Work [employer] in anticipation for SNF discharge and working on placement for patient and may D/C in next 24-48 hours if medically stable  -Insurance Denied SNF authorization and PT re-evaluated later this afternoon -Patient to go Home with Home Health PT in AM with 3in1  Nausea, Vomiting, Diarrhea, improving -Likely secondary to chemotherapy; Nausea and vomiting improved and states that diarrhea is improving  -C/w Supportive Care and Antiemetics prn with Zofran 4 mg po/IV q6hprn -C/w IVF NS but reduced rate from 100 mL/hr to 75 mL/hr and now changed to Sodium Bicarbonate because of Non-Gap Metabolic Acidosis  -Diet Advanced to Regular Diet  -Checked GI Pathogen Panel for Diarrhea and Negative; Likley  Diarrhea was Chemotherapy induced though  -Patient was started on mirtazapine 7.5 mg nightly for appetite stimulant by her primary oncologist  Lactic Acidosis -Likely  from Above and Significantly improved -Give NS 1 Liter bolus today and will continue IVF Maintenance at 100 mL/hr -LA went from 2.84 and then trended down to 1.6 and is now back up to 3.3; LA now trended down and is 1.5 -Will not recheck   Hypokalemia -Patient's K+ was 3.9 this AM -Continue to monitor and replete as necessary -Repeat CMP in a.m.  Hypomagnesemia -Magnesium level is now 1.6 -Replete with IV mag sulfate 2 g  -Continue to monitor and replete as necessary -Repeat Magnesium level in the a.m.  Hypophosphatemia -Patient's phosphorus level was 1.3 and improved to 2.5 -Continue to monitor and replete as necessary -Repeat phosphorus level in the a.m.  Metastatic Pancreatic Cancer (Adenocarcinoma of the Head of the Pancreas)  -Follows with Dr. Burr Medico and recently received Gemcitabine and Abraxane last week -CA 19-9 was 126 earlier this Month -Notified Dr. Burr Medico of Patient's Admission initiated evaluation recommendations. -Dr. Morey Hummingbird started the patient on mirtazapine 7.5 mg p.o. nightly to see if this helps her appetite -Dr. Morey Hummingbird recommending follow-up 1 week after discharge to reevaluate her candidacy for chemotherapy in outpatient setting.  We will not have a port placed while she is here -Per Dr. Burr Medico she plans to see the patient back in 1 week to discuss chemotherapy again. -C/w Pain control with Tramadol 50 mg po q12hprn Moderate Pain  Recently  PE -On Rivaroxaban 15 mg po BID x 2 more days to complete initial 21 days and then will be placed on Xarelto 20 mg po Daily with supper   Transaminitis/Abnormal LFT's, improving  -Possibly related to chemotherapy, as noted by Dr. Ernestina Penna note -Stable from previous labs and slightly Trending down -AST is now down from 217 and is 61 -ALT is now down from 110 and is now 32 -Continue to Monitor and Trend LFT's; Repeat CMP in AM   HTN -C/w Amlodipine 5 mg po Daily and with Atenolol 25 mg po Daily   GERD -C/w Pantoprazole 40 mg  po Daily   Normocytic Anemia -Patient's hemoglobin/hematocrit dropped to 8.0/24.5 -After discussion with patient's primary oncologist Dr. Morey Hummingbird she recommends transfusion of 1 unit PRBCs -Type and screen ordered 1 unit to be transfused and after Transfusion her Hb/Hct improved to 9.8/29.6 and is now 9.6/29.4 -Likely Chemotherapy Induced Anemia  -Continue to monitor for signs and symptoms of bleeding -Repeat CBC in the a.m.  Hyperbilirubinemia -Mild. Patient's T bili went from 0.9 -> 1.3 -> 0.9 -> 0.7 -Continue to monitor and repeat CMP in a.m.  Mild Hypoglycemia -Patient's Blood Sugar on BMP this AM was 68 and then finger stick was 67; Patient was Asymptomatic  -Given Orange Juice and improved -Continue to Monitor closely; CBG's ranging from 76-72  Non-Gap Metabolic Acidosis -Patient's AG was 6 today and CO2 was 17 -Started Sodium Bicarbonate 150 mEQ at a rate of 75 mL/hr x6 hours but will continue overnight -Repeat CMP in AM   DVT prophylaxis: Anticoagulated with Rivaroxaban 15 mg po BID Code Status: FULL CODE Family Communication: No family present at bedside  Disposition Plan: Anticipated D/C to SNF but Insurance denied Authorization so will go home in AM if medically stable to D/C.   Consultants:   Medical Oncology Dr. Burr Medico   Procedures: None  Antimicrobials: Anti-infectives (From admission, onward)   Start     Dose/Rate Route Frequency Ordered  Stop   04/03/18 1030  amoxicillin-clavulanate (AUGMENTIN) 875-125 MG per tablet 1 tablet     1 tablet Oral Every 12 hours 04/03/18 1026 04/06/18 0959   03/31/18 0900  piperacillin-tazobactam (ZOSYN) IVPB 3.375 g  Status:  Discontinued     3.375 g 12.5 mL/hr over 240 Minutes Intravenous Every 8 hours 03/31/18 0843 04/03/18 1026     Subjective: Seen and examined and stated she was doing well but diarrhea was still lingering. Was surprised to learn about insurance denial of SNF. No CP or SOB and states she will work with therapy  again this afternoon. Feels better than she did since presenting to the hospital.   Objective: Vitals:   04/02/18 1456 04/02/18 2006 04/03/18 0530 04/03/18 1438  BP: 107/61 94/63 110/63 107/65  Pulse: 85 77 83 83  Resp: 18  18 17   Temp: 98.4 F (36.9 C) 98.3 F (36.8 C) 98.7 F (37.1 C) 98.4 F (36.9 C)  TempSrc: Oral Oral Oral Oral  SpO2:  98% 99% 96%  Weight:        Intake/Output Summary (Last 24 hours) at 04/03/2018 1921 Last data filed at 04/03/2018 1845 Gross per 24 hour  Intake 656.25 ml  Output 2 ml  Net 654.25 ml   Filed Weights   03/29/18 1101  Weight: 63.5 kg (140 lb)   Examination: Physical Exam:  Constitutional: Thin elderly African-American female who appears younger than stated age who is currently in no acute distress and states she is feeling better. Eyes: Sclera anicteric.  Lids and conjunctive are normal ENMT: External ears and nose appear normal.  Grossly normal hearing Neck: Neck is supple no JVD Respiratory: Diminished to auscultation slightly bilaterally with no appreciable wheezing, rales, rhonchi.  Patient has unlabored breathing and is not using accessory muscle breathe Cardiovascular: Regular rate and rhythm.  No appreciable murmurs, rubs, gallops.  No lower extremity edema noted  Abdomen: Soft, nontender, nondistended.  Bowel sounds present all 4 quadrants GU: Deferred Musculoskeletal: No contractures or cyanosis noted.  No joint deformities Skin: Skin is warm and dry with no appreciable rashes or lesions limited skin evaluation Neurologic: Cranial nerves II through XII grossly intact no appreciable focal deficits Psychiatric: Normal pleasant mood and affect.  Intact judgment insight.    Data Reviewed: I have personally reviewed following labs and imaging studies  CBC: Recent Labs  Lab 03/29/18 1200 03/30/18 0539 03/31/18 0552 04/01/18 0557 04/02/18 0519 04/03/18 0550  WBC 0.9* 1.7* 2.8* 6.4 12.4* 16.0*  NEUTROABS 0.4  --  0.8* 2.5  8.1* 11.5*  HGB 8.8* 8.1* 8.0* 9.8* 9.5* 9.6*  HCT 27.6* 24.7* 24.5* 29.6* 27.5* 29.4*  MCV 89.9 90.8 91.4 89.7 90.5 92.2  PLT 172 154 164 205 200 409   Basic Metabolic Panel: Recent Labs  Lab 03/30/18 0539 03/31/18 0552 04/01/18 0557 04/02/18 0519 04/03/18 0550  NA 141 140 138 138 138  K 2.9* 4.3 3.9 3.4* 3.9  CL 109 114* 112* 112* 115*  CO2 25 21* 19* 20* 17*  GLUCOSE 106* 100* 90 68 82  BUN 6 <5* <5* 5* 5*  CREATININE 0.79 0.76 0.75 0.81 0.74  CALCIUM 8.6* 8.6* 8.4* 8.2* 8.0*  MG 1.9 1.5* 1.7 1.6* 1.7  PHOS  --  1.3* 3.2 3.1 2.5   GFR: Estimated Creatinine Clearance: 54.5 mL/min (by C-G formula based on SCr of 0.74 mg/dL). Liver Function Tests: Recent Labs  Lab 03/30/18 0539 03/31/18 0552 04/01/18 0557 04/02/18 0519 04/03/18 0550  AST 146* 117* 87*  58* 61*  ALT 70* 58* 48 36 32  ALKPHOS 78 70 76 79 106  BILITOT 1.0 0.9 1.3* 0.9 0.7  PROT 6.4* 6.1* 5.9* 4.9* 4.7*  ALBUMIN 2.5* 2.4* 2.2* 1.8* 1.9*   No results for input(s): LIPASE, AMYLASE in the last 168 hours. No results for input(s): AMMONIA in the last 168 hours. Coagulation Profile: No results for input(s): INR, PROTIME in the last 168 hours. Cardiac Enzymes: No results for input(s): CKTOTAL, CKMB, CKMBINDEX, TROPONINI in the last 168 hours. BNP (last 3 results) No results for input(s): PROBNP in the last 8760 hours. HbA1C: No results for input(s): HGBA1C in the last 72 hours. CBG: Recent Labs  Lab 04/02/18 0733 04/02/18 0805 04/02/18 0836  GLUCAP 67 69 83   Lipid Profile: No results for input(s): CHOL, HDL, LDLCALC, TRIG, CHOLHDL, LDLDIRECT in the last 72 hours. Thyroid Function Tests: No results for input(s): TSH, T4TOTAL, FREET4, T3FREE, THYROIDAB in the last 72 hours. Anemia Panel: No results for input(s): VITAMINB12, FOLATE, FERRITIN, TIBC, IRON, RETICCTPCT in the last 72 hours. Sepsis Labs: Recent Labs  Lab 03/30/18 0847 03/30/18 1123 03/30/18 2018 03/30/18 2154  LATICACIDVEN 1.6  3.3* 1.4 1.5    Recent Results (from the past 240 hour(s))  Blood Culture (routine x 2)     Status: None   Collection Time: 03/29/18 12:38 PM  Result Value Ref Range Status   Specimen Description   Final    BLOOD LEFT HAND Performed at [facility], [place] [address] [place], [place] [address]    Special Requests   Final    BOTTLES DRAWN AEROBIC AND ANAEROBIC Blood Culture results may not be optimal due to an inadequate volume of blood received in culture bottles Performed at [place] [address], [place], [place] [address]    Culture   Final    NO GROWTH 5 DAYS Performed at [facility], [place] [address] [place], [place] [address]    Report Status 04/03/2018 FINAL  Final  Blood Culture (routine x 2)     Status: None   Collection Time: 03/29/18  3:40 PM  Result Value Ref Range Status   Specimen Description   Final    BLOOD RIGHT HAND Performed at [place] [address] [place], [place] [address]    Special Requests   Final    BOTTLES DRAWN AEROBIC ONLY Blood Culture results may not be optimal due to an inadequate volume of blood received in culture bottles Performed at [place] [address], [place], [place] [address]    Culture   Final    NO GROWTH 5 DAYS Performed at [facility], [place] [address] [place], [place] [address]    Report Status 04/03/2018 FINAL  Final  Urine culture     Status: Abnormal   Collection Time: 03/29/18  5:38 PM  Result Value Ref Range Status   Specimen Description   Final    URINE, RANDOM Performed at [place] [address], [place], [place] [address]    Special Requests   Final    NONE Performed at [facility], [place] [address] [place], [place] [address]    Culture MULTIPLE SPECIES PRESENT, SUGGEST RECOLLECTION (A)  Final   Report Status 03/30/2018 FINAL  Final  Gastrointestinal Panel by PCR ,  Stool     Status: None   Collection Time: 03/31/18  8:25 AM  Result Value Ref  Range Status   Campylobacter species NOT DETECTED NOT DETECTED Final   Plesimonas shigelloides NOT DETECTED NOT DETECTED Final   Salmonella species NOT DETECTED NOT DETECTED Final   Yersinia enterocolitica NOT DETECTED NOT DETECTED Final   Vibrio species NOT DETECTED NOT DETECTED Final   Vibrio cholerae NOT DETECTED NOT DETECTED Final   Enteroaggregative E coli (EAEC) NOT DETECTED NOT DETECTED Final   Enteropathogenic E coli (EPEC) NOT DETECTED NOT DETECTED Final   Enterotoxigenic E coli (ETEC) NOT DETECTED NOT DETECTED Final   Shiga like toxin producing E coli (STEC) NOT DETECTED NOT DETECTED Final   Shigella/Enteroinvasive E coli (EIEC) NOT DETECTED NOT DETECTED Final   Cryptosporidium NOT DETECTED NOT DETECTED Final   Cyclospora cayetanensis NOT DETECTED NOT DETECTED Final   Entamoeba histolytica NOT DETECTED NOT DETECTED Final   Giardia lamblia NOT DETECTED NOT DETECTED Final   Adenovirus F40/41 NOT DETECTED NOT DETECTED Final   Astrovirus NOT DETECTED NOT DETECTED Final   Norovirus GI/GII NOT DETECTED NOT DETECTED Final   Rotavirus A NOT DETECTED NOT DETECTED Final   Sapovirus (I, II, IV, and V) NOT DETECTED NOT DETECTED Final    Comment: Performed at [facility], [place] [place], [place] [address]  Culture, blood (x 2)     Status: None (Preliminary result)   Collection Time: 03/31/18  8:51 AM  Result Value Ref Range Status   Specimen Description   Final    BLOOD LEFT HAND Performed at [facility], [place] [address], [place], [place] [address]    Special Requests   Final    BOTTLES DRAWN AEROBIC ONLY Blood Culture adequate volume Performed at [place] [address], [place], [place] [address]    Culture   Final    NO GROWTH 3 DAYS Performed at [facility], [place] [address], [place], [place] [address]    Report Status PENDING   Incomplete  Culture, blood (x 2)     Status: None (Preliminary result)   Collection Time: 03/31/18  8:58 AM  Result Value Ref Range Status   Specimen Description   Final    BLOOD RIGHT HAND Performed at [place] [address], [place], [place] [address]    Special Requests   Final    BOTTLES DRAWN AEROBIC ONLY Blood Culture adequate volume Performed at [place] [address], [place], [place] [address]    Culture   Final    NO GROWTH 3 DAYS Performed at [facility], [place] [address], [place], [place] [address]    Report Status PENDING  Incomplete  Culture, Urine     Status: Abnormal   Collection Time: 03/31/18  3:19 PM  Result Value Ref Range Status   Specimen Description   Final    URINE, CLEAN CATCH Performed at [facility], [place] [address], [place], [place] [address]    Special Requests   Final    NONE Performed at [facility], [place] [address] [place], [place] [address]    Culture (A)  Final    <10,000 COLONIES/mL INSIGNIFICANT GROWTH Performed at [place] [address], [place], [place] [address]    Report Status 04/01/2018 FINAL  Final    Radiology Studies: Dg Chest Port 1 View  Result Date: 04/02/2018 CLINICAL DATA:  Neutropenic fever EXAM: PORTABLE CHEST 1 VIEW COMPARISON:  01/09/2018 CXR, chest CT 03/09/2018 FINDINGS: The heart size and  mediastinal contours are within normal limits. Minimal aortic atherosclerosis with slight uncoiling of the thoracic aorta. No pulmonary consolidation. Minimal bibasilar atelectasis. No significant effusion or pneumothorax. The visualized skeletal structures are unremarkable. IMPRESSION: Bibasilar atelectasis. Aortic atherosclerosis. No acute pulmonary consolidation or CHF. Electronically Signed   By: Ashley Royalty M.D.   On: 04/02/2018 19:21    Scheduled Meds: . amLODipine  5 mg Oral Daily  . amoxicillin-clavulanate  1 tablet Oral  Q12H  . aspirin  81 mg Oral Daily  . atenolol  25 mg Oral Daily  . gabapentin  200 mg Oral BID  . mirtazapine  7.5 mg Oral QHS  . pantoprazole  40 mg Oral Q1200  . rivaroxaban  20 mg Oral Q supper   Continuous Infusions:   LOS: 5 days   Kerney Elbe, DO [facility] Pager [phone]  If 7PM-7AM, please contact night-coverage [email or web address] Password [facility] 04/03/2018, 7:21 PM

## 2018-04-04 ENCOUNTER — Encounter: Payer: Self-pay | Admitting: *Deleted

## 2018-04-04 ENCOUNTER — Other Ambulatory Visit: Payer: Self-pay | Admitting: Pharmacist

## 2018-04-04 LAB — CBC WITH DIFFERENTIAL/PLATELET
BASOS ABS: 0.3 10*3/uL — AB (ref 0.0–0.1)
BASOS PCT: 2 %
Eosinophils Absolute: 0.1 10*3/uL (ref 0.0–0.7)
Eosinophils Relative: 0 %
HCT: 31.3 % — ABNORMAL LOW (ref 36.0–46.0)
Hemoglobin: 10 g/dL — ABNORMAL LOW (ref 12.0–15.0)
Lymphocytes Relative: 16 %
Lymphs Abs: 2.5 10*3/uL (ref 0.7–4.0)
MCH: 29.6 pg (ref 26.0–34.0)
MCHC: 31.9 g/dL (ref 30.0–36.0)
MCV: 92.6 fL (ref 78.0–100.0)
MONO ABS: 3.1 10*3/uL (ref 0.1–1.0)
Monocytes Relative: 20 %
NEUTROS ABS: 9.6 10*3/uL (ref 1.7–7.7)
NEUTROS PCT: 62 %
PLATELETS: 187 10*3/uL (ref 150–400)
RBC: 3.38 MIL/uL — AB (ref 3.87–5.11)
RDW: 20.1 % — ABNORMAL HIGH (ref 11.5–15.5)
WBC: 15.5 10*3/uL — AB (ref 4.0–10.5)

## 2018-04-04 LAB — COMPREHENSIVE METABOLIC PANEL
ALBUMIN: 1.8 g/dL — AB (ref 3.5–5.0)
ALT: 30 U/L (ref 0–44)
AST: 64 U/L — AB (ref 15–41)
Alkaline Phosphatase: 119 U/L (ref 38–126)
Anion gap: 6 (ref 5–15)
BUN: 5 mg/dL — AB (ref 8–23)
CHLORIDE: 108 mmol/L (ref 98–111)
CO2: 23 mmol/L (ref 22–32)
CREATININE: 0.69 mg/dL (ref 0.44–1.00)
Calcium: 8 mg/dL — ABNORMAL LOW (ref 8.9–10.3)
GFR calc Af Amer: >60 mL/min (ref >60)
GLUCOSE: 95 mg/dL (ref 70–99)
Potassium: 3.6 mmol/L (ref 3.5–5.1)
Sodium: 137 mmol/L (ref 135–145)
Total Bilirubin: 0.6 mg/dL (ref 0.3–1.2)
Total Protein: 4.8 g/dL — ABNORMAL LOW (ref 6.5–8.1)

## 2018-04-04 LAB — MAGNESIUM: MAGNESIUM: 1.5 mg/dL — AB (ref 1.7–2.4)

## 2018-04-04 LAB — PHOSPHORUS: Phosphorus: 2.6 mg/dL (ref 2.5–4.6)

## 2018-04-04 MED ORDER — AMOXICILLIN-POT CLAVULANATE 875-125 MG PO TABS: 1.0000 | ORAL_TABLET | Freq: Two times a day (BID) | ORAL | 0 refills | Status: AC

## 2018-04-04 MED ORDER — RIVAROXABAN 20 MG PO TABS: 20.0000 mg | ORAL_TABLET | Freq: Every day | ORAL | 0 refills | Status: DC

## 2018-04-04 MED ORDER — MAGNESIUM SULFATE 2 GM/50ML IV SOLN
2.0000 g | Freq: Once | INTRAVENOUS | Status: AC
  Administered 2018-04-04: 2 g via INTRAVENOUS
  Filled 2018-04-04: qty 50

## 2018-04-04 NOTE — Care Management Note (Signed)
Case Management Note  Patient Details  Name: Lauren Richard MRN: [id number] Date of Birth: 1936/06/30  Subjective/Objective:                  Discharged to home with 3 in 1 and pt  Action/Plan: [facility] will do dme and pt  Expected Discharge Date:  04/04/18               Expected Discharge Plan:  [place]  In-House Referral:     Discharge planning Services  CM Consult  Post Acute Care Choice:    Choice offered to:  Patient  DME Arranged:  3-N-1 DME Agency:  [place]:  PT [facility] Agency:  [place]  Status of Service:     If discussed at [place] of Stay Meetings, dates discussed:    Additional Comments:  Leeroy Cha, RN 04/04/2018, 1:05 PM

## 2018-04-04 NOTE — Patient Outreach (Signed)
[place] [facility]) Care Management  04/04/2018  Lauren Richard 24-Jun-1936 [id number]   Patient was called regarding medication review post discharge. Unfortunately, patient did not answer the phone. HIPAA compliant message was left on her voicemail.  Plan: Send patient an unsuccessful contact letter. Call patient back within 3-5 business days.  [name], PharmD, [place] Clinical Pharmacist [phone]

## 2018-04-04 NOTE — Discharge Summary (Signed)
Physician Discharge Summary  FLORINE SPRENKLE KVQ:[id number] DOB: [birth year] DOA: 03/29/2018  PCP: Glendale Chard, MD  Admit date: 03/29/2018 Discharge date: 04/04/2018  Admitted From: Home Disposition: Home with Home Health PT/OT  Recommendations for Outpatient Follow-up:  1. Follow up with PCP in 1-2 weeks 2. Follow up with Oncology Dr. Burr Medico in 1 week to discuss Chemotherapy 3. Please obtain CMP/CBC, Mag, Phos in one week 4. Please follow up on the following pending results:  Home Health: Yes Equipment/Devices: 3in1    Discharge Condition: Stable  CODE STATUS: FULL CODE Diet recommendation: Regular Diet  Brief/Interim Summary: VARINA HULON a 82 y.o.femalewith medical history significant ofmetastatic pancreatic cancer, HTN, GERD, recent dx PE on xarelto who presentswith generalized weakness. She states that her chemotherapy regimen was increased about a week ago. Since then, she has noticed generalized weakness, nausea, vomiting 2-3 episodes this morning, diarrhea since Sunday 2-3 episodes. States that she feels weak overall, even taking a shower this morning took much effort. She has no other complaints of fevers, new or worsening cough, shortness of breath, chest pain, abdominal pain, dysuria. Admitted for Generalized Weakness, Nausea, Vomiting, and Diarrhea and is slowly improving then subsequently developed neutropenic fever and spiked a temperature of 101.3 overnight. Oncology was consulted and Recommended Granix and stopping once ANC is greater than 1500 and also recommended transfusing 1 unit of PRBCs. Started on Empiric antibiotics at this time with IV Zosyn switched to po Augmentin for total of 5 day course.  Her's insurance denied skilled nursing facility authorization and she is reevaluated by physical therapy who recommended home health at discharge.  She is deemed medically stable at this time to be discharged home and will need to follow-up with primary care physician  as well as medical oncology  Dr. Burr Medico in outpatient setting.    Discharge Diagnoses:  Principal Problem:   Generalized weakness Active Problems:   Hyperlipidemia   Pancreatic mass   Hypokalemia   Adenocarcinoma of head of pancreas (HCC)   Chemotherapy induced neutropenia (HCC)   Nausea & vomiting   Diarrhea  Neutropenic Fever, improved -Patient spiked a TMax of 101.3; ? Mucositis  -WBC improved and is now 16.0 (improved after Granix Administration)  -Blood cultures obtained in ED and showed no growth to date at 5 days -Repeat Blood Cx x2 Showed NGTD at 3 Days   -Initial Urinalysis showed hazy appearance with amber color, small hemoglobin, small leukocytes, rare bacteria, 11-20 squamous epithelial cells and 6-10 urine WBCs -Urine culture showed multiple species present as suggested recollection; however patient is not complaining of any urinary complaints or symptoms but will repeat urinalysis and urine culture given her neutropenic fever (Repeated prior to Abx administration)  -Repeat Urinalysis unremarkable and repeat Urine Cx showed <10,000 CFU of Insignificant Growth  -Continue to Monitor Daily CBC; WBC increasing from 0.9 -> 1.7 -> 2.8 -> 6.4 -> 12.4 -> 16.0 -Checked GI pathogen panel given her recent diarrhea and is Negative -CXR obtained and personally reviewed and showed no acute cardiopulmonary disease on my interpretation but official read by Radiologist still pending  -Empirically start IV Zosyn for coverage for neutropenic fever and changed to po Augmentin to complete 5 day course at the recommendation of Oncology Dr. Irene Limbo  -Continue to monitor cultures and repeat CBC with differential in the a.m. -Patient recieved Tbo-Filgrastim 480 mcg subcu once during hospitalization  -Continue to Monitor and follow up with PCP and Oncology as an outpatient   Generalized Weakness -Supportive care as  below -PT/ OT evaluated and Treated and recommending SNF vs. HHPT if improves -Social  Work Consulted in anticipation for SNF discharge and working on placement for patient and may D/C in next 24-48 hours if medically stable  -Insurance Denied SNF authorization and PT re-evaluated later this afternoon; PT recommending Home Healthy  -Patient to go Home with [facility] in AM with 3in1  Nausea, Vomiting, Diarrhea, improved -Likely secondary to chemotherapy; Nausea and vomiting improved and states that diarrhea is improving but still slightly there  -C/w Supportive Care and Antiemetics prn with Zofran 4 mg po/IV q6hprn -IVFNS reduced rate from 100 mL/hr to 75 mL/hr and changed to Sodium Bicarbonate because of Non-Gap Metabolic Acidosis but will now stop -Diet Advanced to Regular Diet  -Checked GI Pathogen Panel for Diarrhea and Negative; Likley Diarrhea was Chemotherapy induced though  -Patient was started on mirtazapine 7.5 mg nightly for appetite stimulant by her primary oncologist  Lactic Acidosis -Likely from Above and Significantly improved -Give NS 1 Liter bolus today and will continue IVF Maintenance at 100 mL/hr -LA went from 2.84 and then trended down to 1.6 and is now back up to 3.3; LA now trended down and is 1.5 -Will not recheck while hospitalized   Hypokalemia -Patient's K+ was 3.6 this AM -Continue to monitor and replete as necessary -Repeat CMP as an outpatient   Hypomagnesemia -Magnesium level is now 1.5 -Replete with IV mag sulfate 2 g prior to Discharge  -Continue to monitor and replete as necessary -Repeat Magnesium level as an outpatient   Hypophosphatemia -Patient's phosphorus level was 1.3 and improved to 2.6 -Continue to monitor and replete as necessary -Repeat phosphorus level as an outpatient   Metastatic Pancreatic Cancer (Adenocarcinoma of the Head of the Pancreas)  -Follows with Dr. Burr Medico and recently received Gemcitabine and Abraxane last week -CA 19-9 was 126 earlier this Month -Notified Dr. Burr Medico of Patient's Admission initiated  evaluation recommendations. -Dr. Morey Hummingbird started the patient on mirtazapine 7.5 mg p.o. nightly to see if this helps her appetite -Dr. Morey Hummingbird recommending follow-up 1 week after discharge to reevaluate her candidacy for chemotherapy in outpatient setting.  We will not have a port placed while she is here -Per Dr. Burr Medico she plans to see the patient back in 1 week to discuss chemotherapy again. -C/w Pain control with Tramadol 50 mg po q12hprn Moderate Pain  Recently  PE -Was on On Rivaroxaban 15 mg po BID and completed initial 21 days and is now on Xarelto 20 mg po Daily with supper   Transaminitis/Abnormal LFT's, improving  -Possibly related to chemotherapy, as noted by Dr. Ernestina Penna note -Stable from previous labsand slightly Trending down -AST is now down from 217 and is 64 -ALT is now down from 110 and is now 30 -Continue to Monitor and Trend LFT's as an outpatient  HTN -C/w Amlodipine 5 mg po Daily and with Atenolol 25 mg po Daily   GERD -C/w Pantoprazole 40 mg po Daily   Normocytic Anemia -Patient's hemoglobin/hematocrit dropped to 8.0/24.5 -After discussion with patient's primary oncologist Dr. Morey Hummingbird she recommends transfusion of 1 unit PRBCs -Type and screen ordered 1 unit to be transfused and after Transfusion her Hb/Hct improved to 9.8/29.6 and is now 10.0/31.3 -Likely Chemotherapy Induced Anemia  -Continue to monitor for signs and symptoms of bleeding -Repeat CBC as an outpatient   Hyperbilirubinemia -Mild. Patient's T bili went from 0.9 -> 1.3 -> 0.9 -> 0.7 -> 0/6 -Continue to monitor and repeat CMP as  an outpatient   Mild Hypoglycemia, improved  -Stable and patient was Asymptomatic   Non-Gap Metabolic Acidosis -Patient's AG was 6 yesterday and CO2 was 17 -Started Sodium Bicarbonate 150 mEQ at a rate of 75 mL/hr but will stop now as CO2 is 23 and Anion Gap is 6 -Repeat CMP as an outpatient   Discharge Instructions Discharge Instructions    Call MD for:  difficulty  breathing, headache or visual disturbances   Complete by:  As directed    Call MD for:  extreme fatigue   Complete by:  As directed    Call MD for:  hives   Complete by:  As directed    Call MD for:  persistant dizziness or light-headedness   Complete by:  As directed    Call MD for:  persistant nausea and vomiting   Complete by:  As directed    Call MD for:  redness, tenderness, or signs of infection (pain, swelling, redness, odor or green/yellow discharge around incision site)   Complete by:  As directed    Call MD for:  severe uncontrolled pain   Complete by:  As directed    Call MD for:  temperature >100.4   Complete by:  As directed    Diet general   Complete by:  As directed    Discharge instructions   Complete by:  As directed    Follow-up with primary care physician and oncology in outpatient setting.  Take all medications as prescribed.  If symptoms change or worsen please return to emergency room for evaluation.   Increase activity slowly   Complete by:  As directed      Allergies as of 04/04/2018   No Known Allergies     Medication List    STOP taking these medications   Rivaroxaban 15 & 20 MG Tbpk Replaced by:  rivaroxaban 20 MG Tabs tablet     TAKE these medications   amLODipine 5 MG tablet Commonly known as:  NORVASC Take 5 mg by mouth daily.   amoxicillin-clavulanate 875-125 MG tablet Commonly known as:  AUGMENTIN Take 1 tablet by mouth every 12 (twelve) hours for 6 doses.   aspirin 81 MG tablet Take 81 mg by mouth daily.   atenolol 25 MG tablet Commonly known as:  TENORMIN Take 1 tablet (25 mg total) by mouth daily.   feeding supplement (PRO-STAT SUGAR FREE 64) Liqd Take 30 mLs by mouth 2 (two) times daily.   gabapentin 100 MG capsule Commonly known as:  NEURONTIN Take 2 capsules (200 mg total) by mouth 2 (two) times daily.   magnesium oxide 400 (241.3 Mg) MG tablet Commonly known as:  MAG-OX Take 1 tablet (400 mg total) by mouth 2 (two)  times daily.   mirtazapine 7.5 MG tablet Commonly known as:  REMERON Take 1 tablet (7.5 mg total) by mouth at bedtime.   nystatin 100000 UNIT/ML suspension Commonly known as:  MYCOSTATIN Take 5 mLs (500,000 Units total) by mouth 4 (four) times daily. What changed:    when to take this  reasons to take this   ondansetron 8 MG tablet Commonly known as:  ZOFRAN Take 8 mg by mouth 2 (two) times daily as needed for nausea/vomiting.   pantoprazole 40 MG tablet Commonly known as:  PROTONIX Take 1 tablet (40 mg total) by mouth daily at 12 noon.   potassium chloride SA 20 MEQ tablet Commonly known as:  K-DUR,KLOR-CON Take 2 tablets (40 mEq total) by mouth daily. What changed:  how much to take   PRESCRIPTION MEDICATION Apply 1 application topically 4 (four) times daily as needed (for back pain). Dic 3%, Bac 2%, lid - pril 2-5%, Menth 1% cream   prochlorperazine 10 MG tablet Commonly known as:  COMPAZINE Take 10 mg by mouth every 6 (six) hours as needed for nausea or vomiting.   rivaroxaban 20 MG Tabs tablet Commonly known as:  XARELTO Take 1 tablet (20 mg total) by mouth daily with supper. Replaces:  Rivaroxaban 15 & 20 MG Tbpk   traMADol 50 MG tablet Commonly known as:  ULTRAM Take 50 mg by mouth every 12 (twelve) hours as needed (for pain.).   trolamine salicylate 10 % cream Commonly known as:  ASPERCREME Apply 1 application topically 4 (four) times daily as needed (for knee pain/muscle pain.).   Vitamin D 2000 units tablet Take 2,000 Units by mouth daily.            Durable Medical Equipment  (From admission, onward)        Start     Ordered   04/04/18 1140  DME 3-in-1  Once     04/04/18 1140      No Known Allergies  Consultations:  Medical Oncology Dr. Burr Medico  Procedures/Studies: Ct Chest W Contrast  Result Date: 03/09/2018 CLINICAL DATA:  History of pancreatic cancer diagnosed 11/2017. Patient on chemotherapy. Patient status post Whipple  procedure. Follow-up exam. EXAM: CT CHEST, ABDOMEN, AND PELVIS WITH CONTRAST TECHNIQUE: Multidetector CT imaging of the chest, abdomen and pelvis was performed following the standard protocol during bolus administration of intravenous contrast. CONTRAST:  168mL ISOVUE-300 IOPAMIDOL (ISOVUE-300) INJECTION 61% COMPARISON:  CT abdomen pelvis 12/01/2017; CT chest 10/18/2017. FINDINGS: CT CHEST FINDINGS Cardiovascular: Heart is mildly enlarged. Coronary arterial vascular calcifications. Thoracic aortic vascular calcifications. Filling defects demonstrated within the peripheral right main pulmonary artery extending into the right upper, middle and proximal right lower lobe pulmonary arteries. No CT evidence to suggest acute right heart strain. Filling defect within the right internal jugular vein centrally (image 5; series 7). Mediastinum/Nodes: No enlarged axillary, mediastinal or hilar lymphadenopathy. Normal esophagus. Lungs/Pleura: Central airways are patent. Patchy ground-glass and consolidative opacities demonstrated within the right-greater-than-left lower lobes. No pleural effusion. New 2 mm right upper lobe nodule (image 36; series). No pleural effusion or pneumothorax. Musculoskeletal: Thoracic spine degenerative changes. No aggressive or acute appearing osseous lesions. CT ABDOMEN PELVIS FINDINGS Hepatobiliary: Liver is extremely low in attenuation compatible with steatosis. Interval development of multiple peripherally enhancing lesions throughout the right and left hepatic lobe compatible with metastatic disease. Reference 2.0 x 1.8 cm lesion within the right hepatic lobe (image 40; series 2). Reference 1.4 x 1.0 cm lesion within the left hepatic lobe (image 16; series 2). Multiple additional lesions are demonstrated throughout the liver. Pneumobilia. No intrahepatic biliary ductal dilatation. Pancreas: Interval postsurgical changes compatible with Whipple procedure. Stent demonstrated within the pancreatic  duct within the body and tail. Interval formation of pancreaticojejunostomy. There is a small 1.5 x 0.7 cm low-attenuation lesion at the surgical site (image 96; series 7). Spleen: Unremarkable Adrenals/Urinary Tract: Stable right adrenal gland adenoma measuring 1.3 cm. Normal left adrenal gland. Kidneys enhance symmetrically with contrast. Unchanged cyst interpolar region left kidney. No hydronephrosis. Urinary bladder is unremarkable. Stomach/Bowel: Oral contrast material to the level of the rectum. No evidence for small bowel obstruction. No free fluid or free intraperitoneal air. Interval postprocedural changes compatible with Whipple procedure. There is mild wall thickening and surrounding fat stranding about the  proximal duodenum, potentially postsurgical in etiology. Vascular/Lymphatic: Normal caliber abdominal aorta. Peripheral calcified atherosclerotic plaque. No retroperitoneal lymphadenopathy. Reproductive: Status posthysterectomy. Other: None. Musculoskeletal: Lumbar spine degenerative changes. No aggressive or acute appearing osseous lesions. IMPRESSION: 1. Acute pulmonary embolus demonstrated within the distal right main pulmonary artery extending into the right upper, right middle and right lower lobes. 2. Filling defect within the central right internal jugular vein may represent nonocclusive thrombus. 3. Interval development of multiple enhancing lesions within the liver most compatible with hepatic metastatic disease. 4. Interval Whipple procedure. Small cystic lesion at the site of the pancreatic duodenal anastomosis, potentially postprocedural in etiology. Recommend attention on follow-up. 5. Wall thickening and surrounding fat stranding proximal duodenum, potentially postprocedural in etiology. Recommend attention on follow-up. 6. Critical Value/emergent results were called by telephone at the time of interpretation on 03/09/2018 at 2:49 pm to Dr. Truitt Merle , who verbally acknowledged these  results. Electronically Signed   By: Lovey Newcomer M.D.   On: 03/09/2018 14:50   Ct Abdomen Pelvis W Contrast  Result Date: 03/09/2018 CLINICAL DATA:  History of pancreatic cancer diagnosed 11/2017. Patient on chemotherapy. Patient status post Whipple procedure. Follow-up exam. EXAM: CT CHEST, ABDOMEN, AND PELVIS WITH CONTRAST TECHNIQUE: Multidetector CT imaging of the chest, abdomen and pelvis was performed following the standard protocol during bolus administration of intravenous contrast. CONTRAST:  129mL ISOVUE-300 IOPAMIDOL (ISOVUE-300) INJECTION 61% COMPARISON:  CT abdomen pelvis 12/01/2017; CT chest 10/18/2017. FINDINGS: CT CHEST FINDINGS Cardiovascular: Heart is mildly enlarged. Coronary arterial vascular calcifications. Thoracic aortic vascular calcifications. Filling defects demonstrated within the peripheral right main pulmonary artery extending into the right upper, middle and proximal right lower lobe pulmonary arteries. No CT evidence to suggest acute right heart strain. Filling defect within the right internal jugular vein centrally (image 5; series 7). Mediastinum/Nodes: No enlarged axillary, mediastinal or hilar lymphadenopathy. Normal esophagus. Lungs/Pleura: Central airways are patent. Patchy ground-glass and consolidative opacities demonstrated within the right-greater-than-left lower lobes. No pleural effusion. New 2 mm right upper lobe nodule (image 36; series). No pleural effusion or pneumothorax. Musculoskeletal: Thoracic spine degenerative changes. No aggressive or acute appearing osseous lesions. CT ABDOMEN PELVIS FINDINGS Hepatobiliary: Liver is extremely low in attenuation compatible with steatosis. Interval development of multiple peripherally enhancing lesions throughout the right and left hepatic lobe compatible with metastatic disease. Reference 2.0 x 1.8 cm lesion within the right hepatic lobe (image 40; series 2). Reference 1.4 x 1.0 cm lesion within the left hepatic lobe (image  16; series 2). Multiple additional lesions are demonstrated throughout the liver. Pneumobilia. No intrahepatic biliary ductal dilatation. Pancreas: Interval postsurgical changes compatible with Whipple procedure. Stent demonstrated within the pancreatic duct within the body and tail. Interval formation of pancreaticojejunostomy. There is a small 1.5 x 0.7 cm low-attenuation lesion at the surgical site (image 96; series 7). Spleen: Unremarkable Adrenals/Urinary Tract: Stable right adrenal gland adenoma measuring 1.3 cm. Normal left adrenal gland. Kidneys enhance symmetrically with contrast. Unchanged cyst interpolar region left kidney. No hydronephrosis. Urinary bladder is unremarkable. Stomach/Bowel: Oral contrast material to the level of the rectum. No evidence for small bowel obstruction. No free fluid or free intraperitoneal air. Interval postprocedural changes compatible with Whipple procedure. There is mild wall thickening and surrounding fat stranding about the proximal duodenum, potentially postsurgical in etiology. Vascular/Lymphatic: Normal caliber abdominal aorta. Peripheral calcified atherosclerotic plaque. No retroperitoneal lymphadenopathy. Reproductive: Status posthysterectomy. Other: None. Musculoskeletal: Lumbar spine degenerative changes. No aggressive or acute appearing osseous lesions. IMPRESSION: 1. Acute pulmonary embolus demonstrated  within the distal right main pulmonary artery extending into the right upper, right middle and right lower lobes. 2. Filling defect within the central right internal jugular vein may represent nonocclusive thrombus. 3. Interval development of multiple enhancing lesions within the liver most compatible with hepatic metastatic disease. 4. Interval Whipple procedure. Small cystic lesion at the site of the pancreatic duodenal anastomosis, potentially postprocedural in etiology. Recommend attention on follow-up. 5. Wall thickening and surrounding fat stranding proximal  duodenum, potentially postprocedural in etiology. Recommend attention on follow-up. 6. Critical Value/emergent results were called by telephone at the time of interpretation on 03/09/2018 at 2:49 pm to Dr. Truitt Merle , who verbally acknowledged these results. Electronically Signed   By: Lovey Newcomer M.D.   On: 03/09/2018 14:50   Dg Chest Port 1 View  Result Date: 04/02/2018 CLINICAL DATA:  Neutropenic fever EXAM: PORTABLE CHEST 1 VIEW COMPARISON:  01/09/2018 CXR, chest CT 03/09/2018 FINDINGS: The heart size and mediastinal contours are within normal limits. Minimal aortic atherosclerosis with slight uncoiling of the thoracic aorta. No pulmonary consolidation. Minimal bibasilar atelectasis. No significant effusion or pneumothorax. The visualized skeletal structures are unremarkable. IMPRESSION: Bibasilar atelectasis. Aortic atherosclerosis. No acute pulmonary consolidation or CHF. Electronically Signed   By: Ashley Royalty M.D.   On: 04/02/2018 19:21    Subjective: Seen and examined at bedside and was doing well.  States diarrhea has improved significantly but is still slightly lingering.  No chest pain, shortness breath, nausea, vomiting.  No abdominal pain.  Felt well and is ready to go home.  Discharge Exam: Vitals:   04/03/18 2000 04/04/18 0459  BP: 109/64 113/66  Pulse: 85 89  Resp: 18 18  Temp: 98.6 F (37 C) 98.8 F (37.1 C)  SpO2: 100% 94%   Vitals:   04/03/18 0530 04/03/18 1438 04/03/18 2000 04/04/18 0459  BP: 110/63 107/65 109/64 113/66  Pulse: 83 83 85 89  Resp: 18 17 18 18   Temp: 98.7 F (37.1 C) 98.4 F (36.9 C) 98.6 F (37 C) 98.8 F (37.1 C)  TempSrc: Oral Oral Oral Oral  SpO2: 99% 96% 100% 94%  Weight:       General: Pt is alert, awake, not in acute distress Cardiovascular: RRR, S1/S2 +, no rubs, no gallops Respiratory: Slightly diminished bilaterally, no wheezing, no rhonchi Abdominal: Soft, NT, ND, bowel sounds + Extremities: no edema, no cyanosis  The results of  significant diagnostics from this hospitalization (including imaging, microbiology, ancillary and laboratory) are listed below for reference.    Microbiology: Recent Results (from the past 240 hour(s))  Blood Culture (routine x 2)     Status: None   Collection Time: 03/29/18 12:38 PM  Result Value Ref Range Status   Specimen Description   Final    BLOOD LEFT HAND Performed at [facility], [place] [address], [place], [place] [address]    Special Requests   Final    BOTTLES DRAWN AEROBIC AND ANAEROBIC Blood Culture results may not be optimal due to an inadequate volume of blood received in culture bottles Performed at [facility] [address], [place], [place] [address]    Culture   Final    NO GROWTH 5 DAYS Performed at [facility], [place] [address], [place], [place] [address]    Report Status 04/03/2018 FINAL  Final  Blood Culture (routine x 2)     Status: None   Collection Time: 03/29/18  3:40 PM  Result Value Ref Range  Status   Specimen Description   Final    BLOOD RIGHT HAND Performed at [facility] [address], [place], [place] [address]    Special Requests   Final    BOTTLES DRAWN AEROBIC ONLY Blood Culture results may not be optimal due to an inadequate volume of blood received in culture bottles Performed at [facility] [address], [place], [place] [address]    Culture   Final    NO GROWTH 5 DAYS Performed at [facility], [place] [address], [place], [place] [address]    Report Status 04/03/2018 FINAL  Final  Urine culture     Status: Abnormal   Collection Time: 03/29/18  5:38 PM  Result Value Ref Range Status   Specimen Description   Final    URINE, RANDOM Performed at [facility] [address] [place], [place] [address]    Special Requests   Final    NONE Performed at [facility], [place] [address], [place], [place] [address]     Culture MULTIPLE SPECIES PRESENT, SUGGEST RECOLLECTION (A)  Final   Report Status 03/30/2018 FINAL  Final  Gastrointestinal Panel by PCR , Stool     Status: None   Collection Time: 03/31/18  8:25 AM  Result Value Ref Range Status   Campylobacter species NOT DETECTED NOT DETECTED Final   Plesimonas shigelloides NOT DETECTED NOT DETECTED Final   Salmonella species NOT DETECTED NOT DETECTED Final   Yersinia enterocolitica NOT DETECTED NOT DETECTED Final   Vibrio species NOT DETECTED NOT DETECTED Final   Vibrio cholerae NOT DETECTED NOT DETECTED Final   Enteroaggregative E coli (EAEC) NOT DETECTED NOT DETECTED Final   Enteropathogenic E coli (EPEC) NOT DETECTED NOT DETECTED Final   Enterotoxigenic E coli (ETEC) NOT DETECTED NOT DETECTED Final   Shiga like toxin producing E coli (STEC) NOT DETECTED NOT DETECTED Final   Shigella/Enteroinvasive E coli (EIEC) NOT DETECTED NOT DETECTED Final   Cryptosporidium NOT DETECTED NOT DETECTED Final   Cyclospora cayetanensis NOT DETECTED NOT DETECTED Final   Entamoeba histolytica NOT DETECTED NOT DETECTED Final   Giardia lamblia NOT DETECTED NOT DETECTED Final   Adenovirus F40/41 NOT DETECTED NOT DETECTED Final   Astrovirus NOT DETECTED NOT DETECTED Final   Norovirus GI/GII NOT DETECTED NOT DETECTED Final   Rotavirus A NOT DETECTED NOT DETECTED Final   Sapovirus (I, II, IV, and V) NOT DETECTED NOT DETECTED Final    Comment: Performed at [facility], [place] [place], [place] [address]  Culture, blood (x 2)     Status: None (Preliminary result)   Collection Time: 03/31/18  8:51 AM  Result Value Ref Range Status   Specimen Description   Final    BLOOD LEFT HAND Performed at [facility], [place] [address] [place], [place] [address]    Special Requests   Final    BOTTLES DRAWN AEROBIC ONLY Blood Culture adequate volume Performed at [place] [address] [place], [place] [address]     Culture   Final    NO GROWTH 4 DAYS Performed at [facility], [place] [address], [place], [place] [address]    Report Status PENDING  Incomplete  Culture, blood (x 2)     Status: None (Preliminary result)   Collection Time: 03/31/18  8:58 AM  Result Value Ref Range Status   Specimen Description   Final    BLOOD  RIGHT HAND Performed at [facility], [place] [address], [place], [place] [address]    Special Requests   Final    BOTTLES DRAWN AEROBIC ONLY Blood Culture adequate volume Performed at [place] [address] [place], [place] [address]    Culture   Final    NO GROWTH 4 DAYS Performed at [facility], [place] [address] [place], [place] [address]    Report Status PENDING  Incomplete  Culture, Urine     Status: Abnormal   Collection Time: 03/31/18  3:19 PM  Result Value Ref Range Status   Specimen Description   Final    URINE, CLEAN CATCH Performed at [facility], [place] [address] [place], [place] [address]    Special Requests   Final    NONE Performed at [facility], [place] [address] [place], [place] [address]    Culture (A)  Final    <10,000 COLONIES/mL INSIGNIFICANT GROWTH Performed at [place] [address], [place], [place] [address]    Report Status 04/01/2018 FINAL  Final    Labs: BNP (last 3 results) No results for input(s): BNP in the last 8760 hours. Basic Metabolic Panel: Recent Labs  Lab 03/31/18 0552 04/01/18 0557 04/02/18 0519 04/03/18 0550 04/04/18 0604  NA 140 138 138 138 137  K 4.3 3.9 3.4* 3.9 3.6  CL 114* 112* 112* 115* 108  CO2 21* 19* 20* 17* 23  GLUCOSE 100* 90 68 82 95  BUN <5* <5* 5* 5* 5*  CREATININE 0.76 0.75 0.81 0.74 0.69  CALCIUM 8.6* 8.4* 8.2* 8.0* 8.0*  MG 1.5* 1.7 1.6* 1.7 1.5*  PHOS 1.3* 3.2 3.1 2.5 2.6   Liver Function Tests: Recent Labs  Lab 03/31/18 0552 04/01/18 0557 04/02/18 0519 04/03/18 0550 04/04/18 0604   AST 117* 87* 58* 61* 64*  ALT 58* 48 36 32 30  ALKPHOS 70 76 79 106 119  BILITOT 0.9 1.3* 0.9 0.7 0.6  PROT 6.1* 5.9* 4.9* 4.7* 4.8*  ALBUMIN 2.4* 2.2* 1.8* 1.9* 1.8*   No results for input(s): LIPASE, AMYLASE in the last 168 hours. No results for input(s): AMMONIA in the last 168 hours. CBC: Recent Labs  Lab 03/31/18 0552 04/01/18 0557 04/02/18 0519 04/03/18 0550 04/04/18 0604  WBC 2.8* 6.4 12.4* 16.0* 15.5*  NEUTROABS 0.8* 2.5 8.1* 11.5* 9.6  HGB 8.0* 9.8* 9.5* 9.6* 10.0*  HCT 24.5* 29.6* 27.5* 29.4* 31.3*  MCV 91.4 89.7 90.5 92.2 92.6  PLT 164 205 200 186 187   Cardiac Enzymes: No results for input(s): CKTOTAL, CKMB, CKMBINDEX, TROPONINI in the last 168 hours. BNP: Invalid input(s): POCBNP CBG: Recent Labs  Lab 04/02/18 0733 04/02/18 0805 04/02/18 0836  GLUCAP 67 69 83   D-Dimer No results for input(s): DDIMER in the last 72 hours. Hgb A1c No results for input(s): HGBA1C in the last 72 hours. Lipid Profile No results for input(s): CHOL, HDL, LDLCALC, TRIG, CHOLHDL, LDLDIRECT in the last 72 hours. Thyroid function studies No results for input(s): TSH, T4TOTAL, T3FREE, THYROIDAB in the last 72 hours.  Invalid input(s): FREET3 Anemia work up No results for input(s): VITAMINB12, FOLATE, FERRITIN, TIBC, IRON, RETICCTPCT in the last 72 hours. Urinalysis    Component Value Date/Time   COLORURINE YELLOW 03/31/2018 [place] 03/31/2018 1519   LABSPEC 1.017 03/31/2018 1519   PHURINE 6.0 03/31/2018 1519   GLUCOSEU NEGATIVE 03/31/2018 1519   HGBUR NEGATIVE 03/31/2018 1519  BILIRUBINUR NEGATIVE 03/31/2018 1519   KETONESUR NEGATIVE 03/31/2018 1519   PROTEINUR NEGATIVE 03/31/2018 1519   NITRITE NEGATIVE 03/31/2018 1519   LEUKOCYTESUR NEGATIVE 03/31/2018 1519   Sepsis Labs Invalid input(s): PROCALCITONIN,  WBC,  LACTICIDVEN Microbiology Recent Results (from the past 240 hour(s))  Blood Culture (routine x 2)     Status: None   Collection Time:  03/29/18 12:38 PM  Result Value Ref Range Status   Specimen Description   Final    BLOOD LEFT HAND Performed at [facility], [place] [address] [place], [place] [address]    Special Requests   Final    BOTTLES DRAWN AEROBIC AND ANAEROBIC Blood Culture results may not be optimal due to an inadequate volume of blood received in culture bottles Performed at [place] [address] [place], [place] [address]    Culture   Final    NO GROWTH 5 DAYS Performed at [facility], [place] [address] [place], [place] [address]    Report Status 04/03/2018 FINAL  Final  Blood Culture (routine x 2)     Status: None   Collection Time: 03/29/18  3:40 PM  Result Value Ref Range Status   Specimen Description   Final    BLOOD RIGHT HAND Performed at [place] [address] [place], [place] [address]    Special Requests   Final    BOTTLES DRAWN AEROBIC ONLY Blood Culture results may not be optimal due to an inadequate volume of blood received in culture bottles Performed at [place] [address], [place], [place] [address]    Culture   Final    NO GROWTH 5 DAYS Performed at [facility], [place] [address], [place], [place] [address]    Report Status 04/03/2018 FINAL  Final  Urine culture     Status: Abnormal   Collection Time: 03/29/18  5:38 PM  Result Value Ref Range Status   Specimen Description   Final    URINE, RANDOM Performed at [place] [address], [place], [place] [address]    Special Requests   Final    NONE Performed at [facility], [place] [address], [place], [place] [address]    Culture MULTIPLE SPECIES PRESENT, SUGGEST RECOLLECTION (A)  Final   Report Status 03/30/2018 FINAL  Final  Gastrointestinal Panel by PCR , Stool     Status: None   Collection Time: 03/31/18  8:25 AM  Result Value Ref Range Status   Campylobacter species NOT  DETECTED NOT DETECTED Final   Plesimonas shigelloides NOT DETECTED NOT DETECTED Final   Salmonella species NOT DETECTED NOT DETECTED Final   Yersinia enterocolitica NOT DETECTED NOT DETECTED Final   Vibrio species NOT DETECTED NOT DETECTED Final   Vibrio cholerae NOT DETECTED NOT DETECTED Final   Enteroaggregative E coli (EAEC) NOT DETECTED NOT DETECTED Final   Enteropathogenic E coli (EPEC) NOT DETECTED NOT DETECTED Final   Enterotoxigenic E coli (ETEC) NOT DETECTED NOT DETECTED Final   Shiga like toxin producing E coli (STEC) NOT DETECTED NOT DETECTED Final   Shigella/Enteroinvasive E coli (EIEC) NOT DETECTED NOT DETECTED Final   Cryptosporidium NOT DETECTED NOT DETECTED Final   Cyclospora cayetanensis NOT DETECTED NOT DETECTED Final   Entamoeba histolytica NOT DETECTED NOT DETECTED Final   Giardia lamblia NOT DETECTED NOT DETECTED Final   Adenovirus F40/41 NOT DETECTED NOT DETECTED Final   Astrovirus NOT DETECTED NOT DETECTED  Final   Norovirus GI/GII NOT DETECTED NOT DETECTED Final   Rotavirus A NOT DETECTED NOT DETECTED Final   Sapovirus (I, II, IV, and V) NOT DETECTED NOT DETECTED Final    Comment: Performed at [facility], [place] [place], [place] [address]  Culture, blood (x 2)     Status: None (Preliminary result)   Collection Time: 03/31/18  8:51 AM  Result Value Ref Range Status   Specimen Description   Final    BLOOD LEFT HAND Performed at [place] [address] [place], [place] [address]    Special Requests   Final    BOTTLES DRAWN AEROBIC ONLY Blood Culture adequate volume Performed at [place] [address] [place], [place] [address]    Culture   Final    NO GROWTH 4 DAYS Performed at [facility], [place] [address], [place], [place] [address]    Report Status PENDING  Incomplete  Culture, blood (x 2)     Status: None (Preliminary result)   Collection Time: 03/31/18  8:58 AM  Result Value  Ref Range Status   Specimen Description   Final    BLOOD RIGHT HAND Performed at [place] [address] [place], [place] [address]    Special Requests   Final    BOTTLES DRAWN AEROBIC ONLY Blood Culture adequate volume Performed at [place] [address], [place], [place] [address]    Culture   Final    NO GROWTH 4 DAYS Performed at [facility], [place] [address] [place], [place] [address]    Report Status PENDING  Incomplete  Culture, Urine     Status: Abnormal   Collection Time: 03/31/18  3:19 PM  Result Value Ref Range Status   Specimen Description   Final    URINE, CLEAN CATCH Performed at [facility], [place] [address] [place], [place] [address]    Special Requests   Final    NONE Performed at [facility], [place] [address] [place], [place] [address]    Culture (A)  Final    <10,000 COLONIES/mL INSIGNIFICANT GROWTH Performed at [place] [address] [place] [address], [place],  [address]    Report Status 04/01/2018 FINAL  Final   Time coordinating discharge: 35 minutes  SIGNED:  Kerney Elbe, DO [facility] 04/04/2018, 11:41 AM Pager [phone]  If 7PM-7AM, please contact night-coverage [email or web address] Password [email or web address]

## 2018-04-04 NOTE — Consult Note (Addendum)
   [facility] Inpatient Consult   04/04/2018  Lauren Richard [birth year] [id number]    Patient screened for [place] Management program due to multiple hospitalizations and increased unplanned readmission risk score of 36%.  Spoke with Lauren Richard at bedside prior to hospital discharge. Discussed [facility] Care Management program services. She was agreeable and [facility] Care Management written consent obtained. [facility] Care Management folder provided.   Lauren Richard indicates she lives alone but her family will come and stay with her as needed.  Denies concerns with transportation. Reports her nephew takes her to MD appointments.  Denies having any concerns with obtaining medications. However, she states " I am on so many medications, I am not sure what I will be taking after I leave here". May benefit from medication review by [place] referral. Lauren Richard is agreeable to this plan.  Will make referral to [facility] for transition of care and [facility] Pharmacist for medication review.   Made inpatient RNCM aware that [place] Management will follow post discharge.  Lauren Richard has a medical history of metastatic pancreatic cancer (it does not appear goals of care has been addressed) HTN, GERD, PE.   Marthenia Rolling, MSN-Ed, RN,BSN [facility] Liaison [phone]

## 2018-04-05 ENCOUNTER — Ambulatory Visit: Payer: PPO | Admitting: Pharmacist

## 2018-04-05 ENCOUNTER — Other Ambulatory Visit: Payer: Self-pay | Admitting: *Deleted

## 2018-04-05 ENCOUNTER — Telehealth: Payer: Self-pay | Admitting: *Deleted

## 2018-04-05 DIAGNOSIS — I2699 Other pulmonary embolism without acute cor pulmonale: Secondary | ICD-10-CM | POA: Diagnosis not present

## 2018-04-05 DIAGNOSIS — C250 Malignant neoplasm of head of pancreas: Secondary | ICD-10-CM | POA: Diagnosis not present

## 2018-04-05 DIAGNOSIS — I700 Atherosclerosis of aorta: Secondary | ICD-10-CM | POA: Diagnosis not present

## 2018-04-05 DIAGNOSIS — I10 Essential (primary) hypertension: Secondary | ICD-10-CM | POA: Diagnosis not present

## 2018-04-05 DIAGNOSIS — E785 Hyperlipidemia, unspecified: Secondary | ICD-10-CM | POA: Diagnosis not present

## 2018-04-05 LAB — CULTURE, BLOOD (ROUTINE X 2)
Culture: NO GROWTH
Culture: NO GROWTH
Special Requests: ADEQUATE
Special Requests: ADEQUATE

## 2018-04-05 NOTE — Patient Outreach (Signed)
[facility]) Care Management  04/05/2018  Lauren Richard 1936-09-29 [id number]   Referral received 6/25 Discharge Date  RN outreached to pt today and discussed [facility] services and the purpose for today's call. Pt familiar with [facility] and has spoke with the RN recently on another discharge. Pt states she is doing fine and is currently involved with a [place] who will provider PT services. Pt states the only new medication added to her list of medications was an antibiotic and her medications have been reviewed and she is comfortable with taking all her medications which are the same on her discharge sheet. Pt opt to decline to review medication once again. RN inquired further on any needed resources or concerns about any of her medications. Pt aware she may get call via [place] referral. Pt states she is doing okay and has my number if there is anything that she needs to inquire about or request in the future however at this time pt does not wish to received weekly transition of care calls or contacts of inquires but again will contact [facility] or this RN case manager with any concerns. No other inquires or request discussed at this time. This discipline will discontinue and Denyse Amass (assigned [place]) will be notified along with her provider Dr. Baird Cancer.  Raina Mina, RN Care Management Coordinator [place] Office [phone]

## 2018-04-05 NOTE — Telephone Encounter (Signed)
Called pt and left message on voice mail informing pt NOT to come in tomorrow 04/06/18 for appts - since pt was discharged from the hospital on 04/05/18.  Noted that pt has been rescheduled for 04/14/18.

## 2018-04-06 ENCOUNTER — Inpatient Hospital Stay: Payer: PPO | Admitting: Hematology

## 2018-04-06 ENCOUNTER — Inpatient Hospital Stay: Payer: PPO

## 2018-04-06 ENCOUNTER — Inpatient Hospital Stay: Payer: PPO | Admitting: Nutrition

## 2018-04-07 ENCOUNTER — Other Ambulatory Visit: Payer: Self-pay | Admitting: Pharmacist

## 2018-04-07 ENCOUNTER — Ambulatory Visit: Payer: Self-pay | Admitting: Pharmacist

## 2018-04-07 ENCOUNTER — Telehealth: Payer: Self-pay | Admitting: Genetics

## 2018-04-07 NOTE — Patient Outreach (Addendum)
[facility]) Care Management  04/07/2018  Lauren Richard 18-Apr-1936 [id number]   Patient was called regarding medication review post discharge. Unfortunately, she did not answer either number listed in her chart. HIPAA compliant messages were left on both voicemail boxes.  Plan: Schedule patient with Ralene Bathe, PharmD in 5  business days for third call back.   (Due to PAL)  [name], PharmD, [place] Clinical Pharmacist [phone]

## 2018-04-07 NOTE — Telephone Encounter (Signed)
Left message asking pt. To call me back to discuss genetic test results.

## 2018-04-10 ENCOUNTER — Other Ambulatory Visit: Payer: Self-pay

## 2018-04-10 ENCOUNTER — Encounter (HOSPITAL_COMMUNITY): Payer: Self-pay | Admitting: *Deleted

## 2018-04-10 DIAGNOSIS — I2699 Other pulmonary embolism without acute cor pulmonale: Secondary | ICD-10-CM | POA: Diagnosis not present

## 2018-04-10 DIAGNOSIS — C250 Malignant neoplasm of head of pancreas: Secondary | ICD-10-CM | POA: Diagnosis not present

## 2018-04-10 DIAGNOSIS — I700 Atherosclerosis of aorta: Secondary | ICD-10-CM | POA: Diagnosis not present

## 2018-04-10 DIAGNOSIS — I10 Essential (primary) hypertension: Secondary | ICD-10-CM | POA: Diagnosis not present

## 2018-04-10 DIAGNOSIS — E785 Hyperlipidemia, unspecified: Secondary | ICD-10-CM | POA: Diagnosis not present

## 2018-04-10 NOTE — Progress Notes (Signed)
I spoke to Lauren Richard who said that she will be here at 5:30 AM.  Patient states that she is to not take Xarelto in am. I informed patient the she could not have any food, gum or hard candy after midnight.  I instructed patient that after midnight she may drink clear liquids until 4:30 am, we reviewed what clear liquid consist of, patient's nephew was in the back ground reviewing the information.

## 2018-04-10 NOTE — Anesthesia Preprocedure Evaluation (Signed)
Anesthesia Evaluation  Patient identified by MRN, date of birth, ID band Patient awake    Reviewed: Allergy & Precautions, H&P , NPO status , Patient's Chart, lab work & pertinent test results, reviewed documented beta blocker date and time   Airway Mallampati: II  TM Distance: >3 FB Neck ROM: Full    Dental no notable dental hx. (+) Edentulous Upper, Edentulous Lower, Dental Advisory Given   Pulmonary neg pulmonary ROS,    Pulmonary exam normal breath sounds clear to auscultation       Cardiovascular hypertension, Pt. on medications and Pt. on home beta blockers  Rhythm:Regular Rate:Normal     Neuro/Psych negative neurological ROS     GI/Hepatic   Endo/Other  Hypothyroidism Pancreatic CA  Renal/GU      Musculoskeletal  (+) Arthritis , Osteoarthritis,    Abdominal   Peds  Hematology  (+) anemia ,   Anesthesia Other Findings   Reproductive/Obstetrics                             Anesthesia Physical  Anesthesia Plan  ASA: III  Anesthesia Plan: General   Post-op Pain Management:    Induction: Intravenous  PONV Risk Score and Plan: 3 and Ondansetron, Dexamethasone and Treatment may vary due to age or medical condition  Airway Management Planned: Oral ETT and LMA  Additional Equipment: Arterial line, CVP and Ultrasound Guidance Line Placement  Intra-op Plan:   Post-operative Plan: Extubation in OR  Informed Consent: I have reviewed the patients History and Physical, chart, labs and discussed the procedure including the risks, benefits and alternatives for the proposed anesthesia with the patient or authorized representative who has indicated his/her understanding and acceptance.   Dental advisory given  Plan Discussed with: CRNA, Anesthesiologist and Surgeon  Anesthesia Plan Comments:         Anesthesia Quick Evaluation

## 2018-04-10 NOTE — Progress Notes (Addendum)
I spoke to Lauren Richard, who said she was told that surgery will be next week.  I called Dr Norman Clay 's office and spoke to [facility], who said Lauren Richard's surgery is tomorrow.  I asked for someone to call Lauren Richard and speak with her and to call me back to let me know if Lauren Richard is going to have surgery tomorrow.  I called Lauren Richard back and told her that she will be receiving a call from Dr.Bylery's office regarding surgery date and time.  Lauren Richard said, " I am not having surgery tomorrow, I just got out of the hospital and I'm too weak."  I asked Lauren Richard if she feels her condition is the same as when she was hospitalized, Lauren Richard responded that she feels a little better, but not back to normal.  I asked Lauren Richard if anyone had instructed her to hold Xarelto prior to surgery, she said no, and that she took it this am. I called Sunday Spillers at Dr Marlowe Aschoff office and informed her of what Lauren Richard had told me.  I also asked if the office had given Lauren Richard information regarding holding Xarelto, she was unaware of this and I asked if the prescriber of Xarelto had been consulted if she was suppose to hold it. Sunday Spillers said she would forward the questions to Dr Alessandra Bevels.  Sunday Spillers called by after speaking to Dr Barry Dienes and said that Dr Norman Clay wants to do the surgery tomorrow, and that Dr Alessandra Bevels spoke to prescriber of [name] regarding holding Xarelto.  Dr Barry Dienes wants Lauren Richard to hod Xarelto tomorrow am.  I asked Sunday Spillers to have office call Lauren Richard with instructions, as Lauren Richard has told me twice that surgery is not tomorrow.

## 2018-04-11 ENCOUNTER — Encounter (HOSPITAL_COMMUNITY)
Admission: RE | Disposition: A | Discharge status: Home / Self Care | Payer: Self-pay | Source: Ambulatory Visit | Attending: General Surgery

## 2018-04-11 ENCOUNTER — Ambulatory Visit (HOSPITAL_COMMUNITY): Payer: PPO | Admitting: Certified Registered"

## 2018-04-11 ENCOUNTER — Encounter (HOSPITAL_COMMUNITY): Payer: Self-pay | Admitting: *Deleted

## 2018-04-11 ENCOUNTER — Ambulatory Visit (HOSPITAL_COMMUNITY)
Admission: RE | Admit: 2018-04-11 | Discharge: 2018-04-11 | Disposition: A | Discharge status: Home / Self Care | Payer: PPO | Source: Ambulatory Visit | Attending: General Surgery | Admitting: General Surgery

## 2018-04-11 ENCOUNTER — Ambulatory Visit (HOSPITAL_COMMUNITY): Payer: PPO

## 2018-04-11 DIAGNOSIS — I129 Hypertensive chronic kidney disease with stage 1 through stage 4 chronic kidney disease, or unspecified chronic kidney disease: Secondary | ICD-10-CM | POA: Diagnosis not present

## 2018-04-11 DIAGNOSIS — I10 Essential (primary) hypertension: Secondary | ICD-10-CM | POA: Diagnosis not present

## 2018-04-11 DIAGNOSIS — Z96652 Presence of left artificial knee joint: Secondary | ICD-10-CM | POA: Insufficient documentation

## 2018-04-11 DIAGNOSIS — Z7982 Long term (current) use of aspirin: Secondary | ICD-10-CM | POA: Diagnosis not present

## 2018-04-11 DIAGNOSIS — Z803 Family history of malignant neoplasm of breast: Secondary | ICD-10-CM | POA: Diagnosis not present

## 2018-04-11 DIAGNOSIS — C259 Malignant neoplasm of pancreas, unspecified: Secondary | ICD-10-CM | POA: Diagnosis not present

## 2018-04-11 DIAGNOSIS — Z8051 Family history of malignant neoplasm of kidney: Secondary | ICD-10-CM | POA: Diagnosis not present

## 2018-04-11 DIAGNOSIS — C250 Malignant neoplasm of head of pancreas: Secondary | ICD-10-CM | POA: Diagnosis not present

## 2018-04-11 DIAGNOSIS — Z7901 Long term (current) use of anticoagulants: Secondary | ICD-10-CM | POA: Diagnosis not present

## 2018-04-11 DIAGNOSIS — Z95828 Presence of other vascular implants and grafts: Secondary | ICD-10-CM

## 2018-04-11 DIAGNOSIS — N189 Chronic kidney disease, unspecified: Secondary | ICD-10-CM | POA: Diagnosis not present

## 2018-04-11 DIAGNOSIS — Z452 Encounter for adjustment and management of vascular access device: Secondary | ICD-10-CM | POA: Diagnosis not present

## 2018-04-11 DIAGNOSIS — Z800 Family history of malignant neoplasm of digestive organs: Secondary | ICD-10-CM | POA: Insufficient documentation

## 2018-04-11 DIAGNOSIS — Z419 Encounter for procedure for purposes other than remedying health state, unspecified: Secondary | ICD-10-CM

## 2018-04-11 DIAGNOSIS — Z79899 Other long term (current) drug therapy: Secondary | ICD-10-CM | POA: Diagnosis not present

## 2018-04-11 HISTORY — PX: PORTACATH PLACEMENT: SHX2246

## 2018-04-11 LAB — CBC
HCT: 34 % — ABNORMAL LOW (ref 36.0–46.0)
Hemoglobin: 11 g/dL — ABNORMAL LOW (ref 12.0–15.0)
MCH: 30.1 pg (ref 26.0–34.0)
MCHC: 32.4 g/dL (ref 30.0–36.0)
MCV: 92.9 fL (ref 78.0–100.0)
Platelets: 293 10*3/uL (ref 150–400)
RBC: 3.66 MIL/uL — ABNORMAL LOW (ref 3.87–5.11)
RDW: 19.9 % — AB (ref 11.5–15.5)
WBC: 13.5 10*3/uL — AB (ref 4.0–10.5)

## 2018-04-11 LAB — BASIC METABOLIC PANEL
ANION GAP: 12 (ref 5–15)
BUN: 6 mg/dL — ABNORMAL LOW (ref 8–23)
CALCIUM: 8.3 mg/dL — AB (ref 8.9–10.3)
CO2: 22 mmol/L (ref 22–32)
CREATININE: 0.87 mg/dL (ref 0.44–1.00)
Chloride: 102 mmol/L (ref 98–111)
Glucose, Bld: 87 mg/dL (ref 70–99)
Potassium: 2.5 mmol/L — CL (ref 3.5–5.1)
SODIUM: 136 mmol/L (ref 135–145)

## 2018-04-11 SURGERY — INSERTION, TUNNELED CENTRAL VENOUS DEVICE, WITH PORT
Anesthesia: General | Site: Chest | Laterality: Left

## 2018-04-11 MED ORDER — LIDOCAINE HCL (PF) 1 % IJ SOLN
INTRAMUSCULAR | Status: AC
  Filled 2018-04-11: qty 30

## 2018-04-11 MED ORDER — PHENYLEPHRINE 40 MCG/ML (10ML) SYRINGE FOR IV PUSH (FOR BLOOD PRESSURE SUPPORT)
PREFILLED_SYRINGE | INTRAVENOUS | Status: AC
  Filled 2018-04-11: qty 10

## 2018-04-11 MED ORDER — LACTATED RINGERS IV SOLN
INTRAVENOUS | Status: DC
  Administered 2018-04-11: 07:00:00 via INTRAVENOUS

## 2018-04-11 MED ORDER — CEFAZOLIN SODIUM-DEXTROSE 2-4 GM/100ML-% IV SOLN
2.0000 g | INTRAVENOUS | Status: AC
  Administered 2018-04-11: 2 g via INTRAVENOUS
  Filled 2018-04-11: qty 100

## 2018-04-11 MED ORDER — ONDANSETRON HCL 4 MG/2ML IJ SOLN
INTRAMUSCULAR | Status: AC
Start: 2018-04-11 — End: ?
  Filled 2018-04-11: qty 2

## 2018-04-11 MED ORDER — SODIUM CHLORIDE 0.9 % IV SOLN
INTRAVENOUS | Status: AC
  Filled 2018-04-11: qty 1.2

## 2018-04-11 MED ORDER — EPHEDRINE SULFATE-NACL 50-0.9 MG/10ML-% IV SOSY
PREFILLED_SYRINGE | INTRAVENOUS | Status: DC | PRN
  Administered 2018-04-11: 5 mg via INTRAVENOUS
  Administered 2018-04-11: 10 mg via INTRAVENOUS

## 2018-04-11 MED ORDER — BUPIVACAINE-EPINEPHRINE (PF) 0.25% -1:200000 IJ SOLN
INTRAMUSCULAR | Status: AC
  Filled 2018-04-11: qty 30

## 2018-04-11 MED ORDER — PROPOFOL 10 MG/ML IV BOLUS
INTRAVENOUS | Status: AC
  Filled 2018-04-11: qty 20

## 2018-04-11 MED ORDER — FENTANYL CITRATE (PF) 100 MCG/2ML IJ SOLN
INTRAMUSCULAR | Status: DC | PRN
  Administered 2018-04-11: 25 ug via INTRAVENOUS

## 2018-04-11 MED ORDER — DEXAMETHASONE SODIUM PHOSPHATE 10 MG/ML IJ SOLN
INTRAMUSCULAR | Status: DC | PRN
  Administered 2018-04-11: 10 mg via INTRAVENOUS

## 2018-04-11 MED ORDER — LIDOCAINE 2% (20 MG/ML) 5 ML SYRINGE
INTRAMUSCULAR | Status: AC
  Filled 2018-04-11: qty 5

## 2018-04-11 MED ORDER — PHENYLEPHRINE 40 MCG/ML (10ML) SYRINGE FOR IV PUSH (FOR BLOOD PRESSURE SUPPORT)
PREFILLED_SYRINGE | INTRAVENOUS | Status: DC | PRN
  Administered 2018-04-11: 120 ug via INTRAVENOUS
  Administered 2018-04-11 (×8): 80 ug via INTRAVENOUS

## 2018-04-11 MED ORDER — FENTANYL CITRATE (PF) 250 MCG/5ML IJ SOLN
INTRAMUSCULAR | Status: AC
  Filled 2018-04-11: qty 5

## 2018-04-11 MED ORDER — CHLORHEXIDINE GLUCONATE CLOTH 2 % EX PADS: 6.0000 | MEDICATED_PAD | Freq: Once | CUTANEOUS | Status: DC

## 2018-04-11 MED ORDER — LIDOCAINE HCL 1 % IJ SOLN
INTRAMUSCULAR | Status: DC | PRN
  Administered 2018-04-11: 12 mL via SUBCUTANEOUS

## 2018-04-11 MED ORDER — HEPARIN SOD (PORK) LOCK FLUSH 100 UNIT/ML IV SOLN
INTRAVENOUS | Status: AC
  Filled 2018-04-11: qty 5

## 2018-04-11 MED ORDER — POTASSIUM CHLORIDE 10 MEQ/100ML IV SOLN
10.0000 meq | INTRAVENOUS | Status: AC
  Administered 2018-04-11 (×2): 10 meq via INTRAVENOUS
  Filled 2018-04-11: qty 100

## 2018-04-11 MED ORDER — 0.9 % SODIUM CHLORIDE (POUR BTL) OPTIME
TOPICAL | Status: DC | PRN
  Administered 2018-04-11: 1000 mL

## 2018-04-11 MED ORDER — ACETAMINOPHEN 500 MG PO TABS
1000.0000 mg | ORAL_TABLET | ORAL | Status: AC
  Administered 2018-04-11: 1000 mg via ORAL
  Filled 2018-04-11: qty 2

## 2018-04-11 MED ORDER — DEXAMETHASONE SODIUM PHOSPHATE 10 MG/ML IJ SOLN
INTRAMUSCULAR | Status: AC
  Filled 2018-04-11: qty 1

## 2018-04-11 MED ORDER — HEPARIN SOD (PORK) LOCK FLUSH 100 UNIT/ML IV SOLN
INTRAVENOUS | Status: DC | PRN
  Administered 2018-04-11: 500 [IU] via INTRAVENOUS

## 2018-04-11 MED ORDER — ONDANSETRON HCL 4 MG/2ML IJ SOLN
INTRAMUSCULAR | Status: DC | PRN
  Administered 2018-04-11: 4 mg via INTRAVENOUS

## 2018-04-11 MED ORDER — SODIUM CHLORIDE 0.9 % IV SOLN
INTRAVENOUS | Status: DC | PRN
  Administered 2018-04-11: 09:00:00

## 2018-04-11 MED ORDER — LIDOCAINE 2% (20 MG/ML) 5 ML SYRINGE
INTRAMUSCULAR | Status: DC | PRN
  Administered 2018-04-11: 60 mg via INTRAVENOUS

## 2018-04-11 MED ORDER — PROPOFOL 10 MG/ML IV BOLUS
INTRAVENOUS | Status: DC | PRN
  Administered 2018-04-11: 80 mg via INTRAVENOUS

## 2018-04-11 MED ORDER — GABAPENTIN 100 MG PO CAPS
100.0000 mg | ORAL_CAPSULE | ORAL | Status: AC
  Administered 2018-04-11: 100 mg via ORAL
  Filled 2018-04-11: qty 1

## 2018-04-11 SURGICAL SUPPLY — 41 items
BAG DECANTER FOR FLEXI CONT (MISCELLANEOUS) ×3 IMPLANT
BLADE SURG 11 STRL SS (BLADE) ×3 IMPLANT
BLADE SURG 15 STRL LF DISP TIS (BLADE) ×1 IMPLANT
BLADE SURG 15 STRL SS (BLADE) ×2
CHLORAPREP W/TINT 10.5 ML (MISCELLANEOUS) ×3 IMPLANT
COVER SURGICAL LIGHT HANDLE (MISCELLANEOUS) ×3 IMPLANT
COVER TRANSDUCER ULTRASND GEL (DRAPE) IMPLANT
CRADLE DONUT ADULT HEAD (MISCELLANEOUS) ×3 IMPLANT
DECANTER SPIKE VIAL GLASS SM (MISCELLANEOUS) ×6 IMPLANT
DERMABOND ADVANCED (GAUZE/BANDAGES/DRESSINGS) ×2
DERMABOND ADVANCED .7 DNX12 (GAUZE/BANDAGES/DRESSINGS) ×1 IMPLANT
DRAPE C-ARM 42X72 X-RAY (DRAPES) ×3 IMPLANT
DRAPE CHEST BREAST 15X10 FENES (DRAPES) ×3 IMPLANT
DRAPE UTILITY XL STRL (DRAPES) ×3 IMPLANT
DRAPE WARM FLUID 44X44 (DRAPE) IMPLANT
ELECT COATED BLADE 2.86 ST (ELECTRODE) ×3 IMPLANT
ELECT REM PT RETURN 9FT ADLT (ELECTROSURGICAL) ×3
ELECTRODE REM PT RTRN 9FT ADLT (ELECTROSURGICAL) ×1 IMPLANT
GAUZE SPONGE 4X4 16PLY XRAY LF (GAUZE/BANDAGES/DRESSINGS) ×3 IMPLANT
GEL ULTRASOUND 20GR AQUASONIC (MISCELLANEOUS) IMPLANT
GLOVE BIO SURGEON STRL SZ 6 (GLOVE) ×3 IMPLANT
GLOVE INDICATOR 6.5 STRL GRN (GLOVE) ×3 IMPLANT
GOWN STRL REUS W/ TWL LRG LVL3 (GOWN DISPOSABLE) ×1 IMPLANT
GOWN STRL REUS W/TWL 2XL LVL3 (GOWN DISPOSABLE) ×3 IMPLANT
GOWN STRL REUS W/TWL LRG LVL3 (GOWN DISPOSABLE) ×2
KIT BASIN OR (CUSTOM PROCEDURE TRAY) ×3 IMPLANT
KIT PORT POWER 8FR ISP CVUE (Port) ×3 IMPLANT
KIT TURNOVER KIT B (KITS) ×3 IMPLANT
NEEDLE HYPO 25GX1X1/2 BEV (NEEDLE) ×3 IMPLANT
NS IRRIG 1000ML POUR BTL (IV SOLUTION) ×3 IMPLANT
PACK SURGICAL SETUP 50X90 (CUSTOM PROCEDURE TRAY) ×3 IMPLANT
PAD ARMBOARD 7.5X6 YLW CONV (MISCELLANEOUS) ×3 IMPLANT
PENCIL BUTTON HOLSTER BLD 10FT (ELECTRODE) ×3 IMPLANT
SUT MON AB 4-0 PC3 18 (SUTURE) ×3 IMPLANT
SUT PROLENE 2 0 SH DA (SUTURE) ×6 IMPLANT
SUT VIC AB 3-0 SH 27 (SUTURE) ×2
SUT VIC AB 3-0 SH 27X BRD (SUTURE) ×1 IMPLANT
SYR 20ML ECCENTRIC (SYRINGE) ×6 IMPLANT
SYR 5ML LUER SLIP (SYRINGE) ×3 IMPLANT
SYR CONTROL 10ML LL (SYRINGE) ×3 IMPLANT
TOWEL GREEN STERILE (TOWEL DISPOSABLE) ×3 IMPLANT

## 2018-04-11 NOTE — Anesthesia Postprocedure Evaluation (Signed)
Anesthesia Post Note  Patient: KARLEIGH BUNTE  Procedure(s) Performed: INSERTION PORT-A-CATH (Left Chest)     Patient location during evaluation: PACU Anesthesia Type: General Level of consciousness: awake and alert Pain management: pain level controlled Vital Signs Assessment: post-procedure vital signs reviewed and stable Respiratory status: spontaneous breathing, nonlabored ventilation, respiratory function stable and patient connected to nasal cannula oxygen Cardiovascular status: blood pressure returned to baseline and stable Postop Assessment: no apparent nausea or vomiting Anesthetic complications: no    Last Vitals:  Vitals:   04/11/18 1007 04/11/18 1010  BP:  118/67  Pulse: 72 85  Resp: 15 16  Temp:  (!) 36.3 C  SpO2: 99% 100%    Last Pain:  Vitals:   04/11/18 1010  PainSc: 0-No pain                 Geraldine Sandberg

## 2018-04-11 NOTE — Discharge Instructions (Addendum)
[facility] Office Phone Number [phone]   POST OP INSTRUCTIONS  Always review your discharge instruction sheet given to you by the facility where your surgery was performed.  IF YOU HAVE DISABILITY OR FAMILY LEAVE FORMS, YOU MUST BRING THEM TO THE OFFICE FOR PROCESSING.  DO NOT GIVE THEM TO YOUR DOCTOR.  1. A prescription for pain medication may be given to you upon discharge.  Take your pain medication as prescribed, if needed.  If narcotic pain medicine is not needed, then you may take acetaminophen (Tylenol) or ibuprofen (Advil) as needed. 2. Take your usually prescribed medications unless otherwise directed 3. If you need a refill on your pain medication, please contact your pharmacy.  They will contact our office to request authorization.  Prescriptions will not be filled after 5pm or on week-ends. 4. You should eat very light the first 24 hours after surgery, such as soup, crackers, pudding, etc.  Resume your normal diet the day after surgery 5. It is common to experience some constipation if taking pain medication after surgery.  Increasing fluid intake and taking a stool softener will usually help or prevent this problem from occurring.  A mild laxative (Milk of Magnesia or Miralax) should be taken according to package directions if there are no bowel movements after 48 hours. 6. You may shower in 48 hours.  The surgical glue will flake off in 2-3 weeks.   7. ACTIVITIES:  No strenuous activity or heavy lifting for 1 week.   a. You may drive when you no longer are taking prescription pain medication, you can comfortably wear a seatbelt, and you can safely maneuver your car and apply brakes. b. RETURN TO WORK:  __________n/a_______________ You should see your doctor in the office for a follow-up appointment approximately three-four weeks after your surgery.    WHEN TO CALL YOUR DOCTOR: 1. Fever over 101.0 2. Nausea and/or vomiting. 3. Extreme swelling or  bruising. 4. Continued bleeding from incision. 5. Increased pain, redness, or drainage from the incision.  The clinic staff is available to answer your questions during regular business hours.  Please don't hesitate to call and ask to speak to one of the nurses for clinical concerns.  If you have a medical emergency, go to the nearest emergency room or call 911.  A surgeon from [facility] is always on call at the hospital.  For further questions, please visit [email or web address]     Post Anesthesia Home Care Instructions  Activity: Get plenty of rest for the remainder of the day. A responsible individual must stay with you for 24 hours following the procedure.  For the next 24 hours, DO NOT: -Drive a car -Operate machinery -Drink alcoholic beverages -Take any medication unless instructed by your physician -Make any legal decisions or sign important papers.  Meals: Start with liquid foods such as gelatin or soup. Progress to regular foods as tolerated. Avoid greasy, spicy, heavy foods. If nausea and/or vomiting occur, drink only clear liquids until the nausea and/or vomiting subsides. Call your physician if vomiting continues.  Special Instructions/Symptoms: Your throat may feel dry or sore from the anesthesia or the breathing tube placed in your throat during surgery. If this causes discomfort, gargle with warm salt water. The discomfort should disappear within 24 hours.  If you had a scopolamine patch placed behind your ear for the management of post- operative nausea and/or vomiting:  1. The medication in the patch is effective for 72 hours, after which it should   removed.  Wrap patch in a tissue and discard in the trash. Wash hands thoroughly with soap and water. °2. You may remove the patch earlier than 72 hours if you experience unpleasant side effects which may include dry mouth, dizziness or visual disturbances. °3. Avoid touching the patch. Wash your hands with  soap and water after contact with the patch. °  ° °

## 2018-04-11 NOTE — Anesthesia Procedure Notes (Signed)
Procedure Name: LMA Insertion Date/Time: 04/11/2018 7:53 AM Performed by: Gwyndolyn Saxon, CRNA Pre-anesthesia Checklist: Patient identified, Emergency Drugs available, Suction available and Patient being monitored Patient Re-evaluated:Patient Re-evaluated prior to induction Oxygen Delivery Method: Circle system utilized Preoxygenation: Pre-oxygenation with 100% oxygen Induction Type: IV induction Ventilation: Mask ventilation without difficulty LMA: LMA inserted LMA Size: 4.0 Number of attempts: 1 Placement Confirmation: positive ETCO2,  CO2 detector and breath sounds checked- equal and bilateral Tube secured with: Tape Dental Injury: Teeth and Oropharynx as per pre-operative assessment

## 2018-04-11 NOTE — Op Note (Signed)
PREOPERATIVE DIAGNOSIS:  Pancreatic cancer     POSTOPERATIVE DIAGNOSIS:  Same     PROCEDURE: Left subclavian port placement, Bard ClearVue  Power Port, MRI safe, 8-French.      SURGEON:  Ohana Birdwell, MD      ANESTHESIA:  General   FINDINGS:  Good venous return, easy flush, and tip of the catheter and   SVC 22.5 cm.      SPECIMEN:  None.      ESTIMATED BLOOD LOSS:  Minimal.      COMPLICATIONS:  None known.      PROCEDURE:  Pt was identified in the holding area and taken to   the operating room, where patient was placed supine on the operating room   table.  General anesthesia was induced.  Patient's arms were tucked and the upper   chest and neck were prepped and draped in sterile fashion.  Time-out was   performed according to the surgical safety check list.  When all was   correct, we continued.   Local anesthetic was administered over this   area at the angle of the clavicle.  The vein was accessed with 1 pass(es) of the needle. There was good venous return and the wire passed easily with no ectopy.   Fluoroscopy was used to confirm that the wire was in the vena cava.      The patient was placed back level and the area for the pocket was anethetized   with local anesthetic.  A 3-cm transverse incision was made with a #15   blade.  Cautery was used to divide the subcutaneous tissues down to the   pectoralis muscle.  An [employer] retractor was used to elevate the skin   while a pocket was created on top of the pectoralis fascia.  The port   was placed into the pocket to confirm that it was of adequate size.  The   catheter was preattached to the port.  The port was then secured to the   pectoralis fascia with four 2-0 Prolene sutures.  These were clamped and   not tied down yet.    The catheter was tunneled through to the wire exit   site.  The catheter was placed along the wire to determine what length it should be to be in the SVC.  The catheter was cut at 22.5 cm.  The  tunneler sheath and dilator were passed over the wire and the dilator and wire were removed.  The catheter was advanced through the tunneler sheath and the tunneler sheath was pulled away.  Care was taken to keep the catheter in the tunneler sheath as this occurred. This was advanced and the tunneler sheath was removed.  There was good venous   return and easy flush of the catheter.  The Prolene sutures were tied   down to the pectoral fascia.  The skin was reapproximated using 3-0   Vicryl interrupted deep dermal sutures.    Fluoroscopy was used to re-confirm good position of the catheter.  The skin   was then closed using 4-0 Monocryl in a subcuticular fashion.  The port was flushed with concentrated heparin flush as well.  The wounds were then cleaned, dried, and dressed with Dermabond.  The patient was awakened from anesthesia and taken to the PACU in stable condition.  Needle, sponge, and instrument counts were correct.               Kao Berkheimer, MD        

## 2018-04-11 NOTE — H&P (Signed)
Lauren Richard is an 82 y.o. female.   Chief Complaint: pancreatic cancer HPI: Pt presents with pancreatic cancer.  Had up front surgery, but LN positive disease.  Having issues with IV access.    Past Medical History:  Diagnosis Date  . Adenocarcinoma of head of pancreas ([place]) 11/24/2017  . Arthritis    "all over" (04/26/2017); knees, shoulder  . Essential hypertension 03/03/2017  . Family history of breast cancer   . Family history of kidney cancer   . Family history of pancreatic cancer   . Family history of prostate cancer   . History of blood transfusion 1979   "when I had my hysterectomy"  . Hyperlipidemia   . Hypothyroidism   . Obesity 03/03/2017  . Osteoarthritis 03/03/2017  . Pneumonia    as a child    Past Surgical History:  Procedure Laterality Date  . BREAST CYST EXCISION Right    "cut 6 cysts out"  . CYST EXCISION Left    "off my toe"  . DILATION AND CURETTAGE OF UTERUS     "before hysterectomy"  . ERCP N/A 11/04/2017   Procedure: ENDOSCOPIC RETROGRADE CHOLANGIOPANCREATOGRAPHY (ERCP);  Surgeon: Carol Ada, MD;  Location: [facility];  Service: Endoscopy;  Laterality: N/A;  . EUS N/A 11/11/2017   Procedure: UPPER ENDOSCOPIC ULTRASOUND (EUS) LINEAR;  Surgeon: Carol Ada, MD;  Location: [facility];  Service: Endoscopy;  Laterality: N/A;  . FINE NEEDLE ASPIRATION N/A 11/11/2017   Procedure: FINE NEEDLE ASPIRATION (FNA) LINEAR;  Surgeon: Carol Ada, MD;  Location: [facility];  Service: Endoscopy;  Laterality: N/A;  . INGUINAL HERNIA REPAIR Right   . LAPAROSCOPY N/A 01/05/2018   Procedure: LAPAROSCOPY DIAGNOSTIC ERAS PATHWAY;  Surgeon: Stark Klein, MD;  Location: [facility];  Service: General;  Laterality: N/A;  [place]  . REDUCTION MAMMAPLASTY Bilateral 1980  . TONSILLECTOMY     "when I small"  . TOTAL ABDOMINAL HYSTERECTOMY  1979  . TOTAL KNEE ARTHROPLASTY Left 04/26/2017   Procedure: LEFT TOTAL KNEE ARTHROPLASTY;  Surgeon: [name], MD;  Location: [facility];  Service: Orthopedics;  Laterality: Left;  . WHIPPLE PROCEDURE N/A 01/05/2018   Procedure: WHIPPLE PROCEDURE;  Surgeon: Stark Klein, MD;  Location[facility] OR;  Service: General;  Laterality: N/A;    Family History  Problem Relation Age of Onset  . Kidney cancer Mother 42  . Lung cancer Father 26       lung, smoker  . Congestive Heart Failure Sister   . Asthma Sister   . Atrial fibrillation Sister   . Hypertension Sister   . Diabetes Sister   . Head & neck cancer Paternal Grandmother   . Cancer Maternal Uncle        type unk  . Pancreatic cancer Paternal Aunt        dx >50  . Cancer Paternal Uncle        type unk  . Breast cancer Cousin 83  . Prostate cancer Other 49       had robotic surgery   Social History:  reports that she has never smoked. She has never used smokeless tobacco. She reports that she does not drink alcohol or use drugs.  Allergies: No Known Allergies  Medications Prior to Admission  Medication Sig Dispense Refill  . amLODipine (NORVASC) 5 MG tablet Take 5 mg by mouth daily.     Marland Kitchen aspirin 81 MG tablet Take 81 mg by mouth daily.    Marland Kitchen atenolol (TENORMIN) 25 MG tablet  Take 1 tablet (25 mg total) by mouth daily. 30 tablet 0  . Cholecalciferol (VITAMIN D) 2000 units tablet Take 2,000 Units by mouth daily.    . mirtazapine (REMERON) 7.5 MG tablet Take 1 tablet (7.5 mg total) by mouth at bedtime. 30 tablet 1  . nystatin (MYCOSTATIN) 100000 UNIT/ML suspension Take 5 mLs (500,000 Units total) by mouth 4 (four) times daily. (Patient taking differently: Take 5 mLs by mouth 4 (four) times daily as needed (for mouth soreness). ) 100 mL 0  . ondansetron (ZOFRAN) 8 MG tablet Take 8 mg by mouth 2 (two) times daily as needed for nausea/vomiting.  1  . pantoprazole (PROTONIX) 40 MG tablet Take 1 tablet (40 mg total) by mouth daily at 12 noon. 30 tablet 0  . potassium chloride SA (K-DUR,KLOR-CON) 20 MEQ tablet Take 2 tablets (40 mEq total) by mouth daily. (Patient taking  differently: Take 20 mEq by mouth daily. ) 30 tablet 0  . PRESCRIPTION MEDICATION Apply 1 application topically 4 (four) times daily as needed (for back pain). Dic 3%, Bac 2%, lid - pril 2-5%, Menth 1% cream     . prochlorperazine (COMPAZINE) 10 MG tablet Take 10 mg by mouth every 6 (six) hours as needed for nausea or vomiting.    . rivaroxaban (XARELTO) 20 MG TABS tablet Take 1 tablet (20 mg total) by mouth daily with supper. 30 tablet 0  . trolamine salicylate (ASPERCREME) 10 % cream Apply 1 application topically 4 (four) times daily as needed (for knee pain/muscle pain.).     Marland Kitchen Amino Acids-Protein Hydrolys (FEEDING SUPPLEMENT, PRO-STAT SUGAR FREE 64,) LIQD Take 30 mLs by mouth 2 (two) times daily. (Patient not taking: Reported on 03/28/2018) 900 mL 0  . gabapentin (NEURONTIN) 100 MG capsule Take 2 capsules (200 mg total) by mouth 2 (two) times daily. 30 capsule 0  . magnesium oxide (MAG-OX) 400 (241.3 Mg) MG tablet Take 1 tablet (400 mg total) by mouth 2 (two) times daily. (Patient not taking: Reported on 03/28/2018) 60 tablet 0  . traMADol (ULTRAM) 50 MG tablet Take 50 mg by mouth every 12 (twelve) hours as needed (for pain.).      Results for orders placed or performed during the hospital encounter of 04/11/18 (from the past 48 hour(s))  CBC     Status: Abnormal   Collection Time: 04/11/18  6:14 AM  Result Value Ref Range   WBC 13.5 (H) 4.0 - 10.5 K/uL   RBC 3.66 (L) 3.87 - 5.11 MIL/uL   Hemoglobin 11.0 (L) 12.0 - 15.0 g/dL   HCT 34.0 (L) 36.0 - 46.0 %   MCV 92.9 78.0 - 100.0 fL   MCH 30.1 26.0 - 34.0 pg   MCHC 32.4 30.0 - 36.0 g/dL   RDW 19.9 (H) 11.5 - 15.5 %   Platelets 293 150 - 400 K/uL    Comment: Performed at [facility], [place] [address], [place], [place] [address]  Basic metabolic panel     Status: Abnormal   Collection Time: 04/11/18  6:14 AM  Result Value Ref Range   Sodium 136 135 - 145 mmol/L   Potassium 2.5 (LL) 3.5 - 5.1 mmol/L    Comment: CRITICAL RESULT CALLED  TO, READ BACK BY AND VERIFIED WITH: D.ALTMAN,RN 04/11/18 0715 DAVISB    Chloride 102 98 - 111 mmol/L    Comment: Please note change in reference range.   CO2 22 22 - 32 mmol/L   Glucose, Bld 87 70 - 99  mg/dL    Comment: Please note change in reference range.   BUN 6 (L) 8 - 23 mg/dL    Comment: Please note change in reference range.   Creatinine, Ser 0.87 0.44 - 1.00 mg/dL   Calcium 8.3 (L) 8.9 - 10.3 mg/dL   GFR calc non Af Amer >60 >60 mL/min   GFR calc Af Amer >60 >60 mL/min    Comment: (NOTE) The eGFR has been calculated using the CKD EPI equation. This calculation has not been validated in all clinical situations. eGFR's persistently <60 mL/min signify possible Chronic Kidney Disease.    Anion gap 12 5 - 15    Comment: Performed at [facility] [address], [place], [place] [address]   No results found.  Review of Systems  All other systems reviewed and are negative.   Blood pressure 113/64, pulse 81, temperature 97.9 F (36.6 C), resp. rate 18, SpO2 100 %. Physical Exam  Constitutional: She is oriented to person, place, and time. She appears well-developed and well-nourished. No distress.  HENT:  Head: Normocephalic.  Eyes: No scleral icterus.  Cardiovascular: Normal rate.  Respiratory: Effort normal.  GI: Soft.  Musculoskeletal: Normal range of motion.  Neurological: She is alert and oriented to person, place, and time.  Skin: Skin is warm and dry. She is not diaphoretic.  Psychiatric: She has a normal mood and affect. Her behavior is normal. Judgment and thought content normal.     Assessment/Plan Pancreatic cancer, on chemo. Desires port for IV access.   Reviewed risks.   Stark Klein, MD 04/11/2018, 7:32 AM

## 2018-04-11 NOTE — Transfer of Care (Signed)
Immediate Anesthesia Transfer of Care Note  Patient: Lauren Richard  Procedure(s) Performed: INSERTION PORT-A-CATH (Left Chest)  Patient Location: PACU  Anesthesia Type:General  Level of Consciousness: drowsy and patient cooperative  Airway & Oxygen Therapy: Patient Spontanous Breathing and Patient connected to face mask oxygen  Post-op Assessment: Report given to RN and Post -op Vital signs reviewed and stable  Post vital signs: Reviewed and stable  Last Vitals:  Vitals Value Taken Time  BP 102/56 04/11/2018  8:46 AM  Temp    Pulse 71 04/11/2018  8:50 AM  Resp 20 04/11/2018  8:50 AM  SpO2 100 % 04/11/2018  8:50 AM  Vitals shown include unvalidated device data.  Last Pain:  Vitals:   04/11/18 0625  PainSc: 0-No pain         Complications: No apparent anesthesia complications

## 2018-04-12 ENCOUNTER — Encounter (HOSPITAL_COMMUNITY): Payer: Self-pay | Admitting: General Surgery

## 2018-04-14 ENCOUNTER — Encounter: Payer: Self-pay | Admitting: Hematology

## 2018-04-14 ENCOUNTER — Inpatient Hospital Stay: Payer: PPO

## 2018-04-14 ENCOUNTER — Inpatient Hospital Stay: Payer: PPO | Attending: Nurse Practitioner | Admitting: Hematology

## 2018-04-14 ENCOUNTER — Other Ambulatory Visit: Payer: Self-pay

## 2018-04-14 VITALS — BP 116/78 | HR 78 | Temp 97.8°F | Resp 18 | Ht 67.0 in | Wt 174.8 lb

## 2018-04-14 DIAGNOSIS — Z5111 Encounter for antineoplastic chemotherapy: Secondary | ICD-10-CM | POA: Insufficient documentation

## 2018-04-14 DIAGNOSIS — I2510 Atherosclerotic heart disease of native coronary artery without angina pectoris: Secondary | ICD-10-CM | POA: Diagnosis not present

## 2018-04-14 DIAGNOSIS — C250 Malignant neoplasm of head of pancreas: Secondary | ICD-10-CM | POA: Diagnosis not present

## 2018-04-14 DIAGNOSIS — Z7901 Long term (current) use of anticoagulants: Secondary | ICD-10-CM | POA: Insufficient documentation

## 2018-04-14 DIAGNOSIS — R634 Abnormal weight loss: Secondary | ICD-10-CM | POA: Insufficient documentation

## 2018-04-14 DIAGNOSIS — I2699 Other pulmonary embolism without acute cor pulmonale: Secondary | ICD-10-CM | POA: Insufficient documentation

## 2018-04-14 DIAGNOSIS — D709 Neutropenia, unspecified: Secondary | ICD-10-CM | POA: Diagnosis not present

## 2018-04-14 DIAGNOSIS — Z95828 Presence of other vascular implants and grafts: Secondary | ICD-10-CM

## 2018-04-14 DIAGNOSIS — I10 Essential (primary) hypertension: Secondary | ICD-10-CM | POA: Insufficient documentation

## 2018-04-14 DIAGNOSIS — C787 Secondary malignant neoplasm of liver and intrahepatic bile duct: Secondary | ICD-10-CM | POA: Diagnosis not present

## 2018-04-14 DIAGNOSIS — I700 Atherosclerosis of aorta: Secondary | ICD-10-CM | POA: Diagnosis not present

## 2018-04-14 LAB — CBC WITH DIFFERENTIAL (CANCER CENTER ONLY)
Basophils Absolute: 0 10*3/uL (ref 0.0–0.1)
Basophils Relative: 0 %
EOS PCT: 0 %
Eosinophils Absolute: 0 10*3/uL (ref 0.0–0.5)
HCT: 30.1 % — ABNORMAL LOW (ref 34.8–46.6)
Hemoglobin: 9.8 g/dL — ABNORMAL LOW (ref 11.6–15.9)
LYMPHS ABS: 2.7 10*3/uL (ref 0.9–3.3)
LYMPHS PCT: 25 %
MCH: 30.2 pg (ref 25.1–34.0)
MCHC: 32.6 g/dL (ref 31.5–36.0)
MCV: 92.9 fL (ref 79.5–101.0)
MONO ABS: 0.7 10*3/uL (ref 0.1–0.9)
Monocytes Relative: 7 %
Neutro Abs: 7.3 10*3/uL — ABNORMAL HIGH (ref 1.5–6.5)
Neutrophils Relative %: 68 %
PLATELETS: 293 10*3/uL (ref 145–400)
RBC: 3.24 MIL/uL — AB (ref 3.70–5.45)
RDW: 21.7 % — AB (ref 11.2–14.5)
WBC: 10.8 10*3/uL — AB (ref 3.9–10.3)

## 2018-04-14 LAB — CMP (CANCER CENTER ONLY)
ALBUMIN: 2.1 g/dL — AB (ref 3.5–5.0)
ALT: 48 U/L — ABNORMAL HIGH (ref 0–44)
AST: 103 U/L — AB (ref 15–41)
Alkaline Phosphatase: 202 U/L — ABNORMAL HIGH (ref 38–126)
Anion gap: 9 (ref 5–15)
BUN: 11 mg/dL (ref 8–23)
CHLORIDE: 106 mmol/L (ref 98–111)
CO2: 23 mmol/L (ref 22–32)
Calcium: 7.9 mg/dL — ABNORMAL LOW (ref 8.9–10.3)
Creatinine: 0.6 mg/dL (ref 0.44–1.00)
GFR, Est AFR Am: >60 mL/min (ref >60)
GFR, Estimated: >60 mL/min (ref >60)
GLUCOSE: 101 mg/dL — AB (ref 70–99)
POTASSIUM: 2.6 mmol/L — AB (ref 3.5–5.1)
Sodium: 138 mmol/L (ref 135–145)
Total Bilirubin: 1 mg/dL (ref 0.3–1.2)
Total Protein: 5.5 g/dL — ABNORMAL LOW (ref 6.5–8.1)

## 2018-04-14 LAB — IRON AND TIBC
Iron: 62 ug/dL (ref 41–142)
Saturation Ratios: 62 % — ABNORMAL HIGH (ref 21–57)
TIBC: 100 ug/dL — ABNORMAL LOW (ref 236–444)
UIBC: 38 ug/dL

## 2018-04-14 LAB — FERRITIN: FERRITIN: 435 ng/mL — AB (ref 11–307)

## 2018-04-14 MED ORDER — PROCHLORPERAZINE MALEATE 10 MG PO TABS
10.0000 mg | ORAL_TABLET | Freq: Once | ORAL | Status: AC
  Administered 2018-04-14: 10 mg via ORAL

## 2018-04-14 MED ORDER — PROCHLORPERAZINE MALEATE 10 MG PO TABS
ORAL_TABLET | ORAL | Status: AC
  Filled 2018-04-14: qty 1

## 2018-04-14 MED ORDER — POTASSIUM CHLORIDE CRYS ER 20 MEQ PO TBCR: 40.0000 meq | EXTENDED_RELEASE_TABLET | Freq: Once | ORAL | Status: AC

## 2018-04-14 MED ORDER — SODIUM CHLORIDE 0.9 % IV SOLN
800.0000 mg/m2 | Freq: Once | INTRAVENOUS | Status: AC
  Administered 2018-04-14: 1558 mg via INTRAVENOUS
  Filled 2018-04-14: qty 40.98

## 2018-04-14 MED ORDER — LIDOCAINE-PRILOCAINE 2.5-2.5 % EX CREA: 1.0000 "application " | TOPICAL_CREAM | CUTANEOUS | 1 refills | Status: AC | PRN

## 2018-04-14 MED ORDER — SODIUM CHLORIDE 0.9 % IV SOLN
Freq: Once | INTRAVENOUS | Status: AC
  Administered 2018-04-14: 14:00:00 via INTRAVENOUS

## 2018-04-14 MED ORDER — SODIUM CHLORIDE 0.9% FLUSH
10.0000 mL | INTRAVENOUS | Status: DC | PRN
  Administered 2018-04-14: 10 mL via INTRAVENOUS
  Filled 2018-04-14: qty 10

## 2018-04-14 MED ORDER — ALTEPLASE 2 MG IJ SOLR
2.0000 mg | Freq: Once | INTRAMUSCULAR | Status: AC | PRN
  Administered 2018-04-14: 2 mg
  Filled 2018-04-14: qty 2

## 2018-04-14 MED ORDER — POTASSIUM CHLORIDE 10 MEQ/100ML IV SOLN
10.0000 meq | Freq: Once | INTRAVENOUS | Status: AC
  Administered 2018-04-14: 10 meq via INTRAVENOUS
  Filled 2018-04-14: qty 100

## 2018-04-14 MED ORDER — ALTEPLASE 2 MG IJ SOLR
INTRAMUSCULAR | Status: AC
  Filled 2018-04-14: qty 2

## 2018-04-14 MED ORDER — HEPARIN SOD (PORK) LOCK FLUSH 100 UNIT/ML IV SOLN
500.0000 [IU] | Freq: Once | INTRAVENOUS | Status: DC
  Filled 2018-04-14: qty 5

## 2018-04-14 MED ORDER — HEPARIN SOD (PORK) LOCK FLUSH 100 UNIT/ML IV SOLN
500.0000 [IU] | Freq: Once | INTRAVENOUS | Status: AC | PRN
  Administered 2018-04-14: 500 [IU]
  Filled 2018-04-14: qty 5

## 2018-04-14 MED ORDER — SODIUM CHLORIDE 0.9% FLUSH
10.0000 mL | INTRAVENOUS | Status: DC | PRN
  Administered 2018-04-14: 10 mL
  Filled 2018-04-14: qty 10

## 2018-04-14 NOTE — Patient Instructions (Signed)
[facility] Discharge Instructions for Patients Receiving Chemotherapy  Today you received the following chemotherapy agents gemcitabine (Gemzar) and paclitaxel protein-bound (Abraxane)  To help prevent nausea and vomiting after your treatment, we encourage you to take your nausea medication as directed by your doctor.   If you develop nausea and vomiting that is not controlled by your nausea medication, call the clinic.   BELOW ARE SYMPTOMS THAT SHOULD BE REPORTED IMMEDIATELY:  *FEVER GREATER THAN 100.5 F  *CHILLS WITH OR WITHOUT FEVER  NAUSEA AND VOMITING THAT IS NOT CONTROLLED WITH YOUR NAUSEA MEDICATION  *UNUSUAL SHORTNESS OF BREATH  *UNUSUAL BRUISING OR BLEEDING  TENDERNESS IN MOUTH AND THROAT WITH OR WITHOUT PRESENCE OF ULCERS  *URINARY PROBLEMS  *BOWEL PROBLEMS  UNUSUAL RASH Items with * indicate a potential emergency and should be followed up as soon as possible.  Feel free to call the clinic should you have any questions or concerns. The clinic phone number is [phone].  Please show the [facility] at check-in to the Emergency Department and triage nurse.

## 2018-04-14 NOTE — Progress Notes (Signed)
[place]  [phone] [phone]  Clinic Follow Up Note   Patient Care Team: Glendale Chard, MD as PCP - General (Internal Medicine) [name], [facility] as [place] [facility] (Pharmacist)   Date of Service:  04/14/2018  CHIEF COMPLAINTS:  F/u metastatic pancreatic cancer  SUMMARY OF ONCOLOGY HISTORY Cancer Staging Adenocarcinoma of head of pancreas [facility]) Staging form: Exocrine Pancreas, AJCC 8th Edition - Clinical: Stage IB (cT2, cN0, cM0) - Unsigned - Pathologic stage from 01/05/2018: Stage III (pT3, pN2, cM0) - Signed by Truitt Merle, MD on 02/10/2018  Oncology History   Cancer Staging Adenocarcinoma of head of pancreas [facility]) Staging form: Exocrine Pancreas, AJCC 8th Edition - Clinical: Stage IB (cT2, cN0, cM0) - Unsigned - Pathologic stage from 01/05/2018: Stage III (pT3, pN2, cM0) - Signed by Truitt Merle, MD on 02/10/2018       Adenocarcinoma of head of pancreas ([place])   11/03/2017 Imaging    CT AP WO CONTRAST IMPRESSION: 1. Findings are concerning for common bile duct obstruction, potentially related to a primary pancreatic neoplasm, as detailed above. At this time this is associated with moderate to severe intra and extrahepatic biliary ductal dilatation. Further evaluation with MRI of the abdomen with and without IV gadolinium with MRCP should be considered in the near future to better evaluate these findings. Alternatively, if the patient is not likely to be able to adequately hold her breath during an MRI examination, pancreatic protocol CT scan would be recommended. 2. Biliary sludge in the gallbladder. No findings to suggest an acute cholecystitis at this time. 3. Small right adrenal adenoma incidentally noted. 4. Aortic atherosclerosis, in addition to 3 vessel coronary artery disease. Assessment for potential risk factor modification, dietary therapy or pharmacologic therapy may be warranted, if  clinically indicated.  Aortic Atherosclerosis (ICD10-I70.0).       11/04/2017 Procedure    ERCP per Dr. Benson Norway - The major papilla appeared normal. - A segmental biliary stricture was found. The stricture was malignant appearing. - The entire biliary tree was severely dilated, with a mass causing an obstruction. - A biliary sphincterotomy was performed. - One covered metal stent was placed into the common bile duct.      11/04/2017 Imaging    FINDINGS: Imaging demonstrates cannulation of the common bile duct. The common bile duct is dilated. The inferior common bile duct is narrowed. Final image demonstrates placement of a metallic stent across the area of narrowing into the duodenum.  IMPRESSION: Common bile duct stent placement.      11/11/2017 Procedure    EUS per Dr. Benson Norway Endosonographic Finding Findings: A round mass was identified in the pancreatic head. The mass was hypoechoic. The mass measured 30 mm by 22 mm in maximal cross-sectional diameter. The outer margins were irregular. Fine needle aspiration for cytology was performed. Color Doppler imaging was utilized prior to needle puncture to confirm a lack of significant vascular structures within the needle path. Five passes were made with the 25 gauge needle using a transduodenal approach. A stylet was used. A cytotechnologist was present to evaluate the adequacy of the specimen. The cellularity of the specimen was adequate. Final cytology results are pending. The procedure was technically difficult as the metallic stent obscured views of the lesion. It was also very difficult to obtain adequate views for FNA, however, adequate samples were obtained.      11/11/2017 Pathology Results    Diagnosis FINE NEEDLE ASPIRATION, ENDOSCOPIC, PANCREAS HEAD(SPECIMEN 1 OF  1 COLLECTED 11/11/17): ADENOCARCINOMA. Preliminary Diagnosis Intraoperative Diagnosis: SPINDLE CELL FRAGMENTS AND RARE ATYPICAL EPITHELIAL CELLS.(JDP)       11/24/2017 Initial Diagnosis    Adenocarcinoma of head of pancreas (HCC)      12/01/2017 Imaging    CT Abdomen W Contrast 12/01/17 IMPRESSION: 1. 21 x 20 mm pancreatic head/uncinate process lesion without obvious involvement/invasion of the major surrounding vascular structures. 2. Common bile duct stent in place with associated pneumobilia but decompression of the biliary tree. 3. Borderline enlarged celiac axis and periportal lymph nodes. 4. No findings to suggest hepatic metastatic disease. 5. Advanced aortic and branch vessel calcifications but no aneurysm or dissection. 6. Patchy right basilar atelectasis.      01/05/2018 Surgery    LAPAROSCOPY DIAGNOSTIC ERAS PATHWAY and WHIPPLE PROCEDURE by Dr. Barry Dienes and Dr. Johney Maine 01/05/18      01/05/2018 Pathology Results    Diagnosis 01/05/18 1. Gallbladder, and lymph node - CHRONIC CHOLECYSTITIS. - ONE BENIGN PERICYSTIC LYMPH NODE (0/1). - THERE IS NO EVIDENCE OF MALIGNANCY. 2. Lymph node, biopsy, Common Hepatic Artery - THERE IS NO EVIDENCE OF CARCINOMA IN 1 OF 1 LYMPH NODE (0/1). 3. Whipple procedure/resection - INVASIVE ADENOCARCINOMA, MODERATELY DIFFERENTIATED, SPANNING 4.0 CM. - PERINEURAL INVASION IS IDENTIFIED. - LYMPHOVASCULAR INVASION IS IDENTIFIED. - ADENOCARCINOMA IS PRESENT AT THE UNCINATE (RED) MARGIN. - ADENOCARCINOMA EXTENDS INTO PERIPANCREATIC SOFT TISSUE. - METASTATIC CARCINOMA IN 5 OF 29 LYMPH NODES (5/29), WITH EXTRACAPSULAR EXTENSION. - SEE ONCOLOGY TABLE BELOW. 4. Lymph node, biopsy, Portal - THERE IS NO EVIDENCE OF CARCINOMA IN 1 OF 1 LYMPH NODE (0/1). 5. Lymph node, biopsy, Superior mesenteric artery - THERE IS NO EVIDENCE OF CARCINOMA IN 1 OF 1 LYMPH NODE (0/1). 6. Lymph node, biopsy, Ligament of Treitz - THERE IS NO EVIDENCE OF CARCINOMA IN 1 OF 1 LYMPH NODE (0/1)      01/05/2018 - 01/14/2018 Hospital Admission    Admit date: 01/05/18 Admission diagnosis: Fever from infection Additional comments: According  to Discharge summary Pt was admitted to the ICU following and she had an epidural for pain control and low dose fentanyl PCA. Her NGT was able to be pulled on POD 2.5.  She had her foley pulled initially while epidural was still in and this had to be reinserted. She developed a fever, but UA was positive.  Once this was treated, she defervesced.  Culture did not grow anything. Her catheter was pulled after epidural was removed and she did not have any issues.  She had an initial setback when she went from clears to fulls the first time, but n/v resolved.  Her PCA was d/c'd.  She was OOB and worked well with PT.  She had some initial electrolyte abnormalities that resolved.  Her drains were removed without issue.  She had a central line removed without incident.         01/05/2018 Pathology Results    Gallbladder, and lymph node with chronic cholecystitis. One benign pericystic lymph node (0/1). There is no evidence of malignancy. Lymph node, biopsy, Common Hepatic Artery with no evidence of carcinoma in 1 of 1 lymph nodes (0/1). Whipple procedure/resection with invasive adenocarcinoma, moderately differentiated, spanning 4.0 cm. Perineural invasion is identified. Lymphovascular invasion is identified. Adenocarcinoma is present at the uncinate (red) margin. Adenocarcinoma extends into peripancreatic soft tissue. metastatic carcinoma in 5 of 29 lymph nodes (5/29), with extracapsular extension. Lymph node, biopsy, Portal with no evidence of carcinoma in 1 of 1 lymph node (0/1). Lymph node, biopsy, Superior mesenteric  artery with no evidence of carcinoma in 1 of 1 lymph node (0/1). Lymph node, biopsy, Ligament of Treitz with no evidence of carcinoma in 1 of 1 lymph node (0/1).        01/05/2018 Cancer Staging    Staging form: Exocrine Pancreas, AJCC 8th Edition - Pathologic stage from 01/05/2018: Stage III (pT3, pN2, cM0) - Signed by Truitt Merle, MD on 02/10/2018      02/16/2018 -  Chemotherapy    Gemcitabine 3  weeks on and 1 week off for 6 months starting on 02/16/18      03/09/2018 Imaging    CT CAP W contrast 03/09/18  IMPRESSION: 1. Acute pulmonary embolus demonstrated within the distal right main pulmonary artery extending into the right upper, right middle and right lower lobes. 2. Filling defect within the central right internal jugular vein may represent nonocclusive thrombus. 3. Interval development of multiple enhancing lesions within the liver most compatible with hepatic metastatic disease. 4. Interval Whipple procedure. Small cystic lesion at the site of the pancreatic duodenal anastomosis, potentially postprocedural in etiology. Recommend attention on follow-up. 5. Wall thickening and surrounding fat stranding proximal duodenum, potentially postprocedural in etiology. Recommend attention on follow-up. 6. Critical Value/emergent results were called by telephone at the time of interpretation on 03/09/2018 at 2:49 pm to Dr. Truitt Merle , who verbally acknowledged these results.       03/15/2018 -  Chemotherapy    The patient had gemcitabine (GEMZAR) 1,558 mg in sodium chloride 0.9 % 250 mL chemo infusion, 800 mg/m2 = 1,558 mg (80 % of original dose 1,000 mg/m2), Intravenous,  Once, 1 of 4 cycles Dose modification: 800 mg/m2 (original dose 1,000 mg/m2, Cycle 1, Reason: Provider Judgment), 800 mg/m2 (original dose 1,000 mg/m2, Cycle 1, Reason: Provider Judgment) Administration: 1,558 mg (03/16/2018), 1,558 mg (03/23/2018) PACLitaxel-protein bound (ABRAXANE) chemo infusion 200 mg, 100 mg/m2 = 200 mg (100 % of original dose 100 mg/m2), Intravenous, Once, 1 of 1 cycle Dose modification: 100 mg/m2 (original dose 100 mg/m2, Cycle 1, Reason: Provider Judgment, Comment: advanced age ) Administration: 200 mg (03/16/2018), 200 mg (03/23/2018)  for chemotherapy treatment.       HISTORY OF PRESENTING ILLNESS:  QUINNLYN HEARNS 82 y.o. female is here because of newly diagnosed pancreas cancer. She  presented to ED on 10/18/17 complaining of weakness and chest discomfort, she reported a 10-day history of anorexia with intermittent nausea and vomiting.  She was evaluated and found to be in A. Fib with RVR, EKG without ischemia.  CTA negative for PE.  Symptoms improved and she was discharged home on Xarelto and CCB with follow-up at A. fib clinic on 10/24/2017; found to have abnormal TFTs on workup.  She was found to not have A. fib, Xarelto was stopped.  She presented to her PCP for regularly scheduled follow-up on 11/03/2017; with complaints of decreased appetite, 5-10 lbs weight loss, fatigue, and intermittent dizziness.  Had syncopal event there and found to have painless jaundice, was directly sent to ED for further workup, LFTs markedly elevated, T bili in the ED was elevated to 19.7.   During hospitalization she had AKI; had noncontrast CT on 11/03/2017 with findings of a mass like area measuring 2.1 x 2.3 x 3.1 cm in the head of the pancreas immediately below the level of the common bile duct truncation as well as findings concerning for common bile duct obstruction with moderate to severe intra-and extrahepatic biliary ductal dilatation.  No lymphadenopathy noted in the abdomen  or pelvis.  ERCP showed the entire biliary tree to be severely dilated and diffusely dilated with a mass causing the obstruction. Biliary sphincterotomy was performed and a metal stent was successfully placed into the common bile duct.  She improved clinically and LFTs improved, she was discharged home to follow-up with Dr. Benson Norway outpatient with EUS with FNA.  Biopsy confirmed adenocarcinoma of the pancreatic head.  Past medical history is significant for hyperlipidemia, hypertension, arthritis.  Mild anemia found in July 2018, Hgb 10.2 at that time, 9.6 during recent hospitalization.  She has history of oral iron supplementation and received a blood transfusion with hysterectomy in the past. Endocrinology consult is pending for  hyperthyroidism due to altered TFTs; cardizem was stopped and she was started on beta-blocker for tachycardia during hospitalization.  She has positive family history for renal cancer in her mother and lung cancer in her father, he was a smoker.  She has occasional wine socially, never smoked.  She is widowed, did not have children; lives alone, independent of all ADLs, she does not drive but has family close by who drive and support her.  Prior to her illness she exercised at the [facility] twice weekly; remains active in her church community.   Today she feels well, her appetite is returning.  She feels more like her usual self.  Denies pain, nausea.  BMs are regular.  She has mild back pain at her baseline, controlled with Tylenol as needed.  CURRENT THERAPY: Gemcitabine 3 weeks and 1 week off starting 02/16/18. Due to cancer recurrence, added Abraxane to Gemcitabine and switch to 2 weeks on and 1 weeks off started on 03/16/18    INTERVAL HISTORY:  SARYAH LOPER female presents to the office today for follow up.  She developed a severe fatigue, poor appetite, and diarrhea, and was admitted to [facility] for neutropenic fever on 6/19.  She was treated with empiric antibiotics, ID work-up was negative.  She was discharged home on 6/25.  She has been doing home physical therapy.  She feels better overall, still has moderate fatigue, and spent most time in her chair, is able to move around independently, going to the bathroom, kitchen, etc.  She has not gone outside.  She denies significant pain, appetite is still low, she eats small meals.  She has gained 6 pounds since a month ago.  She had a port placed by Dr. Barry Dienes 3 days ago.   MEDICAL HISTORY:  Past Medical History:  Diagnosis Date  . Adenocarcinoma of head of pancreas ([place]) 11/24/2017  . Arthritis    "all over" (04/26/2017); knees, shoulder  . Essential hypertension 03/03/2017  . Family history of breast cancer   . Family history of kidney cancer    . Family history of pancreatic cancer   . Family history of prostate cancer   . History of blood transfusion 1979   "when I had my hysterectomy"  . Hyperlipidemia   . Hypothyroidism   . Obesity 03/03/2017  . Osteoarthritis 03/03/2017  . Pneumonia    as a child    SURGICAL HISTORY: Past Surgical History:  Procedure Laterality Date  . BREAST CYST EXCISION Right    "cut 6 cysts out"  . CYST EXCISION Left    "off my toe"  . DILATION AND CURETTAGE OF UTERUS     "before hysterectomy"  . ERCP N/A 11/04/2017   Procedure: ENDOSCOPIC RETROGRADE CHOLANGIOPANCREATOGRAPHY (ERCP);  Surgeon: Carol Ada, MD;  Location: [place];  Service: Endoscopy;  Laterality: N/A;  . EUS N/A 11/11/2017   Procedure: UPPER ENDOSCOPIC ULTRASOUND (EUS) LINEAR;  Surgeon: Carol Ada, MD;  Location: [facility];  Service: Endoscopy;  Laterality: N/A;  . FINE NEEDLE ASPIRATION N/A 11/11/2017   Procedure: FINE NEEDLE ASPIRATION (FNA) LINEAR;  Surgeon: Carol Ada, MD;  Location: [facility];  Service: Endoscopy;  Laterality: N/A;  . INGUINAL HERNIA REPAIR Right   . LAPAROSCOPY N/A 01/05/2018   Procedure: LAPAROSCOPY DIAGNOSTIC ERAS PATHWAY;  Surgeon: Stark Klein, MD;  Location: [place];  Service: General;  Laterality: N/A;  [place]  . PORTACATH PLACEMENT Left 04/11/2018   Procedure: INSERTION PORT-A-CATH;  Surgeon: Stark Klein, MD;  Location: [place];  Service: General;  Laterality: Left;  . REDUCTION MAMMAPLASTY Bilateral 1980  . TONSILLECTOMY     "when I small"  . TOTAL ABDOMINAL HYSTERECTOMY  1979  . TOTAL KNEE ARTHROPLASTY Left 04/26/2017   Procedure: LEFT TOTAL KNEE ARTHROPLASTY;  Surgeon: [name], MD;  Location: [place];  Service: Orthopedics;  Laterality: Left;  . WHIPPLE PROCEDURE N/A 01/05/2018   Procedure: WHIPPLE PROCEDURE;  Surgeon: Stark Klein, MD;  Location: [place];  Service: General;  Laterality: N/A;    SOCIAL HISTORY: Social History   Socioeconomic History  . Marital status:  Widowed    Spouse name: Not on file  . Number of children: Not on file  . Years of education: Not on file  . Highest education level: Not on file  Occupational History  . Not on file  Social Needs  . Financial resource strain: Not on file  . Food insecurity:    Worry: Not on file    Inability: Not on file  . Transportation needs:    Medical: Not on file    Non-medical: Not on file  Tobacco Use  . Smoking status: Never Smoker  . Smokeless tobacco: Never Used  Substance and Sexual Activity  . Alcohol use: No    Alcohol/week: 0.0 oz    Frequency: Never    Comment: occasional glass of wine   . Drug use: No  . Sexual activity: Never  Lifestyle  . Physical activity:    Days per week: Not on file    Minutes per session: Not on file  . Stress: Not on file  Relationships  . Social connections:    Talks on phone: Not on file    Gets together: Not on file    Attends religious service: Not on file    Active member of club or organization: Not on file    Attends meetings of clubs or organizations: Not on file    Relationship status: Not on file  . Intimate partner violence:    Fear of current or ex partner: Not on file    Emotionally abused: Not on file    Physically abused: Not on file    Forced sexual activity: Not on file  Other Topics Concern  . Not on file  Social History Narrative  . Not on file    FAMILY HISTORY: Family History  Problem Relation Age of Onset  . Kidney cancer Mother 83  . Lung cancer Father 33       lung, smoker  . Congestive Heart Failure Sister   . Asthma Sister   . Atrial fibrillation Sister   . Hypertension Sister   . Diabetes Sister   . Head & neck cancer Paternal Grandmother   . Cancer Maternal Uncle        type unk  .  Pancreatic cancer Paternal Aunt        dx >50  . Cancer Paternal Uncle        type unk  . Breast cancer Cousin 69  . Prostate cancer Other 43       had robotic surgery    ALLERGIES:  has No Known  Allergies.  MEDICATIONS:  Current Outpatient Medications  Medication Sig Dispense Refill  . amLODipine (NORVASC) 5 MG tablet Take 5 mg by mouth daily.     Marland Kitchen aspirin 81 MG tablet Take 81 mg by mouth daily.    Marland Kitchen atenolol (TENORMIN) 25 MG tablet Take 1 tablet (25 mg total) by mouth daily. 30 tablet 0  . Cholecalciferol (VITAMIN D) 2000 units tablet Take 2,000 Units by mouth daily.    Marland Kitchen gabapentin (NEURONTIN) 100 MG capsule Take 2 capsules (200 mg total) by mouth 2 (two) times daily. 30 capsule 0  . ondansetron (ZOFRAN) 8 MG tablet Take 8 mg by mouth 2 (two) times daily as needed for nausea/vomiting.  1  . pantoprazole (PROTONIX) 40 MG tablet Take 1 tablet (40 mg total) by mouth daily at 12 noon. 30 tablet 0  . potassium chloride SA (K-DUR,KLOR-CON) 20 MEQ tablet Take 2 tablets (40 mEq total) by mouth daily. (Patient taking differently: Take 20 mEq by mouth daily. ) 30 tablet 0  . prochlorperazine (COMPAZINE) 10 MG tablet Take 10 mg by mouth every 6 (six) hours as needed for nausea or vomiting.    . rivaroxaban (XARELTO) 20 MG TABS tablet Take 1 tablet (20 mg total) by mouth daily with supper. 30 tablet 0  . traMADol (ULTRAM) 50 MG tablet Take 50 mg by mouth every 12 (twelve) hours as needed (for pain.).    Marland Kitchen lidocaine-prilocaine (EMLA) cream Apply 1 application topically as needed. 30 g 1  . mirtazapine (REMERON) 7.5 MG tablet Take 1 tablet (7.5 mg total) by mouth at bedtime. (Patient not taking: Reported on 04/14/2018) 30 tablet 1  . nystatin (MYCOSTATIN) 100000 UNIT/ML suspension Take 5 mLs (500,000 Units total) by mouth 4 (four) times daily. (Patient not taking: Reported on 04/14/2018) 100 mL 0  . PRESCRIPTION MEDICATION Apply 1 application topically 4 (four) times daily as needed (for back pain). Dic 3%, Bac 2%, lid - pril 2-5%, Menth 1% cream     . trolamine salicylate (ASPERCREME) 10 % cream Apply 1 application topically 4 (four) times daily as needed (for knee pain/muscle pain.).      No  current facility-administered medications for this visit.     REVIEW OF SYSTEMS:  Constitutional: Denies fevers, chills or abnormal night sweats (+) fatigue  Eyes: Denies blurriness of vision, double vision or watery eyes Ears, nose, mouth, throat, and face: Denies mucositis or sore throat Respiratory: Denies cough, dyspnea or wheezes Cardiovascular: Denies palpitation, chest discomfort or lower extremity swelling Gastrointestinal:  Denies constipation, diarrhea, heartburn or change in bowel habits  Skin: Denies abnormal skin rashes   Lymphatics: Denies new lymphadenopathy or easy bruising Neurological:Denies numbness, tingling or new weaknesses Behavioral/Psych: Mood is stable, no new changes  All other systems were reviewed with the patient and are negative.  PHYSICAL EXAMINATION:  ECOG PERFORMANCE STATUS: 1 - Symptomatic but completely ambulatory  Vitals:   04/14/18 1226  BP: 116/78  Pulse: 78  Resp: 18  Temp: 97.8 F (36.6 C)  SpO2: 100%   Filed Weights   04/14/18 1226  Weight: 174 lb 12.8 oz (79.3 kg)    GENERAL:alert, no distress and comfortable  SKIN: skin color, texture, turgor are normal, no rashes or significant lesions, port site is clean. EYES: normal, conjunctiva are pink and non-injected OROPHARYNX:no exudate, no erythema and lips, buccal mucosa, and tongue normal  NECK: supple, thyroid normal size, non-tender, without nodularity LYMPH:  no palpable cervical, supraclavicular, axillary, or inguinal lymphadenopathy  LUNGS: clear to auscultation bilaterally with normal breathing effort HEART: regular rate & rhythm and no murmurs and no lower extremity edema ABDOMEN:abdomen soft, non-tender and normal bowel sounds.  No hepatomegaly. Midline incision from umbilical that is healed well with minimal scar tissues.  Musculoskeletal:no cyanosis of digits and no clubbing, mild bilateral lower extremity edema PSYCH: alert & oriented x 3 with fluent speech NEURO: no focal  motor/sensory deficits  LABORATORY DATA:  I have reviewed the data as listed CBC Latest Ref Rng & Units 04/11/2018 04/04/2018 04/03/2018  WBC 4.0 - 10.5 K/uL 13.5(H) 15.5(H) 16.0(H)  Hemoglobin 12.0 - 15.0 g/dL 11.0(L) 10.0(L) 9.6(L)  Hematocrit 36.0 - 46.0 % 34.0(L) 31.3(L) 29.4(L)  Platelets 150 - 400 K/uL 293 187 186   CMP Latest Ref Rng & Units 04/11/2018 04/04/2018 04/03/2018  Glucose 70 - 99 mg/dL 87 95 82  BUN 8 - 23 mg/dL 6(L) 5(L) 5(L)  Creatinine 0.44 - 1.00 mg/dL 0.87 0.69 0.74  Sodium 135 - 145 mmol/L 136 137 138  Potassium 3.5 - 5.1 mmol/L 2.5(LL) 3.6 3.9  Chloride 98 - 111 mmol/L 102 108 115(H)  CO2 22 - 32 mmol/L 22 23 17(L)  Calcium 8.9 - 10.3 mg/dL 8.3(L) 8.0(L) 8.0(L)  Total Protein 6.5 - 8.1 g/dL - 4.8(L) 4.7(L)  Total Bilirubin 0.3 - 1.2 mg/dL - 0.6 0.7  Alkaline Phos 38 - 126 U/L - 119 106  AST 15 - 41 U/L - 64(H) 61(H)  ALT 0 - 44 U/L - 30 32   PATHOLOGY Diagnosis 11/11/17 FINE NEEDLE ASPIRATION, ENDOSCOPIC, PANCREAS HEAD(SPECIMEN 1 OF 1 COLLECTED 11/11/17): ADENOCARCINOMA. Preliminary Diagnosis Intraoperative Diagnosis: SPINDLE CELL FRAGMENTS AND RARE ATYPICAL EPITHELIAL CELLS.(JDP)  Whipple Procedure, 01/05/2018 1. Gallbladder, and lymph node - CHRONIC CHOLECYSTITIS. - ONE BENIGN PERICYSTIC LYMPH NODE (0/1). - THERE IS NO EVIDENCE OF MALIGNANCY. 2. Lymph node, biopsy, Common Hepatic Artery - THERE IS NO EVIDENCE OF CARCINOMA IN 1 OF 1 LYMPH NODE (0/1). 3. Whipple procedure/resection - INVASIVE ADENOCARCINOMA, MODERATELY DIFFERENTIATED, SPANNING 4.0 CM. - PERINEURAL INVASION IS IDENTIFIED. - LYMPHOVASCULAR INVASION IS IDENTIFIED. - ADENOCARCINOMA IS PRESENT AT THE UNCINATE (RED) MARGIN. - ADENOCARCINOMA EXTENDS INTO PERIPANCREATIC SOFT TISSUE. - METASTATIC CARCINOMA IN 5 OF 29 LYMPH NODES (5/29), WITH EXTRACAPSULAR EXTENSION. - SEE ONCOLOGY TABLE BELOW. 4. Lymph node, biopsy, Portal - THERE IS NO EVIDENCE OF CARCINOMA IN 1 OF 1 LYMPH NODE (0/1). 5. Lymph  node, biopsy, Superior mesenteric artery - THERE IS NO EVIDENCE OF CARCINOMA IN 1 OF 1 LYMPH NODE (0/1). 6. Lymph node, biopsy, Ligament of Treitz - THERE IS NO EVIDENCE OF CARCINOMA IN 1 OF 1 LYMPH NODE (0/1).  RADIOGRAPHIC STUDIES: I have personally reviewed the radiological images as listed and agreed with the findings in the report. Dg Chest Port 1 View  Result Date: 04/11/2018 CLINICAL DATA:  Port-A-Cath insertion EXAM: PORTABLE CHEST 1 VIEW COMPARISON:  04/02/2018 FINDINGS: Power port placed from a left subclavian approach has its tip in the SVC at the azygos level. No pneumothorax. Lungs remain clear except for mild basilar scarring. Chronic aortic atherosclerosis as seen previously. IMPRESSION: Power port placed on the left. Tip in the SVC at the azygos level. No complication.  Electronically Signed   By: Nelson Chimes M.D.   On: 04/11/2018 09:13   Dg Chest Port 1 View  Result Date: 04/02/2018 CLINICAL DATA:  Neutropenic fever EXAM: PORTABLE CHEST 1 VIEW COMPARISON:  01/09/2018 CXR, chest CT 03/09/2018 FINDINGS: The heart size and mediastinal contours are within normal limits. Minimal aortic atherosclerosis with slight uncoiling of the thoracic aorta. No pulmonary consolidation. Minimal bibasilar atelectasis. No significant effusion or pneumothorax. The visualized skeletal structures are unremarkable. IMPRESSION: Bibasilar atelectasis. Aortic atherosclerosis. No acute pulmonary consolidation or CHF. Electronically Signed   By: Ashley Royalty M.D.   On: 04/02/2018 19:21   Dg Fluoro Guide Cv Line-no Report  Result Date: 04/11/2018 Fluoroscopy was utilized by the requesting physician.  No radiographic interpretation.    ASSESSMENT & PLAN: Devonda Pequignot is a pleasant 82 y.o. AAF who presented with syncopal event and obstructive juandice found to have malignant pancreatic head mass.   1. 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 She has what appears to be localized adenocarcinoma on pancreatic head.  -She underwent Whipple surgery, and tolerated surgery well.  She has recovered well from surgery, except not gaining weight back. -I discussed the recent pathology results with the patient and her family member today (02/09/2018).  She has locally advanced disease with 5+ lymph nodes, and positive margin at the uncinate -It is extremely high risk of recurrence, I recommend patient to start adjuvant gemcitabine. -Patient started adjuvant chemotherapy gemcitabine on 02/16/18, tolerated the first cycle well, without significant side effects. -Due to the elevated CA 19.9 tumor marker on 02/16/2018 with results of 198, I advised and recommended that we follow up with a CT scan to rule out recurrence. -We discussed her CT CAP from today 03/09/18 which showed PE in right pulmonary artery spanning to smaller blood vessels. Scan also showed growth in her liver lesions compatible with metastatic disease.  -Due to her advanced age, newly diagnosed PE which requires anticoagulation, and typical image findings and adequate to see 19.9, I think her metastatic disease is pretty certain, we will not pursue biopsy to confirm. -With metastatic disease, her cancer is no longer curable and surgery and targeted radiation are not option for now.  -She started gemcitabine and Abraxane a month ago, did not tolerated well, she ended up in the hospital for neutropenic fever, severe fatigue, diarrhea, after the first cycle. I will stop Abraxane. -She has recovered some from her recent hospitalization, still quite fatigued, not ready for intensive chemotherapy.  We discussed continue chemotherapy versus palliative care alone, she strongly prefers to continue treatment. -Lab reviewed, will proceed gemcitabine alone today. -I will consider GDX  (gemcitabine, docetaxel, and Xeloda) or the combination of two of them if she overall PS improves  -will f/u next week    2. PE in right pulmonary artery extended to smaller blood vessels  -seen on 03/09/18 CT scan, incidental finding, patient is not symptomatic.  No signs of right heart strain on CT. -She has been on Xarelto, tolerating well.  She does not need oxygen. -New Xarelto 20 mg daily.    3. Weight loss, decreased appetite, fatigue  -She has 13 lbs weight loss in 2 months, since symptom onset.  -Patient has lost 16 lbs since her surgery, I previously encouraged her to increase her nutrition supplement -Patient to follow up with  nutritionist, on 02/15/2018. -previously improved, but got worse again after her recent hospital stay.  4. HTN, hyperlipidemia -Managed with PCP -We will monitor her blood pressure closely during her chemotherapy, and adjust her medication if needed  5. Transaminitis  -possible related to chemotherapy -Continue observation and follow-up closely  6. Social Support -Patient lives alone, her niece and nephew will bring her to the office visit and treatment, and support her at home. -Her sisters will rotate staying and taking care of her.   7. Goal of care discussion  -We again discussed the incurable nature of his/her cancer, and the overall poor prognosis, especially if he/she does not have good response to chemotherapy or progress on chemo -The patient understands the goal of care is palliative. -she is full code now   PLAN: -Lab reviewed, adequate for treatment, will proceed with gemcitabine alone today.  Okay to use lab from 3 days ago for treatment, due to her recent neutropenia and severe fatigue, I will not restart Abraxane. -Lab and follow-up next week, will consider adding docetaxel or Xeloda with gemcitabine if her condition overall improved.    No orders of the defined types were placed in this encounter.   All questions were answered.  The patient knows to call the clinic with any problems, questions or concerns. I spent 20 minutes counseling the patient face to face. The total time spent in the appointment was 25 minutes and more than 50% was on counseling and review of test results.    Truitt Merle, MD 04/14/2018

## 2018-04-14 NOTE — Progress Notes (Signed)
Per Dr. Burr Medico alright to treat based on lab results on 04/11/18 relayed to [place]

## 2018-04-15 LAB — CANCER ANTIGEN 19-9: CA 19-9: 60 U/mL — ABNORMAL HIGH (ref 0–35)

## 2018-04-17 ENCOUNTER — Telehealth: Payer: Self-pay | Admitting: Hematology

## 2018-04-17 ENCOUNTER — Other Ambulatory Visit: Payer: Self-pay | Admitting: Pharmacist

## 2018-04-17 ENCOUNTER — Ambulatory Visit: Payer: Self-pay | Admitting: Pharmacist

## 2018-04-17 NOTE — Patient Outreach (Signed)
[facility]) Care Management  04/17/2018  Lauren Richard Mar 22, 1936 [id number]    Patient was called regarding medication review post discharge. Unfortunately, she did not answer either number listed in her chart. HIPAA compliant message was left on her home phone.  The patient's mobile phone voicemail was full.   Plan: Plan patient's case as today's call was the third unsuccessful call.  [name], PharmD, [place] Clinical Pharmacist [phone]

## 2018-04-17 NOTE — Telephone Encounter (Signed)
Appointments scheduled per 7/5 los. OK to add to Dr Ernestina Penna schedule per 7/5 staff message

## 2018-04-18 NOTE — Progress Notes (Signed)
[place]  [phone] [phone]  Clinic Follow Up Note   Patient Care Team: Glendale Chard, MD as PCP - General (Internal Medicine)   Date of Service:  04/20/2018  CHIEF COMPLAINTS:  F/u metastatic pancreatic cancer  SUMMARY OF ONCOLOGY HISTORY Cancer Staging Adenocarcinoma of head of pancreas [facility]) Staging form: Exocrine Pancreas, AJCC 8th Edition - Clinical: Stage IB (cT2, cN0, cM0) - Unsigned - Pathologic stage from 01/05/2018: Stage III (pT3, pN2, cM0) - Signed by Truitt Merle, MD on 02/10/2018  Oncology History   Cancer Staging Adenocarcinoma of head of pancreas [facility]) Staging form: Exocrine Pancreas, AJCC 8th Edition - Clinical: Stage IB (cT2, cN0, cM0) - Unsigned - Pathologic stage from 01/05/2018: Stage III (pT3, pN2, cM0) - Signed by Truitt Merle, MD on 02/10/2018       Adenocarcinoma of head of pancreas ([place])   11/03/2017 Imaging    CT AP WO CONTRAST IMPRESSION: 1. Findings are concerning for common bile duct obstruction, potentially related to a primary pancreatic neoplasm, as detailed above. At this time this is associated with moderate to severe intra and extrahepatic biliary ductal dilatation. Further evaluation with MRI of the abdomen with and without IV gadolinium with MRCP should be considered in the near future to better evaluate these findings. Alternatively, if the patient is not likely to be able to adequately hold her breath during an MRI examination, pancreatic protocol CT scan would be recommended. 2. Biliary sludge in the gallbladder. No findings to suggest an acute cholecystitis at this time. 3. Small right adrenal adenoma incidentally noted. 4. Aortic atherosclerosis, in addition to 3 vessel coronary artery disease. Assessment for potential risk factor modification, dietary therapy or pharmacologic therapy may be warranted, if clinically indicated.  Aortic Atherosclerosis (ICD10-I70.0).       11/04/2017  Procedure    ERCP per Dr. Benson Norway - The major papilla appeared normal. - A segmental biliary stricture was found. The stricture was malignant appearing. - The entire biliary tree was severely dilated, with a mass causing an obstruction. - A biliary sphincterotomy was performed. - One covered metal stent was placed into the common bile duct.      11/04/2017 Imaging    FINDINGS: Imaging demonstrates cannulation of the common bile duct. The common bile duct is dilated. The inferior common bile duct is narrowed. Final image demonstrates placement of a metallic stent across the area of narrowing into the duodenum.  IMPRESSION: Common bile duct stent placement.      11/11/2017 Procedure    EUS per Dr. Benson Norway Endosonographic Finding Findings: A round mass was identified in the pancreatic head. The mass was hypoechoic. The mass measured 30 mm by 22 mm in maximal cross-sectional diameter. The outer margins were irregular. Fine needle aspiration for cytology was performed. Color Doppler imaging was utilized prior to needle puncture to confirm a lack of significant vascular structures within the needle path. Five passes were made with the 25 gauge needle using a transduodenal approach. A stylet was used. A cytotechnologist was present to evaluate the adequacy of the specimen. The cellularity of the specimen was adequate. Final cytology results are pending. The procedure was technically difficult as the metallic stent obscured views of the lesion. It was also very difficult to obtain adequate views for FNA, however, adequate samples were obtained.      11/11/2017 Pathology Results    Diagnosis FINE NEEDLE ASPIRATION, ENDOSCOPIC, PANCREAS HEAD(SPECIMEN 1 OF 1 COLLECTED 11/11/17): ADENOCARCINOMA. Preliminary Diagnosis Intraoperative Diagnosis: SPINDLE CELL FRAGMENTS  AND RARE ATYPICAL EPITHELIAL CELLS.(JDP)      11/24/2017 Initial Diagnosis    Adenocarcinoma of head of pancreas ([place])       12/01/2017 Imaging    CT Abdomen W Contrast 12/01/17 IMPRESSION: 1. 21 x 20 mm pancreatic head/uncinate process lesion without obvious involvement/invasion of the major surrounding vascular structures. 2. Common bile duct stent in place with associated pneumobilia but decompression of the biliary tree. 3. Borderline enlarged celiac axis and periportal lymph nodes. 4. No findings to suggest hepatic metastatic disease. 5. Advanced aortic and branch vessel calcifications but no aneurysm or dissection. 6. Patchy right basilar atelectasis.      01/05/2018 Surgery    LAPAROSCOPY DIAGNOSTIC ERAS PATHWAY and WHIPPLE PROCEDURE by Dr. Barry Dienes and Dr. Johney Maine 01/05/18      01/05/2018 Pathology Results    Diagnosis 01/05/18 1. Gallbladder, and lymph node - CHRONIC CHOLECYSTITIS. - ONE BENIGN PERICYSTIC LYMPH NODE (0/1). - THERE IS NO EVIDENCE OF MALIGNANCY. 2. Lymph node, biopsy, Common Hepatic Artery - THERE IS NO EVIDENCE OF CARCINOMA IN 1 OF 1 LYMPH NODE (0/1). 3. Whipple procedure/resection - INVASIVE ADENOCARCINOMA, MODERATELY DIFFERENTIATED, SPANNING 4.0 CM. - PERINEURAL INVASION IS IDENTIFIED. - LYMPHOVASCULAR INVASION IS IDENTIFIED. - ADENOCARCINOMA IS PRESENT AT THE UNCINATE (RED) MARGIN. - ADENOCARCINOMA EXTENDS INTO PERIPANCREATIC SOFT TISSUE. - METASTATIC CARCINOMA IN 5 OF 29 LYMPH NODES (5/29), WITH EXTRACAPSULAR EXTENSION. - SEE ONCOLOGY TABLE BELOW. 4. Lymph node, biopsy, Portal - THERE IS NO EVIDENCE OF CARCINOMA IN 1 OF 1 LYMPH NODE (0/1). 5. Lymph node, biopsy, Superior mesenteric artery - THERE IS NO EVIDENCE OF CARCINOMA IN 1 OF 1 LYMPH NODE (0/1). 6. Lymph node, biopsy, Ligament of Treitz - THERE IS NO EVIDENCE OF CARCINOMA IN 1 OF 1 LYMPH NODE (0/1)      01/05/2018 - 01/14/2018 Hospital Admission    Admit date: 01/05/18 Admission diagnosis: Fever from infection Additional comments: According to Discharge summary Pt was admitted to the ICU following and she had an epidural  for pain control and low dose fentanyl PCA. Her NGT was able to be pulled on POD 2.5.  She had her foley pulled initially while epidural was still in and this had to be reinserted. She developed a fever, but UA was positive.  Once this was treated, she defervesced.  Culture did not grow anything. Her catheter was pulled after epidural was removed and she did not have any issues.  She had an initial setback when she went from clears to fulls the first time, but n/v resolved.  Her PCA was d/c'd.  She was OOB and worked well with PT.  She had some initial electrolyte abnormalities that resolved.  Her drains were removed without issue.  She had a central line removed without incident.         01/05/2018 Pathology Results    Gallbladder, and lymph node with chronic cholecystitis. One benign pericystic lymph node (0/1). There is no evidence of malignancy. Lymph node, biopsy, Common Hepatic Artery with no evidence of carcinoma in 1 of 1 lymph nodes (0/1). Whipple procedure/resection with invasive adenocarcinoma, moderately differentiated, spanning 4.0 cm. Perineural invasion is identified. Lymphovascular invasion is identified. Adenocarcinoma is present at the uncinate (red) margin. Adenocarcinoma extends into peripancreatic soft tissue. metastatic carcinoma in 5 of 29 lymph nodes (5/29), with extracapsular extension. Lymph node, biopsy, Portal with no evidence of carcinoma in 1 of 1 lymph node (0/1). Lymph node, biopsy, Superior mesenteric artery with no evidence of carcinoma in 1 of 1 lymph  node (0/1). Lymph node, biopsy, Ligament of Treitz with no evidence of carcinoma in 1 of 1 lymph node (0/1).        01/05/2018 Cancer Staging    Staging form: Exocrine Pancreas, AJCC 8th Edition - Pathologic stage from 01/05/2018: Stage III (pT3, pN2, cM0) - Signed by Truitt Merle, MD on 02/10/2018      02/16/2018 - 03/02/2018 Chemotherapy    Gemcitabine 3 weeks on and 1 week off for 6 months starting on 02/16/18 and stopped after 3  weeks due to disease progression.       03/09/2018 Imaging    CT CAP W contrast 03/09/18  IMPRESSION: 1. Acute pulmonary embolus demonstrated within the distal right main pulmonary artery extending into the right upper, right middle and right lower lobes. 2. Filling defect within the central right internal jugular vein may represent nonocclusive thrombus. 3. Interval development of multiple enhancing lesions within the liver most compatible with hepatic metastatic disease. 4. Interval Whipple procedure. Small cystic lesion at the site of the pancreatic duodenal anastomosis, potentially postprocedural in etiology. Recommend attention on follow-up. 5. Wall thickening and surrounding fat stranding proximal duodenum, potentially postprocedural in etiology. Recommend attention on follow-up. 6. Critical Value/emergent results were called by telephone at the time of interpretation on 03/09/2018 at 2:49 pm to Dr. Truitt Merle , who verbally acknowledged these results.       03/16/2018 -  Chemotherapy    Gemcitabine and Abraxane 2 weeks on and 1 week off starting 03/16/18. Due to poor toleration with neutropenic fever, Abraxane was discontinued after 1 cycle.       HISTORY OF PRESENTING ILLNESS:  Lauren Richard 82 y.o. female is here because of newly diagnosed pancreas cancer. She presented to ED on 10/18/17 complaining of weakness and chest discomfort, she reported a 10-day history of anorexia with intermittent nausea and vomiting.  She was evaluated and found to be in A. Fib with RVR, EKG without ischemia.  CTA negative for PE.  Symptoms improved and she was discharged home on Xarelto and CCB with follow-up at A. fib clinic on 10/24/2017; found to have abnormal TFTs on workup.  She was found to not have A. fib, Xarelto was stopped.  She presented to her PCP for regularly scheduled follow-up on 11/03/2017; with complaints of decreased appetite, 5-10 lbs weight loss, fatigue, and intermittent dizziness.   Had syncopal event there and found to have painless jaundice, was directly sent to ED for further workup, LFTs markedly elevated, T bili in the ED was elevated to 19.7.   During hospitalization she had AKI; had noncontrast CT on 11/03/2017 with findings of a mass like area measuring 2.1 x 2.3 x 3.1 cm in the head of the pancreas immediately below the level of the common bile duct truncation as well as findings concerning for common bile duct obstruction with moderate to severe intra-and extrahepatic biliary ductal dilatation.  No lymphadenopathy noted in the abdomen or pelvis.  ERCP showed the entire biliary tree to be severely dilated and diffusely dilated with a mass causing the obstruction. Biliary sphincterotomy was performed and a metal stent was successfully placed into the common bile duct.  She improved clinically and LFTs improved, she was discharged home to follow-up with Dr. Benson Norway outpatient with EUS with FNA.  Biopsy confirmed adenocarcinoma of the pancreatic head.  Past medical history is significant for hyperlipidemia, hypertension, arthritis.  Mild anemia found in July 2018, Hgb 10.2 at that time, 9.6 during recent hospitalization.  She  has history of oral iron supplementation and received a blood transfusion with hysterectomy in the past. Endocrinology consult is pending for hyperthyroidism due to altered TFTs; cardizem was stopped and she was started on beta-blocker for tachycardia during hospitalization.  She has positive family history for renal cancer in her mother and lung cancer in her father, he was a smoker.  She has occasional wine socially, never smoked.  She is widowed, did not have children; lives alone, independent of all ADLs, she does not drive but has family close by who drive and support her.  Prior to her illness she exercised at the [facility] twice weekly; remains active in her church community.   Today she feels well, her appetite is returning.  She feels more like her usual self.   Denies pain, nausea.  BMs are regular.  She has mild back pain at her baseline, controlled with Tylenol as needed.  CURRENT THERAPY: Gemcitabine 3 weeks and 1 week off starting 02/16/18-03/02/18. Due to cancer recurrence, added Abraxane to Gemcitabine and switch to 2 weeks on and 1 weeks off started on 03/16/18. Due to poor toleration with neutropenic fever, Abraxane was discontinued after 1 cycle. Plan to add Xeloda to weekly gemcitabine in the next 2 weeks.    INTERVAL HISTORY:   Lauren Richard female presents to the office today for follow up and treatment. She presents to the clinic today accompanied by her family member. She notes she tolerated treatment well last week and overall better than last month. She notes she has no appetite and has lost weight since her last treatment. She notes she sleeps well overall. She notes she was prescribed mirtazapine but has not used it.  She notes she almost fell this morning leaving the house according to her family member. She notes she is taking her potasium pill twice daily.     MEDICAL HISTORY:  Past Medical History:  Diagnosis Date  . Adenocarcinoma of head of pancreas ([place]) 11/24/2017  . Arthritis    "all over" (04/26/2017); knees, shoulder  . Essential hypertension 03/03/2017  . Family history of breast cancer   . Family history of kidney cancer   . Family history of pancreatic cancer   . Family history of prostate cancer   . History of blood transfusion 1979   "when I had my hysterectomy"  . Hyperlipidemia   . Hypothyroidism   . Obesity 03/03/2017  . Osteoarthritis 03/03/2017  . Pneumonia    as a child    SURGICAL HISTORY: Past Surgical History:  Procedure Laterality Date  . BREAST CYST EXCISION Right    "cut 6 cysts out"  . CYST EXCISION Left    "off my toe"  . DILATION AND CURETTAGE OF UTERUS     "before hysterectomy"  . ERCP N/A 11/04/2017   Procedure: ENDOSCOPIC RETROGRADE CHOLANGIOPANCREATOGRAPHY (ERCP);  Surgeon: Carol Ada,  MD;  Location: [place];  Service: Endoscopy;  Laterality: N/A;  . EUS N/A 11/11/2017   Procedure: UPPER ENDOSCOPIC ULTRASOUND (EUS) LINEAR;  Surgeon: Carol Ada, MD;  Location: [facility];  Service: Endoscopy;  Laterality: N/A;  . FINE NEEDLE ASPIRATION N/A 11/11/2017   Procedure: FINE NEEDLE ASPIRATION (FNA) LINEAR;  Surgeon: Carol Ada, MD;  Location: [facility];  Service: Endoscopy;  Laterality: N/A;  . INGUINAL HERNIA REPAIR Right   . LAPAROSCOPY N/A 01/05/2018   Procedure: LAPAROSCOPY DIAGNOSTIC ERAS PATHWAY;  Surgeon: Stark Klein, MD;  Location: [place];  Service: General;  Laterality: N/A;  [place]  . PORTACATH  PLACEMENT Left 04/11/2018   Procedure: INSERTION PORT-A-CATH;  Surgeon: Stark Klein, MD;  Location: [place];  Service: General;  Laterality: Left;  . REDUCTION MAMMAPLASTY Bilateral 1980  . TONSILLECTOMY     "when I small"  . TOTAL ABDOMINAL HYSTERECTOMY  1979  . TOTAL KNEE ARTHROPLASTY Left 04/26/2017   Procedure: LEFT TOTAL KNEE ARTHROPLASTY;  Surgeon: [name], MD;  Location: [place];  Service: Orthopedics;  Laterality: Left;  . WHIPPLE PROCEDURE N/A 01/05/2018   Procedure: WHIPPLE PROCEDURE;  Surgeon: Stark Klein, MD;  Location: [place];  Service: General;  Laterality: N/A;    SOCIAL HISTORY: Social History   Socioeconomic History  . Marital status: Widowed    Spouse name: Not on file  . Number of children: Not on file  . Years of education: Not on file  . Highest education level: Not on file  Occupational History  . Not on file  Social Needs  . Financial resource strain: Not on file  . Food insecurity:    Worry: Not on file    Inability: Not on file  . Transportation needs:    Medical: Not on file    Non-medical: Not on file  Tobacco Use  . Smoking status: Never Smoker  . Smokeless tobacco: Never Used  Substance and Sexual Activity  . Alcohol use: No    Alcohol/week: 0.0 oz    Frequency: Never    Comment: occasional glass of wine   .  Drug use: No  . Sexual activity: Never  Lifestyle  . Physical activity:    Days per week: Not on file    Minutes per session: Not on file  . Stress: Not on file  Relationships  . Social connections:    Talks on phone: Not on file    Gets together: Not on file    Attends religious service: Not on file    Active member of club or organization: Not on file    Attends meetings of clubs or organizations: Not on file    Relationship status: Not on file  . Intimate partner violence:    Fear of current or ex partner: Not on file    Emotionally abused: Not on file    Physically abused: Not on file    Forced sexual activity: Not on file  Other Topics Concern  . Not on file  Social History Narrative  . Not on file    FAMILY HISTORY: Family History  Problem Relation Age of Onset  . Kidney cancer Mother 13  . Lung cancer Father 72       lung, smoker  . Congestive Heart Failure Sister   . Asthma Sister   . Atrial fibrillation Sister   . Hypertension Sister   . Diabetes Sister   . Head & neck cancer Paternal Grandmother   . Cancer Maternal Uncle        type unk  . Pancreatic cancer Paternal Aunt        dx >50  . Cancer Paternal Uncle        type unk  . Breast cancer Cousin 5  . Prostate cancer Other 74       had robotic surgery    ALLERGIES:  has No Known Allergies.  MEDICATIONS:  Current Outpatient Medications  Medication Sig Dispense Refill  . amLODipine (NORVASC) 5 MG tablet Take 5 mg by mouth daily.     Marland Kitchen aspirin 81 MG tablet Take 81 mg by mouth daily.    Marland Kitchen  atenolol (TENORMIN) 25 MG tablet Take 1 tablet (25 mg total) by mouth daily. 30 tablet 0  . Cholecalciferol (VITAMIN D) 2000 units tablet Take 2,000 Units by mouth daily.    Marland Kitchen gabapentin (NEURONTIN) 100 MG capsule Take 2 capsules (200 mg total) by mouth 2 (two) times daily. 30 capsule 0  . lidocaine-prilocaine (EMLA) cream Apply 1 application topically as needed. 30 g 1  . mirtazapine (REMERON) 7.5 MG tablet Take  1 tablet (7.5 mg total) by mouth at bedtime. 30 tablet 1  . nystatin (MYCOSTATIN) 100000 UNIT/ML suspension Take 5 mLs (500,000 Units total) by mouth 4 (four) times daily. 100 mL 0  . ondansetron (ZOFRAN) 8 MG tablet Take 8 mg by mouth 2 (two) times daily as needed for nausea/vomiting.  1  . pantoprazole (PROTONIX) 40 MG tablet Take 1 tablet (40 mg total) by mouth daily at 12 noon. 30 tablet 0  . potassium chloride SA (K-DUR,KLOR-CON) 20 MEQ tablet Take 2 tablets (40 mEq total) by mouth daily. 60 tablet 2  . PRESCRIPTION MEDICATION Apply 1 application topically 4 (four) times daily as needed (for back pain). Dic 3%, Bac 2%, lid - pril 2-5%, Menth 1% cream     . prochlorperazine (COMPAZINE) 10 MG tablet Take 10 mg by mouth every 6 (six) hours as needed for nausea or vomiting.    . rivaroxaban (XARELTO) 20 MG TABS tablet Take 1 tablet (20 mg total) by mouth daily with supper. 30 tablet 0  . traMADol (ULTRAM) 50 MG tablet Take 50 mg by mouth every 12 (twelve) hours as needed (for pain.).    Marland Kitchen trolamine salicylate (ASPERCREME) 10 % cream Apply 1 application topically 4 (four) times daily as needed (for knee pain/muscle pain.).     Marland Kitchen capecitabine (XELODA) 500 MG tablet Take 2 tab twice daily for day 1-14, every 21 days 56 tablet 0   No current facility-administered medications for this visit.    Facility-Administered Medications Ordered in Other Visits  Medication Dose Route Frequency Provider Last Rate Last Dose  . potassium chloride SA (K-DUR,KLOR-CON) CR tablet 40 mEq  40 mEq Oral Once Truitt Merle, MD        REVIEW OF SYSTEMS:  Constitutional: Denies fevers, chills or abnormal night sweats (+) no appetite with moderate weight loss Eyes: Denies blurriness of vision, double vision or watery eyes Ears, nose, mouth, throat, and face: Denies mucositis or sore throat Respiratory: Denies cough, dyspnea or wheezes Cardiovascular: Denies palpitation, chest discomfort or lower extremity  swelling Gastrointestinal:  Denies constipation, diarrhea, heartburn or change in bowel habits  Skin: Denies abnormal skin rashes   Lymphatics: Denies new lymphadenopathy or easy bruising Neurological:Denies numbness, tingling or new weaknesses Behavioral/Psych: Mood is stable, no new changes  All other systems were reviewed with the patient and are negative.  PHYSICAL EXAMINATION:  ECOG PERFORMANCE STATUS: 1 - Symptomatic but completely ambulatory  Vitals:   04/20/18 0905  BP: 123/78  Pulse: (!) 108  Resp: 20  Temp: 98.3 F (36.8 C)  SpO2: 100%   Filed Weights   04/20/18 0905  Weight: 165 lb (74.8 kg)    GENERAL:alert, no distress and comfortable SKIN: skin color, texture, turgor are normal, no rashes or significant lesions, port site is clean. EYES: normal, conjunctiva are pink and non-injected OROPHARYNX:no exudate, no erythema and lips, buccal mucosa, and tongue normal  NECK: supple, thyroid normal size, non-tender, without nodularity LYMPH:  no palpable cervical, supraclavicular, axillary, or inguinal lymphadenopathy  LUNGS: clear to auscultation  bilaterally with normal breathing effort HEART: regular rate & rhythm and no murmurs and no lower extremity edema ABDOMEN:abdomen soft, non-tender and normal bowel sounds.  No hepatomegaly. Midline incision from umbilical that is healed well with minimal scar tissues.  Musculoskeletal:no cyanosis of digits and no clubbing, mild bilateral lower extremity edema PSYCH: alert & oriented x 3 with fluent speech NEURO: no focal motor/sensory deficits  LABORATORY DATA:  I have reviewed the data as listed CBC Latest Ref Rng & Units 04/20/2018 04/14/2018 04/11/2018  WBC 3.9 - 10.3 K/uL 4.0 10.8(H) 13.5(H)  Hemoglobin 11.6 - 15.9 g/dL 8.3(L) 9.8(L) 11.0(L)  Hematocrit 34.8 - 46.6 % 24.9(L) 30.1(L) 34.0(L)  Platelets 145 - 400 K/uL 228 293 293   CMP Latest Ref Rng & Units 04/20/2018 04/14/2018 04/11/2018  Glucose 70 - 99 mg/dL 92 101(H) 87   BUN 8 - 23 mg/dL 7(L) 11 6(L)  Creatinine 0.44 - 1.00 mg/dL 0.69 0.60 0.87  Sodium 135 - 145 mmol/L 139 138 136  Potassium 3.5 - 5.1 mmol/L 3.3(L) 2.6(LL) 2.5(LL)  Chloride 98 - 111 mmol/L 109 106 102  CO2 22 - 32 mmol/L 23 23 22   Calcium 8.9 - 10.3 mg/dL 8.7(L) 7.9(L) 8.3(L)  Total Protein 6.5 - 8.1 g/dL 6.7 5.5(L) -  Total Bilirubin 0.3 - 1.2 mg/dL 0.7 1.0 -  Alkaline Phos 38 - 126 U/L 170(H) 202(H) -  AST 15 - 41 U/L 91(H) 103(H) -  ALT 0 - 44 U/L 46(H) 48(H) -   PATHOLOGY Diagnosis 11/11/17 FINE NEEDLE ASPIRATION, ENDOSCOPIC, PANCREAS HEAD(SPECIMEN 1 OF 1 COLLECTED 11/11/17): ADENOCARCINOMA. Preliminary Diagnosis Intraoperative Diagnosis: SPINDLE CELL FRAGMENTS AND RARE ATYPICAL EPITHELIAL CELLS.(JDP)  Whipple Procedure, 01/05/2018 1. Gallbladder, and lymph node - CHRONIC CHOLECYSTITIS. - ONE BENIGN PERICYSTIC LYMPH NODE (0/1). - THERE IS NO EVIDENCE OF MALIGNANCY. 2. Lymph node, biopsy, Common Hepatic Artery - THERE IS NO EVIDENCE OF CARCINOMA IN 1 OF 1 LYMPH NODE (0/1). 3. Whipple procedure/resection - INVASIVE ADENOCARCINOMA, MODERATELY DIFFERENTIATED, SPANNING 4.0 CM. - PERINEURAL INVASION IS IDENTIFIED. - LYMPHOVASCULAR INVASION IS IDENTIFIED. - ADENOCARCINOMA IS PRESENT AT THE UNCINATE (RED) MARGIN. - ADENOCARCINOMA EXTENDS INTO PERIPANCREATIC SOFT TISSUE. - METASTATIC CARCINOMA IN 5 OF 29 LYMPH NODES (5/29), WITH EXTRACAPSULAR EXTENSION. - SEE ONCOLOGY TABLE BELOW. 4. Lymph node, biopsy, Portal - THERE IS NO EVIDENCE OF CARCINOMA IN 1 OF 1 LYMPH NODE (0/1). 5. Lymph node, biopsy, Superior mesenteric artery - THERE IS NO EVIDENCE OF CARCINOMA IN 1 OF 1 LYMPH NODE (0/1). 6. Lymph node, biopsy, Ligament of Treitz - THERE IS NO EVIDENCE OF CARCINOMA IN 1 OF 1 LYMPH NODE (0/1).  RADIOGRAPHIC STUDIES: I have personally reviewed the radiological images as listed and agreed with the findings in the report. Dg Chest Port 1 View  Result Date: 04/11/2018 CLINICAL DATA:   Port-A-Cath insertion EXAM: PORTABLE CHEST 1 VIEW COMPARISON:  04/02/2018 FINDINGS: Power port placed from a left subclavian approach has its tip in the SVC at the azygos level. No pneumothorax. Lungs remain clear except for mild basilar scarring. Chronic aortic atherosclerosis as seen previously. IMPRESSION: Power port placed on the left. Tip in the SVC at the azygos level. No complication. Electronically Signed   By: Nelson Chimes M.D.   On: 04/11/2018 09:13   Dg Chest Port 1 View  Result Date: 04/02/2018 CLINICAL DATA:  Neutropenic fever EXAM: PORTABLE CHEST 1 VIEW COMPARISON:  01/09/2018 CXR, chest CT 03/09/2018 FINDINGS: The heart size and mediastinal contours are within normal limits. Minimal aortic atherosclerosis with slight  uncoiling of the thoracic aorta. No pulmonary consolidation. Minimal bibasilar atelectasis. No significant effusion or pneumothorax. The visualized skeletal structures are unremarkable. IMPRESSION: Bibasilar atelectasis. Aortic atherosclerosis. No acute pulmonary consolidation or CHF. Electronically Signed   By: Ashley Royalty M.D.   On: 04/02/2018 19:21   Dg Fluoro Guide Cv Line-no Report  Result Date: 04/11/2018 Fluoroscopy was utilized by the requesting physician.  No radiographic interpretation.    ASSESSMENT & PLAN: Lauren Richard is a pleasant 82 y.o. AAF who presented with syncopal event and obstructive juandice found to have malignant pancreatic head mass.   1. 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 She has what appears to be localized adenocarcinoma on pancreatic head.  -She underwent Whipple surgery on 01/05/18. She has recovered well from surgery, except not gaining weight back. -I previously discussed the recent pathology results with the patient and her family member.   She has locally advanced disease with 5+ lymph nodes, and positive margin at the uncinate -It is extremely high risk of recurrence, I recommended patient to start adjuvant gemcitabine. -Patient started single agent adjuvant chemotherapy gemcitabine 3 weeks on and 1 week off 02/16/18-03/02/18, tolerated well, without significant side effects. -Due to the elevated CA 19.9 tumor marker on 02/16/2018 with results of 198. -We previously discussed her CT CAP from 03/09/18 which showed PE in right pulmonary artery spanning to smaller blood vessels. Scan also showed growth in her liver lesions compatible with metastatic disease.  -Due to her advanced age, newly diagnosed PE which requires anticoagulation, and typical image findings and adequate to see 19.9, I think her metastatic disease is pretty certain, we will not pursue biopsy to confirm. -With metastatic disease, her cancer is no longer curable and surgery and targeted radiation are not option for now.  -She started Abraxane along with Gemcitabine for 2 weeks on 1 week off on 03/16/18 and pt did not tolerated well, she ended up in the hospital for neutropenic fever, severe fatigue, diarrhea, after the first cycle. Abraxane was discontinued.  -We previously discussed continue chemotherapy versus palliative care alone, she strongly prefers to continue treatment.   -She started cycle 2 gemcitabine alone on 04/14/18 and tolerated better overall and her performance status continues to recover post hospitalization. I recommend adding Xeloda to gemcitabine from next week, if she tolerate well, may add small dose docetaxel -Labs reviewed, Her potassium level is still pending, will continue oral K BID for now. Hg dropped to 8.3, I discussed if this continues to drop I would recommend a blood transfusion if her Hb<8.0 next week. She understands.  -F/u in 1 week, to start next cycle chemo with gemcitaibne 800mg /m2 on day 1, 8, and Xeloda 1000mg  bid day 1-14, every 21  days   2. PE in right pulmonary artery extended to smaller blood vessels  -seen on 03/09/18 CT scan, incidental finding, patient is not symptomatic. No signs of right heart strain on CT. -She has been on Xarelto, tolerating well. She does not need oxygen. -Continue Xarelto 20 mg daily.    3. Weight loss, decreased appetite, fatigue  -She previously had a 13 lbs weight loss in 2 months, since symptom onset and then an additional 16 lbs weight loss since her surgery -Patient to follow up with nutritionist as needed, last f/u on 02/15/2018. -She continued to have no appetite  and moderate weight loss. I recommend she eat several small meals/snacks a day along with boost and increase water intake. Will monitor.  -I recommend she start her Mirtazapine prescription once daily for now and if tolerable she can increase to BID.    4. HTN, hyperlipidemia -Managed with PCP -We will monitor her blood pressure closely during her chemotherapy, and adjust her medication if needed  5. Transaminitis  -possible related to chemotherapy -Continue observation and follow-up closely  6. Social Support -Patient lives alone, her niece and nephew will bring her to the office visit and treatment, and support her at home. -Her sisters will rotate staying and taking care of her.   7. Goal of care discussion  -We again discussed the incurable nature of his/her cancer, and the overall poor prognosis, especially if he/she does not have good response to chemotherapy or progress on chemo -The patient understands the goal of care is palliative. -she is full code now   PLAN: -I refilled her oral potassium today  -No Gemcitabine infusion today  -Lab, flush and chemo gemcitabine in 1 and 2 weeks -I prescribe Xeloda 1000mg  bid for day 1-14 every 21 days, starting in one week with gemcitabine     Orders Placed This Encounter  Procedures  . Sample to Blood Bank    Standing Status:   Future    Standing Expiration  Date:   04/21/2019    All questions were answered. The patient knows to call the clinic with any problems, questions or concerns. I spent 20 minutes counseling the patient face to face. The total time spent in the appointment was 25 minutes and more than 50% was on counseling and review of test results.  Oneal Deputy, am acting as scribe for Truitt Merle, MD.   I have reviewed the above documentation for accuracy and completeness, and I agree with the above.     Truitt Merle, MD 04/20/2018

## 2018-04-20 ENCOUNTER — Ambulatory Visit: Payer: PPO | Admitting: Hematology

## 2018-04-20 ENCOUNTER — Inpatient Hospital Stay (HOSPITAL_BASED_OUTPATIENT_CLINIC_OR_DEPARTMENT_OTHER): Payer: PPO | Admitting: Hematology

## 2018-04-20 ENCOUNTER — Inpatient Hospital Stay: Payer: PPO

## 2018-04-20 ENCOUNTER — Other Ambulatory Visit: Payer: PPO

## 2018-04-20 ENCOUNTER — Encounter: Payer: Self-pay | Admitting: Hematology

## 2018-04-20 ENCOUNTER — Telehealth: Payer: Self-pay | Admitting: Hematology

## 2018-04-20 ENCOUNTER — Ambulatory Visit: Payer: PPO | Admitting: Nurse Practitioner

## 2018-04-20 VITALS — BP 123/78 | HR 108 | Temp 98.3°F | Resp 20 | Wt 165.0 lb

## 2018-04-20 DIAGNOSIS — C250 Malignant neoplasm of head of pancreas: Secondary | ICD-10-CM

## 2018-04-20 DIAGNOSIS — I10 Essential (primary) hypertension: Secondary | ICD-10-CM | POA: Diagnosis not present

## 2018-04-20 DIAGNOSIS — C787 Secondary malignant neoplasm of liver and intrahepatic bile duct: Secondary | ICD-10-CM

## 2018-04-20 DIAGNOSIS — I2699 Other pulmonary embolism without acute cor pulmonale: Secondary | ICD-10-CM

## 2018-04-20 DIAGNOSIS — Z5111 Encounter for antineoplastic chemotherapy: Secondary | ICD-10-CM | POA: Diagnosis not present

## 2018-04-20 DIAGNOSIS — R634 Abnormal weight loss: Secondary | ICD-10-CM

## 2018-04-20 LAB — CBC WITH DIFFERENTIAL (CANCER CENTER ONLY)
Basophils Absolute: 0 10*3/uL (ref 0.0–0.1)
Basophils Relative: 1 %
Eosinophils Absolute: 0 10*3/uL (ref 0.0–0.5)
Eosinophils Relative: 1 %
HEMATOCRIT: 24.9 % — AB (ref 34.8–46.6)
HEMOGLOBIN: 8.3 g/dL — AB (ref 11.6–15.9)
LYMPHS ABS: 1.2 10*3/uL (ref 0.9–3.3)
LYMPHS PCT: 31 %
MCH: 32.2 pg (ref 25.1–34.0)
MCHC: 33.5 g/dL (ref 31.5–36.0)
MCV: 96.1 fL (ref 79.5–101.0)
MONO ABS: 0.4 10*3/uL (ref 0.1–0.9)
MONOS PCT: 10 %
NEUTROS ABS: 2.3 10*3/uL (ref 1.5–6.5)
Neutrophils Relative %: 57 %
Platelet Count: 228 10*3/uL (ref 145–400)
RBC: 2.59 MIL/uL — ABNORMAL LOW (ref 3.70–5.45)
RDW: 23.2 % — AB (ref 11.2–14.5)
WBC Count: 4 10*3/uL (ref 3.9–10.3)

## 2018-04-20 LAB — CMP (CANCER CENTER ONLY)
ALK PHOS: 170 U/L — AB (ref 38–126)
ALT: 46 U/L — ABNORMAL HIGH (ref 0–44)
ANION GAP: 7 (ref 5–15)
AST: 91 U/L — ABNORMAL HIGH (ref 15–41)
Albumin: 2.4 g/dL — ABNORMAL LOW (ref 3.5–5.0)
BUN: 7 mg/dL — ABNORMAL LOW (ref 8–23)
CALCIUM: 8.7 mg/dL — AB (ref 8.9–10.3)
CHLORIDE: 109 mmol/L (ref 98–111)
CO2: 23 mmol/L (ref 22–32)
Creatinine: 0.69 mg/dL (ref 0.44–1.00)
GFR, Est AFR Am: >60 mL/min (ref >60)
GFR, Estimated: >60 mL/min (ref >60)
GLUCOSE: 92 mg/dL (ref 70–99)
POTASSIUM: 3.3 mmol/L — AB (ref 3.5–5.1)
SODIUM: 139 mmol/L (ref 135–145)
Total Bilirubin: 0.7 mg/dL (ref 0.3–1.2)
Total Protein: 6.7 g/dL (ref 6.5–8.1)

## 2018-04-20 MED ORDER — CAPECITABINE 500 MG PO TABS: ORAL_TABLET | ORAL | 0 refills | Status: DC

## 2018-04-20 MED ORDER — POTASSIUM CHLORIDE CRYS ER 20 MEQ PO TBCR: 40.0000 meq | EXTENDED_RELEASE_TABLET | Freq: Every day | ORAL | 2 refills | Status: DC

## 2018-04-20 NOTE — Progress Notes (Signed)
Port-A-Cath education completed. Patient provided with written information on care of Port and use of Emla cream. Patient and nephew voiced understanding.

## 2018-04-20 NOTE — Telephone Encounter (Signed)
Scheduled appt per 7/11 los - gave patient aVS and calender per los. - gave patient and son two copies.

## 2018-04-21 ENCOUNTER — Telehealth: Payer: Self-pay | Admitting: Pharmacist

## 2018-04-21 DIAGNOSIS — C250 Malignant neoplasm of head of pancreas: Secondary | ICD-10-CM

## 2018-04-21 MED ORDER — CAPECITABINE 500 MG PO TABS: ORAL_TABLET | ORAL | 0 refills | Status: DC

## 2018-04-21 NOTE — Telephone Encounter (Signed)
Oral Oncology Pharmacist Encounter  Received new prescription for Xeloda (capecitabine) for the maintenance treatment of metastatic pancreatic adenocarcinoma in conjunction with infusional gemcitabine, planned duration until disease progression to unacceptable toxicity. Incidental diagnosis with stage III disease in Feb 2019, pt is s/p resection on 01/05/18 Patient started on adjuvant monotherapy with gemcitabine in May 2019, Abraxane was later added due to evidence of disease progression. Abraxane has now been discontinued secondary to neutropenic fever. Xeloda is planned to be added to infusional gemcitabine given day 1 and 8 of each 28 day cycle.  Labs from 04/20/18 assessed, OK for treatment. LFTs elevated, they will continue to be monitored. Xeloda dosed at ~530 mg/m2 BID for increased tolerance  Current medication list in Epic reviewed, no significant DDIs with Xeloda identified. Continued need for proton pump inhibitor therapy will be addressed with patient at initial counseling.  Prescription has been e-scribed to the [facility] for benefits analysis and approval.  Oral Oncology Clinic will continue to follow for insurance authorization, copayment issues, initial counseling and start date.  Johny Drilling, PharmD, BCPS, BCOP  04/21/2018 9:01 AM Oral Oncology Clinic [phone]

## 2018-04-22 ENCOUNTER — Other Ambulatory Visit: Payer: Self-pay

## 2018-04-22 ENCOUNTER — Encounter (HOSPITAL_COMMUNITY): Payer: Self-pay

## 2018-04-22 ENCOUNTER — Emergency Department (HOSPITAL_COMMUNITY): Payer: PPO

## 2018-04-22 ENCOUNTER — Inpatient Hospital Stay (HOSPITAL_COMMUNITY)
Admission: EM | Admit: 2018-04-22 | Discharge: 2018-04-25 | DRG: 312 | Disposition: A | Discharge status: Home Health | Payer: PPO | Attending: Family Medicine | Admitting: Family Medicine

## 2018-04-22 DIAGNOSIS — Z6823 Body mass index (BMI) 23.0-23.9, adult: Secondary | ICD-10-CM

## 2018-04-22 DIAGNOSIS — C799 Secondary malignant neoplasm of unspecified site: Secondary | ICD-10-CM | POA: Diagnosis not present

## 2018-04-22 DIAGNOSIS — I351 Nonrheumatic aortic (valve) insufficiency: Secondary | ICD-10-CM | POA: Diagnosis not present

## 2018-04-22 DIAGNOSIS — Z8051 Family history of malignant neoplasm of kidney: Secondary | ICD-10-CM

## 2018-04-22 DIAGNOSIS — C250 Malignant neoplasm of head of pancreas: Secondary | ICD-10-CM | POA: Diagnosis present

## 2018-04-22 DIAGNOSIS — Z7901 Long term (current) use of anticoagulants: Secondary | ICD-10-CM

## 2018-04-22 DIAGNOSIS — N189 Chronic kidney disease, unspecified: Secondary | ICD-10-CM | POA: Diagnosis not present

## 2018-04-22 DIAGNOSIS — D62 Acute posthemorrhagic anemia: Secondary | ICD-10-CM | POA: Diagnosis present

## 2018-04-22 DIAGNOSIS — Z808 Family history of malignant neoplasm of other organs or systems: Secondary | ICD-10-CM

## 2018-04-22 DIAGNOSIS — K922 Gastrointestinal hemorrhage, unspecified: Secondary | ICD-10-CM | POA: Diagnosis present

## 2018-04-22 DIAGNOSIS — E039 Hypothyroidism, unspecified: Secondary | ICD-10-CM | POA: Diagnosis present

## 2018-04-22 DIAGNOSIS — Z803 Family history of malignant neoplasm of breast: Secondary | ICD-10-CM | POA: Diagnosis not present

## 2018-04-22 DIAGNOSIS — E785 Hyperlipidemia, unspecified: Secondary | ICD-10-CM | POA: Diagnosis present

## 2018-04-22 DIAGNOSIS — D6481 Anemia due to antineoplastic chemotherapy: Secondary | ICD-10-CM | POA: Diagnosis present

## 2018-04-22 DIAGNOSIS — Z800 Family history of malignant neoplasm of digestive organs: Secondary | ICD-10-CM | POA: Diagnosis not present

## 2018-04-22 DIAGNOSIS — Z96652 Presence of left artificial knee joint: Secondary | ICD-10-CM | POA: Diagnosis present

## 2018-04-22 DIAGNOSIS — Z801 Family history of malignant neoplasm of trachea, bronchus and lung: Secondary | ICD-10-CM | POA: Diagnosis not present

## 2018-04-22 DIAGNOSIS — I361 Nonrheumatic tricuspid (valve) insufficiency: Secondary | ICD-10-CM | POA: Diagnosis not present

## 2018-04-22 DIAGNOSIS — Z79899 Other long term (current) drug therapy: Secondary | ICD-10-CM

## 2018-04-22 DIAGNOSIS — C787 Secondary malignant neoplasm of liver and intrahepatic bile duct: Secondary | ICD-10-CM | POA: Diagnosis present

## 2018-04-22 DIAGNOSIS — E440 Moderate protein-calorie malnutrition: Secondary | ICD-10-CM | POA: Diagnosis present

## 2018-04-22 DIAGNOSIS — I10 Essential (primary) hypertension: Secondary | ICD-10-CM | POA: Diagnosis present

## 2018-04-22 DIAGNOSIS — R195 Other fecal abnormalities: Secondary | ICD-10-CM

## 2018-04-22 DIAGNOSIS — I951 Orthostatic hypotension: Principal | ICD-10-CM | POA: Diagnosis present

## 2018-04-22 DIAGNOSIS — Z8042 Family history of malignant neoplasm of prostate: Secondary | ICD-10-CM | POA: Diagnosis not present

## 2018-04-22 DIAGNOSIS — Z8249 Family history of ischemic heart disease and other diseases of the circulatory system: Secondary | ICD-10-CM

## 2018-04-22 DIAGNOSIS — E860 Dehydration: Secondary | ICD-10-CM | POA: Diagnosis present

## 2018-04-22 DIAGNOSIS — I129 Hypertensive chronic kidney disease with stage 1 through stage 4 chronic kidney disease, or unspecified chronic kidney disease: Secondary | ICD-10-CM | POA: Diagnosis not present

## 2018-04-22 DIAGNOSIS — E870 Hyperosmolality and hypernatremia: Secondary | ICD-10-CM | POA: Diagnosis present

## 2018-04-22 DIAGNOSIS — R569 Unspecified convulsions: Secondary | ICD-10-CM | POA: Diagnosis not present

## 2018-04-22 DIAGNOSIS — E876 Hypokalemia: Secondary | ICD-10-CM | POA: Diagnosis present

## 2018-04-22 DIAGNOSIS — Z90710 Acquired absence of both cervix and uterus: Secondary | ICD-10-CM

## 2018-04-22 DIAGNOSIS — R404 Transient alteration of awareness: Secondary | ICD-10-CM | POA: Diagnosis not present

## 2018-04-22 DIAGNOSIS — C259 Malignant neoplasm of pancreas, unspecified: Secondary | ICD-10-CM

## 2018-04-22 DIAGNOSIS — I2699 Other pulmonary embolism without acute cor pulmonale: Secondary | ICD-10-CM | POA: Diagnosis present

## 2018-04-22 DIAGNOSIS — T451X5A Adverse effect of antineoplastic and immunosuppressive drugs, initial encounter: Secondary | ICD-10-CM | POA: Diagnosis present

## 2018-04-22 DIAGNOSIS — I959 Hypotension, unspecified: Secondary | ICD-10-CM | POA: Diagnosis not present

## 2018-04-22 DIAGNOSIS — Z90411 Acquired partial absence of pancreas: Secondary | ICD-10-CM | POA: Diagnosis not present

## 2018-04-22 DIAGNOSIS — R55 Syncope and collapse: Secondary | ICD-10-CM | POA: Diagnosis not present

## 2018-04-22 DIAGNOSIS — D649 Anemia, unspecified: Secondary | ICD-10-CM | POA: Diagnosis not present

## 2018-04-22 LAB — CBG MONITORING, ED: GLUCOSE-CAPILLARY: 98 mg/dL (ref 70–99)

## 2018-04-22 LAB — URINALYSIS, ROUTINE W REFLEX MICROSCOPIC
Bilirubin Urine: NEGATIVE
Glucose, UA: NEGATIVE mg/dL
HGB URINE DIPSTICK: NEGATIVE
Ketones, ur: NEGATIVE mg/dL
Leukocytes, UA: NEGATIVE
Nitrite: NEGATIVE
Protein, ur: NEGATIVE mg/dL
Specific Gravity, Urine: 1.005 (ref 1.005–1.030)
pH: 8 (ref 5.0–8.0)

## 2018-04-22 LAB — CBC
HCT: 25.3 % — ABNORMAL LOW (ref 36.0–46.0)
Hemoglobin: 8.4 g/dL — ABNORMAL LOW (ref 12.0–15.0)
MCH: 32.2 pg (ref 26.0–34.0)
MCHC: 33.2 g/dL (ref 30.0–36.0)
MCV: 96.9 fL (ref 78.0–100.0)
Platelets: 200 10*3/uL (ref 150–400)
RBC: 2.61 MIL/uL — AB (ref 3.87–5.11)
RDW: 21.4 % — ABNORMAL HIGH (ref 11.5–15.5)
WBC: 4.9 10*3/uL (ref 4.0–10.5)

## 2018-04-22 LAB — I-STAT TROPONIN, ED: Troponin i, poc: 0 ng/mL (ref 0.00–0.08)

## 2018-04-22 LAB — BASIC METABOLIC PANEL
ANION GAP: 7 (ref 5–15)
BUN: 5 mg/dL — ABNORMAL LOW (ref 8–23)
CALCIUM: 8.1 mg/dL — AB (ref 8.9–10.3)
CHLORIDE: 110 mmol/L (ref 98–111)
CO2: 24 mmol/L (ref 22–32)
Creatinine, Ser: 0.57 mg/dL (ref 0.44–1.00)
GFR calc non Af Amer: >60 mL/min (ref >60)
Glucose, Bld: 104 mg/dL — ABNORMAL HIGH (ref 70–99)
POTASSIUM: 2.9 mmol/L — AB (ref 3.5–5.1)
Sodium: 141 mmol/L (ref 135–145)

## 2018-04-22 LAB — POC OCCULT BLOOD, ED: Fecal Occult Bld: POSITIVE — AB

## 2018-04-22 LAB — MAGNESIUM: MAGNESIUM: 1.4 mg/dL — AB (ref 1.7–2.4)

## 2018-04-22 MED ORDER — ACETAMINOPHEN 325 MG PO TABS
650.0000 mg | ORAL_TABLET | Freq: Four times a day (QID) | ORAL | Status: DC | PRN
  Filled 2018-04-22: qty 2

## 2018-04-22 MED ORDER — ONDANSETRON HCL 4 MG PO TABS: 4.0000 mg | ORAL_TABLET | Freq: Four times a day (QID) | ORAL | Status: DC | PRN

## 2018-04-22 MED ORDER — TRAMADOL HCL 50 MG PO TABS: 50.0000 mg | ORAL_TABLET | Freq: Two times a day (BID) | ORAL | Status: DC | PRN

## 2018-04-22 MED ORDER — RIVAROXABAN 20 MG PO TABS: 20.0000 mg | ORAL_TABLET | Freq: Every day | ORAL | Status: DC

## 2018-04-22 MED ORDER — SODIUM CHLORIDE 0.9% FLUSH
3.0000 mL | Freq: Two times a day (BID) | INTRAVENOUS | Status: DC
  Administered 2018-04-24: 3 mL via INTRAVENOUS

## 2018-04-22 MED ORDER — ACETAMINOPHEN 650 MG RE SUPP: 650.0000 mg | Freq: Four times a day (QID) | RECTAL | Status: DC | PRN

## 2018-04-22 MED ORDER — ONDANSETRON HCL 4 MG/2ML IJ SOLN
4.0000 mg | Freq: Four times a day (QID) | INTRAMUSCULAR | Status: DC | PRN
  Administered 2018-04-25: 4 mg via INTRAVENOUS
  Filled 2018-04-22: qty 2

## 2018-04-22 MED ORDER — KCL IN DEXTROSE-NACL 40-5-0.9 MEQ/L-%-% IV SOLN
INTRAVENOUS | Status: DC
  Administered 2018-04-22 – 2018-04-23 (×2): via INTRAVENOUS
  Filled 2018-04-22 (×3): qty 1000

## 2018-04-22 MED ORDER — MIRTAZAPINE 15 MG PO TABS: 7.5000 mg | ORAL_TABLET | Freq: Every day | ORAL | Status: DC

## 2018-04-22 MED ORDER — POTASSIUM CHLORIDE CRYS ER 20 MEQ PO TBCR
40.0000 meq | EXTENDED_RELEASE_TABLET | ORAL | Status: AC
  Administered 2018-04-22 (×2): 40 meq via ORAL
  Filled 2018-04-22 (×2): qty 2

## 2018-04-22 MED ORDER — SODIUM CHLORIDE 0.9 % IV SOLN: 250.0000 mL | INTRAVENOUS | Status: DC | PRN

## 2018-04-22 MED ORDER — PANTOPRAZOLE SODIUM 40 MG PO TBEC
40.0000 mg | DELAYED_RELEASE_TABLET | Freq: Every day | ORAL | Status: DC
Start: 2018-04-22 — End: 2018-04-25
  Administered 2018-04-22 – 2018-04-25 (×4): 40 mg via ORAL
  Filled 2018-04-22 (×4): qty 1

## 2018-04-22 MED ORDER — MIRTAZAPINE 15 MG PO TABS
15.0000 mg | ORAL_TABLET | Freq: Every day | ORAL | Status: DC
  Administered 2018-04-22 – 2018-04-24 (×3): 15 mg via ORAL
  Filled 2018-04-22 (×3): qty 1

## 2018-04-22 MED ORDER — PROCHLORPERAZINE MALEATE 10 MG PO TABS: 10.0000 mg | ORAL_TABLET | Freq: Four times a day (QID) | ORAL | Status: DC | PRN

## 2018-04-22 MED ORDER — SODIUM CHLORIDE 0.9 % IV BOLUS
1000.0000 mL | Freq: Once | INTRAVENOUS | Status: AC
  Administered 2018-04-22: 1000 mL via INTRAVENOUS

## 2018-04-22 MED ORDER — NYSTATIN 100000 UNIT/ML MT SUSP
5.0000 mL | Freq: Four times a day (QID) | OROMUCOSAL | Status: DC
  Administered 2018-04-22 – 2018-04-25 (×8): 500000 [IU] via ORAL
  Filled 2018-04-22 (×9): qty 5

## 2018-04-22 MED ORDER — ENSURE ENLIVE PO LIQD
237.0000 mL | Freq: Two times a day (BID) | ORAL | Status: DC
  Administered 2018-04-24 – 2018-04-25 (×2): 237 mL via ORAL

## 2018-04-22 MED ORDER — MEGESTROL ACETATE 400 MG/10ML PO SUSP: 400.0000 mg | Freq: Two times a day (BID) | ORAL | Status: DC

## 2018-04-22 MED ORDER — CAPECITABINE 500 MG PO TABS: 1000.0000 mg/m2 | ORAL_TABLET | Freq: Two times a day (BID) | ORAL | Status: DC

## 2018-04-22 MED ORDER — SODIUM CHLORIDE 0.9% FLUSH: 3.0000 mL | INTRAVENOUS | Status: DC | PRN

## 2018-04-22 NOTE — ED Provider Notes (Signed)
[facility] DEPT Provider Note   CSN: [id number] Arrival date & time: 04/22/18  1358     History   Chief Complaint Chief Complaint  Patient presents with  . Near Syncope    HPI Lauren Richard is a 82 y.o. female.  HPI History of stage IV pancreatic cancer, hypertension, hyperlipidemia had several episodes of passing out.  Family members were with the patient to help her bathe.  They were trying to go into the bathroom when she suddenly became unresponsive and could not talk or stand.  Her family member reports that they had to "slap her" to try to awaken her.  Family member reports that she went through several episodes of looking like her arms were convulsing or moving.  She then was poorly responsive and seemed to be coming around and then had another episode of the same.  Is occurred 3 times.  EMS arrived and reports patient's blood pressure was 70 systolic on arrival.  On route she returned to normal mental status.  Family members report patient did not any time strike her head.  Patient denies she has any focal area weakness or numbness in arms or legs.  She denies focal pain.  She has been generally weak.  She has been undergoing treatment for stage IV pancreatic cancer with chemotherapy.  Denies black or bloody appearing stool.  Reports lower extremely swelling has decreased and improved since her discharge from the hospital.  No increased shortness of breath as compared to previously.  No fevers. Past Medical History:  Diagnosis Date  . Adenocarcinoma of head of pancreas ([place]) 11/24/2017  . Arthritis    "all over" (04/26/2017); knees, shoulder  . Essential hypertension 03/03/2017  . Family history of breast cancer   . Family history of kidney cancer   . Family history of pancreatic cancer   . Family history of prostate cancer   . History of blood transfusion 1979   "when I had my hysterectomy"  . Hyperlipidemia   . Hypothyroidism   . Obesity  03/03/2017  . Osteoarthritis 03/03/2017  . Pneumonia    as a child    Patient Active Problem List   Diagnosis Date Noted  . Generalized weakness 03/29/2018  . Nausea & vomiting 03/29/2018  . Diarrhea 03/29/2018  . Chemotherapy induced neutropenia ([place]) 03/23/2018  . Family history of pancreatic cancer   . Family history of breast cancer   . Family history of kidney cancer   . Family history of prostate cancer   . Goals of care, counseling/discussion 03/09/2018  . Acute pulmonary embolism ([place]) 03/09/2018  . S/P laparoscopy 01/05/2018  . Adenocarcinoma of head of pancreas ([place]) 11/24/2017  . Acute kidney injury superimposed on chronic kidney disease ([place])   . Hypokalemia   . Anemia   . Biliary obstruction   . Acute kidney injury ([place]) 11/03/2017  . Pancreatic mass 11/03/2017  . Osteoarthritis of left knee 04/26/2017  . S/P total knee replacement using cement, left 04/26/2017  . Essential hypertension 03/03/2017  . Hyperlipidemia 03/03/2017  . Osteoarthritis 03/03/2017  . Obesity 03/03/2017  . Status post left foot surgery 12/03/2015  . Hammertoe 11/03/2015    Past Surgical History:  Procedure Laterality Date  . BREAST CYST EXCISION Right    "cut 6 cysts out"  . CYST EXCISION Left    "off my toe"  . DILATION AND CURETTAGE OF UTERUS     "before hysterectomy"  . ERCP N/A 11/04/2017   Procedure: ENDOSCOPIC  RETROGRADE CHOLANGIOPANCREATOGRAPHY (ERCP);  Surgeon: Carol Ada, MD;  Location: [facility];  Service: Endoscopy;  Laterality: N/A;  . EUS N/A 11/11/2017   Procedure: UPPER ENDOSCOPIC ULTRASOUND (EUS) LINEAR;  Surgeon: Carol Ada, MD;  Location: [facility];  Service: Endoscopy;  Laterality: N/A;  . FINE NEEDLE ASPIRATION N/A 11/11/2017   Procedure: FINE NEEDLE ASPIRATION (FNA) LINEAR;  Surgeon: Carol Ada, MD;  Location: [facility];  Service: Endoscopy;  Laterality: N/A;  . INGUINAL HERNIA REPAIR Right   . LAPAROSCOPY N/A 01/05/2018   Procedure: LAPAROSCOPY  DIAGNOSTIC ERAS PATHWAY;  Surgeon: Stark Klein, MD;  Location: [facility];  Service: General;  Laterality: N/A;  [place]  . PORTACATH PLACEMENT Left 04/11/2018   Procedure: INSERTION PORT-A-CATH;  Surgeon: Stark Klein, MD;  Location: [facility];  Service: General;  Laterality: Left;  . REDUCTION MAMMAPLASTY Bilateral 1980  . TONSILLECTOMY     "when I small"  . TOTAL ABDOMINAL HYSTERECTOMY  1979  . TOTAL KNEE ARTHROPLASTY Left 04/26/2017   Procedure: LEFT TOTAL KNEE ARTHROPLASTY;  Surgeon: [name], MD;  Location: [facility];  Service: Orthopedics;  Laterality: Left;  . WHIPPLE PROCEDURE N/A 01/05/2018   Procedure: WHIPPLE PROCEDURE;  Surgeon: Stark Klein, MD;  Location: [facility];  Service: General;  Laterality: N/A;     OB History   None      Home Medications    Prior to Admission medications   Medication Sig Start Date End Date Taking? Authorizing Provider  amLODipine (NORVASC) 5 MG tablet Take 5 mg by mouth daily.  08/12/16  Yes [provider]  atenolol (TENORMIN) 25 MG tablet Take 1 tablet (25 mg total) by mouth daily. 11/07/17  Yes Hongalgi, Lenis Dickinson, MD  capecitabine (XELODA) 500 MG tablet Take 2 tablets (1000mg ) by mouth 2 times daily, within 30 min of finishing food. Take on days 1-14, of each 21 day cycle 04/21/18  Yes Truitt Merle, MD  Cholecalciferol (VITAMIN D) 2000 units tablet Take 2,000 Units by mouth daily.   Yes [provider]  gabapentin (NEURONTIN) 100 MG capsule Take 2 capsules (200 mg total) by mouth 2 (two) times daily. 01/13/18  Yes Stark Klein, MD  lidocaine-prilocaine (EMLA) cream Apply 1 application topically as needed. 04/14/18  Yes Truitt Merle, MD  mirtazapine (REMERON) 7.5 MG tablet Take 1 tablet (7.5 mg total) by mouth at bedtime. 03/21/18  Yes Alla Feeling, NP  nystatin (MYCOSTATIN) 100000 UNIT/ML suspension Take 5 mLs (500,000 Units total) by mouth 4 (four) times daily. 03/24/18  Yes Alla Feeling, NP  ondansetron (ZOFRAN) 8 MG tablet Take 8 mg by  mouth 2 (two) times daily as needed for nausea/vomiting. 03/23/18  Yes [provider]  pantoprazole (PROTONIX) 40 MG tablet Take 1 tablet (40 mg total) by mouth daily at 12 noon. 01/13/18  Yes Stark Klein, MD  potassium chloride SA (K-DUR,KLOR-CON) 20 MEQ tablet Take 2 tablets (40 mEq total) by mouth daily. 04/20/18  Yes Truitt Merle, MD  PRESCRIPTION MEDICATION Apply 1 application topically 4 (four) times daily as needed (for back pain). Dic 3%, Bac 2%, lid - pril 2-5%, Menth 1% cream    Yes [provider]  prochlorperazine (COMPAZINE) 10 MG tablet Take 10 mg by mouth every 6 (six) hours as needed for nausea or vomiting.   Yes [provider]  rivaroxaban (XARELTO) 20 MG TABS tablet Take 1 tablet (20 mg total) by mouth daily with supper. 04/04/18  Yes Sheikh, Omair Latif, DO  trolamine salicylate (ASPERCREME) 10 %  cream Apply 1 application topically 4 (four) times daily as needed (for knee pain/muscle pain.).    Yes [provider]  traMADol (ULTRAM) 50 MG tablet Take 50 mg by mouth every 12 (twelve) hours as needed (for pain.).    [provider]    Family History Family History  Problem Relation Age of Onset  . Kidney cancer Mother 64  . Lung cancer Father 41       lung, smoker  . Congestive Heart Failure Sister   . Asthma Sister   . Atrial fibrillation Sister   . Hypertension Sister   . Diabetes Sister   . Head & neck cancer Paternal Grandmother   . Cancer Maternal Uncle        type unk  . Pancreatic cancer Paternal Aunt        dx >50  . Cancer Paternal Uncle        type unk  . Breast cancer Cousin 41  . Prostate cancer Other 62       had robotic surgery    Social History Social History   Tobacco Use  . Smoking status: Never Smoker  . Smokeless tobacco: Never Used  Substance Use Topics  . Alcohol use: No    Alcohol/week: 0.0 oz    Frequency: Never    Comment: occasional glass of wine   . Drug use: No     Allergies   Patient  has no known allergies.   Review of Systems Review of Systems 10 Systems reviewed and are negative for acute change except as noted in the HPI.  Physical Exam Updated Vital Signs BP 109/67   Pulse 70   Temp 97.8 F (36.6 C) (Oral)   Resp 16   Ht 5\' 7"  (1.702 m)   Wt 66.7 kg (147 lb)   SpO2 100%   BMI 23.02 kg/m   Physical Exam  Constitutional: She is oriented to person, place, and time. She appears well-developed and well-nourished.  HENT:  Head: Normocephalic and atraumatic.  Mouth/Throat: Oropharynx is clear and moist.  Eyes: Pupils are equal, round, and reactive to light. EOM are normal.  Neck: Neck supple.  Cardiovascular: Normal rate, regular rhythm, normal heart sounds and intact distal pulses.  Pulmonary/Chest: Effort normal and breath sounds normal.  Abdominal: Soft. Bowel sounds are normal. She exhibits no distension. There is no tenderness.  Genitourinary:  Genitourinary Comments: Formed stool in the vault that is brown-yellow.  Patient has one nonthrombosed but large hemorrhoid at the anal opening.  Musculoskeletal: Normal range of motion. She exhibits no edema or tenderness.  Neurological: She is alert and oriented to person, place, and time. She has normal strength. No cranial nerve deficit. She exhibits normal muscle tone. Coordination normal. GCS eye subscore is 4. GCS verbal subscore is 5. GCS motor subscore is 6.  Skin: Skin is warm, dry and intact.  Psychiatric: She has a normal mood and affect.     ED Treatments / Results  Labs (all labs ordered are listed, but only abnormal results are displayed) Labs Reviewed  BASIC METABOLIC PANEL - Abnormal; Notable for the following components:      Result Value   Potassium 2.9 (*)    Glucose, Bld 104 (*)    BUN 5 (*)    Calcium 8.1 (*)    All other components within normal limits  CBC - Abnormal; Notable for the following components:   RBC 2.61 (*)    Hemoglobin 8.4 (*)  HCT 25.3 (*)    RDW 21.4 (*)     All other components within normal limits  POC OCCULT BLOOD, ED - Abnormal; Notable for the following components:   Fecal Occult Bld POSITIVE (*)    All other components within normal limits  URINALYSIS, ROUTINE W REFLEX MICROSCOPIC  CBG MONITORING, ED  I-STAT TROPONIN, ED    EKG EKG Interpretation  Date/Time:  Saturday April 22 2018 14:43:19 EDT Ventricular Rate:  68 PR Interval:    QRS Duration: 86 QT Interval:  386 QTC Calculation: 411 R Axis:   7 Text Interpretation:  Sinus rhythm Baseline wander in lead(s) I II aVR no STEMI, NO CHANGE FROM PREVIOUS Confirmed by Charlesetta Shanks [phone]) on 04/22/2018 4:24:34 PM   Radiology Ct Head Wo Contrast  Result Date: 04/22/2018 CLINICAL DATA:  82 year old female with history of cancer presenting with near syncopal episode. EXAM: CT HEAD WITHOUT CONTRAST TECHNIQUE: Contiguous axial images were obtained from the base of the skull through the vertex without intravenous contrast. COMPARISON:  None. FINDINGS: Brain: No evidence of acute infarction, hemorrhage, hydrocephalus, extra-axial collection or mass lesion/mass effect. Vascular: No hyperdense vessel or unexpected calcification. Skull: Normal. Negative for fracture or focal lesion. Sinuses/Orbits: No acute finding. Other: None. IMPRESSION: 1. No acute intracranial abnormalities. The appearance of the brain is normal. Electronically Signed   By: Vinnie Langton M.D.   On: 04/22/2018 15:52    Procedures Procedures (including critical care time)  Medications Ordered in ED Medications - No data to display   Initial Impression / Assessment and Plan / ED Course  I have reviewed the triage vital signs and the nursing notes.  Pertinent labs & imaging results that were available during my care of the patient were reviewed by me and considered in my medical decision making (see chart for details).     Consult: Reviewed with Dr. Wynelle Cleveland for admission. Final Clinical Impressions(s) / ED  Diagnoses   Final diagnoses:  Syncope and collapse  Malignant neoplasm of head of pancreas ([place])  Observed seizure-like activity ([place])   Patient presents as outlined above.  At this time she has recovered normal mental status without focal neurologic deficit.  CT does not show acute finding.  Consideration is given to seizure episode versus period of orthostasis or hypoperfusion with tonic-clonic activity.  Patient lives alone independently.  Her niece was only incidentally there to assist her with bathing.  Patient has high risk of injury or prolonged downtime if this has recurrence without a caregiver present.  Plan will be for admission for continued observation and diagnostic evaluation for syncope with seizure-like activity. ED Discharge Orders    None       Charlesetta Shanks, MD 04/22/18 [phone]

## 2018-04-22 NOTE — ED Notes (Signed)
Report given to 4W RN.

## 2018-04-22 NOTE — ED Notes (Signed)
Hospitalist at bedside with patient and family

## 2018-04-22 NOTE — ED Notes (Signed)
Bed: TC76 Expected date: 04/22/18 Expected time: 1:52 PM Means of arrival: Ambulance Comments: Ca pt, syncope

## 2018-04-22 NOTE — ED Triage Notes (Signed)
Pt comes from home. Pt is cancer patient. Pt caregiver called 911 due to patient having near syncopal episode while sitting before getting in shower. Upon arrival EMS measured BP 70 palpated. Once in EMS truck in route to facility BP is now WNL. Pt did not hit head or have full sycopial episode per patient and caregiver. Pt is AOx4 and is minimally ambulatory.

## 2018-04-22 NOTE — ED Notes (Signed)
ED TO INPATIENT HANDOFF REPORT  Name/Age/Gender Lauren Richard 82 y.o. female  Code Status    Code Status Orders  (From admission, onward)        Start     Ordered   04/22/18 1716  Full code  Continuous     04/22/18 1716    Code Status History    Date Active Date Inactive Code Status Order ID Comments User Context   03/29/2018 1502 04/04/2018 1722 Full Code [id number]  Dessa Phi, DO ED   01/05/2018 1454 01/14/2018 2035 Full Code [id number]  Stark Klein, MD Inpatient   11/03/2017 2044 11/06/2017 1851 Full Code [id number]  Mariel Aloe, MD Inpatient   04/26/2017 1147 04/28/2017 2312 Full Code [id number]  Cherylann Ratel, PA-C Inpatient      Home/SNF/Other Home  Chief Complaint sycope, CA pt  Level of Care/Admitting Diagnosis ED Disposition    ED Disposition Condition [place]: [facility] [[id number]]  Level of Care: Telemetry [5]  Admit to tele based on following criteria: Eval of Syncope  Diagnosis: Syncope [206001]  Admitting Physician: [place], [place]  Attending Physician: Debbe Odea [[id number]]  PT Class (Do Not Modify): Observation [104]  PT Acc Code (Do Not Modify): Observation [10022]       Medical History Past Medical History:  Diagnosis Date  . Adenocarcinoma of head of pancreas ([place]) 11/24/2017  . Arthritis    "all over" (04/26/2017); knees, shoulder  . Essential hypertension 03/03/2017  . Family history of breast cancer   . Family history of kidney cancer   . Family history of pancreatic cancer   . Family history of prostate cancer   . History of blood transfusion 1979   "when I had my hysterectomy"  . Hyperlipidemia   . Hypothyroidism   . Obesity 03/03/2017  . Osteoarthritis 03/03/2017  . Pneumonia    as a child    Allergies No Known Allergies  IV Location/Drains/Wounds Patient Lines/Drains/Airways Status   Active Line/Drains/Airways    Name:   Placement date:   Placement time:   Site:   Days:    Implanted Port 04/11/18 Left Chest   04/11/18    0826    Chest   11   GI Stent 10 Fr.   11/04/17    1132    -   169   Incision (Closed) 04/11/18 Chest Left   04/11/18    0826     11          Labs/Imaging Results for orders placed or performed during the hospital encounter of 04/22/18 (from the past 48 hour(s))  Urinalysis, Routine w reflex microscopic     Status: None   Collection Time: 04/22/18  2:15 PM  Result Value Ref Range   Color, Urine YELLOW YELLOW   APPearance CLEAR CLEAR   Specific Gravity, Urine 1.005 1.005 - 1.030   pH 8.0 5.0 - 8.0   Glucose, UA NEGATIVE NEGATIVE mg/dL   Hgb urine dipstick NEGATIVE NEGATIVE   Bilirubin Urine NEGATIVE NEGATIVE   Ketones, ur NEGATIVE NEGATIVE mg/dL   Protein, ur NEGATIVE NEGATIVE mg/dL   Nitrite NEGATIVE NEGATIVE   Leukocytes, UA NEGATIVE NEGATIVE    Comment: Performed at [facility], [place] [address], [place], [place] [address]  CBG monitoring, ED     Status: None   Collection Time: 04/22/18  2:41 PM  Result Value Ref Range   Glucose-Capillary 98 70 - 99 mg/dL  Basic  metabolic panel     Status: Abnormal   Collection Time: 04/22/18  2:54 PM  Result Value Ref Range   Sodium 141 135 - 145 mmol/L   Potassium 2.9 (L) 3.5 - 5.1 mmol/L   Chloride 110 98 - 111 mmol/L    Comment: Please note change in reference range.   CO2 24 22 - 32 mmol/L   Glucose, Bld 104 (H) 70 - 99 mg/dL    Comment: Please note change in reference range.   BUN 5 (L) 8 - 23 mg/dL    Comment: Please note change in reference range.   Creatinine, Ser 0.57 0.44 - 1.00 mg/dL   Calcium 8.1 (L) 8.9 - 10.3 mg/dL   GFR calc non Af Amer >60 >60 mL/min   GFR calc Af Amer >60 >60 mL/min    Comment: (NOTE) The eGFR has been calculated using the CKD EPI equation. This calculation has not been validated in all clinical situations. eGFR's persistently <60 mL/min signify possible Chronic Kidney Disease.    Anion gap 7 5 - 15    Comment: Performed at  [facility], [place] [address], [place], [place] [address]  CBC     Status: Abnormal   Collection Time: 04/22/18  2:54 PM  Result Value Ref Range   WBC 4.9 4.0 - 10.5 K/uL   RBC 2.61 (L) 3.87 - 5.11 MIL/uL   Hemoglobin 8.4 (L) 12.0 - 15.0 g/dL   HCT 25.3 (L) 36.0 - 46.0 %   MCV 96.9 78.0 - 100.0 fL   MCH 32.2 26.0 - 34.0 pg   MCHC 33.2 30.0 - 36.0 g/dL   RDW 21.4 (H) 11.5 - 15.5 %   Platelets 200 150 - 400 K/uL    Comment: Performed at [facility], [place] [address], [place], [place] [address]  I-stat troponin, ED     Status: None   Collection Time: 04/22/18  3:30 PM  Result Value Ref Range   Troponin i, poc 0.00 0.00 - 0.08 ng/mL   Comment 3            Comment: Due to the release kinetics of cTnI, a negative result within the first hours of the onset of symptoms does not rule out myocardial infarction with certainty. If myocardial infarction is still suspected, repeat the test at appropriate intervals.   POC occult blood, ED Provider will collect     Status: Abnormal   Collection Time: 04/22/18  3:32 PM  Result Value Ref Range   Fecal Occult Bld POSITIVE (A) NEGATIVE   Ct Head Wo Contrast  Result Date: 04/22/2018 CLINICAL DATA:  82 year old female with history of cancer presenting with near syncopal episode. EXAM: CT HEAD WITHOUT CONTRAST TECHNIQUE: Contiguous axial images were obtained from the base of the skull through the vertex without intravenous contrast. COMPARISON:  None. FINDINGS: Brain: No evidence of acute infarction, hemorrhage, hydrocephalus, extra-axial collection or mass lesion/mass effect. Vascular: No hyperdense vessel or unexpected calcification. Skull: Normal. Negative for fracture or focal lesion. Sinuses/Orbits: No acute finding. Other: None. IMPRESSION: 1. No acute intracranial abnormalities. The appearance of the brain is normal. Electronically Signed   By: Vinnie Langton M.D.   On: 04/22/2018 15:52    Pending  Labs Unresulted Labs (From admission, onward)   Start     Ordered   04/23/18 8786  Basic metabolic panel  Tomorrow morning,   R     04/22/18 1740   04/23/18 0500  CBC  Tomorrow morning,  R     04/22/18 1740   04/22/18 1741  Magnesium  Add-on,   R     04/22/18 1740      Vitals/Pain Today's Vitals   04/22/18 1600 04/22/18 1630 04/22/18 1700 04/22/18 1730  BP: (!) 143/59 120/73 130/62 128/68  Pulse: 68 65 64 66  Resp: _0 Temp:      TempSrc:      SpO2: 100% 100% 97% 97%  Weight:      Height:      PainSc:        Isolation Precautions No active isolations  Medications Medications  sodium chloride flush (NS) 0.9 % injection 3 mL (has no administration in time range)  sodium chloride flush (NS) 0.9 % injection 3 mL (has no administration in time range)  0.9 %  sodium chloride infusion (has no administration in time range)  acetaminophen (TYLENOL) tablet 650 mg (has no administration in time range)    Or  acetaminophen (TYLENOL) suppository 650 mg (has no administration in time range)  ondansetron (ZOFRAN) tablet 4 mg (has no administration in time range)    Or  ondansetron (ZOFRAN) injection 4 mg (has no administration in time range)  potassium chloride SA (K-DUR,KLOR-CON) CR tablet 40 mEq (has no administration in time range)  megestrol (MEGACE) 400 MG/10ML suspension 400 mg (has no administration in time range)  sodium chloride 0.9 % bolus 1,000 mL (has no administration in time range)  dextrose 5 % and 0.9 % NaCl with KCl 40 mEq/L infusion (has no administration in time range)  pantoprazole (PROTONIX) EC tablet 40 mg (has no administration in time range)    Mobility walks with device
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
## 2018-04-22 NOTE — ED Triage Notes (Signed)
Arrived via [facility] from home for reported near syncope. Hx of cancer. Pt A/O X 4.

## 2018-04-23 ENCOUNTER — Observation Stay (HOSPITAL_BASED_OUTPATIENT_CLINIC_OR_DEPARTMENT_OTHER): Payer: PPO

## 2018-04-23 DIAGNOSIS — D649 Anemia, unspecified: Secondary | ICD-10-CM | POA: Diagnosis not present

## 2018-04-23 DIAGNOSIS — I2699 Other pulmonary embolism without acute cor pulmonale: Secondary | ICD-10-CM | POA: Diagnosis present

## 2018-04-23 DIAGNOSIS — Z8051 Family history of malignant neoplasm of kidney: Secondary | ICD-10-CM | POA: Diagnosis not present

## 2018-04-23 DIAGNOSIS — T451X5A Adverse effect of antineoplastic and immunosuppressive drugs, initial encounter: Secondary | ICD-10-CM | POA: Diagnosis present

## 2018-04-23 DIAGNOSIS — D6481 Anemia due to antineoplastic chemotherapy: Secondary | ICD-10-CM | POA: Diagnosis present

## 2018-04-23 DIAGNOSIS — E440 Moderate protein-calorie malnutrition: Secondary | ICD-10-CM | POA: Diagnosis present

## 2018-04-23 DIAGNOSIS — R195 Other fecal abnormalities: Secondary | ICD-10-CM | POA: Diagnosis not present

## 2018-04-23 DIAGNOSIS — Z800 Family history of malignant neoplasm of digestive organs: Secondary | ICD-10-CM | POA: Diagnosis not present

## 2018-04-23 DIAGNOSIS — R55 Syncope and collapse: Secondary | ICD-10-CM

## 2018-04-23 DIAGNOSIS — I951 Orthostatic hypotension: Secondary | ICD-10-CM | POA: Diagnosis present

## 2018-04-23 DIAGNOSIS — Z6823 Body mass index (BMI) 23.0-23.9, adult: Secondary | ICD-10-CM | POA: Diagnosis not present

## 2018-04-23 DIAGNOSIS — E870 Hyperosmolality and hypernatremia: Secondary | ICD-10-CM | POA: Diagnosis present

## 2018-04-23 DIAGNOSIS — C259 Malignant neoplasm of pancreas, unspecified: Secondary | ICD-10-CM | POA: Diagnosis not present

## 2018-04-23 DIAGNOSIS — E039 Hypothyroidism, unspecified: Secondary | ICD-10-CM | POA: Diagnosis present

## 2018-04-23 DIAGNOSIS — C250 Malignant neoplasm of head of pancreas: Secondary | ICD-10-CM | POA: Diagnosis present

## 2018-04-23 DIAGNOSIS — Z96652 Presence of left artificial knee joint: Secondary | ICD-10-CM | POA: Diagnosis present

## 2018-04-23 DIAGNOSIS — E785 Hyperlipidemia, unspecified: Secondary | ICD-10-CM | POA: Diagnosis present

## 2018-04-23 DIAGNOSIS — C799 Secondary malignant neoplasm of unspecified site: Secondary | ICD-10-CM | POA: Diagnosis not present

## 2018-04-23 DIAGNOSIS — Z7901 Long term (current) use of anticoagulants: Secondary | ICD-10-CM | POA: Diagnosis not present

## 2018-04-23 DIAGNOSIS — Z801 Family history of malignant neoplasm of trachea, bronchus and lung: Secondary | ICD-10-CM | POA: Diagnosis not present

## 2018-04-23 DIAGNOSIS — E876 Hypokalemia: Secondary | ICD-10-CM

## 2018-04-23 DIAGNOSIS — I351 Nonrheumatic aortic (valve) insufficiency: Secondary | ICD-10-CM | POA: Diagnosis not present

## 2018-04-23 DIAGNOSIS — K922 Gastrointestinal hemorrhage, unspecified: Secondary | ICD-10-CM | POA: Diagnosis present

## 2018-04-23 DIAGNOSIS — D62 Acute posthemorrhagic anemia: Secondary | ICD-10-CM | POA: Diagnosis present

## 2018-04-23 DIAGNOSIS — I361 Nonrheumatic tricuspid (valve) insufficiency: Secondary | ICD-10-CM

## 2018-04-23 DIAGNOSIS — C787 Secondary malignant neoplasm of liver and intrahepatic bile duct: Secondary | ICD-10-CM | POA: Diagnosis present

## 2018-04-23 DIAGNOSIS — Z90411 Acquired partial absence of pancreas: Secondary | ICD-10-CM | POA: Diagnosis not present

## 2018-04-23 DIAGNOSIS — Z803 Family history of malignant neoplasm of breast: Secondary | ICD-10-CM | POA: Diagnosis not present

## 2018-04-23 DIAGNOSIS — E860 Dehydration: Secondary | ICD-10-CM | POA: Diagnosis present

## 2018-04-23 DIAGNOSIS — I10 Essential (primary) hypertension: Secondary | ICD-10-CM | POA: Diagnosis present

## 2018-04-23 DIAGNOSIS — Z8042 Family history of malignant neoplasm of prostate: Secondary | ICD-10-CM | POA: Diagnosis not present

## 2018-04-23 DIAGNOSIS — I959 Hypotension, unspecified: Secondary | ICD-10-CM | POA: Diagnosis not present

## 2018-04-23 LAB — CBC
HCT: 23.3 % — ABNORMAL LOW (ref 36.0–46.0)
HEMATOCRIT: 23.9 % — AB (ref 36.0–46.0)
Hemoglobin: 7.4 g/dL — ABNORMAL LOW (ref 12.0–15.0)
Hemoglobin: 7.7 g/dL — ABNORMAL LOW (ref 12.0–15.0)
MCH: 31.4 pg (ref 26.0–34.0)
MCH: 32.4 pg (ref 26.0–34.0)
MCHC: 31.8 g/dL (ref 30.0–36.0)
MCHC: 32.2 g/dL (ref 30.0–36.0)
MCV: 98.7 fL (ref 78.0–100.0)
MCV: 100.4 fL — ABNORMAL HIGH (ref 78.0–100.0)
PLATELETS: 187 10*3/uL (ref 150–400)
Platelets: 198 10*3/uL (ref 150–400)
RBC: 2.36 MIL/uL — ABNORMAL LOW (ref 3.87–5.11)
RBC: 2.38 MIL/uL — ABNORMAL LOW (ref 3.87–5.11)
RDW: 22.2 % — AB (ref 11.5–15.5)
RDW: 23 % — ABNORMAL HIGH (ref 11.5–15.5)
WBC: 5 10*3/uL (ref 4.0–10.5)
WBC: 4.8 10*3/uL (ref 4.0–10.5)

## 2018-04-23 LAB — BASIC METABOLIC PANEL
Anion gap: 4 — ABNORMAL LOW (ref 5–15)
CHLORIDE: 121 mmol/L — AB (ref 98–111)
CO2: 22 mmol/L (ref 22–32)
CREATININE: 0.63 mg/dL (ref 0.44–1.00)
Calcium: 8.1 mg/dL — ABNORMAL LOW (ref 8.9–10.3)
GFR calc Af Amer: >60 mL/min (ref >60)
Glucose, Bld: 104 mg/dL — ABNORMAL HIGH (ref 70–99)
Potassium: 3.6 mmol/L (ref 3.5–5.1)
SODIUM: 147 mmol/L — AB (ref 135–145)

## 2018-04-23 LAB — ECHOCARDIOGRAM COMPLETE
Height: 67 in
Weight: 2352 oz

## 2018-04-23 MED ORDER — DEXTROSE-NACL 5-0.45 % IV SOLN
INTRAVENOUS | Status: DC
  Administered 2018-04-23 – 2018-04-25 (×5): via INTRAVENOUS

## 2018-04-23 MED ORDER — ALTEPLASE 2 MG IJ SOLR
2.0000 mg | Freq: Once | INTRAMUSCULAR | Status: DC
  Filled 2018-04-23: qty 2

## 2018-04-23 NOTE — H&P (View-Only) (Signed)
Referring Provider: [facility]   Primary Care Physician:  Glendale Chard, MD Primary Gastroenterologist:  unassigned  Reason for Consultation: anemia and FOBT+    ASSESSMENT AND PLAN:    25. 82 yo female with hx of Stage III (pT3, pN2, cM0) pancreatic cancer, s/p Whipple late March 2019. Getting chemotherapy with Dr. Burr Medico.   2. Worsening of chronic normocytic anemia, FOBT+ on Xarelto. Also wonder if chemo contributing to anemia.  Hgb has drifted a couple of grams over last few weeks. No overt GI bleeding.  -Will arrange for EGD for further evaluation. The risks and benefits of EGD were discussed and the patient agrees to proceed.  -Last colon[place] many [place]. Hopefully she won't need another.  -Currently not SOB. Monitor CBC, transfuse if needed  3. PE on Xarelto which is currently on hold. Last dose yesterday. No active GI bleeding. Should she be on heparin gtt and we can hold for procedure?   4. Near syncope.  Etiology not really clear.  She was hypotensive when EMS arrived. No major findings on Echo. Head CT scan negative  HPI: MAUDEAN HOFFMANN is a 82 y.o. female with a history of Stage III (pT3, pN2, cM0) - pancreatic head cancer, status post pancreaticoduodenectomy late March 2019.  She started chemotherapy early June.  She was admitted mid to late June for vomiting, diarrhea, dehydration and electrolyte abnormalities as well as neutropenic fever. Patient presented to the ED yesterday with near syncopal episode and shaking.  SBP in the 70s when EMS arrived.  She has been given IV fluids with improvement in blood pressure.  Hemoglobin had been in 9-10 range, now drifted to 7.4. Stools are heme positive. She has no abdominal pain. No N/V.Marland Kitchen She was taking a daily asa until just recently.     Past Medical History:  Diagnosis Date  . Adenocarcinoma of head of pancreas ([place]) 11/24/2017  . Arthritis    "all over" (04/26/2017); knees, shoulder  . Essential  hypertension 03/03/2017  . Family history of breast cancer   . Family history of kidney cancer   . Family history of pancreatic cancer   . Family history of prostate cancer   . History of blood transfusion 1979   "when I had my hysterectomy"  . Hyperlipidemia   . Hypothyroidism   . Obesity 03/03/2017  . Osteoarthritis 03/03/2017  . Pneumonia    as a child    Past Surgical History:  Procedure Laterality Date  . BREAST CYST EXCISION Right    "cut 6 cysts out"  . CYST EXCISION Left    "off my toe"  . DILATION AND CURETTAGE OF UTERUS     "before hysterectomy"  . ERCP N/A 11/04/2017   Procedure: ENDOSCOPIC RETROGRADE CHOLANGIOPANCREATOGRAPHY (ERCP);  Surgeon: Carol Ada, MD;  Location: [facility];  Service: Endoscopy;  Laterality: N/A;  . EUS N/A 11/11/2017   Procedure: UPPER ENDOSCOPIC ULTRASOUND (EUS) LINEAR;  Surgeon: Carol Ada, MD;  Location: [facility];  Service: Endoscopy;  Laterality: N/A;  . FINE NEEDLE ASPIRATION N/A 11/11/2017   Procedure: FINE NEEDLE ASPIRATION (FNA) LINEAR;  Surgeon: Carol Ada, MD;  Location: [facility];  Service: Endoscopy;  Laterality: N/A;  . INGUINAL HERNIA REPAIR Right   . LAPAROSCOPY N/A 01/05/2018   Procedure: LAPAROSCOPY DIAGNOSTIC ERAS PATHWAY;  Surgeon: Stark Klein, MD;  Location: [facility];  Service: General;  Laterality: N/A;  [place]  . PORTACATH PLACEMENT Left 04/11/2018   Procedure: INSERTION PORT-A-CATH;  Surgeon: Stark Klein,  MD;  Location: [facility];  Service: General;  Laterality: Left;  . REDUCTION MAMMAPLASTY Bilateral 1980  . TONSILLECTOMY     "when I small"  . TOTAL ABDOMINAL HYSTERECTOMY  1979  . TOTAL KNEE ARTHROPLASTY Left 04/26/2017   Procedure: LEFT TOTAL KNEE ARTHROPLASTY;  Surgeon: [name], MD;  Location: [facility];  Service: Orthopedics;  Laterality: Left;  . WHIPPLE PROCEDURE N/A 01/05/2018   Procedure: WHIPPLE PROCEDURE;  Surgeon: Stark Klein, MD;  Location: [facility];  Service: General;  Laterality: N/A;     Prior to Admission medications   Medication Sig Start Date End Date Taking? Authorizing Provider  amLODipine (NORVASC) 5 MG tablet Take 5 mg by mouth daily.  08/12/16  Yes [provider]  atenolol (TENORMIN) 25 MG tablet Take 1 tablet (25 mg total) by mouth daily. 11/07/17  Yes Hongalgi, Lenis Dickinson, MD  capecitabine (XELODA) 500 MG tablet Take 2 tablets (1000mg ) by mouth 2 times daily, within 30 min of finishing food. Take on days 1-14, of each 21 day cycle 04/21/18  Yes Truitt Merle, MD  Cholecalciferol (VITAMIN D) 2000 units tablet Take 2,000 Units by mouth daily.   Yes [provider]  gabapentin (NEURONTIN) 100 MG capsule Take 2 capsules (200 mg total) by mouth 2 (two) times daily. 01/13/18  Yes Stark Klein, MD  lidocaine-prilocaine (EMLA) cream Apply 1 application topically as needed. 04/14/18  Yes Truitt Merle, MD  mirtazapine (REMERON) 7.5 MG tablet Take 1 tablet (7.5 mg total) by mouth at bedtime. 03/21/18  Yes Alla Feeling, NP  nystatin (MYCOSTATIN) 100000 UNIT/ML suspension Take 5 mLs (500,000 Units total) by mouth 4 (four) times daily. 03/24/18  Yes Alla Feeling, NP  ondansetron (ZOFRAN) 8 MG tablet Take 8 mg by mouth 2 (two) times daily as needed for nausea/vomiting. 03/23/18  Yes [provider]  pantoprazole (PROTONIX) 40 MG tablet Take 1 tablet (40 mg total) by mouth daily at 12 noon. 01/13/18  Yes Stark Klein, MD  potassium chloride SA (K-DUR,KLOR-CON) 20 MEQ tablet Take 2 tablets (40 mEq total) by mouth daily. 04/20/18  Yes Truitt Merle, MD  PRESCRIPTION MEDICATION Apply 1 application topically 4 (four) times daily as needed (for back pain). Dic 3%, Bac 2%, lid - pril 2-5%, Menth 1% cream    Yes [provider]  prochlorperazine (COMPAZINE) 10 MG tablet Take 10 mg by mouth every 6 (six) hours as needed for nausea or vomiting.   Yes [provider]  rivaroxaban (XARELTO) 20 MG TABS tablet Take 1 tablet (20 mg total) by mouth daily with supper.  04/04/18  Yes Sheikh, Omair Latif, DO  trolamine salicylate (ASPERCREME) 10 % cream Apply 1 application topically 4 (four) times daily as needed (for knee pain/muscle pain.).    Yes [provider]  traMADol (ULTRAM) 50 MG tablet Take 50 mg by mouth every 12 (twelve) hours as needed (for pain.).    [provider]    Current Facility-Administered Medications  Medication Dose Route Frequency Provider Last Rate Last Dose  . 0.9 %  sodium chloride infusion  250 mL Intravenous PRN Debbe Odea, MD      . acetaminophen (TYLENOL) tablet 650 mg  650 mg Oral Q6H PRN Debbe Odea, MD       Or  . acetaminophen (TYLENOL) suppository 650 mg  650 mg Rectal Q6H PRN Rizwan, Saima, MD      . dextrose 5 % and 0.9 % NaCl with KCl 40 mEq/L infusion  Intravenous Continuous Debbe Odea, MD 100 mL/hr at 04/23/18 0759    . feeding supplement (ENSURE ENLIVE) (ENSURE ENLIVE) liquid 237 mL  237 mL Oral BID BM Rizwan, Saima, MD      . mirtazapine (REMERON) tablet 15 mg  15 mg Oral QHS Bodenheimer, Charles A, NP   15 mg at 04/22/18 2200  . nystatin (MYCOSTATIN) 100000 UNIT/ML suspension 500,000 Units  5 mL Oral QID Arby Barrette A, NP   500,000 Units at 04/23/18 1044  . ondansetron (ZOFRAN) tablet 4 mg  4 mg Oral Q6H PRN Debbe Odea, MD       Or  . ondansetron (ZOFRAN) injection 4 mg  4 mg Intravenous Q6H PRN Rizwan, Saima, MD      . pantoprazole (PROTONIX) EC tablet 40 mg  40 mg Oral Daily Debbe Odea, MD   40 mg at 04/23/18 1043  . prochlorperazine (COMPAZINE) tablet 10 mg  10 mg Oral Q6H PRN Bodenheimer, Charles A, NP      . sodium chloride flush (NS) 0.9 % injection 3 mL  3 mL Intravenous Q12H Rizwan, Saima, MD      . sodium chloride flush (NS) 0.9 % injection 3 mL  3 mL Intravenous PRN Rizwan, Saima, MD      . traMADol (ULTRAM) tablet 50 mg  50 mg Oral Q12H PRN Bodenheimer, Clenton Pare, NP       Facility-Administered Medications Ordered in Other Encounters  Medication Dose Route  Frequency Provider Last Rate Last Dose  . potassium chloride SA (K-DUR,KLOR-CON) CR tablet 40 mEq  40 mEq Oral Once Truitt Merle, MD        Allergies as of 04/22/2018  . (No Known Allergies)    Family History  Problem Relation Age of Onset  . Kidney cancer Mother 64  . Lung cancer Father 23       lung, smoker  . Congestive Heart Failure Sister   . Asthma Sister   . Atrial fibrillation Sister   . Hypertension Sister   . Diabetes Sister   . Head & neck cancer Paternal Grandmother   . Cancer Maternal Uncle        type unk  . Pancreatic cancer Paternal Aunt        dx >50  . Cancer Paternal Uncle        type unk  . Breast cancer Cousin 94  . Prostate cancer Other 62       had robotic surgery    Social History   Socioeconomic History  . Marital status: Widowed    Spouse name: Not on file  . Number of children: Not on file  . Years of education: Not on file  . Highest education level: Not on file  Occupational History  . Not on file  Social Needs  . Financial resource strain: Not on file  . Food insecurity:    Worry: Not on file    Inability: Not on file  . Transportation needs:    Medical: Not on file    Non-medical: Not on file  Tobacco Use  . Smoking status: Never Smoker  . Smokeless tobacco: Never Used  Substance and Sexual Activity  . Alcohol use: No    Alcohol/week: 0.0 oz    Frequency: Never    Comment: occasional glass of wine   . Drug use: No  . Sexual activity: Never  Lifestyle  . Physical activity:    Days per week: Not on file    Minutes per session:  Not on file  . Stress: Not on file  Relationships  . Social connections:    Talks on phone: Not on file    Gets together: Not on file    Attends religious service: Not on file    Active member of club or organization: Not on file    Attends meetings of clubs or organizations: Not on file    Relationship status: Not on file  . Intimate partner violence:    Fear of current or ex partner: Not on file     Emotionally abused: Not on file    Physically abused: Not on file    Forced sexual activity: Not on file  Other Topics Concern  . Not on file  Social History Narrative  . Not on file    Review of Systems: All systems reviewed and negative except where noted in HPI.  Physical Exam: Vital signs in last 24 hours: Temp:  [97.8 F (36.6 C)-98.9 F (37.2 C)] 98.9 F (37.2 C) (07/14 1257) Pulse Rate:  [64-80] 80 (07/14 1257) Resp:  [15-20] 16 (07/14 1257) BP: (109-144)/(59-93) 138/73 (07/14 1257) SpO2:  [95 %-100 %] 100 % (07/14 1257) Weight:  [147 lb (66.7 kg)-165 lb (74.8 kg)] 147 lb (66.7 kg) (07/13 1443) Last BM Date: 04/21/18 General:   Alert, well-developed,  female in NAD Psych:  Pleasant, cooperative. Normal mood and affect. Eyes:  Pupils equal, sclera clear, no icterus.   Conjunctiva pink. Ears:  Normal auditory acuity. Nose:  No deformity, discharge,  or lesions. Neck:  Supple; no masses Lungs:  Clear throughout to auscultation.   No wheezes, crackles, or rhonchi.  Heart:  Regular rate and rhythm; no murmurs, no edema Abdomen:  Soft, non-distended, nontender, BS active, no palp mass    Rectal:  Deferred  Msk:  Symmetrical without gross deformities. . Neurologic:  Alert and  oriented x4;  grossly normal neurologically. Skin:  Intact without significant lesions or rashes..   Intake/Output from previous day: 07/13 0701 - 07/14 0700 In: 798.3 [I.V.:798.3] Out: -  Intake/Output this shift: Total I/O In: -  Out: 200 [Urine:200]  Lab Results: Recent Labs    04/22/18 1454 04/23/18 0345  WBC 4.9 5.0  HGB 8.4* 7.4*  HCT 25.3* 23.3*  PLT 200 187   BMET Recent Labs    04/22/18 1454 04/23/18 0345  NA 141 147*  K 2.9* 3.6  CL 110 121*  CO2 24 22  GLUCOSE 104* 104*  BUN 5* <5*  CREATININE 0.57 0.63  CALCIUM 8.1* 8.1*     Studies/Results: Ct Head Wo Contrast  Result Date: 04/22/2018 CLINICAL DATA:  82 year old female with history of cancer  presenting with near syncopal episode. EXAM: CT HEAD WITHOUT CONTRAST TECHNIQUE: Contiguous axial images were obtained from the base of the skull through the vertex without intravenous contrast. COMPARISON:  None. FINDINGS: Brain: No evidence of acute infarction, hemorrhage, hydrocephalus, extra-axial collection or mass lesion/mass effect. Vascular: No hyperdense vessel or unexpected calcification. Skull: Normal. Negative for fracture or focal lesion. Sinuses/Orbits: No acute finding. Other: None. IMPRESSION: 1. No acute intracranial abnormalities. The appearance of the brain is normal. Electronically Signed   By: Vinnie Langton M.D.   On: 04/22/2018 15:52     Tye Savoy, NP-C @  04/23/2018, 1:50 PM   Attending physician's note   I have taken a history, examined the patient and reviewed the chart. I agree with the Advanced Practitioner's note, impression and recommendations. 68 yr F recently diagnosed with stage III  pancreatic Ca s/p Whipple procedure March 2019, PE on Xarelto since May 2019, s/p port placement on Chemotherapy for past 3-4 weeks admitted after syncopal episode CT head negative for acute intra cranial abnormalities Echo with EF 55-60% Anemic with Hgb 7.4, heme occult positive No overt GI bleed Plan for EGD tomorrow to evaluate any upper GI source of bleeding. ?anastomotic ulcer. Xarelto currently on hold , recent PE and has no evidence of overt GI bleeding, No contraindication from GI standpoint to start heparin gtt and recommend holding it 4 hours prior to the procedure. Will leave it upto the primary team to manage anticoagulation NPO after midnight The risks and benefits as well as alternatives of endoscopic procedure(s) have been discussed and reviewed. All questions answered. The patient agrees to proceed.   Damaris Hippo , MD [phone]

## 2018-04-23 NOTE — Evaluation (Signed)
Physical Therapy Evaluation Patient Details Name: Lauren Richard MRN: [id number] DOB: 10/18/1935 Today's Date: 04/23/2018   History of Present Illness   82 y.o. female who lives alone with medical history of pancreatic cancer with mets to the liver s/p Whipple's this past March on chemo now, HTN, HLD, PE on Xarelto (5/19) who presents with near syncope and shaking of extremities. SBP found to be in 70s but improved when in the ED. She was not given IVF.  Treated for low BP, hypokalemia, loss of appetite.   Clinical Impression  Pt presents at/near baseline function, able to move in/out of bed and up/down off seat unassisted, and is ambulatory with RW and no assist.  Pt feels near normal and no concerns about mobility at home.  Recommend walk 2-3 times daily with nursing assist short bouts and restart HHPT on dc from hospital.  No further acute PT needs at this time, no DME needs for home.      Follow Up Recommendations Home health PT(restart HHPT)    Equipment Recommendations  None recommended by PT    Recommendations for Other Services       Precautions / Restrictions Restrictions Weight Bearing Restrictions: No      Mobility  Bed Mobility Overal bed mobility: Independent             General bed mobility comments: into/out of bed unassisted, with incr time from flat bed but unaided  Transfers Overall transfer level: Modified independent Equipment used: Rolling walker (2 wheeled) Transfers: Sit to/from Stand Sit to Stand: Modified independent (Device/Increase time)         General transfer comment: up/down unassisted no issues noted and stable throughout  Ambulation/Gait Ambulation/Gait assistance: Supervision Gait Distance (Feet): 225 Feet Assistive device: Rolling walker (2 wheeled) Gait Pattern/deviations: Step-through pattern;Decreased stride length;Trunk flexed Gait velocity: decr   General Gait Details: cues to stay close to RW/use upright posture,  otherwise slow but steady gait, denies SOB but mild incr WOB after walk, denies dizziness/lightheadedness and no s/s of distress.  Stairs         General stair comments: pt denies worries/concerns about stairs; IV limited ability to practice but pt confident in ability  Wheelchair Mobility    Modified Rankin (Stroke Patients Only)       Balance Overall balance assessment: No apparent balance deficits (not formally assessed) Sitting-balance support: No upper extremity supported;Feet supported Sitting balance-Leahy Scale: Good     Standing balance support: Bilateral upper extremity supported;No upper extremity supported;During functional activity Standing balance-Leahy Scale: Fair(uses walker)                               Pertinent Vitals/Pain Pain Assessment: No/denies pain    Home Living Family/patient expects to be discharged to:: Private residence Living Arrangements: Other relatives Available Help at Discharge: Family;Available PRN/intermittently Type of Home: Apartment Home Access: Stairs to enter Entrance Stairs-Rails: Right;Left Entrance Stairs-Number of Steps: full flight  Home Layout: One level Home Equipment: Walker - 2 wheels;Wheelchair - [employer];Shower seat      Prior Function Level of Independence: Independent with assistive device(s)         Comments: niece lives with pt, helps as needed; pt normally amb with RW and occas assist as she describes; receiving HHPT     Hand Dominance   Dominant Hand: Right    Extremity/Trunk Assessment   Upper Extremity Assessment Upper Extremity Assessment: Overall WFL for  tasks assessed    Lower Extremity Assessment Lower Extremity Assessment: Overall WFL for tasks assessed;Generalized weakness    Cervical / Trunk Assessment Cervical / Trunk Assessment: Normal  Communication   Communication: No difficulties  Cognition Arousal/Alertness: Awake/alert Behavior During Therapy:  WFL for tasks assessed/performed Overall Cognitive Status: Within Functional Limits for tasks assessed                                        General Comments General comments (skin integrity, edema, etc.): pt has good color and energy, reports feeling much better today    Exercises     Assessment/Plan    PT Assessment All further PT needs can be met in the next venue of care  PT Problem List Decreased activity tolerance       PT Treatment Interventions      PT Goals (Current goals can be found in the Care Plan section)  Acute Rehab PT Goals Patient Stated Goal: go home, not get sick from chemo again PT Goal Formulation: All assessment and education complete, DC therapy    Frequency     Barriers to discharge        Co-evaluation               AM-PAC PT "6 Clicks" Daily Activity  Outcome Measure Difficulty turning over in bed (including adjusting bedclothes, sheets and blankets)?: A Little Difficulty moving from lying on back to sitting on the side of the bed? : A Little Difficulty sitting down on and standing up from a chair with arms (e.g., wheelchair, bedside commode, etc,.)?: None Help needed moving to and from a bed to chair (including a wheelchair)?: None Help needed walking in hospital room?: None Help needed climbing 3-5 steps with a railing? : A Little 6 Click Score: 21    End of Session   Activity Tolerance: Patient tolerated treatment well Patient left: in bed;with call bell/phone within reach Nurse Communication: Mobility status PT Visit Diagnosis: Difficulty in walking, not elsewhere classified (R26.2)    Time: 8347-5830 PT Time Calculation (min) (ACUTE ONLY): 27 min   Charges:   PT Evaluation $PT Eval Low Complexity: 1 Low PT Treatments $Gait Training: 8-22 mins   PT G Codes:        Kearney Hard, PT, DPT, MS Board Certified Geriatric Clinical Specialist  Herbie Drape 04/23/2018, 10:37 AM

## 2018-04-23 NOTE — Consult Note (Addendum)
Referring Provider: [facility]   Primary Care Physician:  Glendale Chard, MD Primary Gastroenterologist:  unassigned  Reason for Consultation: anemia and FOBT+    ASSESSMENT AND PLAN:    58. 82 yo female with hx of Stage III (pT3, pN2, cM0) pancreatic cancer, s/p Whipple late March 2019. Getting chemotherapy with Dr. Burr Medico.   2. Worsening of chronic normocytic anemia, FOBT+ on Xarelto. Also wonder if chemo contributing to anemia.  Hgb has drifted a couple of grams over last few weeks. No overt GI bleeding.  -Will arrange for EGD for further evaluation. The risks and benefits of EGD were discussed and the patient agrees to proceed.  -Last colon[place] many [place]. Hopefully she won't need another.  -Currently not SOB. Monitor CBC, transfuse if needed  3. PE on Xarelto which is currently on hold. Last dose yesterday. No active GI bleeding. Should she be on heparin gtt and we can hold for procedure?   4. Near syncope.  Etiology not really clear.  She was hypotensive when EMS arrived. No major findings on Echo. Head CT scan negative  HPI: Lauren Richard is a 82 y.o. female with a history of Stage III (pT3, pN2, cM0) - pancreatic head cancer, status post pancreaticoduodenectomy late March 2019.  She started chemotherapy early June.  She was admitted mid to late June for vomiting, diarrhea, dehydration and electrolyte abnormalities as well as neutropenic fever. Patient presented to the ED yesterday with near syncopal episode and shaking.  SBP in the 70s when EMS arrived.  She has been given IV fluids with improvement in blood pressure.  Hemoglobin had been in 9-10 range, now drifted to 7.4. Stools are heme positive. She has no abdominal pain. No N/V.Marland Kitchen She was taking a daily asa until just recently.     Past Medical History:  Diagnosis Date  . Adenocarcinoma of head of pancreas ([place]) 11/24/2017  . Arthritis    "all over" (04/26/2017); knees, shoulder  . Essential  hypertension 03/03/2017  . Family history of breast cancer   . Family history of kidney cancer   . Family history of pancreatic cancer   . Family history of prostate cancer   . History of blood transfusion 1979   "when I had my hysterectomy"  . Hyperlipidemia   . Hypothyroidism   . Obesity 03/03/2017  . Osteoarthritis 03/03/2017  . Pneumonia    as a child    Past Surgical History:  Procedure Laterality Date  . BREAST CYST EXCISION Right    "cut 6 cysts out"  . CYST EXCISION Left    "off my toe"  . DILATION AND CURETTAGE OF UTERUS     "before hysterectomy"  . ERCP N/A 11/04/2017   Procedure: ENDOSCOPIC RETROGRADE CHOLANGIOPANCREATOGRAPHY (ERCP);  Surgeon: Carol Ada, MD;  Location: [facility];  Service: Endoscopy;  Laterality: N/A;  . EUS N/A 11/11/2017   Procedure: UPPER ENDOSCOPIC ULTRASOUND (EUS) LINEAR;  Surgeon: Carol Ada, MD;  Location: [facility];  Service: Endoscopy;  Laterality: N/A;  . FINE NEEDLE ASPIRATION N/A 11/11/2017   Procedure: FINE NEEDLE ASPIRATION (FNA) LINEAR;  Surgeon: Carol Ada, MD;  Location: [facility];  Service: Endoscopy;  Laterality: N/A;  . INGUINAL HERNIA REPAIR Right   . LAPAROSCOPY N/A 01/05/2018   Procedure: LAPAROSCOPY DIAGNOSTIC ERAS PATHWAY;  Surgeon: Stark Klein, MD;  Location: [facility];  Service: General;  Laterality: N/A;  [place]  . PORTACATH PLACEMENT Left 04/11/2018   Procedure: INSERTION PORT-A-CATH;  Surgeon: Stark Klein,  MD;  Location: [place];  Service: General;  Laterality: Left;  . REDUCTION MAMMAPLASTY Bilateral 1980  . TONSILLECTOMY     "when I small"  . TOTAL ABDOMINAL HYSTERECTOMY  1979  . TOTAL KNEE ARTHROPLASTY Left 04/26/2017   Procedure: LEFT TOTAL KNEE ARTHROPLASTY;  Surgeon: [name], MD;  Location: [facility];  Service: Orthopedics;  Laterality: Left;  . WHIPPLE PROCEDURE N/A 01/05/2018   Procedure: WHIPPLE PROCEDURE;  Surgeon: Stark Klein, MD;  Location: [facility];  Service: General;  Laterality: N/A;     Prior to Admission medications   Medication Sig Start Date End Date Taking? Authorizing Provider  amLODipine (NORVASC) 5 MG tablet Take 5 mg by mouth daily.  08/12/16  Yes [provider]  atenolol (TENORMIN) 25 MG tablet Take 1 tablet (25 mg total) by mouth daily. 11/07/17  Yes Hongalgi, Lenis Dickinson, MD  capecitabine (XELODA) 500 MG tablet Take 2 tablets (1000mg ) by mouth 2 times daily, within 30 min of finishing food. Take on days 1-14, of each 21 day cycle 04/21/18  Yes Truitt Merle, MD  Cholecalciferol (VITAMIN D) 2000 units tablet Take 2,000 Units by mouth daily.   Yes [provider]  gabapentin (NEURONTIN) 100 MG capsule Take 2 capsules (200 mg total) by mouth 2 (two) times daily. 01/13/18  Yes Stark Klein, MD  lidocaine-prilocaine (EMLA) cream Apply 1 application topically as needed. 04/14/18  Yes Truitt Merle, MD  mirtazapine (REMERON) 7.5 MG tablet Take 1 tablet (7.5 mg total) by mouth at bedtime. 03/21/18  Yes Alla Feeling, NP  nystatin (MYCOSTATIN) 100000 UNIT/ML suspension Take 5 mLs (500,000 Units total) by mouth 4 (four) times daily. 03/24/18  Yes Alla Feeling, NP  ondansetron (ZOFRAN) 8 MG tablet Take 8 mg by mouth 2 (two) times daily as needed for nausea/vomiting. 03/23/18  Yes [provider]  pantoprazole (PROTONIX) 40 MG tablet Take 1 tablet (40 mg total) by mouth daily at 12 noon. 01/13/18  Yes Stark Klein, MD  potassium chloride SA (K-DUR,KLOR-CON) 20 MEQ tablet Take 2 tablets (40 mEq total) by mouth daily. 04/20/18  Yes Truitt Merle, MD  PRESCRIPTION MEDICATION Apply 1 application topically 4 (four) times daily as needed (for back pain). Dic 3%, Bac 2%, lid - pril 2-5%, Menth 1% cream    Yes [provider]  prochlorperazine (COMPAZINE) 10 MG tablet Take 10 mg by mouth every 6 (six) hours as needed for nausea or vomiting.   Yes [provider]  rivaroxaban (XARELTO) 20 MG TABS tablet Take 1 tablet (20 mg total) by mouth daily with supper.  04/04/18  Yes Sheikh, Omair Latif, DO  trolamine salicylate (ASPERCREME) 10 % cream Apply 1 application topically 4 (four) times daily as needed (for knee pain/muscle pain.).    Yes [provider]  traMADol (ULTRAM) 50 MG tablet Take 50 mg by mouth every 12 (twelve) hours as needed (for pain.).    [provider]    Current Facility-Administered Medications  Medication Dose Route Frequency Provider Last Rate Last Dose  . 0.9 %  sodium chloride infusion  250 mL Intravenous PRN Debbe Odea, MD      . acetaminophen (TYLENOL) tablet 650 mg  650 mg Oral Q6H PRN Debbe Odea, MD       Or  . acetaminophen (TYLENOL) suppository 650 mg  650 mg Rectal Q6H PRN Rizwan, Saima, MD      . dextrose 5 % and 0.9 % NaCl with KCl 40 mEq/L infusion  Intravenous Continuous Debbe Odea, MD 100 mL/hr at 04/23/18 0759    . feeding supplement (ENSURE ENLIVE) (ENSURE ENLIVE) liquid 237 mL  237 mL Oral BID BM Rizwan, Saima, MD      . mirtazapine (REMERON) tablet 15 mg  15 mg Oral QHS Bodenheimer, Charles A, NP   15 mg at 04/22/18 2200  . nystatin (MYCOSTATIN) 100000 UNIT/ML suspension 500,000 Units  5 mL Oral QID Arby Barrette A, NP   500,000 Units at 04/23/18 1044  . ondansetron (ZOFRAN) tablet 4 mg  4 mg Oral Q6H PRN Debbe Odea, MD       Or  . ondansetron (ZOFRAN) injection 4 mg  4 mg Intravenous Q6H PRN Rizwan, Saima, MD      . pantoprazole (PROTONIX) EC tablet 40 mg  40 mg Oral Daily Debbe Odea, MD   40 mg at 04/23/18 1043  . prochlorperazine (COMPAZINE) tablet 10 mg  10 mg Oral Q6H PRN Bodenheimer, Charles A, NP      . sodium chloride flush (NS) 0.9 % injection 3 mL  3 mL Intravenous Q12H Rizwan, Saima, MD      . sodium chloride flush (NS) 0.9 % injection 3 mL  3 mL Intravenous PRN Rizwan, Saima, MD      . traMADol (ULTRAM) tablet 50 mg  50 mg Oral Q12H PRN Bodenheimer, Clenton Pare, NP       Facility-Administered Medications Ordered in Other Encounters  Medication Dose Route  Frequency Provider Last Rate Last Dose  . potassium chloride SA (K-DUR,KLOR-CON) CR tablet 40 mEq  40 mEq Oral Once Truitt Merle, MD        Allergies as of 04/22/2018  . (No Known Allergies)    Family History  Problem Relation Age of Onset  . Kidney cancer Mother 67  . Lung cancer Father 60       lung, smoker  . Congestive Heart Failure Sister   . Asthma Sister   . Atrial fibrillation Sister   . Hypertension Sister   . Diabetes Sister   . Head & neck cancer Paternal Grandmother   . Cancer Maternal Uncle        type unk  . Pancreatic cancer Paternal Aunt        dx >50  . Cancer Paternal Uncle        type unk  . Breast cancer Cousin 57  . Prostate cancer Other 62       had robotic surgery    Social History   Socioeconomic History  . Marital status: Widowed    Spouse name: Not on file  . Number of children: Not on file  . Years of education: Not on file  . Highest education level: Not on file  Occupational History  . Not on file  Social Needs  . Financial resource strain: Not on file  . Food insecurity:    Worry: Not on file    Inability: Not on file  . Transportation needs:    Medical: Not on file    Non-medical: Not on file  Tobacco Use  . Smoking status: Never Smoker  . Smokeless tobacco: Never Used  Substance and Sexual Activity  . Alcohol use: No    Alcohol/week: 0.0 oz    Frequency: Never    Comment: occasional glass of wine   . Drug use: No  . Sexual activity: Never  Lifestyle  . Physical activity:    Days per week: Not on file    Minutes per session:  Not on file  . Stress: Not on file  Relationships  . Social connections:    Talks on phone: Not on file    Gets together: Not on file    Attends religious service: Not on file    Active member of club or organization: Not on file    Attends meetings of clubs or organizations: Not on file    Relationship status: Not on file  . Intimate partner violence:    Fear of current or ex partner: Not on file     Emotionally abused: Not on file    Physically abused: Not on file    Forced sexual activity: Not on file  Other Topics Concern  . Not on file  Social History Narrative  . Not on file    Review of Systems: All systems reviewed and negative except where noted in HPI.  Physical Exam: Vital signs in last 24 hours: Temp:  [97.8 F (36.6 C)-98.9 F (37.2 C)] 98.9 F (37.2 C) (07/14 1257) Pulse Rate:  [64-80] 80 (07/14 1257) Resp:  [15-20] 16 (07/14 1257) BP: (109-144)/(59-93) 138/73 (07/14 1257) SpO2:  [95 %-100 %] 100 % (07/14 1257) Weight:  [147 lb (66.7 kg)-165 lb (74.8 kg)] 147 lb (66.7 kg) (07/13 1443) Last BM Date: 04/21/18 General:   Alert, well-developed,  female in NAD Psych:  Pleasant, cooperative. Normal mood and affect. Eyes:  Pupils equal, sclera clear, no icterus.   Conjunctiva pink. Ears:  Normal auditory acuity. Nose:  No deformity, discharge,  or lesions. Neck:  Supple; no masses Lungs:  Clear throughout to auscultation.   No wheezes, crackles, or rhonchi.  Heart:  Regular rate and rhythm; no murmurs, no edema Abdomen:  Soft, non-distended, nontender, BS active, no palp mass    Rectal:  Deferred  Msk:  Symmetrical without gross deformities. . Neurologic:  Alert and  oriented x4;  grossly normal neurologically. Skin:  Intact without significant lesions or rashes..   Intake/Output from previous day: 07/13 0701 - 07/14 0700 In: 798.3 [I.V.:798.3] Out: -  Intake/Output this shift: Total I/O In: -  Out: 200 [Urine:200]  Lab Results: Recent Labs    04/22/18 1454 04/23/18 0345  WBC 4.9 5.0  HGB 8.4* 7.4*  HCT 25.3* 23.3*  PLT 200 187   BMET Recent Labs    04/22/18 1454 04/23/18 0345  NA 141 147*  K 2.9* 3.6  CL 110 121*  CO2 24 22  GLUCOSE 104* 104*  BUN 5* <5*  CREATININE 0.57 0.63  CALCIUM 8.1* 8.1*     Studies/Results: Ct Head Wo Contrast  Result Date: 04/22/2018 CLINICAL DATA:  82 year old female with history of cancer  presenting with near syncopal episode. EXAM: CT HEAD WITHOUT CONTRAST TECHNIQUE: Contiguous axial images were obtained from the base of the skull through the vertex without intravenous contrast. COMPARISON:  None. FINDINGS: Brain: No evidence of acute infarction, hemorrhage, hydrocephalus, extra-axial collection or mass lesion/mass effect. Vascular: No hyperdense vessel or unexpected calcification. Skull: Normal. Negative for fracture or focal lesion. Sinuses/Orbits: No acute finding. Other: None. IMPRESSION: 1. No acute intracranial abnormalities. The appearance of the brain is normal. Electronically Signed   By: Vinnie Langton M.D.   On: 04/22/2018 15:52     Tye Savoy, NP-C @  04/23/2018, 1:50 PM   Attending physician's note   I have taken a history, examined the patient and reviewed the chart. I agree with the Advanced Practitioner's note, impression and recommendations. 21 yr F recently diagnosed with stage III  pancreatic Ca s/p Whipple procedure March 2019, PE on Xarelto since May 2019, s/p port placement on Chemotherapy for past 3-4 weeks admitted after syncopal episode CT head negative for acute intra cranial abnormalities Echo with EF 55-60% Anemic with Hgb 7.4, heme occult positive No overt GI bleed Plan for EGD tomorrow to evaluate any upper GI source of bleeding. ?anastomotic ulcer. Xarelto currently on hold , recent PE and has no evidence of overt GI bleeding, No contraindication from GI standpoint to start heparin gtt and recommend holding it 4 hours prior to the procedure. Will leave it upto the primary team to manage anticoagulation NPO after midnight The risks and benefits as well as alternatives of endoscopic procedure(s) have been discussed and reviewed. All questions answered. The patient agrees to proceed.   Damaris Hippo , MD [phone]

## 2018-04-23 NOTE — Care Management Obs Status (Signed)
[facility] NOTIFICATION   Patient Details  Name: CHARLI HALLE MRN: [id number] Date of Birth: January 23, 1936   Medicare Observation Status Notification Given:  Yes    MahabirJuliann Pulse, RN 04/23/2018, 4:56 PM

## 2018-04-23 NOTE — Progress Notes (Signed)
PROGRESS NOTE    Lauren Richard  KGU:[id number] DOB: [birth year] DOA: 04/22/2018 PCP: Glendale Chard, MD   Brief Narrative: Lauren Richard is a 82 y.o. female with a history of metastatic pancreatic cancer to liver status post chemo and Whipple, hypertension, pulmonary embolism on Xarelto.  She presented secondary to near syncope likely related to orthostatic hypotension.  Started on IV fluids which is in improved symptoms.  Developed acute on chronic anemia with fecal occult positive blood.  GI has been consulted for evaluation and management.   Assessment & Plan:   Principal Problem:   Syncope, near Active Problems:   Acute pulmonary embolism (HCC)   Pancreatic cancer metastasized to liver [facility])   Near syncope Appears secondary to orthostatic hypotension in setting of anemia. Telemetry unremarkable.  Acute blood loss anemia Appears secondary to GI bleeding.  Patient has not noticed any hematochezia or melena.  FOBT was positive.  Drop of hemoglobin overnight which may be complicated by IV fluids. -Repeat CBC this afternoon and transfuse for hemoglobin less than 7 -Hold Xarelto -Continue Protonix  Pulmonary embolism Acute from 2 months ago.  Patient is on Xarelto as an outpatient.  Metastatic pancreatic cancer Patient has metastatic disease to liver.  She is status post Whipple procedure.  Port placed earlier this month by general surgery.  Currently on chemotherapy and sees Dr. Burr Medico as an outpatient  Essential hypertension Well controlled -Continue to hold antihypertensives  Hypokalemia Resolved with supplementation  Hypernatremia In setting of dehydration with and initiation of IV fluids -Switch to D5 1/2 NS  Decreased appetite Started on Megace   DVT prophylaxis: SCDs Code Status:   Code Status: Full Code Family Communication: None at bedside Disposition Plan: Discharge pending GI workup   Consultants:   Gastroenterology  Procedures:    None  Antimicrobials:  None    Subjective: No issues today. No chest pain or dyspnea.  Objective: Vitals:   04/22/18 1825 04/22/18 2038 04/23/18 0438 04/23/18 1257  BP: 126/65 119/65 118/63 138/73  Pulse: 66 80 79 80  Resp: 16 16 18 16   Temp:  98.7 F (37.1 C) 98.9 F (37.2 C) 98.9 F (37.2 C)  TempSrc:    Oral  SpO2: 99% 100% 98% 100%  Weight:      Height:        Intake/Output Summary (Last 24 hours) at 04/23/2018 1351 Last data filed at 04/23/2018 1231 Gross per 24 hour  Intake 798.33 ml  Output 200 ml  Net 598.33 ml   Filed Weights   04/22/18 1413 04/22/18 1443  Weight: 74.8 kg (165 lb) 66.7 kg (147 lb)    Examination:  General exam: Appears calm and comfortable  Respiratory system: Clear to auscultation. Respiratory effort normal. Cardiovascular system: S1 & S2 heard, RRR. No murmurs, rubs, gallops or clicks. Gastrointestinal system: Abdomen is nondistended, soft and nontender. Normal bowel sounds heard. Central nervous system: Alert and oriented. No focal neurological deficits. Extremities: No edema. No calf tenderness Skin: No cyanosis. No rashes Psychiatry: Judgement and insight appear normal. Mood & affect appropriate.     Data Reviewed: I have personally reviewed following labs and imaging studies  CBC: Recent Labs  Lab 04/20/18 0846 04/22/18 1454 04/23/18 0345  WBC 4.0 4.9 5.0  NEUTROABS 2.3  --   --   HGB 8.3* 8.4* 7.4*  HCT 24.9* 25.3* 23.3*  MCV 96.1 96.9 98.7  PLT 228 200 628   Basic Metabolic Panel: Recent Labs  Lab 04/20/18 0846 04/22/18 1454  04/23/18 0345  NA 139 141 147*  K 3.3* 2.9* 3.6  CL 109 110 121*  CO2 23 24 22   GLUCOSE 92 104* 104*  BUN 7* 5* <5*  CREATININE 0.69 0.57 0.63  CALCIUM 8.7* 8.1* 8.1*  MG  --  1.4*  --    GFR: Estimated Creatinine Clearance: 54.5 mL/min (by C-G formula based on SCr of 0.63 mg/dL). Liver Function Tests: Recent Labs  Lab 04/20/18 0846  AST 91*  ALT 46*  ALKPHOS 170*   BILITOT 0.7  PROT 6.7  ALBUMIN 2.4*   No results for input(s): LIPASE, AMYLASE in the last 168 hours. No results for input(s): AMMONIA in the last 168 hours. Coagulation Profile: No results for input(s): INR, PROTIME in the last 168 hours. Cardiac Enzymes: No results for input(s): CKTOTAL, CKMB, CKMBINDEX, TROPONINI in the last 168 hours. BNP (last 3 results) No results for input(s): PROBNP in the last 8760 hours. HbA1C: No results for input(s): HGBA1C in the last 72 hours. CBG: Recent Labs  Lab 04/22/18 1441  GLUCAP 98   Lipid Profile: No results for input(s): CHOL, HDL, LDLCALC, TRIG, CHOLHDL, LDLDIRECT in the last 72 hours. Thyroid Function Tests: No results for input(s): TSH, T4TOTAL, FREET4, T3FREE, THYROIDAB in the last 72 hours. Anemia Panel: No results for input(s): VITAMINB12, FOLATE, FERRITIN, TIBC, IRON, RETICCTPCT in the last 72 hours. Sepsis Labs: No results for input(s): PROCALCITON, LATICACIDVEN in the last 168 hours.  No results found for this or any previous visit (from the past 240 hour(s)).       Radiology Studies: Ct Head Wo Contrast  Result Date: 04/22/2018 CLINICAL DATA:  82 year old female with history of cancer presenting with near syncopal episode. EXAM: CT HEAD WITHOUT CONTRAST TECHNIQUE: Contiguous axial images were obtained from the base of the skull through the vertex without intravenous contrast. COMPARISON:  None. FINDINGS: Brain: No evidence of acute infarction, hemorrhage, hydrocephalus, extra-axial collection or mass lesion/mass effect. Vascular: No hyperdense vessel or unexpected calcification. Skull: Normal. Negative for fracture or focal lesion. Sinuses/Orbits: No acute finding. Other: None. IMPRESSION: 1. No acute intracranial abnormalities. The appearance of the brain is normal. Electronically Signed   By: Vinnie Langton M.D.   On: 04/22/2018 15:52        Scheduled Meds: . feeding supplement (ENSURE ENLIVE)  237 mL Oral BID BM   . mirtazapine  15 mg Oral QHS  . nystatin  5 mL Oral QID  . pantoprazole  40 mg Oral Daily  . sodium chloride flush  3 mL Intravenous Q12H   Continuous Infusions: . sodium chloride    . dextrose 5 % and 0.9 % NaCl with KCl 40 mEq/L 100 mL/hr at 04/23/18 0759     LOS: 0 days     Cordelia Poche, MD [facility] 04/23/2018, 1:51 PM Pager: [phone]  If 7PM-7AM, please contact night-coverage [email or web address] 04/23/2018, 1:51 PM

## 2018-04-23 NOTE — Progress Notes (Signed)
  Echocardiogram 2D Echocardiogram has been performed.  Cullin Dishman L Androw 04/23/2018, 12:13 PM

## 2018-04-23 NOTE — Plan of Care (Signed)
VSS, patient denies pain or other issue this shift.  Patient without any dizziness or further syncopal episodes this shift, verbalizes understanding that she is nothing by mouth after midnight for EGD 04/24/18.  Signed consent on chart.  Multiple friends and family in to visit this shift.

## 2018-04-24 ENCOUNTER — Inpatient Hospital Stay (HOSPITAL_COMMUNITY): Payer: PPO | Admitting: Anesthesiology

## 2018-04-24 ENCOUNTER — Inpatient Hospital Stay (HOSPITAL_COMMUNITY)
Admit: 2018-04-24 | Discharge: 2018-04-24 | Disposition: A | Discharge status: Home / Self Care | Payer: PPO | Attending: Internal Medicine | Admitting: Internal Medicine

## 2018-04-24 ENCOUNTER — Encounter (HOSPITAL_COMMUNITY): Payer: Self-pay | Admitting: *Deleted

## 2018-04-24 ENCOUNTER — Encounter (HOSPITAL_COMMUNITY)
Admission: EM | Disposition: A | Discharge status: Home Health | Payer: Self-pay | Source: Home / Self Care | Attending: Family Medicine

## 2018-04-24 DIAGNOSIS — C250 Malignant neoplasm of head of pancreas: Secondary | ICD-10-CM

## 2018-04-24 DIAGNOSIS — I959 Hypotension, unspecified: Secondary | ICD-10-CM

## 2018-04-24 DIAGNOSIS — R195 Other fecal abnormalities: Secondary | ICD-10-CM

## 2018-04-24 DIAGNOSIS — C799 Secondary malignant neoplasm of unspecified site: Secondary | ICD-10-CM

## 2018-04-24 DIAGNOSIS — C259 Malignant neoplasm of pancreas, unspecified: Secondary | ICD-10-CM

## 2018-04-24 DIAGNOSIS — E860 Dehydration: Secondary | ICD-10-CM

## 2018-04-24 HISTORY — PX: ESOPHAGOGASTRODUODENOSCOPY: SHX5428

## 2018-04-24 LAB — CBC
HEMATOCRIT: 23.4 % — AB (ref 36.0–46.0)
HEMOGLOBIN: 7.5 g/dL — AB (ref 12.0–15.0)
MCH: 32.1 pg (ref 26.0–34.0)
MCHC: 32.1 g/dL (ref 30.0–36.0)
MCV: 100 fL (ref 78.0–100.0)
PLATELETS: 179 10*3/uL (ref 150–400)
RBC: 2.34 MIL/uL — AB (ref 3.87–5.11)
RDW: 22.7 % — ABNORMAL HIGH (ref 11.5–15.5)
WBC: 4.8 10*3/uL (ref 4.0–10.5)

## 2018-04-24 LAB — BASIC METABOLIC PANEL
Anion gap: 5 (ref 5–15)
CO2: 21 mmol/L — AB (ref 22–32)
CREATININE: 0.61 mg/dL (ref 0.44–1.00)
Calcium: 8.1 mg/dL — ABNORMAL LOW (ref 8.9–10.3)
Chloride: 111 mmol/L (ref 98–111)
GFR calc non Af Amer: >60 mL/min (ref >60)
GLUCOSE: 90 mg/dL (ref 70–99)
Potassium: 3.4 mmol/L — ABNORMAL LOW (ref 3.5–5.1)
Sodium: 137 mmol/L (ref 135–145)

## 2018-04-24 SURGERY — EGD (ESOPHAGOGASTRODUODENOSCOPY)
Anesthesia: Monitor Anesthesia Care

## 2018-04-24 MED ORDER — PROPOFOL 500 MG/50ML IV EMUL
INTRAVENOUS | Status: DC | PRN
  Administered 2018-04-24: 100 ug/kg/min via INTRAVENOUS

## 2018-04-24 MED ORDER — LACTATED RINGERS IV SOLN
INTRAVENOUS | Status: DC
  Administered 2018-04-24: 11:00:00 via INTRAVENOUS

## 2018-04-24 MED ORDER — PROPOFOL 10 MG/ML IV BOLUS
INTRAVENOUS | Status: DC | PRN
  Administered 2018-04-24: 20 mg via INTRAVENOUS

## 2018-04-24 MED ORDER — PROPOFOL 10 MG/ML IV BOLUS
INTRAVENOUS | Status: AC
  Filled 2018-04-24: qty 40

## 2018-04-24 MED ORDER — RIVAROXABAN 20 MG PO TABS
20.0000 mg | ORAL_TABLET | Freq: Every day | ORAL | Status: DC
  Administered 2018-04-24: 20 mg via ORAL
  Filled 2018-04-24: qty 1

## 2018-04-24 MED ORDER — LIDOCAINE 2% (20 MG/ML) 5 ML SYRINGE
INTRAMUSCULAR | Status: DC | PRN
  Administered 2018-04-24: 100 mg via INTRAVENOUS

## 2018-04-24 NOTE — Anesthesia Preprocedure Evaluation (Signed)
Anesthesia Evaluation  Patient identified by MRN, date of birth, ID band Patient awake    Reviewed: Allergy & Precautions, NPO status , Patient's Chart, lab work & pertinent test results  Airway Mallampati: II  TM Distance: >3 FB Neck ROM: Full    Dental no notable dental hx.    Pulmonary neg pulmonary ROS,    Pulmonary exam normal breath sounds clear to auscultation       Cardiovascular hypertension, Normal cardiovascular exam Rhythm:Regular Rate:Normal  Left ventricle: The cavity size was normal. Wall thickness was   normal. Systolic function was normal. The estimated ejection   fraction was in the range of 55% to 60%. Wall motion was normal;   there were no regional wall motion abnormalities. Left   ventricular diastolic function parameters were normal. - Aortic valve: Mildly calcified annulus. Trileaflet. There was   mild regurgitation. - Mitral valve: There was trivial regurgitation. - Right atrium: Central venous pressure (est): 3 mm Hg. - Tricuspid valve: There was mild regurgitation. - Pulmonary arteries: PA peak pressure: 36 mm Hg (S). - Pericardium, extracardiac: There was no pericardial effusion.    Neuro/Psych negative neurological ROS  negative psych ROS   GI/Hepatic negative GI ROS, Neg liver ROS,   Endo/Other  Hypothyroidism   Renal/GU negative Renal ROS  negative genitourinary   Musculoskeletal negative musculoskeletal ROS (+)   Abdominal   Peds negative pediatric ROS (+)  Hematology  (+) anemia ,   Anesthesia Other Findings   Reproductive/Obstetrics negative OB ROS                             Anesthesia Physical Anesthesia Plan  ASA: III  Anesthesia Plan: MAC   Post-op Pain Management:    Induction: Intravenous  PONV Risk Score and Plan:   Airway Management Planned: Simple Face Mask  Additional Equipment:   Intra-op Plan:   Post-operative Plan:    Informed Consent: I have reviewed the patients History and Physical, chart, labs and discussed the procedure including the risks, benefits and alternatives for the proposed anesthesia with the patient or authorized representative who has indicated his/her understanding and acceptance.   Dental advisory given  Plan Discussed with: CRNA and Surgeon  Anesthesia Plan Comments:         Anesthesia Quick Evaluation

## 2018-04-24 NOTE — Progress Notes (Signed)
EEG complete - results pending 

## 2018-04-24 NOTE — Procedures (Signed)
ELECTROENCEPHALOGRAM REPORT   Patient: Lauren Richard       Room #: Endo EEG No. ID: [id number] Age: 82 y.o.        Sex: female Referring Physician: Rizwan Report Date:  04/24/2018        Interpreting Physician: Alexis Goodell  History: Lauren Richard is an 82 y.o. female presenting with syncope evaluated to rule out seizure  Medications:  Remeron, Protonix  Conditions of Recording:  This is a 16 channel EEG carried out with the patient in the awake, drowsy and asleep states.  Description:  The waking background activity consists of a low voltage, symmetrical, fairly well organized, 10-11 Hz alpha activity, seen from the parieto-occipital and posterior temporal regions.  Low voltage fast activity, poorly organized, is seen anteriorly and is at times superimposed on more posterior regions.  Also noted intermittently are periods of bifrontal slowing consisting of polymorphic delta activity.  A mixture of theta and alpha rhythms are seen from the central and temporal regions. The patient drowses with slowing to irregular, low voltage theta and beta activity.   The patient goes in to a light sleep with symmetrical sleep spindles, vertex central sharp transients and irregular slow activity.   No epileptiform activity is noted.   Hyperventilation was not performed.  Intermittent photic stimulation was performed but failed to illicit any change in the tracing.   IMPRESSION: Normal electroencephalogram, awake, asleep and with activation procedures. There are no focal lateralizing or epileptiform features. Bifrontal slowing noted intermittently likely secondary to medications.   Alexis Goodell, MD Neurology [phone] 04/24/2018, 11:09 AM

## 2018-04-24 NOTE — Telephone Encounter (Signed)
Please place Xeloda on hold for now, I will see her in the hospital. Thanks.   Truitt Merle MD

## 2018-04-24 NOTE — Op Note (Signed)
[facility] Patient Name: Lauren Richard Procedure Date: 04/24/2018 MRN: [id number] Attending MD: Jerene Bears , MD Date of Birth: 1936/06/01 CSN: [id number] Age: 82 Admit Type: Inpatient Procedure:                Upper GI endoscopy Indications:              Heme positive stool, acute on chronic anemia,                            history of pancreatic cancer s/p Whipple procedure Providers:                Lajuan Lines. Hilarie Fredrickson, MD, Elmer Ramp. Tilden Dome, RN, Charolette Child, Technician, Dione Booze, CRNA Referring MD:             [facility] Medicines:                Monitored Anesthesia Care Complications:            No immediate complications. Estimated Blood Loss:     Estimated blood loss: none. Procedure:                Pre-Anesthesia Assessment:                           - Prior to the procedure, a History and Physical                            was performed, and patient medications and                            allergies were reviewed. The patient's tolerance of                            previous anesthesia was also reviewed. The risks                            and benefits of the procedure and the sedation                            options and risks were discussed with the patient.                            All questions were answered, and informed consent                            was obtained. Prior Anticoagulants: The patient has                            taken Xarelto (rivaroxaban), last dose was 2 days                            prior to procedure. ASA Grade Assessment: III - A  patient with severe systemic disease. After                            reviewing the risks and benefits, the patient was                            deemed in satisfactory condition to undergo the                            procedure.                           After obtaining informed consent, the endoscope was                  passed under direct vision. Throughout the                            procedure, the patient's blood pressure, pulse, and                            oxygen saturations were monitored continuously. The                            [id number] ([id number]) scope was introduced through the                            mouth, and advanced to the second part of duodenum.                            The upper GI endoscopy was accomplished without                            difficulty. The patient tolerated the procedure                            well. Scope In: Scope Out: Findings:      The examined esophagus was normal.      The cardia and gastric fundus were normal on retroflexion.      A small amount of food (residue) was found in the gastric body.      Evidence of a gastrojejunostomy (Whipple anatomy) was found in the       gastric body. This was characterized by healthy appearing mucosa with       widely patent anastomosis.      The examined jejunum was normal. Impression:               - Normal esophagus.                           - A small amount of food (residue) in the stomach.                           - A gastrojejunostomy was found, characterized by  healthy appearing mucosa.                           - Normal examined jejunum.                           - No evidence of upper GI bleeding source.                           - No specimens collected. Moderate Sedation:      N/A Recommendation:           - Return patient to hospital ward for ongoing care.                           - Resume previous diet.                           - Continue present medications. Resuming                            anticoagulation (Xarelto) per prescribing MD. Will                            defer this decision to oncology. Will need close                            attention to Hgb and transfusion as needed.                           - No plans for colonoscopy at this  time.                           - GI will sign off (contact Dr. Benson Norway if questions). Procedure Code(s):        --- Professional ---                           [phone], Esophagogastroduodenoscopy, flexible,                            transoral; diagnostic, including collection of                            specimen(s) by brushing or washing, when performed                            (separate procedure) Diagnosis Code(s):        --- Professional ---                           Z98.0, Intestinal bypass and anastomosis status                           R19.5, Other fecal abnormalities CPT copyright 2017 American Medical Association. All rights reserved. The codes documented in this report are preliminary and upon coder review may  be revised to meet current compliance requirements. [name]  [name], MD 04/24/2018 12:21:03 PM This report has been signed electronically. Number of Addenda: 0

## 2018-04-24 NOTE — Care Management Note (Signed)
Case Management Note  Patient Details  Name: Lauren Richard MRN: [id number] Date of Birth: 1936/01/22  Subjective/Objective:    Syncope           Action/Plan: Plan to discharge home with [facility]   Expected Discharge Date:                  Expected Discharge Plan:  [place]  In-House Referral:     Discharge planning Services  CM Consult  Post Acute Care Choice:    Choice offered to:  Patient  DME Arranged:    DME Agency:     HH Arranged:  PT [place]:  [employer]  Status of Service:  In process, will continue to follow  If discussed at Long Length of Stay Meetings, dates discussed:    Additional CommentsPurcell Mouton, RN 04/24/2018, 2:32 PM

## 2018-04-24 NOTE — Anesthesia Procedure Notes (Signed)
Procedure Name: MAC Date/Time: 04/24/2018 11:51 AM Performed by: Dione Booze, CRNA Pre-anesthesia Checklist: Patient identified, Emergency Drugs available, Suction available and Patient being monitored Patient Re-evaluated:Patient Re-evaluated prior to induction Oxygen Delivery Method: Nasal cannula Placement Confirmation: positive ETCO2

## 2018-04-24 NOTE — Transfer of Care (Signed)
Immediate Anesthesia Transfer of Care Note  Patient: Lauren Richard  Procedure(s) Performed: ESOPHAGOGASTRODUODENOSCOPY (EGD) (N/A )  Patient Location: PACU and Endoscopy Unit  Anesthesia Type:MAC  Level of Consciousness: awake and patient cooperative  Airway & Oxygen Therapy: Patient Spontanous Breathing and Patient connected to nasal cannula oxygen  Post-op Assessment: Report given to RN and Post -op Vital signs reviewed and stable  Post vital signs: Reviewed and stable  Last Vitals:  Vitals Value Taken Time  BP    Temp    Pulse 87 04/24/2018 12:09 PM  Resp 29 04/24/2018 12:09 PM  SpO2 99 % 04/24/2018 12:09 PM  Vitals shown include unvalidated device data.  Last Pain:  Vitals:   04/24/18 1054  TempSrc: Oral  PainSc: 0-No pain         Complications: No apparent anesthesia complications

## 2018-04-24 NOTE — Telephone Encounter (Signed)
Oral Oncology Pharmacist Encounter  Insurance authorization for Xeloda is not required  Test claim at the [employer] revealed copayment $231.12 per 21-day supply at current dosing  There is copayment assistance available for pancreatic cancer through the cancer care foundation Oral oncology clinic will work with patient to determine if copayment assistance is needed, and if patient is eligible  Noted patient currently admitted since 04/22/2018 with symptomatic anemia and FOBT positive Patient scheduled for EGD today to evaluate source of bleeding Xarelto has been placed on hold  I will wait and follow-up with patient tomorrow (04/25/2018) to determine eligibility for copayment assistance and provide initial counseling for the Xeloda.  I will also follow-up with MD about plan Xeloda start date of 04/28/2018.  Johny Drilling, PharmD, BCPS, BCOP 04/24/2018 11:24 AM Oral Oncology Clinic [phone]

## 2018-04-24 NOTE — Progress Notes (Signed)
PROGRESS NOTE    Lauren Richard  JXB:[id number] DOB: 05/17/1936 DOA: 04/22/2018 PCP: Glendale Chard, MD   Brief Narrative: Lauren Richard is a 82 y.o. female with a history of metastatic pancreatic cancer to liver status post chemo and Whipple, hypertension, pulmonary embolism on Xarelto.  She presented secondary to near syncope likely related to orthostatic hypotension.  Started on IV fluids which is in improved symptoms.  Developed acute on chronic anemia with fecal occult positive blood.  GI has been consulted for evaluation and management.   Assessment & Plan:   Principal Problem:   Syncope, near Active Problems:   Acute pulmonary embolism (HCC)   Pancreatic cancer metastasized to liver [facility])   Syncope   Near syncope Appears secondary to orthostatic hypotension in setting of anemia. Telemetry unremarkable. EEG unremarkable.  Acute blood loss anemia Appears secondary to GI bleeding.  Patient has not noticed any hematochezia or melena.  FOBT was positive. Hemoglobin stable. No bleeding overnight. -Hold Xarelto -Continue Protonix -GI recommendations: EGD today  Pulmonary embolism Acute from 2 months ago.  Patient is on Xarelto as an outpatient. -Heparin drip per GI recommendations  Metastatic pancreatic cancer Patient has metastatic disease to liver.  She is status post Whipple procedure.  Port placed earlier this month by general surgery.  Currently on chemotherapy and sees Dr. Burr Medico as an outpatient  Essential hypertension Well controlled -Continue to hold antihypertensives  Hypokalemia Resolved with supplementation  Hypernatremia In setting of dehydration with and initiation of IV fluids -Continue D5 1/2 NS  Decreased appetite Started on Megace. Switched to increased dose of Remeron.   DVT prophylaxis: SCDs Code Status:   Code Status: Full Code Family Communication: None at bedside Disposition Plan: Discharge pending GI workup   Consultants:    Gastroenterology  Procedures:   None  Antimicrobials:  None    Subjective: Bowel movement overnight with no blood or melena  Objective: Vitals:   04/24/18 1011 04/24/18 1013 04/24/18 1015 04/24/18 1054  BP: 127/78 121/83 135/76 (!) 143/89  Pulse: (!) 105 (!) 126 96 84  Resp:    20  Temp: 98.9 F (37.2 C)   98.6 F (37 C)  TempSrc: Oral   Oral  SpO2: 100% 100%  100%  Weight:    66.7 kg (147 lb)  Height:    5\' 7"  (1.702 m)    Intake/Output Summary (Last 24 hours) at 04/24/2018 1137 Last data filed at 04/24/2018 0600 Gross per 24 hour  Intake 2166.67 ml  Output 200 ml  Net 1966.67 ml   Filed Weights   04/22/18 1413 04/22/18 1443 04/24/18 1054  Weight: 74.8 kg (165 lb) 66.7 kg (147 lb) 66.7 kg (147 lb)    Examination:  General exam: Appears calm and comfortable Respiratory system: Clear to auscultation. Respiratory effort normal. Cardiovascular system: S1 & S2 heard, RRR. No murmurs, rubs, gallops or clicks. Gastrointestinal system: Abdomen is nondistended, soft and nontender. Normal bowel sounds heard. Central nervous system: Alert and oriented. No focal neurological deficits. Extremities: No edema. No calf tenderness Skin: No cyanosis. No rashes Psychiatry: Judgement and insight appear normal. Mood & affect appropriate.     Data Reviewed: I have personally reviewed following labs and imaging studies  CBC: Recent Labs  Lab 04/20/18 0846 04/22/18 1454 04/23/18 0345 04/23/18 1620 04/24/18 0932  WBC 4.0 4.9 5.0 4.8 4.8  NEUTROABS 2.3  --   --   --   --   HGB 8.3* 8.4* 7.4* 7.7* 7.5*  HCT  24.9* 25.3* 23.3* 23.9* 23.4*  MCV 96.1 96.9 98.7 100.4* 100.0  PLT 228 200 187 198 786   Basic Metabolic Panel: Recent Labs  Lab 04/20/18 0846 04/22/18 1454 04/23/18 0345 04/24/18 0932  NA 139 141 147* 137  K 3.3* 2.9* 3.6 3.4*  CL 109 110 121* 111  CO2 23 24 22  21*  GLUCOSE 92 104* 104* 90  BUN 7* 5* <5* <5*  CREATININE 0.69 0.57 0.63 0.61  CALCIUM  8.7* 8.1* 8.1* 8.1*  MG  --  1.4*  --   --    GFR: Estimated Creatinine Clearance: 54.5 mL/min (by C-G formula based on SCr of 0.61 mg/dL). Liver Function Tests: Recent Labs  Lab 04/20/18 0846  AST 91*  ALT 46*  ALKPHOS 170*  BILITOT 0.7  PROT 6.7  ALBUMIN 2.4*   No results for input(s): LIPASE, AMYLASE in the last 168 hours. No results for input(s): AMMONIA in the last 168 hours. Coagulation Profile: No results for input(s): INR, PROTIME in the last 168 hours. Cardiac Enzymes: No results for input(s): CKTOTAL, CKMB, CKMBINDEX, TROPONINI in the last 168 hours. BNP (last 3 results) No results for input(s): PROBNP in the last 8760 hours. HbA1C: No results for input(s): HGBA1C in the last 72 hours. CBG: Recent Labs  Lab 04/22/18 1441  GLUCAP 98   Lipid Profile: No results for input(s): CHOL, HDL, LDLCALC, TRIG, CHOLHDL, LDLDIRECT in the last 72 hours. Thyroid Function Tests: No results for input(s): TSH, T4TOTAL, FREET4, T3FREE, THYROIDAB in the last 72 hours. Anemia Panel: No results for input(s): VITAMINB12, FOLATE, FERRITIN, TIBC, IRON, RETICCTPCT in the last 72 hours. Sepsis Labs: No results for input(s): PROCALCITON, LATICACIDVEN in the last 168 hours.  No results found for this or any previous visit (from the past 240 hour(s)).       Radiology Studies: Ct Head Wo Contrast  Result Date: 04/22/2018 CLINICAL DATA:  82 year old female with history of cancer presenting with near syncopal episode. EXAM: CT HEAD WITHOUT CONTRAST TECHNIQUE: Contiguous axial images were obtained from the base of the skull through the vertex without intravenous contrast. COMPARISON:  None. FINDINGS: Brain: No evidence of acute infarction, hemorrhage, hydrocephalus, extra-axial collection or mass lesion/mass effect. Vascular: No hyperdense vessel or unexpected calcification. Skull: Normal. Negative for fracture or focal lesion. Sinuses/Orbits: No acute finding. Other: None. IMPRESSION: 1. No  acute intracranial abnormalities. The appearance of the brain is normal. Electronically Signed   By: Vinnie Langton M.D.   On: 04/22/2018 15:52        Scheduled Meds: . [MAR Hold] alteplase  2 mg Intracatheter Once  . [MAR Hold] feeding supplement (ENSURE ENLIVE)  237 mL Oral BID BM  . [MAR Hold] mirtazapine  15 mg Oral QHS  . [MAR Hold] nystatin  5 mL Oral QID  . [MAR Hold] pantoprazole  40 mg Oral Daily  . [MAR Hold] sodium chloride flush  3 mL Intravenous Q12H   Continuous Infusions: . [MAR Hold] sodium chloride    . dextrose 5 % and 0.45% NaCl 100 mL/hr at 04/24/18 0404  . lactated ringers 20 mL/hr at 04/24/18 1056     LOS: 1 day     Cordelia Poche, MD [facility] 04/24/2018, 11:37 AM Pager: [phone]  If 7PM-7AM, please contact night-coverage [email or web address] 04/24/2018, 11:37 AM

## 2018-04-24 NOTE — Interval H&P Note (Signed)
History and Physical Interval Note: For upper endoscopy today to evaluate heme positive stools and anemia; in the setting of history of pancreas cancer status post Whipple procedure No overt bleeding though stool was heme positive Suspicion that chemotherapy playing a part in anemia Hemoglobin remained stable in the mid 7 g/dL range She remains off Xarelto for now  The nature of the procedure, as well as the risks, benefits, and alternatives were carefully and thoroughly reviewed with the patient. Ample time for discussion and questions allowed. The patient understood, was satisfied, and agreed to proceed.     04/24/2018 11:43 AM  Lauren Richard  has presented today for surgery, with the diagnosis of anemia, heme positive stool  The various methods of treatment have been discussed with the patient and family. After consideration of risks, benefits and other options for treatment, the patient has consented to  Procedure(s): ESOPHAGOGASTRODUODENOSCOPY (EGD) (N/A) as a surgical intervention .  The patient's history has been reviewed, patient examined, no change in status, stable for surgery.  I have reviewed the patient's chart and labs.  Questions were answered to the patient's satisfaction.     Lajuan Lines Deadrian Toya

## 2018-04-24 NOTE — Anesthesia Postprocedure Evaluation (Signed)
Anesthesia Post Note  Patient: Lauren Richard  Procedure(s) Performed: ESOPHAGOGASTRODUODENOSCOPY (EGD) (N/A )     Patient location during evaluation: PACU Anesthesia Type: MAC Level of consciousness: awake and alert Pain management: pain level controlled Vital Signs Assessment: post-procedure vital signs reviewed and stable Respiratory status: spontaneous breathing, nonlabored ventilation, respiratory function stable and patient connected to nasal cannula oxygen Cardiovascular status: stable and blood pressure returned to baseline Postop Assessment: no apparent nausea or vomiting Anesthetic complications: no    Last Vitals:  Vitals:   04/24/18 1054 04/24/18 1209  BP: (!) 143/89 135/61  Pulse: 84 86  Resp: 20 (!) 24  Temp: 37 C 36.7 C  SpO2: 100% 100%    Last Pain:  Vitals:   04/24/18 1209  TempSrc: Oral  PainSc: 0-No pain                 Libbie Bartley S

## 2018-04-24 NOTE — Progress Notes (Signed)
Lauren Richard   DOB:Mar 01, 1936   DJ#:[id number]   QQI#:[id number]  Oncology f/u note   Subjective: Pt is well know to me, under my care for her metastatic pancreatic cancer. She received last dose chemo gemcitabine on 7/5, and was admitted on 04/22/2018 for a near syncope at home. She was found to be hypotensive and had worsening anemia. She feels better now, ambulated in hallway without any dizziness or other complains.   Objective:  Vitals:   04/24/18 1220 04/24/18 1236  BP: 135/66 119/68  Pulse: 87 83  Resp: (!) 24   Temp:    SpO2: 98% 100%    Body mass index is 23.02 kg/m.  Intake/Output Summary (Last 24 hours) at 04/24/2018 2052 Last data filed at 04/24/2018 1600 Gross per 24 hour  Intake 2890 ml  Output -  Net 2890 ml     Sclerae unicteric  Oropharynx clear  No peripheral adenopathy  Lungs clear -- no rales or rhonchi  Heart regular rate and rhythm  Abdomen benign  MSK no focal spinal tenderness, no peripheral edema  Neuro nonfocal   CBG (last 3)  Recent Labs    04/22/18 1441  GLUCAP 98     Labs:  Lab Results  Component Value Date   WBC 4.8 04/24/2018   HGB 7.5 (L) 04/24/2018   HCT 23.4 (L) 04/24/2018   MCV 100.0 04/24/2018   PLT 179 04/24/2018   NEUTROABS 2.3 04/20/2018    CMP Latest Ref Rng & Units 04/24/2018 04/23/2018 04/22/2018  Glucose 70 - 99 mg/dL 90 104(H) 104(H)  BUN 8 - 23 mg/dL <5(L) <5(L) 5(L)  Creatinine 0.44 - 1.00 mg/dL 0.61 0.63 0.57  Sodium 135 - 145 mmol/L 137 147(H) 141  Potassium 3.5 - 5.1 mmol/L 3.4(L) 3.6 2.9(L)  Chloride 98 - 111 mmol/L 111 121(H) 110  CO2 22 - 32 mmol/L 21(L) 22 24  Calcium 8.9 - 10.3 mg/dL 8.1(L) 8.1(L) 8.1(L)  Total Protein 6.5 - 8.1 g/dL - - -  Total Bilirubin 0.3 - 1.2 mg/dL - - -  Alkaline Phos 38 - 126 U/L - - -  AST 15 - 41 U/L - - -  ALT 0 - 44 U/L - - -     Urine Studies No results for input(s): UHGB, CRYS in the last 72 hours.  Invalid input(s): UACOL, UAPR, USPG, UPH, UTP, UGL, UKET, UBIL,  UNIT, UROB, ULEU, UEPI, UWBC, URBC, Lance Bosch, [place]  Basic Metabolic Panel: Recent Labs  Lab 04/20/18 0846 04/22/18 1454 04/23/18 0345 04/24/18 0932  NA 139 141 147* 137  K 3.3* 2.9* 3.6 3.4*  CL 109 110 121* 111  CO2 23 24 22  21*  GLUCOSE 92 104* 104* 90  BUN 7* 5* <5* <5*  CREATININE 0.69 0.57 0.63 0.61  CALCIUM 8.7* 8.1* 8.1* 8.1*  MG  --  1.4*  --   --    GFR Estimated Creatinine Clearance: 54.5 mL/min (by C-G formula based on SCr of 0.61 mg/dL). Liver Function Tests: Recent Labs  Lab 04/20/18 0846  AST 91*  ALT 46*  ALKPHOS 170*  BILITOT 0.7  PROT 6.7  ALBUMIN 2.4*   No results for input(s): LIPASE, AMYLASE in the last 168 hours. No results for input(s): AMMONIA in the last 168 hours. Coagulation profile No results for input(s): INR, PROTIME in the last 168 hours.  CBC: Recent Labs  Lab 04/20/18 0846 04/22/18 1454 04/23/18 0345 04/23/18 1620 04/24/18 0932  WBC 4.0 4.9 5.0 4.8 4.8  NEUTROABS  2.3  --   --   --   --   HGB 8.3* 8.4* 7.4* 7.7* 7.5*  HCT 24.9* 25.3* 23.3* 23.9* 23.4*  MCV 96.1 96.9 98.7 100.4* 100.0  PLT 228 200 187 198 179   Cardiac Enzymes: No results for input(s): CKTOTAL, CKMB, CKMBINDEX, TROPONINI in the last 168 hours. BNP: Invalid input(s): POCBNP CBG: Recent Labs  Lab 04/22/18 1441  GLUCAP 98   D-Dimer No results for input(s): DDIMER in the last 72 hours. Hgb A1c No results for input(s): HGBA1C in the last 72 hours. Lipid Profile No results for input(s): CHOL, HDL, LDLCALC, TRIG, CHOLHDL, LDLDIRECT in the last 72 hours. Thyroid function studies No results for input(s): TSH, T4TOTAL, T3FREE, THYROIDAB in the last 72 hours.  Invalid input(s): FREET3 Anemia work up No results for input(s): VITAMINB12, FOLATE, FERRITIN, TIBC, IRON, RETICCTPCT in the last 72 hours. Microbiology No results found for this or any previous visit (from the past 240 hour(s)).    Studies:  No results found.  Assessment: 82 y.o.  African-American female with recently diagnosed metastatic pancreatic cancer, PE, presented with profound fatigue, low appetite and diarrhea after chemo.  1.  Hypotension, likely dehydration  2.  Worsening anemia, probably related to chemo  3.  Metastatic pancreatic cancer, status post chemotherapy gemcitabine  4. Mild GI bleeding? EGD (-) 5. History of PE 6. HTN   Plan:  -if cleared by GI, would recommend restarting Xarelto  -please consider blood transfusion 1-2u tomorrow.  -I will hold chemo for now, I am skeptical about her ability to tolerate more chemo, will see how she recovers  -will cancel her f/u and chemo this Thursday and see her back early next week, to discuss chemo vs palliative care alone -I appreciate the excellent care from the hospitalist and GI teams   Truitt Merle, MD 04/24/2018  8:52 PM

## 2018-04-25 ENCOUNTER — Encounter (HOSPITAL_COMMUNITY): Payer: Self-pay | Admitting: Internal Medicine

## 2018-04-25 DIAGNOSIS — E440 Moderate protein-calorie malnutrition: Secondary | ICD-10-CM

## 2018-04-25 LAB — CBC
HEMATOCRIT: 23.4 % — AB (ref 36.0–46.0)
Hemoglobin: 7.6 g/dL — ABNORMAL LOW (ref 12.0–15.0)
MCH: 32.5 pg (ref 26.0–34.0)
MCHC: 32.5 g/dL (ref 30.0–36.0)
MCV: 100 fL (ref 78.0–100.0)
Platelets: 204 10*3/uL (ref 150–400)
RBC: 2.34 MIL/uL — ABNORMAL LOW (ref 3.87–5.11)
RDW: 22.3 % — AB (ref 11.5–15.5)
WBC: 4.4 10*3/uL (ref 4.0–10.5)

## 2018-04-25 MED ORDER — HEPARIN SOD (PORK) LOCK FLUSH 100 UNIT/ML IV SOLN
500.0000 [IU] | INTRAVENOUS | Status: AC | PRN
  Administered 2018-04-25: 500 [IU]

## 2018-04-25 MED ORDER — ENSURE ENLIVE PO LIQD: 237.0000 mL | Freq: Three times a day (TID) | ORAL | 0 refills | Status: AC

## 2018-04-25 MED ORDER — ENSURE ENLIVE PO LIQD: 237.0000 mL | Freq: Three times a day (TID) | ORAL | Status: DC

## 2018-04-25 NOTE — Discharge Instructions (Signed)
Lauren Richard  You were in the hospital because of low blood count. This may be related to your Whipple surgery. You had an EGD which did not show any evidence of bleeding source.

## 2018-04-25 NOTE — Progress Notes (Signed)
Pt discharged to home with niece, reviewed discharged instructions with patient and niece, acknowledged understanding. Home care arranged by Care management. Pt in stable condition and taken out by staff in wheelchair. SRP, RN

## 2018-04-25 NOTE — Consult Note (Addendum)
   [facility] CM Inpatient Consult   04/25/2018  JASMARIE COPPOCK 1936/01/14 [id number]   Patient screened for [place] Management services. She recently signed up for [place] [facility] program. However, she declined [place] [facility] follow up upon [place] outreach. Please see chart review tab then encounters for patient outreach details by [facility] team.   Patient readmitted. Went to bedside to speak with Mrs. Lauren Richard about [place] [facility] program again. She again declines. States I already have someone coming out to the house. However, she does not know the name of the agency. She denies having any current medication, transportation, or community resource, or disease management concerns or needs.   Provided another 24-hr nurse advice line magnet with [facility] Care Management brochure with contact information.  Will make inpatient RNCM aware patient declined [place] [facility].    Marthenia Rolling, MSN-Ed, RN,BSN [facility] Liaison [phone]

## 2018-04-25 NOTE — Progress Notes (Signed)
Pt nauseated and vomited at small amount of yellow emesis, medicated as ordered. SRP,RN
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
## 2018-04-25 NOTE — Progress Notes (Addendum)
Pt states that she has HHRN/PT coming into her home, but do not remember her name. This CM research, could not find Agency. It is not [facility]. There are no needs at present time.

## 2018-04-25 NOTE — Progress Notes (Signed)
Pt's niece came, who states pt was with [facility]. Alvis Lemmings was called pt is active and will need HHRN/PT orders and a face to face  Please.

## 2018-04-25 NOTE — Progress Notes (Signed)
Initial Nutrition Assessment  DOCUMENTATION CODES:   Non-severe (moderate) malnutrition in context of chronic illness  INTERVENTION:   Ensure Enlive po TID, each supplement provides 350 kcal and 20 grams of protein  NUTRITION DIAGNOSIS:   Moderate Malnutrition related to chronic illness, cancer and cancer related treatments as evidenced by energy intake < 75% for > or equal to 1 month, mild fat depletion, moderate fat depletion, mild muscle depletion.  GOAL:   Patient will meet greater than or equal to 90% of their needs  MONITOR:   PO intake, Supplement acceptance, Weight trends, Labs  REASON FOR ASSESSMENT:   Malnutrition Screening Tool   ASSESSMENT:   Patient with PMH significant for pancreatic cancer with mets to the liver s/p Whipple 12/2017 (currently undergoing chemotherapy), HTN, and HLD. Presents this admission with syncope with orthostatic hypotension.    7/15- EGD reveals no overt bleeding though stool was heme positive  Pt reports having loss in appetite since May or June due once she started chemotherapy. States "nothing taste the same" and she usually eats twice/day. Food options consist of grapes, potato soup, tomatoes, grilled fish, and oatmeal. She consumes two Ensure/day. Denies any swallowing issues. She attempted to eat a boiled egg and oatmeal this morning but vomited almost immediatly. She is followed by [facility] and tries to adhere to the recommendations. Discussed possibly increasing her at home Ensure to TID to prevent any further weight loss. Will provide pt will Ensure this admission.   Pt endorses a UBW of 205 lb, the last time being at that weight 12/2017. Records indicate pt weighed 201 lb 01/09/18 and 174 lb on 04/14/18. Will need to obtain a new actual weight to determine wt loss. Nutrition-Focused physical exam completed.   Medications reviewed and include: Remeron, D5 @ 100 ml/hr Labs reviewed: K 3.4 (L)   NUTRITION - FOCUSED PHYSICAL  EXAM:    Most Recent Value  Orbital Region  Mild depletion  Upper Arm Region  No depletion  Thoracic and Lumbar Region  Unable to assess  Buccal Region  Moderate depletion  Temple Region  Moderate depletion  Clavicle Bone Region  Mild depletion  Clavicle and Acromion Bone Region  No depletion  Scapular Bone Region  Unable to assess  Dorsal Hand  Mild depletion  Patellar Region  Mild depletion  Anterior Thigh Region  Mild depletion  Posterior Calf Region  Mild depletion  Edema (RD Assessment)  Moderate  Hair  Reviewed  Eyes  Reviewed  Mouth  Reviewed  Skin  Reviewed  Nails  Reviewed     Diet Order:   Diet Order           Diet regular Room service appropriate? Yes; Fluid consistency: Thin  Diet effective now          EDUCATION NEEDS:   Education needs have been addressed  Skin:  Skin Assessment: Reviewed RN Assessment  Last BM:  04/22/18  Height:   Ht Readings from Last 1 Encounters:  04/24/18 5\' 7"  (1.702 m)    Weight:   Wt Readings from Last 1 Encounters:  04/24/18 147 lb (66.7 kg)    Ideal Body Weight:  67.3 kg  BMI:  Body mass index is 23.02 kg/m.  Estimated Nutritional Needs:   Kcal:  1900-2100 kcal  Protein:  95-105 grams  Fluid:  >1.9 L/day    Lauren Richard RD, LDN Clinical Nutrition Pager # - [phone]

## 2018-04-26 ENCOUNTER — Telehealth: Payer: Self-pay | Admitting: Hematology

## 2018-04-26 NOTE — Telephone Encounter (Signed)
Called pt re appt changes per 7/15 sch msg - spoke w/ pt re appts.

## 2018-04-26 NOTE — Progress Notes (Signed)
[place]  [phone] [phone]  Clinic Follow Up Note   Patient Care Team: Glendale Chard, MD as PCP - General (Internal Medicine)   Date of Service:  05/01/2018  CHIEF COMPLAINTS:  F/u metastatic pancreatic cancer  SUMMARY OF ONCOLOGY HISTORY Cancer Staging Adenocarcinoma of head of pancreas [facility]) Staging form: Exocrine Pancreas, AJCC 8th Edition - Clinical: Stage IB (cT2, cN0, cM0) - Unsigned - Pathologic stage from 01/05/2018: Stage III (pT3, pN2, cM0) - Signed by Truitt Merle, MD on 02/10/2018  Oncology History   Cancer Staging Adenocarcinoma of head of pancreas [facility]) Staging form: Exocrine Pancreas, AJCC 8th Edition - Clinical: Stage IB (cT2, cN0, cM0) - Unsigned - Pathologic stage from 01/05/2018: Stage III (pT3, pN2, cM0) - Signed by Truitt Merle, MD on 02/10/2018       Adenocarcinoma of head of pancreas ([place])   11/03/2017 Imaging    CT AP WO CONTRAST IMPRESSION: 1. Findings are concerning for common bile duct obstruction, potentially related to a primary pancreatic neoplasm, as detailed above. At this time this is associated with moderate to severe intra and extrahepatic biliary ductal dilatation. Further evaluation with MRI of the abdomen with and without IV gadolinium with MRCP should be considered in the near future to better evaluate these findings. Alternatively, if the patient is not likely to be able to adequately hold her breath during an MRI examination, pancreatic protocol CT scan would be recommended. 2. Biliary sludge in the gallbladder. No findings to suggest an acute cholecystitis at this time. 3. Small right adrenal adenoma incidentally noted. 4. Aortic atherosclerosis, in addition to 3 vessel coronary artery disease. Assessment for potential risk factor modification, dietary therapy or pharmacologic therapy may be warranted, if clinically indicated.  Aortic Atherosclerosis (ICD10-I70.0).       11/04/2017  Procedure    ERCP per Dr. Benson Norway - The major papilla appeared normal. - A segmental biliary stricture was found. The stricture was malignant appearing. - The entire biliary tree was severely dilated, with a mass causing an obstruction. - A biliary sphincterotomy was performed. - One covered metal stent was placed into the common bile duct.      11/04/2017 Imaging    FINDINGS: Imaging demonstrates cannulation of the common bile duct. The common bile duct is dilated. The inferior common bile duct is narrowed. Final image demonstrates placement of a metallic stent across the area of narrowing into the duodenum.  IMPRESSION: Common bile duct stent placement.      11/11/2017 Procedure    EUS per Dr. Benson Norway Endosonographic Finding Findings: A round mass was identified in the pancreatic head. The mass was hypoechoic. The mass measured 30 mm by 22 mm in maximal cross-sectional diameter. The outer margins were irregular. Fine needle aspiration for cytology was performed. Color Doppler imaging was utilized prior to needle puncture to confirm a lack of significant vascular structures within the needle path. Five passes were made with the 25 gauge needle using a transduodenal approach. A stylet was used. A cytotechnologist was present to evaluate the adequacy of the specimen. The cellularity of the specimen was adequate. Final cytology results are pending. The procedure was technically difficult as the metallic stent obscured views of the lesion. It was also very difficult to obtain adequate views for FNA, however, adequate samples were obtained.      11/11/2017 Pathology Results    Diagnosis FINE NEEDLE ASPIRATION, ENDOSCOPIC, PANCREAS HEAD(SPECIMEN 1 OF 1 COLLECTED 11/11/17): ADENOCARCINOMA. Preliminary Diagnosis Intraoperative Diagnosis: SPINDLE CELL FRAGMENTS  AND RARE ATYPICAL EPITHELIAL CELLS.(JDP)      11/24/2017 Initial Diagnosis    Adenocarcinoma of head of pancreas ([place])       12/01/2017 Imaging    CT Abdomen W Contrast 12/01/17 IMPRESSION: 1. 21 x 20 mm pancreatic head/uncinate process lesion without obvious involvement/invasion of the major surrounding vascular structures. 2. Common bile duct stent in place with associated pneumobilia but decompression of the biliary tree. 3. Borderline enlarged celiac axis and periportal lymph nodes. 4. No findings to suggest hepatic metastatic disease. 5. Advanced aortic and branch vessel calcifications but no aneurysm or dissection. 6. Patchy right basilar atelectasis.      01/05/2018 Surgery    LAPAROSCOPY DIAGNOSTIC ERAS PATHWAY and WHIPPLE PROCEDURE by Dr. Barry Dienes and Dr. Johney Maine 01/05/18      01/05/2018 Pathology Results    Diagnosis 01/05/18 1. Gallbladder, and lymph node - CHRONIC CHOLECYSTITIS. - ONE BENIGN PERICYSTIC LYMPH NODE (0/1). - THERE IS NO EVIDENCE OF MALIGNANCY. 2. Lymph node, biopsy, Common Hepatic Artery - THERE IS NO EVIDENCE OF CARCINOMA IN 1 OF 1 LYMPH NODE (0/1). 3. Whipple procedure/resection - INVASIVE ADENOCARCINOMA, MODERATELY DIFFERENTIATED, SPANNING 4.0 CM. - PERINEURAL INVASION IS IDENTIFIED. - LYMPHOVASCULAR INVASION IS IDENTIFIED. - ADENOCARCINOMA IS PRESENT AT THE UNCINATE (RED) MARGIN. - ADENOCARCINOMA EXTENDS INTO PERIPANCREATIC SOFT TISSUE. - METASTATIC CARCINOMA IN 5 OF 29 LYMPH NODES (5/29), WITH EXTRACAPSULAR EXTENSION. - SEE ONCOLOGY TABLE BELOW. 4. Lymph node, biopsy, Portal - THERE IS NO EVIDENCE OF CARCINOMA IN 1 OF 1 LYMPH NODE (0/1). 5. Lymph node, biopsy, Superior mesenteric artery - THERE IS NO EVIDENCE OF CARCINOMA IN 1 OF 1 LYMPH NODE (0/1). 6. Lymph node, biopsy, Ligament of Treitz - THERE IS NO EVIDENCE OF CARCINOMA IN 1 OF 1 LYMPH NODE (0/1)      01/05/2018 - 01/14/2018 Hospital Admission    Admit date: 01/05/18 Admission diagnosis: Fever from infection Additional comments: According to Discharge summary Pt was admitted to the ICU following and she had an epidural  for pain control and low dose fentanyl PCA. Her NGT was able to be pulled on POD 2.5.  She had her foley pulled initially while epidural was still in and this had to be reinserted. She developed a fever, but UA was positive.  Once this was treated, she defervesced.  Culture did not grow anything. Her catheter was pulled after epidural was removed and she did not have any issues.  She had an initial setback when she went from clears to fulls the first time, but n/v resolved.  Her PCA was d/c'd.  She was OOB and worked well with PT.  She had some initial electrolyte abnormalities that resolved.  Her drains were removed without issue.  She had a central line removed without incident.         01/05/2018 Pathology Results    Gallbladder, and lymph node with chronic cholecystitis. One benign pericystic lymph node (0/1). There is no evidence of malignancy. Lymph node, biopsy, Common Hepatic Artery with no evidence of carcinoma in 1 of 1 lymph nodes (0/1). Whipple procedure/resection with invasive adenocarcinoma, moderately differentiated, spanning 4.0 cm. Perineural invasion is identified. Lymphovascular invasion is identified. Adenocarcinoma is present at the uncinate (red) margin. Adenocarcinoma extends into peripancreatic soft tissue. metastatic carcinoma in 5 of 29 lymph nodes (5/29), with extracapsular extension. Lymph node, biopsy, Portal with no evidence of carcinoma in 1 of 1 lymph node (0/1). Lymph node, biopsy, Superior mesenteric artery with no evidence of carcinoma in 1 of 1 lymph  node (0/1). Lymph node, biopsy, Ligament of Treitz with no evidence of carcinoma in 1 of 1 lymph node (0/1).        01/05/2018 Cancer Staging    Staging form: Exocrine Pancreas, AJCC 8th Edition - Pathologic stage from 01/05/2018: Stage III (pT3, pN2, cM0) - Signed by Truitt Merle, MD on 02/10/2018      02/16/2018 - 03/02/2018 Chemotherapy    Gemcitabine 3 weeks on and 1 week off for 6 months starting on 02/16/18 and stopped after 3  weeks due to disease progression.       03/09/2018 Imaging    CT CAP W contrast 03/09/18  IMPRESSION: 1. Acute pulmonary embolus demonstrated within the distal right main pulmonary artery extending into the right upper, right middle and right lower lobes. 2. Filling defect within the central right internal jugular vein may represent nonocclusive thrombus. 3. Interval development of multiple enhancing lesions within the liver most compatible with hepatic metastatic disease. 4. Interval Whipple procedure. Small cystic lesion at the site of the pancreatic duodenal anastomosis, potentially postprocedural in etiology. Recommend attention on follow-up. 5. Wall thickening and surrounding fat stranding proximal duodenum, potentially postprocedural in etiology. Recommend attention on follow-up. 6. Critical Value/emergent results were called by telephone at the time of interpretation on 03/09/2018 at 2:49 pm to Dr. Truitt Merle , who verbally acknowledged these results.       03/16/2018 -  Chemotherapy    Gemcitabine and Abraxane 2 weeks on and 1 week off starting 03/16/18. Due to poor toleration with neutropenic fever, Abraxane was discontinued after 1 cycle.       HISTORY OF PRESENTING ILLNESS:  Lauren Richard 82 y.o. female is here because of newly diagnosed pancreas cancer. She presented to ED on 10/18/17 complaining of weakness and chest discomfort, she reported a 10-day history of anorexia with intermittent nausea and vomiting.  She was evaluated and found to be in A. Fib with RVR, EKG without ischemia.  CTA negative for PE.  Symptoms improved and she was discharged home on Xarelto and CCB with follow-up at A. fib clinic on 10/24/2017; found to have abnormal TFTs on workup.  She was found to not have A. fib, Xarelto was stopped.  She presented to her PCP for regularly scheduled follow-up on 11/03/2017; with complaints of decreased appetite, 5-10 lbs weight loss, fatigue, and intermittent dizziness.   Had syncopal event there and found to have painless jaundice, was directly sent to ED for further workup, LFTs markedly elevated, T bili in the ED was elevated to 19.7.   During hospitalization she had AKI; had noncontrast CT on 11/03/2017 with findings of a mass like area measuring 2.1 x 2.3 x 3.1 cm in the head of the pancreas immediately below the level of the common bile duct truncation as well as findings concerning for common bile duct obstruction with moderate to severe intra-and extrahepatic biliary ductal dilatation.  No lymphadenopathy noted in the abdomen or pelvis.  ERCP showed the entire biliary tree to be severely dilated and diffusely dilated with a mass causing the obstruction. Biliary sphincterotomy was performed and a metal stent was successfully placed into the common bile duct.  She improved clinically and LFTs improved, she was discharged home to follow-up with Dr. Benson Norway outpatient with EUS with FNA.  Biopsy confirmed adenocarcinoma of the pancreatic head.  Past medical history is significant for hyperlipidemia, hypertension, arthritis.  Mild anemia found in July 2018, Hgb 10.2 at that time, 9.6 during recent hospitalization.  She  has history of oral iron supplementation and received a blood transfusion with hysterectomy in the past. Endocrinology consult is pending for hyperthyroidism due to altered TFTs; cardizem was stopped and she was started on beta-blocker for tachycardia during hospitalization.  She has positive family history for renal cancer in her mother and lung cancer in her father, he was a smoker.  She has occasional wine socially, never smoked.  She is widowed, did not have children; lives alone, independent of all ADLs, she does not drive but has family close by who drive and support her.  Prior to her illness she exercised at [facility] twice weekly; remains active in her church community.   Today she feels well, her appetite is returning.  She feels more like her usual self.   Denies pain, nausea.  BMs are regular.  She has mild back pain at her baseline, controlled with Tylenol as needed.  CURRENT THERAPY: pending Xeloda one week on , and one week off, starting at low dose    INTERVAL HISTORY:   Lauren Richard female presents to the office today for follow up and treatment. She presents to the clinic today accompanied by her son. She was admitted to the hospital for syncope on 04/22/2018 and was evaluated with a head CT, EEG and upper endoscopy. All were normal. She was discharged on 04/25/2018. She feels better today. She didn't receive a blood transfusion during her hospital stay. She overall feels better, her appetite  And energy level are improving, she is able to function independently at home. She walks with a walker.    MEDICAL HISTORY:  Past Medical History:  Diagnosis Date  . Adenocarcinoma of head of pancreas ([place]) 11/24/2017  . Arthritis    "all over" (04/26/2017); knees, shoulder  . Essential hypertension 03/03/2017  . Family history of breast cancer   . Family history of kidney cancer   . Family history of pancreatic cancer   . Family history of prostate cancer   . History of blood transfusion 1979   "when I had my hysterectomy"  . Hyperlipidemia   . Hypothyroidism   . Obesity 03/03/2017  . Osteoarthritis 03/03/2017  . Pneumonia    as a child    SURGICAL HISTORY: Past Surgical History:  Procedure Laterality Date  . BREAST CYST EXCISION Right    "cut 6 cysts out"  . CYST EXCISION Left    "off my toe"  . DILATION AND CURETTAGE OF UTERUS     "before hysterectomy"  . ERCP N/A 11/04/2017   Procedure: ENDOSCOPIC RETROGRADE CHOLANGIOPANCREATOGRAPHY (ERCP);  Surgeon: Carol Ada, MD;  Location: [place];  Service: Endoscopy;  Laterality: N/A;  . ESOPHAGOGASTRODUODENOSCOPY N/A 04/24/2018   Procedure: ESOPHAGOGASTRODUODENOSCOPY (EGD);  Surgeon: Jerene Bears, MD;  Location: [facility];  Service: Gastroenterology;  Laterality: N/A;  . EUS  N/A 11/11/2017   Procedure: UPPER ENDOSCOPIC ULTRASOUND (EUS) LINEAR;  Surgeon: Carol Ada, MD;  Location: [facility];  Service: Endoscopy;  Laterality: N/A;  . FINE NEEDLE ASPIRATION N/A 11/11/2017   Procedure: FINE NEEDLE ASPIRATION (FNA) LINEAR;  Surgeon: Carol Ada, MD;  Location: [facility];  Service: Endoscopy;  Laterality: N/A;  . INGUINAL HERNIA REPAIR Right   . LAPAROSCOPY N/A 01/05/2018   Procedure: LAPAROSCOPY DIAGNOSTIC ERAS PATHWAY;  Surgeon: Stark Klein, MD;  Location: [place];  Service: General;  Laterality: N/A;  [place]  . PORTACATH PLACEMENT Left 04/11/2018   Procedure: INSERTION PORT-A-CATH;  Surgeon: Stark Klein, MD;  Location: [place];  Service: General;  Laterality: Left;  . REDUCTION MAMMAPLASTY Bilateral 1980  . TONSILLECTOMY     "when I small"  . TOTAL ABDOMINAL HYSTERECTOMY  1979  . TOTAL KNEE ARTHROPLASTY Left 04/26/2017   Procedure: LEFT TOTAL KNEE ARTHROPLASTY;  Surgeon: [name], MD;  Location: [place];  Service: Orthopedics;  Laterality: Left;  . WHIPPLE PROCEDURE N/A 01/05/2018   Procedure: WHIPPLE PROCEDURE;  Surgeon: Stark Klein, MD;  Location: [place];  Service: General;  Laterality: N/A;    SOCIAL HISTORY: Social History   Socioeconomic History  . Marital status: Widowed    Spouse name: Not on file  . Number of children: Not on file  . Years of education: Not on file  . Highest education level: Not on file  Occupational History  . Not on file  Social Needs  . Financial resource strain: Not on file  . Food insecurity:    Worry: Not on file    Inability: Not on file  . Transportation needs:    Medical: Not on file    Non-medical: Not on file  Tobacco Use  . Smoking status: Never Smoker  . Smokeless tobacco: Never Used  Substance and Sexual Activity  . Alcohol use: No    Alcohol/week: 0.0 oz    Frequency: Never    Comment: occasional glass of wine   . Drug use: No  . Sexual activity: Never  Lifestyle  . Physical activity:     Days per week: Not on file    Minutes per session: Not on file  . Stress: Not on file  Relationships  . Social connections:    Talks on phone: Not on file    Gets together: Not on file    Attends religious service: Not on file    Active member of club or organization: Not on file    Attends meetings of clubs or organizations: Not on file    Relationship status: Not on file  . Intimate partner violence:    Fear of current or ex partner: Not on file    Emotionally abused: Not on file    Physically abused: Not on file    Forced sexual activity: Not on file  Other Topics Concern  . Not on file  Social History Narrative  . Not on file    FAMILY HISTORY: Family History  Problem Relation Age of Onset  . Kidney cancer Mother 47  . Lung cancer Father 50       lung, smoker  . Congestive Heart Failure Sister   . Asthma Sister   . Atrial fibrillation Sister   . Hypertension Sister   . Diabetes Sister   . Head & neck cancer Paternal Grandmother   . Cancer Maternal Uncle        type unk  . Pancreatic cancer Paternal Aunt        dx >50  . Cancer Paternal Uncle        type unk  . Breast cancer Cousin 61  . Prostate cancer Other 44       had robotic surgery    ALLERGIES:  has No Known Allergies.  MEDICATIONS:  Current Outpatient Medications  Medication Sig Dispense Refill  . Cholecalciferol (VITAMIN D) 2000 units tablet Take 2,000 Units by mouth daily.    . feeding supplement, ENSURE ENLIVE, (ENSURE ENLIVE) LIQD Take 237 mLs by mouth 3 (three) times daily between meals. 90 Bottle 0  . gabapentin (NEURONTIN) 100 MG capsule Take 2 capsules (200 mg total)  by mouth 2 (two) times daily. 30 capsule 0  . lidocaine-prilocaine (EMLA) cream Apply 1 application topically as needed. 30 g 1  . mirtazapine (REMERON) 7.5 MG tablet Take 1 tablet (7.5 mg total) by mouth at bedtime. 30 tablet 1  . nystatin (MYCOSTATIN) 100000 UNIT/ML suspension Take 5 mLs (500,000 Units total) by mouth 4 (four)  times daily. 100 mL 0  . ondansetron (ZOFRAN) 8 MG tablet Take 8 mg by mouth 2 (two) times daily as needed for nausea/vomiting.  1  . pantoprazole (PROTONIX) 40 MG tablet Take 1 tablet (40 mg total) by mouth daily at 12 noon. 30 tablet 0  . potassium chloride SA (K-DUR,KLOR-CON) 20 MEQ tablet Take 2 tablets (40 mEq total) by mouth daily. 60 tablet 2  . PRESCRIPTION MEDICATION Apply 1 application topically 4 (four) times daily as needed (for back pain). Dic 3%, Bac 2%, lid - pril 2-5%, Menth 1% cream     . prochlorperazine (COMPAZINE) 10 MG tablet Take 10 mg by mouth every 6 (six) hours as needed for nausea or vomiting.    . rivaroxaban (XARELTO) 20 MG TABS tablet Take 1 tablet (20 mg total) by mouth daily with supper. 30 tablet 0  . traMADol (ULTRAM) 50 MG tablet Take 50 mg by mouth every 12 (twelve) hours as needed (for pain.).    Marland Kitchen trolamine salicylate (ASPERCREME) 10 % cream Apply 1 application topically 4 (four) times daily as needed (for knee pain/muscle pain.).     Marland Kitchen capecitabine (XELODA) 500 MG tablet Take 2 tablets (1000mg ) by mouth 2 times daily, within 30 min of finishing food. Take on days 1-7 and 15-21 of each 28 day cycle 56 tablet 0   No current facility-administered medications for this visit.    Facility-Administered Medications Ordered in Other Visits  Medication Dose Route Frequency Provider Last Rate Last Dose  . potassium chloride SA (K-DUR,KLOR-CON) CR tablet 40 mEq  40 mEq Oral Once Truitt Merle, MD        REVIEW OF SYSTEMS:  Constitutional: Denies fevers, chills or abnormal night sweats (+) better energy and appetite Eyes: Denies blurriness of vision, double vision or watery eyes Ears, nose, mouth, throat, and face: Denies mucositis or sore throat Respiratory: Denies cough, dyspnea or wheezes Cardiovascular: Denies palpitation, chest discomfort or lower extremity swelling Gastrointestinal:  Denies constipation, diarrhea, heartburn or change in bowel habits  Skin: Denies  abnormal skin rashes   Lymphatics: Denies new lymphadenopathy or easy bruising Neurological:Denies numbness, tingling or new weaknesses Behavioral/Psych: Mood is stable, no new changes  All other systems were reviewed with the patient and are negative.  PHYSICAL EXAMINATION:  ECOG PERFORMANCE STATUS: 2  Vitals:   05/01/18 0952  BP: (!) 97/54  Pulse: 85  Resp: 18  Temp: 98.4 F (36.9 C)  SpO2: 98%   Filed Weights   05/01/18 0952  Weight: 163 lb 3.2 oz (74 kg)    GENERAL:alert, no distress and comfortable (+) uses walker to ambulate  SKIN: skin color, texture, turgor are normal, no rashes or significant lesions, port site is clean. EYES: normal, conjunctiva are pink and non-injected OROPHARYNX:no exudate, no erythema and lips, buccal mucosa, and tongue normal  NECK: supple, thyroid normal size, non-tender, without nodularity LYMPH:  no palpable cervical, supraclavicular, axillary, or inguinal lymphadenopathy  LUNGS: clear to auscultation bilaterally with normal breathing effort HEART: regular rate & rhythm and no murmurs and no lower extremity edema ABDOMEN:abdomen soft, non-tender and normal bowel sounds.  No hepatomegaly. Midline incision  from umbilical that is healed well with minimal scar tissues.  Musculoskeletal:no cyanosis of digits and no clubbing, mild bilateral lower extremity edema PSYCH: alert & oriented x 3 with fluent speech NEURO: no focal motor/sensory deficits  LABORATORY DATA:  I have reviewed the data as listed CBC Latest Ref Rng & Units 05/01/2018 04/25/2018 04/24/2018  WBC 3.9 - 10.3 K/uL 6.0 4.4 4.8  Hemoglobin 11.6 - 15.9 g/dL 9.8(L) 7.6(L) 7.5(L)  Hematocrit 34.8 - 46.6 % 30.9(L) 23.4(L) 23.4(L)  Platelets 145 - 400 K/uL 427(H) 204 179   CMP Latest Ref Rng & Units 05/01/2018 04/24/2018 04/23/2018  Glucose 70 - 99 mg/dL 96 90 104(H)  BUN 8 - 23 mg/dL 6(L) <5(L) <5(L)  Creatinine 0.44 - 1.00 mg/dL 0.66 0.61 0.63  Sodium 135 - 145 mmol/L 138 137 147(H)    Potassium 3.5 - 5.1 mmol/L 3.6 3.4(L) 3.6  Chloride 98 - 111 mmol/L 109 111 121(H)  CO2 22 - 32 mmol/L 21(L) 21(L) 22  Calcium 8.9 - 10.3 mg/dL 8.7(L) 8.1(L) 8.1(L)  Total Protein 6.5 - 8.1 g/dL 6.8 - -  Total Bilirubin 0.3 - 1.2 mg/dL 0.7 - -  Alkaline Phos 38 - 126 U/L 193(H) - -  AST 15 - 41 U/L 66(H) - -  ALT 0 - 44 U/L 33 - -   Tumor Markers CA 19.9 Results for LAILANA, SHIRA (MRN [id number]) as of 04/26/2018 16:54  Ref. Range 11/24/2017 15:08 02/16/2018 11:26 03/16/2018 09:26 04/14/2018 11:43  CA 19-9 Latest Ref Range: 0 - 35 U/mL 11 198 (H) 126 (H) 60 (H)     PATHOLOGY Diagnosis 11/11/17 FINE NEEDLE ASPIRATION, ENDOSCOPIC, PANCREAS HEAD(SPECIMEN 1 OF 1 COLLECTED 11/11/17): ADENOCARCINOMA. Preliminary Diagnosis Intraoperative Diagnosis: SPINDLE CELL FRAGMENTS AND RARE ATYPICAL EPITHELIAL CELLS.(JDP)  Whipple Procedure, 01/05/2018 1. Gallbladder, and lymph node - CHRONIC CHOLECYSTITIS. - ONE BENIGN PERICYSTIC LYMPH NODE (0/1). - THERE IS NO EVIDENCE OF MALIGNANCY. 2. Lymph node, biopsy, Common Hepatic Artery - THERE IS NO EVIDENCE OF CARCINOMA IN 1 OF 1 LYMPH NODE (0/1). 3. Whipple procedure/resection - INVASIVE ADENOCARCINOMA, MODERATELY DIFFERENTIATED, SPANNING 4.0 CM. - PERINEURAL INVASION IS IDENTIFIED. - LYMPHOVASCULAR INVASION IS IDENTIFIED. - ADENOCARCINOMA IS PRESENT AT THE UNCINATE (RED) MARGIN. - ADENOCARCINOMA EXTENDS INTO PERIPANCREATIC SOFT TISSUE. - METASTATIC CARCINOMA IN 5 OF 29 LYMPH NODES (5/29), WITH EXTRACAPSULAR EXTENSION. - SEE ONCOLOGY TABLE BELOW. 4. Lymph node, biopsy, Portal - THERE IS NO EVIDENCE OF CARCINOMA IN 1 OF 1 LYMPH NODE (0/1). 5. Lymph node, biopsy, Superior mesenteric artery - THERE IS NO EVIDENCE OF CARCINOMA IN 1 OF 1 LYMPH NODE (0/1). 6. Lymph node, biopsy, Ligament of Treitz - THERE IS NO EVIDENCE OF CARCINOMA IN 1 OF 1 LYMPH NODE (0/1).  RADIOGRAPHIC STUDIES:   I have personally reviewed the radiological images as listed and  agreed with the findings in the report. Ct Head Wo Contrast  Result Date: 04/22/2018 CLINICAL DATA:  82 year old female with history of cancer presenting with near syncopal episode. EXAM: CT HEAD WITHOUT CONTRAST TECHNIQUE: Contiguous axial images were obtained from the base of the skull through the vertex without intravenous contrast. COMPARISON:  None. FINDINGS: Brain: No evidence of acute infarction, hemorrhage, hydrocephalus, extra-axial collection or mass lesion/mass effect. Vascular: No hyperdense vessel or unexpected calcification. Skull: Normal. Negative for fracture or focal lesion. Sinuses/Orbits: No acute finding. Other: None. IMPRESSION: 1. No acute intracranial abnormalities. The appearance of the brain is normal. Electronically Signed   By: Vinnie Langton M.D.   On: 04/22/2018 15:52  Dg Chest Port 1 View  Result Date: 04/11/2018 CLINICAL DATA:  Port-A-Cath insertion EXAM: PORTABLE CHEST 1 VIEW COMPARISON:  04/02/2018 FINDINGS: Power port placed from a left subclavian approach has its tip in the SVC at the azygos level. No pneumothorax. Lungs remain clear except for mild basilar scarring. Chronic aortic atherosclerosis as seen previously. IMPRESSION: Power port placed on the left. Tip in the SVC at the azygos level. No complication. Electronically Signed   By: Nelson Chimes M.D.   On: 04/11/2018 09:13   Dg Chest Port 1 View  Result Date: 04/02/2018 CLINICAL DATA:  Neutropenic fever EXAM: PORTABLE CHEST 1 VIEW COMPARISON:  01/09/2018 CXR, chest CT 03/09/2018 FINDINGS: The heart size and mediastinal contours are within normal limits. Minimal aortic atherosclerosis with slight uncoiling of the thoracic aorta. No pulmonary consolidation. Minimal bibasilar atelectasis. No significant effusion or pneumothorax. The visualized skeletal structures are unremarkable. IMPRESSION: Bibasilar atelectasis. Aortic atherosclerosis. No acute pulmonary consolidation or CHF. Electronically Signed   By: Ashley Royalty M.D.   On: 04/02/2018 19:21   Dg Fluoro Guide Cv Line-no Report  Result Date: 04/11/2018 Fluoroscopy was utilized by the requesting physician.  No radiographic interpretation.    ASSESSMENT & PLAN:  Shanita Kanan is a pleasant 82 y.o. AAF who presented with syncopal event and obstructive juandice found to have malignant pancreatic head mass.   1. 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 She has what appears to be localized adenocarcinoma on pancreatic head.  -She underwent Whipple surgery on 01/05/18. She has recovered well from surgery, except not gaining weight back. -I previously discussed the recent pathology results with the patient and her family member.  She has locally advanced disease with 5+ lymph nodes, and positive margin at the uncinate -It is extremely high risk of recurrence, I recommended patient to start adjuvant gemcitabine. -Patient started single agent adjuvant chemotherapy gemcitabine 3 weeks on and 1 week off 02/16/18-03/02/18, tolerated well, without significant side effects. -Due to the elevated CA 19.9 tumor marker on 02/16/2018 with results of 198. -We previously discussed her CT CAP from 03/09/18 which showed PE in right pulmonary artery spanning to smaller blood vessels. Scan also showed growth in her liver lesions compatible with metastatic disease.  -Due to her advanced age, newly diagnosed PE which requires anticoagulation, and typical image findings and adequate to see 19.9, I think her metastatic disease is pretty certain, we will not pursue biopsy to confirm. -With metastatic disease, her cancer is no longer curable and surgery and targeted radiation are not option for now.  -She started Abraxane along with Gemcitabine for 2 weeks on 1 week off on 03/16/18 and pt did not  tolerated well, she ended up in the hospital for neutropenic fever, severe fatigue, diarrhea, after the first cycle. Abraxane was discontinued.  --She started cycle 2 gemcitabine alone on 04/14/18, but developed significant anemia, near syncope episode, and required hospitalization again. -I did not think she is a candidate for any more intravenous chemo.  We discussed the option of supportive care alone, versus single agent Xeloda.  Again patient strongly prefers to try anticancer treatment.  She has recovered well from her recent hospitalization, I will start her on Xeloda 1000mg  bid, one week on, and one week off.  If she tolerates well, will gradually increase her dose.   -We  again discussed the palliative and supportive care.  She is willing to continue physical therapy at home, I will restart her home care. -F/u in 3 week   2. PE in right pulmonary artery extended to smaller blood vessels  -seen on 03/09/18 CT scan, incidental finding, patient is not symptomatic. No signs of right heart strain on CT. -She has been on Xarelto, tolerating well. She does not need oxygen. -Continue Xarelto 20 mg daily.  This was held when she was recently admitted for severe anemia, I will restart her tomorrow.  Her endoscopy work-up was negative for bleeding.    3. Weight loss, decreased appetite, fatigue  -She previously had a 13 lbs weight loss in 2 months, since symptom onset and then an additional 16 lbs weight loss since her surgery -Patient to follow up with nutritionist as needed, last f/u on 02/15/2018. -She continued to have no appetite and moderate weight loss. I recommend she eat several small meals/snacks a day along with boost and increase water intake. Will monitor.  -I recommend she start her Mirtazapine prescription once daily for now and if tolerable she can increase to BID.  -Improving now.  4. HTN, hyperlipidemia -Managed with PCP -We will monitor her blood pressure closely during her  chemotherapy, and adjust her medication if needed  5. Transaminitis  -possible related to chemotherapy -Continue observation and follow-up closely  6. Social Support -Patient lives alone, her niece and nephew will bring her to the office visit and treatment, and support her at home. -Her sisters will rotate staying and taking care of her.   7. Goal of care discussion  -We again discussed the incurable nature of his/her cancer, and the overall poor prognosis, especially if he/she does not have good response to chemotherapy or progress on chemo -The patient understands the goal of care is palliative. -she is full code now   PLAN: -will stop her IV chemo -She decided to try Xeloda, will start her on 1000mg  bid, 7 days on, 7 days of -restart home care with nursing, and PT  -F/u in 3 weeks  No orders of the defined types were placed in this encounter.   All questions were answered. The patient knows to call the clinic with any problems, questions or concerns. I spent 25 minutes counseling the patient face to face. The total time spent in the appointment was 30 minutes and more than 50% was on counseling and review of test results.  Dierdre Searles Dweik am acting as scribe for Dr. Truitt Merle.  I have reviewed the above documentation for accuracy and completeness, and I agree with the above.   Truitt Merle, MD 05/01/2018

## 2018-04-28 ENCOUNTER — Inpatient Hospital Stay: Payer: PPO

## 2018-04-28 ENCOUNTER — Inpatient Hospital Stay: Payer: PPO | Admitting: Hematology

## 2018-05-01 ENCOUNTER — Telehealth: Payer: Self-pay

## 2018-05-01 ENCOUNTER — Inpatient Hospital Stay: Payer: PPO

## 2018-05-01 ENCOUNTER — Encounter: Payer: Self-pay | Admitting: Hematology

## 2018-05-01 ENCOUNTER — Telehealth: Payer: Self-pay | Admitting: Hematology

## 2018-05-01 ENCOUNTER — Inpatient Hospital Stay (HOSPITAL_BASED_OUTPATIENT_CLINIC_OR_DEPARTMENT_OTHER): Payer: PPO | Admitting: Hematology

## 2018-05-01 ENCOUNTER — Telehealth: Payer: Self-pay | Admitting: Genetics

## 2018-05-01 ENCOUNTER — Telehealth: Payer: Self-pay | Admitting: Pharmacist

## 2018-05-01 ENCOUNTER — Other Ambulatory Visit: Payer: Self-pay

## 2018-05-01 VITALS — BP 97/54 | HR 85 | Temp 98.4°F | Resp 18 | Ht 67.0 in | Wt 163.2 lb

## 2018-05-01 DIAGNOSIS — C250 Malignant neoplasm of head of pancreas: Secondary | ICD-10-CM | POA: Diagnosis not present

## 2018-05-01 DIAGNOSIS — Z5111 Encounter for antineoplastic chemotherapy: Secondary | ICD-10-CM | POA: Diagnosis not present

## 2018-05-01 DIAGNOSIS — I2510 Atherosclerotic heart disease of native coronary artery without angina pectoris: Secondary | ICD-10-CM | POA: Diagnosis not present

## 2018-05-01 DIAGNOSIS — R634 Abnormal weight loss: Secondary | ICD-10-CM

## 2018-05-01 DIAGNOSIS — I2699 Other pulmonary embolism without acute cor pulmonale: Secondary | ICD-10-CM | POA: Diagnosis not present

## 2018-05-01 DIAGNOSIS — I10 Essential (primary) hypertension: Secondary | ICD-10-CM | POA: Diagnosis not present

## 2018-05-01 DIAGNOSIS — R630 Anorexia: Secondary | ICD-10-CM

## 2018-05-01 DIAGNOSIS — Z7901 Long term (current) use of anticoagulants: Secondary | ICD-10-CM | POA: Diagnosis not present

## 2018-05-01 DIAGNOSIS — Z95828 Presence of other vascular implants and grafts: Secondary | ICD-10-CM

## 2018-05-01 DIAGNOSIS — D709 Neutropenia, unspecified: Secondary | ICD-10-CM | POA: Diagnosis not present

## 2018-05-01 DIAGNOSIS — C787 Secondary malignant neoplasm of liver and intrahepatic bile duct: Secondary | ICD-10-CM

## 2018-05-01 DIAGNOSIS — I700 Atherosclerosis of aorta: Secondary | ICD-10-CM | POA: Diagnosis not present

## 2018-05-01 LAB — CMP (CANCER CENTER ONLY)
ALBUMIN: 2.3 g/dL — AB (ref 3.5–5.0)
ALT: 33 U/L (ref 0–44)
AST: 66 U/L — AB (ref 15–41)
Alkaline Phosphatase: 193 U/L — ABNORMAL HIGH (ref 38–126)
Anion gap: 8 (ref 5–15)
BUN: 6 mg/dL — AB (ref 8–23)
CHLORIDE: 109 mmol/L (ref 98–111)
CO2: 21 mmol/L — AB (ref 22–32)
CREATININE: 0.66 mg/dL (ref 0.44–1.00)
Calcium: 8.7 mg/dL — ABNORMAL LOW (ref 8.9–10.3)
GFR, Est AFR Am: >60 mL/min (ref >60)
GLUCOSE: 96 mg/dL (ref 70–99)
POTASSIUM: 3.6 mmol/L (ref 3.5–5.1)
Sodium: 138 mmol/L (ref 135–145)
Total Bilirubin: 0.7 mg/dL (ref 0.3–1.2)
Total Protein: 6.8 g/dL (ref 6.5–8.1)

## 2018-05-01 LAB — CBC WITH DIFFERENTIAL (CANCER CENTER ONLY)
Basophils Absolute: 0 10*3/uL (ref 0.0–0.1)
Basophils Relative: 0 %
EOS ABS: 0 10*3/uL (ref 0.0–0.5)
EOS PCT: 1 %
HCT: 30.9 % — ABNORMAL LOW (ref 34.8–46.6)
Hemoglobin: 9.8 g/dL — ABNORMAL LOW (ref 11.6–15.9)
LYMPHS ABS: 1.3 10*3/uL (ref 0.9–3.3)
LYMPHS PCT: 22 %
MCH: 32 pg (ref 25.1–34.0)
MCHC: 31.7 g/dL (ref 31.5–36.0)
MCV: 101 fL (ref 79.5–101.0)
MONOS PCT: 13 %
Monocytes Absolute: 0.8 10*3/uL (ref 0.1–0.9)
Neutro Abs: 3.9 10*3/uL (ref 1.5–6.5)
Neutrophils Relative %: 64 %
PLATELETS: 427 10*3/uL — AB (ref 145–400)
RBC: 3.06 MIL/uL — AB (ref 3.70–5.45)
RDW: 20.5 % — ABNORMAL HIGH (ref 11.2–14.5)
WBC: 6 10*3/uL (ref 3.9–10.3)

## 2018-05-01 LAB — SAMPLE TO BLOOD BANK

## 2018-05-01 MED ORDER — HEPARIN SOD (PORK) LOCK FLUSH 100 UNIT/ML IV SOLN
500.0000 [IU] | Freq: Once | INTRAVENOUS | Status: AC
  Administered 2018-05-01: 500 [IU] via INTRAVENOUS
  Filled 2018-05-01: qty 5

## 2018-05-01 MED ORDER — CAPECITABINE 500 MG PO TABS: ORAL_TABLET | ORAL | 0 refills | Status: DC

## 2018-05-01 MED ORDER — SODIUM CHLORIDE 0.9% FLUSH
10.0000 mL | INTRAVENOUS | Status: DC | PRN
  Administered 2018-05-01: 10 mL via INTRAVENOUS
  Filled 2018-05-01: qty 10

## 2018-05-01 MED ORDER — MIRTAZAPINE 7.5 MG PO TABS: 7.5000 mg | ORAL_TABLET | Freq: Every day | ORAL | 1 refills | Status: DC

## 2018-05-01 NOTE — Progress Notes (Signed)
Pt. Port accessed no blood return noted, labs taken peripheral, Pt. Stated she did not want to wait for cath flo to be placed. Lauren Richard

## 2018-05-01 NOTE — Telephone Encounter (Signed)
Scheduled appt per 7/22 los - gave patient AVS and calender per los.  

## 2018-05-01 NOTE — Telephone Encounter (Addendum)
Oral Oncology Patient Advocate Encounter  Patient had a copay of $231.12 for her Xeloda. I was able to secure her a grant through [facility] that brought her copay to $0.  BIN: [id number] ID: [id number] Group: [id number]  PCN: [id number]  Lauren Richard is $[id number]   [name] Phone [phone] Fax [phone]

## 2018-05-01 NOTE — Telephone Encounter (Signed)
Oral Chemotherapy Pharmacist Encounter   I spoke with patient for overview of: Xeloda (capecitabine).   Counseled patient on administration, dosing, side effects, monitoring, drug-food interactions, safe handling, storage, and disposal.  Patient will take Xeloda 500mg  tablets, 2 tablets (1000mg ) by mouth in AM and 2 tabs (1000mg ) by mouth in PM, within 30 minutes of finishing meals, on days 1-7 and 15-21 of each 28 day cycle.   We discussed that she will take her Xeloda twice daily after food Monday through Sunday for 1 week on, 1 week off, and then repeated.  Xeloda start date: 05/08/2018  Adverse effects include but are not limited to: fatigue, decreased blood counts, GI upset, diarrhea, and hand-foot syndrome.  Patient has anti-emetic on hand and knows to take it if nausea develops.   Patient will obtain anti diarrheal and alert the office of 4 or more loose stools above baseline.  Reviewed with patient importance of keeping a medication schedule and plan for any missed doses.  Ms. Golembeski voiced understanding and appreciation.   All questions answered. Medication reconciliation performed and medication/allergy list updated.  Patient states she received call from Dr. Ernestina Penna collaborative practice RN with directions to restart Xarelto. Patient states she has a home supply of Xarelto 15 mg tablets and questions whether she can use her home supply. Patient instructed she can take her Xarelto 15 mg tablets once daily with supper. She will restart Xarelto this evening.  Test claim at the pharmacy revealed copayment $230 for each 4-week supply of Xeloda. Oral oncology patient advocate will work with patient to obtain copayment assistance in an effort to reduce out-of-pocket cost for Xeloda to $0  Xeloda will ship from the [facility] on Thursday for delivery to patient's home on Friday (05/05/2018). Patient will start her Xeloda on Monday morning. Patient understands  the pharmacy will call 5 to 7 days prior to needing next shipment of Xeloda to coordinate with the office.  Patient knows to call the office with questions or concerns. Oral Oncology Clinic will continue to follow.  Thank you,  Johny Drilling, PharmD, BCPS, BCOP  05/01/2018   3:08 PM Oral Oncology Clinic [phone]

## 2018-05-01 NOTE — Telephone Encounter (Signed)
Per Dr. Burr Medico instructed patient to restart her Xarelto, patient verbalized an understanding.   Also told her Evelena Peat with pharmacy will be calling her about oral chemo medication.

## 2018-05-01 NOTE — Telephone Encounter (Addendum)
Oral Oncology Pharmacist Encounter  Received new prescription for Xeloda (capecitabine) for the maintenance treatment of metastatic pancreatic adenocarcinoma, planned duration until disease progression to unacceptable toxicity. Incidental diagnosis with stage III disease in Feb 2019, pt is s/p resection on 01/05/18 Patient started on adjuvant monotherapy with gemcitabine in May 2019, Abraxane was later added due to evidence of disease progression. Abraxane has now been discontinued secondary to neutropenic fever. Gemcitabine will now be discontinued due to toleration issues. Plan for monotherapy Xeloda.  Labs from 05/01/18 assessed, OK for treatment. LFTs returning to normal. Xeloda dosed at ~530 mg/m2 BID for increased tolerance, given 1 week on, 1 week off, repeated  Current medication list in Epic reviewed, no significant DDIs with Xeloda identified. Continued need for proton pump inhibitor therapy will be addressed with patient at initial counseling.  Prescription has been e-scribed to the [facility] for benefits analysis and approval.  Oral Oncology Clinic will continue to follow for insurance authorization, copayment issues, initial counseling and start date.   Johny Drilling, PharmD, BCPS, BCOP  05/01/2018 12:26 PM Oral Oncology Clinic [phone]

## 2018-05-01 NOTE — Telephone Encounter (Signed)
Revealed negative genetic testing. This normal result is reassuring and indicates that it is unlikely Lauren Richard cancer is due to a hereditary cause.  It is unlikely that there is an increased risk of another cancer due to a mutation in one of these genes.  However, genetic testing is not perfect, and cannot definitively rule out a hereditary cause.  It will be important for her to keep in contact with genetics to learn if any additional testing may be needed in the future.

## 2018-05-02 DIAGNOSIS — Z09 Encounter for follow-up examination after completed treatment for conditions other than malignant neoplasm: Secondary | ICD-10-CM | POA: Diagnosis not present

## 2018-05-02 DIAGNOSIS — Z712 Person consulting for explanation of examination or test findings: Secondary | ICD-10-CM | POA: Diagnosis not present

## 2018-05-02 DIAGNOSIS — C250 Malignant neoplasm of head of pancreas: Secondary | ICD-10-CM | POA: Diagnosis not present

## 2018-05-02 DIAGNOSIS — Z8679 Personal history of other diseases of the circulatory system: Secondary | ICD-10-CM | POA: Diagnosis not present

## 2018-05-03 NOTE — Telephone Encounter (Signed)
Oral Chemotherapy Pharmacist Encounter   I spoke with patient's nice, Nevin Bloodgood, for overview of: Xeloda (capecitabine).   Counseledon administration, dosing, side effects, monitoring, drug-food interactions, safe handling, storage, and disposal.  Patient will take Xeloda 500mg  tablets, 2 tablets (1000mg ) by mouth in AM and 2 tabs (1000mg ) by mouth in PM, within 30 minutes of finishing meals, on days 1-7 and 15-21 of each 28 day cycle.   We discussed that Ms. Dominica Severin will take her Xeloda twice daily after food Monday through Sunday for 1 week on, 1 week off, and then repeated.  Nevin Bloodgood stated her aunt's appetite is not robust, but they will ensure she has some food on her stomach prior to administration of Xeloda to prevent abdominal cramping.  Xeloda start date: 05/08/2018  Adverse effects include but are not limited to: fatigue, decreased blood counts, GI upset, diarrhea, and hand-foot syndrome.  Nevin Bloodgood states patient has anti-emetic on hand and will take it if nausea develops.   Nevin Bloodgood states patient has anti diarrheal, they will treat any diarrhea symptoms that may occur, and will alert the office of 4 or more loose stools above baseline.  Reviewed importance of keeping a medication schedule and plan for any missed doses. Reviewed low dose strategy being employed for increased toleration due to issues with IV chemotherapy and recent hospital admission.  Medication reconciliation performed and medication/allergy list updated.  I informed Nevin Bloodgood about copayment $230 for each 4-week supply of Xeloda and copayment assistance secured from [facility] in an effort to reduce out-of-pocket cost for Xeloda to $0  Nevin Bloodgood informed Xeloda will ship from the [facility] pharmacy on Thursday for delivery to patient's home on Friday (05/05/2018).  Nevin Bloodgood understands the pharmacy will call 5 to 7 days prior to needing next shipment of Xeloda to coordinate with the patient.  All  questions answered.  Above information is also being emailed to patient's niece at [email or web address]  Nevin Bloodgood voiced understanding and appreciation.   Nevin Bloodgood knows to call the office with questions or concerns. Oral Oncology Clinic will continue to follow.  Thank you,  Johny Drilling, PharmD, BCPS, BCOP  05/03/2018  11:16 AM  Oral Oncology Clinic [phone]

## 2018-05-04 MED FILL — CAPECITABINE 500 MG TABLET: 500 | 28 days supply | Qty: 56 | Fill #0

## 2018-05-04 NOTE — Telephone Encounter (Signed)
Oral Oncology Patient Advocate Encounter  Xeloda has been mailed 7/25 for delivery 7/26.  [place] Patient [place] Phone [phone] Fax [phone]

## 2018-05-05 ENCOUNTER — Other Ambulatory Visit: Payer: PPO

## 2018-05-05 ENCOUNTER — Ambulatory Visit: Payer: PPO | Admitting: Nurse Practitioner

## 2018-05-05 ENCOUNTER — Ambulatory Visit: Payer: PPO

## 2018-05-05 ENCOUNTER — Ambulatory Visit: Payer: Self-pay | Admitting: Genetics

## 2018-05-05 ENCOUNTER — Encounter: Payer: Self-pay | Admitting: Genetics

## 2018-05-05 DIAGNOSIS — C250 Malignant neoplasm of head of pancreas: Secondary | ICD-10-CM

## 2018-05-05 DIAGNOSIS — Z1379 Encounter for other screening for genetic and chromosomal anomalies: Secondary | ICD-10-CM | POA: Insufficient documentation

## 2018-05-05 DIAGNOSIS — Z803 Family history of malignant neoplasm of breast: Secondary | ICD-10-CM

## 2018-05-05 DIAGNOSIS — Z8042 Family history of malignant neoplasm of prostate: Secondary | ICD-10-CM

## 2018-05-05 DIAGNOSIS — Z800 Family history of malignant neoplasm of digestive organs: Secondary | ICD-10-CM

## 2018-05-05 NOTE — Progress Notes (Signed)
HPI:  Lauren Richard was previously seen in the [facility] [facility] on 03/23/2018 due to a personal and family history of cancer and concerns regarding a hereditary predisposition to cancer. Please refer to our prior cancer genetics clinic note for more information regarding Lauren Richard medical, social and family histories, and our assessment and recommendations, at the time. Lauren Richard recent genetic test results were disclosed to her, as well as recommendations warranted by these results. These results and recommendations are discussed in more detail below.  CANCER HISTORY:  Oncology History   Cancer Staging Adenocarcinoma of head of pancreas [facility]) Staging form: Exocrine Pancreas, AJCC 8th Edition - Clinical: Stage IB (cT2, cN0, cM0) - Unsigned - Pathologic stage from 01/05/2018: Stage III (pT3, pN2, cM0) - Signed by Truitt Merle, MD on 02/10/2018       Adenocarcinoma of head of pancreas ([place])   11/03/2017 Imaging    CT AP WO CONTRAST IMPRESSION: 1. Findings are concerning for common bile duct obstruction, potentially related to a primary pancreatic neoplasm, as detailed above. At this time this is associated with moderate to severe intra and extrahepatic biliary ductal dilatation. Further evaluation with MRI of the abdomen with and without IV gadolinium with MRCP should be considered in the near future to better evaluate these findings. Alternatively, if the patient is not likely to be able to adequately hold her breath during an MRI examination, pancreatic protocol CT scan would be recommended. 2. Biliary sludge in the gallbladder. No findings to suggest an acute cholecystitis at this time. 3. Small right adrenal adenoma incidentally noted. 4. Aortic atherosclerosis, in addition to 3 vessel coronary artery disease. Assessment for potential risk factor modification, dietary therapy or pharmacologic therapy may be warranted, if clinically indicated.  Aortic Atherosclerosis  (ICD10-I70.0).       11/04/2017 Procedure    ERCP per Dr. Benson Norway - The major papilla appeared normal. - A segmental biliary stricture was found. The stricture was malignant appearing. - The entire biliary tree was severely dilated, with a mass causing an obstruction. - A biliary sphincterotomy was performed. - One covered metal stent was placed into the common bile duct.      11/04/2017 Imaging    FINDINGS: Imaging demonstrates cannulation of the common bile duct. The common bile duct is dilated. The inferior common bile duct is narrowed. Final image demonstrates placement of a metallic stent across the area of narrowing into the duodenum.  IMPRESSION: Common bile duct stent placement.      11/11/2017 Procedure    EUS per Dr. Benson Norway Endosonographic Finding Findings: A round mass was identified in the pancreatic head. The mass was hypoechoic. The mass measured 30 mm by 22 mm in maximal cross-sectional diameter. The outer margins were irregular. Fine needle aspiration for cytology was performed. Color Doppler imaging was utilized prior to needle puncture to confirm a lack of significant vascular structures within the needle path. Five passes were made with the 25 gauge needle using a transduodenal approach. A stylet was used. A cytotechnologist was present to evaluate the adequacy of the specimen. The cellularity of the specimen was adequate. Final cytology results are pending. The procedure was technically difficult as the metallic stent obscured views of the lesion. It was also very difficult to obtain adequate views for FNA, however, adequate samples were obtained.      11/11/2017 Pathology Results    Diagnosis FINE NEEDLE ASPIRATION, ENDOSCOPIC, PANCREAS HEAD(SPECIMEN 1 OF 1 COLLECTED 11/11/17): ADENOCARCINOMA. Preliminary Diagnosis Intraoperative Diagnosis: SPINDLE  CELL FRAGMENTS AND RARE ATYPICAL EPITHELIAL CELLS.(JDP)      11/24/2017 Initial Diagnosis    Adenocarcinoma of  head of pancreas ([place])      12/01/2017 Imaging    CT Abdomen W Contrast 12/01/17 IMPRESSION: 1. 21 x 20 mm pancreatic head/uncinate process lesion without obvious involvement/invasion of the major surrounding vascular structures. 2. Common bile duct stent in place with associated pneumobilia but decompression of the biliary tree. 3. Borderline enlarged celiac axis and periportal lymph nodes. 4. No findings to suggest hepatic metastatic disease. 5. Advanced aortic and branch vessel calcifications but no aneurysm or dissection. 6. Patchy right basilar atelectasis.      01/05/2018 Surgery    LAPAROSCOPY DIAGNOSTIC ERAS PATHWAY and WHIPPLE PROCEDURE by Dr. Barry Dienes and Dr. Johney Maine 01/05/18      01/05/2018 Pathology Results    Diagnosis 01/05/18 1. Gallbladder, and lymph node - CHRONIC CHOLECYSTITIS. - ONE BENIGN PERICYSTIC LYMPH NODE (0/1). - THERE IS NO EVIDENCE OF MALIGNANCY. 2. Lymph node, biopsy, Common Hepatic Artery - THERE IS NO EVIDENCE OF CARCINOMA IN 1 OF 1 LYMPH NODE (0/1). 3. Whipple procedure/resection - INVASIVE ADENOCARCINOMA, MODERATELY DIFFERENTIATED, SPANNING 4.0 CM. - PERINEURAL INVASION IS IDENTIFIED. - LYMPHOVASCULAR INVASION IS IDENTIFIED. - ADENOCARCINOMA IS PRESENT AT THE UNCINATE (RED) MARGIN. - ADENOCARCINOMA EXTENDS INTO PERIPANCREATIC SOFT TISSUE. - METASTATIC CARCINOMA IN 5 OF 29 LYMPH NODES (5/29), WITH EXTRACAPSULAR EXTENSION. - SEE ONCOLOGY TABLE BELOW. 4. Lymph node, biopsy, Portal - THERE IS NO EVIDENCE OF CARCINOMA IN 1 OF 1 LYMPH NODE (0/1). 5. Lymph node, biopsy, Superior mesenteric artery - THERE IS NO EVIDENCE OF CARCINOMA IN 1 OF 1 LYMPH NODE (0/1). 6. Lymph node, biopsy, Ligament of Treitz - THERE IS NO EVIDENCE OF CARCINOMA IN 1 OF 1 LYMPH NODE (0/1)      01/05/2018 - 01/14/2018 Hospital Admission    Admit date: 01/05/18 Admission diagnosis: Fever from infection Additional comments: According to Discharge summary Pt was admitted to the ICU  following and she had an epidural for pain control and low dose fentanyl PCA. Her NGT was able to be pulled on POD 2.5.  She had her foley pulled initially while epidural was still in and this had to be reinserted. She developed a fever, but UA was positive.  Once this was treated, she defervesced.  Culture did not grow anything. Her catheter was pulled after epidural was removed and she did not have any issues.  She had an initial setback when she went from clears to fulls the first time, but n/v resolved.  Her PCA was d/c'd.  She was OOB and worked well with PT.  She had some initial electrolyte abnormalities that resolved.  Her drains were removed without issue.  She had a central line removed without incident.         01/05/2018 Pathology Results    Gallbladder, and lymph node with chronic cholecystitis. One benign pericystic lymph node (0/1). There is no evidence of malignancy. Lymph node, biopsy, Common Hepatic Artery with no evidence of carcinoma in 1 of 1 lymph nodes (0/1). Whipple procedure/resection with invasive adenocarcinoma, moderately differentiated, spanning 4.0 cm. Perineural invasion is identified. Lymphovascular invasion is identified. Adenocarcinoma is present at the uncinate (red) margin. Adenocarcinoma extends into peripancreatic soft tissue. metastatic carcinoma in 5 of 29 lymph nodes (5/29), with extracapsular extension. Lymph node, biopsy, Portal with no evidence of carcinoma in 1 of 1 lymph node (0/1). Lymph node, biopsy, Superior mesenteric artery with no evidence of carcinoma in 1 of  1 lymph node (0/1). Lymph node, biopsy, Ligament of Treitz with no evidence of carcinoma in 1 of 1 lymph node (0/1).        01/05/2018 Cancer Staging    Staging form: Exocrine Pancreas, AJCC 8th Edition - Pathologic stage from 01/05/2018: Stage III (pT3, pN2, cM0) - Signed by Truitt Merle, MD on 02/10/2018      02/16/2018 - 03/02/2018 Chemotherapy    Gemcitabine 3 weeks on and 1 week off for 6 months  starting on 02/16/18 and stopped after 3 weeks due to disease progression.       03/09/2018 Imaging    CT CAP W contrast 03/09/18  IMPRESSION: 1. Acute pulmonary embolus demonstrated within the distal right main pulmonary artery extending into the right upper, right middle and right lower lobes. 2. Filling defect within the central right internal jugular vein may represent nonocclusive thrombus. 3. Interval development of multiple enhancing lesions within the liver most compatible with hepatic metastatic disease. 4. Interval Whipple procedure. Small cystic lesion at the site of the pancreatic duodenal anastomosis, potentially postprocedural in etiology. Recommend attention on follow-up. 5. Wall thickening and surrounding fat stranding proximal duodenum, potentially postprocedural in etiology. Recommend attention on follow-up. 6. Critical Value/emergent results were called by telephone at the time of interpretation on 03/09/2018 at 2:49 pm to Dr. Truitt Merle , who verbally acknowledged these results.       03/16/2018 -  Chemotherapy    Gemcitabine and Abraxane 2 weeks on and 1 week off starting 03/16/18. Due to poor toleration with neutropenic fever, Abraxane was discontinued after 1 cycle.         FAMILY HISTORY:  We obtained a detailed, 4-generation family history.  Significant diagnoses are listed below: Family History  Problem Relation Age of Onset  . Kidney cancer Mother 31  . Lung cancer Father 55       lung, smoker  . Congestive Heart Failure Sister   . Asthma Sister   . Atrial fibrillation Sister   . Hypertension Sister   . Diabetes Sister   . Head & neck cancer Paternal Grandmother   . Cancer Maternal Uncle        type unk  . Pancreatic cancer Paternal Aunt        dx >50  . Cancer Paternal Uncle        type unk  . Breast cancer Cousin 32  . Prostate cancer Other 62       had robotic surgery    Lauren Richard has no children.  Lauren Richard has 4 sisters.  1 is a fraternal  twin sister with no history of cancer.  Her other 3 sisters are alive and in their 74's and 72's with no history of cancer.  Lauren Richard has a nephew who was diagnosed with prostate cancer at 25 and had robotic surgery to remove his prostate.   Lauren Richard father: died at 83 due to lung cancer.  Paternal aunts/Uncles: 2 paternal uncles 4 paternal aunts.  1 paternal uncle had cancer, type unk. 1 paternal aunt died of pancreatic cancer> 69.  Paternal cousins: 1 paternal cousin was diagnosed with breast cancer in her late 23's.   Paternal grandfather: died in old age, unk cause Paternal grandmother:died in old age, unk cause  Lauren Richard mother: died at 74 and had renal cancer in her 41's.  Maternal Aunts/Uncles: 5 maternal aunts, 3 maternal uncles.  1 maternal uncle had cancer- type unk.  Maternal cousins: no reported history of  cancer.  Maternal grandfather: unk Maternal grandmother: Had head and neck cancer, had to have shoulder/arm removed.   Lauren Richard is unaware of previous family history of genetic testing for hereditary cancer risks. Patient's maternal ancestors are of African American descent, and paternal ancestors are of African American/Caucasian descent. There is no reported Ashkenazi Jewish ancestry. There is no known consanguinity.  GENETIC TEST RESULTS: Genetic testing performed through [facility] Common Hereditary Caner Panel + Renal/Urinary Tract Panel reported out on 04/05/2018 showed no pathogenic mutations. The following genes were evaluated for sequence changes and exonic deletions/duplications:APC, ATM, AXIN2, BAP1, BARD1, BMPR1A, BRCA1, BRCA2, BRIP1, CDC73, CDH1, CDK4, CDKN1C, CDKN2A (p14ARF), CDKN2A, (p16INK4a), CHEK2, CTNNA1, DICER1, DIS3L2, EPCAM*, FH, FLCN, GPC3, GREM1*, KIT, MEN1, MET, MLH1, MSH2, MSH3, MSH6,MUTYH, NBN, NF1, PALB2, PDGFRA, PMS2, POLD1, POLE, PTEN, RAD50, RAD51C, RAD51D, SDHB, SDHC, SDHD, SMAD4,SMARCA4, SMARCB1, STK11, TP53, TSC1, TSC2, VHL, WT1. The following  genes were evaluated for sequence changes only: HOXB13*, NTHL1*, SDHA.  The test report will be scanned into EPIC and will be located under the Molecular Pathology section of the Results Review tab. A portion of the result report is included below for reference.     We discussed with Lauren Richard that because current genetic testing is not perfect, it is possible there may be a gene mutation in one of these genes that current testing cannot detect, but that chance is small.  We also discussed, that there could be another gene that has not yet been discovered, or that we have not yet tested, that is responsible for the cancer diagnoses in the family. It is also possible there is a hereditary cause for the cancer in the family that Lauren Richard did not inherit and therefore was not identified in her testing.  Therefore, it is important to remain in touch with cancer genetics in the future so that we can continue to offer Lauren Richard the most up to date genetic testing.   ADDITIONAL GENETIC TESTING: We discussed with Lauren Richard that there are other genes that are associated with increased cancer risk that can be analyzed. The laboratories that offer this testing look at these additional genes via a hereditary cancer gene panel. Should Lauren Richard wish to pursue additional genetic testing, we are happy to discuss and coordinate this testing, at any time.    CANCER SCREENING RECOMMENDATIONS: This negative result means that we were unable to identify a hereditary cause for her personal and family history of cancer at this time.  While reassuring, this result does not definitively rule out a hereditary cause for cancer.  It is still possible that there could be genetic mutations that are undetectable by current technology, or genetic mutations in genes that have not been tested or identified to increase cancer risk.  Therefore, it is recommended she continue to follow the cancer management and screening guidelines provided by  her oncology and primary healthcare provider. An individual's cancer risk is not determined by genetic test results alone.  Overall cancer risk assessment includes additional factors such as personal medical history, family history, etc.  These should be used to make a personalized plan for cancer prevention and surveillance.    RECOMMENDATIONS FOR FAMILY MEMBERS:  Relatives in this family might be at some increased risk of developing cancer, over the general population risk, simply due to the family history of cancer.  We recommended women in this family have a yearly mammogram beginning at age 61, or 44 years younger than the earliest onset of  cancer, an annual clinical breast exam, and perform monthly breast self-exams. Women in this family should also have a gynecological exam as recommended by their primary provider. All family members should have a colonoscopy by age 80 (or as directed by their doctors).  All family members should inform their physicians about the family history of cancer so their doctors can make the most appropriate screening recommendations for them.   It is also possible there is a hereditary cause for the cancer in Lauren Richard family that she did not inherit and therefore was not identified in her.  Therefore, we recommended siblings and paternal relatives also have genetic counseling and testing. Ms. Lucente will let us know if we can be of any assistance in coordinating genetic counseling and/or testing for these family members.   FOLLOW-UP: Lastly, we discussed with Ms. Kerrigan that cancer genetics is a rapidly advancing field and it is possible that new genetic tests will be appropriate for her and/or her family members in the future. We encouraged her to remain in contact with cancer genetics on an annual basis so we can update her personal and family histories and let her know of advances in cancer genetics that may benefit this family.   Our contact number was provided. Ms. Mihalko  questions were answered to her satisfaction, and she knows she is welcome to call us at anytime with additional questions or concerns.   Ferol Luz, MS, [facility] Certified Genetic Counselor [email or web address]

## 2018-05-09 DIAGNOSIS — I700 Atherosclerosis of aorta: Secondary | ICD-10-CM | POA: Diagnosis not present

## 2018-05-09 DIAGNOSIS — E785 Hyperlipidemia, unspecified: Secondary | ICD-10-CM | POA: Diagnosis not present

## 2018-05-09 DIAGNOSIS — I10 Essential (primary) hypertension: Secondary | ICD-10-CM | POA: Diagnosis not present

## 2018-05-09 DIAGNOSIS — I2699 Other pulmonary embolism without acute cor pulmonale: Secondary | ICD-10-CM | POA: Diagnosis not present

## 2018-05-09 DIAGNOSIS — C250 Malignant neoplasm of head of pancreas: Secondary | ICD-10-CM | POA: Diagnosis not present

## 2018-05-11 DIAGNOSIS — I2699 Other pulmonary embolism without acute cor pulmonale: Secondary | ICD-10-CM | POA: Diagnosis not present

## 2018-05-11 DIAGNOSIS — C250 Malignant neoplasm of head of pancreas: Secondary | ICD-10-CM | POA: Diagnosis not present

## 2018-05-11 DIAGNOSIS — E785 Hyperlipidemia, unspecified: Secondary | ICD-10-CM | POA: Diagnosis not present

## 2018-05-11 DIAGNOSIS — I700 Atherosclerosis of aorta: Secondary | ICD-10-CM | POA: Diagnosis not present

## 2018-05-11 DIAGNOSIS — I10 Essential (primary) hypertension: Secondary | ICD-10-CM | POA: Diagnosis not present

## 2018-05-22 DIAGNOSIS — I10 Essential (primary) hypertension: Secondary | ICD-10-CM | POA: Diagnosis not present

## 2018-05-22 DIAGNOSIS — I2699 Other pulmonary embolism without acute cor pulmonale: Secondary | ICD-10-CM | POA: Diagnosis not present

## 2018-05-22 DIAGNOSIS — C250 Malignant neoplasm of head of pancreas: Secondary | ICD-10-CM | POA: Diagnosis not present

## 2018-05-22 DIAGNOSIS — I700 Atherosclerosis of aorta: Secondary | ICD-10-CM | POA: Diagnosis not present

## 2018-05-22 NOTE — Progress Notes (Signed)
[place]  [phone] [phone]  Clinic Follow Up Note   Patient Care Team: Glendale Chard, MD as PCP - General (Internal Medicine)   Date of Service:  05/23/2018  CHIEF COMPLAINTS:  F/u metastatic pancreatic cancer  SUMMARY OF ONCOLOGY HISTORY Cancer Staging Adenocarcinoma of head of pancreas [facility]) Staging form: Exocrine Pancreas, AJCC 8th Edition - Clinical: Stage IB (cT2, cN0, cM0) - Unsigned - Pathologic stage from 01/05/2018: Stage III (pT3, pN2, cM0) - Signed by Truitt Merle, MD on 02/10/2018  Oncology History   Cancer Staging Adenocarcinoma of head of pancreas [facility]) Staging form: Exocrine Pancreas, AJCC 8th Edition - Clinical: Stage IB (cT2, cN0, cM0) - Unsigned - Pathologic stage from 01/05/2018: Stage III (pT3, pN2, cM0) - Signed by Truitt Merle, MD on 02/10/2018       Adenocarcinoma of head of pancreas ([place])   11/03/2017 Imaging    CT AP WO CONTRAST IMPRESSION: 1. Findings are concerning for common bile duct obstruction, potentially related to a primary pancreatic neoplasm, as detailed above. At this time this is associated with moderate to severe intra and extrahepatic biliary ductal dilatation. Further evaluation with MRI of the abdomen with and without IV gadolinium with MRCP should be considered in the near future to better evaluate these findings. Alternatively, if the patient is not likely to be able to adequately hold her breath during an MRI examination, pancreatic protocol CT scan would be recommended. 2. Biliary sludge in the gallbladder. No findings to suggest an acute cholecystitis at this time. 3. Small right adrenal adenoma incidentally noted. 4. Aortic atherosclerosis, in addition to 3 vessel coronary artery disease. Assessment for potential risk factor modification, dietary therapy or pharmacologic therapy may be warranted, if clinically indicated.  Aortic Atherosclerosis (ICD10-I70.0).     11/04/2017  Procedure    ERCP per Dr. Benson Norway - The major papilla appeared normal. - A segmental biliary stricture was found. The stricture was malignant appearing. - The entire biliary tree was severely dilated, with a mass causing an obstruction. - A biliary sphincterotomy was performed. - One covered metal stent was placed into the common bile duct.    11/04/2017 Imaging    FINDINGS: Imaging demonstrates cannulation of the common bile duct. The common bile duct is dilated. The inferior common bile duct is narrowed. Final image demonstrates placement of a metallic stent across the area of narrowing into the duodenum.  IMPRESSION: Common bile duct stent placement.    11/11/2017 Procedure    EUS per Dr. Benson Norway Endosonographic Finding Findings: A round mass was identified in the pancreatic head. The mass was hypoechoic. The mass measured 30 mm by 22 mm in maximal cross-sectional diameter. The outer margins were irregular. Fine needle aspiration for cytology was performed. Color Doppler imaging was utilized prior to needle puncture to confirm a lack of significant vascular structures within the needle path. Five passes were made with the 25 gauge needle using a transduodenal approach. A stylet was used. A cytotechnologist was present to evaluate the adequacy of the specimen. The cellularity of the specimen was adequate. Final cytology results are pending. The procedure was technically difficult as the metallic stent obscured views of the lesion. It was also very difficult to obtain adequate views for FNA, however, adequate samples were obtained.    11/11/2017 Pathology Results    Diagnosis FINE NEEDLE ASPIRATION, ENDOSCOPIC, PANCREAS HEAD(SPECIMEN 1 OF 1 COLLECTED 11/11/17): ADENOCARCINOMA. Preliminary Diagnosis Intraoperative Diagnosis: SPINDLE CELL FRAGMENTS AND RARE ATYPICAL EPITHELIAL CELLS.(JDP)  11/24/2017 Initial Diagnosis    Adenocarcinoma of head of pancreas ([place])    12/01/2017 Imaging     CT Abdomen W Contrast 12/01/17 IMPRESSION: 1. 21 x 20 mm pancreatic head/uncinate process lesion without obvious involvement/invasion of the major surrounding vascular structures. 2. Common bile duct stent in place with associated pneumobilia but decompression of the biliary tree. 3. Borderline enlarged celiac axis and periportal lymph nodes. 4. No findings to suggest hepatic metastatic disease. 5. Advanced aortic and branch vessel calcifications but no aneurysm or dissection. 6. Patchy right basilar atelectasis.    01/05/2018 Surgery    LAPAROSCOPY DIAGNOSTIC ERAS PATHWAY and WHIPPLE PROCEDURE by Dr. Barry Dienes and Dr. Johney Maine 01/05/18    01/05/2018 Pathology Results    Diagnosis 01/05/18 1. Gallbladder, and lymph node - CHRONIC CHOLECYSTITIS. - ONE BENIGN PERICYSTIC LYMPH NODE (0/1). - THERE IS NO EVIDENCE OF MALIGNANCY. 2. Lymph node, biopsy, Common Hepatic Artery - THERE IS NO EVIDENCE OF CARCINOMA IN 1 OF 1 LYMPH NODE (0/1). 3. Whipple procedure/resection - INVASIVE ADENOCARCINOMA, MODERATELY DIFFERENTIATED, SPANNING 4.0 CM. - PERINEURAL INVASION IS IDENTIFIED. - LYMPHOVASCULAR INVASION IS IDENTIFIED. - ADENOCARCINOMA IS PRESENT AT THE UNCINATE (RED) MARGIN. - ADENOCARCINOMA EXTENDS INTO PERIPANCREATIC SOFT TISSUE. - METASTATIC CARCINOMA IN 5 OF 29 LYMPH NODES (5/29), WITH EXTRACAPSULAR EXTENSION. - SEE ONCOLOGY TABLE BELOW. 4. Lymph node, biopsy, Portal - THERE IS NO EVIDENCE OF CARCINOMA IN 1 OF 1 LYMPH NODE (0/1). 5. Lymph node, biopsy, Superior mesenteric artery - THERE IS NO EVIDENCE OF CARCINOMA IN 1 OF 1 LYMPH NODE (0/1). 6. Lymph node, biopsy, Ligament of Treitz - THERE IS NO EVIDENCE OF CARCINOMA IN 1 OF 1 LYMPH NODE (0/1)    01/05/2018 - 01/14/2018 Hospital Admission    Admit date: 01/05/18 Admission diagnosis: Fever from infection Additional comments: According to Discharge summary Pt was admitted to the ICU following and she had an epidural for pain control and low dose  fentanyl PCA. Her NGT was able to be pulled on POD 2.5.  She had her foley pulled initially while epidural was still in and this had to be reinserted. She developed a fever, but UA was positive.  Once this was treated, she defervesced.  Culture did not grow anything. Her catheter was pulled after epidural was removed and she did not have any issues.  She had an initial setback when she went from clears to fulls the first time, but n/v resolved.  Her PCA was d/c'd.  She was OOB and worked well with PT.  She had some initial electrolyte abnormalities that resolved.  Her drains were removed without issue.  She had a central line removed without incident.       01/05/2018 Pathology Results    Gallbladder, and lymph node with chronic cholecystitis. One benign pericystic lymph node (0/1). There is no evidence of malignancy. Lymph node, biopsy, Common Hepatic Artery with no evidence of carcinoma in 1 of 1 lymph nodes (0/1). Whipple procedure/resection with invasive adenocarcinoma, moderately differentiated, spanning 4.0 cm. Perineural invasion is identified. Lymphovascular invasion is identified. Adenocarcinoma is present at the uncinate (red) margin. Adenocarcinoma extends into peripancreatic soft tissue. metastatic carcinoma in 5 of 29 lymph nodes (5/29), with extracapsular extension. Lymph node, biopsy, Portal with no evidence of carcinoma in 1 of 1 lymph node (0/1). Lymph node, biopsy, Superior mesenteric artery with no evidence of carcinoma in 1 of 1 lymph node (0/1). Lymph node, biopsy, Ligament of Treitz with no evidence of carcinoma in 1 of 1 lymph node (0/1).  01/05/2018 Cancer Staging    Staging form: Exocrine Pancreas, AJCC 8th Edition - Pathologic stage from 01/05/2018: Stage III (pT3, pN2, cM0) - Signed by Truitt Merle, MD on 02/10/2018    02/16/2018 - 03/02/2018 Chemotherapy    Gemcitabine 3 weeks on and 1 week off for 6 months starting on 02/16/18 and stopped after 3 weeks due to disease progression.       03/09/2018 Imaging    CT CAP W contrast 03/09/18  IMPRESSION: 1. Acute pulmonary embolus demonstrated within the distal right main pulmonary artery extending into the right upper, right middle and right lower lobes. 2. Filling defect within the central right internal jugular vein may represent nonocclusive thrombus. 3. Interval development of multiple enhancing lesions within the liver most compatible with hepatic metastatic disease. 4. Interval Whipple procedure. Small cystic lesion at the site of the pancreatic duodenal anastomosis, potentially postprocedural in etiology. Recommend attention on follow-up. 5. Wall thickening and surrounding fat stranding proximal duodenum, potentially postprocedural in etiology. Recommend attention on follow-up. 6. Critical Value/emergent results were called by telephone at the time of interpretation on 03/09/2018 at 2:49 pm to Dr. Truitt Merle , who verbally acknowledged these results.     03/16/2018 - 04/14/2018 Chemotherapy    Gemcitabine and Abraxane 2 weeks on and 1 week off starting 03/16/18. Due to poor toleration with neutropenic fever, Abraxane was discontinued after 1 cycle and Gemcitabine was discontinued after cycle 2.     05/09/2018 -  Chemotherapy    Xeloda 1000mg  BID for one week on , and one week off, starting 05/09/18     HISTORY OF PRESENTING ILLNESS:  Lauren Richard 82 y.o. female is here because of newly diagnosed pancreas cancer. She presented to ED on 10/18/17 complaining of weakness and chest discomfort, she reported a 10-day history of anorexia with intermittent nausea and vomiting.  She was evaluated and found to be in A. Fib with RVR, EKG without ischemia.  CTA negative for PE.  Symptoms improved and she was discharged home on Xarelto and CCB with follow-up at A. fib clinic on 10/24/2017; found to have abnormal TFTs on workup.  She was found to not have A. fib, Xarelto was stopped.  She presented to her PCP for regularly scheduled  follow-up on 11/03/2017; with complaints of decreased appetite, 5-10 lbs weight loss, fatigue, and intermittent dizziness.  Had syncopal event there and found to have painless jaundice, was directly sent to ED for further workup, LFTs markedly elevated, T bili in the ED was elevated to 19.7.   During hospitalization she had AKI; had noncontrast CT on 11/03/2017 with findings of a mass like area measuring 2.1 x 2.3 x 3.1 cm in the head of the pancreas immediately below the level of the common bile duct truncation as well as findings concerning for common bile duct obstruction with moderate to severe intra-and extrahepatic biliary ductal dilatation.  No lymphadenopathy noted in the abdomen or pelvis.  ERCP showed the entire biliary tree to be severely dilated and diffusely dilated with a mass causing the obstruction. Biliary sphincterotomy was performed and a metal stent was successfully placed into the common bile duct.  She improved clinically and LFTs improved, she was discharged home to follow-up with Dr. Benson Norway outpatient with EUS with FNA.  Biopsy confirmed adenocarcinoma of the pancreatic head.  Past medical history is significant for hyperlipidemia, hypertension, arthritis.  Mild anemia found in July 2018, Hgb 10.2 at that time, 9.6 during recent hospitalization.  She has history  of oral iron supplementation and received a blood transfusion with hysterectomy in the past. Endocrinology consult is pending for hyperthyroidism due to altered TFTs; cardizem was stopped and she was started on beta-blocker for tachycardia during hospitalization.  She has positive family history for renal cancer in her mother and lung cancer in her father, he was a smoker.  She has occasional wine socially, never smoked.  She is widowed, did not have children; lives alone, independent of all ADLs, she does not drive but has family close by who drive and support her.  Prior to her illness she exercised at the [employer] twice weekly; remains  active in her church community.   Today she feels well, her appetite is returning.  She feels more like her usual self.  Denies pain, nausea.  BMs are regular.  She has mild back pain at her baseline, controlled with Tylenol as needed.  CURRENT THERAPY: Xeloda 1000mg  BID for one week on , and one week off, starting 05/09/18   INTERVAL HISTORY:   RASHAY BARNETTE female is here for follow up of her pancreatic cancer and ongoing treatment. She presents to the clinic today accompanied by her son.  She notes she is doing well overall. She notes she lost appetite with Xeloda and has lost weight. She started Xeloda on 05/09/18 and restarted her next cycle today. She notes she has plenty of boost at home and is willing to increase.  Her BP at 114/112 today. She denies chest pain or dizziness. She notes she is fatigued lately but still able to do what she needs to do.  She notes she was prescribed mirtazapine but never got it. She only takes tramadol as needed.       MEDICAL HISTORY:  Past Medical History:  Diagnosis Date  . Adenocarcinoma of head of pancreas ([place]) 11/24/2017  . Arthritis    "all over" (04/26/2017); knees, shoulder  . Essential hypertension 03/03/2017  . Family history of breast cancer   . Family history of kidney cancer   . Family history of pancreatic cancer   . Family history of prostate cancer   . History of blood transfusion 1979   "when I had my hysterectomy"  . Hyperlipidemia   . Hypothyroidism   . Obesity 03/03/2017  . Osteoarthritis 03/03/2017  . Pneumonia    as a child    SURGICAL HISTORY: Past Surgical History:  Procedure Laterality Date  . BREAST CYST EXCISION Right    "cut 6 cysts out"  . CYST EXCISION Left    "off my toe"  . DILATION AND CURETTAGE OF UTERUS     "before hysterectomy"  . ERCP N/A 11/04/2017   Procedure: ENDOSCOPIC RETROGRADE CHOLANGIOPANCREATOGRAPHY (ERCP);  Surgeon: Carol Ada, MD;  Location: [facility];  Service: Endoscopy;   Laterality: N/A;  . ESOPHAGOGASTRODUODENOSCOPY N/A 04/24/2018   Procedure: ESOPHAGOGASTRODUODENOSCOPY (EGD);  Surgeon: Jerene Bears, MD;  Location: [facility];  Service: Gastroenterology;  Laterality: N/A;  . EUS N/A 11/11/2017   Procedure: UPPER ENDOSCOPIC ULTRASOUND (EUS) LINEAR;  Surgeon: Carol Ada, MD;  Location: [facility];  Service: Endoscopy;  Laterality: N/A;  . FINE NEEDLE ASPIRATION N/A 11/11/2017   Procedure: FINE NEEDLE ASPIRATION (FNA) LINEAR;  Surgeon: Carol Ada, MD;  Location: [facility];  Service: Endoscopy;  Laterality: N/A;  . INGUINAL HERNIA REPAIR Right   . LAPAROSCOPY N/A 01/05/2018   Procedure: LAPAROSCOPY DIAGNOSTIC ERAS PATHWAY;  Surgeon: Stark Klein, MD;  Location: [facility];  Service: General;  Laterality: N/A;  [place]  .  PORTACATH PLACEMENT Left 04/11/2018   Procedure: INSERTION PORT-A-CATH;  Surgeon: Stark Klein, MD;  Location: [facility];  Service: General;  Laterality: Left;  . REDUCTION MAMMAPLASTY Bilateral 1980  . TONSILLECTOMY     "when I small"  . TOTAL ABDOMINAL HYSTERECTOMY  1979  . TOTAL KNEE ARTHROPLASTY Left 04/26/2017   Procedure: LEFT TOTAL KNEE ARTHROPLASTY;  Surgeon: [name], MD;  Location: [facility];  Service: Orthopedics;  Laterality: Left;  . WHIPPLE PROCEDURE N/A 01/05/2018   Procedure: WHIPPLE PROCEDURE;  Surgeon: Stark Klein, MD;  Location: [facility];  Service: General;  Laterality: N/A;    SOCIAL HISTORY: Social History   Socioeconomic History  . Marital status: Widowed    Spouse name: Not on file  . Number of children: Not on file  . Years of education: Not on file  . Highest education level: Not on file  Occupational History  . Not on file  Social Needs  . Financial resource strain: Not on file  . Food insecurity:    Worry: Not on file    Inability: Not on file  . Transportation needs:    Medical: Not on file    Non-medical: Not on file  Tobacco Use  . Smoking status: Never Smoker  . Smokeless tobacco: Never Used   Substance and Sexual Activity  . Alcohol use: No    Alcohol/week: 0.0 standard drinks    Frequency: Never    Comment: occasional glass of wine   . Drug use: No  . Sexual activity: Never  Lifestyle  . Physical activity:    Days per week: Not on file    Minutes per session: Not on file  . Stress: Not on file  Relationships  . Social connections:    Talks on phone: Not on file    Gets together: Not on file    Attends religious service: Not on file    Active member of club or organization: Not on file    Attends meetings of clubs or organizations: Not on file    Relationship status: Not on file  . Intimate partner violence:    Fear of current or ex partner: Not on file    Emotionally abused: Not on file    Physically abused: Not on file    Forced sexual activity: Not on file  Other Topics Concern  . Not on file  Social History Narrative  . Not on file    FAMILY HISTORY: Family History  Problem Relation Age of Onset  . Kidney cancer Mother 88  . Lung cancer Father 26       lung, smoker  . Congestive Heart Failure Sister   . Asthma Sister   . Atrial fibrillation Sister   . Hypertension Sister   . Diabetes Sister   . Head & neck cancer Paternal Grandmother   . Cancer Maternal Uncle        type unk  . Pancreatic cancer Paternal Aunt        dx >50  . Cancer Paternal Uncle        type unk  . Breast cancer Cousin 92  . Prostate cancer Other 66       had robotic surgery    ALLERGIES:  has No Known Allergies.  MEDICATIONS:  Current Outpatient Medications  Medication Sig Dispense Refill  . capecitabine (XELODA) 500 MG tablet Take 2 tablets (1000mg ) by mouth 2 times daily, within 30 min of finishing food. Take on days 1-7 and 15-21 of  each 28 day cycle 56 tablet 0  . Cholecalciferol (VITAMIN D) 2000 units tablet Take 2,000 Units by mouth daily.    . feeding supplement, ENSURE ENLIVE, (ENSURE ENLIVE) LIQD Take 237 mLs by mouth 3 (three) times daily between meals. 90  Bottle 0  . gabapentin (NEURONTIN) 100 MG capsule Take 2 capsules (200 mg total) by mouth 2 (two) times daily. 30 capsule 0  . lidocaine-prilocaine (EMLA) cream Apply 1 application topically as needed. 30 g 1  . mirtazapine (REMERON) 7.5 MG tablet Take 1 tablet (7.5 mg total) by mouth at bedtime. 30 tablet 1  . nystatin (MYCOSTATIN) 100000 UNIT/ML suspension Take 5 mLs (500,000 Units total) by mouth 4 (four) times daily. 100 mL 0  . ondansetron (ZOFRAN) 8 MG tablet Take 8 mg by mouth 2 (two) times daily as needed for nausea/vomiting.  1  . pantoprazole (PROTONIX) 40 MG tablet Take 1 tablet (40 mg total) by mouth daily at 12 noon. 30 tablet 0  . potassium chloride SA (K-DUR,KLOR-CON) 20 MEQ tablet Take 2 tablets (40 mEq total) by mouth daily. 60 tablet 2  . PRESCRIPTION MEDICATION Apply 1 application topically 4 (four) times daily as needed (for back pain). Dic 3%, Bac 2%, lid - pril 2-5%, Menth 1% cream     . prochlorperazine (COMPAZINE) 10 MG tablet Take 10 mg by mouth every 6 (six) hours as needed for nausea or vomiting.    . rivaroxaban (XARELTO) 20 MG TABS tablet Take 1 tablet (20 mg total) by mouth daily with supper. 30 tablet 0  . traMADol (ULTRAM) 50 MG tablet Take 50 mg by mouth every 12 (twelve) hours as needed (for pain.).    Marland Kitchen trolamine salicylate (ASPERCREME) 10 % cream Apply 1 application topically 4 (four) times daily as needed (for knee pain/muscle pain.).      No current facility-administered medications for this visit.    Facility-Administered Medications Ordered in Other Visits  Medication Dose Route Frequency Provider Last Rate Last Dose  . potassium chloride SA (K-DUR,KLOR-CON) CR tablet 40 mEq  40 mEq Oral Once Truitt Merle, MD        REVIEW OF SYSTEMS:  Constitutional: Denies fevers, chills or abnormal night sweats (+) very low appetite (+) weight loss (+) fatigue  Eyes: Denies blurriness of vision, double vision or watery eyes Ears, nose, mouth, throat, and face: Denies  mucositis or sore throat Respiratory: Denies cough, dyspnea or wheezes Cardiovascular: Denies palpitation, chest discomfort or lower extremity swelling Gastrointestinal:  Denies constipation, diarrhea, heartburn or change in bowel habits  Skin: Denies abnormal skin rashes   Lymphatics: Denies new lymphadenopathy or easy bruising Neurological:Denies numbness, tingling or new weaknesses Behavioral/Psych: Mood is stable, no new changes  All other systems were reviewed with the patient and are negative.  PHYSICAL EXAMINATION:  ECOG PERFORMANCE STATUS: 2  Vitals:   05/23/18 0926 05/23/18 0944  BP: (!) 114/112 (!) 93/59  Pulse: (!) 125 62  Resp: 14 18  Temp: 98 F (36.7 C)   SpO2: 96%    Filed Weights   05/23/18 0926  Weight: 149 lb 11.2 oz (67.9 kg)    GENERAL:alert, no distress and comfortable (+) uses walker to ambulate  SKIN: skin color, texture, turgor are normal, no rashes or significant lesions, port site is clean. EYES: normal, conjunctiva are pink and non-injected OROPHARYNX:no exudate, no erythema and lips, buccal mucosa, and tongue normal  NECK: supple, thyroid normal size, non-tender, without nodularity LYMPH:  no palpable cervical, supraclavicular, axillary, or  inguinal lymphadenopathy  LUNGS: clear to auscultation bilaterally with normal breathing effort HEART: regular rate & rhythm and no murmurs and no lower extremity edema ABDOMEN:abdomen soft, non-tender and normal bowel sounds.  No hepatomegaly. Midline incision from umbilical that is healed well with minimal scar tissues.  Musculoskeletal:no cyanosis of digits and no clubbing, mild bilateral lower extremity edema PSYCH: alert & oriented x 3 with fluent speech NEURO: no focal motor/sensory deficits  LABORATORY DATA:  I have reviewed the data as listed CBC Latest Ref Rng & Units 05/23/2018 05/01/2018 04/25/2018  WBC 3.9 - 10.3 K/uL 8.2 6.0 4.4  Hemoglobin 11.6 - 15.9 g/dL 11.7 9.8(L) 7.6(L)  Hematocrit 34.8 -  46.6 % 36.0 30.9(L) 23.4(L)  Platelets 145 - 400 K/uL 374 427(H) 204   CMP Latest Ref Rng & Units 05/23/2018 05/01/2018 04/24/2018  Glucose 70 - 99 mg/dL 101(H) 96 90  BUN 8 - 23 mg/dL 8 6(L) <5(L)  Creatinine 0.44 - 1.00 mg/dL 0.74 0.66 0.61  Sodium 135 - 145 mmol/L 138 138 137  Potassium 3.5 - 5.1 mmol/L 2.8(LL) 3.6 3.4(L)  Chloride 98 - 111 mmol/L 104 109 111  CO2 22 - 32 mmol/L 19(L) 21(L) 21(L)  Calcium 8.9 - 10.3 mg/dL 8.6(L) 8.7(L) 8.1(L)  Total Protein 6.5 - 8.1 g/dL 8.3(H) 6.8 -  Total Bilirubin 0.3 - 1.2 mg/dL 2.1(H) 0.7 -  Alkaline Phos 38 - 126 U/L 254(H) 193(H) -  AST 15 - 41 U/L 121(H) 66(H) -  ALT 0 - 44 U/L 56(H) 33 -   Tumor Markers CA 19.9  Results for EESHA, SCHMALTZ (MRN [id number]) as of 04/26/2018 16:54  Ref. Range 11/24/2017 15:08 02/16/2018 11:26 03/16/2018 09:26 04/14/2018 11:43  CA 19-9 Latest Ref Range: 0 - 35 U/mL 11 198 (H) 126 (H) 60 (H)  PENDING 05/23/18   PATHOLOGY Diagnosis 11/11/17 FINE NEEDLE ASPIRATION, ENDOSCOPIC, PANCREAS HEAD(SPECIMEN 1 OF 1 COLLECTED 11/11/17): ADENOCARCINOMA. Preliminary Diagnosis Intraoperative Diagnosis: SPINDLE CELL FRAGMENTS AND RARE ATYPICAL EPITHELIAL CELLS.(JDP)  Whipple Procedure, 01/05/2018 1. Gallbladder, and lymph node - CHRONIC CHOLECYSTITIS. - ONE BENIGN PERICYSTIC LYMPH NODE (0/1). - THERE IS NO EVIDENCE OF MALIGNANCY. 2. Lymph node, biopsy, Common Hepatic Artery - THERE IS NO EVIDENCE OF CARCINOMA IN 1 OF 1 LYMPH NODE (0/1). 3. Whipple procedure/resection - INVASIVE ADENOCARCINOMA, MODERATELY DIFFERENTIATED, SPANNING 4.0 CM. - PERINEURAL INVASION IS IDENTIFIED. - LYMPHOVASCULAR INVASION IS IDENTIFIED. - ADENOCARCINOMA IS PRESENT AT THE UNCINATE (RED) MARGIN. - ADENOCARCINOMA EXTENDS INTO PERIPANCREATIC SOFT TISSUE. - METASTATIC CARCINOMA IN 5 OF 29 LYMPH NODES (5/29), WITH EXTRACAPSULAR EXTENSION. - SEE ONCOLOGY TABLE BELOW. 4. Lymph node, biopsy, Portal - THERE IS NO EVIDENCE OF CARCINOMA IN 1 OF 1 LYMPH NODE  (0/1). 5. Lymph node, biopsy, Superior mesenteric artery - THERE IS NO EVIDENCE OF CARCINOMA IN 1 OF 1 LYMPH NODE (0/1). 6. Lymph node, biopsy, Ligament of Treitz - THERE IS NO EVIDENCE OF CARCINOMA IN 1 OF 1 LYMPH NODE (0/1).  RADIOGRAPHIC STUDIES:   I have personally reviewed the radiological images as listed and agreed with the findings in the report. No results found.  ASSESSMENT & PLAN:  Lauren Richard is a pleasant 82 y.o. AAF who presented with syncopal event and obstructive juandice found to have malignant pancreatic head mass.   1. 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 She has what appears to  be localized adenocarcinoma on pancreatic head.  -She underwent Whipple surgery on 01/05/18. She has recovered well from surgery, except not gaining weight back. -I previously discussed the recent pathology results with the patient and her family member.  She has locally advanced disease with 5+ lymph nodes, and positive margin at the uncinate -It is extremely high risk of recurrence, I recommended patient to start adjuvant gemcitabine. -Patient started single agent adjuvant chemotherapy gemcitabine 3 weeks on and 1 week off 02/16/18-03/02/18, tolerated well, without significant side effects. -Due to the elevated CA 19.9 tumor marker on 02/16/2018 with results of 198. -We previously discussed her CT CAP from 03/09/18 which showed PE in right pulmonary artery spanning to smaller blood vessels. Scan also showed growth in her liver lesions compatible with metastatic disease.  -Due to her advanced age, newly diagnosed PE which requires anticoagulation, and typical image findings and significantly elevated CA19.9, I think her metastatic disease is pretty certain, we decided not pursue biopsy to confirm. -With  metastatic disease, her cancer is no longer curable and surgery and targeted radiation are not option for now.  -She started Abraxane along with Gemcitabine for 2 weeks on 1 week off on 03/16/18 and pt did not tolerated well.  She previously ended up in the hospital for neutropenic fever, severe fatigue, diarrhea, after the first cycle. Abraxane was discontinued.  -She started cycle 2 gemcitabine alone on 04/14/18, but developed significant anemia, near syncope episode, and required hospitalization again. -I did not think she is a candidate for any more intravenous chemo.  We discussed the option of supportive care alone, versus single agent Xeloda, and she decided to try Xeloda.due to her advance age, I recommend her to try one week on and one week off  -She tolerated first cycle Xeloda well except loss of appetite and significant weight loss.  -Lab reviewed, she has developed significant hypokalemia, hyperbilirubinemia, which could be related to Xeloda or liver metastasis.  I recommend her to stop Xeloda (she started yesterday),and repeat lab in one week -If she recovers well, and LFTs return to normal next week, I will let her try cycle 2 Xeloda.  If she continues to lose weight after cycle 2, I recommend her to stop. -F/u in 2 weeks    2. PE in right pulmonary artery extended to smaller blood vessels  -seen on 03/09/18 CT scan, incidental finding, patient is not symptomatic. No signs of right heart strain on CT. -She has been on Xarelto, tolerating well. She does not need oxygen. -Continue Xarelto 20 mg daily. This was held when she was recently admitted for severe anemia. She restarted on 05/01/18. Her endoscopy work-up was negative for bleeding.   3. Weight loss, decreased appetite, fatigue  -She previously had a 13 lbs weight loss in 2 months, since symptom onset and then an additional 16 lbs weight loss since her surgery -Patient to follow up with nutritionist as needed, last f/u on  02/15/2018. -She continued to have no appetite and moderate weight loss. I recommend she eat several small meals/snacks a day along with boost and increase water intake. Will monitor.  -I previously recommended she start her Mirtazapine prescription once daily for now and if tolerable she can increase to BID.  -With Xeloda her appetite and weight decreased. I resent her mirtazapine prescription as she was not able to pick up before.  -I also strongly encouraged her to increase her boost intake to 4-5 a day when she is not eating by mouth.  4. HTN, hyperlipidemia  -Managed with PCP -We will monitor her blood pressure closely during her chemotherapy, and adjust her medication if needed -Her BP has been slightly low lately, likely related to decreased oral intake,she is not on any BP meds    5. Transaminitis and hyperbilirubinemia -possible related to chemotherapy vs liver metastasis  -Continue observation and follow-up closely   6. Social Support -Patient lives alone, her niece and nephew will bring her to the office visit and treatment, and support her at home. -Her sisters will rotate staying and taking care of her.  -She will continue Homecare and PT at home   7. Hypokalemia -K 2.8 today, no significant diarrhea. -She is on potassium chloride 40 mEq daily now, will increase to 68meq daily until next lab in a week   8. Goal of care discussion  -We again discussed the incurable nature of her metastatic pancreatic cancer, and the overall poor prognosis, especially if she does not have good response to chemotherapy or progress on chemo -The patient understands the goal of care is palliative. -she is full code now   PLAN: -hold on Xeloda for now, and repeat lab in one week. If LFTs significantly improve, will start cycle 2 next week -Lab and f/u on 8/26    No orders of the defined types were placed in this encounter.   All questions were answered. The patient knows to call the clinic  with any problems, questions or concerns. I spent 20 minutes counseling the patient face to face. The total time spent in the appointment was 25 minutes and more than 50% was on counseling and review of test results.  Oneal Deputy, am acting as scribe for Truitt Merle, MD.   I have reviewed the above documentation for accuracy and completeness, and I agree with the above.     Truitt Merle, MD 05/23/2018

## 2018-05-23 ENCOUNTER — Inpatient Hospital Stay: Payer: PPO

## 2018-05-23 ENCOUNTER — Telehealth: Payer: Self-pay | Admitting: Emergency Medicine

## 2018-05-23 ENCOUNTER — Telehealth: Payer: Self-pay

## 2018-05-23 ENCOUNTER — Encounter: Payer: Self-pay | Admitting: Hematology

## 2018-05-23 ENCOUNTER — Inpatient Hospital Stay: Payer: PPO | Attending: Nurse Practitioner | Admitting: Hematology

## 2018-05-23 VITALS — BP 93/59 | HR 62 | Temp 98.0°F | Resp 18 | Ht 67.0 in | Wt 149.7 lb

## 2018-05-23 DIAGNOSIS — R630 Anorexia: Secondary | ICD-10-CM

## 2018-05-23 DIAGNOSIS — I700 Atherosclerosis of aorta: Secondary | ICD-10-CM | POA: Insufficient documentation

## 2018-05-23 DIAGNOSIS — Z7901 Long term (current) use of anticoagulants: Secondary | ICD-10-CM | POA: Insufficient documentation

## 2018-05-23 DIAGNOSIS — I10 Essential (primary) hypertension: Secondary | ICD-10-CM | POA: Diagnosis not present

## 2018-05-23 DIAGNOSIS — M549 Dorsalgia, unspecified: Secondary | ICD-10-CM | POA: Insufficient documentation

## 2018-05-23 DIAGNOSIS — C250 Malignant neoplasm of head of pancreas: Secondary | ICD-10-CM | POA: Diagnosis not present

## 2018-05-23 DIAGNOSIS — Z79899 Other long term (current) drug therapy: Secondary | ICD-10-CM

## 2018-05-23 DIAGNOSIS — D649 Anemia, unspecified: Secondary | ICD-10-CM | POA: Diagnosis not present

## 2018-05-23 DIAGNOSIS — E785 Hyperlipidemia, unspecified: Secondary | ICD-10-CM | POA: Insufficient documentation

## 2018-05-23 DIAGNOSIS — M1990 Unspecified osteoarthritis, unspecified site: Secondary | ICD-10-CM | POA: Diagnosis not present

## 2018-05-23 DIAGNOSIS — Z95828 Presence of other vascular implants and grafts: Secondary | ICD-10-CM

## 2018-05-23 LAB — CMP (CANCER CENTER ONLY)
ALK PHOS: 254 U/L — AB (ref 38–126)
ALT: 56 U/L — ABNORMAL HIGH (ref 0–44)
AST: 121 U/L — AB (ref 15–41)
Albumin: 2.1 g/dL — ABNORMAL LOW (ref 3.5–5.0)
Anion gap: 15 (ref 5–15)
BILIRUBIN TOTAL: 2.1 mg/dL — AB (ref 0.3–1.2)
BUN: 8 mg/dL (ref 8–23)
CALCIUM: 8.6 mg/dL — AB (ref 8.9–10.3)
CO2: 19 mmol/L — ABNORMAL LOW (ref 22–32)
Chloride: 104 mmol/L (ref 98–111)
Creatinine: 0.74 mg/dL (ref 0.44–1.00)
GFR, Est AFR Am: >60 mL/min (ref >60)
Glucose, Bld: 101 mg/dL — ABNORMAL HIGH (ref 70–99)
Potassium: 2.8 mmol/L — CL (ref 3.5–5.1)
Sodium: 138 mmol/L (ref 135–145)
TOTAL PROTEIN: 8.3 g/dL — AB (ref 6.5–8.1)

## 2018-05-23 LAB — CBC WITH DIFFERENTIAL (CANCER CENTER ONLY)
BASOS ABS: 0 10*3/uL (ref 0.0–0.1)
BASOS PCT: 0 %
EOS PCT: 0 %
Eosinophils Absolute: 0 10*3/uL (ref 0.0–0.5)
HCT: 36 % (ref 34.8–46.6)
Hemoglobin: 11.7 g/dL (ref 11.6–15.9)
Lymphocytes Relative: 23 %
Lymphs Abs: 1.9 10*3/uL (ref 0.9–3.3)
MCH: 32.7 pg (ref 25.1–34.0)
MCHC: 32.5 g/dL (ref 31.5–36.0)
MCV: 100.6 fL (ref 79.5–101.0)
Monocytes Absolute: 0.5 10*3/uL (ref 0.1–0.9)
Monocytes Relative: 6 %
Neutro Abs: 5.8 10*3/uL (ref 1.5–6.5)
Neutrophils Relative %: 71 %
PLATELETS: 374 10*3/uL (ref 145–400)
RBC: 3.58 MIL/uL — ABNORMAL LOW (ref 3.70–5.45)
RDW: 15.5 % — AB (ref 11.2–14.5)
WBC Count: 8.2 10*3/uL (ref 3.9–10.3)

## 2018-05-23 MED ORDER — HEPARIN SOD (PORK) LOCK FLUSH 100 UNIT/ML IV SOLN
500.0000 [IU] | Freq: Once | INTRAVENOUS | Status: AC
  Administered 2018-05-23: 500 [IU] via INTRAVENOUS
  Filled 2018-05-23: qty 5

## 2018-05-23 MED ORDER — MIRTAZAPINE 7.5 MG PO TABS: 7.5000 mg | ORAL_TABLET | Freq: Every day | ORAL | 1 refills | Status: DC

## 2018-05-23 MED ORDER — SODIUM CHLORIDE 0.9% FLUSH
10.0000 mL | INTRAVENOUS | Status: DC | PRN
  Administered 2018-05-23: 10 mL via INTRAVENOUS
  Filled 2018-05-23: qty 10

## 2018-05-23 NOTE — Telephone Encounter (Signed)
Printed avs and calender of upcoming appointment, per 8/13 los

## 2018-05-23 NOTE — Telephone Encounter (Signed)
Panic potassium - 2.8. Result given to MD.

## 2018-05-24 LAB — CANCER ANTIGEN 19-9: CAN 19-9: 229 U/mL — AB (ref 0–35)

## 2018-05-29 DIAGNOSIS — I700 Atherosclerosis of aorta: Secondary | ICD-10-CM | POA: Diagnosis not present

## 2018-05-29 DIAGNOSIS — E785 Hyperlipidemia, unspecified: Secondary | ICD-10-CM | POA: Diagnosis not present

## 2018-05-29 DIAGNOSIS — I10 Essential (primary) hypertension: Secondary | ICD-10-CM | POA: Diagnosis not present

## 2018-05-29 DIAGNOSIS — C250 Malignant neoplasm of head of pancreas: Secondary | ICD-10-CM | POA: Diagnosis not present

## 2018-05-29 DIAGNOSIS — I2699 Other pulmonary embolism without acute cor pulmonale: Secondary | ICD-10-CM | POA: Diagnosis not present

## 2018-05-30 ENCOUNTER — Inpatient Hospital Stay: Payer: PPO

## 2018-05-30 DIAGNOSIS — C250 Malignant neoplasm of head of pancreas: Secondary | ICD-10-CM | POA: Diagnosis not present

## 2018-05-30 LAB — CBC WITH DIFFERENTIAL (CANCER CENTER ONLY)
Basophils Absolute: 0 10*3/uL (ref 0.0–0.1)
Basophils Relative: 1 %
EOS ABS: 0 10*3/uL (ref 0.0–0.5)
EOS PCT: 1 %
HCT: 37.9 % (ref 34.8–46.6)
Hemoglobin: 12.4 g/dL (ref 11.6–15.9)
Lymphocytes Relative: 27 %
Lymphs Abs: 1.6 10*3/uL (ref 0.9–3.3)
MCH: 33.4 pg (ref 25.1–34.0)
MCHC: 32.6 g/dL (ref 31.5–36.0)
MCV: 102.5 fL — ABNORMAL HIGH (ref 79.5–101.0)
Monocytes Absolute: 0.4 10*3/uL (ref 0.1–0.9)
Monocytes Relative: 7 %
Neutro Abs: 3.9 10*3/uL (ref 1.5–6.5)
Neutrophils Relative %: 64 %
PLATELETS: 419 10*3/uL — AB (ref 145–400)
RBC: 3.7 MIL/uL (ref 3.70–5.45)
RDW: 16.5 % — ABNORMAL HIGH (ref 11.2–14.5)
WBC: 6 10*3/uL (ref 3.9–10.3)

## 2018-05-30 LAB — CMP (CANCER CENTER ONLY)
ALK PHOS: 246 U/L — AB (ref 38–126)
ALT: 48 U/L — AB (ref 0–44)
AST: 106 U/L — ABNORMAL HIGH (ref 15–41)
Albumin: 2.1 g/dL — ABNORMAL LOW (ref 3.5–5.0)
Anion gap: 12 (ref 5–15)
BILIRUBIN TOTAL: 1.1 mg/dL (ref 0.3–1.2)
BUN: 6 mg/dL — AB (ref 8–23)
CO2: 19 mmol/L — ABNORMAL LOW (ref 22–32)
CREATININE: 0.82 mg/dL (ref 0.44–1.00)
Calcium: 9.2 mg/dL (ref 8.9–10.3)
Chloride: 105 mmol/L (ref 98–111)
GFR, Est AFR Am: >60 mL/min (ref >60)
Glucose, Bld: 97 mg/dL (ref 70–99)
Potassium: 4.3 mmol/L (ref 3.5–5.1)
Sodium: 136 mmol/L (ref 135–145)
TOTAL PROTEIN: 8.6 g/dL — AB (ref 6.5–8.1)

## 2018-06-05 ENCOUNTER — Emergency Department (HOSPITAL_COMMUNITY): Payer: PPO

## 2018-06-05 ENCOUNTER — Other Ambulatory Visit: Payer: Self-pay

## 2018-06-05 ENCOUNTER — Inpatient Hospital Stay (HOSPITAL_COMMUNITY)
Admission: EM | Admit: 2018-06-05 | Discharge: 2018-06-11 | DRG: 280 | Disposition: A | Discharge status: Home Health | Payer: PPO | Attending: Internal Medicine | Admitting: Internal Medicine

## 2018-06-05 ENCOUNTER — Inpatient Hospital Stay: Payer: PPO | Admitting: Hematology

## 2018-06-05 ENCOUNTER — Inpatient Hospital Stay: Payer: PPO

## 2018-06-05 ENCOUNTER — Encounter (HOSPITAL_COMMUNITY): Payer: Self-pay

## 2018-06-05 DIAGNOSIS — R41841 Cognitive communication deficit: Secondary | ICD-10-CM | POA: Diagnosis not present

## 2018-06-05 DIAGNOSIS — M549 Dorsalgia, unspecified: Secondary | ICD-10-CM | POA: Diagnosis not present

## 2018-06-05 DIAGNOSIS — I255 Ischemic cardiomyopathy: Secondary | ICD-10-CM | POA: Diagnosis not present

## 2018-06-05 DIAGNOSIS — I4891 Unspecified atrial fibrillation: Secondary | ICD-10-CM | POA: Diagnosis not present

## 2018-06-05 DIAGNOSIS — Z90411 Acquired partial absence of pancreas: Secondary | ICD-10-CM | POA: Diagnosis not present

## 2018-06-05 DIAGNOSIS — Z86711 Personal history of pulmonary embolism: Secondary | ICD-10-CM

## 2018-06-05 DIAGNOSIS — Z6823 Body mass index (BMI) 23.0-23.9, adult: Secondary | ICD-10-CM | POA: Diagnosis not present

## 2018-06-05 DIAGNOSIS — R945 Abnormal results of liver function studies: Secondary | ICD-10-CM | POA: Diagnosis not present

## 2018-06-05 DIAGNOSIS — I213 ST elevation (STEMI) myocardial infarction of unspecified site: Principal | ICD-10-CM | POA: Diagnosis present

## 2018-06-05 DIAGNOSIS — Z96652 Presence of left artificial knee joint: Secondary | ICD-10-CM | POA: Diagnosis present

## 2018-06-05 DIAGNOSIS — K219 Gastro-esophageal reflux disease without esophagitis: Secondary | ICD-10-CM | POA: Diagnosis not present

## 2018-06-05 DIAGNOSIS — M6281 Muscle weakness (generalized): Secondary | ICD-10-CM | POA: Diagnosis not present

## 2018-06-05 DIAGNOSIS — R531 Weakness: Secondary | ICD-10-CM | POA: Diagnosis not present

## 2018-06-05 DIAGNOSIS — M2550 Pain in unspecified joint: Secondary | ICD-10-CM | POA: Diagnosis not present

## 2018-06-05 DIAGNOSIS — I110 Hypertensive heart disease with heart failure: Secondary | ICD-10-CM | POA: Diagnosis present

## 2018-06-05 DIAGNOSIS — Z90710 Acquired absence of both cervix and uterus: Secondary | ICD-10-CM | POA: Diagnosis not present

## 2018-06-05 DIAGNOSIS — I10 Essential (primary) hypertension: Secondary | ICD-10-CM | POA: Diagnosis not present

## 2018-06-05 DIAGNOSIS — E876 Hypokalemia: Secondary | ICD-10-CM | POA: Diagnosis present

## 2018-06-05 DIAGNOSIS — R748 Abnormal levels of other serum enzymes: Secondary | ICD-10-CM | POA: Diagnosis not present

## 2018-06-05 DIAGNOSIS — R918 Other nonspecific abnormal finding of lung field: Secondary | ICD-10-CM | POA: Diagnosis not present

## 2018-06-05 DIAGNOSIS — I2510 Atherosclerotic heart disease of native coronary artery without angina pectoris: Secondary | ICD-10-CM | POA: Diagnosis not present

## 2018-06-05 DIAGNOSIS — I700 Atherosclerosis of aorta: Secondary | ICD-10-CM | POA: Diagnosis not present

## 2018-06-05 DIAGNOSIS — E785 Hyperlipidemia, unspecified: Secondary | ICD-10-CM | POA: Diagnosis present

## 2018-06-05 DIAGNOSIS — Z79899 Other long term (current) drug therapy: Secondary | ICD-10-CM | POA: Diagnosis not present

## 2018-06-05 DIAGNOSIS — R112 Nausea with vomiting, unspecified: Secondary | ICD-10-CM | POA: Diagnosis not present

## 2018-06-05 DIAGNOSIS — E871 Hypo-osmolality and hyponatremia: Secondary | ICD-10-CM | POA: Diagnosis present

## 2018-06-05 DIAGNOSIS — R0600 Dyspnea, unspecified: Secondary | ICD-10-CM | POA: Diagnosis present

## 2018-06-05 DIAGNOSIS — R2689 Other abnormalities of gait and mobility: Secondary | ICD-10-CM | POA: Diagnosis not present

## 2018-06-05 DIAGNOSIS — R000 Tachycardia, unspecified: Secondary | ICD-10-CM | POA: Diagnosis present

## 2018-06-05 DIAGNOSIS — I480 Paroxysmal atrial fibrillation: Secondary | ICD-10-CM | POA: Diagnosis not present

## 2018-06-05 DIAGNOSIS — I200 Unstable angina: Secondary | ICD-10-CM | POA: Diagnosis not present

## 2018-06-05 DIAGNOSIS — Z7401 Bed confinement status: Secondary | ICD-10-CM | POA: Diagnosis not present

## 2018-06-05 DIAGNOSIS — R278 Other lack of coordination: Secondary | ICD-10-CM | POA: Diagnosis not present

## 2018-06-05 DIAGNOSIS — Z8507 Personal history of malignant neoplasm of pancreas: Secondary | ICD-10-CM | POA: Diagnosis not present

## 2018-06-05 DIAGNOSIS — R778 Other specified abnormalities of plasma proteins: Secondary | ICD-10-CM | POA: Diagnosis present

## 2018-06-05 DIAGNOSIS — R001 Bradycardia, unspecified: Secondary | ICD-10-CM | POA: Diagnosis not present

## 2018-06-05 DIAGNOSIS — E440 Moderate protein-calorie malnutrition: Secondary | ICD-10-CM | POA: Diagnosis not present

## 2018-06-05 DIAGNOSIS — R7989 Other specified abnormal findings of blood chemistry: Secondary | ICD-10-CM | POA: Diagnosis present

## 2018-06-05 DIAGNOSIS — R498 Other voice and resonance disorders: Secondary | ICD-10-CM | POA: Diagnosis not present

## 2018-06-05 DIAGNOSIS — I5023 Acute on chronic systolic (congestive) heart failure: Secondary | ICD-10-CM | POA: Diagnosis present

## 2018-06-05 DIAGNOSIS — R2681 Unsteadiness on feet: Secondary | ICD-10-CM | POA: Diagnosis not present

## 2018-06-05 DIAGNOSIS — R740 Nonspecific elevation of levels of transaminase and lactic acid dehydrogenase [LDH]: Secondary | ICD-10-CM | POA: Diagnosis not present

## 2018-06-05 DIAGNOSIS — C799 Secondary malignant neoplasm of unspecified site: Secondary | ICD-10-CM | POA: Diagnosis not present

## 2018-06-05 DIAGNOSIS — Z7901 Long term (current) use of anticoagulants: Secondary | ICD-10-CM | POA: Diagnosis not present

## 2018-06-05 DIAGNOSIS — C787 Secondary malignant neoplasm of liver and intrahepatic bile duct: Secondary | ICD-10-CM | POA: Diagnosis present

## 2018-06-05 DIAGNOSIS — C250 Malignant neoplasm of head of pancreas: Secondary | ICD-10-CM | POA: Diagnosis not present

## 2018-06-05 DIAGNOSIS — I5181 Takotsubo syndrome: Secondary | ICD-10-CM | POA: Diagnosis not present

## 2018-06-05 DIAGNOSIS — R627 Adult failure to thrive: Secondary | ICD-10-CM | POA: Diagnosis present

## 2018-06-05 DIAGNOSIS — I428 Other cardiomyopathies: Secondary | ICD-10-CM | POA: Diagnosis not present

## 2018-06-05 LAB — CBC WITH DIFFERENTIAL/PLATELET
BASOS PCT: 0 %
Basophils Absolute: 0 10*3/uL (ref 0.0–0.1)
EOS ABS: 0 10*3/uL (ref 0.0–0.7)
Eosinophils Relative: 0 %
HEMATOCRIT: 36.2 % (ref 36.0–46.0)
HEMOGLOBIN: 12.3 g/dL (ref 12.0–15.0)
LYMPHS ABS: 2.4 10*3/uL (ref 0.7–4.0)
Lymphocytes Relative: 19 %
MCH: 33 pg (ref 26.0–34.0)
MCHC: 34 g/dL (ref 30.0–36.0)
MCV: 97.1 fL (ref 78.0–100.0)
MONO ABS: 1.4 10*3/uL — AB (ref 0.1–1.0)
MONOS PCT: 11 %
Neutro Abs: 8.7 10*3/uL — ABNORMAL HIGH (ref 1.7–7.7)
Neutrophils Relative %: 70 %
Platelets: 328 10*3/uL (ref 150–400)
RBC: 3.73 MIL/uL — ABNORMAL LOW (ref 3.87–5.11)
RDW: 14.8 % (ref 11.5–15.5)
WBC: 12.5 10*3/uL — ABNORMAL HIGH (ref 4.0–10.5)

## 2018-06-05 LAB — COMPREHENSIVE METABOLIC PANEL
ALBUMIN: 2.3 g/dL — AB (ref 3.5–5.0)
ALK PHOS: 181 U/L — AB (ref 38–126)
ALT: 55 U/L — ABNORMAL HIGH (ref 0–44)
ANION GAP: 10 (ref 5–15)
AST: 123 U/L — ABNORMAL HIGH (ref 15–41)
BILIRUBIN TOTAL: 1.9 mg/dL — AB (ref 0.3–1.2)
BUN: 11 mg/dL (ref 8–23)
CALCIUM: 8.7 mg/dL — AB (ref 8.9–10.3)
CO2: 24 mmol/L (ref 22–32)
Chloride: 101 mmol/L (ref 98–111)
Creatinine, Ser: 0.91 mg/dL (ref 0.44–1.00)
GFR calc non Af Amer: 58 mL/min — ABNORMAL LOW (ref >60)
GLUCOSE: 90 mg/dL (ref 70–99)
POTASSIUM: 4.4 mmol/L (ref 3.5–5.1)
SODIUM: 135 mmol/L (ref 135–145)
TOTAL PROTEIN: 7.5 g/dL (ref 6.5–8.1)

## 2018-06-05 LAB — APTT: APTT: 32 s (ref 24–36)

## 2018-06-05 LAB — CBG MONITORING, ED: GLUCOSE-CAPILLARY: 139 mg/dL — AB (ref 70–99)

## 2018-06-05 LAB — PROTIME-INR
INR: 1.13
Prothrombin Time: 14.5 seconds (ref 11.4–15.2)

## 2018-06-05 LAB — I-STAT TROPONIN, ED: TROPONIN I, POC: 0.42 ng/mL — AB (ref 0.00–0.08)

## 2018-06-05 LAB — D-DIMER, QUANTITATIVE: D-Dimer, Quant: 2.06 ug/mL-FEU — ABNORMAL HIGH (ref 0.00–0.50)

## 2018-06-05 MED ORDER — HEPARIN (PORCINE) IN NACL 100-0.45 UNIT/ML-% IJ SOLN
1300.0000 [IU]/h | INTRAMUSCULAR | Status: DC
  Administered 2018-06-05: 1100 [IU]/h via INTRAVENOUS
  Administered 2018-06-06: 1300 [IU]/h via INTRAVENOUS
  Filled 2018-06-05: qty 250

## 2018-06-05 MED ORDER — POTASSIUM CHLORIDE CRYS ER 20 MEQ PO TBCR
60.0000 meq | EXTENDED_RELEASE_TABLET | Freq: Every day | ORAL | Status: DC
  Administered 2018-06-06: 60 meq via ORAL
  Filled 2018-06-05: qty 3

## 2018-06-05 MED ORDER — MIRTAZAPINE 15 MG PO TABS
7.5000 mg | ORAL_TABLET | Freq: Every day | ORAL | Status: DC
  Administered 2018-06-06 – 2018-06-09 (×5): 7.5 mg via ORAL
  Filled 2018-06-05 (×5): qty 1

## 2018-06-05 MED ORDER — SODIUM CHLORIDE 0.9 % IV SOLN
INTRAVENOUS | Status: DC
  Administered 2018-06-05: 23:00:00 via INTRAVENOUS

## 2018-06-05 MED ORDER — PANTOPRAZOLE SODIUM 40 MG PO TBEC: 40.0000 mg | DELAYED_RELEASE_TABLET | Freq: Every day | ORAL | Status: DC | PRN

## 2018-06-05 MED ORDER — ACETAMINOPHEN 650 MG RE SUPP: 650.0000 mg | Freq: Four times a day (QID) | RECTAL | Status: DC | PRN

## 2018-06-05 MED ORDER — PROCHLORPERAZINE MALEATE 10 MG PO TABS
10.0000 mg | ORAL_TABLET | Freq: Four times a day (QID) | ORAL | Status: DC | PRN
  Filled 2018-06-05: qty 1

## 2018-06-05 MED ORDER — FENTANYL CITRATE (PF) 100 MCG/2ML IJ SOLN
50.0000 ug | Freq: Once | INTRAMUSCULAR | Status: AC
  Administered 2018-06-05: 50 ug via INTRAVENOUS
  Filled 2018-06-05: qty 2

## 2018-06-05 MED ORDER — TRAMADOL HCL 50 MG PO TABS
50.0000 mg | ORAL_TABLET | Freq: Two times a day (BID) | ORAL | Status: DC | PRN
  Administered 2018-06-06: 50 mg via ORAL
  Filled 2018-06-05: qty 1

## 2018-06-05 MED ORDER — LIDOCAINE-PRILOCAINE 2.5-2.5 % EX CREA: 1.0000 "application " | TOPICAL_CREAM | CUTANEOUS | Status: DC | PRN

## 2018-06-05 MED ORDER — DILTIAZEM HCL-DEXTROSE 100-5 MG/100ML-% IV SOLN (PREMIX)
5.0000 mg/h | Freq: Once | INTRAVENOUS | Status: AC
  Administered 2018-06-05: 5 mg/h via INTRAVENOUS
  Filled 2018-06-05: qty 100

## 2018-06-05 MED ORDER — GABAPENTIN 100 MG PO CAPS
200.0000 mg | ORAL_CAPSULE | Freq: Two times a day (BID) | ORAL | Status: DC
  Administered 2018-06-06 – 2018-06-11 (×12): 200 mg via ORAL
  Filled 2018-06-05 (×13): qty 2

## 2018-06-05 MED ORDER — LACTATED RINGERS IV BOLUS
1000.0000 mL | Freq: Once | INTRAVENOUS | Status: AC
  Administered 2018-06-05: 1000 mL via INTRAVENOUS

## 2018-06-05 MED ORDER — ENSURE ENLIVE PO LIQD
237.0000 mL | Freq: Three times a day (TID) | ORAL | Status: DC
  Administered 2018-06-06 – 2018-06-11 (×11): 237 mL via ORAL

## 2018-06-05 MED ORDER — ONDANSETRON HCL 4 MG/2ML IJ SOLN: 4.0000 mg | Freq: Four times a day (QID) | INTRAMUSCULAR | Status: DC | PRN

## 2018-06-05 MED ORDER — ACETAMINOPHEN 325 MG PO TABS: 650.0000 mg | ORAL_TABLET | Freq: Four times a day (QID) | ORAL | Status: DC | PRN

## 2018-06-05 NOTE — ED Notes (Signed)
Attempted to attain blood from foot stick and was unsuccessful. Called IV team again and informed them of situation and that IV that IV team inserted is not allowing RN to draw blood. IV team stated to put in IV team consult and they will be down to ED ASAP.

## 2018-06-05 NOTE — ED Notes (Signed)
Called RT and informed them that patient needs Arterial stick for labs. RT will be in route to ED ASAP.

## 2018-06-05 NOTE — ED Notes (Signed)
ED provider will attempt Ultrasound IV.

## 2018-06-05 NOTE — ED Triage Notes (Signed)
Pt comes from home. Pt is AOx4 and Ambulatory with assistance / device. Pt has been doing chemo since March of this year. Pt stated she was extremely weak all over which is not normal and pt had no issues prior to today. Pt stated she is having a burning sensation in suprapubic area with out any burning, pain or blood when urinating.

## 2018-06-05 NOTE — ED Notes (Signed)
Purewick has been placed.  

## 2018-06-05 NOTE — ED Notes (Signed)
IV Team is at bedside attempting to attain IV access with blood for labs.

## 2018-06-05 NOTE — ED Notes (Signed)
Bed: WA15 Expected date:  Expected time:  Means of arrival:  Comments: EMS-weakness 

## 2018-06-05 NOTE — Progress Notes (Signed)
[place] for Heparin Indication: chest pain/ACS  Hx PE on Xarelto 20mg  daily, last dose 8/25 at 0900  No Known Allergies  Patient Measurements: Height: 5\' 7"  (170.2 cm) Weight: 149 lb 11.1 oz (67.9 kg) IBW/kg (Calculated) : 61.6 Heparin Dosing Weight: 67.9 kg  Vital Signs: Temp: 97.9 F (36.6 C) (08/26 1444) Temp Source: Oral (08/26 1444) BP: 116/72 (08/26 2130) Pulse Rate: 118 (08/26 2130)  Labs: Recent Labs    06/05/18 1933  HGB 12.3  HCT 36.2  PLT 328  LABPROT 14.5  INR 1.13  CREATININE 0.91   Estimated Creatinine Clearance: 47.9 mL/min (by C-G formula based on SCr of 0.91 mg/dL).  Medical History: Past Medical History:  Diagnosis Date  . Adenocarcinoma of head of pancreas ([place]) 11/24/2017  . Arthritis    "all over" (04/26/2017); knees, shoulder  . Essential hypertension 03/03/2017  . Family history of breast cancer   . Family history of kidney cancer   . Family history of pancreatic cancer   . Family history of prostate cancer   . History of blood transfusion 1979   "when I had my hysterectomy"  . Hyperlipidemia   . Hypothyroidism   . Obesity 03/03/2017  . Osteoarthritis 03/03/2017  . Pneumonia    as a child   Medications:  Scheduled:   Infusions:  . sodium chloride     Assessment:  82 yoF to ED with c/o weakness/dizziness.  Hx of Pancreatic Ca s/p Whipple, last chemo C2 d1 Abraxane/Gemzaar PMH: HTN, hypoThyroid  Today, 06/05/2018  Baseline CBC, Protime, INR wnl Troponin 0.42 Anticipate aPTT will be elevated with Xarelto PTA  Goal of Therapy:  Heparin level 0.3-0.7 units/ml  Target aPTT 66 to 102 sec Monitor platelets by anticoagulation protocol: Yes   Plan:   APTT now, can use blood in lab  Heparin infusion at 1100 units/hr, no bolus  Daily CBC while on Heparin  Adjust Heparin rate based on aPTT (if baseline Heparin level > 0.1 - and does not correlate with aPTT) until aPTT and Heparin levels  correlate.    Minda Ditto PharmD Pager [phone] 06/05/2018, 10:28 PM

## 2018-06-05 NOTE — H&P (Addendum)
TR[facility] H&P   Patient Demographics:    Lauren Richard, is a 82 y.o. female  MRN: [id number]   DOB - 1936/08/23  Admit Date - 06/05/2018  Outpatient Primary MD for the patient is Glendale Chard, MD  Referring MD/NP/PA:   Quintella Reichert  Outpatient Specialists:     Patient coming from: home  Chief Complaint  Patient presents with  . Weakness      HPI:    Lauren Richard  is a 82 y.o. female, w h/o hypothyroidism, hypertension, pancreatic cancer s/p whipple apparently had c/o generalized weakness, dizziness, today as well as dyspnea.  Pt denies fever, chills, cough, palp, n/v, diarrhea, brbpr, black stool.   In Ed,  CXR IMPRESSION: 1. No acute cardiopulmonary process. 2. The tip of the left IJ approach single-lumen power injectableport catheter has retracted slightly and now lies within the left innominate vein.  Na 135, K 4.4, Bun 11, Creatinine 0.91 Ast 123, Alt 55, Alk phos 181, T. Bili 1.9 Wbc 12.5, Hgb 12.3, Plt 328  CT brain negative  Trop 0.42  EKG ST at 129, RAD,  St elevation in 2,3, avf, and significant T inversion in v4-6  Repeat EKG, improved ST elevation  Cardiology consulted regarding her EKG changes.   Pt will be admitted for dizziness as well as generalized weakness and dyspnea likely related to tachycardia and ACS.     Review of systems:    In addition to the HPI above,  No Fever-chills, No Headache, No changes with Vision or hearing, No problems swallowing food or Liquids, No Chest pain, Cough   No Abdominal pain, No Nausea or Vommitting, Bowel movements are regular, No Blood in stool or Urine, No dysuria, No new skin rashes or bruises, No new joints pains-aches,  No new weakness, tingling, numbness in any extremity, No recent weight gain or loss, No polyuria, polydypsia or polyphagia, No significant Mental Stressors.  A full 10 point  Review of Systems was done, except as stated above, all other Review of Systems were negative.   With Past History of the following :    Past Medical History:  Diagnosis Date  . Adenocarcinoma of head of pancreas ([place]) 11/24/2017  . Arthritis    "all over" (04/26/2017); knees, shoulder  . Essential hypertension 03/03/2017  . Family history of breast cancer   . Family history of kidney cancer   . Family history of pancreatic cancer   . Family history of prostate cancer   . History of blood transfusion 1979   "when I had my hysterectomy"  . Hyperlipidemia   . Hypothyroidism   . Obesity 03/03/2017  . Osteoarthritis 03/03/2017  . Pneumonia    as a child      Past Surgical History:  Procedure Laterality Date  . BREAST CYST EXCISION Right    "cut 6 cysts out"  . CYST EXCISION Left    "  off my toe"  . DILATION AND CURETTAGE OF UTERUS     "before hysterectomy"  . ERCP N/A 11/04/2017   Procedure: ENDOSCOPIC RETROGRADE CHOLANGIOPANCREATOGRAPHY (ERCP);  Surgeon: Carol Ada, MD;  Location: [facility];  Service: Endoscopy;  Laterality: N/A;  . ESOPHAGOGASTRODUODENOSCOPY N/A 04/24/2018   Procedure: ESOPHAGOGASTRODUODENOSCOPY (EGD);  Surgeon: Jerene Bears, MD;  Location: [facility];  Service: Gastroenterology;  Laterality: N/A;  . EUS N/A 11/11/2017   Procedure: UPPER ENDOSCOPIC ULTRASOUND (EUS) LINEAR;  Surgeon: Carol Ada, MD;  Location: [facility];  Service: Endoscopy;  Laterality: N/A;  . FINE NEEDLE ASPIRATION N/A 11/11/2017   Procedure: FINE NEEDLE ASPIRATION (FNA) LINEAR;  Surgeon: Carol Ada, MD;  Location: [facility];  Service: Endoscopy;  Laterality: N/A;  . INGUINAL HERNIA REPAIR Right   . LAPAROSCOPY N/A 01/05/2018   Procedure: LAPAROSCOPY DIAGNOSTIC ERAS PATHWAY;  Surgeon: Stark Klein, MD;  Location: [facility];  Service: General;  Laterality: N/A;  [place]  . PORTACATH PLACEMENT Left 04/11/2018   Procedure: INSERTION PORT-A-CATH;  Surgeon: Stark Klein, MD;  Location:  [facility];  Service: General;  Laterality: Left;  . REDUCTION MAMMAPLASTY Bilateral 1980  . TONSILLECTOMY     "when I small"  . TOTAL ABDOMINAL HYSTERECTOMY  1979  . TOTAL KNEE ARTHROPLASTY Left 04/26/2017   Procedure: LEFT TOTAL KNEE ARTHROPLASTY;  Surgeon: [name], MD;  Location: [facility];  Service: Orthopedics;  Laterality: Left;  . WHIPPLE PROCEDURE N/A 01/05/2018   Procedure: WHIPPLE PROCEDURE;  Surgeon: Stark Klein, MD;  Location: [place];  Service: General;  Laterality: N/A;      Social History:     Social History   Tobacco Use  . Smoking status: Never Smoker  . Smokeless tobacco: Never Used  Substance Use Topics  . Alcohol use: No    Alcohol/week: 0.0 standard drinks    Frequency: Never    Comment: occasional glass of wine      Lives - at home  Mobility - walks by self   Family History :     Family History  Problem Relation Age of Onset  . Kidney cancer Mother 94  . Lung cancer Father 55       lung, smoker  . Congestive Heart Failure Sister   . Asthma Sister   . Atrial fibrillation Sister   . Hypertension Sister   . Diabetes Sister   . Head & neck cancer Paternal Grandmother   . Cancer Maternal Uncle        type unk  . Pancreatic cancer Paternal Aunt        dx >50  . Cancer Paternal Uncle        type unk  . Breast cancer Cousin 31  . Prostate cancer Other 11       had robotic surgery       Home Medications:   Prior to Admission medications   Medication Sig Start Date End Date Taking? Authorizing Provider  capecitabine (XELODA) 500 MG tablet Take 2 tablets (1025m) by mouth 2 times daily, within 30 min of finishing food. Take on days 1-7 and 15-21 of each 28 day cycle 05/01/18  Yes FTruitt Merle MD  feeding supplement, ENSURE ENLIVE, (ENSURE ENLIVE) LIQD Take 237 mLs by mouth 3 (three) times daily between meals. 04/25/18  Yes NMariel Aloe MD  gabapentin (NEURONTIN) 100 MG capsule Take 2 capsules (200 mg total) by mouth 2 (two) times daily. 01/13/18   Yes BStark Klein MD  lidocaine-prilocaine (EMLA) cream Apply 1  application topically as needed. 04/14/18  Yes Truitt Merle, MD  mirtazapine (REMERON) 7.5 MG tablet Take 1 tablet (7.5 mg total) by mouth at bedtime. 05/23/18  Yes Truitt Merle, MD  ondansetron (ZOFRAN) 8 MG tablet Take 8 mg by mouth 2 (two) times daily as needed for nausea/vomiting. 03/23/18  Yes [provider]  pantoprazole (PROTONIX) 40 MG tablet Take 1 tablet (40 mg total) by mouth daily at 12 noon. Patient taking differently: Take 40 mg by mouth daily as needed (heartburn).  01/13/18  Yes Stark Klein, MD  potassium chloride SA (K-DUR,KLOR-CON) 20 MEQ tablet Take 2 tablets (40 mEq total) by mouth daily. Patient taking differently: Take 60 mEq by mouth daily.  04/20/18  Yes Truitt Merle, MD  PRESCRIPTION MEDICATION Apply 1 application topically 4 (four) times daily as needed (for back pain). Dic 3%, Bac 2%, lid - pril 2-5%, Menth 1% cream    Yes [provider]  prochlorperazine (COMPAZINE) 10 MG tablet Take 10 mg by mouth every 6 (six) hours as needed for nausea or vomiting.   Yes [provider]  rivaroxaban (XARELTO) 20 MG TABS tablet Take 1 tablet (20 mg total) by mouth daily with supper. 04/04/18  Yes Sheikh, Omair Latif, DO  traMADol (ULTRAM) 50 MG tablet Take 50 mg by mouth every 12 (twelve) hours as needed (for pain.).   Yes [provider]  trolamine salicylate (ASPERCREME) 10 % cream Apply 1 application topically 4 (four) times daily as needed (for knee pain/muscle pain.).    Yes [provider]  nystatin (MYCOSTATIN) 100000 UNIT/ML suspension Take 5 mLs (500,000 Units total) by mouth 4 (four) times daily. Patient not taking: Reported on 06/05/2018 03/24/18   Alla Feeling, NP     Allergies:    No Known Allergies   Physical Exam:   Vitals  Blood pressure 114/72, pulse (!) 106, temperature 97.9 F (36.6 C), temperature source Oral, resp. rate (!) 22, height 5' 7"  (1.702 m), weight  67.9 kg, SpO2 98 %.   1. General lying in bed in NAD,    2. Normal affect and insight, Not Suicidal or Homicidal, Awake Alert, Oriented X 3.  3. No F.N deficits, ALL C.Nerves Intact, Strength 5/5 all 4 extremities, Sensation intact all 4 extremities, Plantars down going.  4. Ears and Eyes appear Normal, Conjunctivae clear, PERRLA. Moist Oral Mucosa.  5. Supple Neck, No JVD, No cervical lymphadenopathy appriciated, No Carotid Bruits.  6. Symmetrical Chest wall movement, Good air movement bilaterally, CTAB.  7.tachy s1, s2,   portacath in left upper chest  8. Positive Bowel Sounds, Abdomen Soft, No tenderness, No organomegaly appriciated,No rebound -guarding or rigidity.  9.  No Cyanosis, Normal Skin Turgor, No Skin Rash or Bruise.  10. Good muscle tone,  joints appear normal , no effusions, Normal ROM.  11. No Palpable Lymph Nodes in Neck or Axillae    Data Review:    CBC Recent Labs  Lab 05/30/18 0851 06/05/18 1933  WBC 6.0 12.5*  HGB 12.4 12.3  HCT 37.9 36.2  PLT 419* 328  MCV 102.5* 97.1  MCH 33.4 33.0  MCHC 32.6 34.0  RDW 16.5* 14.8  LYMPHSABS 1.6 2.4  MONOABS 0.4 1.4*  EOSABS 0.0 0.0  BASOSABS 0.0 0.0   ------------------------------------------------------------------------------------------------------------------  Chemistries  Recent Labs  Lab 05/30/18 0851 06/05/18 1933  NA 136 135  K 4.3 4.4  CL 105 101  CO2 19* 24  GLUCOSE 97 90  BUN 6* 11  CREATININE 0.82  0.91  CALCIUM 9.2 8.7*  AST 106* 123*  ALT 48* 55*  ALKPHOS 246* 181*  BILITOT 1.1 1.9*   ------------------------------------------------------------------------------------------------------------------ estimated creatinine clearance is 47.9 mL/min (by C-G formula based on SCr of 0.91 mg/dL). ------------------------------------------------------------------------------------------------------------------ No results for input(s): TSH, T4TOTAL, T3FREE, THYROIDAB in the last 72  hours.  Invalid input(s): FREET3  Coagulation profile Recent Labs  Lab 06/05/18 1933  INR 1.13   ------------------------------------------------------------------------------------------------------------------- No results for input(s): DDIMER in the last 72 hours. -------------------------------------------------------------------------------------------------------------------  Cardiac Enzymes No results for input(s): CKMB, TROPONINI, MYOGLOBIN in the last 168 hours.  Invalid input(s): CK ------------------------------------------------------------------------------------------------------------------ No results found for: BNP   ---------------------------------------------------------------------------------------------------------------  Urinalysis    Component Value Date/Time   COLORURINE YELLOW 04/22/2018 1415   APPEARANCEUR CLEAR 04/22/2018 1415   LABSPEC 1.005 04/22/2018 1415   PHURINE 8.0 04/22/2018 1415   GLUCOSEU NEGATIVE 04/22/2018 1415   HGBUR NEGATIVE 04/22/2018 1415   BILIRUBINUR NEGATIVE 04/22/2018 1415   KETONESUR NEGATIVE 04/22/2018 1415   PROTEINUR NEGATIVE 04/22/2018 1415   NITRITE NEGATIVE 04/22/2018 1415   LEUKOCYTESUR NEGATIVE 04/22/2018 1415    ----------------------------------------------------------------------------------------------------------------   Imaging Results:    Dg Chest 2 View  Result Date: 06/05/2018 CLINICAL DATA:  82 year old female with generalized weakness EXAM: CHEST - 2 VIEW COMPARISON:  Prior chest x-ray 04/11/2018 FINDINGS: Stable cardiac and mediastinal contours. A left subclavian approach single-lumen power injectable portacatheter is present. The tip of the catheter has retracted some and now overlies the left innominate vein. No evidence of focal airspace consolidation, infiltrate, pleural effusion or pneumothorax. Inspiratory volumes are slightly low. Chronic bronchitic changes appear similar compared to prior. No  acute osseous abnormality. IMPRESSION: 1. No acute cardiopulmonary process. 2. The tip of the left IJ approach single-lumen power injectable port catheter has retracted slightly and now lies within the left innominate vein. Electronically Signed   By: Jacqulynn Cadet M.D.   On: 06/05/2018 15:32       Assessment & Plan:    Principal Problem:   Tachycardia Active Problems:   Elevated troponin   Dyspnea   Abnormal liver function    Tachycardia ACS, troponin elevation Check lipid Check d dimer , if positive then CTA chest r/o PE Check trop I q6h x3 Check cardiac echo STOP Xarelto Heparin GTT Start Aspirin 328m po qday Start Lipitor 494mpo qhs x1 dose Start Metoprolol 2517mo bid Cardiology consulted , spoke with them directly RN [facility] Probably secondary to tachycardia Check cardiac echo   Abnormal LFT Check acute hepatitis panel Check cmp in AM  H/o pancreatic cancer Cont Xeloda Cont Gabapentin  Gerd Cont PPI  Nausea Zofran 4mg66m q6h prn      DVT Prophylaxis Heparin - SCDs  AM Labs Ordered, also please review Full Orders  Family Communication: Admission, patients condition and plan of care including tests being ordered have been discussed with the patient  who indicate understanding and agree with the plan and Code Status.  Code Status  FULL CODE  Likely DC to  home  Condition GUARDED   Consults called: cardiology   Admission status: observation, pt will require a hospital stay due to ACS, needs iv heparin, and trending of troponins.  Her stay maybe complicated by complications of her ACS and may need to transition to inpatient stay if this occurs.   Time spent in minutes : 70   JameJani Gravel on 06/05/2018 at 9:34 PM  Between 7am to 7pm -  Pager - [phone]  . After 7pm go to [email or web address] - password [facility]  [facility] - Office  [phone]

## 2018-06-05 NOTE — ED Provider Notes (Signed)
[facility] DEPT Provider Note   CSN: [id number] Arrival date & time: 06/05/18  1426     History   Chief Complaint Chief Complaint  Patient presents with  . Weakness    HPI BRITTYN SALAZ is a 82 y.o. female.  The history is provided by the patient and a relative. No language interpreter was used.  Weakness    RICKETTA COLANTONIO is a 82 y.o. female who presents to the Emergency Department complaining of weakness.  She presents to the emergency department, accompanied by her nephew for evaluation of weakness and dizziness. She has been experiencing intermittent episodes of weakness and dizziness for an unclear period of time. Today when she was getting ready for a routine office appointment that she had a severe episode of dizziness and weakness. She was unable to ambulate and felt shortness of breath with attempt at ambulation. Her nephew called 41. She denies any fevers, chest pain, cough, leg swelling or pain. She does report that she had burning in her lower belly earlier today that is waxing and waning, now resolved. Past Medical History:  Diagnosis Date  . Adenocarcinoma of head of pancreas ([place]) 11/24/2017  . Arthritis    "all over" (04/26/2017); knees, shoulder  . Essential hypertension 03/03/2017  . Family history of breast cancer   . Family history of kidney cancer   . Family history of pancreatic cancer   . Family history of prostate cancer   . History of blood transfusion 1979   "when I had my hysterectomy"  . Hyperlipidemia   . Hypothyroidism   . Obesity 03/03/2017  . Osteoarthritis 03/03/2017  . Pneumonia    as a child    Patient Active Problem List   Diagnosis Date Noted  . Tachycardia 06/05/2018  . Elevated troponin 06/05/2018  . Dyspnea 06/05/2018  . Abnormal liver function 06/05/2018  . Genetic testing 05/05/2018  . Malnutrition of moderate degree 04/25/2018  . Malignant neoplasm of head of pancreas ([place])   . Heme positive  stool   . Syncope 04/23/2018  . Syncope, near 04/22/2018  . Pancreatic cancer metastasized to liver ([place]) 04/22/2018  . Generalized weakness 03/29/2018  . Nausea & vomiting 03/29/2018  . Diarrhea 03/29/2018  . Chemotherapy induced neutropenia ([place]) 03/23/2018  . Family history of pancreatic cancer   . Family history of breast cancer   . Family history of kidney cancer   . Family history of prostate cancer   . Goals of care, counseling/discussion 03/09/2018  . Acute pulmonary embolism ([place]) 03/09/2018  . S/P laparoscopy 01/05/2018  . Adenocarcinoma of head of pancreas ([place]) 11/24/2017  . Acute kidney injury superimposed on chronic kidney disease ([place])   . Hypokalemia   . Anemia   . Biliary obstruction   . Acute kidney injury ([place]) 11/03/2017  . Pancreatic mass 11/03/2017  . Osteoarthritis of left knee 04/26/2017  . S/P total knee replacement using cement, left 04/26/2017  . Essential hypertension 03/03/2017  . Hyperlipidemia 03/03/2017  . Osteoarthritis 03/03/2017  . Obesity 03/03/2017  . Status post left foot surgery 12/03/2015  . Hammertoe 11/03/2015    Past Surgical History:  Procedure Laterality Date  . BREAST CYST EXCISION Right    "cut 6 cysts out"  . CYST EXCISION Left    "off my toe"  . DILATION AND CURETTAGE OF UTERUS     "before hysterectomy"  . ERCP N/A 11/04/2017   Procedure: ENDOSCOPIC RETROGRADE CHOLANGIOPANCREATOGRAPHY (ERCP);  Surgeon: Carol Ada, MD;  Location:  [facility];  Service: Endoscopy;  Laterality: N/A;  . ESOPHAGOGASTRODUODENOSCOPY N/A 04/24/2018   Procedure: ESOPHAGOGASTRODUODENOSCOPY (EGD);  Surgeon: Jerene Bears, MD;  Location: [facility];  Service: Gastroenterology;  Laterality: N/A;  . EUS N/A 11/11/2017   Procedure: UPPER ENDOSCOPIC ULTRASOUND (EUS) LINEAR;  Surgeon: Carol Ada, MD;  Location: [facility];  Service: Endoscopy;  Laterality: N/A;  . FINE NEEDLE ASPIRATION N/A 11/11/2017   Procedure: FINE NEEDLE ASPIRATION (FNA) LINEAR;   Surgeon: Carol Ada, MD;  Location: [facility];  Service: Endoscopy;  Laterality: N/A;  . INGUINAL HERNIA REPAIR Right   . LAPAROSCOPY N/A 01/05/2018   Procedure: LAPAROSCOPY DIAGNOSTIC ERAS PATHWAY;  Surgeon: Stark Klein, MD;  Location: [facility];  Service: General;  Laterality: N/A;  [place]  . PORTACATH PLACEMENT Left 04/11/2018   Procedure: INSERTION PORT-A-CATH;  Surgeon: Stark Klein, MD;  Location: [facility];  Service: General;  Laterality: Left;  . REDUCTION MAMMAPLASTY Bilateral 1980  . TONSILLECTOMY     "when I small"  . TOTAL ABDOMINAL HYSTERECTOMY  1979  . TOTAL KNEE ARTHROPLASTY Left 04/26/2017   Procedure: LEFT TOTAL KNEE ARTHROPLASTY;  Surgeon: [name], MD;  Location: [facility];  Service: Orthopedics;  Laterality: Left;  . WHIPPLE PROCEDURE N/A 01/05/2018   Procedure: WHIPPLE PROCEDURE;  Surgeon: Stark Klein, MD;  Location: [facility];  Service: General;  Laterality: N/A;     OB History   None      Home Medications    Prior to Admission medications   Medication Sig Start Date End Date Taking? Authorizing Provider  capecitabine (XELODA) 500 MG tablet Take 2 tablets (1000mg ) by mouth 2 times daily, within 30 min of finishing food. Take on days 1-7 and 15-21 of each 28 day cycle 05/01/18  Yes Truitt Merle, MD  feeding supplement, ENSURE ENLIVE, (ENSURE ENLIVE) LIQD Take 237 mLs by mouth 3 (three) times daily between meals. 04/25/18  Yes Mariel Aloe, MD  gabapentin (NEURONTIN) 100 MG capsule Take 2 capsules (200 mg total) by mouth 2 (two) times daily. 01/13/18  Yes Stark Klein, MD  lidocaine-prilocaine (EMLA) cream Apply 1 application topically as needed. 04/14/18  Yes Truitt Merle, MD  mirtazapine (REMERON) 7.5 MG tablet Take 1 tablet (7.5 mg total) by mouth at bedtime. 05/23/18  Yes Truitt Merle, MD  ondansetron (ZOFRAN) 8 MG tablet Take 8 mg by mouth 2 (two) times daily as needed for nausea/vomiting. 03/23/18  Yes [provider]  pantoprazole (PROTONIX) 40 MG tablet Take  1 tablet (40 mg total) by mouth daily at 12 noon. Patient taking differently: Take 40 mg by mouth daily as needed (heartburn).  01/13/18  Yes Stark Klein, MD  potassium chloride SA (K-DUR,KLOR-CON) 20 MEQ tablet Take 2 tablets (40 mEq total) by mouth daily. Patient taking differently: Take 60 mEq by mouth daily.  04/20/18  Yes Truitt Merle, MD  PRESCRIPTION MEDICATION Apply 1 application topically 4 (four) times daily as needed (for back pain). Dic 3%, Bac 2%, lid - pril 2-5%, Menth 1% cream    Yes [provider]  prochlorperazine (COMPAZINE) 10 MG tablet Take 10 mg by mouth every 6 (six) hours as needed for nausea or vomiting.   Yes [provider]  rivaroxaban (XARELTO) 20 MG TABS tablet Take 1 tablet (20 mg total) by mouth daily with supper. 04/04/18  Yes Sheikh, Omair Latif, DO  traMADol (ULTRAM) 50 MG tablet Take 50 mg by mouth every 12 (twelve) hours as needed (for pain.).   Yes [provider]  trolamine salicylate (ASPERCREME) 10 % cream Apply 1 application topically 4 (four) times daily as needed (for knee pain/muscle pain.).    Yes [provider]  nystatin (MYCOSTATIN) 100000 UNIT/ML suspension Take 5 mLs (500,000 Units total) by mouth 4 (four) times daily. Patient not taking: Reported on 06/05/2018 03/24/18   Alla Feeling, NP    Family History Family History  Problem Relation Age of Onset  . Kidney cancer Mother 63  . Lung cancer Father 23       lung, smoker  . Congestive Heart Failure Sister   . Asthma Sister   . Atrial fibrillation Sister   . Hypertension Sister   . Diabetes Sister   . Head & neck cancer Paternal Grandmother   . Cancer Maternal Uncle        type unk  . Pancreatic cancer Paternal Aunt        dx >50  . Cancer Paternal Uncle        type unk  . Breast cancer Cousin 52  . Prostate cancer Other 62       had robotic surgery    Social History Social History   Tobacco Use  . Smoking status: Never Smoker  . Smokeless  tobacco: Never Used  Substance Use Topics  . Alcohol use: No    Alcohol/week: 0.0 standard drinks    Frequency: Never    Comment: occasional glass of wine   . Drug use: No     Allergies   Patient has no known allergies.   Review of Systems Review of Systems  Neurological: Positive for weakness.  All other systems reviewed and are negative.    Physical Exam Updated Vital Signs BP 108/73 (BP Location: Left Arm)   Pulse (!) 113   Temp 97.9 F (36.6 C) (Oral)   Resp (!) 24   Ht 5\' 7"  (1.702 m)   Wt 67.9 kg   SpO2 96%   BMI 23.45 kg/m   Physical Exam  Constitutional: She appears well-developed and well-nourished.  HENT:  Head: Normocephalic and atraumatic.  Cardiovascular:  No murmur heard. Tachycardic, irregular  Pulmonary/Chest: Effort normal and breath sounds normal. No respiratory distress.  Abdominal: Soft. There is no tenderness. There is no rebound and no guarding.  Musculoskeletal: She exhibits no edema or tenderness.  Neurological: She is alert.  Mildly confused, generalized weakness.  Oriented to person, place, disoriented to time and recent events.    Skin: Skin is warm and dry.  Psychiatric: She has a normal mood and affect. Her behavior is normal.  Nursing note and vitals reviewed.    ED Treatments / Results  Labs (all labs ordered are listed, but only abnormal results are displayed) Labs Reviewed  CBC WITH DIFFERENTIAL/PLATELET - Abnormal; Notable for the following components:      Result Value   WBC 12.5 (*)    RBC 3.73 (*)    Neutro Abs 8.7 (*)    Monocytes Absolute 1.4 (*)    All other components within normal limits  COMPREHENSIVE METABOLIC PANEL - Abnormal; Notable for the following components:   Calcium 8.7 (*)    Albumin 2.3 (*)    AST 123 (*)    ALT 55 (*)    Alkaline Phosphatase 181 (*)    Total Bilirubin 1.9 (*)    GFR calc non Af Amer 58 (*)    All other components within normal limits  D-DIMER, QUANTITATIVE (NOT AT [facility]) -  Abnormal; Notable for the  following components:   D-Dimer, Quant 2.06 (*)    All other components within normal limits  CBG MONITORING, ED - Abnormal; Notable for the following components:   Glucose-Capillary 139 (*)    All other components within normal limits  I-STAT TROPONIN, ED - Abnormal; Notable for the following components:   Troponin i, poc 0.42 (*)    All other components within normal limits  PROTIME-INR  APTT  CBC WITH DIFFERENTIAL/PLATELET  URINALYSIS, ROUTINE W REFLEX MICROSCOPIC  TROPONIN I  TROPONIN I  TROPONIN I  CK TOTAL AND CKMB (NOT AT [facility])  COMPREHENSIVE METABOLIC PANEL  CBC  LIPID PANEL  APTT  HEPARIN LEVEL (UNFRACTIONATED)    EKG EKG Interpretation  Date/Time:  Monday June 05 2018 16:22:24 EDT Ventricular Rate:  106 PR Interval:    QRS Duration: 92 QT Interval:  387 QTC Calculation: 514 R Axis:   -42 Text Interpretation:  Sinus tachycardia Ventricular bigeminy Inferior infarct, old Lateral leads are also involved Prolonged QT interval Confirmed by Quintella Reichert [phone]) on 06/05/2018 5:35:42 PM   Radiology Dg Chest 2 View  Result Date: 06/05/2018 CLINICAL DATA:  82 year old female with generalized weakness EXAM: CHEST - 2 VIEW COMPARISON:  Prior chest x-ray 04/11/2018 FINDINGS: Stable cardiac and mediastinal contours. A left subclavian approach single-lumen power injectable portacatheter is present. The tip of the catheter has retracted some and now overlies the left innominate vein. No evidence of focal airspace consolidation, infiltrate, pleural effusion or pneumothorax. Inspiratory volumes are slightly low. Chronic bronchitic changes appear similar compared to prior. No acute osseous abnormality. IMPRESSION: 1. No acute cardiopulmonary process. 2. The tip of the left IJ approach single-lumen power injectable port catheter has retracted slightly and now lies within the left innominate vein. Electronically Signed   By: Jacqulynn Cadet M.D.   On:  06/05/2018 15:32   Ct Head Wo Contrast  Result Date: 06/05/2018 CLINICAL DATA:  Muscle weakness EXAM: CT HEAD WITHOUT CONTRAST TECHNIQUE: Contiguous axial images were obtained from the base of the skull through the vertex without intravenous contrast. COMPARISON:  Head CT 04/22/2018 FINDINGS: Brain: There is no mass, hemorrhage or extra-axial collection. There is mild generalized atrophy without lobar predilection. There is no acute or chronic infarction. The brain parenchyma is normal. Vascular: No abnormal hyperdensity of the major intracranial arteries or dural venous sinuses. No intracranial atherosclerosis. Skull: The visualized skull base, calvarium and extracranial soft tissues are normal. Sinuses/Orbits: No fluid levels or advanced mucosal thickening of the visualized paranasal sinuses. No mastoid or middle ear effusion. The orbits are normal. IMPRESSION: Mild parenchymal volume loss without acute intracranial abnormality. Electronically Signed   By: Ulyses Jarred M.D.   On: 06/05/2018 21:55    Procedures Procedures (including critical care time) CRITICAL CARE Performed by: Quintella Reichert   Total critical care time: 35 minutes  Critical care time was exclusive of separately billable procedures and treating other patients.  Critical care was necessary to treat or prevent imminent or life-threatening deterioration.  Critical care was time spent personally by me on the following activities: development of treatment plan with patient and/or surrogate as well as nursing, discussions with consultants, evaluation of patient's response to treatment, examination of patient, obtaining history from patient or surrogate, ordering and performing treatments and interventions, ordering and review of laboratory studies, ordering and review of radiographic studies, pulse oximetry and re-evaluation of patient's condition.  Medications Ordered in ED Medications  0.9 %  sodium chloride infusion (  Intravenous New Bag/Given 06/05/18 2234)  acetaminophen (TYLENOL) tablet 650 mg (has no administration in time range)    Or  acetaminophen (TYLENOL) suppository 650 mg (has no administration in time range)  heparin ADULT infusion 100 units/mL (25000 units/2101mL sodium chloride 0.45%) (1,100 Units/hr Intravenous New Bag/Given 06/05/18 2239)  traMADol (ULTRAM) tablet 50 mg (has no administration in time range)  mirtazapine (REMERON) tablet 7.5 mg (has no administration in time range)  prochlorperazine (COMPAZINE) tablet 10 mg (has no administration in time range)  pantoprazole (PROTONIX) EC tablet 40 mg (has no administration in time range)  gabapentin (NEURONTIN) capsule 200 mg (has no administration in time range)  feeding supplement (ENSURE ENLIVE) (ENSURE ENLIVE) liquid 237 mL (has no administration in time range)  potassium chloride SA (K-DUR,KLOR-CON) CR tablet 60 mEq (has no administration in time range)  lidocaine-prilocaine (EMLA) cream 1 application (has no administration in time range)  ondansetron (ZOFRAN) injection 4 mg (has no administration in time range)  lactated ringers bolus 1,000 mL (0 mLs Intravenous Stopped 06/05/18 1636)  diltiazem (CARDIZEM) 100 mg in dextrose 5% 180mL (1 mg/mL) infusion (0 mg/hr Intravenous Stopped 06/05/18 2328)  fentaNYL (SUBLIMAZE) injection 50 mcg (50 mcg Intravenous Given 06/05/18 2112)     Initial Impression / Assessment and Plan / ED Course  I have reviewed the triage vital signs and the nursing notes.  Pertinent labs & imaging results that were available during my care of the patient were reviewed by me and considered in my medical decision making (see chart for details).     Patient with history of advanced cancer, PE on it is Xarelto therapy here for evaluation of dizziness, shortness of breath. She is noted to be in new onset a fib with RVR. EKG does have ischemic changes compared to priors. Patient states that she was told she had atrial  fibrillation in the past and then she was told that she did not have it. She has no current chest pain or shortness of breath in the department. Troponin is elevated at 0.4. She was treated with diltiazem for rate control. She does have a porta cath in place, but does not draw back. Multiple attempts were made to obtain blood draws but most attempts were hemolyzed, resulting in delay in obtaining and labs. Multiple IVs were placed in attempt at drawing labs.  Medicine consulted for admission for further evaluation.      Final Clinical Impressions(s) / ED Diagnoses   Final diagnoses:  None    ED Discharge Orders    None       Quintella Reichert, MD 06/05/18 2340

## 2018-06-05 NOTE — ED Notes (Signed)
IV team at bedside 

## 2018-06-05 NOTE — ED Notes (Signed)
Patient transported to X-ray 

## 2018-06-06 ENCOUNTER — Observation Stay (HOSPITAL_COMMUNITY): Payer: PPO

## 2018-06-06 ENCOUNTER — Ambulatory Visit (HOSPITAL_BASED_OUTPATIENT_CLINIC_OR_DEPARTMENT_OTHER): Payer: PPO

## 2018-06-06 DIAGNOSIS — Z90710 Acquired absence of both cervix and uterus: Secondary | ICD-10-CM | POA: Diagnosis not present

## 2018-06-06 DIAGNOSIS — R000 Tachycardia, unspecified: Secondary | ICD-10-CM | POA: Diagnosis not present

## 2018-06-06 DIAGNOSIS — E871 Hypo-osmolality and hyponatremia: Secondary | ICD-10-CM | POA: Diagnosis present

## 2018-06-06 DIAGNOSIS — Z90411 Acquired partial absence of pancreas: Secondary | ICD-10-CM | POA: Diagnosis not present

## 2018-06-06 DIAGNOSIS — E785 Hyperlipidemia, unspecified: Secondary | ICD-10-CM | POA: Diagnosis present

## 2018-06-06 DIAGNOSIS — Z96652 Presence of left artificial knee joint: Secondary | ICD-10-CM | POA: Diagnosis present

## 2018-06-06 DIAGNOSIS — R740 Nonspecific elevation of levels of transaminase and lactic acid dehydrogenase [LDH]: Secondary | ICD-10-CM | POA: Diagnosis not present

## 2018-06-06 DIAGNOSIS — E876 Hypokalemia: Secondary | ICD-10-CM | POA: Diagnosis present

## 2018-06-06 DIAGNOSIS — I428 Other cardiomyopathies: Secondary | ICD-10-CM | POA: Diagnosis present

## 2018-06-06 DIAGNOSIS — C787 Secondary malignant neoplasm of liver and intrahepatic bile duct: Secondary | ICD-10-CM | POA: Diagnosis present

## 2018-06-06 DIAGNOSIS — R627 Adult failure to thrive: Secondary | ICD-10-CM | POA: Diagnosis present

## 2018-06-06 DIAGNOSIS — I4891 Unspecified atrial fibrillation: Secondary | ICD-10-CM

## 2018-06-06 DIAGNOSIS — I480 Paroxysmal atrial fibrillation: Secondary | ICD-10-CM | POA: Diagnosis not present

## 2018-06-06 DIAGNOSIS — C799 Secondary malignant neoplasm of unspecified site: Secondary | ICD-10-CM | POA: Diagnosis present

## 2018-06-06 DIAGNOSIS — E440 Moderate protein-calorie malnutrition: Secondary | ICD-10-CM | POA: Diagnosis present

## 2018-06-06 DIAGNOSIS — Z7901 Long term (current) use of anticoagulants: Secondary | ICD-10-CM | POA: Diagnosis not present

## 2018-06-06 DIAGNOSIS — I213 ST elevation (STEMI) myocardial infarction of unspecified site: Secondary | ICD-10-CM | POA: Diagnosis present

## 2018-06-06 DIAGNOSIS — I2510 Atherosclerotic heart disease of native coronary artery without angina pectoris: Secondary | ICD-10-CM | POA: Diagnosis present

## 2018-06-06 DIAGNOSIS — R918 Other nonspecific abnormal finding of lung field: Secondary | ICD-10-CM | POA: Diagnosis not present

## 2018-06-06 DIAGNOSIS — K219 Gastro-esophageal reflux disease without esophagitis: Secondary | ICD-10-CM | POA: Diagnosis present

## 2018-06-06 DIAGNOSIS — I110 Hypertensive heart disease with heart failure: Secondary | ICD-10-CM | POA: Diagnosis present

## 2018-06-06 DIAGNOSIS — I700 Atherosclerosis of aorta: Secondary | ICD-10-CM | POA: Diagnosis not present

## 2018-06-06 DIAGNOSIS — Z86711 Personal history of pulmonary embolism: Secondary | ICD-10-CM | POA: Diagnosis not present

## 2018-06-06 DIAGNOSIS — Z6823 Body mass index (BMI) 23.0-23.9, adult: Secondary | ICD-10-CM | POA: Diagnosis not present

## 2018-06-06 DIAGNOSIS — I200 Unstable angina: Secondary | ICD-10-CM | POA: Diagnosis not present

## 2018-06-06 DIAGNOSIS — Z8507 Personal history of malignant neoplasm of pancreas: Secondary | ICD-10-CM | POA: Diagnosis not present

## 2018-06-06 DIAGNOSIS — I5023 Acute on chronic systolic (congestive) heart failure: Secondary | ICD-10-CM | POA: Diagnosis present

## 2018-06-06 DIAGNOSIS — M549 Dorsalgia, unspecified: Secondary | ICD-10-CM | POA: Diagnosis present

## 2018-06-06 DIAGNOSIS — Z79899 Other long term (current) drug therapy: Secondary | ICD-10-CM | POA: Diagnosis not present

## 2018-06-06 DIAGNOSIS — I255 Ischemic cardiomyopathy: Secondary | ICD-10-CM | POA: Diagnosis not present

## 2018-06-06 DIAGNOSIS — I5181 Takotsubo syndrome: Secondary | ICD-10-CM | POA: Diagnosis not present

## 2018-06-06 LAB — CBC
HEMATOCRIT: 33.2 % — AB (ref 36.0–46.0)
Hemoglobin: 10.5 g/dL — ABNORMAL LOW (ref 12.0–15.0)
MCH: 31.7 pg (ref 26.0–34.0)
MCHC: 31.6 g/dL (ref 30.0–36.0)
MCV: 100.3 fL — AB (ref 78.0–100.0)
Platelets: 233 10*3/uL (ref 150–400)
RBC: 3.31 MIL/uL — AB (ref 3.87–5.11)
RDW: 14.4 % (ref 11.5–15.5)
WBC: 9.1 10*3/uL (ref 4.0–10.5)

## 2018-06-06 LAB — COMPREHENSIVE METABOLIC PANEL
ALK PHOS: 145 U/L — AB (ref 38–126)
ALT: 44 U/L (ref 0–44)
AST: 102 U/L — ABNORMAL HIGH (ref 15–41)
Albumin: 1.7 g/dL — ABNORMAL LOW (ref 3.5–5.0)
Anion gap: 10 (ref 5–15)
BUN: 8 mg/dL (ref 8–23)
CALCIUM: 7.6 mg/dL — AB (ref 8.9–10.3)
CO2: 18 mmol/L — ABNORMAL LOW (ref 22–32)
CREATININE: 0.82 mg/dL (ref 0.44–1.00)
Chloride: 105 mmol/L (ref 98–111)
GFR calc non Af Amer: >60 mL/min (ref >60)
Glucose, Bld: 103 mg/dL — ABNORMAL HIGH (ref 70–99)
Potassium: 2.8 mmol/L — ABNORMAL LOW (ref 3.5–5.1)
Sodium: 133 mmol/L — ABNORMAL LOW (ref 135–145)
Total Bilirubin: 1.8 mg/dL — ABNORMAL HIGH (ref 0.3–1.2)
Total Protein: 6.3 g/dL — ABNORMAL LOW (ref 6.5–8.1)

## 2018-06-06 LAB — URINALYSIS, ROUTINE W REFLEX MICROSCOPIC
Bilirubin Urine: NEGATIVE
Glucose, UA: NEGATIVE mg/dL
Hgb urine dipstick: NEGATIVE
KETONES UR: NEGATIVE mg/dL
Leukocytes, UA: NEGATIVE
NITRITE: NEGATIVE
PH: 7 (ref 5.0–8.0)
PROTEIN: NEGATIVE mg/dL
Specific Gravity, Urine: 1.04 — ABNORMAL HIGH (ref 1.005–1.030)

## 2018-06-06 LAB — LIPID PANEL
CHOL/HDL RATIO: 13.2 ratio
Cholesterol: 198 mg/dL (ref 0–200)
HDL: 15 mg/dL — AB (ref >40)
LDL Cholesterol: 159 mg/dL — ABNORMAL HIGH (ref 0–99)
Triglycerides: 122 mg/dL (ref <150)
VLDL: 24 mg/dL (ref 0–40)

## 2018-06-06 LAB — HEPARIN LEVEL (UNFRACTIONATED)

## 2018-06-06 LAB — ECHOCARDIOGRAM LIMITED
Height: 67 in
Weight: 2310.42 oz

## 2018-06-06 LAB — CK TOTAL AND CKMB (NOT AT ARMC)
CK, MB: 2.8 ng/mL (ref 0.5–5.0)
Relative Index: INVALID (ref 0.0–2.5)
Total CK: 46 U/L (ref 38–234)

## 2018-06-06 LAB — TROPONIN I
TROPONIN I: 0.26 ng/mL — AB (ref <0.03)
Troponin I: 0.16 ng/mL (ref <0.03)

## 2018-06-06 LAB — APTT: aPTT: 37 seconds — ABNORMAL HIGH (ref 24–36)

## 2018-06-06 MED ORDER — IOPAMIDOL (ISOVUE-370) INJECTION 76%
INTRAVENOUS | Status: AC
  Administered 2018-06-06: 12:00:00
  Filled 2018-06-06: qty 100

## 2018-06-06 MED ORDER — HEPARIN BOLUS VIA INFUSION
2000.0000 [IU] | Freq: Once | INTRAVENOUS | Status: AC
  Administered 2018-06-06: 2000 [IU] via INTRAVENOUS
  Filled 2018-06-06: qty 2000

## 2018-06-06 MED ORDER — OXYCODONE HCL 5 MG PO TABS
5.0000 mg | ORAL_TABLET | ORAL | Status: DC | PRN
  Administered 2018-06-07 – 2018-06-10 (×5): 5 mg via ORAL
  Filled 2018-06-06 (×5): qty 1

## 2018-06-06 MED ORDER — POTASSIUM CHLORIDE CRYS ER 20 MEQ PO TBCR
40.0000 meq | EXTENDED_RELEASE_TABLET | Freq: Once | ORAL | Status: AC
  Administered 2018-06-06: 40 meq via ORAL
  Filled 2018-06-06: qty 2

## 2018-06-06 MED ORDER — CLOPIDOGREL BISULFATE 75 MG PO TABS
300.0000 mg | ORAL_TABLET | Freq: Once | ORAL | Status: AC
  Administered 2018-06-06: 300 mg via ORAL
  Filled 2018-06-06: qty 4

## 2018-06-06 MED ORDER — METOPROLOL TARTRATE 25 MG PO TABS
25.0000 mg | ORAL_TABLET | Freq: Two times a day (BID) | ORAL | Status: DC
  Administered 2018-06-06 (×2): 25 mg via ORAL
  Filled 2018-06-06 (×2): qty 1

## 2018-06-06 MED ORDER — IOPAMIDOL (ISOVUE-370) INJECTION 76%
100.0000 mL | Freq: Once | INTRAVENOUS | Status: AC | PRN
  Administered 2018-06-06: 100 mL via INTRAVENOUS

## 2018-06-06 MED ORDER — CLOPIDOGREL BISULFATE 75 MG PO TABS
75.0000 mg | ORAL_TABLET | Freq: Every day | ORAL | Status: DC
  Administered 2018-06-07 – 2018-06-11 (×5): 75 mg via ORAL
  Filled 2018-06-06 (×5): qty 1

## 2018-06-06 MED ORDER — PERFLUTREN LIPID MICROSPHERE
1.0000 mL | INTRAVENOUS | Status: DC | PRN
  Administered 2018-06-06: 2 mL via INTRAVENOUS
  Filled 2018-06-06: qty 10

## 2018-06-06 MED ORDER — HEPARIN (PORCINE) IN NACL 100-0.45 UNIT/ML-% IJ SOLN
1300.0000 [IU]/h | INTRAMUSCULAR | Status: AC
  Filled 2018-06-06: qty 250

## 2018-06-06 MED ORDER — TRAMADOL HCL 50 MG PO TABS
50.0000 mg | ORAL_TABLET | Freq: Two times a day (BID) | ORAL | Status: DC | PRN
  Administered 2018-06-07: 50 mg via ORAL
  Filled 2018-06-06: qty 1

## 2018-06-06 MED ORDER — ENOXAPARIN SODIUM 80 MG/0.8ML ~~LOC~~ SOLN
65.0000 mg | Freq: Two times a day (BID) | SUBCUTANEOUS | Status: DC
  Administered 2018-06-06 – 2018-06-08 (×4): 65 mg via SUBCUTANEOUS
  Filled 2018-06-06 (×4): qty 0.8

## 2018-06-06 MED ORDER — ACETAMINOPHEN 325 MG PO TABS
650.0000 mg | ORAL_TABLET | Freq: Four times a day (QID) | ORAL | Status: DC | PRN
  Administered 2018-06-10: 650 mg via ORAL
  Filled 2018-06-06: qty 2

## 2018-06-06 NOTE — Consult Note (Signed)
Cardiology Consultation Note    Patient ID: Lauren Richard, MRN: [id number], DOB/AGE: [birth year] 82 y.o. Admit date: 06/05/2018   Date of Consult: 06/06/2018 Primary Physician: Glendale Chard, MD Primary Cardiologist: Dr. Oval Linsey  Chief Complaint: Weakness Reason for Consultation: Acute coronary syndrome Requesting MD: Dr. Maudie Mercury  HPI: Lauren Richard is a 82 y.o. female with a history of metastatic pancreatic cancer currently on undergoing oral chemotherapy no significant cardiac history who initially presented with generalized weakness and lightheadedness.  The patient has been doing reasonably well considering her metastatic cancer diagnosis.  She is currently taking oral chemotherapy and other than poor appetite, does not usually have any significant complaints at baseline.  This morning, she woke up feeling extremely fatigued and generally weak.  She noted some lightheadedness/presyncopal sensations, but never passed out completely.  At no point did she have any chest pains or shortness of breath.  She does have some mild abdominal discomfort at the site of her Whipple procedure.  She was planning to attend a doctor's appointment today, but presented to the ED instead, and given her profound weakness.  In the ED, she was initially tachycardic.  Initial ECG showed concern for ST elevation in the inferior and apical leads.  Subsequent ECGs showed improvement in ST segments.  There was some concern for AF with RVR, so she was given IV diltiazem for rate control.  Her labs are notable for normal creatinine, with mild elevation in AST, ALT and T bili.  Her white blood cell count was 12.5.  Her troponin I was 0.42.  She was started on IV heparin, admitted to the hospitalist service and transferred to [facility] for further management.  Upon my interview, the patient feels relatively well.  She is still feels mildly weak, although much improved from prior.  She continues to deny any chest pains or  shortness of breath.  Past Medical History:  Diagnosis Date  . Adenocarcinoma of head of pancreas ([place]) 11/24/2017  . Arthritis    "all over" (04/26/2017); knees, shoulder  . Essential hypertension 03/03/2017  . Family history of breast cancer   . Family history of kidney cancer   . Family history of pancreatic cancer   . Family history of prostate cancer   . History of blood transfusion 1979   "when I had my hysterectomy"  . Hyperlipidemia   . Hypothyroidism   . Obesity 03/03/2017  . Osteoarthritis 03/03/2017  . Pneumonia    as a child      Surgical History:  Past Surgical History:  Procedure Laterality Date  . BREAST CYST EXCISION Right    "cut 6 cysts out"  . CYST EXCISION Left    "off my toe"  . DILATION AND CURETTAGE OF UTERUS     "before hysterectomy"  . ERCP N/A 11/04/2017   Procedure: ENDOSCOPIC RETROGRADE CHOLANGIOPANCREATOGRAPHY (ERCP);  Surgeon: Carol Ada, MD;  Location: [place];  Service: Endoscopy;  Laterality: N/A;  . ESOPHAGOGASTRODUODENOSCOPY N/A 04/24/2018   Procedure: ESOPHAGOGASTRODUODENOSCOPY (EGD);  Surgeon: Jerene Bears, MD;  Location: [facility];  Service: Gastroenterology;  Laterality: N/A;  . EUS N/A 11/11/2017   Procedure: UPPER ENDOSCOPIC ULTRASOUND (EUS) LINEAR;  Surgeon: Carol Ada, MD;  Location: [facility];  Service: Endoscopy;  Laterality: N/A;  . FINE NEEDLE ASPIRATION N/A 11/11/2017   Procedure: FINE NEEDLE ASPIRATION (FNA) LINEAR;  Surgeon: Carol Ada, MD;  Location: [facility];  Service: Endoscopy;  Laterality: N/A;  . INGUINAL HERNIA REPAIR Right   .  LAPAROSCOPY N/A 01/05/2018   Procedure: LAPAROSCOPY DIAGNOSTIC ERAS PATHWAY;  Surgeon: Stark Klein, MD;  Location: [place];  Service: General;  Laterality: N/A;  [place]  . PORTACATH PLACEMENT Left 04/11/2018   Procedure: INSERTION PORT-A-CATH;  Surgeon: Stark Klein, MD;  Location: [place];  Service: General;  Laterality: Left;  . REDUCTION MAMMAPLASTY Bilateral 1980  .  TONSILLECTOMY     "when I small"  . TOTAL ABDOMINAL HYSTERECTOMY  1979  . TOTAL KNEE ARTHROPLASTY Left 04/26/2017   Procedure: LEFT TOTAL KNEE ARTHROPLASTY;  Surgeon: [name], MD;  Location: [place];  Service: Orthopedics;  Laterality: Left;  . WHIPPLE PROCEDURE N/A 01/05/2018   Procedure: WHIPPLE PROCEDURE;  Surgeon: Stark Klein, MD;  Location: [place];  Service: General;  Laterality: N/A;     Home Meds: Prior to Admission medications   Medication Sig Start Date End Date Taking? Authorizing Provider  capecitabine (XELODA) 500 MG tablet Take 2 tablets (1000mg ) by mouth 2 times daily, within 30 min of finishing food. Take on days 1-7 and 15-21 of each 28 day cycle 05/01/18  Yes Truitt Merle, MD  feeding supplement, ENSURE ENLIVE, (ENSURE ENLIVE) LIQD Take 237 mLs by mouth 3 (three) times daily between meals. 04/25/18  Yes Mariel Aloe, MD  gabapentin (NEURONTIN) 100 MG capsule Take 2 capsules (200 mg total) by mouth 2 (two) times daily. 01/13/18  Yes Stark Klein, MD  lidocaine-prilocaine (EMLA) cream Apply 1 application topically as needed. 04/14/18  Yes Truitt Merle, MD  mirtazapine (REMERON) 7.5 MG tablet Take 1 tablet (7.5 mg total) by mouth at bedtime. 05/23/18  Yes Truitt Merle, MD  ondansetron (ZOFRAN) 8 MG tablet Take 8 mg by mouth 2 (two) times daily as needed for nausea/vomiting. 03/23/18  Yes [provider]  pantoprazole (PROTONIX) 40 MG tablet Take 1 tablet (40 mg total) by mouth daily at 12 noon. Patient taking differently: Take 40 mg by mouth daily as needed (heartburn).  01/13/18  Yes Stark Klein, MD  potassium chloride SA (K-DUR,KLOR-CON) 20 MEQ tablet Take 2 tablets (40 mEq total) by mouth daily. Patient taking differently: Take 60 mEq by mouth daily.  04/20/18  Yes Truitt Merle, MD  PRESCRIPTION MEDICATION Apply 1 application topically 4 (four) times daily as needed (for back pain). Dic 3%, Bac 2%, lid - pril 2-5%, Menth 1% cream    Yes [provider]    prochlorperazine (COMPAZINE) 10 MG tablet Take 10 mg by mouth every 6 (six) hours as needed for nausea or vomiting.   Yes [provider]  rivaroxaban (XARELTO) 20 MG TABS tablet Take 1 tablet (20 mg total) by mouth daily with supper. 04/04/18  Yes Sheikh, Omair Latif, DO  traMADol (ULTRAM) 50 MG tablet Take 50 mg by mouth every 12 (twelve) hours as needed (for pain.).   Yes [provider]  trolamine salicylate (ASPERCREME) 10 % cream Apply 1 application topically 4 (four) times daily as needed (for knee pain/muscle pain.).    Yes [provider]  nystatin (MYCOSTATIN) 100000 UNIT/ML suspension Take 5 mLs (500,000 Units total) by mouth 4 (four) times daily. Patient not taking: Reported on 06/05/2018 03/24/18   Alla Feeling, NP    Inpatient Medications:  . feeding supplement (ENSURE ENLIVE)  237 mL Oral TID BM  . gabapentin  200 mg Oral BID  . mirtazapine  7.5 mg Oral QHS  . potassium chloride SA  60 mEq Oral Daily   . sodium chloride 50 mL/hr at 06/05/18 2234  .  heparin 1,100 Units/hr (06/05/18 2239)    Allergies: No Known Allergies  Social History   Socioeconomic History  . Marital status: Widowed    Spouse name: Not on file  . Number of children: Not on file  . Years of education: Not on file  . Highest education level: Not on file  Occupational History  . Not on file  Social Needs  . Financial resource strain: Not on file  . Food insecurity:    Worry: Not on file    Inability: Not on file  . Transportation needs:    Medical: Not on file    Non-medical: Not on file  Tobacco Use  . Smoking status: Never Smoker  . Smokeless tobacco: Never Used  Substance and Sexual Activity  . Alcohol use: No    Alcohol/week: 0.0 standard drinks    Frequency: Never    Comment: occasional glass of wine   . Drug use: No  . Sexual activity: Never  Lifestyle  . Physical activity:    Days per week: Not on file    Minutes per session: Not on file  . Stress:  Not on file  Relationships  . Social connections:    Talks on phone: Not on file    Gets together: Not on file    Attends religious service: Not on file    Active member of club or organization: Not on file    Attends meetings of clubs or organizations: Not on file    Relationship status: Not on file  . Intimate partner violence:    Fear of current or ex partner: Not on file    Emotionally abused: Not on file    Physically abused: Not on file    Forced sexual activity: Not on file  Other Topics Concern  . Not on file  Social History Narrative  . Not on file     Family History  Problem Relation Age of Onset  . Kidney cancer Mother 59  . Lung cancer Father 23       lung, smoker  . Congestive Heart Failure Sister   . Asthma Sister   . Atrial fibrillation Sister   . Hypertension Sister   . Diabetes Sister   . Head & neck cancer Paternal Grandmother   . Cancer Maternal Uncle        type unk  . Pancreatic cancer Paternal Aunt        dx >50  . Cancer Paternal Uncle        type unk  . Breast cancer Cousin 31  . Prostate cancer Other 1       had robotic surgery     Review of Systems: All other systems reviewed and are otherwise negative except as noted above.  Labs: No results for input(s): CKTOTAL, CKMB, TROPONINI in the last 72 hours. Lab Results  Component Value Date   WBC 12.5 (H) 06/05/2018   HGB 12.3 06/05/2018   HCT 36.2 06/05/2018   MCV 97.1 06/05/2018   PLT 328 06/05/2018    Recent Labs  Lab 06/05/18 1933  NA 135  K 4.4  CL 101  CO2 24  BUN 11  CREATININE 0.91  CALCIUM 8.7*  PROT 7.5  BILITOT 1.9*  ALKPHOS 181*  ALT 55*  AST 123*  GLUCOSE 90   No results found for: CHOL, HDL, LDLCALC, TRIG Lab Results  Component Value Date   DDIMER 2.06 (H) 06/05/2018    Radiology/Studies:  Dg Chest 2 View  Result Date: 06/05/2018 CLINICAL DATA:  82 year old female with generalized weakness EXAM: CHEST - 2 VIEW COMPARISON:  Prior chest x-ray 04/11/2018  FINDINGS: Stable cardiac and mediastinal contours. A left subclavian approach single-lumen power injectable portacatheter is present. The tip of the catheter has retracted some and now overlies the left innominate vein. No evidence of focal airspace consolidation, infiltrate, pleural effusion or pneumothorax. Inspiratory volumes are slightly low. Chronic bronchitic changes appear similar compared to prior. No acute osseous abnormality. IMPRESSION: 1. No acute cardiopulmonary process. 2. The tip of the left IJ approach single-lumen power injectable port catheter has retracted slightly and now lies within the left innominate vein. Electronically Signed   By: Jacqulynn Cadet M.D.   On: 06/05/2018 15:32   Ct Head Wo Contrast  Result Date: 06/05/2018 CLINICAL DATA:  Muscle weakness EXAM: CT HEAD WITHOUT CONTRAST TECHNIQUE: Contiguous axial images were obtained from the base of the skull through the vertex without intravenous contrast. COMPARISON:  Head CT 04/22/2018 FINDINGS: Brain: There is no mass, hemorrhage or extra-axial collection. There is mild generalized atrophy without lobar predilection. There is no acute or chronic infarction. The brain parenchyma is normal. Vascular: No abnormal hyperdensity of the major intracranial arteries or dural venous sinuses. No intracranial atherosclerosis. Skull: The visualized skull base, calvarium and extracranial soft tissues are normal. Sinuses/Orbits: No fluid levels or advanced mucosal thickening of the visualized paranasal sinuses. No mastoid or middle ear effusion. The orbits are normal. IMPRESSION: Mild parenchymal volume loss without acute intracranial abnormality. Electronically Signed   By: Ulyses Jarred M.D.   On: 06/05/2018 21:55    Wt Readings from Last 3 Encounters:  06/05/18 67.9 kg  05/23/18 67.9 kg  05/01/18 74 kg    EKG: Sinus tachycardia, ST elevation and Q waves in the inferior leads, borderline ST elevation in V3 through V6.  Physical  Exam: Blood pressure 108/73, pulse (!) 113, temperature 97.9 F (36.6 C), temperature source Oral, resp. rate (!) 24, height 5\' 7"  (1.702 m), weight 67.9 kg, SpO2 96 %. Body mass index is 23.45 kg/m. General: Well developed, well nourished, in no acute distress. Head: Normocephalic, atraumatic, sclera non-icteric, no xanthomas, nares are without discharge.  Neck: Negative for carotid bruits. JVD not elevated. Lungs: Clear bilaterally to auscultation without wheezes, rales, or rhonchi. Breathing is unlabored. Heart: RRR with S1 S2. No murmurs, rubs, or gallops appreciated. Abdomen: Soft, non-tender, non-distended with normoactive bowel sounds. No hepatomegaly. No rebound/guarding. No obvious abdominal masses. Msk:  Strength and tone appear normal for age. Extremities: No clubbing or cyanosis. No edema.  Distal pedal pulses are 2+ and equal bilaterally. Neuro: Alert and oriented X 3. No facial asymmetry. No focal deficit. Moves all extremities spontaneously. Psych:  Responds to questions appropriately with a normal affect.     Assessment and Plan  82 year old lady with metastatic pancreatic cancer who presents with an acute coronary syndrome concerning for ST elevation MI.  At this point, she is both hemodynamically and electrically stable and her symptoms are relatively well managed.  Given her poor overall prognosis related to her cancer, I favor an initial trial of medical therapy for ACS.  In speaking with the patient, she is also in favor of a more conservative approach upfront, however, would not completely rule out an invasive procedure if she were to have ongoing symptoms.  As such, we will plan to continue IV heparin drip for now.  Recommend loading with 300 mg of Plavix now, followed by 75 mg of Plavix  daily for maintenance starting in the morning.  Recommend echocardiogram to check LV function.  The patient tells me that her niece (who is her healthcare power of attorney) will be here in  the morning, along with a number of her sisters.  Given the situation, goals of care discussions will be instrumental in determining further management going forward.  Cardiology will follow along with you.  Signed, Doylene Canning, MD 06/06/2018, 12:10 AM

## 2018-06-06 NOTE — Progress Notes (Signed)
  Echocardiogram 2D Echocardiogram has been performed.  Jennette Dubin 06/06/2018, 11:37 AM

## 2018-06-06 NOTE — Progress Notes (Signed)
[place] for Heparin to Lovenox Indication: chest pain/ACS  Hx PE on Xarelto 20mg  daily, last dose 8/25 at 0900  No Known Allergies  Patient Measurements: Height: 5\' 7"  (170.2 cm) Weight: 144 lb 6.4 oz (65.5 kg) IBW/kg (Calculated) : 61.6 Heparin Dosing Weight: 67.9 kg  Vital Signs: Temp: 98.1 F (36.7 C) (08/27 1157) Temp Source: Oral (08/27 0902) BP: 87/55 (08/27 1157) Pulse Rate: 113 (08/27 1157)  Labs: Recent Labs    06/05/18 1933 06/06/18 0126 06/06/18 1001  HGB 12.3  --  10.5*  HCT 36.2  --  33.2*  PLT 328  --  233  APTT 32  --  37*  LABPROT 14.5  --   --   INR 1.13  --   --   HEPARINUNFRC  --   --  <0.10*  CREATININE 0.91  --  0.82  CKTOTAL  --  46  --   CKMB  --  2.8  --   TROPONINI  --  0.26* 0.16*   Estimated Creatinine Clearance: 53.2 mL/min (by C-G formula based on SCr of 0.82 mg/dL).  Assessment:  64 yoF to ED with c/o weakness/dizziness.  Hx of Pancreatic Ca s/p Whipple, last chemo C2 d1 Abraxane/Gemzaar  To switch heparin to Lovenox due to difficulty with lab draws Last Xarelto dose 8/25 at 9 am  Goal of Therapy:  Appropriate Lovenox dosing Target aPTT 66 to 102 sec Monitor platelets by anticoagulation protocol: Yes   Plan:  DC heparin at 1700 pm Begin Lovenox 65 mg sq Q 12 hours starting at 1800 pm  Thank you Anette Guarneri, PharmD [phone]  06/06/2018, 2:06 PM

## 2018-06-06 NOTE — Progress Notes (Signed)
Received patient from Echo lab.

## 2018-06-06 NOTE — Progress Notes (Signed)
[facility] TEAM 1 - Stepdown/ICU TEAM  Lauren Richard  SWH:[id number] DOB: [birth year] DOA: 06/05/2018 PCP: Glendale Chard, MD    Brief Narrative:  82 y.o. F w/ a hx of hypothyroidism, hypertension, and pancreatic cancer s/p whipple who presented w/ generalized weakness, dizziness, and dyspnea.    In the ED a CT brain was negative, Trop was 0.42, and an EKG noted sinus tachy at 129 w/ ST elevation in 2,3, aVF, and significant T inversions in V4-6.  Cardiology was consulted but did not feel she was a candidate for acute intervention.    Subjective: The tells me she feels much better today.  She denies current chest pain dyspnea nausea vomiting or abdominal pain.  She does admit to poor appetite.  She reports that she still feels weak in general and does not believe she will be able to walk right now.  Assessment & Plan:  Sinus tachycardia CTa negative for PE - pt already on anticoag - tachycardia resolving - follow trend - check TSH   ACS / STEMI Medical management only is the tx plan at this time - troponin has not become markedly elevated and appears to be trending down - ?if this was not simply rate related   Recent Labs  Lab 06/06/18 0126 06/06/18 1001  TROPONINI 0.26* 0.16*    Dyspnea Due to persistent tachycardia - has resolved w/ slower HR  PE diagnoses 03/09/18 On chronic anticoagulation - transitioned to lovenox for now, but will plan to quickly return to oral anticoag if remains stable   Hypokalemia  Due to poor oral intake - supplement and follow - check Mg  Hyponatremia  Likely hypovolemic in nature - gently hydrating - f/u in AM   Transaminitis - Liver Mets Severe fatty infiltrate of liver noted w/ multiple liver lesions noted on CT could explain this   History of pancreatic cancer Was on oral chemo w/ Xeloda but has been on hold due to intolerance   GERD  DVT prophylaxis: lovenox  Code Status: FULL CODE Family Communication: no family present at time of  exam  Disposition Plan: tele bed   Consultants:  [facility]   Antimicrobials:  none  Objective: Blood pressure (!) 87/55, pulse (!) 113, temperature 98.1 F (36.7 C), resp. rate (!) 33, height 5\' 7"  (1.702 m), weight 65.5 kg, SpO2 97 %.  Intake/Output Summary (Last 24 hours) at 06/06/2018 1415 Last data filed at 06/06/2018 1211 Gross per 24 hour  Intake 1638.93 ml  Output 280 ml  Net 1358.93 ml   Filed Weights   06/05/18 1439 06/06/18 0500  Weight: 67.9 kg 65.5 kg    Examination: General: No acute respiratory distress Lungs: Clear to auscultation bilaterally without wheezes or crackles Cardiovascular: tachycardic at 100bpm - no M or rub  Abdomen: Nontender, nondistended, soft, bowel sounds positive, no rebound, no ascites, no appreciable mass Extremities: No significant cyanosis, clubbing, or edema bilateral lower extremities  CBC: Recent Labs  Lab 06/05/18 1933 06/06/18 1001  WBC 12.5* 9.1  NEUTROABS 8.7*  --   HGB 12.3 10.5*  HCT 36.2 33.2*  MCV 97.1 100.3*  PLT 328 665   Basic Metabolic Panel: Recent Labs  Lab 06/05/18 1933 06/06/18 1001  NA 135 133*  K 4.4 2.8*  CL 101 105  CO2 24 18*  GLUCOSE 90 103*  BUN 11 8  CREATININE 0.91 0.82  CALCIUM 8.7* 7.6*   GFR: Estimated Creatinine Clearance: 53.2 mL/min (by C-G formula based on SCr of 0.82  mg/dL).  Liver Function Tests: Recent Labs  Lab 06/05/18 1933 06/06/18 1001  AST 123* 102*  ALT 55* 44  ALKPHOS 181* 145*  BILITOT 1.9* 1.8*  PROT 7.5 6.3*  ALBUMIN 2.3* 1.7*    Coagulation Profile: Recent Labs  Lab 06/05/18 1933  INR 1.13    Cardiac Enzymes: Recent Labs  Lab 06/06/18 0126 06/06/18 1001  CKTOTAL 46  --   CKMB 2.8  --   TROPONINI 0.26* 0.16*    HbA1C: Hgb A1c MFr Bld  Date/Time Value Ref Range Status  12/30/2017 09:34 AM 4.5 (L) 4.8 - 5.6 % Final    Comment:    (NOTE) Pre diabetes:          5.7%-6.4% Diabetes:              >6.4% Glycemic control for    <7.0% adults with diabetes     CBG: Recent Labs  Lab 06/05/18 1448  GLUCAP 139*    Scheduled Meds: . [START ON 06/07/2018] clopidogrel  75 mg Oral Daily  . enoxaparin (LOVENOX) injection  65 mg Subcutaneous Q12H  . feeding supplement (ENSURE ENLIVE)  237 mL Oral TID BM  . gabapentin  200 mg Oral BID  . metoprolol tartrate  25 mg Oral BID  . mirtazapine  7.5 mg Oral QHS  . potassium chloride SA  60 mEq Oral Daily     LOS: 0 days   Cherene Altes, MD [facility] Office  [phone] Pager - Text Page per Amion  If 7PM-7AM, please contact night-coverage per Amion 06/06/2018, 2:15 PM

## 2018-06-06 NOTE — Progress Notes (Signed)
Heparin bolus and increase rate were given at 1211 pm today.  While the bolus was infusing, the patient c/o pain at the IV site in the right upper arm.  Site did not appear to be infiltrated, however, IVF was changed to the Right AC.  Susy Frizzle, notified.

## 2018-06-06 NOTE — Progress Notes (Signed)
   Progress Note  Came to see patient earlier   She was gone for echo Came back in later evening   She was sleeping comfortablly   Did not disturb  I have reviewed echo that she had today   Echo shows severe LV dysfunction consistent with Takotsubo's cardiomyopathy from spasm or MI from occlusion of proximal LAD    Neither is acute based on trivial troponin    EKG findings with Q waves and T wave changes are very different from July 2019 as is echo    Subacute problem  I did not disturb pt since she was so comfortable   Will review in am  With pt and family.     Signed, Dorris Carnes, MD  06/06/2018, 9:25 PM

## 2018-06-06 NOTE — Progress Notes (Signed)
CRITICAL VALUE ALERT  Critical Value:  Troponin 0.26  Date & Time Notied:  0120 06/06/18  Provider Notified: Attending  Orders Received/Actions taken: Continue to monitor.

## 2018-06-06 NOTE — Progress Notes (Signed)
Spoke with RN regarding question about PAC. States no blood return but flushes well and no signs of needle being malpositioned. Instructed to request lab to draw blood and speak with MD if unable to obtain lab work.

## 2018-06-06 NOTE — Progress Notes (Signed)
[place] for Heparin Indication: chest pain/ACS  Hx PE on Xarelto 20mg  daily, last dose 8/25 at 0900  No Known Allergies  Patient Measurements: Height: 5\' 7"  (170.2 cm) Weight: 144 lb 6.4 oz (65.5 kg) IBW/kg (Calculated) : 61.6 Heparin Dosing Weight: 67.9 kg  Vital Signs: Temp: 97.6 F (36.4 C) (08/27 0902) Temp Source: Oral (08/27 0902) BP: 101/73 (08/27 0902) Pulse Rate: 109 (08/27 0456)  Labs: Recent Labs    06/05/18 1933 06/06/18 0126 06/06/18 1001  HGB 12.3  --  10.5*  HCT 36.2  --  33.2*  PLT 328  --  233  APTT 32  --  37*  LABPROT 14.5  --   --   INR 1.13  --   --   HEPARINUNFRC  --   --  <0.10*  CREATININE 0.91  --  0.82  CKTOTAL  --  46  --   CKMB  --  2.8  --   TROPONINI  --  0.26* 0.16*   Estimated Creatinine Clearance: 53.2 mL/min (by C-G formula based on SCr of 0.82 mg/dL).  Assessment:  56 yoF to ED with c/o weakness/dizziness.  Hx of Pancreatic Ca s/p Whipple, last chemo C2 d1 Abraxane/Gemzaar  Continues on heparin for ACS, initial heparin level low at < 0.10, PTT low at 37 seconds Last Xarelto dose 8/25 at 9 am  Goal of Therapy:  Heparin level 0.3-0.7 units/ml  Target aPTT 66 to 102 sec Monitor platelets by anticoagulation protocol: Yes   Plan:  Heparin 2000 units iv bolus x 1 Heparin drip to 1300 units / hr Heparin level in 8 hours  Thank you Anette Guarneri, PharmD [phone]  06/06/2018, 11:30 AM

## 2018-06-06 NOTE — Plan of Care (Signed)
Care plans reviewed and patient is progressing.  

## 2018-06-06 NOTE — Progress Notes (Signed)
Patient transported to 2D echo.

## 2018-06-07 DIAGNOSIS — I5181 Takotsubo syndrome: Secondary | ICD-10-CM

## 2018-06-07 DIAGNOSIS — I5023 Acute on chronic systolic (congestive) heart failure: Secondary | ICD-10-CM

## 2018-06-07 DIAGNOSIS — R627 Adult failure to thrive: Secondary | ICD-10-CM

## 2018-06-07 DIAGNOSIS — R740 Nonspecific elevation of levels of transaminase and lactic acid dehydrogenase [LDH]: Secondary | ICD-10-CM

## 2018-06-07 LAB — TSH: TSH: 1.02 u[IU]/mL (ref 0.350–4.500)

## 2018-06-07 LAB — COMPREHENSIVE METABOLIC PANEL
ALT: 40 U/L (ref 0–44)
AST: 80 U/L — AB (ref 15–41)
Albumin: 1.8 g/dL — ABNORMAL LOW (ref 3.5–5.0)
Alkaline Phosphatase: 149 U/L — ABNORMAL HIGH (ref 38–126)
Anion gap: 7 (ref 5–15)
BUN: 9 mg/dL (ref 8–23)
CHLORIDE: 108 mmol/L (ref 98–111)
CO2: 19 mmol/L — ABNORMAL LOW (ref 22–32)
CREATININE: 0.76 mg/dL (ref 0.44–1.00)
Calcium: 8 mg/dL — ABNORMAL LOW (ref 8.9–10.3)
GFR calc Af Amer: >60 mL/min (ref >60)
Glucose, Bld: 105 mg/dL — ABNORMAL HIGH (ref 70–99)
Potassium: 4.8 mmol/L (ref 3.5–5.1)
Sodium: 134 mmol/L — ABNORMAL LOW (ref 135–145)
Total Bilirubin: 1.4 mg/dL — ABNORMAL HIGH (ref 0.3–1.2)
Total Protein: 6.5 g/dL (ref 6.5–8.1)

## 2018-06-07 LAB — CBC
HEMATOCRIT: 31.7 % — AB (ref 36.0–46.0)
Hemoglobin: 10.5 g/dL — ABNORMAL LOW (ref 12.0–15.0)
MCH: 32.5 pg (ref 26.0–34.0)
MCHC: 33.1 g/dL (ref 30.0–36.0)
MCV: 98.1 fL (ref 78.0–100.0)
Platelets: 225 10*3/uL (ref 150–400)
RBC: 3.23 MIL/uL — ABNORMAL LOW (ref 3.87–5.11)
RDW: 14.5 % (ref 11.5–15.5)
WBC: 9.4 10*3/uL (ref 4.0–10.5)

## 2018-06-07 LAB — LIPID PANEL
CHOLESTEROL: 151 mg/dL (ref 0–200)
HDL: 12 mg/dL — ABNORMAL LOW (ref >40)
LDL CALC: 119 mg/dL — AB (ref 0–99)
Total CHOL/HDL Ratio: 12.6 RATIO
Triglycerides: 100 mg/dL (ref <150)
VLDL: 20 mg/dL (ref 0–40)

## 2018-06-07 LAB — MAGNESIUM: Magnesium: 1.2 mg/dL — ABNORMAL LOW (ref 1.7–2.4)

## 2018-06-07 LAB — TROPONIN I: TROPONIN I: 0.1 ng/mL — AB (ref <0.03)

## 2018-06-07 MED ORDER — METOPROLOL TARTRATE 12.5 MG HALF TABLET
12.5000 mg | ORAL_TABLET | Freq: Two times a day (BID) | ORAL | Status: DC
  Administered 2018-06-07 – 2018-06-10 (×5): 12.5 mg via ORAL
  Filled 2018-06-07 (×6): qty 1

## 2018-06-07 MED ORDER — SODIUM CHLORIDE 0.9% FLUSH
10.0000 mL | INTRAVENOUS | Status: DC | PRN
  Administered 2018-06-07: 20 mL
  Filled 2018-06-07: qty 40

## 2018-06-07 MED ORDER — MAGNESIUM SULFATE 4 GM/100ML IV SOLN
4.0000 g | Freq: Once | INTRAVENOUS | Status: AC
  Administered 2018-06-07: 4 g via INTRAVENOUS
  Filled 2018-06-07: qty 100

## 2018-06-07 NOTE — Progress Notes (Addendum)
Progress Note  Patient Name: Lauren Richard Date of Encounter: 06/07/2018  Primary Cardiologist: New  Subjective   No CP  Breathing is OK at rest   Inpatient Medications    Scheduled Meds: . clopidogrel  75 mg Oral Daily  . enoxaparin (LOVENOX) injection  65 mg Subcutaneous Q12H  . feeding supplement (ENSURE ENLIVE)  237 mL Oral TID BM  . gabapentin  200 mg Oral BID  . metoprolol tartrate  25 mg Oral BID  . mirtazapine  7.5 mg Oral QHS   Continuous Infusions: . magnesium sulfate 1 - 4 g bolus IVPB 4 g (06/07/18 1112)   PRN Meds: acetaminophen **OR** [DISCONTINUED] acetaminophen, lidocaine-prilocaine, ondansetron (ZOFRAN) IV, oxyCODONE, pantoprazole, prochlorperazine, sodium chloride flush, traMADol   Vital Signs    Vitals:   06/07/18 0407 06/07/18 0622 06/07/18 0830 06/07/18 1200  BP: 90/63  99/66 98/70  Pulse: 84  83 88  Resp: (!) 22  19 17   Temp:   98.2 F (36.8 C)   TempSrc:   Oral   SpO2: 98%  98% 98%  Weight:  68.5 kg    Height:       No intake or output data in the 24 hours ending 06/07/18 1304 Filed Weights   06/05/18 1439 06/06/18 0500 06/07/18 0622  Weight: 67.9 kg 65.5 kg 68.5 kg    Telemetry    SR/ST  - Personally Reviewed  ECG    None Personally Reviewed  Physical Exam   GEN:  Ptis a thin 82 yo No acute distress.   Neck: No JVD Cardiac: RRR, no murmurs, rubs, or gallops.  Respiratory: Clear to auscultation bilaterally. GI: Soft, nontender, non-distended  MS: No edema; No deformity. Neuro:  Nonfocal  Psych: Normal affect   Labs    Chemistry Recent Labs  Lab 06/05/18 1933 06/06/18 1001 06/07/18 0308  NA 135 133* 134*  K 4.4 2.8* 4.8  CL 101 105 108  CO2 24 18* 19*  GLUCOSE 90 103* 105*  BUN 11 8 9   CREATININE 0.91 0.82 0.76  CALCIUM 8.7* 7.6* 8.0*  PROT 7.5 6.3* 6.5  ALBUMIN 2.3* 1.7* 1.8*  AST 123* 102* 80*  ALT 55* 44 40  ALKPHOS 181* 145* 149*  BILITOT 1.9* 1.8* 1.4*  GFRNONAA 58* >60 >60  GFRAA >60 >60 >60    ANIONGAP 10 10 7      Hematology Recent Labs  Lab 06/05/18 1933 06/06/18 1001 06/07/18 0308  WBC 12.5* 9.1 9.4  RBC 3.73* 3.31* 3.23*  HGB 12.3 10.5* 10.5*  HCT 36.2 33.2* 31.7*  MCV 97.1 100.3* 98.1  MCH 33.0 31.7 32.5  MCHC 34.0 31.6 33.1  RDW 14.8 14.4 14.5  PLT 328 233 225    Cardiac Enzymes Recent Labs  Lab 06/06/18 0126 06/06/18 1001 06/07/18 0308  TROPONINI 0.26* 0.16* 0.10*    Recent Labs  Lab 06/05/18 1715  TROPIPOC 0.42*     BNPNo results for input(s): BNP, PROBNP in the last 168 hours.   DDimer  Recent Labs  Lab 06/05/18 1933  DDIMER 2.06*     Radiology    Dg Chest 2 View  Result Date: 06/05/2018 CLINICAL DATA:  82 year old female with generalized weakness EXAM: CHEST - 2 VIEW COMPARISON:  Prior chest x-ray 04/11/2018 FINDINGS: Stable cardiac and mediastinal contours. A left subclavian approach single-lumen power injectable portacatheter is present. The tip of the catheter has retracted some and now overlies the left innominate vein. No evidence of focal airspace consolidation, infiltrate, pleural effusion  or pneumothorax. Inspiratory volumes are slightly low. Chronic bronchitic changes appear similar compared to prior. No acute osseous abnormality. IMPRESSION: 1. No acute cardiopulmonary process. 2. The tip of the left IJ approach single-lumen power injectable port catheter has retracted slightly and now lies within the left innominate vein. Electronically Signed   By: Jacqulynn Cadet M.D.   On: 06/05/2018 15:32   Ct Head Wo Contrast  Result Date: 06/05/2018 CLINICAL DATA:  Muscle weakness EXAM: CT HEAD WITHOUT CONTRAST TECHNIQUE: Contiguous axial images were obtained from the base of the skull through the vertex without intravenous contrast. COMPARISON:  Head CT 04/22/2018 FINDINGS: Brain: There is no mass, hemorrhage or extra-axial collection. There is mild generalized atrophy without lobar predilection. There is no acute or chronic infarction. The  brain parenchyma is normal. Vascular: No abnormal hyperdensity of the major intracranial arteries or dural venous sinuses. No intracranial atherosclerosis. Skull: The visualized skull base, calvarium and extracranial soft tissues are normal. Sinuses/Orbits: No fluid levels or advanced mucosal thickening of the visualized paranasal sinuses. No mastoid or middle ear effusion. The orbits are normal. IMPRESSION: Mild parenchymal volume loss without acute intracranial abnormality. Electronically Signed   By: Ulyses Jarred M.D.   On: 06/05/2018 21:55   Ct Angio Chest Pe W Or Wo Contrast  Result Date: 06/06/2018 CLINICAL DATA:  82 year old female with concern for pulmonary embolism. History of pancreatic cancer status post prior Whipple procedure. EXAM: CT ANGIOGRAPHY CHEST WITH CONTRAST TECHNIQUE: Multidetector CT imaging of the chest was performed using the standard protocol during bolus administration of intravenous contrast. Multiplanar CT image reconstructions and MIPs were obtained to evaluate the vascular anatomy. CONTRAST:  156mL ISOVUE-370 IOPAMIDOL (ISOVUE-370) INJECTION 76% COMPARISON:  Chest CT dated 03/09/2018 and chest radiograph dated 06/05/2018 FINDINGS: Cardiovascular: There is no cardiomegaly or pericardial effusion. Coronary vascular calcification. Mild atherosclerotic calcification of the thoracic aorta. Linear band in the right lower lobe pulmonary artery as well as small band like density in the subsegmental right lower lobe branch consistent with chronic thrombus/scarring and similar to prior CT. No CT evidence of acute pulmonary artery embolus. Mediastinum/Nodes: No hilar or mediastinal adenopathy. Esophagus and the thyroid gland are grossly unremarkable. No mediastinal fluid collection. Lungs/Pleura: There are bibasilar dependent atelectatic changes primarily involving the lung bases. No focal consolidation, pleural effusion, or pneumothorax. The central airways are patent. Upper Abdomen:  Advanced fatty infiltration of the liver. Multiple hepatic lesions again noted and previously described as metastatic disease. Musculoskeletal: No chest wall abnormality. No acute or significant osseous findings. Review of the MIP images confirms the above findings. IMPRESSION: 1. No acute intrathoracic pathology. No CT evidence of acute pulmonary artery embolus. 2. Severe fatty infiltration of the liver with multiple liver lesions. Electronically Signed   By: Anner Crete M.D.   On: 06/06/2018 03:37    Cardiac Studies   Echo 8/27  Patient Profile       Assessment & Plan    1   Acute on chronic systolic CHF   Pt had echo yesterday that showed severe LV dysfunciton   Wall motion consistent with possible Takotsubo's vs prox LAD occlusion.  Pt denies CP   Just fatigue   Findings are very different from echo in July 2019    I have reviewed with pt and family   Given not acute presentation and other medical issues I would reocmm continued medical Rx    I would not recomm invasive Rx   PT agrees  Will contact niece as well  Follow BP   Dose b blocker as tolerates  Will decrease to 12.5 bid     2  Pancreatic CA  3  Disposition.   SPoke to pt, patient's sister and Patient's niece regarding clinical condition.   With severe LVEF and problable CAD she is at increased risk for arrhythmias.   Currently full code   Reviewed what would happen if developed life threatening rhythms.   They will reflect on what she wants done.   For questions or updates, please contact [place] Please consult [email or web address] for contact info under Cardiology/STEMI.      Signed, Dorris Carnes, MD  06/07/2018, 1:04 PM

## 2018-06-07 NOTE — Progress Notes (Signed)
PROGRESS NOTE                                                                                                                                                                                                             Patient Demographics:    Lauren Richard, is a 81 y.o. female, DOB - [birth year], FFM:[id number]  Admit date - 06/05/2018   Admitting Physician Jani Gravel, MD  Outpatient Primary MD for the patient is Glendale Chard, MD  LOS - 1  Outpatient Specialists: Oncology ( Dr Burr Medico)  Chief Complaint  Patient presents with  . Weakness       Brief Narrative 82 year old female with a history of pancreatic cancer status post Whipple, hypertension, A. fib previously on Xarelto, hypothyroidism with PE about 3 months back on anticoagulation presented with generalized weakness, dizziness and shortness of breath.  She was found to have elevated troponin (peaked at 0.42) and sinus tachycardia on EKG with ST elevation in inferior leads with T wave inversion in V4-V6.   Subjective:   Patient denies any chest pain or further shortness of breath but reports still feeling quite weak and has not got out of bed.   Assessment  & Plan :    Principal Problem: Acute coronary syndrome/nonischemic cardiomyopathy Troponin peaked at 0.42, now trending down.  No abnormality on telemetry past 24 hours.  2D echo with severely reduced EF of 25% with diffuse hypokinesis.  Suggestive of Takotsubo cardiomyopathy.  Continue Plavix (received loading dose on admission) and metoprolol.  Check lipid panel. Patient currently euvolemic.  Check strict I's/O and daily weight. Further recommendations per cardiology.  Active Problems: Transaminitis Chronic.  Slowly improving AST in a.m. lab.  Monitor closely.  Severe hypokalemia and hypomagnesemia Has chronic low potassium and magnesium seen on previous hospitalization.  Potassium replenished.  Magnesium  still quite low (1.2).  Being replenished. .  Generalized weakness Suspected due to dehydration, cardiomyopathy and elect light imbalance.  TSH normal.  Check B12.  PT evaluation once seen by cardiology and no intervention planned.  Adenocarcinoma of the head of the pancreas Status post Whipple's procedure in March 2019 currently on Xeloda.  History of PE in 02/2018 On Xarelto at home.  Currently on therapeutic Lovenox.   Code Status : Full code  Family Communication  : None at bedside  Disposition Plan  :  Home pending clinical improvement, cardiology and PT evaluation  Barriers For Discharge : Active symptoms  Consults  : Cardiology  Procedures  : 2D echo  DVT Prophylaxis  : Lovenox  Lab Results  Component Value Date   PLT 225 06/07/2018    Antibiotics  :    Anti-infectives (From admission, onward)   None        Objective:   Vitals:   06/07/18 0219 06/07/18 0407 06/07/18 0622 06/07/18 0830  BP: 94/61 90/63  99/66  Pulse: 91 84  83  Resp: 19 (!) 22  19  Temp: 99 F (37.2 C)   98.2 F (36.8 C)  TempSrc: Oral   Oral  SpO2: 97% 98%  98%  Weight:   68.5 kg   Height:        Wt Readings from Last 3 Encounters:  06/07/18 68.5 kg  05/23/18 67.9 kg  05/01/18 74 kg     Intake/Output Summary (Last 24 hours) at 06/07/2018 0856 Last data filed at 06/06/2018 1211 Gross per 24 hour  Intake 669.46 ml  Output 250 ml  Net 419.46 ml     Physical Exam  Gen: Appears fatigued, not in distress HEENT: no pallor, moist mucosa, supple neck Chest: clear b/l, no added sounds CVS: N S1&S2, no murmurs, rubs or gallop GI: soft, NT, ND, BS+ Musculoskeletal: warm, no edema     Data Review:    CBC Recent Labs  Lab 06/05/18 1933 06/06/18 1001 06/07/18 0308  WBC 12.5* 9.1 9.4  HGB 12.3 10.5* 10.5*  HCT 36.2 33.2* 31.7*  PLT 328 233 225  MCV 97.1 100.3* 98.1  MCH 33.0 31.7 32.5  MCHC 34.0 31.6 33.1  RDW 14.8 14.4 14.5  LYMPHSABS 2.4  --   --   MONOABS 1.4*   --   --   EOSABS 0.0  --   --   BASOSABS 0.0  --   --     Chemistries  Recent Labs  Lab 06/05/18 1933 06/06/18 1001 06/07/18 0308  NA 135 133* 134*  K 4.4 2.8* 4.8  CL 101 105 108  CO2 24 18* 19*  GLUCOSE 90 103* 105*  BUN 11 8 9   CREATININE 0.91 0.82 0.76  CALCIUM 8.7* 7.6* 8.0*  MG  --   --  1.2*  AST 123* 102* 80*  ALT 55* 44 40  ALKPHOS 181* 145* 149*  BILITOT 1.9* 1.8* 1.4*   ------------------------------------------------------------------------------------------------------------------ Recent Labs    06/06/18 0126  CHOL 198  HDL 15*  LDLCALC 159*  TRIG 122  CHOLHDL 13.2    Lab Results  Component Value Date   HGBA1C 4.5 (L) 12/30/2017   ------------------------------------------------------------------------------------------------------------------ Recent Labs    06/07/18 0308  TSH 1.020   ------------------------------------------------------------------------------------------------------------------ No results for input(s): VITAMINB12, FOLATE, FERRITIN, TIBC, IRON, RETICCTPCT in the last 72 hours.  Coagulation profile Recent Labs  Lab 06/05/18 1933  INR 1.13    Recent Labs    06/05/18 1933  DDIMER 2.06*    Cardiac Enzymes Recent Labs  Lab 06/06/18 0126 06/06/18 1001 06/07/18 0308  CKMB 2.8  --   --   TROPONINI 0.26* 0.16* 0.10*   ------------------------------------------------------------------------------------------------------------------ No results found for: BNP  Inpatient Medications  Scheduled Meds: . clopidogrel  75 mg Oral Daily  . enoxaparin (LOVENOX) injection  65 mg Subcutaneous Q12H  . feeding supplement (ENSURE ENLIVE)  237 mL Oral TID BM  . gabapentin  200 mg Oral BID  . metoprolol tartrate  25 mg Oral  BID  . mirtazapine  7.5 mg Oral QHS   Continuous Infusions: PRN Meds:.acetaminophen **OR** [DISCONTINUED] acetaminophen, lidocaine-prilocaine, ondansetron (ZOFRAN) IV, oxyCODONE, pantoprazole,  prochlorperazine, sodium chloride flush, traMADol  Micro Results No results found for this or any previous visit (from the past 240 hour(s)).  Radiology Reports Dg Chest 2 View  Result Date: 06/05/2018 CLINICAL DATA:  82 year old female with generalized weakness EXAM: CHEST - 2 VIEW COMPARISON:  Prior chest x-ray 04/11/2018 FINDINGS: Stable cardiac and mediastinal contours. A left subclavian approach single-lumen power injectable portacatheter is present. The tip of the catheter has retracted some and now overlies the left innominate vein. No evidence of focal airspace consolidation, infiltrate, pleural effusion or pneumothorax. Inspiratory volumes are slightly low. Chronic bronchitic changes appear similar compared to prior. No acute osseous abnormality. IMPRESSION: 1. No acute cardiopulmonary process. 2. The tip of the left IJ approach single-lumen power injectable port catheter has retracted slightly and now lies within the left innominate vein. Electronically Signed   By: Jacqulynn Cadet M.D.   On: 06/05/2018 15:32   Ct Head Wo Contrast  Result Date: 06/05/2018 CLINICAL DATA:  Muscle weakness EXAM: CT HEAD WITHOUT CONTRAST TECHNIQUE: Contiguous axial images were obtained from the base of the skull through the vertex without intravenous contrast. COMPARISON:  Head CT 04/22/2018 FINDINGS: Brain: There is no mass, hemorrhage or extra-axial collection. There is mild generalized atrophy without lobar predilection. There is no acute or chronic infarction. The brain parenchyma is normal. Vascular: No abnormal hyperdensity of the major intracranial arteries or dural venous sinuses. No intracranial atherosclerosis. Skull: The visualized skull base, calvarium and extracranial soft tissues are normal. Sinuses/Orbits: No fluid levels or advanced mucosal thickening of the visualized paranasal sinuses. No mastoid or middle ear effusion. The orbits are normal. IMPRESSION: Mild parenchymal volume loss without  acute intracranial abnormality. Electronically Signed   By: Ulyses Jarred M.D.   On: 06/05/2018 21:55   Ct Angio Chest Pe W Or Wo Contrast  Result Date: 06/06/2018 CLINICAL DATA:  82 year old female with concern for pulmonary embolism. History of pancreatic cancer status post prior Whipple procedure. EXAM: CT ANGIOGRAPHY CHEST WITH CONTRAST TECHNIQUE: Multidetector CT imaging of the chest was performed using the standard protocol during bolus administration of intravenous contrast. Multiplanar CT image reconstructions and MIPs were obtained to evaluate the vascular anatomy. CONTRAST:  138mL ISOVUE-370 IOPAMIDOL (ISOVUE-370) INJECTION 76% COMPARISON:  Chest CT dated 03/09/2018 and chest radiograph dated 06/05/2018 FINDINGS: Cardiovascular: There is no cardiomegaly or pericardial effusion. Coronary vascular calcification. Mild atherosclerotic calcification of the thoracic aorta. Linear band in the right lower lobe pulmonary artery as well as small band like density in the subsegmental right lower lobe branch consistent with chronic thrombus/scarring and similar to prior CT. No CT evidence of acute pulmonary artery embolus. Mediastinum/Nodes: No hilar or mediastinal adenopathy. Esophagus and the thyroid gland are grossly unremarkable. No mediastinal fluid collection. Lungs/Pleura: There are bibasilar dependent atelectatic changes primarily involving the lung bases. No focal consolidation, pleural effusion, or pneumothorax. The central airways are patent. Upper Abdomen: Advanced fatty infiltration of the liver. Multiple hepatic lesions again noted and previously described as metastatic disease. Musculoskeletal: No chest wall abnormality. No acute or significant osseous findings. Review of the MIP images confirms the above findings. IMPRESSION: 1. No acute intrathoracic pathology. No CT evidence of acute pulmonary artery embolus. 2. Severe fatty infiltration of the liver with multiple liver lesions. Electronically  Signed   By: Anner Crete M.D.   On: 06/06/2018 03:37  Time Spent in minutes  35   Carsen Machi M.D on 06/07/2018 at 8:56 AM  Between 7am to 7pm - Pager - [phone]  After 7pm go to [email or web address] - password [facility]  [facility] -  Office  [phone]

## 2018-06-08 DIAGNOSIS — R000 Tachycardia, unspecified: Secondary | ICD-10-CM

## 2018-06-08 DIAGNOSIS — I428 Other cardiomyopathies: Secondary | ICD-10-CM

## 2018-06-08 LAB — COMPREHENSIVE METABOLIC PANEL
ALBUMIN: 1.8 g/dL — AB (ref 3.5–5.0)
ALT: 36 U/L (ref 0–44)
ANION GAP: 7 (ref 5–15)
AST: 70 U/L — ABNORMAL HIGH (ref 15–41)
Alkaline Phosphatase: 161 U/L — ABNORMAL HIGH (ref 38–126)
BILIRUBIN TOTAL: 1.1 mg/dL (ref 0.3–1.2)
BUN: 8 mg/dL (ref 8–23)
CHLORIDE: 106 mmol/L (ref 98–111)
CO2: 20 mmol/L — ABNORMAL LOW (ref 22–32)
Calcium: 8 mg/dL — ABNORMAL LOW (ref 8.9–10.3)
Creatinine, Ser: 0.8 mg/dL (ref 0.44–1.00)
GFR calc Af Amer: >60 mL/min (ref >60)
GFR calc non Af Amer: >60 mL/min (ref >60)
GLUCOSE: 87 mg/dL (ref 70–99)
POTASSIUM: 3.7 mmol/L (ref 3.5–5.1)
Sodium: 133 mmol/L — ABNORMAL LOW (ref 135–145)
TOTAL PROTEIN: 7 g/dL (ref 6.5–8.1)

## 2018-06-08 LAB — CBC
HCT: 33.8 % — ABNORMAL LOW (ref 36.0–46.0)
Hemoglobin: 10.9 g/dL — ABNORMAL LOW (ref 12.0–15.0)
MCH: 32.2 pg (ref 26.0–34.0)
MCHC: 32.2 g/dL (ref 30.0–36.0)
MCV: 100 fL (ref 78.0–100.0)
Platelets: 248 10*3/uL (ref 150–400)
RBC: 3.38 MIL/uL — AB (ref 3.87–5.11)
RDW: 14.7 % (ref 11.5–15.5)
WBC: 9 10*3/uL (ref 4.0–10.5)

## 2018-06-08 LAB — MAGNESIUM: MAGNESIUM: 2 mg/dL (ref 1.7–2.4)

## 2018-06-08 LAB — VITAMIN B12: VITAMIN B 12: 1913 pg/mL — AB (ref 180–914)

## 2018-06-08 MED ORDER — RIVAROXABAN 20 MG PO TABS
20.0000 mg | ORAL_TABLET | Freq: Every day | ORAL | Status: DC
  Administered 2018-06-08 – 2018-06-10 (×3): 20 mg via ORAL
  Filled 2018-06-08 (×3): qty 1

## 2018-06-08 NOTE — Evaluation (Signed)
Physical Therapy Evaluation Patient Details Name: Lauren Richard MRN: [id number] DOB: [birth year] Today's Date: 06/08/2018   History of Present Illness  Pt is an 82 y/o female with PMH including but not limited to hypothyroidism, hypertension, pancreatic cancer s/p whipple (01/05/18) admitted secondary to c/o generalized weakness, dizziness and dyspnea. Cardiology was consulted and pt found to have an abnormal EKG and acute on chronic CHF with no aggressive treatment per cardiology at this time.    Clinical Impression  Pt presented supine in bed with HOB elevated, awake and willing to participate in therapy session. Prior to admission, pt reported that she was ambulating in her home with RW, receiving HHPT 2x/week and had recently began to need assistance from niece with ADLs and IADLs. Pt lives alone in a second floor apartment with a full flight of stairs to enter (no elevator). Pt is currently very limited secondary to pain and weakness. She requires mod A with bed mobility and mod-max A for transfers. Pt would greatly benefit from further intensive therapy services at a SNF prior to returning home alone to her second floor apartment.   Pt would continue to benefit from skilled physical therapy services at this time while admitted and after d/c to address the below listed limitations in order to improve overall safety and independence with functional mobility.     Follow Up Recommendations SNF;Supervision/Assistance - 24 hour    Equipment Recommendations  None recommended by PT    Recommendations for Other Services       Precautions / Restrictions Precautions Precautions: Fall Restrictions Weight Bearing Restrictions: No      Mobility  Bed Mobility Overal bed mobility: Needs Assistance Bed Mobility: Supine to Sit     Supine to sit: Mod assist     General bed mobility comments: increased time and effort, assist with bilateral LE movement off of bed, HOB elevated, use of bed  rails  Transfers Overall transfer level: Needs assistance Equipment used: None;Rolling walker (2 wheeled) Transfers: Sit to/from [facility] Sit to Stand: Max assist Stand pivot transfers: Mod assist       General transfer comment: increased time and effort, cueing for safety, pt performing initial sit to stand from EOB and pivot to [facility] without use of RW despite therapist's encouragement and cueing for safety; pt required heavy physical assistance (max A) to achieve full upright standing from EOB x1 and from [facility] x2  Ambulation/Gait             General Gait Details: unable to tolerate at this time  Stairs            Wheelchair Mobility    Modified Rankin (Stroke Patients Only)       Balance Overall balance assessment: Needs assistance Sitting-balance support: Feet supported Sitting balance-Leahy Scale: Good     Standing balance support: During functional activity;Bilateral upper extremity supported Standing balance-Leahy Scale: Poor Standing balance comment: mod-max A                             Pertinent Vitals/Pain Pain Assessment: Faces Faces Pain Scale: Hurts little more Pain Location: bilateral LEs; "my bones are sore" Pain Descriptors / Indicators: Sore Pain Intervention(s): Monitored during session;Repositioned    Home Living Family/patient expects to be discharged to:: Private residence Living Arrangements: Alone Available Help at Discharge: Family;Available PRN/intermittently Type of Home: Apartment Home Access: Stairs to enter Entrance Stairs-Rails: Right;Left Entrance Stairs-Number of Steps: full flight  Home Layout: One level Home Equipment: Walker - 2 wheels;Wheelchair - [employer];Shower seat      Prior Function Level of Independence: Needs assistance   Gait / Transfers Assistance Needed: pt reported that she was previously able to ambulate with RW  ADL's / Homemaking Assistance Needed: recently  needing assistance with bathing/dressing from neice        Hand Dominance        Extremity/Trunk Assessment   Upper Extremity Assessment Upper Extremity Assessment: Generalized weakness    Lower Extremity Assessment Lower Extremity Assessment: Generalized weakness       Communication      Cognition Arousal/Alertness: Awake/alert Behavior During Therapy: WFL for tasks assessed/performed Overall Cognitive Status: Impaired/Different from baseline Area of Impairment: Memory;Following commands;Safety/judgement;Problem solving                     Memory: Decreased short-term memory Following Commands: Follows one step commands with increased time Safety/Judgement: Decreased awareness of deficits;Decreased awareness of safety   Problem Solving: Difficulty sequencing;Requires verbal cues        General Comments      Exercises     Assessment/Plan    PT Assessment Patient needs continued PT services  PT Problem List Decreased strength;Decreased range of motion;Decreased activity tolerance;Decreased balance;Decreased mobility;Decreased coordination;Decreased cognition;Decreased knowledge of use of DME;Decreased safety awareness;Decreased knowledge of precautions;Cardiopulmonary status limiting activity;Pain       PT Treatment Interventions DME instruction;Gait training;Functional mobility training;Therapeutic activities;Stair training;Therapeutic exercise;Balance training;Neuromuscular re-education;Patient/family education    PT Goals (Current goals can be found in the Care Plan section)  Acute Rehab PT Goals Patient Stated Goal: decrease pain PT Goal Formulation: With patient Time For Goal Achievement: 06/22/18 Potential to Achieve Goals: Fair    Frequency Min 3X/week   Barriers to discharge        Co-evaluation               AM-PAC PT "6 Clicks" Daily Activity  Outcome Measure Difficulty turning over in bed (including adjusting bedclothes,  sheets and blankets)?: Unable Difficulty moving from lying on back to sitting on the side of the bed? : Unable Difficulty sitting down on and standing up from a chair with arms (e.g., wheelchair, bedside commode, etc,.)?: Unable Help needed moving to and from a bed to chair (including a wheelchair)?: A Lot Help needed walking in hospital room?: Total Help needed climbing 3-5 steps with a railing? : Total 6 Click Score: 7    End of Session Equipment Utilized During Treatment: Gait belt Activity Tolerance: Patient limited by pain;Patient limited by fatigue Patient left: in bed;with call bell/phone within reach;Other (comment)(sitting EOB to eat lunch) Nurse Communication: Mobility status PT Visit Diagnosis: Other abnormalities of gait and mobility (R26.89);Muscle weakness (generalized) (M62.81);Pain Pain - Right/Left: (bilateral) Pain - part of body: Leg    Time: 1217-1243 PT Time Calculation (min) (ACUTE ONLY): 26 min   Charges:   PT Evaluation $PT Eval Moderate Complexity: 1 Mod PT Treatments $Therapeutic Activity: 8-22 mins        Sherie Don, PT, DPT  Acute Rehabilitation Services Pager [phone] Office [place] 06/08/2018, 1:15 PM

## 2018-06-08 NOTE — Progress Notes (Addendum)
Progress Note  Patient Name: Lauren Richard Date of Encounter: 06/08/2018  Primary Cardiologist: Skeet Latch, MD   Subjective   General chest soreness, mild increase with deep breath.  She also has back pain.   Inpatient Medications    Scheduled Meds: . clopidogrel  75 mg Oral Daily  . enoxaparin (LOVENOX) injection  65 mg Subcutaneous Q12H  . feeding supplement (ENSURE ENLIVE)  237 mL Oral TID BM  . gabapentin  200 mg Oral BID  . metoprolol tartrate  12.5 mg Oral BID  . mirtazapine  7.5 mg Oral QHS   Continuous Infusions:  PRN Meds: acetaminophen **OR** [DISCONTINUED] acetaminophen, lidocaine-prilocaine, ondansetron (ZOFRAN) IV, oxyCODONE, pantoprazole, prochlorperazine, sodium chloride flush, traMADol   Vital Signs    Vitals:   06/07/18 2345 06/08/18 0429 06/08/18 0844 06/08/18 0952  BP:  98/65 96/65 95/76   Pulse:  91    Resp: (!) 23 17 18 15   Temp:  98 F (36.7 C) 98.5 F (36.9 C)   TempSrc:  Oral Oral   SpO2:  100%    Weight:  68 kg    Height:        Intake/Output Summary (Last 24 hours) at 06/08/2018 1003 Last data filed at 06/08/2018 0519 Gross per 24 hour  Intake 771.93 ml  Output 250 ml  Net 521.93 ml   Filed Weights   06/06/18 0500 06/07/18 0622 06/08/18 0429  Weight: 65.5 kg 68.5 kg 68 kg    Telemetry    SR to ST with occ WCT of 3 beats - Personally Reviewed  ECG    No new - Personally Reviewed  Physical Exam   GEN: No acute distress.   Neck: No JVD Cardiac: RRR, no murmurs, rubs, or gallops.  Respiratory: Clear to auscultation bilaterally. GI: Soft, nontender, non-distended  MS: No edema; No deformity. Neuro:  Nonfocal  Psych: Normal affect   Labs    Chemistry Recent Labs  Lab 06/06/18 1001 06/07/18 0308 06/08/18 0459  NA 133* 134* 133*  K 2.8* 4.8 3.7  CL 105 108 106  CO2 18* 19* 20*  GLUCOSE 103* 105* 87  BUN 8 9 8   CREATININE 0.82 0.76 0.80  CALCIUM 7.6* 8.0* 8.0*  PROT 6.3* 6.5 7.0  ALBUMIN 1.7* 1.8* 1.8*    AST 102* 80* 70*  ALT 44 40 36  ALKPHOS 145* 149* 161*  BILITOT 1.8* 1.4* 1.1  GFRNONAA >60 >60 >60  GFRAA >60 >60 >60  ANIONGAP 10 7 7      Hematology Recent Labs  Lab 06/06/18 1001 06/07/18 0308 06/08/18 0459  WBC 9.1 9.4 9.0  RBC 3.31* 3.23* 3.38*  HGB 10.5* 10.5* 10.9*  HCT 33.2* 31.7* 33.8*  MCV 100.3* 98.1 100.0  MCH 31.7 32.5 32.2  MCHC 31.6 33.1 32.2  RDW 14.4 14.5 14.7  PLT 233 225 248    Cardiac Enzymes Recent Labs  Lab 06/06/18 0126 06/06/18 1001 06/07/18 0308  TROPONINI 0.26* 0.16* 0.10*    Recent Labs  Lab 06/05/18 1715  TROPIPOC 0.42*     BNPNo results for input(s): BNP, PROBNP in the last 168 hours.   DDimer  Recent Labs  Lab 06/05/18 1933  DDIMER 2.06*     Radiology    No results found.  Cardiac Studies   Echo 06/06/18 Study Conclusions  - Left ventricle: The cavity size was normal. Wall thickness was   normal. Mid to apical anteroseptal akinesis, mid to apical   inferoseptal akinesis, mid to apical anterolateral akinesis, mid  to apical anterior akinesis, mid to apical inferior akinesis, mid   to apical inferolateral akinesis, akinesis of the true apex. The   estimated ejection fraction was 25%. - Right ventricle: The cavity size was normal. Systolic function   was normal. - Tricuspid valve: Peak RV-RA gradient (S): 29 mm Hg. - Inferior vena cava: The vessel was normal in size. The   respirophasic diameter changes were in the normal range (>= 50%),   consistent with normal central venous pressure. - Pericardium, extracardiac: A trivial pericardial effusion was   identified posterior to the heart.  Impressions:  - Limited echo for LV function. EF 25%. Wall motion abnormalities   with preserved basal LV segments and akinesis of all the mid to   apical segments. This is suggestive of stress (Takotsubo)   cardiomyopathy but cannot rule out infarction of very large   wrap-around LAD.   Echo 04/23/18  Study  Conclusions  - Left ventricle: The cavity size was normal. Wall thickness was   normal. Systolic function was normal. The estimated ejection   fraction was in the range of 55% to 60%. Wall motion was normal;   there were no regional wall motion abnormalities. Left   ventricular diastolic function parameters were normal. - Aortic valve: Mildly calcified annulus. Trileaflet. There was   mild regurgitation. - Mitral valve: There was trivial regurgitation. - Right atrium: Central venous pressure (est): 3 mm Hg. - Tricuspid valve: There was mild regurgitation. - Pulmonary arteries: PA peak pressure: 36 mm Hg (S). - Pericardium, extracardiac: There was no pericardial effusion.   Patient Profile     82 y.o. female has hx of metastatic pancreatic cancer on oral chemo, with no significant cardiac hx. Presented with weakness, dizziness and dyspnea 06/05/18  EKG with abnormal EKG.  + troponins.     Assessment & Plan    Acute on chronic systolic CHF  With new decrease since July of EF. Possible Takotsubo vs. prox LAD lesion.  No chest pain.   -- no plan for aggressive approach.  --BB, ASA  -- positive 1661 and wt stable, one wt is decrease unsure how accurate.     Abnormal EKG with elevated troponins  -- CTA of chest neg PE. See above. --poc of 0.42, Troponin I 0.26; 0.16; 0.10   CK MB neg.  --some chest soreness not sure if related to her chronic back pain or new.    Pancreatic cancer.  Per oncology   HLD  LDL is 159  On admit would add statin.    Pt is full code.        For questions or updates, please contact [place] Please consult [email or web address] for contact info under Cardiology/STEMI.      Signed, Cecilie Kicks, NP  06/08/2018, 10:03 AM     PT seen and examined   I agree with findings of L [name] above  Pt napping when I got to room  Woke easliy but not talkative  She is comfortable lying   Lungs are rel clear   Cardiac RRR  No S3   Abd is supple   Ext are without  signif edema  BP marginal  Does not allow advancement of meds   I again favor conservative Rx     Family not around when I was in room   I did speak with Niece on phone yesterday to relay pt's hosp course.  I also discussed Code Status   For now she remains full code.  Dorris Carnes

## 2018-06-08 NOTE — Clinical Social Work Note (Signed)
Clinical Social Work Assessment  Patient Details  Name: Lauren Richard MRN: [id number] Date of Birth: 09/11/1936  Date of referral:  06/08/18               Reason for consult:  Family Concerns, Facility Placement                Permission sought to share information with:  Family Supports Permission granted to share information::  Yes, Verbal Permission Granted  Name::     Allene Pyo  Agency::     Relationship::  niece  Contact Information:  [phone]  Housing/Transportation Living arrangements for the past 2 months:  [place] of Information:  Patient Patient Interpreter Needed:  None Criminal Activity/Legal Involvement Pertinent to Current Situation/Hospitalization:  No - Comment as needed Significant Relationships:  Other Family Members, Siblings Lives with:  Self Do you feel safe going back to the place where you live?  Yes Need for family participation in patient care:  Yes (Comment)  Care giving concerns:  No family at bedside. Patient stated she lives by herself but does have support from family in the surrounding area.   Social Worker assessment / plan:  CSW following met patent at bedside to discuss discharge need. Patient stated she has been at a facility in the past and is aware of the process. Patent stated she would like to go to rehab prior to going back home. CSW to follow up with patient once bed offers are available   Employment status:  Retired Forensic scientist:  Medicare PT Recommendations:  [place] / Referral to community resources:  [place]  Patient/Family's Response to care:  Patient stated she appreciate CSW role in care  Patient/Family's Understanding of and Emotional Response to Diagnosis, Current Treatment, and Prognosis:  Patient is agreeable with discharge plan to a SNF  Emotional Assessment Appearance:  Appears older than stated age Attitude/Demeanor/Rapport:   Engaged Affect (typically observed):  Accepting Orientation:  Oriented to Self, Oriented to Situation, Oriented to Place, Oriented to  Time Alcohol / Substance use:  Not Applicable Psych involvement (Current and /or in the community):  No (Comment)  Discharge Needs  Concerns to be addressed:  Care Coordination Readmission within the last 30 days:  No Current discharge risk:  Dependent with Mobility Barriers to Discharge:  Continued Medical Work up   [employer], LCSW 06/08/2018, 4:18 PM

## 2018-06-08 NOTE — Progress Notes (Addendum)
PROGRESS NOTE                                                                                                                                                                                                             Patient Demographics:    Lauren Richard, is a 82 y.o. female, DOB - 10-17-1935, IFO:[id number]  Admit date - 06/05/2018   Admitting Physician Jani Gravel, MD  Outpatient Primary MD for the patient is Lauren Chard, MD  LOS - 2  Outpatient Specialists: Oncology ( Dr Burr Medico)  Chief Complaint  Patient presents with  . Weakness       Brief Narrative 82 year old female with a history of pancreatic cancer status post Whipple, hypertension, A. fib previously on Xarelto, hypothyroidism with PE about 3 months back on anticoagulation presented with generalized weakness, dizziness and shortness of breath.  She was found to have elevated troponin (peaked at 0.42) and sinus tachycardia on EKG with ST elevation in inferior leads with T wave inversion in V4-V6.   Subjective:   Still feels very weak. Patient sleepy and reports feeling dizzy on getting up   Assessment  & Plan :    Principal Problem: Acute coronary syndrome/nonischemic cardiomyopathy Troponin peaked at 0.42, now trending down.stable on tele.   2D echo with severely reduced EF of 25% with diffuse hypokinesis.  Suggestive of Takotsubo cardiomyopathy.  Continue Plavix (received loading dose on admission) and metoprolol. LDL 119. On low dose BB.  Patient currently euvolemic.  Check strict I's/O and daily weight. Cariology recommends medical management without intervention.  D/c telemetry.  Active Problems: Transaminitis Chronic.  Slowly improving..  Monitor closely.  Severe hypokalemia and hypomagnesemia Has chronic low potassium and magnesium seen on previous hospitalization.  Potassium replenished.  Magnesium still quite low (1.2).  Being  replenished. .  Generalized weakness Suspected due to dehydration, cardiomyopathy and elect light imbalance.  TSH normal.  Check B12.  Check orthostasis. PT recommends SNF.   Adenocarcinoma of the head of the pancreas Status post Whipple's procedure in March 2019 currently on Xeloda.  History of PE in 02/2018 On Xarelto at home.  Switch lovenox to xarelto   Code Status : Full code  Family Communication  : None at bedside  Disposition Plan  : SNF per pt. SW consulted.  Barriers For Discharge : improving symptoms  Consults  :  Cardiology  Procedures  : 2D echo  DVT Prophylaxis  : switch to xarelto  Lab Results  Component Value Date   PLT 248 06/08/2018    Antibiotics  :    Anti-infectives (From admission, onward)   None        Objective:   Vitals:   06/07/18 2345 06/08/18 0429 06/08/18 0844 06/08/18 0952  BP:  98/65 96/65 95/76   Pulse:  91    Resp: (!) 23 17 18 15   Temp:  98 F (36.7 C) 98.5 F (36.9 C)   TempSrc:  Oral Oral   SpO2:  100%    Weight:  68 kg    Height:        Wt Readings from Last 3 Encounters:  06/08/18 68 kg  05/23/18 67.9 kg  05/01/18 74 kg     Intake/Output Summary (Last 24 hours) at 06/08/2018 1346 Last data filed at 06/08/2018 0519 Gross per 24 hour  Intake 571.93 ml  Output 250 ml  Net 321.93 ml   Physical exam  NAD, fatigued  HEENT: moist mucosa, supple neck Chest: clear b/l, no added sounds CVS: NS1&S2, no murmurs GI: soft, NT, ND BS+ Musculoskeletal: warm, no edema       Data Review:    CBC Recent Labs  Lab 06/05/18 1933 06/06/18 1001 06/07/18 0308 06/08/18 0459  WBC 12.5* 9.1 9.4 9.0  HGB 12.3 10.5* 10.5* 10.9*  HCT 36.2 33.2* 31.7* 33.8*  PLT 328 233 225 248  MCV 97.1 100.3* 98.1 100.0  MCH 33.0 31.7 32.5 32.2  MCHC 34.0 31.6 33.1 32.2  RDW 14.8 14.4 14.5 14.7  LYMPHSABS 2.4  --   --   --   MONOABS 1.4*  --   --   --   EOSABS 0.0  --   --   --   BASOSABS 0.0  --   --   --     Chemistries   Recent Labs  Lab 06/05/18 1933 06/06/18 1001 06/07/18 0308 06/08/18 0459  NA 135 133* 134* 133*  K 4.4 2.8* 4.8 3.7  CL 101 105 108 106  CO2 24 18* 19* 20*  GLUCOSE 90 103* 105* 87  BUN 11 8 9 8   CREATININE 0.91 0.82 0.76 0.80  CALCIUM 8.7* 7.6* 8.0* 8.0*  MG  --   --  1.2* 2.0  AST 123* 102* 80* 70*  ALT 55* 44 40 36  ALKPHOS 181* 145* 149* 161*  BILITOT 1.9* 1.8* 1.4* 1.1   ------------------------------------------------------------------------------------------------------------------ Recent Labs    06/06/18 0126 06/07/18 0308  CHOL 198 151  HDL 15* 12*  LDLCALC 159* 119*  TRIG 122 100  CHOLHDL 13.2 12.6    Lab Results  Component Value Date   HGBA1C 4.5 (L) 12/30/2017   ------------------------------------------------------------------------------------------------------------------ Recent Labs    06/07/18 0308  TSH 1.020   ------------------------------------------------------------------------------------------------------------------ No results for input(s): VITAMINB12, FOLATE, FERRITIN, TIBC, IRON, RETICCTPCT in the last 72 hours.  Coagulation profile Recent Labs  Lab 06/05/18 1933  INR 1.13    Recent Labs    06/05/18 1933  DDIMER 2.06*    Cardiac Enzymes Recent Labs  Lab 06/06/18 0126 06/06/18 1001 06/07/18 0308  CKMB 2.8  --   --   TROPONINI 0.26* 0.16* 0.10*   ------------------------------------------------------------------------------------------------------------------ No results found for: BNP  Inpatient Medications  Scheduled Meds: . clopidogrel  75 mg Oral Daily  . enoxaparin (LOVENOX) injection  65 mg Subcutaneous Q12H  . feeding supplement (ENSURE ENLIVE)  237 mL Oral  TID BM  . gabapentin  200 mg Oral BID  . metoprolol tartrate  12.5 mg Oral BID  . mirtazapine  7.5 mg Oral QHS   Continuous Infusions: PRN Meds:.acetaminophen **OR** [DISCONTINUED] acetaminophen, lidocaine-prilocaine, ondansetron (ZOFRAN) IV,  oxyCODONE, pantoprazole, prochlorperazine, sodium chloride flush, traMADol  Micro Results No results found for this or any previous visit (from the past 240 hour(s)).  Radiology Reports Dg Chest 2 View  Result Date: 06/05/2018 CLINICAL DATA:  82 year old female with generalized weakness EXAM: CHEST - 2 VIEW COMPARISON:  Prior chest x-ray 04/11/2018 FINDINGS: Stable cardiac and mediastinal contours. A left subclavian approach single-lumen power injectable portacatheter is present. The tip of the catheter has retracted some and now overlies the left innominate vein. No evidence of focal airspace consolidation, infiltrate, pleural effusion or pneumothorax. Inspiratory volumes are slightly low. Chronic bronchitic changes appear similar compared to prior. No acute osseous abnormality. IMPRESSION: 1. No acute cardiopulmonary process. 2. The tip of the left IJ approach single-lumen power injectable port catheter has retracted slightly and now lies within the left innominate vein. Electronically Signed   By: Jacqulynn Cadet M.D.   On: 06/05/2018 15:32   Ct Head Wo Contrast  Result Date: 06/05/2018 CLINICAL DATA:  Muscle weakness EXAM: CT HEAD WITHOUT CONTRAST TECHNIQUE: Contiguous axial images were obtained from the base of the skull through the vertex without intravenous contrast. COMPARISON:  Head CT 04/22/2018 FINDINGS: Brain: There is no mass, hemorrhage or extra-axial collection. There is mild generalized atrophy without lobar predilection. There is no acute or chronic infarction. The brain parenchyma is normal. Vascular: No abnormal hyperdensity of the major intracranial arteries or dural venous sinuses. No intracranial atherosclerosis. Skull: The visualized skull base, calvarium and extracranial soft tissues are normal. Sinuses/Orbits: No fluid levels or advanced mucosal thickening of the visualized paranasal sinuses. No mastoid or middle ear effusion. The orbits are normal. IMPRESSION: Mild parenchymal  volume loss without acute intracranial abnormality. Electronically Signed   By: Ulyses Jarred M.D.   On: 06/05/2018 21:55   Ct Angio Chest Pe W Or Wo Contrast  Result Date: 06/06/2018 CLINICAL DATA:  82 year old female with concern for pulmonary embolism. History of pancreatic cancer status post prior Whipple procedure. EXAM: CT ANGIOGRAPHY CHEST WITH CONTRAST TECHNIQUE: Multidetector CT imaging of the chest was performed using the standard protocol during bolus administration of intravenous contrast. Multiplanar CT image reconstructions and MIPs were obtained to evaluate the vascular anatomy. CONTRAST:  141mL ISOVUE-370 IOPAMIDOL (ISOVUE-370) INJECTION 76% COMPARISON:  Chest CT dated 03/09/2018 and chest radiograph dated 06/05/2018 FINDINGS: Cardiovascular: There is no cardiomegaly or pericardial effusion. Coronary vascular calcification. Mild atherosclerotic calcification of the thoracic aorta. Linear band in the right lower lobe pulmonary artery as well as small band like density in the subsegmental right lower lobe branch consistent with chronic thrombus/scarring and similar to prior CT. No CT evidence of acute pulmonary artery embolus. Mediastinum/Nodes: No hilar or mediastinal adenopathy. Esophagus and the thyroid gland are grossly unremarkable. No mediastinal fluid collection. Lungs/Pleura: There are bibasilar dependent atelectatic changes primarily involving the lung bases. No focal consolidation, pleural effusion, or pneumothorax. The central airways are patent. Upper Abdomen: Advanced fatty infiltration of the liver. Multiple hepatic lesions again noted and previously described as metastatic disease. Musculoskeletal: No chest wall abnormality. No acute or significant osseous findings. Review of the MIP images confirms the above findings. IMPRESSION: 1. No acute intrathoracic pathology. No CT evidence of acute pulmonary artery embolus. 2. Severe fatty infiltration of the liver with multiple liver  lesions. Electronically Signed   By: Anner Crete M.D.   On: 06/06/2018 03:37    Time Spent in minutes 25   Ellenor Wisniewski M.D on 06/08/2018 at 1:46 PM  Between 7am to 7pm - Pager - [phone]  After 7pm go to [email or web address] - password [facility]  [facility] -  Office  [phone]

## 2018-06-08 NOTE — NC FL2 (Signed)
[place] LEVEL OF CARE SCREENING TOOL     IDENTIFICATION  Patient Name: Lauren Richard Birthdate: [birth year] Sex: female Admission Date (Current Location): 06/05/2018  [facility] and [place] Number:  Herbalist and Address:  [facility] [place], [place] [address] [place], [place] [address]      Provider Number: [id number]  Attending Physician Name and Address:  Louellen Molder, MD  Relative Name and Phone Number:       Current Level of Care: Hospital Recommended Level of Care: [place] Prior Approval Number:    Date Approved/Denied:   PASRR Number: [id number]  Discharge Plan: SNF    Current Diagnoses: Patient Active Problem List   Diagnosis Date Noted  . Tachycardia 06/05/2018  . Elevated troponin 06/05/2018  . Dyspnea 06/05/2018  . Abnormal liver function 06/05/2018  . Genetic testing 05/05/2018  . Malnutrition of moderate degree 04/25/2018  . Malignant neoplasm of head of pancreas ([place])   . Heme positive stool   . Syncope 04/23/2018  . Syncope, near 04/22/2018  . Pancreatic cancer metastasized to liver ([place]) 04/22/2018  . Generalized weakness 03/29/2018  . Nausea & vomiting 03/29/2018  . Diarrhea 03/29/2018  . Chemotherapy induced neutropenia ([place]) 03/23/2018  . Family history of pancreatic cancer   . Family history of breast cancer   . Family history of kidney cancer   . Family history of prostate cancer   . Goals of care, counseling/discussion 03/09/2018  . Acute pulmonary embolism ([place]) 03/09/2018  . S/P laparoscopy 01/05/2018  . Adenocarcinoma of head of pancreas ([place]) 11/24/2017  . Acute kidney injury superimposed on chronic kidney disease ([place])   . Hypokalemia   . Anemia   . Biliary obstruction   . Acute kidney injury ([place]) 11/03/2017  . Pancreatic mass 11/03/2017  . Osteoarthritis of left knee 04/26/2017  . S/P total knee replacement using cement, left 04/26/2017  . Essential hypertension  03/03/2017  . Hyperlipidemia 03/03/2017  . Osteoarthritis 03/03/2017  . Obesity 03/03/2017  . Status post left foot surgery 12/03/2015  . Hammertoe 11/03/2015    Orientation RESPIRATION BLADDER Height & Weight     Self, Time, Situation, Place  Normal Continent, External catheter Weight: 149 lb 14.4 oz (68 kg) Height:  5\' 7"  (170.2 cm)  BEHAVIORAL SYMPTOMS/MOOD NEUROLOGICAL BOWEL NUTRITION STATUS  (None) (None) Continent Diet(Regular)  AMBULATORY STATUS COMMUNICATION OF NEEDS Skin   Extensive Assist Verbally Normal                       Personal Care Assistance Level of Assistance  Bathing, Feeding, Dressing Bathing Assistance: Limited assistance Feeding assistance: Limited assistance Dressing Assistance: Limited assistance     Functional Limitations Info  Sight, Hearing, Speech Sight Info: Adequate Hearing Info: Adequate Speech Info: Adequate    SPECIAL CARE FACTORS FREQUENCY  PT (By licensed PT), OT (By licensed OT)     PT Frequency: 5 x week OT Frequency: 5 x week            Contractures Contractures Info: Not present    Additional Factors Info  Code Status, Allergies Code Status Info: Full code Allergies Info: NKDA           Current Medications (06/08/2018):  This is the current hospital active medication list Current Facility-Administered Medications  Medication Dose Route Frequency Provider Last Rate Last Dose  . acetaminophen (TYLENOL) tablet 650 mg  650 mg Oral Q6H PRN Cherene Altes, MD      .  clopidogrel (PLAVIX) tablet 75 mg  75 mg Oral Daily Jani Gravel, MD   75 mg at 06/08/18 0954  . feeding supplement (ENSURE ENLIVE) (ENSURE ENLIVE) liquid 237 mL  237 mL Oral TID BM Jani Gravel, MD   237 mL at 06/08/18 1422  . gabapentin (NEURONTIN) capsule 200 mg  200 mg Oral BID Jani Gravel, MD   200 mg at 06/08/18 0954  . lidocaine-prilocaine (EMLA) cream 1 application  1 application Topical PRN Jani Gravel, MD      . metoprolol tartrate (LOPRESSOR)  tablet 12.5 mg  12.5 mg Oral BID Fay Records, MD   12.5 mg at 06/07/18 2109  . mirtazapine (REMERON) tablet 7.5 mg  7.5 mg Oral QHS Jani Gravel, MD   7.5 mg at 06/07/18 2110  . ondansetron (ZOFRAN) injection 4 mg  4 mg Intravenous Q6H PRN Jani Gravel, MD      . oxyCODONE (Oxy IR/ROXICODONE) immediate release tablet 5-10 mg  5-10 mg Oral Q4H PRN Cherene Altes, MD   5 mg at 06/07/18 2345  . pantoprazole (PROTONIX) EC tablet 40 mg  40 mg Oral Daily PRN Jani Gravel, MD      . prochlorperazine (COMPAZINE) tablet 10 mg  10 mg Oral Q6H PRN Jani Gravel, MD      . rivaroxaban Alveda Reasons) tablet 20 mg  20 mg Oral Q supper Dhungel, Nishant, MD      . sodium chloride flush (NS) 0.9 % injection 10-40 mL  10-40 mL Intracatheter PRN Cherene Altes, MD   20 mL at 06/07/18 0504  . traMADol (ULTRAM) tablet 50 mg  50 mg Oral Q12H PRN Cherene Altes, MD   50 mg at 06/07/18 2109   Facility-Administered Medications Ordered in Other Encounters  Medication Dose Route Frequency Provider Last Rate Last Dose  . potassium chloride SA (K-DUR,KLOR-CON) CR tablet 40 mEq  40 mEq Oral Once Truitt Merle, MD         Discharge Medications: Please see discharge summary for a list of discharge medications.  Relevant Imaging Results:  Relevant Lab Results:   Additional Information SS# [id number]  Candie Chroman, LCSW

## 2018-06-08 NOTE — Consult Note (Signed)
   [facility] Inpatient Consult   06/08/2018  EARLENA WERST 12-29-1935 [id number]  Patient was assessed for [place] Management for needs of community services in the [facility] Advantage plan . Patient was previously active with [place] Management.  Met with inpatient [facility] Kristi regarding patient needs for being restarted with [facility] services.  She made aware of [facility] in the past with community care Freight forwarder and pharmacy. Written consent form signed on file. Went by to speak with the patient regarding to restart care management.  Called out to patient 3 times. Patient was sound asleep respirations were even and unlabored.  Did not awaken will continue to follow for transitional needs.   Of note, [facility] Care Management services does not replace or interfere with any services that are arranged by inpatient case management or social work. For additional questions or referrals please contact:  Natividad Brood, RN BSN [facility] Liaison  [phone] business mobile phone Toll free office [phone]

## 2018-06-09 DIAGNOSIS — E440 Moderate protein-calorie malnutrition: Secondary | ICD-10-CM

## 2018-06-09 DIAGNOSIS — I255 Ischemic cardiomyopathy: Secondary | ICD-10-CM

## 2018-06-09 DIAGNOSIS — I480 Paroxysmal atrial fibrillation: Secondary | ICD-10-CM

## 2018-06-09 LAB — CBC
HEMATOCRIT: 30.7 % — AB (ref 36.0–46.0)
HEMOGLOBIN: 10.1 g/dL — AB (ref 12.0–15.0)
MCH: 32.2 pg (ref 26.0–34.0)
MCHC: 32.9 g/dL (ref 30.0–36.0)
MCV: 97.8 fL (ref 78.0–100.0)
Platelets: 238 10*3/uL (ref 150–400)
RBC: 3.14 MIL/uL — ABNORMAL LOW (ref 3.87–5.11)
RDW: 14.6 % (ref 11.5–15.5)
WBC: 6.3 10*3/uL (ref 4.0–10.5)

## 2018-06-09 LAB — CK: CK TOTAL: 36 U/L — AB (ref 38–234)

## 2018-06-09 MED ORDER — ROSUVASTATIN CALCIUM 10 MG PO TABS
20.0000 mg | ORAL_TABLET | Freq: Every day | ORAL | Status: DC
  Administered 2018-06-09 – 2018-06-10 (×2): 20 mg via ORAL
  Filled 2018-06-09 (×2): qty 2

## 2018-06-09 NOTE — Care Management Important Message (Signed)
Important Message  Patient Details  Name: Lauren Richard MRN: [id number] Date of Birth: [birth year]   Medicare Important Message Given:  Yes    [name] [name] 06/09/2018, 4:20 PM

## 2018-06-09 NOTE — Progress Notes (Addendum)
Initial Nutrition Assessment  DOCUMENTATION CODES:   Non-severe (moderate) malnutrition in context of chronic illness  INTERVENTION:    Ensure Enlive po TID, each supplement provides 350 kcal and 20 grams of protein  NUTRITION DIAGNOSIS:   Moderate Malnutrition related to cancer and cancer related treatments as evidenced by moderate fat depletion, moderate muscle depletion  GOAL:   Patient will meet greater than or equal to 90% of their needs  MONITOR:   PO intake, Supplement acceptance, Labs, Skin, Weight trends, I & O's  REASON FOR ASSESSMENT:   Consult Assessment of nutrition requirement/status  ASSESSMENT:   82 yo Female with PMH of pancreatic cancer status post Whipple, hypertension, A. fib previously on Xarelto, hypothyroidism with PE about 3 months back on anticoagulation presented with generalized weakness, dizziness and shortness of breath.  Patient is s/p Whipple procedure in 12/2017. She has been evaluated per [place] RD in 02/2018. Last hospitalization was in July 2019. Identified with malnutrition.  This RD spoke with pt at bedside.  She is eating lunch (macaroni & cheese and hamburger). Reports her appetite is doing better since May/June after starting chemotherapy.  Pt typically consumes 2 meals per day. Drinks two Ensure supplements/day. Ensure Enlive supplements (not opened yet) present today on tray table. She's had a 25 lb weight loss (14%) since 02/2018. Severe for time frame.  Labs and medications reviewed. Na 133 (L).  NUTRITION - FOCUSED PHYSICAL EXAM:    Most Recent Value  Orbital Region  Mild depletion  Upper Arm Region  Moderate depletion  Thoracic and Lumbar Region  Unable to assess  Buccal Region  Mild depletion  Temple Region  Moderate depletion  Clavicle Bone Region  Moderate depletion  Clavicle and Acromion Bone Region  Moderate depletion  Scapular Bone Region  Unable to assess  Dorsal Hand  Unable to assess  Patellar Region   Mild depletion  Anterior Thigh Region  Mild depletion  Posterior Calf Region  Mild depletion  Edema (RD Assessment)  None     Diet Order:   Diet Order            Diet regular Room service appropriate? Yes; Fluid consistency: Thin  Diet effective now             EDUCATION NEEDS:   No education needs have been identified at this time  Skin:  Skin Assessment: Reviewed RN Assessment  Last BM:  8/26  Height:   Ht Readings from Last 1 Encounters:  06/05/18 5\' 7"  (1.702 m)   Weight:   Wt Readings from Last 1 Encounters:  06/09/18 67.9 kg   Wt Readings from Last 15 Encounters:  06/09/18 67.9 kg  05/23/18 67.9 kg  05/01/18 74 kg  04/24/18 66.7 kg  04/20/18 74.8 kg  04/14/18 79.3 kg  03/29/18 63.5 kg  03/23/18 76.3 kg  03/16/18 77 kg  03/09/18 79.2 kg  02/23/18 81.5 kg  01/09/18 91.2 kg  12/30/17 86.3 kg  11/24/17 89.4 kg  11/11/17 87.1 kg   Ideal Body Weight:  61.3 kg  BMI:  Body mass index is 23.46 kg/m.  Estimated Nutritional Needs:   Kcal:  1900-2100  Protein:  95-105 gm  Fluid:  1.9-2.1 L  Arthur Holms, RD, LDN Pager #: [phone] After-Hours Pager #: [phone]

## 2018-06-09 NOTE — Progress Notes (Signed)
PROGRESS NOTE                                                                                                                                                                                                             Patient Demographics:    Lauren Richard, is a 82 y.o. female, DOB - [birth year], JOI:[id number]  Admit date - 06/05/2018   Admitting Physician Jani Gravel, MD  Outpatient Primary MD for the patient is Glendale Chard, MD  LOS - 3  Outpatient Specialists: Oncology ( Dr Burr Medico)  Chief Complaint  Patient presents with  . Weakness       Brief Narrative 82 year old female with a history of pancreatic cancer status post Whipple, hypertension, A. fib previously on Xarelto, hypothyroidism with PE about 3 months back on anticoagulation presented with generalized weakness, dizziness and shortness of breath.  She was found to have elevated troponin (peaked at 0.42) and sinus tachycardia on EKG with ST elevation in inferior leads with T wave inversion in V4-V6.   Subjective:   Feels sore all over.  Denies any chest pain or shortness of breath.   Assessment  & Plan :    Principal Problem: Acute coronary syndrome/nonischemic cardiomyopathy Troponin peaked at 0.42, trended down and stable on telemetry.   2D echo with severely reduced EF of 25% with diffuse hypokinesis.  Suggestive of Takotsubo cardiomyopathy.  Continue Plavix (received loading dose on admission) and metoprolol. LDL 119. On low dose BB, cannot titrate higher due to soft blood pressure. Remains euvolemic.  Continue  strict I/O and daily weight. Cariology recommends medical management without intervention.    Active Problems: Transaminitis Chronic.  Slowly improving..  Monitor closely.  Severe hypokalemia and hypomagnesemia Has chronic low potassium and magnesium seen on previous hospitalization.  Replenished .  Generalized weakness with failure to  thrive Suspected due to dehydration, cardiomyopathy and electrolyte imbalance.  TSH normal.  Orthostasis negative.  B12 normal.  Normal CK He is extremely deconditioned and needs SNF per PT.  Discussed peer to peer for authorization.  Given that she is increasingly deconditioned recommended repeat PT today.  Moderate protein calorie malnutrition Dietitian   Adenocarcinoma of the head of the pancreas Status post Whipple's procedure in March 2019 currently on Xeloda.  History of PE in 02/2018 On Xarelto at home.  Switch lovenox to xarelto  Code Status : Full code  Family Communication  : None at bedside  Disposition Plan  : PT recommended SNF.  Discussed peer-to-peer with Dr Amalia Hailey today.  Barriers For Discharge : improving symptoms, insurance auth for SNF.  Consults  : Cardiology  Procedures  : 2D echo  DVT Prophylaxis  :  xarelto  Lab Results  Component Value Date   PLT 238 06/09/2018    Antibiotics  :    Anti-infectives (From admission, onward)   None        Objective:   Vitals:   06/08/18 2249 06/09/18 0318 06/09/18 0932 06/09/18 1351  BP: 97/63 100/64 102/65 (!) 90/59  Pulse: (!) 102 90 92 83  Resp:  18    Temp:  98.6 F (37 C)  98 F (36.7 C)  TempSrc:  Oral  Oral  SpO2:  100%  97%  Weight:  67.9 kg    Height:        Wt Readings from Last 3 Encounters:  06/09/18 67.9 kg  05/23/18 67.9 kg  05/01/18 74 kg     Intake/Output Summary (Last 24 hours) at 06/09/2018 1520 Last data filed at 06/09/2018 1352 Gross per 24 hour  Intake 830 ml  Output 350 ml  Net 480 ml    Physical exam Elderly female appears fatigued HEENT: Pallor present, moist mucosa, supple neck, temporal wasting Chest: Clear bilaterally CVs: Normal S1-S2, no murmurs GI: Soft, nondistended, nontender Musculoskeletal: Warm, no edema       Data Review:    CBC Recent Labs  Lab 06/05/18 1933 06/06/18 1001 06/07/18 0308 06/08/18 0459 06/09/18 0318  WBC 12.5* 9.1 9.4 9.0  6.3  HGB 12.3 10.5* 10.5* 10.9* 10.1*  HCT 36.2 33.2* 31.7* 33.8* 30.7*  PLT 328 233 225 248 238  MCV 97.1 100.3* 98.1 100.0 97.8  MCH 33.0 31.7 32.5 32.2 32.2  MCHC 34.0 31.6 33.1 32.2 32.9  RDW 14.8 14.4 14.5 14.7 14.6  LYMPHSABS 2.4  --   --   --   --   MONOABS 1.4*  --   --   --   --   EOSABS 0.0  --   --   --   --   BASOSABS 0.0  --   --   --   --     Chemistries  Recent Labs  Lab 06/05/18 1933 06/06/18 1001 06/07/18 0308 06/08/18 0459  NA 135 133* 134* 133*  K 4.4 2.8* 4.8 3.7  CL 101 105 108 106  CO2 24 18* 19* 20*  GLUCOSE 90 103* 105* 87  BUN 11 8 9 8   CREATININE 0.91 0.82 0.76 0.80  CALCIUM 8.7* 7.6* 8.0* 8.0*  MG  --   --  1.2* 2.0  AST 123* 102* 80* 70*  ALT 55* 44 40 36  ALKPHOS 181* 145* 149* 161*  BILITOT 1.9* 1.8* 1.4* 1.1   ------------------------------------------------------------------------------------------------------------------ Recent Labs    06/07/18 0308  CHOL 151  HDL 12*  LDLCALC 119*  TRIG 100  CHOLHDL 12.6    Lab Results  Component Value Date   HGBA1C 4.5 (L) 12/30/2017   ------------------------------------------------------------------------------------------------------------------ Recent Labs    06/07/18 0308  TSH 1.020   ------------------------------------------------------------------------------------------------------------------ Recent Labs    06/08/18 1527  VITAMINB12 1,913*    Coagulation profile Recent Labs  Lab 06/05/18 1933  INR 1.13    No results for input(s): DDIMER in the last 72 hours.  Cardiac Enzymes Recent Labs  Lab 06/06/18 0126 06/06/18 1001 06/07/18 0308  CKMB 2.8  --   --   TROPONINI 0.26* 0.16* 0.10*   ------------------------------------------------------------------------------------------------------------------ No results found for: BNP  Inpatient Medications  Scheduled Meds: . clopidogrel  75 mg Oral Daily  . feeding supplement (ENSURE ENLIVE)  237 mL Oral TID BM  .  gabapentin  200 mg Oral BID  . metoprolol tartrate  12.5 mg Oral BID  . mirtazapine  7.5 mg Oral QHS  . rivaroxaban  20 mg Oral Q supper  . rosuvastatin  20 mg Oral q1800   Continuous Infusions: PRN Meds:.acetaminophen **OR** [DISCONTINUED] acetaminophen, lidocaine-prilocaine, ondansetron (ZOFRAN) IV, oxyCODONE, pantoprazole, prochlorperazine, sodium chloride flush, traMADol  Micro Results No results found for this or any previous visit (from the past 240 hour(s)).  Radiology Reports Dg Chest 2 View  Result Date: 06/05/2018 CLINICAL DATA:  82 year old female with generalized weakness EXAM: CHEST - 2 VIEW COMPARISON:  Prior chest x-ray 04/11/2018 FINDINGS: Stable cardiac and mediastinal contours. A left subclavian approach single-lumen power injectable portacatheter is present. The tip of the catheter has retracted some and now overlies the left innominate vein. No evidence of focal airspace consolidation, infiltrate, pleural effusion or pneumothorax. Inspiratory volumes are slightly low. Chronic bronchitic changes appear similar compared to prior. No acute osseous abnormality. IMPRESSION: 1. No acute cardiopulmonary process. 2. The tip of the left IJ approach single-lumen power injectable port catheter has retracted slightly and now lies within the left innominate vein. Electronically Signed   By: Jacqulynn Cadet M.D.   On: 06/05/2018 15:32   Ct Head Wo Contrast  Result Date: 06/05/2018 CLINICAL DATA:  Muscle weakness EXAM: CT HEAD WITHOUT CONTRAST TECHNIQUE: Contiguous axial images were obtained from the base of the skull through the vertex without intravenous contrast. COMPARISON:  Head CT 04/22/2018 FINDINGS: Brain: There is no mass, hemorrhage or extra-axial collection. There is mild generalized atrophy without lobar predilection. There is no acute or chronic infarction. The brain parenchyma is normal. Vascular: No abnormal hyperdensity of the major intracranial arteries or dural venous  sinuses. No intracranial atherosclerosis. Skull: The visualized skull base, calvarium and extracranial soft tissues are normal. Sinuses/Orbits: No fluid levels or advanced mucosal thickening of the visualized paranasal sinuses. No mastoid or middle ear effusion. The orbits are normal. IMPRESSION: Mild parenchymal volume loss without acute intracranial abnormality. Electronically Signed   By: Ulyses Jarred M.D.   On: 06/05/2018 21:55   Ct Angio Chest Pe W Or Wo Contrast  Result Date: 06/06/2018 CLINICAL DATA:  82 year old female with concern for pulmonary embolism. History of pancreatic cancer status post prior Whipple procedure. EXAM: CT ANGIOGRAPHY CHEST WITH CONTRAST TECHNIQUE: Multidetector CT imaging of the chest was performed using the standard protocol during bolus administration of intravenous contrast. Multiplanar CT image reconstructions and MIPs were obtained to evaluate the vascular anatomy. CONTRAST:  154mL ISOVUE-370 IOPAMIDOL (ISOVUE-370) INJECTION 76% COMPARISON:  Chest CT dated 03/09/2018 and chest radiograph dated 06/05/2018 FINDINGS: Cardiovascular: There is no cardiomegaly or pericardial effusion. Coronary vascular calcification. Mild atherosclerotic calcification of the thoracic aorta. Linear band in the right lower lobe pulmonary artery as well as small band like density in the subsegmental right lower lobe branch consistent with chronic thrombus/scarring and similar to prior CT. No CT evidence of acute pulmonary artery embolus. Mediastinum/Nodes: No hilar or mediastinal adenopathy. Esophagus and the thyroid gland are grossly unremarkable. No mediastinal fluid collection. Lungs/Pleura: There are bibasilar dependent atelectatic changes primarily involving the lung bases. No focal consolidation, pleural effusion, or pneumothorax. The central airways are patent. Upper Abdomen:  Advanced fatty infiltration of the liver. Multiple hepatic lesions again noted and previously described as metastatic  disease. Musculoskeletal: No chest wall abnormality. No acute or significant osseous findings. Review of the MIP images confirms the above findings. IMPRESSION: 1. No acute intrathoracic pathology. No CT evidence of acute pulmonary artery embolus. 2. Severe fatty infiltration of the liver with multiple liver lesions. Electronically Signed   By: Anner Crete M.D.   On: 06/06/2018 03:37    Time Spent in minutes 25   Jahmere Bramel M.D on 06/09/2018 at 3:20 PM  Between 7am to 7pm - Pager - [phone]  After 7pm go to [email or web address] - password [email or web address]  [facility] -  Office  [phone]

## 2018-06-09 NOTE — Progress Notes (Signed)
Clinical Social Worker received a phone call from [employer] stating that patient request to go to rehab had been denied by there doctor. Patients MD made aware and contact information for Dr.Steven Evans ([facility]) was given to Dr.Dhungel for a per to per.   Dr.Dhungel contacted CSW and stated that per to per has been done. Dr.Dhungel stated that he has placed a Physical Therapy consult pending discharge and hopes the new evaluation will allow patient to get approved.  CSW spoke to Dr.Evans via phone and he stated that if he see an improvement in the new physical therapy note that he will try to approve her for at most a 7-14 day stay but stated he is not sure. CSW is awaiting physical therapy input to finish per to per. CSW made RNCM aware that if patient's per to per get denied she will need to go home with Home Health.   Rhea Pink, MSW,  [place]

## 2018-06-09 NOTE — Discharge Instructions (Addendum)
Follow with Primary MD Glendale Chard, MD in 7 days   Get CBC, CMP, 2 view Chest X ray checked  by Primary MD or SNF MD in 5-7 days    Activity: As tolerated with Full fall precautions use walker/cane & assistance as needed  Disposition SNF  Diet:  Heart Healthy    For Heart failure patients - Check your Weight same time everyday, if you gain over 2 pounds, or you develop in leg swelling, experience more shortness of breath or chest pain, call your Primary MD immediately. Follow Cardiac Low Salt Diet and 1.5 lit/day fluid restriction.  Special Instructions: If you have smoked or chewed Tobacco  in the last 2 yrs please stop smoking, stop any regular Alcohol  and or any Recreational drug use.  On your next visit with your primary care physician please Get Medicines reviewed and adjusted.  Please request your Prim.MD to go over all Hospital Tests and Procedure/Radiological results at the follow up, please get all Hospital records sent to your Prim MD by signing hospital release before you go home.  If you experience worsening of your admission symptoms, develop shortness of breath, life threatening emergency, suicidal or homicidal thoughts you must seek medical attention immediately by calling 911 or calling your MD immediately  if symptoms less severe.  You Must read complete instructions/literature along with all the possible adverse reactions/side effects for all the Medicines you take and that have been prescribed to you. Take any new Medicines after you have completely understood and accpet all the possible adverse reactions/side effects.      Information on my medicine - XARELTO (Rivaroxaban)  Why was Xarelto prescribed for you? Xarelto was prescribed for you to reduce the risk of a blood clot forming that can cause a stroke if you have a medical condition called atrial fibrillation (a type of irregular heartbeat).  And also to try to prevent reoccurence of blood clots in  lung.  What do you need to know about xarelto ? Take your Xarelto ONCE DAILY at the same time every day with your evening meal. If you have difficulty swallowing the tablet whole, you may crush it and mix in applesauce just prior to taking your dose.  Take Xarelto exactly as prescribed by your doctor and DO NOT stop taking Xarelto without talking to the doctor who prescribed the medication.  Stopping without other stroke prevention medication to take the place of Xarelto may increase your risk of developing a clot that causes a stroke.  Refill your prescription before you run out.  After discharge, you should have regular check-up appointments with your healthcare provider that is prescribing your Xarelto.  In the future your dose may need to be changed if your kidney function or weight changes by a significant amount.  What do you do if you miss a dose? If you are taking Xarelto ONCE DAILY and you miss a dose, take it as soon as you remember on the same day then continue your regularly scheduled once daily regimen the next day. Do not take two doses of Xarelto at the same time or on the same day.   Important Safety Information A possible side effect of Xarelto is bleeding. You should call your healthcare provider right away if you experience any of the following: ? Bleeding from an injury or your nose that does not stop. ? Unusual colored urine (red or dark brown) or unusual colored stools (red or black). ? Unusual bruising for unknown reasons. ?  A serious fall or if you hit your head (even if there is no bleeding).  Some medicines may interact with Xarelto and might increase your risk of bleeding while on Xarelto. To help avoid this, consult your healthcare provider or pharmacist prior to using any new prescription or non-prescription medications, including herbals, vitamins, non-steroidal anti-inflammatory drugs (NSAIDs) and supplements.  This website has more information on  Xarelto: [email or web address]

## 2018-06-09 NOTE — Progress Notes (Signed)
Progress Note  Patient Name: Lauren Richard Date of Encounter: 06/09/2018  Primary Cardiologist: Lauren Latch, MD   Subjective   Pt sore all over  Breathing is OK  No definite CP  Pt is more alert today    Inpatient Medications    Scheduled Meds: . clopidogrel  75 mg Oral Daily  . feeding supplement (ENSURE ENLIVE)  237 mL Oral TID BM  . gabapentin  200 mg Oral BID  . metoprolol tartrate  12.5 mg Oral BID  . mirtazapine  7.5 mg Oral QHS  . rivaroxaban  20 mg Oral Q supper   Continuous Infusions:  PRN Meds: acetaminophen **OR** [DISCONTINUED] acetaminophen, lidocaine-prilocaine, ondansetron (ZOFRAN) IV, oxyCODONE, pantoprazole, prochlorperazine, sodium chloride flush, traMADol   Vital Signs    Vitals:   06/08/18 1230 06/08/18 2014 06/08/18 2249 06/09/18 0318  BP: 109/75 91/65 97/63  100/64  Pulse: (!) 115 (!) 112 (!) 102 90  Resp: (!) 24 20  18   Temp:  99.2 F (37.3 C)  98.6 F (37 C)  TempSrc:  Oral  Oral  SpO2: 98% 100%  100%  Weight:    67.9 kg  Height:        Intake/Output Summary (Last 24 hours) at 06/09/2018 0848 Last data filed at 06/09/2018 0335 Gross per 24 hour  Intake 360 ml  Output 450 ml  Net -90 ml   Filed Weights   06/07/18 0622 06/08/18 0429 06/09/18 0318  Weight: 68.5 kg 68 kg 67.9 kg    Telemetry    SR/ST  90s to 100 on average- Personally Reviewed  ECG    No new - Personally Reviewed  Physical Exam   GEN: No acute distress.   Neck: No JVD Cardiac: RRR, no murmurs, rubs, or gallops.  Respiratory: Clear to auscultation bilaterally. GI: Soft, nontender, non-distended  MS: No edema; No deformity. Neuro:  Nonfocal  Psych: Normal affect   Labs    Chemistry Recent Labs  Lab 06/06/18 1001 06/07/18 0308 06/08/18 0459  NA 133* 134* 133*  K 2.8* 4.8 3.7  CL 105 108 106  CO2 18* 19* 20*  GLUCOSE 103* 105* 87  BUN 8 9 8   CREATININE 0.82 0.76 0.80  CALCIUM 7.6* 8.0* 8.0*  PROT 6.3* 6.5 7.0  ALBUMIN 1.7* 1.8* 1.8*    AST 102* 80* 70*  ALT 44 40 36  ALKPHOS 145* 149* 161*  BILITOT 1.8* 1.4* 1.1  GFRNONAA >60 >60 >60  GFRAA >60 >60 >60  ANIONGAP 10 7 7      Hematology Recent Labs  Lab 06/07/18 0308 06/08/18 0459 06/09/18 0318  WBC 9.4 9.0 6.3  RBC 3.23* 3.38* 3.14*  HGB 10.5* 10.9* 10.1*  HCT 31.7* 33.8* 30.7*  MCV 98.1 100.0 97.8  MCH 32.5 32.2 32.2  MCHC 33.1 32.2 32.9  RDW 14.5 14.7 14.6  PLT 225 248 238    Cardiac Enzymes Recent Labs  Lab 06/06/18 0126 06/06/18 1001 06/07/18 0308  TROPONINI 0.26* 0.16* 0.10*    Recent Labs  Lab 06/05/18 1715  TROPIPOC 0.42*     BNPNo results for input(s): BNP, PROBNP in the last 168 hours.   DDimer  Recent Labs  Lab 06/05/18 1933  DDIMER 2.06*     Radiology    No results found.  Cardiac Studies   Echo 06/06/18 Study Conclusions  - Left ventricle: The cavity size was normal. Wall thickness was   normal. Mid to apical anteroseptal akinesis, mid to apical   inferoseptal akinesis, mid to  apical anterolateral akinesis, mid   to apical anterior akinesis, mid to apical inferior akinesis, mid   to apical inferolateral akinesis, akinesis of the true apex. The   estimated ejection fraction was 25%. - Right ventricle: The cavity size was normal. Systolic function   was normal. - Tricuspid valve: Peak RV-RA gradient (S): 29 mm Hg. - Inferior vena cava: The vessel was normal in size. The   respirophasic diameter changes were in the normal range (>= 50%),   consistent with normal central venous pressure. - Pericardium, extracardiac: A trivial pericardial effusion was   identified posterior to the heart.  Impressions:  - Limited echo for LV function. EF 25%. Wall motion abnormalities   with preserved basal LV segments and akinesis of all the mid to   apical segments. This is suggestive of stress (Takotsubo)   cardiomyopathy but cannot rule out infarction of very large   wrap-around LAD.   Echo 04/23/18  Study  Conclusions  - Left ventricle: The cavity size was normal. Wall thickness was   normal. Systolic function was normal. The estimated ejection   fraction was in the range of 55% to 60%. Wall motion was normal;   there were no regional wall motion abnormalities. Left   ventricular diastolic function parameters were normal. - Aortic valve: Mildly calcified annulus. Trileaflet. There was   mild regurgitation. - Mitral valve: There was trivial regurgitation. - Right atrium: Central venous pressure (est): 3 mm Hg. - Tricuspid valve: There was mild regurgitation. - Pulmonary arteries: PA peak pressure: 36 mm Hg (S). - Pericardium, extracardiac: There was no pericardial effusion.   Patient Profile     82 y.o. female has hx of metastatic pancreatic cancer on oral chemo, with no significant cardiac hx. Presented with weakness, dizziness and dyspnea 06/05/18  EKG with abnormal EKG.  + troponins.     Assessment & Plan    Acute on chronic systolic CHF  LVEF is severely depressed with proxmial portion LV function preserved Echo consistent with . Possible Takotsubo vs. prox LAD lesion. I do favor Obstructive dz given EKG (extensive Q waves ) and hypercoagulable state)  Pt notes  No chest pain.    Volume status is OK   Reviewed with family    -- no plan for aggressive approach.  -- Continue BB, ASA   Treatment limited by marginal BP She is on plavix   H/H need to be followed closely.   Hx Afib   On Xarelto    Hx PE   May 2019  On Xarelto  Hypthyroidism    Pancreatic cancer. S/p [place] March 2019   On Xeloda   HLD  Will add statin   Crestor 20     Pt is full code.        For questions or updates, please contact Lauren Richard Please consult [email or web address] for contact info under Cardiology/STEMI.      Signed, Lauren Carnes, MD  06/09/2018, 8:48 AM     PT seen and examined   I agree with findings of Lauren Richard above  Pt napping when I got to room  Woke easliy but not talkative  She is  comfortable lying   Lungs are rel clear   Cardiac RRR  No S3   Abd is supple   Ext are without signif edema  BP marginal  Does not allow advancement of meds   I again favor conservative Rx    I have spoken with pt (not sure how much  understands ) and niece    Lauren Richard

## 2018-06-09 NOTE — Progress Notes (Signed)
Clinical Social Worker following patient for support and discharge needs. Patient requested to go to [facility] and rehab. CSW reached out to facility and [facility] stated they will be able to take patient once authorization through patients insurance has been attained. Authorization has been started with [employer] awaiting their response.    Rhea Pink, MSW,  [place]

## 2018-06-10 LAB — CBC
HCT: 32.9 % — ABNORMAL LOW (ref 36.0–46.0)
Hemoglobin: 10.5 g/dL — ABNORMAL LOW (ref 12.0–15.0)
MCH: 31.7 pg (ref 26.0–34.0)
MCHC: 31.9 g/dL (ref 30.0–36.0)
MCV: 99.4 fL (ref 78.0–100.0)
PLATELETS: 260 10*3/uL (ref 150–400)
RBC: 3.31 MIL/uL — ABNORMAL LOW (ref 3.87–5.11)
RDW: 14.6 % (ref 11.5–15.5)
WBC: 6.4 10*3/uL (ref 4.0–10.5)

## 2018-06-10 MED ORDER — METOPROLOL TARTRATE 25 MG PO TABS: 12.5000 mg | ORAL_TABLET | Freq: Two times a day (BID) | ORAL | Status: AC

## 2018-06-10 MED ORDER — METOPROLOL TARTRATE 25 MG PO TABS
25.0000 mg | ORAL_TABLET | Freq: Two times a day (BID) | ORAL | Status: DC
  Administered 2018-06-10 – 2018-06-11 (×2): 25 mg via ORAL
  Filled 2018-06-10 (×2): qty 1

## 2018-06-10 MED ORDER — TRAMADOL HCL 50 MG PO TABS: 50.0000 mg | ORAL_TABLET | Freq: Two times a day (BID) | ORAL | 0 refills | Status: DC | PRN

## 2018-06-10 MED ORDER — ROSUVASTATIN CALCIUM 20 MG PO TABS: 20.0000 mg | ORAL_TABLET | Freq: Every day | ORAL | Status: DC

## 2018-06-10 MED ORDER — SODIUM CHLORIDE 0.9 % IV SOLN
Freq: Once | INTRAVENOUS | Status: AC
  Administered 2018-06-10 – 2018-06-11 (×2): via INTRAVENOUS

## 2018-06-10 MED ORDER — CLOPIDOGREL BISULFATE 75 MG PO TABS: 75.0000 mg | ORAL_TABLET | Freq: Every day | ORAL | Status: DC

## 2018-06-10 MED ORDER — SODIUM CHLORIDE 0.9 % IV BOLUS
500.0000 mL | Freq: Once | INTRAVENOUS | Status: AC
  Administered 2018-06-10: 500 mL via INTRAVENOUS

## 2018-06-10 MED ORDER — OXYCODONE HCL 5 MG PO TABS: 5.0000 mg | ORAL_TABLET | Freq: Four times a day (QID) | ORAL | 0 refills | Status: AC | PRN

## 2018-06-10 MED ORDER — ENSURE ENLIVE PO LIQD: 237.0000 mL | Freq: Three times a day (TID) | ORAL | 12 refills | Status: DC

## 2018-06-10 MED ORDER — METOPROLOL TARTRATE 25 MG PO TABS: 25.0000 mg | ORAL_TABLET | Freq: Two times a day (BID) | ORAL | Status: DC

## 2018-06-10 MED ORDER — HEPARIN SOD (PORK) LOCK FLUSH 100 UNIT/ML IV SOLN
500.0000 [IU] | INTRAVENOUS | Status: AC | PRN
  Administered 2018-06-10: 500 [IU]

## 2018-06-10 NOTE — Clinical Social Work Placement (Signed)
   CLINICAL SOCIAL WORK PLACEMENT  NOTE  Date:  06/10/2018  Patient Details  Name: Lauren Richard MRN: [id number] Date of Birth: 07-Oct-1936  Clinical Social Work is seeking post-discharge placement for this patient at the [place] level of care (*CSW will initial, date and re-position this form in  chart as items are completed):  Yes   Patient/family provided with [facility] Work Department's list of facilities offering this level of care within the geographic area requested by the patient (or if unable, by the patient's family).  Yes   Patient/family informed of their freedom to choose among providers that offer the needed level of care, that participate in Medicare, Medicaid or managed care program needed by the patient, have an available bed and are willing to accept the patient.      Patient/family informed of [facility]'s ownership interest in [facility] and [facility], as well as of the fact that they are under no obligation to receive care at these facilities.  PASRR submitted to EDS on       PASRR number received on       Existing PASRR number confirmed on 06/08/18     FL2 transmitted to all facilities in geographic area requested by pt/family on       FL2 transmitted to all facilities within larger geographic area on       Patient informed that his/her managed care company has contracts with or will negotiate with certain facilities, including the following:        Yes   Patient/family informed of bed offers received.  Patient chooses bed at [facility])     Physician recommends and patient chooses bed at      Patient to be transferred to [facility]) on 06/10/18.  Patient to be transferred to facility by [name])     Patient family notified on 06/10/18 of transfer.  Name of family member notified:  Baruch Goldmann (Niece) [phone]     PHYSICIAN       Additional Comment:     _______________________________________________ Claudine Mouton, LCSWA 06/10/2018, 4:41 PM

## 2018-06-10 NOTE — Progress Notes (Signed)
CSW received a call from pt's RN stating the provider has decided to keep the pt overnight for observation.  CSW will notifiy:  Family - Niece Lauren Richard at ph: [phone] notified and niece provided with RN's number for an update.  [place] in admissions at [facility] notified. PTAR - PTAR notified/PTAR cancelled.

## 2018-06-10 NOTE — Progress Notes (Signed)
CSW received a call from [facility] has been accepted by: Winona Legato Number for report is: [phone] Pt's unit/room/bed number will be: [id number] Accepting physician: SNF MD  Pt can arrive ASAP on 8/31  PTAR called.  CSW will update RN.  Lauren Richard. Kailena Lubas, LCSW, LCAS, CSI Clinical Social Worker Ph: [phone]

## 2018-06-10 NOTE — Progress Notes (Signed)
  Patient being discharged today to [facility].   Dr. Alan Ripper note reviewed.  Agree with her recommendations.   No change to meds currently.   We will sign off.  F/u with [facility] as scheduled.   Glori Bickers, MD  11:48 AM

## 2018-06-10 NOTE — Progress Notes (Addendum)
CSW received a call from [place] from [facility] [employer] who stated that after the peer-to-peer review the pt was approved for 7 SNF days and can D/C whenever medically appropriate to a [facility], the pt's chosen SNF.  Per Joseph Art [employer] authorization # is [id number].  CSW spoke to [place] at [facility] who stated pt can come today.  CSW will continue to follow for D/C needs.  Alphonse Guild. Charleen Madera, LCSW, LCAS, CSI Clinical Social Worker Ph: [phone]

## 2018-06-10 NOTE — Progress Notes (Signed)
CSW sent pt's D/C summary via the hub to [facility] at [facility] request.  CSW contacted pt's RN, RN updated.  CSW will provide pt's RN with the pt's med necessity transport form and call PTAR at RN's request.  CSW will continue to follow for D/C needs.  Alphonse Guild. Ayham Word, LCSW, LCAS, CSI Clinical Social Worker Ph: [phone]

## 2018-06-10 NOTE — Discharge Summary (Addendum)
Note patient was initially discharged on 06/10/2018 however she was held back as staff noted mild tachycardia, this morning she is symptom-free, heart rate between 90-100, have evaluated her chart she is chronically been mildly tachycardic at this range for at least last several months.  Have repeated gentle IV bolus, increased her beta-blocker dose, completely symptom-free continue discharge to SNF.  Blood pressure 98/65, pulse 98, temperature 97.9 F (36.6 C), temperature source Oral, resp. rate 19, height 5\' 7"  (1.702 m), weight 67.4 kg, SpO2 100 %.    Lauren Richard [id number] DOB: 1936/04/07 DOA: 06/05/2018  PCP: Glendale Chard, MD  Admit date: 06/05/2018  Discharge date: 06/11/2018  Admitted From: Home   Disposition:  SNF   Recommendations for Outpatient Follow-up:   Follow up with PCP in 1-2 weeks  PCP Please obtain BMP/CBC, 2 view CXR in 1week,  (see Discharge instructions)   PCP Please follow up on the following pending results:    Home Health: None   Equipment/Devices: None  Consultations: Cards Discharge Condition: Fair   CODE STATUS: Full   Diet Recommendation:  Heart Healthy    Chief Complaint  Patient presents with  . Weakness     Brief history of present illness from the day of admission and additional interim summary     82 year old female with a history of pancreatic cancer status post Whipple, hypertension, A. fib previously on Xarelto, hypothyroidism with PE about 3 months back on anticoagulation presented with generalized weakness, dizziness and shortness of breath.  She was found to have elevated troponin (peaked at 0.42) and sinus tachycardia on EKG with ST elevation in inferior leads with T wave inversion in V4-V6.   Hospital Course    Acute coronary syndrome/nonischemic cardiomyopathy - with acute drop in EF from previous 55% to not 25%, troponin rise was mild and peaked at 0.42, clinical picture Suggestive of Takotsubo cardiomyopathy.  Seen also by cardiology for now continue on combination of Plavix, low-dose beta-blocker as baseline blood pressures are low, already on Xarelto at home, continue statin for secondary prevention as LDL was 119, clinically compensated without any rails or shortness of breath.  Currently chest pain and symptom-free.  Appears euvolemic.  SNF discharge with outpatient cardiology follow-up.    Transaminitis  -  Chronic.  Slowly improving..  Monitor closely.  Severe hypokalemia and hypomagnesemia  -   Has chronic low potassium and magnesium seen on previous hospitalization.  Replenished and stable.  Generalized weakness with failure to thrive  -   Suspected due to dehydration, cardiomyopathy and electrolyte imbalance.  TSH normal.  Orthostasis negative.  B12 normal.  Normal CK, she is extremely deconditioned and needs SNF per PT.  Discussed peer to peer for authorization.  Will be discharged to SNF once SNF bed is approved.  Moderate protein calorie malnutrition  - protein supplementation to be continued at SNF  Adenocarcinoma of the head of the pancreas  -  Status post Whipple's procedure in March 2019 currently on Xeloda.  History of PE in 02/2018  -  continue on home dose Xarelto    Discharge diagnosis     Principal Problem:   Tachycardia Active Problems:   Elevated troponin   Dyspnea   Abnormal liver function    Discharge instructions    Discharge Instructions    Diet - low sodium heart healthy   Complete by:  As directed    Discharge instructions   Complete by:  As directed    Follow with Primary MD Glendale Chard, MD in 7 days   Get CBC, CMP, 2 view Chest X ray checked  by Primary MD or SNF MD in 5-7 days    Activity: As tolerated with Full fall  precautions use walker/cane & assistance as needed  Disposition SNF  Diet:  Heart Healthy    For Heart failure patients - Check your Weight same time everyday, if you gain over 2 pounds, or you develop in leg swelling, experience more shortness of breath or chest pain, call your Primary MD immediately. Follow Cardiac Low Salt Diet and 1.5 lit/day fluid restriction.  Special Instructions: If you have smoked or chewed Tobacco  in the last 2 yrs please stop smoking, stop any regular Alcohol  and or any Recreational drug use.  On your next visit with your primary care physician please Get Medicines reviewed and adjusted.  Please request your Prim.MD to go over all Hospital Tests and Procedure/Radiological results at the follow up, please get all Hospital records sent to your Prim MD by signing hospital release before you go home.  If you experience worsening of your admission symptoms, develop shortness of breath, life threatening emergency, suicidal or homicidal thoughts you must seek medical attention immediately by calling 911 or calling your MD immediately  if symptoms less severe.  You Must read complete instructions/literature along with all the possible adverse reactions/side effects for all the Medicines you take and that have been prescribed to you. Take any new Medicines after you have completely understood and accpet all the possible adverse reactions/side effects.   Increase activity slowly   Complete by:  As directed       Discharge Medications   Allergies as of 06/11/2018   No Known Allergies     Medication List    TAKE these medications   capecitabine 500 MG tablet Commonly known as:  XELODA Take 2 tablets (1000mg ) by mouth 2 times daily, within 30 min of finishing food. Take on days 1-7 and 15-21 of each 28 day cycle   clopidogrel 75 MG tablet Commonly known as:  PLAVIX Take 1 tablet (75 mg total) by mouth daily.   feeding supplement (ENSURE ENLIVE) Liqd Take 237 mLs  by mouth 3 (three) times daily between meals.   gabapentin 100 MG capsule Commonly known as:  NEURONTIN Take 2 capsules (200 mg total) by mouth 2 (two) times daily.   lidocaine-prilocaine cream Commonly known as:  EMLA Apply 1 application topically as needed.   metoprolol tartrate 25 MG tablet Commonly known as:  LOPRESSOR Take 0.5 tablets (12.5 mg total) by mouth 2 (two) times daily.   mirtazapine 7.5 MG tablet Commonly known as:  REMERON Take 1 tablet (7.5 mg total) by mouth at bedtime.   nystatin 100000 UNIT/ML suspension Commonly known as:  MYCOSTATIN Take 5 mLs (500,000 Units total) by mouth 4 (four) times daily.   ondansetron 8 MG tablet Commonly known as:  ZOFRAN Take 8 mg by mouth 2 (two) times daily as needed for nausea/vomiting.   oxyCODONE 5 MG immediate release  tablet Commonly known as:  Oxy IR/ROXICODONE Take 1-2 tablets (5-10 mg total) by mouth every 6 (six) hours as needed for severe pain.   pantoprazole 40 MG tablet Commonly known as:  PROTONIX Take 1 tablet (40 mg total) by mouth daily at 12 noon. What changed:    when to take this  reasons to take this   potassium chloride SA 20 MEQ tablet Commonly known as:  K-DUR,KLOR-CON Take 2 tablets (40 mEq total) by mouth daily. What changed:  how much to take   PRESCRIPTION MEDICATION Apply 1 application topically 4 (four) times daily as needed (for back pain). Dic 3%, Bac 2%, lid - pril 2-5%, Menth 1% cream   prochlorperazine 10 MG tablet Commonly known as:  COMPAZINE Take 10 mg by mouth every 6 (six) hours as needed for nausea or vomiting.   rivaroxaban 20 MG Tabs tablet Commonly known as:  XARELTO Take 1 tablet (20 mg total) by mouth daily with supper.   rosuvastatin 20 MG tablet Commonly known as:  CRESTOR Take 1 tablet (20 mg total) by mouth daily at 6 PM.   traMADol 50 MG tablet Commonly known as:  ULTRAM Take 1 tablet (50 mg total) by mouth every 12 (twelve) hours as needed (for pain.).     trolamine salicylate 10 % cream Commonly known as:  ASPERCREME Apply 1 application topically 4 (four) times daily as needed (for knee pain/muscle pain.).        Contact information for follow-up providers    Fay Records, MD. Schedule an appointment as soon as possible for a visit in 1 week(s).   Specialty:  Cardiology Why:  NSTEMI Contact information: [place] [address] [phone]            Contact information for after-discharge care    Destination    [facility]erred [facility] .   Service:  Skilled Nursing Contact information: [place] [phone]                  Major procedures and Radiology Reports - PLEASE review detailed and final reports thoroughly  -      TTE - Limited echo for LV function. EF 25%. Wall motion abnormalities  with preserved basal LV segments and akinesis of all the mid to   apical segments. This is suggestive of stress (Takotsubo)   cardiomyopathy but cannot rule out infarction of very large wrap-around LAD.   Dg Chest 2 View  Result Date: 06/05/2018 CLINICAL DATA:  82 year old female with generalized weakness EXAM: CHEST - 2 VIEW COMPARISON:  Prior chest x-ray 04/11/2018 FINDINGS: Stable cardiac and mediastinal contours. A left subclavian approach single-lumen power injectable portacatheter is present. The tip of the catheter has retracted some and now overlies the left innominate vein. No evidence of focal airspace consolidation, infiltrate, pleural effusion or pneumothorax. Inspiratory volumes are slightly low. Chronic bronchitic changes appear similar compared to prior. No acute osseous abnormality. IMPRESSION: 1. No acute cardiopulmonary process. 2. The tip of the left IJ approach single-lumen power injectable port catheter has retracted slightly and now lies within the left innominate vein. Electronically Signed   By: Jacqulynn Cadet M.D.   On:  06/05/2018 15:32   Ct Head Wo Contrast  Result Date: 06/05/2018 CLINICAL DATA:  Muscle weakness EXAM: CT HEAD WITHOUT CONTRAST TECHNIQUE: Contiguous axial images were obtained from the base of the skull through the vertex without intravenous contrast. COMPARISON:  Head  CT 04/22/2018 FINDINGS: Brain: There is no mass, hemorrhage or extra-axial collection. There is mild generalized atrophy without lobar predilection. There is no acute or chronic infarction. The brain parenchyma is normal. Vascular: No abnormal hyperdensity of the major intracranial arteries or dural venous sinuses. No intracranial atherosclerosis. Skull: The visualized skull base, calvarium and extracranial soft tissues are normal. Sinuses/Orbits: No fluid levels or advanced mucosal thickening of the visualized paranasal sinuses. No mastoid or middle ear effusion. The orbits are normal. IMPRESSION: Mild parenchymal volume loss without acute intracranial abnormality. Electronically Signed   By: Ulyses Jarred M.D.   On: 06/05/2018 21:55   Ct Angio Chest Pe W Or Wo Contrast  Result Date: 06/06/2018 CLINICAL DATA:  82 year old female with concern for pulmonary embolism. History of pancreatic cancer status post prior Whipple procedure. EXAM: CT ANGIOGRAPHY CHEST WITH CONTRAST TECHNIQUE: Multidetector CT imaging of the chest was performed using the standard protocol during bolus administration of intravenous contrast. Multiplanar CT image reconstructions and MIPs were obtained to evaluate the vascular anatomy. CONTRAST:  13mL ISOVUE-370 IOPAMIDOL (ISOVUE-370) INJECTION 76% COMPARISON:  Chest CT dated 03/09/2018 and chest radiograph dated 06/05/2018 FINDINGS: Cardiovascular: There is no cardiomegaly or pericardial effusion. Coronary vascular calcification. Mild atherosclerotic calcification of the thoracic aorta. Linear band in the right lower lobe pulmonary artery as well as small band like density in the subsegmental right lower lobe branch  consistent with chronic thrombus/scarring and similar to prior CT. No CT evidence of acute pulmonary artery embolus. Mediastinum/Nodes: No hilar or mediastinal adenopathy. Esophagus and the thyroid gland are grossly unremarkable. No mediastinal fluid collection. Lungs/Pleura: There are bibasilar dependent atelectatic changes primarily involving the lung bases. No focal consolidation, pleural effusion, or pneumothorax. The central airways are patent. Upper Abdomen: Advanced fatty infiltration of the liver. Multiple hepatic lesions again noted and previously described as metastatic disease. Musculoskeletal: No chest wall abnormality. No acute or significant osseous findings. Review of the MIP images confirms the above findings. IMPRESSION: 1. No acute intrathoracic pathology. No CT evidence of acute pulmonary artery embolus. 2. Severe fatty infiltration of the liver with multiple liver lesions. Electronically Signed   By: Anner Crete M.D.   On: 06/06/2018 03:37    Micro Results     No results found for this or any previous visit (from the past 240 hour(s)).  Today   Subjective    Lauren Richard today has no headache,no chest abdominal pain,no new weakness tingling or numbness, feels much better wants to go home today.     Objective   Blood pressure 98/65, pulse 98, temperature 97.9 F (36.6 C), temperature source Oral, resp. rate 19, height 5\' 7"  (1.702 m), weight 67.4 kg, SpO2 100 %.   Intake/Output Summary (Last 24 hours) at 06/11/2018 1133 Last data filed at 06/10/2018 1327 Gross per 24 hour  Intake 120 ml  Output -  Net 120 ml    Exam Awake Alert, Oriented x 3, No new F.N deficits, Normal affect [place].AT,PERRAL Supple Neck,No JVD, No cervical lymphadenopathy appriciated.  Symmetrical Chest wall movement, Good air movement bilaterally, CTAB RRR,No Gallops,Rubs or new Murmurs, No Parasternal Heave +ve B.Sounds, Abd Soft, Non tender, No organomegaly appriciated, No rebound -guarding  or rigidity. No Cyanosis, Clubbing or edema, No new Rash or bruise   Data Review   CBC w Diff:  Lab Results  Component Value Date   WBC 7.8 06/11/2018   HGB 10.6 (L) 06/11/2018   HGB 12.4 05/30/2018   HCT 33.5 (L) 06/11/2018  PLT 265 06/11/2018   PLT 419 (H) 05/30/2018   LYMPHOPCT 19 06/05/2018   BANDSPCT 5 04/03/2018   MONOPCT 11 06/05/2018   EOSPCT 0 06/05/2018   BASOPCT 0 06/05/2018    CMP:  Lab Results  Component Value Date   NA 133 (L) 06/08/2018   K 3.7 06/08/2018   CL 106 06/08/2018   CO2 20 (L) 06/08/2018   BUN 8 06/08/2018   CREATININE 0.80 06/08/2018   CREATININE 0.82 05/30/2018   CREATININE 1.07 (H) 11/08/2016   PROT 7.0 06/08/2018   ALBUMIN 1.8 (L) 06/08/2018   BILITOT 1.1 06/08/2018   BILITOT 1.1 05/30/2018   ALKPHOS 161 (H) 06/08/2018   AST 70 (H) 06/08/2018   AST 106 (H) 05/30/2018   ALT 36 06/08/2018   ALT 48 (H) 05/30/2018  .   Total Time in preparing paper work, data evaluation and todays exam - 28 minutes  Lala Lund M.D on 06/11/2018 at 11:33 AM  [facility]   Office  [phone]

## 2018-06-10 NOTE — Progress Notes (Signed)
Paged doctor singh in formed him the pt. H. R. Was now in the 120's he ordered an increase in the lopressor form 12.5 to 25 mg. And to give the evening  Does now and to cancel the discharge .

## 2018-06-10 NOTE — Progress Notes (Signed)
Physical Therapy Treatment Patient Details Name: Lauren Richard MRN: [id number] DOB: [birth year] Today's Date: 06/10/2018    History of Present Illness Pt is an 82 y/o female with PMH including but not limited to hypothyroidism, hypertension, pancreatic cancer s/p whipple (01/05/18) admitted secondary to c/o generalized weakness, dizziness and dyspnea. Cardiology was consulted and pt found to have an abnormal EKG and acute on chronic CHF with no aggressive treatment per cardiology at this time.    PT Comments    Patient able to progress today.  Better able to assist with transfers and able to take a few steps to the chair with +2 mod assistance.  Agree patient would benefit from follow-up PT in SNF to increase mobility and independence for hopeful return home.  Currently patient requires assistance of 2 persons for out of bed activities due to generalized weakness.     Follow Up Recommendations  SNF     Equipment Recommendations  None recommended by PT    Recommendations for Other Services       Precautions / Restrictions      Mobility  Bed Mobility Overal bed mobility: Needs Assistance Bed Mobility: Supine to Sit     Supine to sit: Mod assist     General bed mobility comments: increased time and effort, assist with bilateral LE movement off of bed, HOB elevated, use of bed rails  Transfers Overall transfer level: Needs assistance Equipment used: Rolling walker (2 wheeled) Transfers: Sit to/from Stand Sit to Stand: Mod assist;+2 physical assistance Stand pivot transfers: Mod assist;+2 physical assistance       General transfer comment: increased time and effort; assistance to power up; able to take a few steps to chair with RW  Ambulation/Gait             General Gait Details: able to take a few steps to the chair as part of stand pivot transfer   Stairs             Wheelchair Mobility    Modified Rankin (Stroke Patients Only)       Balance  Overall balance assessment: Needs assistance Sitting-balance support: No upper extremity supported;Feet supported Sitting balance-Leahy Scale: Fair     Standing balance support: During functional activity;Bilateral upper extremity supported Standing balance-Leahy Scale: Poor Standing balance comment: mod assist; reliant on RW for support                            Cognition Arousal/Alertness: Awake/alert Behavior During Therapy: WFL for tasks assessed/performed Overall Cognitive Status: Impaired/Different from baseline                         Following Commands: Follows one step commands with increased time     Problem Solving: Difficulty sequencing;Requires verbal cues        Exercises General Exercises - Lower Extremity Ankle Circles/Pumps: AROM;Both;10 reps;Seated Quad Sets: AROM;Both;10 reps;Seated Gluteal Sets: Seated;Both;AROM;10 reps    General Comments        Pertinent Vitals/Pain Pain Assessment: Faces Faces Pain Scale: Hurts a little bit Pain Location: bilateral LEs; "my legs are sore" Pain Descriptors / Indicators: Sore Pain Intervention(s): Limited activity within patient's tolerance;Monitored during session    Home Living                      Prior Function            PT  Goals (current goals can now be found in the care plan section) Progress towards PT goals: Progressing toward goals    Frequency    Min 3X/week      PT Plan Current plan remains appropriate    Co-evaluation              AM-PAC PT "6 Clicks" Daily Activity  Outcome Measure  Difficulty turning over in bed (including adjusting bedclothes, sheets and blankets)?: Unable Difficulty moving from lying on back to sitting on the side of the bed? : Unable Difficulty sitting down on and standing up from a chair with arms (e.g., wheelchair, bedside commode, etc,.)?: Unable Help needed moving to and from a bed to chair (including a wheelchair)?: A  Lot Help needed walking in hospital room?: A Lot Help needed climbing 3-5 steps with a railing? : Total 6 Click Score: 8    End of Session Equipment Utilized During Treatment: Gait belt Activity Tolerance: Patient limited by fatigue Patient left: in chair;with chair alarm set;with call bell/phone within reach   PT Visit Diagnosis: Other abnormalities of gait and mobility (R26.89);Muscle weakness (generalized) (M62.81);Pain Pain - part of body: Leg     Time: 1035-1055 PT Time Calculation (min) (ACUTE ONLY): 20 min  Charges:  $Therapeutic Activity: 8-22 mins                     06/10/2018 [place], PT Acute Rehabilitation Services Pager:  [phone] Office:  [phone]     Shanna Cisco 06/10/2018, 11:23 AM

## 2018-06-10 NOTE — Progress Notes (Signed)
IV consult reported that the cath has to be re-inserted and Lab will have to draw CBC labs. LAB was notified.

## 2018-06-10 NOTE — Progress Notes (Signed)
Request an IV consult to draw morning CBC labs; I'm unable to get Labs

## 2018-06-10 NOTE — Progress Notes (Signed)
Sent PT note to [facility] for review.  Percell Locus Shaniah Baltes LCSW [phone]

## 2018-06-11 DIAGNOSIS — R2689 Other abnormalities of gait and mobility: Secondary | ICD-10-CM | POA: Diagnosis not present

## 2018-06-11 DIAGNOSIS — R000 Tachycardia, unspecified: Secondary | ICD-10-CM | POA: Diagnosis not present

## 2018-06-11 DIAGNOSIS — Z9049 Acquired absence of other specified parts of digestive tract: Secondary | ICD-10-CM | POA: Diagnosis not present

## 2018-06-11 DIAGNOSIS — A419 Sepsis, unspecified organism: Secondary | ICD-10-CM | POA: Diagnosis not present

## 2018-06-11 DIAGNOSIS — Z79899 Other long term (current) drug therapy: Secondary | ICD-10-CM | POA: Diagnosis not present

## 2018-06-11 DIAGNOSIS — E876 Hypokalemia: Secondary | ICD-10-CM | POA: Diagnosis not present

## 2018-06-11 DIAGNOSIS — K7689 Other specified diseases of liver: Secondary | ICD-10-CM | POA: Diagnosis not present

## 2018-06-11 DIAGNOSIS — I959 Hypotension, unspecified: Secondary | ICD-10-CM | POA: Diagnosis not present

## 2018-06-11 DIAGNOSIS — N189 Chronic kidney disease, unspecified: Secondary | ICD-10-CM | POA: Diagnosis not present

## 2018-06-11 DIAGNOSIS — E8809 Other disorders of plasma-protein metabolism, not elsewhere classified: Secondary | ICD-10-CM | POA: Diagnosis not present

## 2018-06-11 DIAGNOSIS — Z7189 Other specified counseling: Secondary | ICD-10-CM | POA: Diagnosis not present

## 2018-06-11 DIAGNOSIS — G92 Toxic encephalopathy: Secondary | ICD-10-CM | POA: Diagnosis not present

## 2018-06-11 DIAGNOSIS — I249 Acute ischemic heart disease, unspecified: Secondary | ICD-10-CM | POA: Diagnosis not present

## 2018-06-11 DIAGNOSIS — R278 Other lack of coordination: Secondary | ICD-10-CM | POA: Diagnosis not present

## 2018-06-11 DIAGNOSIS — R2681 Unsteadiness on feet: Secondary | ICD-10-CM | POA: Diagnosis not present

## 2018-06-11 DIAGNOSIS — C7889 Secondary malignant neoplasm of other digestive organs: Secondary | ICD-10-CM | POA: Diagnosis not present

## 2018-06-11 DIAGNOSIS — E162 Hypoglycemia, unspecified: Secondary | ICD-10-CM | POA: Diagnosis not present

## 2018-06-11 DIAGNOSIS — M6281 Muscle weakness (generalized): Secondary | ICD-10-CM | POA: Diagnosis not present

## 2018-06-11 DIAGNOSIS — R64 Cachexia: Secondary | ICD-10-CM | POA: Diagnosis not present

## 2018-06-11 DIAGNOSIS — R1084 Generalized abdominal pain: Secondary | ICD-10-CM | POA: Diagnosis not present

## 2018-06-11 DIAGNOSIS — Z9221 Personal history of antineoplastic chemotherapy: Secondary | ICD-10-CM | POA: Diagnosis not present

## 2018-06-11 DIAGNOSIS — C801 Malignant (primary) neoplasm, unspecified: Secondary | ICD-10-CM | POA: Diagnosis not present

## 2018-06-11 DIAGNOSIS — I10 Essential (primary) hypertension: Secondary | ICD-10-CM | POA: Diagnosis not present

## 2018-06-11 DIAGNOSIS — I6523 Occlusion and stenosis of bilateral carotid arteries: Secondary | ICD-10-CM | POA: Diagnosis not present

## 2018-06-11 DIAGNOSIS — A4159 Other Gram-negative sepsis: Secondary | ICD-10-CM | POA: Diagnosis not present

## 2018-06-11 DIAGNOSIS — R9431 Abnormal electrocardiogram [ECG] [EKG]: Secondary | ICD-10-CM | POA: Diagnosis not present

## 2018-06-11 DIAGNOSIS — Z800 Family history of malignant neoplasm of digestive organs: Secondary | ICD-10-CM | POA: Diagnosis not present

## 2018-06-11 DIAGNOSIS — R404 Transient alteration of awareness: Secondary | ICD-10-CM | POA: Diagnosis not present

## 2018-06-11 DIAGNOSIS — K5289 Other specified noninfective gastroenteritis and colitis: Secondary | ICD-10-CM | POA: Diagnosis not present

## 2018-06-11 DIAGNOSIS — Z90411 Acquired partial absence of pancreas: Secondary | ICD-10-CM | POA: Diagnosis not present

## 2018-06-11 DIAGNOSIS — C787 Secondary malignant neoplasm of liver and intrahepatic bile duct: Secondary | ICD-10-CM | POA: Diagnosis not present

## 2018-06-11 DIAGNOSIS — R627 Adult failure to thrive: Secondary | ICD-10-CM | POA: Diagnosis not present

## 2018-06-11 DIAGNOSIS — C259 Malignant neoplasm of pancreas, unspecified: Secondary | ICD-10-CM | POA: Diagnosis not present

## 2018-06-11 DIAGNOSIS — Z515 Encounter for palliative care: Secondary | ICD-10-CM | POA: Diagnosis not present

## 2018-06-11 DIAGNOSIS — M545 Low back pain: Secondary | ICD-10-CM | POA: Diagnosis not present

## 2018-06-11 DIAGNOSIS — J189 Pneumonia, unspecified organism: Secondary | ICD-10-CM | POA: Diagnosis not present

## 2018-06-11 DIAGNOSIS — M6088 Other myositis, other site: Secondary | ICD-10-CM | POA: Diagnosis not present

## 2018-06-11 DIAGNOSIS — C800 Disseminated malignant neoplasm, unspecified: Secondary | ICD-10-CM | POA: Diagnosis not present

## 2018-06-11 DIAGNOSIS — K5900 Constipation, unspecified: Secondary | ICD-10-CM | POA: Diagnosis not present

## 2018-06-11 DIAGNOSIS — Z95828 Presence of other vascular implants and grafts: Secondary | ICD-10-CM | POA: Diagnosis not present

## 2018-06-11 DIAGNOSIS — Z96652 Presence of left artificial knee joint: Secondary | ICD-10-CM | POA: Diagnosis not present

## 2018-06-11 DIAGNOSIS — R509 Fever, unspecified: Secondary | ICD-10-CM | POA: Diagnosis not present

## 2018-06-11 DIAGNOSIS — E873 Alkalosis: Secondary | ICD-10-CM | POA: Diagnosis not present

## 2018-06-11 DIAGNOSIS — R29810 Facial weakness: Secondary | ICD-10-CM | POA: Diagnosis not present

## 2018-06-11 DIAGNOSIS — M2550 Pain in unspecified joint: Secondary | ICD-10-CM | POA: Diagnosis not present

## 2018-06-11 DIAGNOSIS — B370 Candidal stomatitis: Secondary | ICD-10-CM | POA: Diagnosis not present

## 2018-06-11 DIAGNOSIS — T797XXA Traumatic subcutaneous emphysema, initial encounter: Secondary | ICD-10-CM | POA: Diagnosis not present

## 2018-06-11 DIAGNOSIS — C786 Secondary malignant neoplasm of retroperitoneum and peritoneum: Secondary | ICD-10-CM | POA: Diagnosis not present

## 2018-06-11 DIAGNOSIS — E161 Other hypoglycemia: Secondary | ICD-10-CM | POA: Diagnosis not present

## 2018-06-11 DIAGNOSIS — R7881 Bacteremia: Secondary | ICD-10-CM | POA: Diagnosis not present

## 2018-06-11 DIAGNOSIS — R945 Abnormal results of liver function studies: Secondary | ICD-10-CM | POA: Diagnosis not present

## 2018-06-11 DIAGNOSIS — G893 Neoplasm related pain (acute) (chronic): Secondary | ICD-10-CM | POA: Diagnosis not present

## 2018-06-11 DIAGNOSIS — M7989 Other specified soft tissue disorders: Secondary | ICD-10-CM | POA: Diagnosis not present

## 2018-06-11 DIAGNOSIS — N3000 Acute cystitis without hematuria: Secondary | ICD-10-CM | POA: Diagnosis not present

## 2018-06-11 DIAGNOSIS — R4020 Unspecified coma: Secondary | ICD-10-CM | POA: Diagnosis not present

## 2018-06-11 DIAGNOSIS — R7989 Other specified abnormal findings of blood chemistry: Secondary | ICD-10-CM | POA: Diagnosis not present

## 2018-06-11 DIAGNOSIS — M25542 Pain in joints of left hand: Secondary | ICD-10-CM | POA: Diagnosis not present

## 2018-06-11 DIAGNOSIS — X58XXXA Exposure to other specified factors, initial encounter: Secondary | ICD-10-CM | POA: Diagnosis present

## 2018-06-11 DIAGNOSIS — R41841 Cognitive communication deficit: Secondary | ICD-10-CM | POA: Diagnosis not present

## 2018-06-11 DIAGNOSIS — Z86711 Personal history of pulmonary embolism: Secondary | ICD-10-CM | POA: Diagnosis not present

## 2018-06-11 DIAGNOSIS — I5181 Takotsubo syndrome: Secondary | ICD-10-CM | POA: Diagnosis not present

## 2018-06-11 DIAGNOSIS — Z7401 Bed confinement status: Secondary | ICD-10-CM | POA: Diagnosis not present

## 2018-06-11 DIAGNOSIS — L03113 Cellulitis of right upper limb: Secondary | ICD-10-CM | POA: Diagnosis not present

## 2018-06-11 DIAGNOSIS — N390 Urinary tract infection, site not specified: Secondary | ICD-10-CM | POA: Diagnosis not present

## 2018-06-11 DIAGNOSIS — E872 Acidosis: Secondary | ICD-10-CM | POA: Diagnosis not present

## 2018-06-11 DIAGNOSIS — C250 Malignant neoplasm of head of pancreas: Secondary | ICD-10-CM | POA: Diagnosis not present

## 2018-06-11 DIAGNOSIS — R531 Weakness: Secondary | ICD-10-CM | POA: Diagnosis not present

## 2018-06-11 DIAGNOSIS — M542 Cervicalgia: Secondary | ICD-10-CM | POA: Diagnosis not present

## 2018-06-11 DIAGNOSIS — R52 Pain, unspecified: Secondary | ICD-10-CM | POA: Diagnosis not present

## 2018-06-11 DIAGNOSIS — N179 Acute kidney failure, unspecified: Secondary | ICD-10-CM | POA: Diagnosis not present

## 2018-06-11 DIAGNOSIS — I4891 Unspecified atrial fibrillation: Secondary | ICD-10-CM | POA: Diagnosis not present

## 2018-06-11 DIAGNOSIS — R498 Other voice and resonance disorders: Secondary | ICD-10-CM | POA: Diagnosis not present

## 2018-06-11 DIAGNOSIS — R4781 Slurred speech: Secondary | ICD-10-CM | POA: Diagnosis not present

## 2018-06-11 DIAGNOSIS — E440 Moderate protein-calorie malnutrition: Secondary | ICD-10-CM | POA: Diagnosis not present

## 2018-06-11 DIAGNOSIS — G9341 Metabolic encephalopathy: Secondary | ICD-10-CM | POA: Diagnosis not present

## 2018-06-11 DIAGNOSIS — E039 Hypothyroidism, unspecified: Secondary | ICD-10-CM | POA: Diagnosis not present

## 2018-06-11 DIAGNOSIS — B957 Other staphylococcus as the cause of diseases classified elsewhere: Secondary | ICD-10-CM | POA: Diagnosis not present

## 2018-06-11 DIAGNOSIS — R5381 Other malaise: Secondary | ICD-10-CM | POA: Diagnosis not present

## 2018-06-11 DIAGNOSIS — R109 Unspecified abdominal pain: Secondary | ICD-10-CM | POA: Diagnosis not present

## 2018-06-11 DIAGNOSIS — R8271 Bacteriuria: Secondary | ICD-10-CM | POA: Diagnosis not present

## 2018-06-11 LAB — CBC
HCT: 33.5 % — ABNORMAL LOW (ref 36.0–46.0)
Hemoglobin: 10.6 g/dL — ABNORMAL LOW (ref 12.0–15.0)
MCH: 32.7 pg (ref 26.0–34.0)
MCHC: 31.6 g/dL (ref 30.0–36.0)
MCV: 103.4 fL — ABNORMAL HIGH (ref 78.0–100.0)
Platelets: 265 10*3/uL (ref 150–400)
RBC: 3.24 MIL/uL — ABNORMAL LOW (ref 3.87–5.11)
RDW: 14.7 % (ref 11.5–15.5)
WBC: 7.8 10*3/uL (ref 4.0–10.5)

## 2018-06-11 LAB — TSH: TSH: 1.787 u[IU]/mL (ref 0.350–4.500)

## 2018-06-11 NOTE — Progress Notes (Signed)
06/11/2018 1:01 PM Attempted to call [place] to give report on patient, was transferred to an answering service. [place]

## 2018-06-11 NOTE — Clinical Social Work Note (Signed)
Clinical Social Worker facilitated patient discharge including contacting patient family and facility to confirm patient discharge plans.  Clinical information faxed to facility and family agreeable with plan.  CSW arranged ambulance transport via [facility] to [facility] .  RN to call [phone] for report prior to discharge.  Clinical Social Worker will sign off for now as social work intervention is no longer needed. Please consult [place] again if new need arises.  [place], [place]

## 2018-06-11 NOTE — Progress Notes (Signed)
06/11/2018 12:55 PM Pt D/C to [facility] place per [facility].  Will call report to NF. [place]

## 2018-06-11 NOTE — Clinical Social Work Placement (Signed)
   CLINICAL SOCIAL WORK PLACEMENT  NOTE  Date:  06/11/2018  Patient Details  Name: Lauren Richard MRN: [id number] Date of Birth: Feb 26, 1936  Clinical Social Work is seeking post-discharge placement for this patient at the [place] level of care (*CSW will initial, date and re-position this form in  chart as items are completed):  Yes   Patient/family provided with [facility] Work Department's list of facilities offering this level of care within the geographic area requested by the patient (or if unable, by the patient's family).  Yes   Patient/family informed of their freedom to choose among providers that offer the needed level of care, that participate in Medicare, Medicaid or managed care program needed by the patient, have an available bed and are willing to accept the patient.      Patient/family informed of [facility]'s ownership interest in [facility] and [facility], as well as of the fact that they are under no obligation to receive care at these facilities.  PASRR submitted to EDS on       PASRR number received on       Existing PASRR number confirmed on 06/08/18     FL2 transmitted to all facilities in geographic area requested by pt/family on       FL2 transmitted to all facilities within larger geographic area on       Patient informed that his/her managed care company has contracts with or will negotiate with certain facilities, including the following:        Yes   Patient/family informed of bed offers received.  Patient chooses bed at [facility])     Physician recommends and patient chooses bed at      Patient to be transferred to [facility]) on 06/11/18.  Patient to be transferred to facility by [name])     Patient family notified on 06/11/18 of transfer.  Name of family member notified:  Baruch Goldmann (Niece) [phone]     PHYSICIAN       Additional Comment:     _______________________________________________ Eileen Stanford, LCSW 06/11/2018, 11:38 AM

## 2018-06-13 DIAGNOSIS — M542 Cervicalgia: Secondary | ICD-10-CM | POA: Diagnosis not present

## 2018-06-13 DIAGNOSIS — I249 Acute ischemic heart disease, unspecified: Secondary | ICD-10-CM | POA: Diagnosis not present

## 2018-06-13 DIAGNOSIS — C259 Malignant neoplasm of pancreas, unspecified: Secondary | ICD-10-CM | POA: Diagnosis not present

## 2018-06-13 DIAGNOSIS — N189 Chronic kidney disease, unspecified: Secondary | ICD-10-CM | POA: Diagnosis not present

## 2018-06-14 DIAGNOSIS — C259 Malignant neoplasm of pancreas, unspecified: Secondary | ICD-10-CM | POA: Diagnosis not present

## 2018-06-14 DIAGNOSIS — M545 Low back pain: Secondary | ICD-10-CM | POA: Diagnosis not present

## 2018-06-14 DIAGNOSIS — I249 Acute ischemic heart disease, unspecified: Secondary | ICD-10-CM | POA: Diagnosis not present

## 2018-06-14 DIAGNOSIS — M542 Cervicalgia: Secondary | ICD-10-CM | POA: Diagnosis not present

## 2018-06-14 DIAGNOSIS — M6281 Muscle weakness (generalized): Secondary | ICD-10-CM | POA: Diagnosis not present

## 2018-06-14 DIAGNOSIS — E876 Hypokalemia: Secondary | ICD-10-CM | POA: Diagnosis not present

## 2018-06-16 DIAGNOSIS — M545 Low back pain: Secondary | ICD-10-CM | POA: Diagnosis not present

## 2018-06-16 DIAGNOSIS — E8809 Other disorders of plasma-protein metabolism, not elsewhere classified: Secondary | ICD-10-CM | POA: Diagnosis not present

## 2018-06-16 DIAGNOSIS — C259 Malignant neoplasm of pancreas, unspecified: Secondary | ICD-10-CM | POA: Diagnosis not present

## 2018-06-16 DIAGNOSIS — M542 Cervicalgia: Secondary | ICD-10-CM | POA: Diagnosis not present

## 2018-06-16 DIAGNOSIS — E876 Hypokalemia: Secondary | ICD-10-CM | POA: Diagnosis not present

## 2018-06-16 DIAGNOSIS — M6281 Muscle weakness (generalized): Secondary | ICD-10-CM | POA: Diagnosis not present

## 2018-06-19 DIAGNOSIS — M25542 Pain in joints of left hand: Secondary | ICD-10-CM | POA: Diagnosis not present

## 2018-06-20 DIAGNOSIS — M25542 Pain in joints of left hand: Secondary | ICD-10-CM | POA: Diagnosis not present

## 2018-06-21 ENCOUNTER — Emergency Department (HOSPITAL_COMMUNITY): Payer: PPO

## 2018-06-21 ENCOUNTER — Emergency Department (HOSPITAL_COMMUNITY)
Admission: EM | Admit: 2018-06-21 | Discharge: 2018-06-21 | Disposition: A | Discharge status: Home / Self Care | Payer: PPO | Attending: Emergency Medicine | Admitting: Emergency Medicine

## 2018-06-21 ENCOUNTER — Encounter (HOSPITAL_COMMUNITY): Payer: Self-pay | Admitting: Emergency Medicine

## 2018-06-21 ENCOUNTER — Other Ambulatory Visit: Payer: Self-pay

## 2018-06-21 DIAGNOSIS — R278 Other lack of coordination: Secondary | ICD-10-CM | POA: Diagnosis not present

## 2018-06-21 DIAGNOSIS — I10 Essential (primary) hypertension: Secondary | ICD-10-CM | POA: Diagnosis not present

## 2018-06-21 DIAGNOSIS — R29810 Facial weakness: Secondary | ICD-10-CM

## 2018-06-21 DIAGNOSIS — E039 Hypothyroidism, unspecified: Secondary | ICD-10-CM | POA: Insufficient documentation

## 2018-06-21 DIAGNOSIS — R531 Weakness: Secondary | ICD-10-CM | POA: Diagnosis not present

## 2018-06-21 DIAGNOSIS — R4020 Unspecified coma: Secondary | ICD-10-CM | POA: Diagnosis not present

## 2018-06-21 DIAGNOSIS — R4781 Slurred speech: Secondary | ICD-10-CM

## 2018-06-21 DIAGNOSIS — G92 Toxic encephalopathy: Secondary | ICD-10-CM

## 2018-06-21 DIAGNOSIS — I6523 Occlusion and stenosis of bilateral carotid arteries: Secondary | ICD-10-CM | POA: Diagnosis not present

## 2018-06-21 DIAGNOSIS — Z79899 Other long term (current) drug therapy: Secondary | ICD-10-CM | POA: Diagnosis not present

## 2018-06-21 LAB — I-STAT CHEM 8, ED
BUN: 16 mg/dL (ref 8–23)
Calcium, Ion: 1.15 mmol/L (ref 1.15–1.40)
Chloride: 105 mmol/L (ref 98–111)
Creatinine, Ser: 1.2 mg/dL — ABNORMAL HIGH (ref 0.44–1.00)
Glucose, Bld: 85 mg/dL (ref 70–99)
HCT: 38 % (ref 36.0–46.0)
Hemoglobin: 12.9 g/dL (ref 12.0–15.0)
Potassium: 4.8 mmol/L (ref 3.5–5.1)
SODIUM: 137 mmol/L (ref 135–145)
TCO2: 24 mmol/L (ref 22–32)

## 2018-06-21 LAB — I-STAT TROPONIN, ED: TROPONIN I, POC: 0.05 ng/mL (ref 0.00–0.08)

## 2018-06-21 LAB — DIFFERENTIAL
ABS IMMATURE GRANULOCYTES: 0 10*3/uL (ref 0.0–0.1)
BASOS ABS: 0 10*3/uL (ref 0.0–0.1)
BASOS PCT: 0 %
EOS ABS: 0 10*3/uL (ref 0.0–0.7)
Eosinophils Relative: 0 %
IMMATURE GRANULOCYTES: 0 %
Lymphocytes Relative: 18 %
Lymphs Abs: 1.9 10*3/uL (ref 0.7–4.0)
MONOS PCT: 7 %
Monocytes Absolute: 0.7 10*3/uL (ref 0.1–1.0)
NEUTROS PCT: 75 %
Neutro Abs: 7.9 10*3/uL — ABNORMAL HIGH (ref 1.7–7.7)

## 2018-06-21 LAB — COMPREHENSIVE METABOLIC PANEL
ALBUMIN: 1.7 g/dL — AB (ref 3.5–5.0)
ALT: 30 U/L (ref 0–44)
AST: 81 U/L — AB (ref 15–41)
Alkaline Phosphatase: 251 U/L — ABNORMAL HIGH (ref 38–126)
Anion gap: 7 (ref 5–15)
BUN: 11 mg/dL (ref 8–23)
CHLORIDE: 107 mmol/L (ref 98–111)
CO2: 21 mmol/L — ABNORMAL LOW (ref 22–32)
Calcium: 8.7 mg/dL — ABNORMAL LOW (ref 8.9–10.3)
Creatinine, Ser: 1.26 mg/dL — ABNORMAL HIGH (ref 0.44–1.00)
GFR calc Af Amer: 45 mL/min — ABNORMAL LOW (ref >60)
GFR, EST NON AFRICAN AMERICAN: 39 mL/min — AB (ref >60)
Glucose, Bld: 88 mg/dL (ref 70–99)
POTASSIUM: 4.8 mmol/L (ref 3.5–5.1)
Sodium: 135 mmol/L (ref 135–145)
Total Bilirubin: 1.2 mg/dL (ref 0.3–1.2)
Total Protein: 7.3 g/dL (ref 6.5–8.1)

## 2018-06-21 LAB — APTT: aPTT: 44 seconds — ABNORMAL HIGH (ref 24–36)

## 2018-06-21 LAB — ETHANOL: Alcohol, Ethyl (B): <10 mg/dL (ref <10)

## 2018-06-21 LAB — CBG MONITORING, ED: GLUCOSE-CAPILLARY: 74 mg/dL (ref 70–99)

## 2018-06-21 LAB — CBC
HCT: 38.3 % (ref 36.0–46.0)
Hemoglobin: 12 g/dL (ref 12.0–15.0)
MCH: 31.6 pg (ref 26.0–34.0)
MCHC: 31.3 g/dL (ref 30.0–36.0)
MCV: 100.8 fL — ABNORMAL HIGH (ref 78.0–100.0)
PLATELETS: 417 10*3/uL — AB (ref 150–400)
RBC: 3.8 MIL/uL — AB (ref 3.87–5.11)
RDW: 14.1 % (ref 11.5–15.5)
WBC: 10.6 10*3/uL — ABNORMAL HIGH (ref 4.0–10.5)

## 2018-06-21 LAB — PROTIME-INR
INR: 3.81
PROTHROMBIN TIME: 37.2 s — AB (ref 11.4–15.2)

## 2018-06-21 MED ORDER — IOPAMIDOL (ISOVUE-370) INJECTION 76%
50.0000 mL | Freq: Once | INTRAVENOUS | Status: AC | PRN
  Administered 2018-06-21: 50 mL via INTRAVENOUS

## 2018-06-21 MED ORDER — IOPAMIDOL (ISOVUE-370) INJECTION 76%
INTRAVENOUS | Status: AC
  Filled 2018-06-21: qty 50

## 2018-06-21 NOTE — ED Provider Notes (Addendum)
[facility] EMERGENCY DEPARTMENT Provider Note   CSN: [id number] Arrival date & time: 06/21/18  1326   An emergency department physician performed an initial assessment on this suspected stroke patient at 1329.  History   Chief Complaint Chief Complaint  Patient presents with  . Code Stroke    HPI Lauren Richard is a 82 y.o. female.  Patient brought in by EMS from [facility].  Patient was receiving physical therapy this morning when she was noted to have a right-sided facial droop and slurred speech.  EMS reported last seen normal at 9:30 AM.  Code stroke was activated by EMS.  Blood sugar was 95.  Blood pressure systolic was 191.  Patient is on the blood thinner Xarelto.  And is taking it.  Patient's past medical history is complicated by history of pancreatic cancer status post Whipple procedure.  Past medical history is also significant for coronary artery disease.  Hypokalemia hypomagnesia generalized weakness protein malnutrition adenocarcinoma the pancreas is mentioned before and a pulmonary embolism.  Patient is also taking Plavix along with his Xarelto.  Patient was received to bridge as a code stroke was met by myself and the stroke team including the neuro hospitalist Dr. Malen Gauze     Past Medical History:  Diagnosis Date  . Adenocarcinoma of head of pancreas ([place]) 11/24/2017  . Arthritis    "all over" (04/26/2017); knees, shoulder  . Essential hypertension 03/03/2017  . Family history of breast cancer   . Family history of kidney cancer   . Family history of pancreatic cancer   . Family history of prostate cancer   . History of blood transfusion 1979   "when I had my hysterectomy"  . Hyperlipidemia   . Hypothyroidism   . Obesity 03/03/2017  . Osteoarthritis 03/03/2017  . Pneumonia    as a child    Patient Active Problem List   Diagnosis Date Noted  . Tachycardia 06/05/2018  . Elevated troponin 06/05/2018  . Dyspnea 06/05/2018  .  Abnormal liver function 06/05/2018  . Genetic testing 05/05/2018  . Malnutrition of moderate degree 04/25/2018  . Malignant neoplasm of head of pancreas ([place])   . Heme positive stool   . Syncope 04/23/2018  . Syncope, near 04/22/2018  . Pancreatic cancer metastasized to liver ([place]) 04/22/2018  . Generalized weakness 03/29/2018  . Nausea & vomiting 03/29/2018  . Diarrhea 03/29/2018  . Chemotherapy induced neutropenia ([place]) 03/23/2018  . Family history of pancreatic cancer   . Family history of breast cancer   . Family history of kidney cancer   . Family history of prostate cancer   . Goals of care, counseling/discussion 03/09/2018  . Acute pulmonary embolism ([place]) 03/09/2018  . S/P laparoscopy 01/05/2018  . Adenocarcinoma of head of pancreas ([place]) 11/24/2017  . Acute kidney injury superimposed on chronic kidney disease ([place])   . Hypokalemia   . Anemia   . Biliary obstruction   . Acute kidney injury ([place]) 11/03/2017  . Pancreatic mass 11/03/2017  . Osteoarthritis of left knee 04/26/2017  . S/P total knee replacement using cement, left 04/26/2017  . Essential hypertension 03/03/2017  . Hyperlipidemia 03/03/2017  . Osteoarthritis 03/03/2017  . Obesity 03/03/2017  . Status post left foot surgery 12/03/2015  . Hammertoe 11/03/2015    Past Surgical History:  Procedure Laterality Date  . BREAST CYST EXCISION Right    "cut 6 cysts out"  . CYST EXCISION Left    "off my toe"  . DILATION AND  CURETTAGE OF UTERUS     "before hysterectomy"  . ERCP N/A 11/04/2017   Procedure: ENDOSCOPIC RETROGRADE CHOLANGIOPANCREATOGRAPHY (ERCP);  Surgeon: Carol Ada, MD;  Location: [facility];  Service: Endoscopy;  Laterality: N/A;  . ESOPHAGOGASTRODUODENOSCOPY N/A 04/24/2018   Procedure: ESOPHAGOGASTRODUODENOSCOPY (EGD);  Surgeon: Jerene Bears, MD;  Location: [facility];  Service: Gastroenterology;  Laterality: N/A;  . EUS N/A 11/11/2017   Procedure: UPPER ENDOSCOPIC ULTRASOUND (EUS) LINEAR;   Surgeon: Carol Ada, MD;  Location: [facility];  Service: Endoscopy;  Laterality: N/A;  . FINE NEEDLE ASPIRATION N/A 11/11/2017   Procedure: FINE NEEDLE ASPIRATION (FNA) LINEAR;  Surgeon: Carol Ada, MD;  Location: [facility];  Service: Endoscopy;  Laterality: N/A;  . INGUINAL HERNIA REPAIR Right   . LAPAROSCOPY N/A 01/05/2018   Procedure: LAPAROSCOPY DIAGNOSTIC ERAS PATHWAY;  Surgeon: Stark Klein, MD;  Location: [facility];  Service: General;  Laterality: N/A;  [place]  . PORTACATH PLACEMENT Left 04/11/2018   Procedure: INSERTION PORT-A-CATH;  Surgeon: Stark Klein, MD;  Location: [facility];  Service: General;  Laterality: Left;  . REDUCTION MAMMAPLASTY Bilateral 1980  . TONSILLECTOMY     "when I small"  . TOTAL ABDOMINAL HYSTERECTOMY  1979  . TOTAL KNEE ARTHROPLASTY Left 04/26/2017   Procedure: LEFT TOTAL KNEE ARTHROPLASTY;  Surgeon: [name], MD;  Location: [facility];  Service: Orthopedics;  Laterality: Left;  . WHIPPLE PROCEDURE N/A 01/05/2018   Procedure: WHIPPLE PROCEDURE;  Surgeon: Stark Klein, MD;  Location: [facility];  Service: General;  Laterality: N/A;     OB History   None      Home Medications    Prior to Admission medications   Medication Sig Start Date End Date Taking? Authorizing Provider  acetaminophen (TYLENOL) 500 MG tablet Take 500 mg by mouth every 12 (twelve) hours.   Yes [provider]  clopidogrel (PLAVIX) 75 MG tablet Take 1 tablet (75 mg total) by mouth daily. 06/11/18  Yes Thurnell Lose, MD  gabapentin (NEURONTIN) 100 MG capsule Take 2 capsules (200 mg total) by mouth 2 (two) times daily. 01/13/18  Yes Stark Klein, MD  Lidocaine (ASPERCREME LIDOCAINE) 4 % PTCH Apply 1 patch topically every 12 (twelve) hours. Lower back   Yes [provider]  lidocaine-prilocaine (EMLA) cream Apply 1 application topically as needed. 04/14/18  Yes Truitt Merle, MD  magnesium oxide (MAG-OX) 400 MG tablet Take 400 mg by mouth daily.   Yes [provider]  metoprolol tartrate (LOPRESSOR) 25 MG tablet Take 0.5 tablets (12.5 mg total) by mouth 2 (two) times daily. 06/10/18  Yes Thurnell Lose, MD  mirtazapine (REMERON) 7.5 MG tablet Take 1 tablet (7.5 mg total) by mouth at bedtime. 05/23/18  Yes Truitt Merle, MD  nystatin (MYCOSTATIN) 100000 UNIT/ML suspension Take 5 mLs (500,000 Units total) by mouth 4 (four) times daily. 03/24/18  Yes Alla Feeling, NP  oxyCODONE (OXY IR/ROXICODONE) 5 MG immediate release tablet Take 1-2 tablets (5-10 mg total) by mouth every 6 (six) hours as needed for severe pain. 06/10/18  Yes Thurnell Lose, MD  pantoprazole (PROTONIX) 40 MG tablet Take 1 tablet (40 mg total) by mouth daily at 12 noon. Patient taking differently: Take 40 mg by mouth daily.  01/13/18  Yes Stark Klein, MD  potassium chloride SA (K-DUR,KLOR-CON) 20 MEQ tablet Take 2 tablets (40 mEq total) by mouth daily. 04/20/18  Yes Truitt Merle, MD  rivaroxaban (XARELTO) 20 MG TABS tablet Take 1 tablet (20 mg total) by mouth  daily with supper. 04/04/18  Yes Sheikh, Omair Latif, DO  rosuvastatin (CRESTOR) 20 MG tablet Take 1 tablet (20 mg total) by mouth daily at 6 PM. 06/10/18  Yes Thurnell Lose, MD  traMADol (ULTRAM) 50 MG tablet Take 1 tablet (50 mg total) by mouth every 12 (twelve) hours as needed (for pain.). Patient taking differently: Take 50 mg by mouth every 8 (eight) hours. Hold if sleepy or confused 06/10/18  Yes Thurnell Lose, MD  trolamine salicylate (ASPERCREME) 10 % cream Apply 1 application topically 4 (four) times daily as needed (for knee pain/muscle pain.).    Yes [provider]  capecitabine (XELODA) 500 MG tablet Take 2 tablets (1060m) by mouth 2 times daily, within 30 min of finishing food. Take on days 1-7 and 15-21 of each 28 day cycle Patient not taking: Reported on 06/21/2018 05/01/18   FTruitt Merle MD  feeding supplement, ENSURE ENLIVE, (ENSURE ENLIVE) LIQD Take 237 mLs by mouth 3 (three) times daily between meals. Patient not  taking: Reported on 06/21/2018 04/25/18   NMariel Aloe MD    Family History Family History  Problem Relation Age of Onset  . Kidney cancer Mother 819 . Lung cancer Father 770      lung, smoker  . Congestive Heart Failure Sister   . Asthma Sister   . Atrial fibrillation Sister   . Hypertension Sister   . Diabetes Sister   . Head & neck cancer Paternal Grandmother   . Cancer Maternal Uncle        type unk  . Pancreatic cancer Paternal Aunt        dx >50  . Cancer Paternal Uncle        type unk  . Breast cancer Cousin 777 . Prostate cancer Other 62       had robotic surgery    Social History Social History   Tobacco Use  . Smoking status: Never Smoker  . Smokeless tobacco: Never Used  Substance Use Topics  . Alcohol use: No    Alcohol/week: 0.0 standard drinks    Frequency: Never    Comment: occasional glass of wine   . Drug use: No     Allergies   Patient has no known allergies.   Review of Systems Review of Systems  Unable to perform ROS: Acuity of condition     Physical Exam Updated Vital Signs BP 125/83   Pulse 91   Temp 97.6 F (36.4 C)   Resp 17   Ht 1.702 m (5' 7" )   Wt 68 kg   SpO2 99%   BMI 23.49 kg/m   Physical Exam  Constitutional: She appears well-developed and well-nourished. No distress.  HENT:  Head: Normocephalic and atraumatic.  Mouth/Throat: Oropharynx is clear and moist.  Eyes: Pupils are equal, round, and reactive to light. Conjunctivae and EOM are normal.  Neck: Neck supple.  Cardiovascular: Normal rate, regular rhythm and normal heart sounds.  Pulmonary/Chest: Effort normal and breath sounds normal. No respiratory distress.  Abdominal: Soft. Bowel sounds are normal.  Musculoskeletal: Normal range of motion.  Neurological: She is alert.  Patient with some right-sided facial droop.  Also had some drifting of the left upper extremity and left lower extremity.  But no significant weakness.  And perhaps some dysarthria.    Skin: Skin is warm.  Nursing note and vitals reviewed.    ED Treatments / Results  Labs (all labs ordered are listed, but only abnormal results  are displayed) Labs Reviewed  PROTIME-INR - Abnormal; Notable for the following components:      Result Value   Prothrombin Time 37.2 (*)    All other components within normal limits  APTT - Abnormal; Notable for the following components:   aPTT 44 (*)    All other components within normal limits  CBC - Abnormal; Notable for the following components:   WBC 10.6 (*)    RBC 3.80 (*)    MCV 100.8 (*)    Platelets 417 (*)    All other components within normal limits  DIFFERENTIAL - Abnormal; Notable for the following components:   Neutro Abs 7.9 (*)    All other components within normal limits  COMPREHENSIVE METABOLIC PANEL - Abnormal; Notable for the following components:   CO2 21 (*)    Creatinine, Ser 1.26 (*)    Calcium 8.7 (*)    Albumin 1.7 (*)    AST 81 (*)    Alkaline Phosphatase 251 (*)    GFR calc non Af Amer 39 (*)    GFR calc Af Amer 45 (*)    All other components within normal limits  I-STAT CHEM 8, ED - Abnormal; Notable for the following components:   Creatinine, Ser 1.20 (*)    All other components within normal limits  ETHANOL  RAPID URINE DRUG SCREEN, HOSP PERFORMED  URINALYSIS, ROUTINE W REFLEX MICROSCOPIC  I-STAT TROPONIN, ED  CBG MONITORING, ED    EKG None  Radiology Ct Angio Head W Or Richard Contrast  Result Date: 06/21/2018 CLINICAL DATA:  Left facial droop, slurred speech. History of pancreatic cancer. Suspect cerebral hemorrhage. EXAM: CT ANGIOGRAPHY HEAD AND NECK TECHNIQUE: Multidetector CT imaging of the head and neck was performed using the standard protocol during bolus administration of intravenous contrast. Multiplanar CT image reconstructions and MIPs were obtained to evaluate the vascular anatomy. Carotid stenosis measurements (when applicable) are obtained utilizing NASCET criteria, using the  distal internal carotid diameter as the denominator. CONTRAST:  69m ISOVUE-370 IOPAMIDOL (ISOVUE-370) INJECTION 76% COMPARISON:  CT head 06/21/2018 FINDINGS: CTA NECK FINDINGS Aortic arch: Atherosclerotic calcification aortic arch and proximal great vessels. Negative for aneurysm or stenosis. Right carotid system: Mild atherosclerotic calcification right carotid bifurcation without significant stenosis. Left carotid system: Mild atherosclerotic disease left carotid bifurcation without significant stenosis. Vertebral arteries: Both vertebral arteries are patent to the basilar. Moderate stenosis distal right vertebral artery. Skeleton: Cervical spondylosis.  No acute skeletal abnormality. Other neck: Thyroid goiter. Negative for mass or adenopathy in the neck. Upper chest: Negative Review of the MIP images confirms the above findings CTA HEAD FINDINGS Anterior circulation: Moderate stenosis left cavernous carotid due to atherosclerotic calcification. Mild calcification right cavernous carotid without stenosis. Anterior and middle cerebral arteries patent bilaterally without stenosis or aneurysm. Posterior circulation: Moderately severe stenosis distal right vertebral artery at the level the dura due to calcific plaque. Mild stenosis distal left vertebral artery. PICA patent bilaterally. Basilar widely patent. Superior cerebellar and posterior cerebral arteries patent bilaterally without stenosis or aneurysm. Venous sinuses: Not significantly opacified due to timing of the scan. Anatomic variants: None Delayed phase: Not performed Review of the MIP images confirms the above findings IMPRESSION: Mild atherosclerotic disease of the carotid bifurcation bilaterally without significant stenosis Moderate stenosis distal right vertebral artery, mild stenosis distal left vertebral artery. Negative for emergent large vessel occlusion. Electronically Signed   By: CFranchot GalloM.D.   On: 06/21/2018 14:06   Ct Angio Neck W Or  Richard  Contrast  Result Date: 06/21/2018 CLINICAL DATA:  Left facial droop, slurred speech. History of pancreatic cancer. Suspect cerebral hemorrhage. EXAM: CT ANGIOGRAPHY HEAD AND NECK TECHNIQUE: Multidetector CT imaging of the head and neck was performed using the standard protocol during bolus administration of intravenous contrast. Multiplanar CT image reconstructions and MIPs were obtained to evaluate the vascular anatomy. Carotid stenosis measurements (when applicable) are obtained utilizing NASCET criteria, using the distal internal carotid diameter as the denominator. CONTRAST:  45m ISOVUE-370 IOPAMIDOL (ISOVUE-370) INJECTION 76% COMPARISON:  CT head 06/21/2018 FINDINGS: CTA NECK FINDINGS Aortic arch: Atherosclerotic calcification aortic arch and proximal great vessels. Negative for aneurysm or stenosis. Right carotid system: Mild atherosclerotic calcification right carotid bifurcation without significant stenosis. Left carotid system: Mild atherosclerotic disease left carotid bifurcation without significant stenosis. Vertebral arteries: Both vertebral arteries are patent to the basilar. Moderate stenosis distal right vertebral artery. Skeleton: Cervical spondylosis.  No acute skeletal abnormality. Other neck: Thyroid goiter. Negative for mass or adenopathy in the neck. Upper chest: Negative Review of the MIP images confirms the above findings CTA HEAD FINDINGS Anterior circulation: Moderate stenosis left cavernous carotid due to atherosclerotic calcification. Mild calcification right cavernous carotid without stenosis. Anterior and middle cerebral arteries patent bilaterally without stenosis or aneurysm. Posterior circulation: Moderately severe stenosis distal right vertebral artery at the level the dura due to calcific plaque. Mild stenosis distal left vertebral artery. PICA patent bilaterally. Basilar widely patent. Superior cerebellar and posterior cerebral arteries patent bilaterally without stenosis or  aneurysm. Venous sinuses: Not significantly opacified due to timing of the scan. Anatomic variants: None Delayed phase: Not performed Review of the MIP images confirms the above findings IMPRESSION: Mild atherosclerotic disease of the carotid bifurcation bilaterally without significant stenosis Moderate stenosis distal right vertebral artery, mild stenosis distal left vertebral artery. Negative for emergent large vessel occlusion. Electronically Signed   By: CFranchot GalloM.D.   On: 06/21/2018 14:06   Lauren Richard Contrast (neuro Protocol)  Result Date: 06/21/2018 CLINICAL DATA:  Right-sided weakness beginning today. Facial droop. Atrial fibrillation. EXAM: MRI HEAD WITHOUT CONTRAST TECHNIQUE: Multiplanar, multiecho pulse sequences of the brain and surrounding structures were obtained without intravenous contrast. COMPARISON:  CT same day FINDINGS: Brain: The examination suffers from motion degradation. Diffusion imaging does not show any acute or subacute infarction. The brainstem and cerebellum are normal. Cerebral hemispheres show mild age related atrophy. No sign of old infarction. No mass lesion, hemorrhage, hydrocephalus or extra-axial collection. Vascular: Major vessels at the base of the brain show flow. Skull and upper cervical spine: Negative Sinuses/Orbits: Clear/unremarkable Other: None IMPRESSION: Motion degraded study. No acute or significant finding. Mild age related atrophy. Electronically Signed   By: MNelson ChimesM.D.   On: 06/21/2018 15:47   Ct Head Code Stroke Richard Contrast  Result Date: 06/21/2018 CLINICAL DATA:  Code stroke. Altered level of consciousness. Left facial droop. Slurred speech. EXAM: CT HEAD WITHOUT CONTRAST TECHNIQUE: Contiguous axial images were obtained from the base of the skull through the vertex without intravenous contrast. COMPARISON:  CT head 06/05/2018 FINDINGS: Brain: Mild atrophy is unchanged. Negative for acute infarct, hemorrhage, mass. Mild chronic  microvascular ischemia in the white matter. Vascular: Negative for hyperdense vessel. Atherosclerotic calcification at the skull base. Skull: Negative Sinuses/Orbits: Negative Other: None ASPECTS (AWhite CenterStroke Program Early CT Score) - Ganglionic level infarction (caudate, lentiform nuclei, internal capsule, insula, M1-M3 cortex): 7 - Supraganglionic infarction (M4-M6 cortex): 3 Total score (0-10 with 10 being normal): 10 IMPRESSION: 1. No acute  abnormality 2. ASPECTS is 10 3. These results were called by telephone at the time of interpretation on 06/21/2018 at 1:44 pm to Dr. Rory Percy , who verbally acknowledged these results. Electronically Signed   By: Franchot Gallo M.D.   On: 06/21/2018 13:44    Procedures Procedures (including critical care time)  CRITICAL CARE Performed by: Fredia Sorrow Total critical care time: 30 minutes Critical care time was exclusive of separately billable procedures and treating other patients. Critical care was necessary to treat or prevent imminent or life-threatening deterioration. Critical care was time spent personally by me on the following activities: development of treatment plan with patient and/or surrogate as well as nursing, discussions with consultants, evaluation of patient's response to treatment, examination of patient, obtaining history from patient or surrogate, ordering and performing treatments and interventions, ordering and review of laboratory studies, ordering and review of radiographic studies, pulse oximetry and re-evaluation of patient's condition.   Medications Ordered in ED Medications  iopamidol (ISOVUE-370) 76 % injection (has no administration in time range)  iopamidol (ISOVUE-370) 76 % injection 50 mL (50 mLs Intravenous Contrast Given 06/21/18 1347)     Initial Impression / Assessment and Plan / ED Course  I have reviewed the triage vital signs and the nursing notes.  Pertinent labs & imaging results that were available during my  care of the patient were reviewed by me and considered in my medical decision making (see chart for details).    Patient treated as a code stroke.  However due to being on Xarelto so not a candidate for TPA.  No real vin symptoms.  Patient seen by the neuro hospitalist CT of head without any acute findings.  MRI and MRI was negative patient could be discharged back to [facility].  Their clinical impression before MRI was that this was not consistent with a stroke.  NIH score was 8 or 9.  Patient's MRI was negative still had a little bit of facial droop.  As per neuro hospitalist stable for discharge back.  In addition CT angios head neck also without any acute findings consistent with stroke.  Labs without any significant changes.   Final Clinical Impressions(s) / ED Diagnoses   Final diagnoses:  Weakness  Facial droop    ED Discharge Orders    None       Fredia Sorrow, MD 06/21/18 1736    Fredia Sorrow, MD 06/21/18 1740

## 2018-06-21 NOTE — ED Triage Notes (Signed)
Pt arrives via EMS from [facility] where patient was receiving physical therapy this morning when she was noted to have right sided facial droop and slurred speech. LSN 930 am, code stroke activated by EMS. cbg 95, b 132/100 in transport, SR on monitor. Takes xarelto.

## 2018-06-21 NOTE — ED Notes (Signed)
Report called to [facility] and given to [place], [place]

## 2018-06-21 NOTE — Code Documentation (Signed)
82 year old female presents to [facility] as code stroke that was called in the field.  She is currently in facility where she had PT this AM.  Around 0930 staff noticed right facial droop and slurred speech.  On arrival she is alert - complains of right leg pain - has mild dysarthria and mild facial droop.  She is on Xarelto and took her dose last pm. Met at bridge by stroke team - to CT scan. She cannot resist gravity with both legs - right painful - can bend left leg - she states this is normal.   NIHSS 9.  No LVO.  CT-A completed and back to ED.  Dr. Rory Percy at bedside - no acute treatment - out of window.  Handoff to [employer].

## 2018-06-21 NOTE — Consult Note (Addendum)
NEURO HOSPITALIST CONSULT NOTE  Requesting Physician: Dr. Rogene Houston Chief Complaint: facial droop/ right side weakness History obtained from:  Patient    HPI:                                                                                                                                      Lauren Richard is an 82 y.o. female PMH of stage III metastatic adenocarcinoma of head of pancreas, HLD, HTN, afib ( xarelto) who presented to [facility] ED via EMS as a code stroke for right facial droop and right sided weakness.   Per EMS patient is coming from [place] was [facility]KW @ 0930. Per EMS the facility had to wait for a physician before the facility could call EMS. Patient stated that her right side was a little more weak, but had resolved by the time she reached the ED. She does endorse a facial droop. She was also just here last month for afib and placed on xarelto at that time. Denies any CP, SOB, HA, vision changes. Patient is wheelchair bound at baseline. She does not walk and is incontinent. ED course:  BP: 132/100, BG: 95 stat CTH and CTA head and neck obtained.  No prior stroke history.  Date last known well: Date: 06/21/2018 Time last known well: Time: 09:30 tPA Given: No: contraindicated; on xarelto Modified Rankin: Rankin Score=4 NIHSS:9 1a Level of Conscious:0 1b LOC Questions: 0 1c LOC Commands: 0 2 Best Gaze: 0 3 Visual: 0 4 Facial Palsy: 1 5a Motor Arm - left: 0 5b Motor Arm - Right: 0 6a Motor Leg - Left: 3 6b Motor Leg - Right: 3 7 Limb Ataxia: 0 8 Sensory:1 9 Best Language: 0 10 Dysarthria:1 11 Extinct. and Inattention:0 TOTAL: 8  Past Medical History:  Diagnosis Date  . Adenocarcinoma of head of pancreas ([place]) 11/24/2017  . Arthritis    "all over" (04/26/2017); knees, shoulder  . Essential hypertension 03/03/2017  . Family history of breast cancer   . Family history of kidney cancer   . Family history of pancreatic cancer   .  Family history of prostate cancer   . History of blood transfusion 1979   "when I had my hysterectomy"  . Hyperlipidemia   . Hypothyroidism   . Obesity 03/03/2017  . Osteoarthritis 03/03/2017  . Pneumonia    as a child    Past Surgical History:  Procedure Laterality Date  . BREAST CYST EXCISION Right    "cut 6 cysts out"  . CYST EXCISION Left    "off my toe"  . DILATION AND CURETTAGE OF UTERUS     "before hysterectomy"  .  ERCP N/A 11/04/2017   Procedure: ENDOSCOPIC RETROGRADE CHOLANGIOPANCREATOGRAPHY (ERCP);  Surgeon: Carol Ada, MD;  Location: [facility];  Service: Endoscopy;  Laterality: N/A;  . ESOPHAGOGASTRODUODENOSCOPY N/A 04/24/2018   Procedure: ESOPHAGOGASTRODUODENOSCOPY (EGD);  Surgeon: Jerene Bears, MD;  Location: [facility];  Service: Gastroenterology;  Laterality: N/A;  . EUS N/A 11/11/2017   Procedure: UPPER ENDOSCOPIC ULTRASOUND (EUS) LINEAR;  Surgeon: Carol Ada, MD;  Location: [facility];  Service: Endoscopy;  Laterality: N/A;  . FINE NEEDLE ASPIRATION N/A 11/11/2017   Procedure: FINE NEEDLE ASPIRATION (FNA) LINEAR;  Surgeon: Carol Ada, MD;  Location: [facility];  Service: Endoscopy;  Laterality: N/A;  . INGUINAL HERNIA REPAIR Right   . LAPAROSCOPY N/A 01/05/2018   Procedure: LAPAROSCOPY DIAGNOSTIC ERAS PATHWAY;  Surgeon: Stark Klein, MD;  Location: [place];  Service: General;  Laterality: N/A;  [place]  . PORTACATH PLACEMENT Left 04/11/2018   Procedure: INSERTION PORT-A-CATH;  Surgeon: Stark Klein, MD;  Location: [facility];  Service: General;  Laterality: Left;  . REDUCTION MAMMAPLASTY Bilateral 1980  . TONSILLECTOMY     "when I small"  . TOTAL ABDOMINAL HYSTERECTOMY  1979  . TOTAL KNEE ARTHROPLASTY Left 04/26/2017   Procedure: LEFT TOTAL KNEE ARTHROPLASTY;  Surgeon: [name], MD;  Location: [place];  Service: Orthopedics;  Laterality: Left;  . WHIPPLE PROCEDURE N/A 01/05/2018   Procedure: WHIPPLE PROCEDURE;  Surgeon: Stark Klein, MD;  Location:  [facility] OR;  Service: General;  Laterality: N/A;    Family History  Problem Relation Age of Onset  . Kidney cancer Mother 64  . Lung cancer Father 53       lung, smoker  . Congestive Heart Failure Sister   . Asthma Sister   . Atrial fibrillation Sister   . Hypertension Sister   . Diabetes Sister   . Head & neck cancer Paternal Grandmother   . Cancer Maternal Uncle        type unk  . Pancreatic cancer Paternal Aunt        dx >50  . Cancer Paternal Uncle        type unk  . Breast cancer Cousin 22  . Prostate cancer Other 49       had robotic surgery         Social History:  reports that she has never smoked. She has never used smokeless tobacco. She reports that she does not drink alcohol or use drugs.  Allergies: No Known Allergies  Medications:                                                                                                                           Current Facility-Administered Medications  Medication Dose Route Frequency Provider Last Rate Last Dose  . iopamidol (ISOVUE-370) 76 % injection            Current Outpatient Medications  Medication Sig Dispense Refill  . capecitabine (XELODA) 500 MG tablet Take 2 tablets (1000mg ) by mouth 2 times  daily, within 30 min of finishing food. Take on days 1-7 and 15-21 of each 28 day cycle 56 tablet 0  . clopidogrel (PLAVIX) 75 MG tablet Take 1 tablet (75 mg total) by mouth daily.    . feeding supplement, ENSURE ENLIVE, (ENSURE ENLIVE) LIQD Take 237 mLs by mouth 3 (three) times daily between meals. 90 Bottle 0  . gabapentin (NEURONTIN) 100 MG capsule Take 2 capsules (200 mg total) by mouth 2 (two) times daily. 30 capsule 0  . lidocaine-prilocaine (EMLA) cream Apply 1 application topically as needed. 30 g 1  . metoprolol tartrate (LOPRESSOR) 25 MG tablet Take 0.5 tablets (12.5 mg total) by mouth 2 (two) times daily.    . mirtazapine (REMERON) 7.5 MG tablet Take 1 tablet (7.5 mg total) by mouth at bedtime. 30 tablet 1  .  nystatin (MYCOSTATIN) 100000 UNIT/ML suspension Take 5 mLs (500,000 Units total) by mouth 4 (four) times daily. (Patient not taking: Reported on 06/05/2018) 100 mL 0  . ondansetron (ZOFRAN) 8 MG tablet Take 8 mg by mouth 2 (two) times daily as needed for nausea/vomiting.  1  . oxyCODONE (OXY IR/ROXICODONE) 5 MG immediate release tablet Take 1-2 tablets (5-10 mg total) by mouth every 6 (six) hours as needed for severe pain. 15 tablet 0  . pantoprazole (PROTONIX) 40 MG tablet Take 1 tablet (40 mg total) by mouth daily at 12 noon. (Patient taking differently: Take 40 mg by mouth daily as needed (heartburn). ) 30 tablet 0  . potassium chloride SA (K-DUR,KLOR-CON) 20 MEQ tablet Take 2 tablets (40 mEq total) by mouth daily. (Patient taking differently: Take 60 mEq by mouth daily. ) 60 tablet 2  . PRESCRIPTION MEDICATION Apply 1 application topically 4 (four) times daily as needed (for back pain). Dic 3%, Bac 2%, lid - pril 2-5%, Menth 1% cream     . prochlorperazine (COMPAZINE) 10 MG tablet Take 10 mg by mouth every 6 (six) hours as needed for nausea or vomiting.    . rivaroxaban (XARELTO) 20 MG TABS tablet Take 1 tablet (20 mg total) by mouth daily with supper. 30 tablet 0  . rosuvastatin (CRESTOR) 20 MG tablet Take 1 tablet (20 mg total) by mouth daily at 6 PM.    . traMADol (ULTRAM) 50 MG tablet Take 1 tablet (50 mg total) by mouth every 12 (twelve) hours as needed (for pain.). 15 tablet 0  . trolamine salicylate (ASPERCREME) 10 % cream Apply 1 application topically 4 (four) times daily as needed (for knee pain/muscle pain.).      Facility-Administered Medications Ordered in Other Encounters  Medication Dose Route Frequency Provider Last Rate Last Dose  . potassium chloride SA (K-DUR,KLOR-CON) CR tablet 40 mEq  40 mEq Oral Once Truitt Merle, MD         ROS:  Review of system  was performed and is negative except as mentioned in HPI  General Examination:                                                                                                      There were no vitals taken for this visit.  HEENT-  Normocephalic, no lesions, without obvious abnormality.  Normal external eye and conjunctiva.  Cardiovascular- S1-S2 audible, pulses palpable throughout   Lungs-no rhonchi or wheezing noted, no excessive working breathing.  Saturations within normal limits on RA Extremities- Warm, dry and intact Musculoskeletal-no joint tenderness, deformity or swelling Skin-warm and dry, no hyperpigmentation, vitiligo, or suspicious lesions  Neurological Examination Mental Status: Alert, oriented, thought content appropriate.  Speech fluent without evidence of aphasia.  Mild dysarthria noted. Able to follow 3 step commands without difficulty. Cranial Nerves: II:  Visual fields grossly normal,  III,IV, VI: ptosis not present, extra-ocular motions intact bilaterally, pupils equal, round, reactive to light and accommodation V,VII: smile symmetric, facial light touch sensation normal bilaterally VIII: hearing normal bilaterally IX,X: uvula rises symmetrically XI: bilateral shoulder shrug XII: midline tongue extension Motor: Right : Upper extremity   5/5  Left:     Upper extremity   5/5  Lower extremity   3/5 limited by pain  lower extremity   3/5-limited by pain Tone and bulk:normal tone throughout; no atrophy noted Sensory: Pinprick and light touch intact throughout, bilaterally, though patient states on left arm pinprick sensation is decreased. Deep Tendon Reflexes: 2+ and symmetric biceps, and patella omitted d/t swelling and pain in right knee and recent surgery in left knee Plantars: Right: downgoing   Left: downgoing Cerebellar: normal finger-to-nose,  heel-to-shin test Gait: deferred   Lab Results: Basic Metabolic Panel: Recent Labs  Lab 06/21/18 1328  06/21/18 1340  NA 135 137  K 4.8 4.8  CL 107 105  CO2 21*  --   GLUCOSE 88 85  BUN 11 16  CREATININE 1.26* 1.20*  CALCIUM 8.7*  --    CBC: Recent Labs  Lab 06/21/18 1328 06/21/18 1340  WBC 10.6*  --   NEUTROABS 7.9*  --   HGB 12.0 12.9  HCT 38.3 38.0  MCV 100.8*  --   PLT 417*  --    CBG: Recent Labs  Lab 06/21/18 1331  GLUCAP 74   Imaging: Ct Angio Head W Or Wo Contrast  Result Date: 06/21/2018 CLINICAL DATA:  Left facial droop, slurred speech. History of pancreatic cancer. Suspect cerebral hemorrhage. EXAM: CT ANGIOGRAPHY HEAD AND NECK TECHNIQUE: Multidetector CT imaging of the head and neck was performed using the standard protocol during bolus administration of intravenous contrast. Multiplanar CT image reconstructions and MIPs were obtained to evaluate the vascular anatomy. Carotid stenosis measurements (when applicable) are obtained utilizing NASCET criteria, using the distal internal carotid diameter as the denominator. CONTRAST:  55mL ISOVUE-370 IOPAMIDOL (ISOVUE-370) INJECTION 76% COMPARISON:  CT head 06/21/2018 FINDINGS: CTA NECK FINDINGS Aortic arch: Atherosclerotic calcification aortic arch and proximal great vessels. Negative for aneurysm or stenosis. Right carotid system: Mild atherosclerotic calcification right  carotid bifurcation without significant stenosis. Left carotid system: Mild atherosclerotic disease left carotid bifurcation without significant stenosis. Vertebral arteries: Both vertebral arteries are patent to the basilar. Moderate stenosis distal right vertebral artery. Skeleton: Cervical spondylosis.  No acute skeletal abnormality. Other neck: Thyroid goiter. Negative for mass or adenopathy in the neck. Upper chest: Negative Review of the MIP images confirms the above findings CTA HEAD FINDINGS Anterior circulation: Moderate stenosis left cavernous carotid due to atherosclerotic calcification. Mild calcification right cavernous carotid without stenosis.  Anterior and middle cerebral arteries patent bilaterally without stenosis or aneurysm. Posterior circulation: Moderately severe stenosis distal right vertebral artery at the level the dura due to calcific plaque. Mild stenosis distal left vertebral artery. PICA patent bilaterally. Basilar widely patent. Superior cerebellar and posterior cerebral arteries patent bilaterally without stenosis or aneurysm. Venous sinuses: Not significantly opacified due to timing of the scan. Anatomic variants: None Delayed phase: Not performed Review of the MIP images confirms the above findings IMPRESSION: Mild atherosclerotic disease of the carotid bifurcation bilaterally without significant stenosis Moderate stenosis distal right vertebral artery, mild stenosis distal left vertebral artery. Negative for emergent large vessel occlusion. Electronically Signed   By: Franchot Gallo M.D.   On: 06/21/2018 14:06   Ct Angio Neck W Or Wo Contrast  Result Date: 06/21/2018 CLINICAL DATA:  Left facial droop, slurred speech. History of pancreatic cancer. Suspect cerebral hemorrhage. EXAM: CT ANGIOGRAPHY HEAD AND NECK TECHNIQUE: Multidetector CT imaging of the head and neck was performed using the standard protocol during bolus administration of intravenous contrast. Multiplanar CT image reconstructions and MIPs were obtained to evaluate the vascular anatomy. Carotid stenosis measurements (when applicable) are obtained utilizing NASCET criteria, using the distal internal carotid diameter as the denominator. CONTRAST:  90mL ISOVUE-370 IOPAMIDOL (ISOVUE-370) INJECTION 76% COMPARISON:  CT head 06/21/2018 FINDINGS: CTA NECK FINDINGS Aortic arch: Atherosclerotic calcification aortic arch and proximal great vessels. Negative for aneurysm or stenosis. Right carotid system: Mild atherosclerotic calcification right carotid bifurcation without significant stenosis. Left carotid system: Mild atherosclerotic disease left carotid bifurcation without  significant stenosis. Vertebral arteries: Both vertebral arteries are patent to the basilar. Moderate stenosis distal right vertebral artery. Skeleton: Cervical spondylosis.  No acute skeletal abnormality. Other neck: Thyroid goiter. Negative for mass or adenopathy in the neck. Upper chest: Negative Review of the MIP images confirms the above findings CTA HEAD FINDINGS Anterior circulation: Moderate stenosis left cavernous carotid due to atherosclerotic calcification. Mild calcification right cavernous carotid without stenosis. Anterior and middle cerebral arteries patent bilaterally without stenosis or aneurysm. Posterior circulation: Moderately severe stenosis distal right vertebral artery at the level the dura due to calcific plaque. Mild stenosis distal left vertebral artery. PICA patent bilaterally. Basilar widely patent. Superior cerebellar and posterior cerebral arteries patent bilaterally without stenosis or aneurysm. Venous sinuses: Not significantly opacified due to timing of the scan. Anatomic variants: None Delayed phase: Not performed Review of the MIP images confirms the above findings IMPRESSION: Mild atherosclerotic disease of the carotid bifurcation bilaterally without significant stenosis Moderate stenosis distal right vertebral artery, mild stenosis distal left vertebral artery. Negative for emergent large vessel occlusion. Electronically Signed   By: Franchot Gallo M.D.   On: 06/21/2018 14:06   Ct Head Code Stroke Wo Contrast  Result Date: 06/21/2018 CLINICAL DATA:  Code stroke. Altered level of consciousness. Left facial droop. Slurred speech. EXAM: CT HEAD WITHOUT CONTRAST TECHNIQUE: Contiguous axial images were obtained from the base of the skull through the vertex without intravenous contrast. COMPARISON:  CT  head 06/05/2018 FINDINGS: Brain: Mild atrophy is unchanged. Negative for acute infarct, hemorrhage, mass. Mild chronic microvascular ischemia in the white matter. Vascular: Negative  for hyperdense vessel. Atherosclerotic calcification at the skull base. Skull: Negative Sinuses/Orbits: Negative Other: None ASPECTS ([place] Stroke Program Early CT Score) - Ganglionic level infarction (caudate, lentiform nuclei, internal capsule, insula, M1-M3 cortex): 7 - Supraganglionic infarction (M4-M6 cortex): 3 Total score (0-10 with 10 being normal): 10 IMPRESSION: 1. No acute abnormality 2. ASPECTS is 10 3. These results were called by telephone at the time of interpretation on 06/21/2018 at 1:44 pm to Dr. Rory Percy , who verbally acknowledged these results. Electronically Signed   By: Franchot Gallo M.D.   On: 06/21/2018 13:44   Laurey Morale, MSN, NP-C [facility] [phone]  06/21/2018, 1:35 PM   Attending physician note to follow with Assessment and plan .  Attending addendum Patient seen and examined as an acute code stroke. Agree with the history and physical documented above. I have independently reviewed imaging. CT of the head shows no acute changes. CTA head and neck shows no large vessel occlusion.  Mild atherosclerotic disease of carotid bifurcation bilaterally and mild to moderate stenosis of the distal right vertebral artery.  Assessment: 82 year old woman past medical history of stage III metastatic adenocarcinoma of the head of the pancreas, hypertension, hyperlipidemia, atrial fibrillation on Xarelto, also on Plavix, presented as a code stroke for possible right facial droop and right-sided weakness. Patient reports her speech to be mildly dysarthric but no other major complaints. Her exam was significant for mild right-sided hemiparesis and may be mild dysarthria. I recommended obtaining a stat MRI to evaluate for underlying stroke. Most likely differential is toxic metabolic encephalopathy but a stroke can never be ruled out without an MRI especially in a person with some risk factors.  Symptoms too mild to treat, hence not a candidate for TPA.  No LVO  hence not a candidate for DVT.  Impression: Stroke Risk Factors - atrial fibrillation, hypercoagulable state, hyperlipidemia, hypertension and age   Recommendations: --MRI Brain-stat MRI brain  Addendum after MRI. MRI of the brain negative for acute stroke although it was motion little. I do not see a need for pursuing further stroke risk factor work-up as she is currently on anticoagulation. If her symptoms return or persist, she can be brought back for further evaluation, but in the absence of that I would not recommend any further inpatient neurological work-up. The only caveat to that is, check urinalysis to ensure she has no UTI or any other cause of sudden onset of nonspecific neurological symptoms. Please call neurology with questions.  -- Amie Portland, MD [facility] Pager: [phone] If 7pm to 7am, please call on call as listed on AMION.   CRITICAL CARE ATTESTATION This patient is critically ill and at significant risk of neurological worsening, death and care requires constant monitoring of vital signs, hemodynamics, respiratory, and cardiac monitoring. I spent 35  minutes of neurocritical care time performing neurological assessment, discussion with family, other specialists and medical decision making of high complexity in the care of  this patient.

## 2018-06-21 NOTE — ED Notes (Signed)
Pt remains in MRI at this time  

## 2018-06-21 NOTE — ED Notes (Signed)
[facility] called to transport pt back to [facility]

## 2018-06-21 NOTE — Discharge Instructions (Addendum)
MRI showed no evidence of stroke.  Patient stable for discharge back to [facility].  Follow-up with your doctors.  Return for any new or worse symptoms.

## 2018-06-21 NOTE — ED Notes (Signed)
Pt returned from MRI. Family at bedside

## 2018-06-22 ENCOUNTER — Telehealth: Payer: Self-pay

## 2018-06-22 DIAGNOSIS — R531 Weakness: Secondary | ICD-10-CM | POA: Diagnosis not present

## 2018-06-22 DIAGNOSIS — R29810 Facial weakness: Secondary | ICD-10-CM | POA: Diagnosis not present

## 2018-06-22 NOTE — Telephone Encounter (Signed)
error 

## 2018-06-23 DIAGNOSIS — N390 Urinary tract infection, site not specified: Secondary | ICD-10-CM | POA: Diagnosis not present

## 2018-06-23 DIAGNOSIS — R531 Weakness: Secondary | ICD-10-CM | POA: Diagnosis not present

## 2018-06-23 DIAGNOSIS — R278 Other lack of coordination: Secondary | ICD-10-CM | POA: Diagnosis not present

## 2018-06-26 DIAGNOSIS — E8809 Other disorders of plasma-protein metabolism, not elsewhere classified: Secondary | ICD-10-CM | POA: Diagnosis not present

## 2018-06-26 DIAGNOSIS — R945 Abnormal results of liver function studies: Secondary | ICD-10-CM | POA: Diagnosis not present

## 2018-06-26 DIAGNOSIS — E876 Hypokalemia: Secondary | ICD-10-CM | POA: Diagnosis not present

## 2018-06-26 DIAGNOSIS — R278 Other lack of coordination: Secondary | ICD-10-CM | POA: Diagnosis not present

## 2018-06-27 ENCOUNTER — Emergency Department (HOSPITAL_COMMUNITY): Payer: PPO

## 2018-06-27 ENCOUNTER — Encounter (HOSPITAL_COMMUNITY)
Admission: EM | Disposition: A | Discharge status: Hospice | Payer: Self-pay | Source: Home / Self Care | Attending: Family Medicine

## 2018-06-27 ENCOUNTER — Encounter (HOSPITAL_COMMUNITY): Payer: Self-pay

## 2018-06-27 ENCOUNTER — Inpatient Hospital Stay (HOSPITAL_COMMUNITY)
Admission: EM | Admit: 2018-06-27 | Discharge: 2018-07-09 | DRG: 853 | Disposition: A | Discharge status: Hospice | Payer: PPO | Attending: Family Medicine | Admitting: Family Medicine

## 2018-06-27 ENCOUNTER — Other Ambulatory Visit: Payer: Self-pay

## 2018-06-27 DIAGNOSIS — R7989 Other specified abnormal findings of blood chemistry: Secondary | ICD-10-CM | POA: Diagnosis not present

## 2018-06-27 DIAGNOSIS — Z8051 Family history of malignant neoplasm of kidney: Secondary | ICD-10-CM

## 2018-06-27 DIAGNOSIS — N3000 Acute cystitis without hematuria: Secondary | ICD-10-CM | POA: Diagnosis not present

## 2018-06-27 DIAGNOSIS — C801 Malignant (primary) neoplasm, unspecified: Secondary | ICD-10-CM

## 2018-06-27 DIAGNOSIS — M7989 Other specified soft tissue disorders: Secondary | ICD-10-CM | POA: Diagnosis not present

## 2018-06-27 DIAGNOSIS — R000 Tachycardia, unspecified: Secondary | ICD-10-CM | POA: Diagnosis not present

## 2018-06-27 DIAGNOSIS — Z9221 Personal history of antineoplastic chemotherapy: Secondary | ICD-10-CM | POA: Diagnosis not present

## 2018-06-27 DIAGNOSIS — R627 Adult failure to thrive: Secondary | ICD-10-CM | POA: Diagnosis present

## 2018-06-27 DIAGNOSIS — J189 Pneumonia, unspecified organism: Secondary | ICD-10-CM | POA: Diagnosis not present

## 2018-06-27 DIAGNOSIS — E873 Alkalosis: Secondary | ICD-10-CM | POA: Diagnosis present

## 2018-06-27 DIAGNOSIS — Z8042 Family history of malignant neoplasm of prostate: Secondary | ICD-10-CM

## 2018-06-27 DIAGNOSIS — A4159 Other Gram-negative sepsis: Secondary | ICD-10-CM | POA: Diagnosis not present

## 2018-06-27 DIAGNOSIS — C259 Malignant neoplasm of pancreas, unspecified: Secondary | ICD-10-CM | POA: Diagnosis not present

## 2018-06-27 DIAGNOSIS — T797XXA Traumatic subcutaneous emphysema, initial encounter: Secondary | ICD-10-CM | POA: Diagnosis not present

## 2018-06-27 DIAGNOSIS — Z96652 Presence of left artificial knee joint: Secondary | ICD-10-CM | POA: Diagnosis present

## 2018-06-27 DIAGNOSIS — C787 Secondary malignant neoplasm of liver and intrahepatic bile duct: Secondary | ICD-10-CM | POA: Diagnosis present

## 2018-06-27 DIAGNOSIS — M6088 Other myositis, other site: Secondary | ICD-10-CM | POA: Diagnosis not present

## 2018-06-27 DIAGNOSIS — G893 Neoplasm related pain (acute) (chronic): Secondary | ICD-10-CM

## 2018-06-27 DIAGNOSIS — Z515 Encounter for palliative care: Secondary | ICD-10-CM | POA: Diagnosis not present

## 2018-06-27 DIAGNOSIS — R52 Pain, unspecified: Secondary | ICD-10-CM | POA: Diagnosis not present

## 2018-06-27 DIAGNOSIS — R64 Cachexia: Secondary | ICD-10-CM | POA: Diagnosis present

## 2018-06-27 DIAGNOSIS — L03113 Cellulitis of right upper limb: Secondary | ICD-10-CM | POA: Diagnosis present

## 2018-06-27 DIAGNOSIS — D649 Anemia, unspecified: Secondary | ICD-10-CM | POA: Diagnosis present

## 2018-06-27 DIAGNOSIS — C7889 Secondary malignant neoplasm of other digestive organs: Secondary | ICD-10-CM | POA: Diagnosis not present

## 2018-06-27 DIAGNOSIS — R4182 Altered mental status, unspecified: Secondary | ICD-10-CM | POA: Diagnosis not present

## 2018-06-27 DIAGNOSIS — I959 Hypotension, unspecified: Secondary | ICD-10-CM | POA: Diagnosis not present

## 2018-06-27 DIAGNOSIS — E876 Hypokalemia: Secondary | ICD-10-CM | POA: Diagnosis present

## 2018-06-27 DIAGNOSIS — Z90411 Acquired partial absence of pancreas: Secondary | ICD-10-CM

## 2018-06-27 DIAGNOSIS — C250 Malignant neoplasm of head of pancreas: Secondary | ICD-10-CM | POA: Diagnosis present

## 2018-06-27 DIAGNOSIS — Z801 Family history of malignant neoplasm of trachea, bronchus and lung: Secondary | ICD-10-CM

## 2018-06-27 DIAGNOSIS — Z8249 Family history of ischemic heart disease and other diseases of the circulatory system: Secondary | ICD-10-CM

## 2018-06-27 DIAGNOSIS — R109 Unspecified abdominal pain: Secondary | ICD-10-CM | POA: Diagnosis not present

## 2018-06-27 DIAGNOSIS — K5900 Constipation, unspecified: Secondary | ICD-10-CM | POA: Diagnosis present

## 2018-06-27 DIAGNOSIS — Z825 Family history of asthma and other chronic lower respiratory diseases: Secondary | ICD-10-CM

## 2018-06-27 DIAGNOSIS — Z833 Family history of diabetes mellitus: Secondary | ICD-10-CM

## 2018-06-27 DIAGNOSIS — Z79891 Long term (current) use of opiate analgesic: Secondary | ICD-10-CM

## 2018-06-27 DIAGNOSIS — D72829 Elevated white blood cell count, unspecified: Secondary | ICD-10-CM

## 2018-06-27 DIAGNOSIS — X58XXXA Exposure to other specified factors, initial encounter: Secondary | ICD-10-CM | POA: Diagnosis present

## 2018-06-27 DIAGNOSIS — Z5329 Procedure and treatment not carried out because of patient's decision for other reasons: Secondary | ICD-10-CM | POA: Diagnosis present

## 2018-06-27 DIAGNOSIS — R1084 Generalized abdominal pain: Secondary | ICD-10-CM | POA: Diagnosis not present

## 2018-06-27 DIAGNOSIS — I4891 Unspecified atrial fibrillation: Secondary | ICD-10-CM | POA: Diagnosis present

## 2018-06-27 DIAGNOSIS — K5289 Other specified noninfective gastroenteritis and colitis: Secondary | ICD-10-CM | POA: Diagnosis present

## 2018-06-27 DIAGNOSIS — B957 Other staphylococcus as the cause of diseases classified elsewhere: Secondary | ICD-10-CM | POA: Diagnosis not present

## 2018-06-27 DIAGNOSIS — Z7189 Other specified counseling: Secondary | ICD-10-CM

## 2018-06-27 DIAGNOSIS — Z95828 Presence of other vascular implants and grafts: Secondary | ICD-10-CM | POA: Diagnosis not present

## 2018-06-27 DIAGNOSIS — Z800 Family history of malignant neoplasm of digestive organs: Secondary | ICD-10-CM

## 2018-06-27 DIAGNOSIS — Z79899 Other long term (current) drug therapy: Secondary | ICD-10-CM | POA: Diagnosis not present

## 2018-06-27 DIAGNOSIS — R7881 Bacteremia: Secondary | ICD-10-CM | POA: Diagnosis not present

## 2018-06-27 DIAGNOSIS — Z7902 Long term (current) use of antithrombotics/antiplatelets: Secondary | ICD-10-CM

## 2018-06-27 DIAGNOSIS — Z90710 Acquired absence of both cervix and uterus: Secondary | ICD-10-CM

## 2018-06-27 DIAGNOSIS — G9341 Metabolic encephalopathy: Secondary | ICD-10-CM | POA: Diagnosis not present

## 2018-06-27 DIAGNOSIS — I10 Essential (primary) hypertension: Secondary | ICD-10-CM | POA: Diagnosis present

## 2018-06-27 DIAGNOSIS — Z86711 Personal history of pulmonary embolism: Secondary | ICD-10-CM

## 2018-06-27 DIAGNOSIS — N179 Acute kidney failure, unspecified: Secondary | ICD-10-CM | POA: Diagnosis present

## 2018-06-27 DIAGNOSIS — I252 Old myocardial infarction: Secondary | ICD-10-CM

## 2018-06-27 DIAGNOSIS — Z7901 Long term (current) use of anticoagulants: Secondary | ICD-10-CM

## 2018-06-27 DIAGNOSIS — E039 Hypothyroidism, unspecified: Secondary | ICD-10-CM | POA: Diagnosis present

## 2018-06-27 DIAGNOSIS — K7689 Other specified diseases of liver: Secondary | ICD-10-CM | POA: Diagnosis not present

## 2018-06-27 DIAGNOSIS — C786 Secondary malignant neoplasm of retroperitoneum and peritoneum: Secondary | ICD-10-CM | POA: Diagnosis not present

## 2018-06-27 DIAGNOSIS — Z1619 Resistance to other specified beta lactam antibiotics: Secondary | ICD-10-CM | POA: Diagnosis present

## 2018-06-27 DIAGNOSIS — B370 Candidal stomatitis: Secondary | ICD-10-CM | POA: Diagnosis not present

## 2018-06-27 DIAGNOSIS — R410 Disorientation, unspecified: Secondary | ICD-10-CM | POA: Diagnosis not present

## 2018-06-27 DIAGNOSIS — R29810 Facial weakness: Secondary | ICD-10-CM | POA: Diagnosis present

## 2018-06-27 DIAGNOSIS — M1990 Unspecified osteoarthritis, unspecified site: Secondary | ICD-10-CM | POA: Diagnosis present

## 2018-06-27 DIAGNOSIS — E8809 Other disorders of plasma-protein metabolism, not elsewhere classified: Secondary | ICD-10-CM | POA: Diagnosis not present

## 2018-06-27 DIAGNOSIS — Z803 Family history of malignant neoplasm of breast: Secondary | ICD-10-CM

## 2018-06-27 DIAGNOSIS — A419 Sepsis, unspecified organism: Secondary | ICD-10-CM | POA: Diagnosis present

## 2018-06-27 DIAGNOSIS — N390 Urinary tract infection, site not specified: Secondary | ICD-10-CM | POA: Diagnosis not present

## 2018-06-27 DIAGNOSIS — Z66 Do not resuscitate: Secondary | ICD-10-CM | POA: Diagnosis not present

## 2018-06-27 DIAGNOSIS — R8271 Bacteriuria: Secondary | ICD-10-CM | POA: Diagnosis present

## 2018-06-27 DIAGNOSIS — E872 Acidosis: Secondary | ICD-10-CM | POA: Diagnosis not present

## 2018-06-27 DIAGNOSIS — Z6825 Body mass index (BMI) 25.0-25.9, adult: Secondary | ICD-10-CM

## 2018-06-27 DIAGNOSIS — R509 Fever, unspecified: Secondary | ICD-10-CM

## 2018-06-27 DIAGNOSIS — C800 Disseminated malignant neoplasm, unspecified: Secondary | ICD-10-CM | POA: Diagnosis not present

## 2018-06-27 DIAGNOSIS — E162 Hypoglycemia, unspecified: Secondary | ICD-10-CM | POA: Diagnosis not present

## 2018-06-27 DIAGNOSIS — Z9049 Acquired absence of other specified parts of digestive tract: Secondary | ICD-10-CM | POA: Diagnosis not present

## 2018-06-27 HISTORY — PX: I & D EXTREMITY: SHX5045

## 2018-06-27 LAB — URINALYSIS, ROUTINE W REFLEX MICROSCOPIC
BILIRUBIN URINE: NEGATIVE
Glucose, UA: NEGATIVE mg/dL
Ketones, ur: NEGATIVE mg/dL
Nitrite: NEGATIVE
Protein, ur: 100 mg/dL — AB
SPECIFIC GRAVITY, URINE: 1.017 (ref 1.005–1.030)
pH: 7 (ref 5.0–8.0)

## 2018-06-27 LAB — CBC WITH DIFFERENTIAL/PLATELET
Basophils Absolute: 0 10*3/uL (ref 0.0–0.1)
Basophils Relative: 0 %
EOS ABS: 0 10*3/uL (ref 0.0–0.7)
Eosinophils Relative: 0 %
HEMATOCRIT: 35.4 % — AB (ref 36.0–46.0)
HEMOGLOBIN: 11.4 g/dL — AB (ref 12.0–15.0)
LYMPHS ABS: 1.8 10*3/uL (ref 0.7–4.0)
LYMPHS PCT: 11 %
MCH: 30.6 pg (ref 26.0–34.0)
MCHC: 32.2 g/dL (ref 30.0–36.0)
MCV: 94.9 fL (ref 78.0–100.0)
Monocytes Absolute: 1.2 10*3/uL — ABNORMAL HIGH (ref 0.1–1.0)
Monocytes Relative: 8 %
NEUTROS ABS: 12.7 10*3/uL — AB (ref 1.7–7.7)
NEUTROS PCT: 81 %
Platelets: 352 10*3/uL (ref 150–400)
RBC: 3.73 MIL/uL — ABNORMAL LOW (ref 3.87–5.11)
RDW: 14.4 % (ref 11.5–15.5)
WBC: 15.7 10*3/uL — AB (ref 4.0–10.5)

## 2018-06-27 LAB — COMPREHENSIVE METABOLIC PANEL
ALT: 24 U/L (ref 0–44)
AST: 56 U/L — ABNORMAL HIGH (ref 15–41)
Albumin: 1.7 g/dL — ABNORMAL LOW (ref 3.5–5.0)
Alkaline Phosphatase: 261 U/L — ABNORMAL HIGH (ref 38–126)
Anion gap: 9 (ref 5–15)
BUN: 9 mg/dL (ref 8–23)
CHLORIDE: 111 mmol/L (ref 98–111)
CO2: 22 mmol/L (ref 22–32)
CREATININE: 1.06 mg/dL — AB (ref 0.44–1.00)
Calcium: 8.2 mg/dL — ABNORMAL LOW (ref 8.9–10.3)
GFR, EST AFRICAN AMERICAN: 56 mL/min — AB (ref >60)
GFR, EST NON AFRICAN AMERICAN: 48 mL/min — AB (ref >60)
Glucose, Bld: 109 mg/dL — ABNORMAL HIGH (ref 70–99)
POTASSIUM: 2.8 mmol/L — AB (ref 3.5–5.1)
SODIUM: 142 mmol/L (ref 135–145)
Total Bilirubin: 0.9 mg/dL (ref 0.3–1.2)
Total Protein: 7.3 g/dL (ref 6.5–8.1)

## 2018-06-27 LAB — I-STAT TROPONIN, ED: TROPONIN I, POC: 0.15 ng/mL — AB (ref 0.00–0.08)

## 2018-06-27 LAB — I-STAT CHEM 8, ED
BUN: 7 mg/dL — ABNORMAL LOW (ref 8–23)
CALCIUM ION: 1.18 mmol/L (ref 1.15–1.40)
Chloride: 109 mmol/L (ref 98–111)
Creatinine, Ser: 1 mg/dL (ref 0.44–1.00)
GLUCOSE: 105 mg/dL — AB (ref 70–99)
HCT: 35 % — ABNORMAL LOW (ref 36.0–46.0)
Hemoglobin: 11.9 g/dL — ABNORMAL LOW (ref 12.0–15.0)
Potassium: 2.8 mmol/L — ABNORMAL LOW (ref 3.5–5.1)
SODIUM: 144 mmol/L (ref 135–145)
TCO2: 21 mmol/L — AB (ref 22–32)

## 2018-06-27 LAB — I-STAT CG4 LACTIC ACID, ED
LACTIC ACID, VENOUS: 2.44 mmol/L — AB (ref 0.5–1.9)
Lactic Acid, Venous: 2.48 mmol/L (ref 0.5–1.9)

## 2018-06-27 LAB — LIPASE, BLOOD: LIPASE: 23 U/L (ref 11–51)

## 2018-06-27 SURGERY — IRRIGATION AND DEBRIDEMENT EXTREMITY
Anesthesia: General | Laterality: Right

## 2018-06-27 MED ORDER — FENTANYL CITRATE (PF) 250 MCG/5ML IJ SOLN
INTRAMUSCULAR | Status: AC
  Filled 2018-06-27: qty 5

## 2018-06-27 MED ORDER — CLINDAMYCIN PHOSPHATE 600 MG/50ML IV SOLN
600.0000 mg | Freq: Once | INTRAVENOUS | Status: AC
  Administered 2018-06-27: 600 mg via INTRAVENOUS
  Filled 2018-06-27: qty 50

## 2018-06-27 MED ORDER — SODIUM CHLORIDE 0.9 % IV BOLUS
1000.0000 mL | Freq: Once | INTRAVENOUS | Status: AC
  Administered 2018-06-27: 1000 mL via INTRAVENOUS

## 2018-06-27 MED ORDER — PROPOFOL 10 MG/ML IV BOLUS
INTRAVENOUS | Status: AC
  Filled 2018-06-27: qty 20

## 2018-06-27 MED ORDER — ONDANSETRON HCL 4 MG/2ML IJ SOLN
INTRAMUSCULAR | Status: AC
  Filled 2018-06-27: qty 2

## 2018-06-27 MED ORDER — THROMBIN 20000 UNITS EX KIT
20000.0000 [IU] | PACK | Freq: Once | CUTANEOUS | Status: DC
  Filled 2018-06-27: qty 1

## 2018-06-27 MED ORDER — IOPAMIDOL (ISOVUE-300) INJECTION 61%
100.0000 mL | Freq: Once | INTRAVENOUS | Status: AC | PRN
  Administered 2018-06-27: 100 mL via INTRAVENOUS

## 2018-06-27 MED ORDER — METRONIDAZOLE IN NACL 5-0.79 MG/ML-% IV SOLN
500.0000 mg | Freq: Three times a day (TID) | INTRAVENOUS | Status: DC
  Administered 2018-06-27 – 2018-06-29 (×6): 500 mg via INTRAVENOUS
  Filled 2018-06-27 (×7): qty 100

## 2018-06-27 MED ORDER — IOPAMIDOL (ISOVUE-300) INJECTION 61%
INTRAVENOUS | Status: AC
  Filled 2018-06-27: qty 100

## 2018-06-27 MED ORDER — POTASSIUM CHLORIDE 10 MEQ/100ML IV SOLN
10.0000 meq | Freq: Once | INTRAVENOUS | Status: AC
  Administered 2018-06-27: 10 meq via INTRAVENOUS
  Filled 2018-06-27: qty 100

## 2018-06-27 MED ORDER — VANCOMYCIN HCL IN DEXTROSE 1-5 GM/200ML-% IV SOLN
1000.0000 mg | Freq: Once | INTRAVENOUS | Status: AC
  Administered 2018-06-27: 1000 mg via INTRAVENOUS
  Filled 2018-06-27: qty 200

## 2018-06-27 MED ORDER — SODIUM CHLORIDE 0.9 % IV SOLN
2.0000 g | Freq: Once | INTRAVENOUS | Status: AC
  Administered 2018-06-27: 2 g via INTRAVENOUS
  Filled 2018-06-27: qty 2

## 2018-06-27 MED ORDER — DEXAMETHASONE SODIUM PHOSPHATE 10 MG/ML IJ SOLN
INTRAMUSCULAR | Status: AC
  Filled 2018-06-27: qty 1

## 2018-06-27 SURGICAL SUPPLY — 28 items
BAG ZIPLOCK 12X15 (MISCELLANEOUS) IMPLANT
BANDAGE ESMARK 6X9 LF (GAUZE/BANDAGES/DRESSINGS) ×1 IMPLANT
BNDG ESMARK 6X9 LF (GAUZE/BANDAGES/DRESSINGS) ×2
BNDG GAUZE ELAST 4 BULKY (GAUZE/BANDAGES/DRESSINGS) ×2 IMPLANT
COVER SURGICAL LIGHT HANDLE (MISCELLANEOUS) ×2 IMPLANT
CUFF TOURN SGL QUICK 18 (TOURNIQUET CUFF) IMPLANT
CUFF TOURN SGL QUICK 24 (TOURNIQUET CUFF)
CUFF TOURN SGL QUICK 34 (TOURNIQUET CUFF)
CUFF TRNQT CYL 24X4X40X1 (TOURNIQUET CUFF) IMPLANT
CUFF TRNQT CYL 34X4X40X1 (TOURNIQUET CUFF) IMPLANT
DRAIN PENROSE 18X1/2 LTX STRL (DRAIN) IMPLANT
DRSG PAD ABDOMINAL 8X10 ST (GAUZE/BANDAGES/DRESSINGS) IMPLANT
DURAPREP 26ML APPLICATOR (WOUND CARE) IMPLANT
ELECT REM PT RETURN 15FT ADLT (MISCELLANEOUS) ×2 IMPLANT
GAUZE SPONGE 4X4 12PLY STRL (GAUZE/BANDAGES/DRESSINGS) IMPLANT
GLOVE SURG ORTHO 8.0 STRL STRW (GLOVE) ×2 IMPLANT
GOWN STRL REUS W/TWL LRG LVL3 (GOWN DISPOSABLE) ×2 IMPLANT
HANDPIECE INTERPULSE COAX TIP (DISPOSABLE)
KIT BASIN OR (CUSTOM PROCEDURE TRAY) ×2 IMPLANT
KIT PREVENA INCISION MGT20CM45 (CANNISTER) ×2 IMPLANT
PACK TOTAL JOINT (CUSTOM PROCEDURE TRAY) ×2 IMPLANT
PAD CAST 4YDX4 CTTN HI CHSV (CAST SUPPLIES) IMPLANT
PADDING CAST COTTON 4X4 STRL (CAST SUPPLIES)
POSITIONER SURGICAL ARM (MISCELLANEOUS) IMPLANT
SET HNDPC FAN SPRY TIP SCT (DISPOSABLE) IMPLANT
SUT ETHILON 2 0 PS N (SUTURE) ×4 IMPLANT
SYR CONTROL 10ML LL (SYRINGE) IMPLANT
TOWEL OR 17X26 10 PK STRL BLUE (TOWEL DISPOSABLE) ×2 IMPLANT

## 2018-06-27 NOTE — ED Notes (Signed)
Unable to obtain I stat lactic due to infiltrated IV and no venous access at this time.

## 2018-06-27 NOTE — ED Notes (Addendum)
Attempted x 3 by 2 RN's to insert foley with no success. Purewick replaced.

## 2018-06-27 NOTE — ED Provider Notes (Signed)
[place] [facility] DEPT Provider Note   CSN: [id number] Arrival date & time: 06/27/18  1343     History   Chief Complaint Chief Complaint  Patient presents with  . possible sepsis    HPI Lauren Richard is a 82 y.o. female.  The history is provided by the patient, medical records and a relative. No language interpreter was used.    Lauren Richard is a 82 y.o. female who presents to the Emergency Department complaining of abdominal pain. She presents to the emergency department complaining of abdominal pain. She has known pancreatic cancer and yesterday developed worsening central abdominal pain. She reports feeling very weak with nausea and vomiting with associated constipation. EMS noted a temperature to 104 orally. Symptoms are severe and constant in nature.  Past Medical History:  Diagnosis Date  . Adenocarcinoma of head of pancreas ([place]) 11/24/2017  . Arthritis    "all over" (04/26/2017); knees, shoulder  . Essential hypertension 03/03/2017  . Family history of breast cancer   . Family history of kidney cancer   . Family history of pancreatic cancer   . Family history of prostate cancer   . History of blood transfusion 1979   "when I had my hysterectomy"  . Hyperlipidemia   . Hypothyroidism   . Obesity 03/03/2017  . Osteoarthritis 03/03/2017  . Pneumonia    as a child    Patient Active Problem List   Diagnosis Date Noted  . Tachycardia 06/05/2018  . Elevated troponin 06/05/2018  . Dyspnea 06/05/2018  . Abnormal liver function 06/05/2018  . Genetic testing 05/05/2018  . Malnutrition of moderate degree 04/25/2018  . Malignant neoplasm of head of pancreas ([place])   . Heme positive stool   . Syncope 04/23/2018  . Syncope, near 04/22/2018  . Pancreatic cancer metastasized to liver ([place]) 04/22/2018  . Generalized weakness 03/29/2018  . Nausea & vomiting 03/29/2018  . Diarrhea 03/29/2018  . Chemotherapy induced neutropenia ([place]) 03/23/2018  .  Family history of pancreatic cancer   . Family history of breast cancer   . Family history of kidney cancer   . Family history of prostate cancer   . Goals of care, counseling/discussion 03/09/2018  . Acute pulmonary embolism ([place]) 03/09/2018  . S/P laparoscopy 01/05/2018  . Adenocarcinoma of head of pancreas ([place]) 11/24/2017  . Acute kidney injury superimposed on chronic kidney disease ([place])   . Hypokalemia   . Anemia   . Biliary obstruction   . Acute kidney injury ([place]) 11/03/2017  . Pancreatic mass 11/03/2017  . Osteoarthritis of left knee 04/26/2017  . S/P total knee replacement using cement, left 04/26/2017  . Essential hypertension 03/03/2017  . Hyperlipidemia 03/03/2017  . Osteoarthritis 03/03/2017  . Obesity 03/03/2017  . Status post left foot surgery 12/03/2015  . Hammertoe 11/03/2015    Past Surgical History:  Procedure Laterality Date  . BREAST CYST EXCISION Right    "cut 6 cysts out"  . CYST EXCISION Left    "off my toe"  . DILATION AND CURETTAGE OF UTERUS     "before hysterectomy"  . ERCP N/A 11/04/2017   Procedure: ENDOSCOPIC RETROGRADE CHOLANGIOPANCREATOGRAPHY (ERCP);  Surgeon: Carol Ada, MD;  Location: [facility];  Service: Endoscopy;  Laterality: N/A;  . ESOPHAGOGASTRODUODENOSCOPY N/A 04/24/2018   Procedure: ESOPHAGOGASTRODUODENOSCOPY (EGD);  Surgeon: Jerene Bears, MD;  Location: [facility];  Service: Gastroenterology;  Laterality: N/A;  . EUS N/A 11/11/2017   Procedure: UPPER ENDOSCOPIC ULTRASOUND (EUS) LINEAR;  Surgeon: Carol Ada, MD;  Location: [facility];  Service: Endoscopy;  Laterality: N/A;  . FINE NEEDLE ASPIRATION N/A 11/11/2017   Procedure: FINE NEEDLE ASPIRATION (FNA) LINEAR;  Surgeon: Carol Ada, MD;  Location: [facility];  Service: Endoscopy;  Laterality: N/A;  . INGUINAL HERNIA REPAIR Right   . LAPAROSCOPY N/A 01/05/2018   Procedure: LAPAROSCOPY DIAGNOSTIC ERAS PATHWAY;  Surgeon: Stark Klein, MD;  Location: [facility];  Service:  General;  Laterality: N/A;  [place]  . PORTACATH PLACEMENT Left 04/11/2018   Procedure: INSERTION PORT-A-CATH;  Surgeon: Stark Klein, MD;  Location: [facility];  Service: General;  Laterality: Left;  . REDUCTION MAMMAPLASTY Bilateral 1980  . TONSILLECTOMY     "when I small"  . TOTAL ABDOMINAL HYSTERECTOMY  1979  . TOTAL KNEE ARTHROPLASTY Left 04/26/2017   Procedure: LEFT TOTAL KNEE ARTHROPLASTY;  Surgeon: [name], MD;  Location: [facility];  Service: Orthopedics;  Laterality: Left;  . WHIPPLE PROCEDURE N/A 01/05/2018   Procedure: WHIPPLE PROCEDURE;  Surgeon: Stark Klein, MD;  Location: [facility];  Service: General;  Laterality: N/A;     OB History   None      Home Medications    Prior to Admission medications   Medication Sig Start Date End Date Taking? Authorizing Provider  acetaminophen (TYLENOL) 500 MG tablet Take 500 mg by mouth every 12 (twelve) hours.   Yes [provider]  amoxicillin-clavulanate (AUGMENTIN) 500-125 MG tablet Take 1 tablet by mouth every 2 (two) hours.   Yes [provider]  capecitabine (XELODA) 500 MG tablet Take 2 tablets (1000mg ) by mouth 2 times daily, within 30 min of finishing food. Take on days 1-7 and 15-21 of each 28 day cycle 05/01/18  Yes Truitt Merle, MD  clopidogrel (PLAVIX) 75 MG tablet Take 1 tablet (75 mg total) by mouth daily. 06/11/18  Yes Thurnell Lose, MD  colchicine 0.6 MG tablet Take 0.6 mg by mouth once.   Yes [provider]  docusate sodium (COLACE) 100 MG capsule Take 100 mg by mouth daily. Hold for loose stool   Yes [provider]  gabapentin (NEURONTIN) 100 MG capsule Take 2 capsules (200 mg total) by mouth 2 (two) times daily. 01/13/18  Yes Stark Klein, MD  Lidocaine (ASPERCREME LIDOCAINE) 4 % PTCH Apply 1 patch topically every 12 (twelve) hours. Lower back   Yes [provider]  magnesium oxide (MAG-OX) 400 MG tablet Take 400 mg by mouth daily.   Yes [provider]  metoprolol  tartrate (LOPRESSOR) 25 MG tablet Take 0.5 tablets (12.5 mg total) by mouth 2 (two) times daily. 06/10/18  Yes Thurnell Lose, MD  mirtazapine (REMERON) 7.5 MG tablet Take 1 tablet (7.5 mg total) by mouth at bedtime. 05/23/18  Yes Truitt Merle, MD  nystatin (MYCOSTATIN) 100000 UNIT/ML suspension Take 5 mLs (500,000 Units total) by mouth 4 (four) times daily. 03/24/18  Yes Alla Feeling, NP  oxyCODONE (OXY IR/ROXICODONE) 5 MG immediate release tablet Take 1-2 tablets (5-10 mg total) by mouth every 6 (six) hours as needed for severe pain. 06/10/18  Yes Thurnell Lose, MD  pantoprazole (PROTONIX) 40 MG tablet Take 1 tablet (40 mg total) by mouth daily at 12 noon. Patient taking differently: Take 40 mg by mouth daily.  01/13/18  Yes Stark Klein, MD  potassium chloride SA (K-DUR,KLOR-CON) 20 MEQ tablet Take 2 tablets (40 mEq total) by mouth daily. 04/20/18  Yes Truitt Merle, MD  prochlorperazine (COMPAZINE) 10 MG tablet Take 10 mg by mouth every 6 (  six) hours as needed for nausea or vomiting.   Yes [provider]  rivaroxaban (XARELTO) 20 MG TABS tablet Take 1 tablet (20 mg total) by mouth daily with supper. 04/04/18  Yes Sheikh, Omair Latif, DO  rosuvastatin (CRESTOR) 20 MG tablet Take 1 tablet (20 mg total) by mouth daily at 6 PM. 06/10/18  Yes Thurnell Lose, MD  traMADol (ULTRAM) 50 MG tablet Take 1 tablet (50 mg total) by mouth every 12 (twelve) hours as needed (for pain.). Patient taking differently: Take 50 mg by mouth every 8 (eight) hours. Hold if sleepy or confused 06/10/18  Yes Thurnell Lose, MD  feeding supplement, ENSURE ENLIVE, (ENSURE ENLIVE) LIQD Take 237 mLs by mouth 3 (three) times daily between meals. Patient not taking: Reported on 06/21/2018 04/25/18   Mariel Aloe, MD  lidocaine-prilocaine (EMLA) cream Apply 1 application topically as needed. 04/14/18   Truitt Merle, MD  trolamine salicylate (ASPERCREME) 10 % cream Apply 1 application topically 4 (four) times daily as needed  (for knee pain/muscle pain.).     [provider]    Family History Family History  Problem Relation Age of Onset  . Kidney cancer Mother 22  . Lung cancer Father 107       lung, smoker  . Congestive Heart Failure Sister   . Asthma Sister   . Atrial fibrillation Sister   . Hypertension Sister   . Diabetes Sister   . Head & neck cancer Paternal Grandmother   . Cancer Maternal Uncle        type unk  . Pancreatic cancer Paternal Aunt        dx >50  . Cancer Paternal Uncle        type unk  . Breast cancer Cousin 57  . Prostate cancer Other 62       had robotic surgery    Social History Social History   Tobacco Use  . Smoking status: Never Smoker  . Smokeless tobacco: Never Used  Substance Use Topics  . Alcohol use: No    Alcohol/week: 0.0 standard drinks    Frequency: Never    Comment: occasional glass of wine   . Drug use: No     Allergies   Patient has no known allergies.   Review of Systems Review of Systems  All other systems reviewed and are negative.    Physical Exam Updated Vital Signs BP 101/65   Pulse (!) 112   Temp (!) 102.9 F (39.4 C) (Rectal)   Resp (!) 22   Ht 5\' 7"  (1.702 m)   Wt 68 kg   SpO2 98%   BMI 23.48 kg/m   Physical Exam  Constitutional: She is oriented to person, place, and time. She appears well-developed.  Chronically ill appearing  HENT:  Head: Normocephalic and atraumatic.  Cardiovascular: Normal rate and regular rhythm.  No murmur heard. Pulmonary/Chest: Breath sounds normal. No respiratory distress.  Tachycardic  Abdominal: Soft. There is no rebound and no guarding.  Moderate generalized abdominal tenderness  Musculoskeletal: She exhibits no edema or tenderness.  Neurological: She is alert and oriented to person, place, and time.  Skin: Skin is warm and dry.  Psychiatric: She has a normal mood and affect. Her behavior is normal.  Nursing note and vitals reviewed.    ED Treatments / Results   Labs (all labs ordered are listed, but only abnormal results are displayed) Labs Reviewed  COMPREHENSIVE METABOLIC PANEL - Abnormal; Notable for the following components:  Result Value   Potassium 2.8 (*)    Glucose, Bld 109 (*)    Creatinine, Ser 1.06 (*)    Calcium 8.2 (*)    Albumin 1.7 (*)    AST 56 (*)    Alkaline Phosphatase 261 (*)    GFR calc non Af Amer 48 (*)    GFR calc Af Amer 56 (*)    All other components within normal limits  CBC WITH DIFFERENTIAL/PLATELET - Abnormal; Notable for the following components:   WBC 15.7 (*)    RBC 3.73 (*)    Hemoglobin 11.4 (*)    HCT 35.4 (*)    Neutro Abs 12.7 (*)    Monocytes Absolute 1.2 (*)    All other components within normal limits  I-STAT CG4 LACTIC ACID, ED - Abnormal; Notable for the following components:   Lactic Acid, Venous 2.44 (*)    All other components within normal limits  I-STAT CHEM 8, ED - Abnormal; Notable for the following components:   Potassium 2.8 (*)    BUN 7 (*)    Glucose, Bld 105 (*)    TCO2 21 (*)    Hemoglobin 11.9 (*)    HCT 35.0 (*)    All other components within normal limits  I-STAT TROPONIN, ED - Abnormal; Notable for the following components:   Troponin i, poc 0.15 (*)    All other components within normal limits  CULTURE, BLOOD (ROUTINE X 2)  CULTURE, BLOOD (ROUTINE X 2)  LIPASE, BLOOD  URINALYSIS, ROUTINE W REFLEX MICROSCOPIC  I-STAT CG4 LACTIC ACID, ED    EKG None  Radiology Dg Chest Port 1 View  Result Date: 06/27/2018 CLINICAL DATA:  Abdominal pain, fever. History of atrial fibrillation, metastatic pancreatic malignancy. EXAM: PORTABLE CHEST 1 VIEW COMPARISON:  Chest x-ray of June 05, 2018 FINDINGS: The lungs are adequately inflated and clear. The heart and pulmonary vascularity are normal. The mediastinum is normal in width. There is calcification in the wall of the aortic arch. The porta catheter tip projects at the junction of the right and left brachiocephalic veins.  The bony thorax is unremarkable. IMPRESSION: There is no active cardiopulmonary disease. Thoracic aortic atherosclerosis. Electronically Signed   By: David  Martinique M.D.   On: 06/27/2018 14:55    Procedures Procedures (including critical care time) CRITICAL CARE Performed by: Quintella Reichert   Total critical care time: 35 minutes  Critical care time was exclusive of separately billable procedures and treating other patients.  Critical care was necessary to treat or prevent imminent or life-threatening deterioration.  Critical care was time spent personally by me on the following activities: development of treatment plan with patient and/or surrogate as well as nursing, discussions with consultants, evaluation of patient's response to treatment, examination of patient, obtaining history from patient or surrogate, ordering and performing treatments and interventions, ordering and review of laboratory studies, ordering and review of radiographic studies, pulse oximetry and re-evaluation of patient's condition. Medications Ordered in ED Medications  metroNIDAZOLE (FLAGYL) IVPB 500 mg (has no administration in time range)  iopamidol (ISOVUE-300) 61 % injection 100 mL (has no administration in time range)  ceFEPIme (MAXIPIME) 2 g in sodium chloride 0.9 % 100 mL IVPB (2 g Intravenous New Bag/Given 06/27/18 1502)  sodium chloride 0.9 % bolus 1,000 mL (1,000 mLs Intravenous New Bag/Given 06/27/18 1459)     Initial Impression / Assessment and Plan / ED Course  I have reviewed the triage vital signs and the nursing notes.  Pertinent  labs & imaging results that were available during my care of the patient were reviewed by me and considered in my medical decision making (see chart for details).     Patient with history of pancreatic cancer here for evaluation of abdominal pain, vomiting, constipation and fever. She is ill appearing on examination with significant abdominal tenderness. She was treated  with IV fluids and broad-spectrum antibiotics for presumed infection. Patient care transferred pending CT abdomen.  Final Clinical Impressions(s) / ED Diagnoses   Final diagnoses:  None    ED Discharge Orders    None       Quintella Reichert, MD 06/27/18 [phone]

## 2018-06-27 NOTE — ED Notes (Signed)
Bed: WA09 Expected date:  Expected time:  Means of arrival:  Comments: EMS 82yo sepsis?

## 2018-06-27 NOTE — ED Notes (Signed)
Ed provider Ralene Bathe notified that patient has critical Lactic Acid value of 2.44

## 2018-06-27 NOTE — Consult Note (Signed)
Reason for Consult: Right arm infection Referring Physician: Dr Lisette Grinder Lauren Richard is an 82 y.o. female.  HPI: Lauren Richard is an 82 year old female with metastatic pancreatic carcinoma who presents to the emergency room with fever and abdominal pain.  She was noted to have some gas in the right arm on CT of her abdomen.  She does have urinary tract infection.  She has been started on broad-spectrum antibiotics.  White count 15,000 in the ER.  Potassium 2.8 and this was supplemented.  Subsequent CT scan of the arm demonstrates some gas in the anterior compartment of the humerus.  She is not reporting much pain in this region.  Notably the patient did have an ultrasound-guided IV placed into the elbow region at 2:00.  This IV was used for antibiotics but failed after about 2-1/2 hours.  No definite documentation of infiltration or unusual problems with the IV.  Past Medical History:  Diagnosis Date  . Adenocarcinoma of head of pancreas ([place]) 11/24/2017  . Arthritis    "all over" (04/26/2017); knees, shoulder  . Essential hypertension 03/03/2017  . Family history of breast cancer   . Family history of kidney cancer   . Family history of pancreatic cancer   . Family history of prostate cancer   . History of blood transfusion 1979   "when I had my hysterectomy"  . Hyperlipidemia   . Hypothyroidism   . Obesity 03/03/2017  . Osteoarthritis 03/03/2017  . Pneumonia    as a child    Past Surgical History:  Procedure Laterality Date  . BREAST CYST EXCISION Right    "cut 6 cysts out"  . CYST EXCISION Left    "off my toe"  . DILATION AND CURETTAGE OF UTERUS     "before hysterectomy"  . ERCP N/A 11/04/2017   Procedure: ENDOSCOPIC RETROGRADE CHOLANGIOPANCREATOGRAPHY (ERCP);  Surgeon: Carol Ada, MD;  Location: [facility];  Service: Endoscopy;  Laterality: N/A;  . ESOPHAGOGASTRODUODENOSCOPY N/A 04/24/2018   Procedure: ESOPHAGOGASTRODUODENOSCOPY (EGD);  Surgeon: Jerene Bears, MD;  Location: [facility];  Service: Gastroenterology;  Laterality: N/A;  . EUS N/A 11/11/2017   Procedure: UPPER ENDOSCOPIC ULTRASOUND (EUS) LINEAR;  Surgeon: Carol Ada, MD;  Location: [facility];  Service: Endoscopy;  Laterality: N/A;  . FINE NEEDLE ASPIRATION N/A 11/11/2017   Procedure: FINE NEEDLE ASPIRATION (FNA) LINEAR;  Surgeon: Carol Ada, MD;  Location: [facility];  Service: Endoscopy;  Laterality: N/A;  . INGUINAL HERNIA REPAIR Right   . LAPAROSCOPY N/A 01/05/2018   Procedure: LAPAROSCOPY DIAGNOSTIC ERAS PATHWAY;  Surgeon: Stark Klein, MD;  Location: [place] [facility];  Service: General;  Laterality: N/A;  [place]  . PORTACATH PLACEMENT Left 04/11/2018   Procedure: INSERTION PORT-A-CATH;  Surgeon: Stark Klein, MD;  Location: [facility];  Service: General;  Laterality: Left;  . REDUCTION MAMMAPLASTY Bilateral 1980  . TONSILLECTOMY     "when I small"  . TOTAL ABDOMINAL HYSTERECTOMY  1979  . TOTAL KNEE ARTHROPLASTY Left 04/26/2017   Procedure: LEFT TOTAL KNEE ARTHROPLASTY;  Surgeon: [name], MD;  Location: [facility];  Service: Orthopedics;  Laterality: Left;  . WHIPPLE PROCEDURE N/A 01/05/2018   Procedure: WHIPPLE PROCEDURE;  Surgeon: Stark Klein, MD;  Location: [facility] OR;  Service: General;  Laterality: N/A;    Family History  Problem Relation Age of Onset  . Kidney cancer Mother 89  . Lung cancer Father 77       lung, smoker  . Congestive Heart Failure Sister   . Asthma Sister   .  Atrial fibrillation Sister   . Hypertension Sister   . Diabetes Sister   . Head & neck cancer Paternal Grandmother   . Cancer Maternal Uncle        type unk  . Pancreatic cancer Paternal Aunt        dx >50  . Cancer Paternal Uncle        type unk  . Breast cancer Cousin 52  . Prostate cancer Other 74       had robotic surgery    Social History:  reports that she has never smoked. She has never used smokeless tobacco. She reports that she does not drink alcohol or use drugs.  Allergies: No Known  Allergies  Medications: I have reviewed the patient's current medications.  Results for orders placed or performed during the hospital encounter of 06/27/18 (from the past 48 hour(s))  Comprehensive metabolic panel     Status: Abnormal   Collection Time: 06/27/18  2:35 PM  Result Value Ref Range   Sodium 142 135 - 145 mmol/L   Potassium 2.8 (L) 3.5 - 5.1 mmol/L   Chloride 111 98 - 111 mmol/L   CO2 22 22 - 32 mmol/L   Glucose, Bld 109 (H) 70 - 99 mg/dL   BUN 9 8 - 23 mg/dL   Creatinine, Ser 1.06 (H) 0.44 - 1.00 mg/dL   Calcium 8.2 (L) 8.9 - 10.3 mg/dL   Total Protein 7.3 6.5 - 8.1 g/dL   Albumin 1.7 (L) 3.5 - 5.0 g/dL   AST 56 (H) 15 - 41 U/L   ALT 24 0 - 44 U/L   Alkaline Phosphatase 261 (H) 38 - 126 U/L   Total Bilirubin 0.9 0.3 - 1.2 mg/dL   GFR calc non Af Amer 48 (L) >60 mL/min   GFR calc Af Amer 56 (L) >60 mL/min    Comment: (NOTE) The eGFR has been calculated using the CKD EPI equation. This calculation has not been validated in all clinical situations. eGFR's persistently <60 mL/min signify possible Chronic Kidney Disease.    Anion gap 9 5 - 15    Comment: Performed at [facility], [place] [address], [place], [place] [address]  CBC WITH DIFFERENTIAL     Status: Abnormal   Collection Time: 06/27/18  2:35 PM  Result Value Ref Range   WBC 15.7 (H) 4.0 - 10.5 K/uL   RBC 3.73 (L) 3.87 - 5.11 MIL/uL   Hemoglobin 11.4 (L) 12.0 - 15.0 g/dL   HCT 35.4 (L) 36.0 - 46.0 %   MCV 94.9 78.0 - 100.0 fL   MCH 30.6 26.0 - 34.0 pg   MCHC 32.2 30.0 - 36.0 g/dL   RDW 14.4 11.5 - 15.5 %   Platelets 352 150 - 400 K/uL   Neutrophils Relative % 81 %   Neutro Abs 12.7 (H) 1.7 - 7.7 K/uL   Lymphocytes Relative 11 %   Lymphs Abs 1.8 0.7 - 4.0 K/uL   Monocytes Relative 8 %   Monocytes Absolute 1.2 (H) 0.1 - 1.0 K/uL   Eosinophils Relative 0 %   Eosinophils Absolute 0.0 0.0 - 0.7 K/uL   Basophils Relative 0 %   Basophils Absolute 0.0 0.0 - 0.1 K/uL    Comment:  Performed at [facility], [place] [address], [place], [place] [address]  Lipase, blood     Status: None   Collection Time: 06/27/18  2:35 PM  Result Value Ref Range   Lipase 23 11 -  51 U/L    Comment: Performed at [facility], [place] [address] [place], [place] [address]  I-stat troponin, ED     Status: Abnormal   Collection Time: 06/27/18  2:37 PM  Result Value Ref Range   Troponin i, poc 0.15 (HH) 0.00 - 0.08 ng/mL   Comment NOTIFIED PHYSICIAN    Comment 3            Comment: Due to the release kinetics of cTnI, a negative result within the first hours of the onset of symptoms does not rule out myocardial infarction with certainty. If myocardial infarction is still suspected, repeat the test at appropriate intervals.   I-Stat CG4 Lactic Acid, ED  (not at  [facility])     Status: Abnormal   Collection Time: 06/27/18  2:39 PM  Result Value Ref Range   Lactic Acid, Venous 2.44 (HH) 0.5 - 1.9 mmol/L   Comment NOTIFIED PHYSICIAN   I-stat Chem 8, ED     Status: Abnormal   Collection Time: 06/27/18  2:39 PM  Result Value Ref Range   Sodium 144 135 - 145 mmol/L   Potassium 2.8 (L) 3.5 - 5.1 mmol/L   Chloride 109 98 - 111 mmol/L   BUN 7 (L) 8 - 23 mg/dL   Creatinine, Ser 1.00 0.44 - 1.00 mg/dL   Glucose, Bld 105 (H) 70 - 99 mg/dL   Calcium, Ion 1.18 1.15 - 1.40 mmol/L   TCO2 21 (L) 22 - 32 mmol/L   Hemoglobin 11.9 (L) 12.0 - 15.0 g/dL   HCT 35.0 (L) 36.0 - 46.0 %  I-Stat CG4 Lactic Acid, ED  (not at  [facility])     Status: Abnormal   Collection Time: 06/27/18  5:48 PM  Result Value Ref Range   Lactic Acid, Venous 2.48 (HH) 0.5 - 1.9 mmol/L   Comment NOTIFIED PHYSICIAN   Urinalysis, Routine w reflex microscopic     Status: Abnormal   Collection Time: 06/27/18  7:50 PM  Result Value Ref Range   Color, Urine AMBER (A) YELLOW    Comment: BIOCHEMICALS MAY BE AFFECTED BY COLOR   APPearance TURBID (A) CLEAR   Specific Gravity, Urine 1.017 1.005 - 1.030   pH  7.0 5.0 - 8.0   Glucose, UA NEGATIVE NEGATIVE mg/dL   Hgb urine dipstick LARGE (A) NEGATIVE   Bilirubin Urine NEGATIVE NEGATIVE   Ketones, ur NEGATIVE NEGATIVE mg/dL   Protein, ur 100 (A) NEGATIVE mg/dL   Nitrite NEGATIVE NEGATIVE   Leukocytes, UA LARGE (A) NEGATIVE   RBC / HPF 21-50 0 - 5 RBC/hpf   WBC, UA >50 (H) 0 - 5 WBC/hpf   Bacteria, UA RARE (A) NONE SEEN   Squamous Epithelial / LPF 0-5 0 - 5   WBC Clumps PRESENT    Non Squamous Epithelial 0-5 (A) NONE SEEN    Comment: Performed at [facility], [place] [address] [place],  [address]    Ct Humerus Right Wo Contrast  Result Date: 06/27/2018 CLINICAL DATA:  Swelling mid humerus at a former IV site. Gas and fluid was seen in the arm on the CT abdomen and pelvis. EXAM: CT OF THE RIGHT FOREARM WITHOUT CONTRAST; CT OF THE RIGHT HUMERUS WITHOUT CONTRAST TECHNIQUE: Multidetector CT imaging was performed according to the standard protocol. Multiplanar CT image reconstructions were also generated. COMPARISON:  CT abdomen and pelvis 06/27/2018 FINDINGS: Bones/Joint/Cartilage Degenerative changes in the right shoulder. Prominent olecranon spur. Degenerative changes in the radiocarpal joints. No  evidence of acute fracture or dislocation or focal bone lesion involving the humerus, radius, or ulna. Would Ligaments Suboptimally assessed by CT. Muscles and Tendons Diffuse muscular atrophy demonstrated in the right arm. The biceps muscle and sheath appear expanded with poor definition of the intermuscular fat layers. This may be due to infiltration, edema, or hematoma. Soft tissues There is soft tissue swelling, infiltration, and gas present in the anterior aspect of the right arm involving the anterior muscle compartments. There is diffuse infiltration throughout the subcutaneous fat of the right arm mostly anteriorly but also extending posteriorly. Soft tissue infiltration and gas extend from the subcoracoid region down to the level  of the elbow. Infiltration of the subcutaneous fat extends inferiorly throughout the right forearm. Muscle compartments of the right forearm appear intact. Changes could represent edema, cellulitis, abscess, or hematoma or combination of these things. IMPRESSION: 1. Degenerative changes in the right shoulder and radiocarpal joints. No evidence of acute fracture or dislocation. 2. Diffuse soft tissue swelling, infiltration, and gas in the anterior aspect of the right arm involving the anterior muscle compartments and surrounding subcutaneous soft tissues. Changes could represent edema, cellulitis, abscess, or combination of these findings. Electronically Signed   By: Lucienne Capers M.D.   On: 06/27/2018 21:19   Ct Abdomen Pelvis W Contrast  Result Date: 06/27/2018 CLINICAL DATA:  Abdominal pain. Fever. Pancreatic cancer status post Whipple procedure with liver metastases. EXAM: CT ABDOMEN AND PELVIS WITH CONTRAST TECHNIQUE: Multidetector CT imaging of the abdomen and pelvis was performed using the standard protocol following bolus administration of intravenous contrast. CONTRAST:  133m ISOVUE-300 IOPAMIDOL (ISOVUE-300) INJECTION 61% COMPARISON:  03/09/2018 CT abdomen/pelvis. FINDINGS: Lower chest: No significant pulmonary nodules or acute consolidative airspace disease. New mildly enlarged right pericardiophrenic nodes up to 1.0 cm (series 2/image 13). Hepatobiliary: Poorly marginated hyperenhancing confluent masses throughout the liver appear increased in size since 03/09/2018 CT. For example, a 2.4 cm anterior liver mass (series 2/image 16), increased from 0.8 cm. A 3.1 cm posterior right liver mass (series 2/image 26), increased from 1.2 cm. Background diffuse hepatic steatosis. Cholecystectomy. No intrahepatic biliary ductal dilatation status post biliary enteric anastomosis. Pancreas: Status post resection of the pancreatic head with pancreatic-enteric anastomosis. Stable position of pancreatic duct stent  in the pancreatic body. No discrete mass in the remnant pancreas. Spleen: Normal size. No mass. Adrenals/Urinary Tract: No discrete adrenal nodules. Simple 1.5 cm posterior interpolar left renal cyst. No hydronephrosis. Symmetric delayed contrast nephrograms. Normal bladder. Stomach/Bowel: Status post resection of the distal stomach with gastrojejunostomy. No acute gastric remnant abnormality. Normal caliber small bowel with no small bowel wall thickening. Appendix not discretely visualized. Moderate distention of the rectum by stool. Mild circumferential rectal wall thickening with perirectal fat stranding and mild fluid in the presacral space. No additional sites of large bowel wall thickening. Vascular/Lymphatic: Atherosclerotic nonaneurysmal abdominal aorta. Patent hepatic, portal, splenic and right renal veins. Narrowing/occlusion of the left renal vein anterior to the abdominal aorta, new. New soft tissue in the central mesentery measuring up to 1.2 cm (series 2/image 40). New ill-defined left para-aortic soft tissue measuring up to 1.5 cm short axis (series 2/image 36). Reproductive: Status post hysterectomy, with no abnormal findings at the vaginal cuff. No adnexal mass. Other: No pneumoperitoneum. No focal fluid collection. No ascites. Extensive new nodularity throughout the omentum bilaterally, measuring up to 1.5 cm (series 2/image 43). New irregular thickening of the right pericolic gutter peritoneum (series 2/image 37). Musculoskeletal: No aggressive appearing focal osseous lesions.  Moderate thoracolumbar spondylosis. Partial visualization of deep soft tissue fluid and gas throughout right upper extremity (series 2/image 5). IMPRESSION: 1. Progression of liver metastatic disease. 2. New peritoneal carcinomatosis with widespread omental metastases. 3. New nodular soft tissue in the central mesentery and left para-aortic regions, compatible with infiltrative adenopathy or locally recurrent tumor. 4. New  mild right pericardiophrenic adenopathy compatible with metastatic disease. 5. Postsurgical changes from Whipple procedure, with no evidence of bowel obstruction. 6. Moderate rectal stool with mild circumferential rectal wall thickening and perirectal fat stranding and presacral space fluid, cannot exclude stercoral colitis. 7. Partial visualization of extensive deep soft tissue fluid and gas in the right upper extremity, cannot exclude gangrene/necrotizing fasciitis. 8.  Aortic Atherosclerosis (ICD10-I70.0). These results were called by telephone at the time of interpretation on 06/27/2018 at 7:37 pm to Dr. Gilford Raid, who verbally acknowledged these results. Electronically Signed   By: Ilona Sorrel M.D.   On: 06/27/2018 19:37   Ct Forearm Right Wo Contrast  Result Date: 06/27/2018 CLINICAL DATA:  Swelling mid humerus at a former IV site. Gas and fluid was seen in the arm on the CT abdomen and pelvis. EXAM: CT OF THE RIGHT FOREARM WITHOUT CONTRAST; CT OF THE RIGHT HUMERUS WITHOUT CONTRAST TECHNIQUE: Multidetector CT imaging was performed according to the standard protocol. Multiplanar CT image reconstructions were also generated. COMPARISON:  CT abdomen and pelvis 06/27/2018 FINDINGS: Bones/Joint/Cartilage Degenerative changes in the right shoulder. Prominent olecranon spur. Degenerative changes in the radiocarpal joints. No evidence of acute fracture or dislocation or focal bone lesion involving the humerus, radius, or ulna. Would Ligaments Suboptimally assessed by CT. Muscles and Tendons Diffuse muscular atrophy demonstrated in the right arm. The biceps muscle and sheath appear expanded with poor definition of the intermuscular fat layers. This may be due to infiltration, edema, or hematoma. Soft tissues There is soft tissue swelling, infiltration, and gas present in the anterior aspect of the right arm involving the anterior muscle compartments. There is diffuse infiltration throughout the subcutaneous fat of  the right arm mostly anteriorly but also extending posteriorly. Soft tissue infiltration and gas extend from the subcoracoid region down to the level of the elbow. Infiltration of the subcutaneous fat extends inferiorly throughout the right forearm. Muscle compartments of the right forearm appear intact. Changes could represent edema, cellulitis, abscess, or hematoma or combination of these things. IMPRESSION: 1. Degenerative changes in the right shoulder and radiocarpal joints. No evidence of acute fracture or dislocation. 2. Diffuse soft tissue swelling, infiltration, and gas in the anterior aspect of the right arm involving the anterior muscle compartments and surrounding subcutaneous soft tissues. Changes could represent edema, cellulitis, abscess, or combination of these findings. Electronically Signed   By: Lucienne Capers M.D.   On: 06/27/2018 21:19   Dg Chest Port 1 View  Result Date: 06/27/2018 CLINICAL DATA:  Abdominal pain, fever. History of atrial fibrillation, metastatic pancreatic malignancy. EXAM: PORTABLE CHEST 1 VIEW COMPARISON:  Chest x-ray of June 05, 2018 FINDINGS: The lungs are adequately inflated and clear. The heart and pulmonary vascularity are normal. The mediastinum is normal in width. There is calcification in the wall of the aortic arch. The porta catheter tip projects at the junction of the right and left brachiocephalic veins. The bony thorax is unremarkable. IMPRESSION: There is no active cardiopulmonary disease. Thoracic aortic atherosclerosis. Electronically Signed   By: David  Martinique M.D.   On: 06/27/2018 14:55    Review of Systems  Constitutional: Positive for fever and malaise/fatigue.  Gastrointestinal: Positive for abdominal pain.  All other systems reviewed and are negative. Review of systems also positive for abdominal pain. Blood pressure (!) 92/55, pulse 91, temperature 98 F (36.7 C), temperature source Axillary, resp. rate 20, height 5' 7"  (1.702 m), weight  68 kg, SpO2 98 %. Physical Exam  HENT:  Head: Normocephalic.  Eyes: Pupils are equal, round, and reactive to light.  Neck: Normal range of motion.  Cardiovascular: Normal rate.  Neurological: She is alert.  Skin: Skin is warm.  Psychiatric: She has a normal mood and affect.  Chronically ill-appearing female Ortho exam of the left arm demonstrates full active and passive range of motion of the elbow wrist and shoulder.  Radial pulses intact.  On the right arm there is mild swelling proximally around the humerus.  No induration or erythema or significant tenderness to palpation except in the central portion.  Here it is only mildly tender.  No definite soft tissue crepitus.  No axillary lymphadenopathy is present.  No pain to palpation of the forearm or hand.  Radial pulse is intact.  There is an area in the proximal antecubital fossa where an IV had been placed.  No erythema induration or drainage around this area.  Bilateral lower extremities demonstrate reasonable ankle dorsiflexion and plantarflexion with intact sensation present.  No knee effusions. Assessment/Plan: Impression is gas in the soft tissue of the right arm in a patient who has elevated lactic acid and who has elevated temperature and white count.  She has abdominal pain which is her chief complaint.  She also has a history of metastatic pancreatic cancer.  Her arm is really not behaving as if there is acute infection present.  Nonetheless with gas in the soft tissues and no other great explanation I think we are obligated to surgically explore and decompress that area.  The risk and benefits are discussed with the patient including but not limited to persistent infection nerve and vessel damage and potential need for further surgery.  All questions answered.  Potassium level has been supplemented in the emergency room.  Lauren Richard 06/27/2018, 10:56 PM

## 2018-06-27 NOTE — Progress Notes (Signed)
A consult was received from an ED physician for cefepime per pharmacy dosing.  The patient's profile has been reviewed for ht/wt/allergies/indication/available labs.   A one time order has been placed for cefepime 2 gm IV x1, Flagyl 500 IV x1 also ordered.    Further antibiotics/pharmacy consults should be ordered by admitting physician if indicated.                       Thank you, Eudelia Bunch, Pharm.D [phone] 06/27/2018 3:20 PM

## 2018-06-27 NOTE — ED Notes (Signed)
Dentures  Rings x 4 gold in pink  In 2 different pink containers labeled   ------ Pt hat  ----

## 2018-06-27 NOTE — ED Triage Notes (Signed)
Per EMS- Patient c/o abdominal pain and family reported a temp of 100.2. EMS obtained a tympanic of 104.0 EMS gave Tylenol 1000 mg po prior to arrival to the ED. Patient reports history of atrial fib and pancreatic cancer with mets to the liver.Lauren Richard

## 2018-06-27 NOTE — ED Notes (Signed)
Ed PA Sophia made aware and will pass along too Dr. Ralene Bathe that patient has a critical Troponin value of 0.15

## 2018-06-27 NOTE — ED Provider Notes (Addendum)
Pt signed out by Dr. Ralene Bathe pending results of tests.  Potassium is low which is supplemented with IV KCl.   Pt does have:  IMPRESSION: 1. Progression of liver metastatic disease. 2. New peritoneal carcinomatosis with widespread omental metastases. 3. New nodular soft tissue in the central mesentery and left para-aortic regions, compatible with infiltrative adenopathy or locally recurrent tumor. 4. New mild right pericardiophrenic adenopathy compatible with metastatic disease. 5. Postsurgical changes from Whipple procedure, with no evidence of bowel obstruction. 6. Moderate rectal stool with mild circumferential rectal wall thickening and perirectal fat stranding and presacral space fluid, cannot exclude stercoral colitis. 7. Partial visualization of extensive deep soft tissue fluid and gas in the right upper extremity, cannot exclude gangrene/necrotizing fasciitis. 8.  Aortic Atherosclerosis (ICD10-I70.0).   I did a CT of the humerus/forearm which showed:  IMPRESSION: 1. Degenerative changes in the right shoulder and radiocarpal joints. No evidence of acute fracture or dislocation. 2. Diffuse soft tissue swelling, infiltration, and gas in the anterior aspect of the right arm involving the anterior muscle compartments and surrounding subcutaneous soft tissues. Changes could represent edema, cellulitis, abscess, or combination of these Findings.  Pt was given Maxipime and Flagyl earlier today.  I will add vancomycin and clindamycin for possible necrotizing fasciitis.  The pt is still hypotensive, so she was given additional IVFs.    Results of CT scans were d/w Dr. Barry Dienes (surgery) who said this was an ortho problem.  I spoke with Dr. Marlou Sa (ortho) who will see her.  Dr. Marlou Sa will take pt to the OR to explore area in question.  Both hospitalists know about pt, but will wait for admission until after OR in case she needs to go to the ICU.  CRITICAL CARE Performed by: Isla Pence   Total critical care time: 60 minutes  Critical care time was exclusive of separately billable procedures and treating other patients.  Critical care was necessary to treat or prevent imminent or life-threatening deterioration.  Critical care was time spent personally by me on the following activities: development of treatment plan with patient and/or surrogate as well as nursing, discussions with consultants, evaluation of patient's response to treatment, examination of patient, obtaining history from patient or surrogate, ordering and performing treatments and interventions, ordering and review of laboratory studies, ordering and review of radiographic studies, pulse oximetry and re-evaluation of patient's condition.   Isla Pence, MD 06/27/18 [id number]    Isla Pence, MD 06/27/18 [phone]

## 2018-06-27 NOTE — ED Notes (Signed)
Failed attempt to collect labs. RN and MD was notified.

## 2018-06-27 NOTE — ED Notes (Signed)
Person of contact paula ritter  [phone]

## 2018-06-28 ENCOUNTER — Other Ambulatory Visit: Payer: Self-pay

## 2018-06-28 ENCOUNTER — Emergency Department (HOSPITAL_COMMUNITY): Payer: PPO | Admitting: Registered Nurse

## 2018-06-28 DIAGNOSIS — C259 Malignant neoplasm of pancreas, unspecified: Secondary | ICD-10-CM | POA: Diagnosis not present

## 2018-06-28 DIAGNOSIS — A4159 Other Gram-negative sepsis: Secondary | ICD-10-CM | POA: Diagnosis present

## 2018-06-28 DIAGNOSIS — J189 Pneumonia, unspecified organism: Secondary | ICD-10-CM | POA: Diagnosis not present

## 2018-06-28 DIAGNOSIS — A419 Sepsis, unspecified organism: Secondary | ICD-10-CM | POA: Diagnosis present

## 2018-06-28 DIAGNOSIS — I10 Essential (primary) hypertension: Secondary | ICD-10-CM | POA: Diagnosis present

## 2018-06-28 DIAGNOSIS — R7989 Other specified abnormal findings of blood chemistry: Secondary | ICD-10-CM | POA: Diagnosis not present

## 2018-06-28 DIAGNOSIS — Z7189 Other specified counseling: Secondary | ICD-10-CM | POA: Diagnosis not present

## 2018-06-28 DIAGNOSIS — N390 Urinary tract infection, site not specified: Secondary | ICD-10-CM | POA: Diagnosis present

## 2018-06-28 DIAGNOSIS — K5900 Constipation, unspecified: Secondary | ICD-10-CM | POA: Diagnosis present

## 2018-06-28 DIAGNOSIS — B957 Other staphylococcus as the cause of diseases classified elsewhere: Secondary | ICD-10-CM | POA: Diagnosis not present

## 2018-06-28 DIAGNOSIS — E873 Alkalosis: Secondary | ICD-10-CM | POA: Diagnosis present

## 2018-06-28 DIAGNOSIS — C800 Disseminated malignant neoplasm, unspecified: Secondary | ICD-10-CM | POA: Diagnosis not present

## 2018-06-28 DIAGNOSIS — R8271 Bacteriuria: Secondary | ICD-10-CM | POA: Diagnosis not present

## 2018-06-28 DIAGNOSIS — R64 Cachexia: Secondary | ICD-10-CM | POA: Diagnosis present

## 2018-06-28 DIAGNOSIS — K5289 Other specified noninfective gastroenteritis and colitis: Secondary | ICD-10-CM | POA: Diagnosis present

## 2018-06-28 DIAGNOSIS — C786 Secondary malignant neoplasm of retroperitoneum and peritoneum: Secondary | ICD-10-CM | POA: Diagnosis present

## 2018-06-28 DIAGNOSIS — Z9221 Personal history of antineoplastic chemotherapy: Secondary | ICD-10-CM | POA: Diagnosis not present

## 2018-06-28 DIAGNOSIS — E876 Hypokalemia: Secondary | ICD-10-CM

## 2018-06-28 DIAGNOSIS — T797XXA Traumatic subcutaneous emphysema, initial encounter: Secondary | ICD-10-CM | POA: Diagnosis present

## 2018-06-28 DIAGNOSIS — Z86711 Personal history of pulmonary embolism: Secondary | ICD-10-CM | POA: Diagnosis not present

## 2018-06-28 DIAGNOSIS — N179 Acute kidney failure, unspecified: Secondary | ICD-10-CM | POA: Diagnosis present

## 2018-06-28 DIAGNOSIS — Z90411 Acquired partial absence of pancreas: Secondary | ICD-10-CM | POA: Diagnosis not present

## 2018-06-28 DIAGNOSIS — I4891 Unspecified atrial fibrillation: Secondary | ICD-10-CM | POA: Diagnosis present

## 2018-06-28 DIAGNOSIS — Z515 Encounter for palliative care: Secondary | ICD-10-CM | POA: Diagnosis not present

## 2018-06-28 DIAGNOSIS — C801 Malignant (primary) neoplasm, unspecified: Secondary | ICD-10-CM

## 2018-06-28 DIAGNOSIS — C250 Malignant neoplasm of head of pancreas: Secondary | ICD-10-CM | POA: Diagnosis present

## 2018-06-28 DIAGNOSIS — E039 Hypothyroidism, unspecified: Secondary | ICD-10-CM | POA: Diagnosis present

## 2018-06-28 DIAGNOSIS — G9341 Metabolic encephalopathy: Secondary | ICD-10-CM | POA: Diagnosis present

## 2018-06-28 DIAGNOSIS — R7881 Bacteremia: Secondary | ICD-10-CM | POA: Diagnosis not present

## 2018-06-28 DIAGNOSIS — E872 Acidosis: Secondary | ICD-10-CM | POA: Diagnosis present

## 2018-06-28 DIAGNOSIS — R29810 Facial weakness: Secondary | ICD-10-CM | POA: Diagnosis present

## 2018-06-28 DIAGNOSIS — Z95828 Presence of other vascular implants and grafts: Secondary | ICD-10-CM | POA: Diagnosis not present

## 2018-06-28 DIAGNOSIS — B370 Candidal stomatitis: Secondary | ICD-10-CM | POA: Diagnosis present

## 2018-06-28 DIAGNOSIS — X58XXXA Exposure to other specified factors, initial encounter: Secondary | ICD-10-CM | POA: Diagnosis present

## 2018-06-28 DIAGNOSIS — L03113 Cellulitis of right upper limb: Secondary | ICD-10-CM | POA: Diagnosis present

## 2018-06-28 DIAGNOSIS — C787 Secondary malignant neoplasm of liver and intrahepatic bile duct: Secondary | ICD-10-CM | POA: Diagnosis present

## 2018-06-28 LAB — SYNOVIAL CELL COUNT + DIFF, W/ CRYSTALS
Crystals, Fluid: NONE SEEN
Lymphocytes-Synovial Fld: 13 % (ref 0–20)
Monocyte-Macrophage-Synovial Fluid: 27 % — ABNORMAL LOW (ref 50–90)
Neutrophil, Synovial: 60 % — ABNORMAL HIGH (ref 0–25)
WBC, Synovial: 1967 /mm3 — ABNORMAL HIGH (ref 0–200)

## 2018-06-28 LAB — LACTIC ACID, PLASMA
LACTIC ACID, VENOUS: 5.3 mmol/L — AB (ref 0.5–1.9)
LACTIC ACID, VENOUS: 5.9 mmol/L — AB (ref 0.5–1.9)
Lactic Acid, Venous: 3.9 mmol/L (ref 0.5–1.9)

## 2018-06-28 LAB — GLUCOSE, CAPILLARY: Glucose-Capillary: 85 mg/dL (ref 70–99)

## 2018-06-28 LAB — BLOOD CULTURE ID PANEL (REFLEXED)
ACINETOBACTER BAUMANNII: NOT DETECTED
CANDIDA PARAPSILOSIS: NOT DETECTED
Candida albicans: NOT DETECTED
Candida glabrata: NOT DETECTED
Candida krusei: NOT DETECTED
Candida tropicalis: NOT DETECTED
ENTEROCOCCUS SPECIES: NOT DETECTED
Enterobacter cloacae complex: NOT DETECTED
Enterobacteriaceae species: NOT DETECTED
Escherichia coli: NOT DETECTED
HAEMOPHILUS INFLUENZAE: NOT DETECTED
KLEBSIELLA OXYTOCA: NOT DETECTED
Klebsiella pneumoniae: NOT DETECTED
Listeria monocytogenes: NOT DETECTED
METHICILLIN RESISTANCE: DETECTED — AB
NEISSERIA MENINGITIDIS: NOT DETECTED
Proteus species: NOT DETECTED
Pseudomonas aeruginosa: NOT DETECTED
SERRATIA MARCESCENS: NOT DETECTED
STAPHYLOCOCCUS AUREUS BCID: NOT DETECTED
STREPTOCOCCUS PNEUMONIAE: NOT DETECTED
STREPTOCOCCUS SPECIES: NOT DETECTED
Staphylococcus species: DETECTED — AB
Streptococcus agalactiae: NOT DETECTED
Streptococcus pyogenes: NOT DETECTED

## 2018-06-28 LAB — BASIC METABOLIC PANEL
Anion gap: 9 (ref 5–15)
BUN: 10 mg/dL (ref 8–23)
CO2: 20 mmol/L — ABNORMAL LOW (ref 22–32)
CREATININE: 0.98 mg/dL (ref 0.44–1.00)
Calcium: 8 mg/dL — ABNORMAL LOW (ref 8.9–10.3)
Chloride: 118 mmol/L — ABNORMAL HIGH (ref 98–111)
GFR calc Af Amer: >60 mL/min (ref >60)
GFR, EST NON AFRICAN AMERICAN: 53 mL/min — AB (ref >60)
GLUCOSE: 87 mg/dL (ref 70–99)
Potassium: 2.9 mmol/L — ABNORMAL LOW (ref 3.5–5.1)
SODIUM: 147 mmol/L — AB (ref 135–145)

## 2018-06-28 LAB — BLOOD GAS, VENOUS
Acid-base deficit: 7.6 mmol/L — ABNORMAL HIGH (ref 0.0–2.0)
Bicarbonate: 17.2 mmol/L — ABNORMAL LOW (ref 20.0–28.0)
O2 CONTENT: 2 L/min
O2 Saturation: 67.6 %
PATIENT TEMPERATURE: 98.6
PCO2 VEN: 34.2 mmHg — AB (ref 44.0–60.0)
pH, Ven: 7.322 (ref 7.250–7.430)
pO2, Ven: 40 mmHg (ref 32.0–45.0)

## 2018-06-28 LAB — CBC
HCT: 39.9 % (ref 36.0–46.0)
Hemoglobin: 12.9 g/dL (ref 12.0–15.0)
MCH: 31.5 pg (ref 26.0–34.0)
MCHC: 32.3 g/dL (ref 30.0–36.0)
MCV: 97.6 fL (ref 78.0–100.0)
PLATELETS: 270 10*3/uL (ref 150–400)
RBC: 4.09 MIL/uL (ref 3.87–5.11)
RDW: 14.7 % (ref 11.5–15.5)
WBC: 13.3 10*3/uL — AB (ref 4.0–10.5)

## 2018-06-28 LAB — TROPONIN I
Troponin I: 0.12 ng/mL (ref <0.03)
Troponin I: 0.09 ng/mL (ref <0.03)
Troponin I: 0.1 ng/mL (ref <0.03)

## 2018-06-28 LAB — MAGNESIUM: Magnesium: 1.8 mg/dL (ref 1.7–2.4)

## 2018-06-28 LAB — MRSA PCR SCREENING: MRSA BY PCR: NEGATIVE

## 2018-06-28 MED ORDER — ONDANSETRON HCL 4 MG/2ML IJ SOLN
INTRAMUSCULAR | Status: DC | PRN
  Administered 2018-06-28: 4 mg via INTRAVENOUS

## 2018-06-28 MED ORDER — POTASSIUM CHLORIDE CRYS ER 20 MEQ PO TBCR: 40.0000 meq | EXTENDED_RELEASE_TABLET | Freq: Every day | ORAL | Status: DC

## 2018-06-28 MED ORDER — METOCLOPRAMIDE HCL 5 MG PO TABS: 5.0000 mg | ORAL_TABLET | Freq: Three times a day (TID) | ORAL | Status: DC | PRN

## 2018-06-28 MED ORDER — BISACODYL 10 MG RE SUPP
10.0000 mg | Freq: Every day | RECTAL | Status: DC | PRN
  Administered 2018-06-28: 10 mg via RECTAL
  Filled 2018-06-28: qty 1

## 2018-06-28 MED ORDER — METOCLOPRAMIDE HCL 5 MG/ML IJ SOLN: 5.0000 mg | Freq: Three times a day (TID) | INTRAMUSCULAR | Status: DC | PRN

## 2018-06-28 MED ORDER — MAGNESIUM OXIDE 400 (241.3 MG) MG PO TABS
400.0000 mg | ORAL_TABLET | Freq: Every day | ORAL | Status: DC
  Administered 2018-06-28 – 2018-07-06 (×8): 400 mg via ORAL
  Filled 2018-06-28 (×10): qty 1

## 2018-06-28 MED ORDER — RIVAROXABAN 20 MG PO TABS
20.0000 mg | ORAL_TABLET | Freq: Every day | ORAL | Status: DC
  Administered 2018-06-28 – 2018-07-06 (×9): 20 mg via ORAL
  Filled 2018-06-28 (×9): qty 1

## 2018-06-28 MED ORDER — ACETAMINOPHEN 325 MG PO TABS
325.0000 mg | ORAL_TABLET | Freq: Four times a day (QID) | ORAL | Status: DC | PRN
  Administered 2018-07-02 – 2018-07-03 (×2): 650 mg via ORAL
  Filled 2018-06-28 (×2): qty 2

## 2018-06-28 MED ORDER — ROSUVASTATIN CALCIUM 20 MG PO TABS
20.0000 mg | ORAL_TABLET | Freq: Every day | ORAL | Status: DC
  Administered 2018-06-28 – 2018-07-04 (×7): 20 mg via ORAL
  Filled 2018-06-28 (×7): qty 1

## 2018-06-28 MED ORDER — TRAMADOL HCL 50 MG PO TABS
50.0000 mg | ORAL_TABLET | Freq: Two times a day (BID) | ORAL | Status: DC | PRN
  Administered 2018-06-29 – 2018-06-30 (×2): 50 mg via ORAL
  Filled 2018-06-28 (×2): qty 1

## 2018-06-28 MED ORDER — SODIUM CHLORIDE 0.9 % IV BOLUS
125.0000 mL | Freq: Once | INTRAVENOUS | Status: AC
  Administered 2018-06-28: 125 mL via INTRAVENOUS

## 2018-06-28 MED ORDER — SODIUM CHLORIDE 0.9 % IV SOLN
INTRAVENOUS | Status: DC | PRN
  Administered 2018-06-27 – 2018-06-28 (×2): via INTRAVENOUS

## 2018-06-28 MED ORDER — DOCUSATE SODIUM 100 MG PO CAPS
100.0000 mg | ORAL_CAPSULE | Freq: Every day | ORAL | Status: DC
  Filled 2018-06-28: qty 1

## 2018-06-28 MED ORDER — DOCUSATE SODIUM 100 MG PO CAPS
100.0000 mg | ORAL_CAPSULE | Freq: Two times a day (BID) | ORAL | Status: DC
  Administered 2018-06-28 – 2018-06-30 (×5): 100 mg via ORAL
  Filled 2018-06-28 (×4): qty 1

## 2018-06-28 MED ORDER — LIDOCAINE 2% (20 MG/ML) 5 ML SYRINGE
INTRAMUSCULAR | Status: DC | PRN
  Administered 2018-06-28: 80 mg via INTRAVENOUS

## 2018-06-28 MED ORDER — ETOMIDATE 2 MG/ML IV SOLN
INTRAVENOUS | Status: DC | PRN
  Administered 2018-06-28: 20 mg via INTRAVENOUS

## 2018-06-28 MED ORDER — OXYCODONE HCL 5 MG PO TABS
5.0000 mg | ORAL_TABLET | ORAL | Status: DC | PRN
  Administered 2018-06-28 – 2018-06-30 (×4): 5 mg via ORAL
  Administered 2018-07-01: 10 mg via ORAL
  Filled 2018-06-28 (×6): qty 1

## 2018-06-28 MED ORDER — ONDANSETRON HCL 4 MG PO TABS: 4.0000 mg | ORAL_TABLET | Freq: Four times a day (QID) | ORAL | Status: DC | PRN

## 2018-06-28 MED ORDER — PANTOPRAZOLE SODIUM 40 MG PO TBEC
40.0000 mg | DELAYED_RELEASE_TABLET | Freq: Every day | ORAL | Status: DC
  Administered 2018-06-28 – 2018-07-06 (×8): 40 mg via ORAL
  Filled 2018-06-28 (×10): qty 1

## 2018-06-28 MED ORDER — VANCOMYCIN HCL 10 G IV SOLR
1250.0000 mg | INTRAVENOUS | Status: DC
  Administered 2018-06-28 – 2018-07-03 (×4): 1250 mg via INTRAVENOUS
  Filled 2018-06-28 (×4): qty 1250

## 2018-06-28 MED ORDER — ENSURE ENLIVE PO LIQD
237.0000 mL | Freq: Three times a day (TID) | ORAL | Status: DC
  Administered 2018-06-28 – 2018-07-04 (×12): 237 mL via ORAL

## 2018-06-28 MED ORDER — SODIUM CHLORIDE 0.9 % IV SOLN
INTRAVENOUS | Status: DC | PRN
  Administered 2018-06-28: 30 ug/min via INTRAVENOUS

## 2018-06-28 MED ORDER — NYSTATIN 100000 UNIT/ML MT SUSP
5.0000 mL | Freq: Four times a day (QID) | OROMUCOSAL | Status: DC
  Administered 2018-06-28 – 2018-07-06 (×33): 500000 [IU] via ORAL
  Filled 2018-06-28 (×34): qty 5

## 2018-06-28 MED ORDER — POTASSIUM CHLORIDE 10 MEQ/100ML IV SOLN
10.0000 meq | INTRAVENOUS | Status: AC
  Administered 2018-06-28 (×4): 10 meq via INTRAVENOUS
  Filled 2018-06-28 (×8): qty 100

## 2018-06-28 MED ORDER — SODIUM CHLORIDE 0.9% FLUSH: 10.0000 mL | INTRAVENOUS | Status: DC | PRN

## 2018-06-28 MED ORDER — POTASSIUM CHLORIDE CRYS ER 20 MEQ PO TBCR
40.0000 meq | EXTENDED_RELEASE_TABLET | Freq: Every day | ORAL | Status: DC
  Administered 2018-06-29 – 2018-07-06 (×7): 40 meq via ORAL
  Filled 2018-06-28 (×9): qty 2

## 2018-06-28 MED ORDER — FENTANYL CITRATE (PF) 100 MCG/2ML IJ SOLN: 25.0000 ug | INTRAMUSCULAR | Status: DC | PRN

## 2018-06-28 MED ORDER — COLCHICINE 0.6 MG PO TABS
0.6000 mg | ORAL_TABLET | Freq: Once | ORAL | Status: AC
  Administered 2018-06-28: 0.6 mg via ORAL
  Filled 2018-06-28: qty 1

## 2018-06-28 MED ORDER — SODIUM CHLORIDE 0.9 % IV SOLN
1.0000 g | Freq: Two times a day (BID) | INTRAVENOUS | Status: DC
  Administered 2018-06-28 – 2018-06-29 (×3): 1 g via INTRAVENOUS
  Filled 2018-06-28 (×5): qty 1

## 2018-06-28 MED ORDER — PROMETHAZINE HCL 25 MG/ML IJ SOLN: 6.2500 mg | INTRAMUSCULAR | Status: DC | PRN

## 2018-06-28 MED ORDER — LIDOCAINE 5 % EX PTCH
1.0000 | MEDICATED_PATCH | Freq: Two times a day (BID) | CUTANEOUS | Status: DC
  Administered 2018-06-28 – 2018-07-08 (×21): 1 via TRANSDERMAL
  Filled 2018-06-28 (×23): qty 1

## 2018-06-28 MED ORDER — ACETAMINOPHEN 500 MG PO TABS
500.0000 mg | ORAL_TABLET | Freq: Two times a day (BID) | ORAL | Status: DC
  Administered 2018-06-28 – 2018-07-06 (×17): 500 mg via ORAL
  Filled 2018-06-28 (×20): qty 1

## 2018-06-28 MED ORDER — 0.9 % SODIUM CHLORIDE (POUR BTL) OPTIME
TOPICAL | Status: DC | PRN
  Administered 2018-06-28: 3000 mL

## 2018-06-28 MED ORDER — METOPROLOL TARTRATE 12.5 MG HALF TABLET
12.5000 mg | ORAL_TABLET | Freq: Two times a day (BID) | ORAL | Status: DC
  Administered 2018-06-29 – 2018-07-04 (×10): 12.5 mg via ORAL
  Filled 2018-06-28 (×14): qty 1

## 2018-06-28 MED ORDER — SODIUM CHLORIDE 0.9 % IV SOLN
INTRAVENOUS | Status: DC | PRN
  Administered 2018-06-28: 250 mL via INTRAVENOUS

## 2018-06-28 MED ORDER — MIRTAZAPINE 15 MG PO TABS
7.5000 mg | ORAL_TABLET | Freq: Every day | ORAL | Status: DC
  Administered 2018-06-29 – 2018-07-01 (×3): 7.5 mg via ORAL
  Filled 2018-06-28 (×4): qty 1

## 2018-06-28 MED ORDER — ONDANSETRON HCL 4 MG/2ML IJ SOLN
4.0000 mg | Freq: Four times a day (QID) | INTRAMUSCULAR | Status: DC | PRN
  Filled 2018-06-28: qty 2

## 2018-06-28 MED ORDER — FENTANYL CITRATE (PF) 100 MCG/2ML IJ SOLN
INTRAMUSCULAR | Status: DC | PRN
  Administered 2018-06-28 (×2): 25 ug via INTRAVENOUS

## 2018-06-28 MED ORDER — ADULT MULTIVITAMIN LIQUID CH
15.0000 mL | Freq: Every day | ORAL | Status: DC
  Administered 2018-06-28 – 2018-07-06 (×9): 15 mL via ORAL
  Filled 2018-06-28 (×12): qty 15

## 2018-06-28 MED ORDER — POTASSIUM CHLORIDE CRYS ER 20 MEQ PO TBCR
40.0000 meq | EXTENDED_RELEASE_TABLET | ORAL | Status: AC
  Administered 2018-06-28 (×2): 40 meq via ORAL
  Filled 2018-06-28 (×2): qty 2

## 2018-06-28 MED ORDER — PROCHLORPERAZINE MALEATE 10 MG PO TABS: 10.0000 mg | ORAL_TABLET | Freq: Four times a day (QID) | ORAL | Status: DC | PRN

## 2018-06-28 MED ORDER — SODIUM CHLORIDE 0.9 % IV SOLN
INTRAVENOUS | Status: DC
  Administered 2018-06-28 – 2018-07-04 (×6): via INTRAVENOUS

## 2018-06-28 MED ORDER — GABAPENTIN 300 MG PO CAPS
300.0000 mg | ORAL_CAPSULE | Freq: Three times a day (TID) | ORAL | Status: DC
  Administered 2018-06-28 (×3): 300 mg via ORAL
  Filled 2018-06-28 (×3): qty 1

## 2018-06-28 NOTE — Progress Notes (Signed)
Pharmacy Antibiotic Note  AKYLA VAVREK is a 82 y.o. female admitted on 06/27/2018 with sepsis.  Pharmacy has been consulted for Vancomycin, cefepime dosing.  Plan: Cefepime 2gm iv x1, then 1gm iv q12hr  Vancomycin 1gm iv x1, then 1250mg  iv q36hr  Goal AUC = 400 - 500 for all indications, except meningitis (goal AUC > 500 and Cmin 15-20 mcg/mL)   Height: 5\' 7"  (170.2 cm) Weight: 149 lb 14.6 oz (68 kg) IBW/kg (Calculated) : 61.6  Temp (24hrs), Avg:99.1 F (37.3 C), Min:97.5 F (36.4 C), Max:102.9 F (39.4 C)  Recent Labs  Lab 06/21/18 1328 06/21/18 1340 06/27/18 1435 06/27/18 1439 06/27/18 1748 06/28/18 0159  WBC 10.6*  --  15.7*  --   --  13.3*  CREATININE 1.26* 1.20* 1.06* 1.00  --   --   LATICACIDVEN  --   --   --  2.44* 2.48*  --     Estimated Creatinine Clearance: 43.6 mL/min (by C-G formula based on SCr of 1 mg/dL).    No Known Allergies  Antimicrobials this admission: Vancomycin 06/28/2018 >> Cefepime 06/27/2018 >>   Dose adjustments this admission: -  Microbiology results: -  Thank you for allowing pharmacy to be a part of this patient's care.  Nani Skillern Crowford 06/28/2018 3:00 AM

## 2018-06-28 NOTE — Progress Notes (Signed)
PCCM:  I was called by Dr. Ree Kida regarding central venous access or peripheral access.   After speaking with the family as well as the patient at bedside the patient is declining CVC placement and the niece at the bedside agrees that she would have not wanted it.   I attempted to place a US guided PIV in the left ac however I was unsuccessful.   She does have a left sided subclavian port that needs to be accessed first if possible.   Nursing was present for this discussion.   Please call PCCM for any additional questions or if we can be of any assistance.   Lauren Nash, DO [facility] 06/28/2018 6:22 PM  Personal pager: [phone] If unanswered, please page CCM On-call: [phone]

## 2018-06-28 NOTE — Progress Notes (Signed)
06-28-18  1545  IV Team Note;  Called to place a 2nd iv for more access; current iv in left upper arm was found to be infiltrated, causing severe edema in the left arm;  Ultrasound used to assess the left arm,  but only fluid is noted; no suitable veins even seen;   Right arm is restricted;  RNs aware;  No iv access at present.   Raynelle Fanning RN IV Team

## 2018-06-28 NOTE — Progress Notes (Signed)
CRITICAL VALUE ALERT  Critical Value:  Lactic acid 5.3  Date & Time Notied:  8:48 AM 06/28/2018  Provider Notified: Ree Kida  Orders Received/Actions taken: Pending.

## 2018-06-28 NOTE — Progress Notes (Signed)
All cultures negative to date.  Knee aspiration also does not appear to be infected.  Plan is to continue the wound VAC for 5 days then discontinue.  Does not look like the air in the tissues was an infectious or necrotizing process at this time.

## 2018-06-28 NOTE — Progress Notes (Signed)
Received report from Leisa Lenz regarding L PAC; chest x-ray results on 06-27-18 noted tip at junction of left & right brachiocephalic veins. Family states "it was used for chemo last month but does not give blood return". This nurse spoke with [facility] ICU caring for pt regarding tip placement not being per policy to administer vesicant drugs, such as Vancomycin which is in the pt manage orders. Pt also receiving Flagyl and Maxipime IV. Michelle,RN reported spoke with pt and family earlier about a CVC placement and they all refused at the time, but the pt stated "if the port was unable to be used she was agree to a CVC placement by the Dr". Night shift nurse, Inocencio Homes was told to notify IV nurse for further questions regarding this situation.  Braiden Presutti,RN [facility].

## 2018-06-28 NOTE — Progress Notes (Signed)
Left upper arm IV assessed after IV team RN stated that it was infiltrated. Edema was present this AM and IV does not appear to be infiltrated. IV flushes fine, skin warm to touch, and patient does not complain of pain. However, will stop fluids for now. MD made aware. MD to consult CCM for possible IJ placement, if no other option is available. Will continue to monitor.

## 2018-06-28 NOTE — Progress Notes (Signed)
PHARMACY - PHYSICIAN COMMUNICATION CRITICAL VALUE ALERT - BLOOD CULTURE IDENTIFICATION (BCID)  Lauren Richard is an 82 y.o. female who presented to [facility] on 06/27/2018 with a chief complaint of abdominal pain.  Name of physician (or Provider) Contacted: Raliegh Ip Schorr  Current antibiotics: cefepime + vancomycin  Changes to prescribed antibiotics recommended: No change to current antibiotics. Per notes, some possible issues with IV access - provider to follow up with floor RN.  Results for orders placed or performed during the hospital encounter of 06/27/18  Blood Culture ID Panel (Reflexed) (Collected: 06/27/2018  2:36 PM)  Result Value Ref Range   Enterococcus species NOT DETECTED NOT DETECTED   Listeria monocytogenes NOT DETECTED NOT DETECTED   Staphylococcus species DETECTED (A) NOT DETECTED   Staphylococcus aureus NOT DETECTED NOT DETECTED   Methicillin resistance DETECTED (A) NOT DETECTED   Streptococcus species NOT DETECTED NOT DETECTED   Streptococcus agalactiae NOT DETECTED NOT DETECTED   Streptococcus pneumoniae NOT DETECTED NOT DETECTED   Streptococcus pyogenes NOT DETECTED NOT DETECTED   Acinetobacter baumannii NOT DETECTED NOT DETECTED   Enterobacteriaceae species NOT DETECTED NOT DETECTED   Enterobacter cloacae complex NOT DETECTED NOT DETECTED   Escherichia coli NOT DETECTED NOT DETECTED   Klebsiella oxytoca NOT DETECTED NOT DETECTED   Klebsiella pneumoniae NOT DETECTED NOT DETECTED   Proteus species NOT DETECTED NOT DETECTED   Serratia marcescens NOT DETECTED NOT DETECTED   Haemophilus influenzae NOT DETECTED NOT DETECTED   Neisseria meningitidis NOT DETECTED NOT DETECTED   Pseudomonas aeruginosa NOT DETECTED NOT DETECTED   Candida albicans NOT DETECTED NOT DETECTED   Candida glabrata NOT DETECTED NOT DETECTED   Candida krusei NOT DETECTED NOT DETECTED   Candida parapsilosis NOT DETECTED NOT DETECTED   Candida tropicalis NOT DETECTED NOT DETECTED    Lenis Noon. PharmD, BCPS Clinical Pharmacist 06/28/2018  8:00 PM

## 2018-06-28 NOTE — Progress Notes (Addendum)
Dr. Valeta Harms consulted by attending to assist with PIV/CVC placement. PIV unsuccessful. Patient then questioned in regards to CVC placement. Niece at bedside. Both patient and niece (who patient states is her POA) refused a CVC. IV team consulted to access subclavian port, if possible.  Will continue to monitor.

## 2018-06-28 NOTE — Progress Notes (Signed)
CRITICAL VALUE ALERT  Critical Value:  Lactic acid 5.9  Date & Time Notied:  06/28/2018 2:50 PM  Provider Notified: Ree Kida, MD  Orders Received/Actions taken: Continue IV fluids.

## 2018-06-28 NOTE — Progress Notes (Addendum)
PT Cancellation Note  Patient Details Name: KINZLIE HARNEY MRN: [id number] DOB: 14-Jun-1936   Cancelled Treatment:    Reason Eval/Treat Not Completed: Patient at procedure or test/unavailable as well as HR 124-132 resting in bed. Will check back tomorrow. OT consult recommended.   Claretha Cooper 06/28/2018, 3:36 PM [place] Pager [phone] Office [phone]

## 2018-06-28 NOTE — Care Management Note (Signed)
Case Management Note  Patient Details  Name: Lauren Richard MRN: [id number] Date of Birth: [birth year]  Subjective/Objective:                  Labs point to urosepsis, temp 1002.9,hr=114, o2 [place],iv abx x3, wbc=13.3, urine-turbid   Action/Plan: Will follow for progression of care and clinical status. Will follow for case management needs none present at this time.  Expected Discharge Date:  (unknown)               Expected Discharge Plan:  Skilled Nursing Facility  In-House Referral:  Clinical Social Work  Discharge planning Services  CM Consult  Post Acute Care Choice:    Choice offered to:     DME Arranged:    DME Agency:     HH Arranged:    [place] Agency:     Status of Service:  In process, will continue to follow  If discussed at Long Length of Stay Meetings, dates discussed:    Additional Comments:  Leeroy Cha, RN 06/28/2018, 8:38 AM

## 2018-06-28 NOTE — Brief Op Note (Signed)
06/27/2018 - 06/28/2018  1:09 AM  PATIENT:  Lauren Richard  82 y.o. female  PRE-OPERATIVE DIAGNOSIS:  Infected right arm  POST-OPERATIVE DIAGNOSIS:  Infected right arm  PROCEDURE:  Procedure(s): IRRIGATION AND DEBRIDEMENT RIGHT ARM  SURGEON:  Surgeon(s): Meredith Pel, MD  ASSISTANT: none  ANESTHESIA:   general  EBL: 10 ml    Total I/O In: 1000 [I.V.:1000] Out: 5 [Blood:5]  BLOOD ADMINISTERED: none  DRAINS: none   LOCAL MEDICATIONS USED:  none  SPECIMEN:  Cultures x 2 tissue and fluid x 1 and knee aspirate sent for Gram stain cell count crystal and anaerobic and aerobic culture  COUNTS:  YES  TOURNIQUET:  * Missing tourniquet times found for documented tourniquets in log: [id number] *  DICTATION: .Other Dictation: Dictation Number [id number]  PLAN OF CARE: Admit to inpatient   PATIENT DISPOSITION:  PACU - hemodynamically stable

## 2018-06-28 NOTE — Anesthesia Preprocedure Evaluation (Addendum)
Anesthesia Evaluation  Patient identified by MRN, date of birth, ID band Patient awake    Reviewed: Allergy & Precautions, NPO status , Patient's Chart, lab work & pertinent test results  Airway Mallampati: II  TM Distance: >3 FB Neck ROM: Full    Dental  (+) Edentulous Upper, Edentulous Lower, Dental Advisory Given   Pulmonary neg pulmonary ROS,    Pulmonary exam normal breath sounds clear to auscultation       Cardiovascular hypertension, + Past MI  Normal cardiovascular exam Rhythm:Regular Rate:Normal  Impressions:  - Limited echo for LV function. EF 25%. Wall motion abnormalities   with preserved basal LV segments and akinesis of all the mid to   apical segments. This is suggestive of stress (Takotsubo)   cardiomyopathy but cannot rule out infarction of very large   wrap-around LAD.    Neuro/Psych TIAnegative psych ROS   GI/Hepatic Neg liver ROS,   Endo/Other  Hypothyroidism   Renal/GU negative Renal ROS  negative genitourinary   Musculoskeletal negative musculoskeletal ROS (+)   Abdominal   Peds negative pediatric ROS (+)  Hematology  (+) anemia ,   Anesthesia Other Findings   Reproductive/Obstetrics negative OB ROS                            Anesthesia Physical  Anesthesia Plan  ASA: IV and emergent  Anesthesia Plan: General   Post-op Pain Management:    Induction: Intravenous  PONV Risk Score and Plan: 2 and 3 and Ondansetron and Treatment may vary due to age or medical condition  Airway Management Planned: Oral ETT  Additional Equipment:   Intra-op Plan:   Post-operative Plan: Post-operative intubation/ventilation  Informed Consent: I have reviewed the patients History and Physical, chart, labs and discussed the procedure including the risks, benefits and alternatives for the proposed anesthesia with the patient or authorized representative who has indicated  his/her understanding and acceptance.   Dental advisory given  Plan Discussed with: CRNA and Surgeon  Anesthesia Plan Comments:       Anesthesia Quick Evaluation

## 2018-06-28 NOTE — Progress Notes (Signed)
PROGRESS NOTE    Lauren Richard  GEX:[id number] DOB: 11/19/1935 DOA: 06/27/2018 PCP: Glendale Chard, MD   Brief Narrative:  HPI On 06/28/2018 by Dr. Jennette Kettle Lauren Richard is a 82 y.o. female with medical history significant of Pancreatic cancer s/p whipple but unfortunately has mets to liver.  On Xeloda chemo.  Patient presents to the ED with reported c/o abd pain, generalized weakness, N/V, constipation.  EMS noted temp of 104 orally.  Symptoms severe and constant in nature.  Assessment & Plan   Admitted earlier today by Dr. Jennette Kettle.  See H&P for details.  Sepsis secondary to possible intra-abdominal source -Patient presented with fever, tachycardia, tachypnea with leukocytosis -Blood culture pending -UA showed rare bacteria, large leukocytes,, >50WBC -Urine culture pending  -Chest x-ray shows no active cardiopulmonary disease -Placed on vancomycin, cefepime, Flagyl -Continue IV fluids -Monitor lactic acid  Gas in Right arm noted on CT scan -With the pubic surgery consulted and appreciated, status post right arm incision and drainage.  Fluid was sent for culture and currently pending -Patient also had knee aspiration  Hypokalemia -replaced, continue to monitor -Checking magnesium level  Essential hypertension -Metoprolol initially held, will restart  Constipation -Continue Dulcolax  Elevated troponin -troponin cycled and trending downward -suspect secondary to demand ischemia  -currently chest pain free  History of PE/Afib  -Continue Xarelto   DVT Prophylaxis  Xarelto  Code Status: FUll  Family Communication: None at bedside  Disposition Plan: Admitted, continue to monitor in stepdown down given sepsis and need for frequent monitoring  Consultants None  Procedures  None  Antibiotics   Anti-infectives (From admission, onward)   Start     Dose/Rate Route Frequency Ordered Stop   06/28/18 1800  vancomycin (VANCOCIN) 1,250 mg in sodium  chloride 0.9 % 250 mL IVPB     1,250 mg 166.7 mL/hr over 90 Minutes Intravenous Every 36 hours 06/28/18 0259     06/28/18 0600  ceFEPIme (MAXIPIME) 1 g in sodium chloride 0.9 % 100 mL IVPB     1 g 200 mL/hr over 30 Minutes Intravenous Every 12 hours 06/28/18 0201     06/27/18 2215  vancomycin (VANCOCIN) IVPB 1000 mg/200 mL premix     1,000 mg 200 mL/hr over 60 Minutes Intravenous  Once 06/27/18 2205 06/28/18 0035   06/27/18 2215  clindamycin (CLEOCIN) IVPB 600 mg     600 mg 100 mL/hr over 30 Minutes Intravenous  Once 06/27/18 2205 06/27/18 2321   06/27/18 1445  ceFEPIme (MAXIPIME) 2 g in sodium chloride 0.9 % 100 mL IVPB     2 g 200 mL/hr over 30 Minutes Intravenous  Once 06/27/18 1430 06/27/18 1606   06/27/18 1445  metroNIDAZOLE (FLAGYL) IVPB 500 mg     500 mg 100 mL/hr over 60 Minutes Intravenous Every 8 hours 06/27/18 1430        Subjective:   Lauren Richard seen and examined today.  Patient states she feels better than when she first came to the hospital.  Feeling very tired and sleepy.  Continues to have abdominal pain and constipation.  Denies current chest pain, shortness of breath, nausea or vomiting, dizziness or headache.  Objective:   Vitals:   06/28/18 0900 06/28/18 1000 06/28/18 1115 06/28/18 1200  BP: 94/63 102/62 (!) 145/64 (!) 126/47  Pulse: (!) 101  (!) 125 (!) 122  Resp: 20 20 (!) 23 (!) 25  Temp:    99.7 F (37.6 C)  TempSrc:    Oral  SpO2: (!) 67%  99% 94%  Weight:      Height:        Intake/Output Summary (Last 24 hours) at 06/28/2018 1313 Last data filed at 06/28/2018 1200 Gross per 24 hour  Intake 3126.03 ml  Output 805 ml  Net 2321.03 ml   Filed Weights   06/27/18 1415  Weight: 68 kg   Data Reviewed: I have personally reviewed following labs and imaging studies  CBC: Recent Labs  Lab 06/21/18 1328 06/21/18 1340 06/27/18 1435 06/27/18 1439 06/28/18 0159  WBC 10.6*  --  15.7*  --  13.3*  NEUTROABS 7.9*  --  12.7*  --   --   HGB 12.0  12.9 11.4* 11.9* 12.9  HCT 38.3 38.0 35.4* 35.0* 39.9  MCV 100.8*  --  94.9  --  97.6  PLT 417*  --  352  --  725   Basic Metabolic Panel: Recent Labs  Lab 06/21/18 1328 06/21/18 1340 06/27/18 1435 06/27/18 1439 06/28/18 0159  NA 135 137 142 144 147*  K 4.8 4.8 2.8* 2.8* 2.9*  CL 107 105 111 109 118*  CO2 21*  --  22  --  20*  GLUCOSE 88 85 109* 105* 87  BUN 11 16 9  7* 10  CREATININE 1.26* 1.20* 1.06* 1.00 0.98  CALCIUM 8.7*  --  8.2*  --  8.0*  MG  --   --   --   --  1.8   GFR: Estimated Creatinine Clearance: 44.5 mL/min (by C-G formula based on SCr of 0.98 mg/dL). Liver Function Tests: Recent Labs  Lab 06/21/18 1328 06/27/18 1435  AST 81* 56*  ALT 30 24  ALKPHOS 251* 261*  BILITOT 1.2 0.9  PROT 7.3 7.3  ALBUMIN 1.7* 1.7*   Recent Labs  Lab 06/27/18 1435  LIPASE 23   No results for input(s): AMMONIA in the last 168 hours. Coagulation Profile: Recent Labs  Lab 06/21/18 1328  INR 3.81   Cardiac Enzymes: Recent Labs  Lab 06/28/18 0159 06/28/18 0804  TROPONINI 0.12* 0.09*   BNP (last 3 results) No results for input(s): PROBNP in the last 8760 hours. HbA1C: No results for input(s): HGBA1C in the last 72 hours. CBG: Recent Labs  Lab 06/21/18 1331  GLUCAP 74   Lipid Profile: No results for input(s): CHOL, HDL, LDLCALC, TRIG, CHOLHDL, LDLDIRECT in the last 72 hours. Thyroid Function Tests: No results for input(s): TSH, T4TOTAL, FREET4, T3FREE, THYROIDAB in the last 72 hours. Anemia Panel: No results for input(s): VITAMINB12, FOLATE, FERRITIN, TIBC, IRON, RETICCTPCT in the last 72 hours. Urine analysis:    Component Value Date/Time   COLORURINE AMBER (A) 06/27/2018 1950   APPEARANCEUR TURBID (A) 06/27/2018 1950   LABSPEC 1.017 06/27/2018 1950   PHURINE 7.0 06/27/2018 1950   GLUCOSEU NEGATIVE 06/27/2018 1950   HGBUR LARGE (A) 06/27/2018 1950   BILIRUBINUR NEGATIVE 06/27/2018 [place] NEGATIVE 06/27/2018 1950   PROTEINUR 100 (A)  06/27/2018 1950   NITRITE NEGATIVE 06/27/2018 1950   LEUKOCYTESUR LARGE (A) 06/27/2018 1950   Sepsis Labs: @LABRCNTIP (procalcitonin:4,lacticidven:4)  ) Recent Results (from the past 240 hour(s))  Blood Culture (routine x 2)     Status: None (Preliminary result)   Collection Time: 06/27/18  2:36 PM  Result Value Ref Range Status   Specimen Description   Final    BLOOD RIGHT ANTECUBITAL Performed at [facility], [place] [address], [place], [place] [address]    Special Requests   Final  BOTTLES DRAWN AEROBIC AND ANAEROBIC Blood Culture adequate volume Performed at [facility] [address] [place], [place] [address]    Culture   Final    NO GROWTH < 24 HOURS Performed at [facility] [address] [place], [place] [address]    Report Status PENDING  Incomplete  Blood Culture (routine x 2)     Status: None (Preliminary result)   Collection Time: 06/27/18  5:52 PM  Result Value Ref Range Status   Specimen Description   Final    BLOOD LEFT ARM Performed at [facility] [address], [place], [place] [address]    Special Requests   Final    BOTTLES DRAWN AEROBIC AND ANAEROBIC Blood Culture adequate volume Performed at [facility] [address] [place], [place] [address]    Culture   Final    NO GROWTH < 12 HOURS Performed at [facility] [address], [place], [place] [address]    Report Status PENDING  Incomplete  Aerobic/Anaerobic Culture (surgical/deep wound)     Status: None (Preliminary result)   Collection Time: 06/28/18 12:38 AM  Result Value Ref Range Status   Specimen Description   Final    TISSUE RIGHT ARM Performed at [facility] [address], [place], [place] [address]    Special Requests   Final    NONE Performed at [facility], [place] [address], [place], [place] [address]    Gram Stain   Final    RARE WBC PRESENT,  PREDOMINANTLY MONONUCLEAR NO ORGANISMS SEEN Performed at [facility], [place] [address], [place], [place] [address]    Culture PENDING  Incomplete   Report Status PENDING  Incomplete  Body fluid culture     Status: None (Preliminary result)   Collection Time: 06/28/18 12:56 AM  Result Value Ref Range Status   Specimen Description   Final    SYNOVIAL RIGHT KNEE Performed at [facility], [place] [address], [place], [place] [address]    Special Requests   Final    NONE Performed at [facility], [place] [address], [place], [place] [address]    Gram Stain   Final    ABUNDANT WBC PRESENT,BOTH PMN AND MONONUCLEAR NO ORGANISMS SEEN Performed at [facility], [place] [address] [place], [place] [address]    Culture PENDING  Incomplete   Report Status PENDING  Incomplete      Radiology Studies: Ct Humerus Right Wo Contrast  Result Date: 06/27/2018 CLINICAL DATA:  Swelling mid humerus at a former IV site. Gas and fluid was seen in the arm on the CT abdomen and pelvis. EXAM: CT OF THE RIGHT FOREARM WITHOUT CONTRAST; CT OF THE RIGHT HUMERUS WITHOUT CONTRAST TECHNIQUE: Multidetector CT imaging was performed according to the standard protocol. Multiplanar CT image reconstructions were also generated. COMPARISON:  CT abdomen and pelvis 06/27/2018 FINDINGS: Bones/Joint/Cartilage Degenerative changes in the right shoulder. Prominent olecranon spur. Degenerative changes in the radiocarpal joints. No evidence of acute fracture or dislocation or focal bone lesion involving the humerus, radius, or ulna. Would Ligaments Suboptimally assessed by CT. Muscles and Tendons Diffuse muscular atrophy demonstrated in the right arm. The biceps muscle and sheath appear expanded with poor definition of the intermuscular fat layers. This may be due to infiltration, edema, or hematoma. Soft tissues There is soft tissue swelling, infiltration, and gas present in the anterior aspect of  the right  arm involving the anterior muscle compartments. There is diffuse infiltration throughout the subcutaneous fat of the right arm mostly anteriorly but also extending posteriorly. Soft tissue infiltration and gas extend from the subcoracoid region down to the level of the elbow. Infiltration of the subcutaneous fat extends inferiorly throughout the right forearm. Muscle compartments of the right forearm appear intact. Changes could represent edema, cellulitis, abscess, or hematoma or combination of these things. IMPRESSION: 1. Degenerative changes in the right shoulder and radiocarpal joints. No evidence of acute fracture or dislocation. 2. Diffuse soft tissue swelling, infiltration, and gas in the anterior aspect of the right arm involving the anterior muscle compartments and surrounding subcutaneous soft tissues. Changes could represent edema, cellulitis, abscess, or combination of these findings. Electronically Signed   By: Lucienne Capers M.D.   On: 06/27/2018 21:19   Ct Abdomen Pelvis W Contrast  Result Date: 06/27/2018 CLINICAL DATA:  Abdominal pain. Fever. Pancreatic cancer status post Whipple procedure with liver metastases. EXAM: CT ABDOMEN AND PELVIS WITH CONTRAST TECHNIQUE: Multidetector CT imaging of the abdomen and pelvis was performed using the standard protocol following bolus administration of intravenous contrast. CONTRAST:  173mL ISOVUE-300 IOPAMIDOL (ISOVUE-300) INJECTION 61% COMPARISON:  03/09/2018 CT abdomen/pelvis. FINDINGS: Lower chest: No significant pulmonary nodules or acute consolidative airspace disease. New mildly enlarged right pericardiophrenic nodes up to 1.0 cm (series 2/image 13). Hepatobiliary: Poorly marginated hyperenhancing confluent masses throughout the liver appear increased in size since 03/09/2018 CT. For example, a 2.4 cm anterior liver mass (series 2/image 16), increased from 0.8 cm. A 3.1 cm posterior right liver mass (series 2/image 26), increased from 1.2 cm.  Background diffuse hepatic steatosis. Cholecystectomy. No intrahepatic biliary ductal dilatation status post biliary enteric anastomosis. Pancreas: Status post resection of the pancreatic head with pancreatic-enteric anastomosis. Stable position of pancreatic duct stent in the pancreatic body. No discrete mass in the remnant pancreas. Spleen: Normal size. No mass. Adrenals/Urinary Tract: No discrete adrenal nodules. Simple 1.5 cm posterior interpolar left renal cyst. No hydronephrosis. Symmetric delayed contrast nephrograms. Normal bladder. Stomach/Bowel: Status post resection of the distal stomach with gastrojejunostomy. No acute gastric remnant abnormality. Normal caliber small bowel with no small bowel wall thickening. Appendix not discretely visualized. Moderate distention of the rectum by stool. Mild circumferential rectal wall thickening with perirectal fat stranding and mild fluid in the presacral space. No additional sites of large bowel wall thickening. Vascular/Lymphatic: Atherosclerotic nonaneurysmal abdominal aorta. Patent hepatic, portal, splenic and right renal veins. Narrowing/occlusion of the left renal vein anterior to the abdominal aorta, new. New soft tissue in the central mesentery measuring up to 1.2 cm (series 2/image 40). New ill-defined left para-aortic soft tissue measuring up to 1.5 cm short axis (series 2/image 36). Reproductive: Status post hysterectomy, with no abnormal findings at the vaginal cuff. No adnexal mass. Other: No pneumoperitoneum. No focal fluid collection. No ascites. Extensive new nodularity throughout the omentum bilaterally, measuring up to 1.5 cm (series 2/image 43). New irregular thickening of the right pericolic gutter peritoneum (series 2/image 37). Musculoskeletal: No aggressive appearing focal osseous lesions. Moderate thoracolumbar spondylosis. Partial visualization of deep soft tissue fluid and gas throughout right upper extremity (series 2/image 5). IMPRESSION:  1. Progression of liver metastatic disease. 2. New peritoneal carcinomatosis with widespread omental metastases. 3. New nodular soft tissue in the central mesentery and left para-aortic regions, compatible with infiltrative adenopathy or locally recurrent tumor. 4. New mild right pericardiophrenic adenopathy compatible with metastatic disease. 5. Postsurgical changes from Whipple procedure, with  no evidence of bowel obstruction. 6. Moderate rectal stool with mild circumferential rectal wall thickening and perirectal fat stranding and presacral space fluid, cannot exclude stercoral colitis. 7. Partial visualization of extensive deep soft tissue fluid and gas in the right upper extremity, cannot exclude gangrene/necrotizing fasciitis. 8.  Aortic Atherosclerosis (ICD10-I70.0). These results were called by telephone at the time of interpretation on 06/27/2018 at 7:37 pm to Dr. Gilford Raid, who verbally acknowledged these results. Electronically Signed   By: Ilona Sorrel M.D.   On: 06/27/2018 19:37   Ct Forearm Right Wo Contrast  Result Date: 06/27/2018 CLINICAL DATA:  Swelling mid humerus at a former IV site. Gas and fluid was seen in the arm on the CT abdomen and pelvis. EXAM: CT OF THE RIGHT FOREARM WITHOUT CONTRAST; CT OF THE RIGHT HUMERUS WITHOUT CONTRAST TECHNIQUE: Multidetector CT imaging was performed according to the standard protocol. Multiplanar CT image reconstructions were also generated. COMPARISON:  CT abdomen and pelvis 06/27/2018 FINDINGS: Bones/Joint/Cartilage Degenerative changes in the right shoulder. Prominent olecranon spur. Degenerative changes in the radiocarpal joints. No evidence of acute fracture or dislocation or focal bone lesion involving the humerus, radius, or ulna. Would Ligaments Suboptimally assessed by CT. Muscles and Tendons Diffuse muscular atrophy demonstrated in the right arm. The biceps muscle and sheath appear expanded with poor definition of the intermuscular fat layers. This  may be due to infiltration, edema, or hematoma. Soft tissues There is soft tissue swelling, infiltration, and gas present in the anterior aspect of the right arm involving the anterior muscle compartments. There is diffuse infiltration throughout the subcutaneous fat of the right arm mostly anteriorly but also extending posteriorly. Soft tissue infiltration and gas extend from the subcoracoid region down to the level of the elbow. Infiltration of the subcutaneous fat extends inferiorly throughout the right forearm. Muscle compartments of the right forearm appear intact. Changes could represent edema, cellulitis, abscess, or hematoma or combination of these things. IMPRESSION: 1. Degenerative changes in the right shoulder and radiocarpal joints. No evidence of acute fracture or dislocation. 2. Diffuse soft tissue swelling, infiltration, and gas in the anterior aspect of the right arm involving the anterior muscle compartments and surrounding subcutaneous soft tissues. Changes could represent edema, cellulitis, abscess, or combination of these findings. Electronically Signed   By: Lucienne Capers M.D.   On: 06/27/2018 21:19   Dg Chest Port 1 View  Result Date: 06/27/2018 CLINICAL DATA:  Abdominal pain, fever. History of atrial fibrillation, metastatic pancreatic malignancy. EXAM: PORTABLE CHEST 1 VIEW COMPARISON:  Chest x-ray of June 05, 2018 FINDINGS: The lungs are adequately inflated and clear. The heart and pulmonary vascularity are normal. The mediastinum is normal in width. There is calcification in the wall of the aortic arch. The porta catheter tip projects at the junction of the right and left brachiocephalic veins. The bony thorax is unremarkable. IMPRESSION: There is no active cardiopulmonary disease. Thoracic aortic atherosclerosis. Electronically Signed   By: David  Martinique M.D.   On: 06/27/2018 14:55     Scheduled Meds: . acetaminophen  500 mg Oral Q12H  . docusate sodium  100 mg Oral BID  .  feeding supplement (ENSURE ENLIVE)  237 mL Oral TID BM  . gabapentin  300 mg Oral TID  . lidocaine  1 patch Transdermal Q12H  . magnesium oxide  400 mg Oral Daily  . mirtazapine  7.5 mg Oral QHS  . multivitamin  15 mL Oral Daily  . nystatin  5 mL Oral QID  .  pantoprazole  40 mg Oral Daily  . [START ON 06/29/2018] potassium chloride SA  40 mEq Oral Daily  . rivaroxaban  20 mg Oral Q supper  . rosuvastatin  20 mg Oral q1800  . thrombin  20,000 Units Topical Once   Continuous Infusions: . sodium chloride    . sodium chloride 75 mL/hr at 06/28/18 1200  . ceFEPime (MAXIPIME) IV Stopped (06/28/18 0612)  . metronidazole Stopped (06/28/18 1111)  . vancomycin       LOS: 0 days   Time Spent in minutes   30 minutes  Gen Clagg D.O. on 06/28/2018 at 1:13 PM  Between 7am to 7pm - Please see pager noted on [email or web address]  After 7pm go to [email or web address]  And look for the night coverage person covering for me after hours  [facility] Office  [phone]

## 2018-06-28 NOTE — H&P (Signed)
History and Physical    Lauren Richard HCW:[id number] DOB: 1936-06-26 DOA: 06/27/2018  PCP: Glendale Chard, MD  Patient coming from: Home  I have personally briefly reviewed patient's old medical records in [place]  Chief Complaint: Abd pain  HPI: Lauren Richard is a 82 y.o. female with medical history significant of Pancreatic cancer s/p whipple but unfortunately has mets to liver.  On Xeloda chemo.  Patient presents to the ED with reported c/o abd pain, generalized weakness, N/V, constipation.  EMS noted temp of 104 orally.  Symptoms severe and constant in nature.   ED Course: Tm 102.9 in ED, HR 114, initial BP 86/63 improved to 115/80 at present time.  WBC 15.7k.  CT scan of abd / pelvis showed mod stool in rectum, possible stercoral colitis, advancing metastatic disease including peritoneal carcinomatosis, they also caught site of sub Q air in the patients arm on the ABD CT.  UA suggestive of UTI.  Patient started on cefepime and flagyl initially.  CT scan of the arm showed Diffuse soft tissue swelling, infiltration, and gas in theanterior aspect of the right arm involving the anterior muscle compartments and surrounding subcutaneous soft tissues.  Necrotizing fasciitis couldn't be ruled out.  Doses of Vanc and clinda were also ordered in the ED.  Dr. Marlou Sa came in to evaluate, and took patient to OR for exploration of possible necrotizing faciitis of the arm.   Review of Systems: As per HPI otherwise 10 point review of systems negative.   Past Medical History:  Diagnosis Date  . Adenocarcinoma of head of pancreas ([place]) 11/24/2017  . Arthritis    "all over" (04/26/2017); knees, shoulder  . Essential hypertension 03/03/2017  . Family history of breast cancer   . Family history of kidney cancer   . Family history of pancreatic cancer   . Family history of prostate cancer   . History of blood transfusion 1979   "when I had my hysterectomy"  . Hyperlipidemia   .  Hypothyroidism   . Obesity 03/03/2017  . Osteoarthritis 03/03/2017  . Pneumonia    as a child    Past Surgical History:  Procedure Laterality Date  . BREAST CYST EXCISION Right    "cut 6 cysts out"  . CYST EXCISION Left    "off my toe"  . DILATION AND CURETTAGE OF UTERUS     "before hysterectomy"  . ERCP N/A 11/04/2017   Procedure: ENDOSCOPIC RETROGRADE CHOLANGIOPANCREATOGRAPHY (ERCP);  Surgeon: Carol Ada, MD;  Location: [place];  Service: Endoscopy;  Laterality: N/A;  . ESOPHAGOGASTRODUODENOSCOPY N/A 04/24/2018   Procedure: ESOPHAGOGASTRODUODENOSCOPY (EGD);  Surgeon: Jerene Bears, MD;  Location: [facility];  Service: Gastroenterology;  Laterality: N/A;  . EUS N/A 11/11/2017   Procedure: UPPER ENDOSCOPIC ULTRASOUND (EUS) LINEAR;  Surgeon: Carol Ada, MD;  Location: [facility];  Service: Endoscopy;  Laterality: N/A;  . FINE NEEDLE ASPIRATION N/A 11/11/2017   Procedure: FINE NEEDLE ASPIRATION (FNA) LINEAR;  Surgeon: Carol Ada, MD;  Location: [facility];  Service: Endoscopy;  Laterality: N/A;  . INGUINAL HERNIA REPAIR Right   . LAPAROSCOPY N/A 01/05/2018   Procedure: LAPAROSCOPY DIAGNOSTIC ERAS PATHWAY;  Surgeon: Stark Klein, MD;  Location: [facility];  Service: General;  Laterality: N/A;  [place]  . PORTACATH PLACEMENT Left 04/11/2018   Procedure: INSERTION PORT-A-CATH;  Surgeon: Stark Klein, MD;  Location: [place];  Service: General;  Laterality: Left;  . REDUCTION MAMMAPLASTY Bilateral 1980  . TONSILLECTOMY     "when I small"  .  TOTAL ABDOMINAL HYSTERECTOMY  1979  . TOTAL KNEE ARTHROPLASTY Left 04/26/2017   Procedure: LEFT TOTAL KNEE ARTHROPLASTY;  Surgeon: [name], MD;  Location: [place];  Service: Orthopedics;  Laterality: Left;  . WHIPPLE PROCEDURE N/A 01/05/2018   Procedure: WHIPPLE PROCEDURE;  Surgeon: Stark Klein, MD;  Location: [place];  Service: General;  Laterality: N/A;     reports that she has never smoked. She has never used smokeless tobacco. She  reports that she does not drink alcohol or use drugs.  No Known Allergies  Family History  Problem Relation Age of Onset  . Kidney cancer Mother 49  . Lung cancer Father 38       lung, smoker  . Congestive Heart Failure Sister   . Asthma Sister   . Atrial fibrillation Sister   . Hypertension Sister   . Diabetes Sister   . Head & neck cancer Paternal Grandmother   . Cancer Maternal Uncle        type unk  . Pancreatic cancer Paternal Aunt        dx >50  . Cancer Paternal Uncle        type unk  . Breast cancer Cousin 20  . Prostate cancer Other 62       had robotic surgery     Prior to Admission medications   Medication Sig Start Date End Date Taking? Authorizing Provider  acetaminophen (TYLENOL) 500 MG tablet Take 500 mg by mouth every 12 (twelve) hours.   Yes [provider]  amoxicillin-clavulanate (AUGMENTIN) 500-125 MG tablet Take 1 tablet by mouth every 2 (two) hours.   Yes [provider]  capecitabine (XELODA) 500 MG tablet Take 2 tablets (1000mg ) by mouth 2 times daily, within 30 min of finishing food. Take on days 1-7 and 15-21 of each 28 day cycle 05/01/18  Yes Truitt Merle, MD  clopidogrel (PLAVIX) 75 MG tablet Take 1 tablet (75 mg total) by mouth daily. 06/11/18  Yes Thurnell Lose, MD  colchicine 0.6 MG tablet Take 0.6 mg by mouth once.   Yes [provider]  docusate sodium (COLACE) 100 MG capsule Take 100 mg by mouth daily. Hold for loose stool   Yes [provider]  gabapentin (NEURONTIN) 100 MG capsule Take 2 capsules (200 mg total) by mouth 2 (two) times daily. 01/13/18  Yes Stark Klein, MD  Lidocaine (ASPERCREME LIDOCAINE) 4 % PTCH Apply 1 patch topically every 12 (twelve) hours. Lower back   Yes [provider]  magnesium oxide (MAG-OX) 400 MG tablet Take 400 mg by mouth daily.   Yes [provider]  metoprolol tartrate (LOPRESSOR) 25 MG tablet Take 0.5 tablets (12.5 mg total) by mouth 2 (two) times daily.  06/10/18  Yes Thurnell Lose, MD  mirtazapine (REMERON) 7.5 MG tablet Take 1 tablet (7.5 mg total) by mouth at bedtime. 05/23/18  Yes Truitt Merle, MD  nystatin (MYCOSTATIN) 100000 UNIT/ML suspension Take 5 mLs (500,000 Units total) by mouth 4 (four) times daily. 03/24/18  Yes Alla Feeling, NP  oxyCODONE (OXY IR/ROXICODONE) 5 MG immediate release tablet Take 1-2 tablets (5-10 mg total) by mouth every 6 (six) hours as needed for severe pain. 06/10/18  Yes Thurnell Lose, MD  pantoprazole (PROTONIX) 40 MG tablet Take 1 tablet (40 mg total) by mouth daily at 12 noon. Patient taking differently: Take 40 mg by mouth daily.  01/13/18  Yes Stark Klein, MD  potassium chloride SA (K-DUR,KLOR-CON) 20 MEQ tablet Take  2 tablets (40 mEq total) by mouth daily. 04/20/18  Yes Truitt Merle, MD  prochlorperazine (COMPAZINE) 10 MG tablet Take 10 mg by mouth every 6 (six) hours as needed for nausea or vomiting.   Yes [provider]  rivaroxaban (XARELTO) 20 MG TABS tablet Take 1 tablet (20 mg total) by mouth daily with supper. 04/04/18  Yes Sheikh, Omair Latif, DO  rosuvastatin (CRESTOR) 20 MG tablet Take 1 tablet (20 mg total) by mouth daily at 6 PM. 06/10/18  Yes Thurnell Lose, MD  traMADol (ULTRAM) 50 MG tablet Take 1 tablet (50 mg total) by mouth every 12 (twelve) hours as needed (for pain.). Patient taking differently: Take 50 mg by mouth every 8 (eight) hours. Hold if sleepy or confused 06/10/18  Yes Thurnell Lose, MD  feeding supplement, ENSURE ENLIVE, (ENSURE ENLIVE) LIQD Take 237 mLs by mouth 3 (three) times daily between meals. Patient not taking: Reported on 06/21/2018 04/25/18   Mariel Aloe, MD  lidocaine-prilocaine (EMLA) cream Apply 1 application topically as needed. 04/14/18   Truitt Merle, MD  trolamine salicylate (ASPERCREME) 10 % cream Apply 1 application topically 4 (four) times daily as needed (for knee pain/muscle pain.).     [provider]    Physical Exam: Vitals:    06/27/18 2215 06/28/18 0110 06/28/18 0115 06/28/18 0130  BP: 96/71 127/76 122/74 115/80  Pulse: 92 82 79 81  Resp: 19 15 14 17   Temp:  (!) 97.5 F (36.4 C)    TempSrc:      SpO2: 99% 100% 100% 100%  Weight:      Height:        Constitutional: Sedated, waking up from anesthesia. Eyes: PERRL, lids and conjunctivae normal ENMT: Mucous membranes are moist. Posterior pharynx clear of any exudate or lesions.Normal dentition.  Neck: normal, supple, no masses, no thyromegaly Respiratory: clear to auscultation bilaterally, no wheezing, no crackles. Normal respiratory effort. No accessory muscle use.  Cardiovascular: Regular rate and rhythm, no murmurs / rubs / gallops. No extremity edema. 2+ pedal pulses. No carotid bruits.  Abdomen: no tenderness, no masses palpated. No hepatosplenomegaly. Bowel sounds positive.  Musculoskeletal: no clubbing / cyanosis. No joint deformity upper and lower extremities. Good ROM, no contractures. Normal muscle tone.  Skin: no rashes, lesions, ulcers. No induration Neurologic: Obeying commands, but waking up from anesthesia still. Psychiatric: Sedated, waking up from anesthesia.  Labs on Admission: I have personally reviewed following labs and imaging studies  CBC: Recent Labs  Lab 06/21/18 1328 06/21/18 1340 06/27/18 1435 06/27/18 1439  WBC 10.6*  --  15.7*  --   NEUTROABS 7.9*  --  12.7*  --   HGB 12.0 12.9 11.4* 11.9*  HCT 38.3 38.0 35.4* 35.0*  MCV 100.8*  --  94.9  --   PLT 417*  --  352  --    Basic Metabolic Panel: Recent Labs  Lab 06/21/18 1328 06/21/18 1340 06/27/18 1435 06/27/18 1439  NA 135 137 142 144  K 4.8 4.8 2.8* 2.8*  CL 107 105 111 109  CO2 21*  --  22  --   GLUCOSE 88 85 109* 105*  BUN 11 16 9  7*  CREATININE 1.26* 1.20* 1.06* 1.00  CALCIUM 8.7*  --  8.2*  --    GFR: Estimated Creatinine Clearance: 43.6 mL/min (by C-G formula based on SCr of 1 mg/dL). Liver Function Tests: Recent Labs  Lab 06/21/18 1328  06/27/18 1435  AST 81* 56*  ALT 30 24  ALKPHOS 251* 261*  BILITOT 1.2 0.9  PROT 7.3 7.3  ALBUMIN 1.7* 1.7*   Recent Labs  Lab 06/27/18 1435  LIPASE 23   No results for input(s): AMMONIA in the last 168 hours. Coagulation Profile: Recent Labs  Lab 06/21/18 1328  INR 3.81   Cardiac Enzymes: No results for input(s): CKTOTAL, CKMB, CKMBINDEX, TROPONINI in the last 168 hours. BNP (last 3 results) No results for input(s): PROBNP in the last 8760 hours. HbA1C: No results for input(s): HGBA1C in the last 72 hours. CBG: Recent Labs  Lab 06/21/18 1331  GLUCAP 74   Lipid Profile: No results for input(s): CHOL, HDL, LDLCALC, TRIG, CHOLHDL, LDLDIRECT in the last 72 hours. Thyroid Function Tests: No results for input(s): TSH, T4TOTAL, FREET4, T3FREE, THYROIDAB in the last 72 hours. Anemia Panel: No results for input(s): VITAMINB12, FOLATE, FERRITIN, TIBC, IRON, RETICCTPCT in the last 72 hours. Urine analysis:    Component Value Date/Time   COLORURINE AMBER (A) 06/27/2018 1950   APPEARANCEUR TURBID (A) 06/27/2018 1950   LABSPEC 1.017 06/27/2018 1950   PHURINE 7.0 06/27/2018 1950   GLUCOSEU NEGATIVE 06/27/2018 1950   HGBUR LARGE (A) 06/27/2018 [place] NEGATIVE 06/27/2018 [place] NEGATIVE 06/27/2018 1950   PROTEINUR 100 (A) 06/27/2018 1950   NITRITE NEGATIVE 06/27/2018 1950   LEUKOCYTESUR LARGE (A) 06/27/2018 1950    Radiological Exams on Admission: Ct Humerus Right Wo Contrast  Result Date: 06/27/2018 CLINICAL DATA:  Swelling mid humerus at a former IV site. Gas and fluid was seen in the arm on the CT abdomen and pelvis. EXAM: CT OF THE RIGHT FOREARM WITHOUT CONTRAST; CT OF THE RIGHT HUMERUS WITHOUT CONTRAST TECHNIQUE: Multidetector CT imaging was performed according to the standard protocol. Multiplanar CT image reconstructions were also generated. COMPARISON:  CT abdomen and pelvis 06/27/2018 FINDINGS: Bones/Joint/Cartilage Degenerative changes in the  right shoulder. Prominent olecranon spur. Degenerative changes in the radiocarpal joints. No evidence of acute fracture or dislocation or focal bone lesion involving the humerus, radius, or ulna. Would Ligaments Suboptimally assessed by CT. Muscles and Tendons Diffuse muscular atrophy demonstrated in the right arm. The biceps muscle and sheath appear expanded with poor definition of the intermuscular fat layers. This may be due to infiltration, edema, or hematoma. Soft tissues There is soft tissue swelling, infiltration, and gas present in the anterior aspect of the right arm involving the anterior muscle compartments. There is diffuse infiltration throughout the subcutaneous fat of the right arm mostly anteriorly but also extending posteriorly. Soft tissue infiltration and gas extend from the subcoracoid region down to the level of the elbow. Infiltration of the subcutaneous fat extends inferiorly throughout the right forearm. Muscle compartments of the right forearm appear intact. Changes could represent edema, cellulitis, abscess, or hematoma or combination of these things. IMPRESSION: 1. Degenerative changes in the right shoulder and radiocarpal joints. No evidence of acute fracture or dislocation. 2. Diffuse soft tissue swelling, infiltration, and gas in the anterior aspect of the right arm involving the anterior muscle compartments and surrounding subcutaneous soft tissues. Changes could represent edema, cellulitis, abscess, or combination of these findings. Electronically Signed   By: Lucienne Capers M.D.   On: 06/27/2018 21:19   Ct Abdomen Pelvis W Contrast  Result Date: 06/27/2018 CLINICAL DATA:  Abdominal pain. Fever. Pancreatic cancer status post Whipple procedure with liver metastases. EXAM: CT ABDOMEN AND PELVIS WITH CONTRAST TECHNIQUE: Multidetector CT imaging of the abdomen and pelvis was performed using the standard protocol following bolus  administration of intravenous contrast. CONTRAST:   19mL ISOVUE-300 IOPAMIDOL (ISOVUE-300) INJECTION 61% COMPARISON:  03/09/2018 CT abdomen/pelvis. FINDINGS: Lower chest: No significant pulmonary nodules or acute consolidative airspace disease. New mildly enlarged right pericardiophrenic nodes up to 1.0 cm (series 2/image 13). Hepatobiliary: Poorly marginated hyperenhancing confluent masses throughout the liver appear increased in size since 03/09/2018 CT. For example, a 2.4 cm anterior liver mass (series 2/image 16), increased from 0.8 cm. A 3.1 cm posterior right liver mass (series 2/image 26), increased from 1.2 cm. Background diffuse hepatic steatosis. Cholecystectomy. No intrahepatic biliary ductal dilatation status post biliary enteric anastomosis. Pancreas: Status post resection of the pancreatic head with pancreatic-enteric anastomosis. Stable position of pancreatic duct stent in the pancreatic body. No discrete mass in the remnant pancreas. Spleen: Normal size. No mass. Adrenals/Urinary Tract: No discrete adrenal nodules. Simple 1.5 cm posterior interpolar left renal cyst. No hydronephrosis. Symmetric delayed contrast nephrograms. Normal bladder. Stomach/Bowel: Status post resection of the distal stomach with gastrojejunostomy. No acute gastric remnant abnormality. Normal caliber small bowel with no small bowel wall thickening. Appendix not discretely visualized. Moderate distention of the rectum by stool. Mild circumferential rectal wall thickening with perirectal fat stranding and mild fluid in the presacral space. No additional sites of large bowel wall thickening. Vascular/Lymphatic: Atherosclerotic nonaneurysmal abdominal aorta. Patent hepatic, portal, splenic and right renal veins. Narrowing/occlusion of the left renal vein anterior to the abdominal aorta, new. New soft tissue in the central mesentery measuring up to 1.2 cm (series 2/image 40). New ill-defined left para-aortic soft tissue measuring up to 1.5 cm short axis (series 2/image 36).  Reproductive: Status post hysterectomy, with no abnormal findings at the vaginal cuff. No adnexal mass. Other: No pneumoperitoneum. No focal fluid collection. No ascites. Extensive new nodularity throughout the omentum bilaterally, measuring up to 1.5 cm (series 2/image 43). New irregular thickening of the right pericolic gutter peritoneum (series 2/image 37). Musculoskeletal: No aggressive appearing focal osseous lesions. Moderate thoracolumbar spondylosis. Partial visualization of deep soft tissue fluid and gas throughout right upper extremity (series 2/image 5). IMPRESSION: 1. Progression of liver metastatic disease. 2. New peritoneal carcinomatosis with widespread omental metastases. 3. New nodular soft tissue in the central mesentery and left para-aortic regions, compatible with infiltrative adenopathy or locally recurrent tumor. 4. New mild right pericardiophrenic adenopathy compatible with metastatic disease. 5. Postsurgical changes from Whipple procedure, with no evidence of bowel obstruction. 6. Moderate rectal stool with mild circumferential rectal wall thickening and perirectal fat stranding and presacral space fluid, cannot exclude stercoral colitis. 7. Partial visualization of extensive deep soft tissue fluid and gas in the right upper extremity, cannot exclude gangrene/necrotizing fasciitis. 8.  Aortic Atherosclerosis (ICD10-I70.0). These results were called by telephone at the time of interpretation on 06/27/2018 at 7:37 pm to Dr. Gilford Raid, who verbally acknowledged these results. Electronically Signed   By: Ilona Sorrel M.D.   On: 06/27/2018 19:37   Ct Forearm Right Wo Contrast  Result Date: 06/27/2018 CLINICAL DATA:  Swelling mid humerus at a former IV site. Gas and fluid was seen in the arm on the CT abdomen and pelvis. EXAM: CT OF THE RIGHT FOREARM WITHOUT CONTRAST; CT OF THE RIGHT HUMERUS WITHOUT CONTRAST TECHNIQUE: Multidetector CT imaging was performed according to the standard protocol.  Multiplanar CT image reconstructions were also generated. COMPARISON:  CT abdomen and pelvis 06/27/2018 FINDINGS: Bones/Joint/Cartilage Degenerative changes in the right shoulder. Prominent olecranon spur. Degenerative changes in the radiocarpal joints. No evidence of acute fracture or dislocation or focal bone lesion  involving the humerus, radius, or ulna. Would Ligaments Suboptimally assessed by CT. Muscles and Tendons Diffuse muscular atrophy demonstrated in the right arm. The biceps muscle and sheath appear expanded with poor definition of the intermuscular fat layers. This may be due to infiltration, edema, or hematoma. Soft tissues There is soft tissue swelling, infiltration, and gas present in the anterior aspect of the right arm involving the anterior muscle compartments. There is diffuse infiltration throughout the subcutaneous fat of the right arm mostly anteriorly but also extending posteriorly. Soft tissue infiltration and gas extend from the subcoracoid region down to the level of the elbow. Infiltration of the subcutaneous fat extends inferiorly throughout the right forearm. Muscle compartments of the right forearm appear intact. Changes could represent edema, cellulitis, abscess, or hematoma or combination of these things. IMPRESSION: 1. Degenerative changes in the right shoulder and radiocarpal joints. No evidence of acute fracture or dislocation. 2. Diffuse soft tissue swelling, infiltration, and gas in the anterior aspect of the right arm involving the anterior muscle compartments and surrounding subcutaneous soft tissues. Changes could represent edema, cellulitis, abscess, or combination of these findings. Electronically Signed   By: Lucienne Capers M.D.   On: 06/27/2018 21:19   Dg Chest Port 1 View  Result Date: 06/27/2018 CLINICAL DATA:  Abdominal pain, fever. History of atrial fibrillation, metastatic pancreatic malignancy. EXAM: PORTABLE CHEST 1 VIEW COMPARISON:  Chest x-ray of June 05, 2018 FINDINGS: The lungs are adequately inflated and clear. The heart and pulmonary vascularity are normal. The mediastinum is normal in width. There is calcification in the wall of the aortic arch. The porta catheter tip projects at the junction of the right and left brachiocephalic veins. The bony thorax is unremarkable. IMPRESSION: There is no active cardiopulmonary disease. Thoracic aortic atherosclerosis. Electronically Signed   By: David  Martinique M.D.   On: 06/27/2018 14:55    EKG: Independently reviewed.  Assessment/Plan Principal Problem:   Sepsis ([place]) Active Problems:   Essential hypertension   Hypokalemia   Pancreatic cancer metastasized to liver [facility])   Acute lower UTI   Peritoneal carcinomatosis ([facility])   Subcutaneous emphysema ([facility])   Constipation    1. Sepsis - 1. Looks like source will be either intra-abdominal, stercoral colitis, or UTI most likely. 2. BCx pending 3. UCx ordered 4. Also has Cx from arm and knee aspirate ordered and pending as other possible sources, though it didn't sound like these looked grossly infected in the OR from what I am hearing. 5. Will leave patient on cefepime, flagyl, and vanc for the moment. 6. Repeating CBC/BMP now. 7. Check serial lactates 8. IVF: looks like shes had 6L total IVF thus far, BP looks pretty good and not tachycardic, so im going to hold off on ordering any more for the moment. 9. May wish to consider IR tap of abd tomorrow if work up all comes back negative for source.  But is certainly on coverage for SBP for the moment. 2. Gas in arm on CT scan - 1. Thankfully, it doesn't sound like there was much in the way of surgical findings and this doesn't represent necrotizing fasciitis. 3. Hypokalemia - 1. Repeat BMP Now 2. Have ordered 4 runs IV K 3. Will leave on daily PO K as well. 4. HTN - holding metoprolol given initial hypotension in ED. 5. Constipation - Will order Dulcolax supp for the constipation.  DVT prophylaxis:  Looks like Dr. Marlou Sa has ordered Xarelto to resume with tomorrow evenings dose Code Status:  Full Code Family Communication: No family in room Disposition Plan: Home after admit Consults called: Dr. Marlou Sa Admission status: Admit to inpatient - IP status for sepsis with elevated lactate, need for emergency surgery to rule out Necrotizing Fasciitis.   Etta Quill DO [facility] Pager [phone] Only works nights!  If 7AM-7PM, please contact the primary day team physician taking care of patient  [email or web address] Password [email or web address]  06/28/2018, 1:47 AM

## 2018-06-28 NOTE — Progress Notes (Signed)
Initial Nutrition Assessment  DOCUMENTATION CODES:   Non-severe (moderate) malnutrition in context of chronic illness  INTERVENTION:  - Continue Ensure Enlive TID, each supplement provides 350 kcal and 20 grams of protein. - Will order Magic Cup once/day with lunch meals, each supplement provides 290 kcal and 9 grams of protein. - Continue to encourage PO intakes.    NUTRITION DIAGNOSIS:   Moderate Malnutrition related to chronic illness, catabolic illness, cancer and cancer related treatments as evidenced by mild fat depletion, mild muscle depletion, moderate muscle depletion.  GOAL:   Patient will meet greater than or equal to 90% of their needs  MONITOR:   PO intake, Supplement acceptance, Weight trends, Labs, Skin  REASON FOR ASSESSMENT:   Malnutrition Screening Tool  ASSESSMENT:   82 y.o. female with medical history significant of pancreatic cancer s/p whipple in March 2019 found to now have mets to liver. She is on Xeloda. Patient presented to the ED with c/o abdominal pain, generalized weakness, N/V, and constipation. EMS noted temperature of 104 orally. Symptoms severe and constant in nature.  BMI indicates normal weight. No intakes documented since admission. Patient drowsy during RD visit, sister at bedside. Patient denies N/V since admission and states that abdominal pain has improved but continues to bother her. She also reports all over pain/discomfort and she initially declined NFPE but agreed as long as RD touch was very light. Given this, some areas may present with a greater degree of wasting than was able to be noted at this time.   Patient was seen by an RD at [facility] on 8/30 at which time she had reported enjoying Ensure. Patient's sister states patient drank a few sips of water and of Ensure earlier this AM but that she has not had any solid food. Patient states she has no desire to eat with ongoing abdominal pain. Encouraged her to try something at lunch time and  to continue sipping Ensure throughout the day. Patient states abdominal pain began on Sunday (9/15) and that she began having a sore throat that day as well.   Sister states that she had heard that patient had not had a BM in ~2 weeks. Patient feels this is accurate but cannot recall with certainty. Unable to obtain any further nutrition-related information at this time.   Per Dr. Juleen China note over night: patient with sepsis, s/p R arm I&D over night (d/t CT showing gas in soft tissue) and aspirate taken from R knee, patient has received 6L IVF since admission, hypokalemia, and constipation.    Medications reviewed; 100 mg Colace BID, 400 mg Mag-ox/day, 5 mL Mycostatin QID, 40 mg oral Protonix/day, 10 mEq IV KCl x1 run yesterday and x4 runs today, 40 mEq K-Dur x2 doses today.  Labs reviewed; Na: 147 mmol/L, K: 2.9 mmol/L, Cl: 118 mmol/L, Ca: 8 mg/dL. IVF; NS @ 75 mL/hr.      NUTRITION - FOCUSED PHYSICAL EXAM:    Most Recent Value  Orbital Region  Mild depletion  Upper Arm Region  No depletion  Thoracic and Lumbar Region  Unable to assess  Buccal Region  Mild depletion  Temple Region  Mild depletion  Clavicle Bone Region  Moderate depletion  Clavicle and Acromion Bone Region  Moderate depletion  Scapular Bone Region  Unable to assess  Dorsal Hand  No depletion  Patellar Region  No depletion  Anterior Thigh Region  No depletion  Posterior Calf Region  No depletion  Edema (RD Assessment)  Mild [BLE]  Hair  Reviewed  Eyes  Reviewed  Mouth  Reviewed  Skin  Reviewed  Nails  Reviewed       Diet Order:   Diet Order            Diet regular Room service appropriate? Yes; Fluid consistency: Thin  Diet effective now              EDUCATION NEEDS:   No education needs have been identified at this time  Skin:  Skin Assessment: Skin Integrity Issues: Skin Integrity Issues:: Incisions Incisions: R arm and R knee (9/18)  Last BM:  PTA/unknown  Height:   Ht Readings from  Last 1 Encounters:  06/27/18 5\' 7"  (1.702 m)    Weight:   Wt Readings from Last 1 Encounters:  06/27/18 68 kg    Ideal Body Weight:  61.36 kg  BMI:  Body mass index is 23.48 kg/m.  Estimated Nutritional Needs:   Kcal:  6734-1937 (30-33 kcal/kg)  Protein:  95-110 grams (1.4-1.6 grams/kg)  Fluid:  >/= 2 L/day     Jarome Matin, MS, RD, LDN, [facility] Inpatient Clinical Dietitian Pager # [phone] After hours/weekend pager # [phone]

## 2018-06-28 NOTE — Transfer of Care (Signed)
Immediate Anesthesia Transfer of Care Note  Patient: Lauren Richard  Procedure(s) Performed: IRRIGATION AND DEBRIDEMENT RIGHT ARM (Right )  Patient Location: PACU  Anesthesia Type:General  Level of Consciousness: awake and patient cooperative  Airway & Oxygen Therapy: Patient Spontanous Breathing and Patient connected to face mask oxygen  Post-op Assessment: Report given to RN, Post -op Vital signs reviewed and stable and Patient moving all extremities X 4  Post vital signs: stable  Last Vitals:  Vitals Value Taken Time  BP 122/74 06/28/2018  1:15 AM  Temp    Pulse 80 06/28/2018  1:17 AM  Resp 15 06/28/2018  1:17 AM  SpO2 100 % 06/28/2018  1:17 AM  Vitals shown include unvalidated device data.  Last Pain:  Vitals:   06/27/18 2127  TempSrc: Axillary  PainSc: Asleep         Complications: No apparent anesthesia complications

## 2018-06-28 NOTE — Anesthesia Postprocedure Evaluation (Signed)
Anesthesia Post Note  Patient: Lauren Richard  Procedure(s) Performed: IRRIGATION AND DEBRIDEMENT RIGHT ARM, NEEDLE ASPIRATION OF SYNOVIAL FLUID, RIGHT KNEE TO RULE OUT SEPSIS (Right )     Patient location during evaluation: PACU Anesthesia Type: General Level of consciousness: sedated Pain management: pain level controlled Vital Signs Assessment: post-procedure vital signs reviewed and stable Respiratory status: spontaneous breathing and respiratory function stable Cardiovascular status: stable Postop Assessment: no apparent nausea or vomiting Anesthetic complications: no    Last Vitals:  Vitals:   06/28/18 0230 06/28/18 0245  BP: 107/73 107/74  Pulse:    Resp: 16 17  Temp:    SpO2:      Last Pain:  Vitals:   06/28/18 0200  TempSrc:   PainSc: Asleep                 Fatina Sprankle DANIEL

## 2018-06-28 NOTE — Progress Notes (Signed)
New orders received for critical lactic acid. Will carry out orders. Will continue to monitor.

## 2018-06-28 NOTE — Anesthesia Procedure Notes (Signed)
Procedure Name: LMA Insertion Date/Time: 06/28/2018 12:21 AM Performed by: Lissa Morales, CRNA Pre-anesthesia Checklist: Patient identified, Emergency Drugs available, Suction available and Patient being monitored Patient Re-evaluated:Patient Re-evaluated prior to induction Oxygen Delivery Method: Circle system utilized Preoxygenation: Pre-oxygenation with 100% oxygen Induction Type: IV induction Ventilation: Mask ventilation without difficulty LMA: LMA with gastric port inserted LMA Size: 4.0 Tube type: Oral Number of attempts: 1 Airway Equipment and Method: Stylet and Oral airway Placement Confirmation: ETT inserted through vocal cords under direct vision,  positive ETCO2 and breath sounds checked- equal and bilateral Tube secured with: Tape Dental Injury: Teeth and Oropharynx as per pre-operative assessment

## 2018-06-28 NOTE — Op Note (Signed)
NAME: ASTA, CORBRIDGE MEDICAL RECORD [id number] ACCOUNT [id number] DATE OF BIRTH:1936/01/14 FACILITY: [facility] LOCATION: [facility] PHYSICIAN:GREGORY Randel Pigg, MD  OPERATIVE REPORT  DATE OF PROCEDURE:  06/27/2018  PREOPERATIVE DIAGNOSIS:  Right arm closed space infection.  POSTOPERATIVE DIAGNOSIS:  Possible right arm closed space infection.  PROCEDURE:  Right arm incision and drainage.    SURGEON:  Meredith Pel, MD  ASSISTANT:  None.  INDICATIONS:  The patient is an 82 year old patient with sepsis and gas in the soft tissues of the right arm.  She presents now for operative I and D after explanation of risks and benefits.  PROCEDURE IN DETAIL:  The patient was brought to the operating room where general anesthetic was induced.  Preoperative IV antibiotics were maintained.  Right arm was prepped with Hibiclens and draped in sterile manner.  Incision was made anteriorly.   Skin and subcutaneous tissue were sharply divided.  There was some air noted in the fascial planes; however, it did not appear overtly infected.  Tissue appeared viable in the skin, subcutaneous tissue, fascia and muscle.  The muscle was contractile.   The fascia over the biceps tendon was incised.  Muscle was evaluated and found to look intact.  Dissection was carried medially.  There was some gas as well in and around the neurovascular bundle.  This was also carefully dissected and the tissue  appeared healthy and viable.  No overtly infected dishwater type fluid was encountered.  The fascia was excised in two places, one anteriorly and the biceps sheath and then the second was more medial and this was sent for analysis.  Fluid was also sent  for culture.  Following this, exploratory I and D and cultures and debridement.  The arm was irrigated with 3 L of irrigating solution and the skin was reapproximated using 2-0 nylon.  This was a loose approximation.  An incisional wound VAC was placed.   At this time, the  attention was then directed towards reexamination of both knees.  In the ER several hours earlier, the knee did not have an effusion, but it did have a mild effusion.  The decision was made to proceed with knee aspiration while the  patient was asleep.  The superior lateral aspect of the right knee was prepped with alcohol and Betadine and about 40 mL of fluid was removed.  This did not appear overtly purulent.  It was sent for Gram stain, cell count, aerobic and anaerobic cultures  as well as crystals.  The patient tolerated the procedure well without immediate complications, transferred to the recovery room in stable condition.  TN/NUANCE  D:06/28/2018 T:06/28/2018 JOB:[id number]

## 2018-06-29 ENCOUNTER — Encounter (HOSPITAL_COMMUNITY): Payer: Self-pay | Admitting: Orthopedic Surgery

## 2018-06-29 ENCOUNTER — Inpatient Hospital Stay (HOSPITAL_COMMUNITY): Payer: PPO

## 2018-06-29 LAB — LACTIC ACID, PLASMA: LACTIC ACID, VENOUS: 2.7 mmol/L — AB (ref 0.5–1.9)

## 2018-06-29 LAB — COMPREHENSIVE METABOLIC PANEL
ALK PHOS: 222 U/L — AB (ref 38–126)
ALT: 26 U/L (ref 0–44)
ANION GAP: 8 (ref 5–15)
AST: 56 U/L — AB (ref 15–41)
Albumin: 1.5 g/dL — ABNORMAL LOW (ref 3.5–5.0)
BILIRUBIN TOTAL: 1 mg/dL (ref 0.3–1.2)
BUN: 10 mg/dL (ref 8–23)
CALCIUM: 7.8 mg/dL — AB (ref 8.9–10.3)
CO2: 16 mmol/L — ABNORMAL LOW (ref 22–32)
Chloride: 121 mmol/L — ABNORMAL HIGH (ref 98–111)
Creatinine, Ser: 1.06 mg/dL — ABNORMAL HIGH (ref 0.44–1.00)
GFR calc Af Amer: 56 mL/min — ABNORMAL LOW (ref >60)
GFR calc non Af Amer: 48 mL/min — ABNORMAL LOW (ref >60)
Glucose, Bld: 83 mg/dL (ref 70–99)
POTASSIUM: 3.5 mmol/L (ref 3.5–5.1)
Sodium: 145 mmol/L (ref 135–145)
TOTAL PROTEIN: 6.2 g/dL — AB (ref 6.5–8.1)

## 2018-06-29 LAB — CBC
HCT: 29.7 % — ABNORMAL LOW (ref 36.0–46.0)
Hemoglobin: 9.7 g/dL — ABNORMAL LOW (ref 12.0–15.0)
MCH: 31.1 pg (ref 26.0–34.0)
MCHC: 32.7 g/dL (ref 30.0–36.0)
MCV: 95.2 fL (ref 78.0–100.0)
Platelets: 275 10*3/uL (ref 150–400)
RBC: 3.12 MIL/uL — ABNORMAL LOW (ref 3.87–5.11)
RDW: 15.1 % (ref 11.5–15.5)
WBC: 15.6 10*3/uL — ABNORMAL HIGH (ref 4.0–10.5)

## 2018-06-29 MED ORDER — CHLORHEXIDINE GLUCONATE 0.12 % MT SOLN
15.0000 mL | Freq: Two times a day (BID) | OROMUCOSAL | Status: DC
  Administered 2018-06-29 – 2018-07-07 (×16): 15 mL via OROMUCOSAL
  Filled 2018-06-29 (×17): qty 15

## 2018-06-29 MED ORDER — ORAL CARE MOUTH RINSE
15.0000 mL | Freq: Two times a day (BID) | OROMUCOSAL | Status: DC
  Administered 2018-06-29 – 2018-07-06 (×13): 15 mL via OROMUCOSAL

## 2018-06-29 MED ORDER — SODIUM CHLORIDE 0.9 % IV SOLN
1.0000 g | INTRAVENOUS | Status: DC
  Administered 2018-06-29: 1 g via INTRAVENOUS
  Filled 2018-06-29: qty 1

## 2018-06-29 NOTE — Evaluation (Signed)
Physical Therapy Evaluation Patient Details Name: Lauren Richard MRN: [id number] DOB: Aug 17, 1936 Today's Date: 06/29/2018   History of Present Illness  82 yo female admitted with sepsis. S/P I&D R UE-wound VAC placed. Hx of met pancreatic cancer s/p whipple 12/2017, L TKA 04/2017, obesity, CHF  Clinical Impression  On eval, pt required Mod assist +2 for mobility. She was able to sit EOB with Min guard assist for ~4-5 minutes. Had pt perform LE exercises as tolerated. Pt presents with general weakness, decreased activity tolerance, impaired gait and balance. She c/o mod-severe pain "all over" with activity. Recommend SNF. Will follow and progress activity as pt is able to tolerate.    Follow Up Recommendations SNF    Equipment Recommendations  None recommended by PT    Recommendations for Other Services       Precautions / Restrictions Precautions Precautions: Fall Precaution Comments: R UE Wound VAC; bilateral restricted UE bands in place Restrictions Weight Bearing Restrictions: No      Mobility  Bed Mobility Overal bed mobility: Needs Assistance Bed Mobility: Supine to Sit;Sit to Supine     Supine to sit: Mod assist;+2 for physical assistance;+2 for safety/equipment;HOB elevated Sit to supine: Mod assist;+2 for physical assistance;+2 for safety/equipment;HOB elevated   General bed mobility comments: Multimodal cueing required. Assist for trunk and bil LEs. Utilized bedpad to aid with scooting, positioning.   Transfers                 General transfer comment: NT-due to pain and pt request to defer OOB activity on today  Ambulation/Gait                Stairs            Wheelchair Mobility    Modified Rankin (Stroke Patients Only)       Balance Overall balance assessment: Needs assistance Sitting-balance support: Feet supported;Bilateral upper extremity supported Sitting balance-Leahy Scale: Fair       Standing balance-Leahy Scale: Poor                                Pertinent Vitals/Pain Pain Assessment: Faces Faces Pain Scale: Hurts even more Pain Location: "all over" Pain Descriptors / Indicators: Sore;Guarding;Grimacing Pain Intervention(s): Limited activity within patient's tolerance;Repositioned    Home Living Family/patient expects to be discharged to:: Unsure Living Arrangements: Alone Available Help at Discharge: Family;Available PRN/intermittently Type of Home: Apartment Home Access: Stairs to enter Entrance Stairs-Rails: Right;Left Entrance Stairs-Number of Steps: full flight  Home Layout: One level Home Equipment: Walker - 2 wheels;Wheelchair - [employer];Shower seat      Prior Function Level of Independence: Needs assistance   Gait / Transfers Assistance Needed: pt reported that she was previously able to ambulate with RW  ADL's / Homemaking Assistance Needed:  assistance with bathing/dressing   Comments: niece lives with pt     Hand Dominance        Extremity/Trunk Assessment   Upper Extremity Assessment Upper Extremity Assessment: Defer to OT evaluation    Lower Extremity Assessment Lower Extremity Assessment: Generalized weakness    Cervical / Trunk Assessment Cervical / Trunk Assessment: Kyphotic  Communication   Communication: No difficulties  Cognition Arousal/Alertness: Awake/alert Behavior During Therapy: WFL for tasks assessed/performed Overall Cognitive Status: Impaired/Different from baseline Area of Impairment: Memory;Problem solving  Memory: Decreased short-term memory   Safety/Judgement: Decreased awareness of deficits   Problem Solving: Difficulty sequencing;Requires verbal cues        General Comments      Exercises General Exercises - Lower Extremity Long Arc Quad: AROM;Both;10 reps;Seated   Assessment/Plan    PT Assessment Patient needs continued PT services  PT Problem List Decreased  strength;Decreased mobility;Decreased range of motion;Decreased activity tolerance;Decreased balance;Decreased knowledge of use of DME;Pain;Decreased skin integrity;Decreased cognition       PT Treatment Interventions DME instruction;Gait training;Therapeutic activities;Functional mobility training;Balance training;Patient/family education;Therapeutic exercise    PT Goals (Current goals can be found in the Care Plan section)  Acute Rehab PT Goals Patient Stated Goal: decrease pain PT Goal Formulation: With patient Time For Goal Achievement: 07/13/18 Potential to Achieve Goals: Fair    Frequency Min 2X/week   Barriers to discharge        Co-evaluation               AM-PAC PT "6 Clicks" Daily Activity  Outcome Measure Difficulty turning over in bed (including adjusting bedclothes, sheets and blankets)?: Unable   Difficulty sitting down on and standing up from a chair with arms (e.g., wheelchair, bedside commode, etc,.)?: Unable Help needed moving to and from a bed to chair (including a wheelchair)?: Total Help needed walking in hospital room?: Total Help needed climbing 3-5 steps with a railing? : Total 6 Click Score: 5    End of Session   Activity Tolerance: Patient limited by fatigue;Patient limited by pain Patient left: in bed;with call bell/phone within reach;with bed alarm set;with family/visitor present   PT Visit Diagnosis: Muscle weakness (generalized) (M62.81);Difficulty in walking, not elsewhere classified (R26.2);Other abnormalities of gait and mobility (R26.89)    Time: 7672-0947 PT Time Calculation (min) (ACUTE ONLY): 23 min   Charges:   PT Evaluation $PT Eval Moderate Complexity: Atkins, PT Acute Rehabilitation Services Pager: [phone] Office: [phone]

## 2018-06-29 NOTE — Progress Notes (Signed)
At appx 2320 NT alerted this RN that the patient was not responding when he attempted to take her temperature. Upon assessment the patient was unresponsive to voice, movement, and sternal rub. There was minimal resistance when manually opening the eye lids. Pupils were 1 bilaterally, but reactive. Vitals were fairly stable, although pt was slightly hypotensive as she had been throughout the shift.  Multiple attempts to awaken the patient were made and unsuccessful. [facility] NP on call was notified as well as CCM on call. Orders for venous blood gas received due to bilateral upper extremity restriction. EKG was performed. After appx 30 minutes patient opened her eyes and began following commands. She was oriented x4 and unaware that she had been unresponsive. [facility] NP and CCM notified of the change in status. She is now resting comfortably. Will monitor closely. S.Ronrico Dupin, RN

## 2018-06-29 NOTE — Progress Notes (Signed)
OT Cancellation Note  Patient Details Name: Lauren Richard MRN: [id number] DOB: Dec 31, 1935   Cancelled Treatment:    Reason Eval/Treat Not Completed: Other (comment). Pt just woke up and got breakfast. Will try to return later today if schedule permits.  Solei Wubben 06/29/2018, 9:30 AM  Lesle Chris, OTR/L Acute Rehabilitation Services [phone] [facility] pager [phone] office 06/29/2018

## 2018-06-29 NOTE — Progress Notes (Addendum)
PROGRESS NOTE    DARI CARPENITO  TSV:[id number] DOB: 02/16/1936 DOA: 06/27/2018 PCP: Glendale Chard, MD   Brief Narrative:  HPI On 06/28/2018 by Dr. Jennette Kettle Lauren Richard is a 82 y.o. female with medical history significant of Pancreatic cancer s/p whipple but unfortunately has mets to liver.  On Xeloda chemo.  Patient presents to the ED with reported c/o abd pain, generalized weakness, N/V, constipation.  EMS noted temp of 104 orally.  Symptoms severe and constant in nature.  Interim history Admitted with sepsis secondary to unknown source at this time. Continues to be on antibiotics. Overnight, became unresponsive for a few minutes. CT head obtained.    Assessment & Plan   Sepsis secondary to possible intra-abdominal source vs UTI -Patient presented with fever, tachycardia, tachypnea with leukocytosis -Blood culture: Anaerobic culture shows methicillin resistant staph coag neg (likely contaminant); will repeat blood cultures  -UA showed rare bacteria, large leukocytes,, >50WBC -Urine culture >100K GNR -Chest x-ray shows no active cardiopulmonary disease -Placed on vancomycin, cefepime, Flagyl -Continue IV fluids -Monitor lactic acid  Acute altered level of consciousness -resolved -occurred overnight -patient was not responsive to voice, movement, or sternal rub -ABG obtained- however WNL -will obtain CT head  Gas in Right arm noted on CT scan -Orthopedic surgery consulted and appreciated -status post right arm incision and drainage.  Gram stain shows no organisms; culture pending -Patient also had right knee aspiration; Gram stain shows no organisms; culture pending -patient with wound VAC- per ortho: remove on "Sunday and rewrap with Ace wrap only; follow up in the office in 10 days  Hypokalemia -replaced, continue to monitor BMP -Magnesium 1.8  Essential hypertension -BP stable, continue metoprolol  Constipation -Continue ncolace -patient was able to have  bowel movement   Elevated troponin -troponin cycled and trending downward -suspect secondary to demand ischemia  -currently chest pain free  Chronic normocytic anemia -baseline hemoglobin around 10, currently 9.7 -was 11 on admission, however patient has been receiving IVF (suspect dilutional component vs patient being hemoconcentrated)  -Continue to monitor CBC  History of PE -Continue Xarelto   Adenocarcinoma of the head of the pancreas -s/p Whipple's procedure March 2019 -Follows with Dr. Feng, oncology -Was on Xeloda (currently held)  IV access -has been an issue, patient does have a port, however tip is not correct positioning and would not be used (under policy) to administer vesicant drugs (such as Vanc) -PCCM was consulted for placement of IJ or central venous access, however patient and family refused -currently has midline  Code discussion -Discussed code status with patient, states she would like to remain full code  DVT Prophylaxis  Xarelto  Code Status: FUll  Family Communication: None at bedside  Disposition Plan: Admitted, continue to monitor in stepdown down given sepsis and need for frequent monitoring  Consultants PCCM Orthopedics  Procedures  R knee aspiration R arm I&D with wound vac placement  Antibiotics   Anti-infectives (From admission, onward)   Start     Dose/Rate Route Frequency Ordered Stop   06/28/18 1800  vancomycin (VANCOCIN) 1,250 mg in sodium chloride 0.9 % 250 mL IVPB     1,250 mg 166.7 mL/hr over 90 Minutes Intravenous Every 36 hours 06/28/18 0259     06/28/18 0600  ceFEPIme (MAXIPIME) 1 g in sodium chloride 0.9 % 100 mL IVPB     1 g 200 mL/hr over 30 Minutes Intravenous Every 12 hours 06/28/18 0201     09" /17/19 2215  vancomycin (VANCOCIN)  IVPB 1000 mg/200 mL premix     1,000 mg 200 mL/hr over 60 Minutes Intravenous  Once 06/27/18 2205 06/28/18 0035   06/27/18 2215  clindamycin (CLEOCIN) IVPB 600 mg     600 mg 100 mL/hr over  30 Minutes Intravenous  Once 06/27/18 2205 06/28/18 1930   06/27/18 1445  ceFEPIme (MAXIPIME) 2 g in sodium chloride 0.9 % 100 mL IVPB     2 g 200 mL/hr over 30 Minutes Intravenous  Once 06/27/18 1430 06/27/18 1606   06/27/18 1445  metroNIDAZOLE (FLAGYL) IVPB 500 mg     500 mg 100 mL/hr over 60 Minutes Intravenous Every 8 hours 06/27/18 1430        Subjective:   [name] seen and examined today.  Patient has no complaints this morning; states she is feeling better than when she came to the hospital. Does not recall incident overnight (LOC). Denies current chest pain, shortness of breath, abdominal pain, nausea, vomiting, diarrhea, constipation, dizziness, headache. Was able to have a bowel movement.   Objective:   Vitals:   06/29/18 0600 06/29/18 0700 06/29/18 0724 06/29/18 0800  BP: 135/73 (!) 119/50  137/79  Pulse: (!) 112 (!) 108  (!) 105  Resp: 20 20  19   Temp:   (!) 97.5 F (36.4 C)   TempSrc:   Oral   SpO2: 100% 100%  100%  Weight:      Height:       Exam  General: Well developed, chronically ill appearing, NAD  HEENT: NCAT, mucous membranes moist.   Neck: Supple  Cardiovascular: S1 S2 auscultated, tachycardic   Respiratory: Clear to auscultation bilaterally, no wheezing   Abdomen: Soft, nontender, nondistended, + bowel sounds  Extremities: warm dry without cyanosis clubbing or edema of LE. RUE with wound vac  Neuro: AAOx3, nonfocal  Psych: Normal affect and demeanor   Intake/Output Summary (Last 24 hours) at 06/29/2018 0940 Last data filed at 06/29/2018 0526 Gross per 24 hour  Intake 3368.53 ml  Output 450 ml  Net 2918.53 ml   Filed Weights   06/27/18 1415  Weight: 68 kg   Data Reviewed: I have personally reviewed following labs and imaging studies  CBC: Recent Labs  Lab 06/27/18 1435 06/27/18 1439 06/28/18 0159 06/29/18 0557  WBC 15.7*  --  13.3* 15.6*  NEUTROABS 12.7*  --   --   --   HGB 11.4* 11.9* 12.9 9.7*  HCT 35.4* 35.0* 39.9  29.7*  MCV 94.9  --  97.6 95.2  PLT 352  --  270 700   Basic Metabolic Panel: Recent Labs  Lab 06/27/18 1435 06/27/18 1439 06/28/18 0159 06/29/18 0557  NA 142 144 147* 145  K 2.8* 2.8* 2.9* 3.5  CL 111 109 118* 121*  CO2 22  --  20* 16*  GLUCOSE 109* 105* 87 83  BUN 9 7* 10 10  CREATININE 1.06* 1.00 0.98 1.06*  CALCIUM 8.2*  --  8.0* 7.8*  MG  --   --  1.8  --    GFR: Estimated Creatinine Clearance: 41.2 mL/min (A) (by C-G formula based on SCr of 1.06 mg/dL (H)). Liver Function Tests: Recent Labs  Lab 06/27/18 1435 06/29/18 0557  AST 56* 56*  ALT 24 26  ALKPHOS 261* 222*  BILITOT 0.9 1.0  PROT 7.3 6.2*  ALBUMIN 1.7* 1.5*   Recent Labs  Lab 06/27/18 1435  LIPASE 23   No results for input(s): AMMONIA in the last 168 hours. Coagulation Profile: No  results for input(s): INR, PROTIME in the last 168 hours. Cardiac Enzymes: Recent Labs  Lab 06/28/18 0159 06/28/18 0804 06/28/18 1322  TROPONINI 0.12* 0.09* 0.10*   BNP (last 3 results) No results for input(s): PROBNP in the last 8760 hours. HbA1C: No results for input(s): HGBA1C in the last 72 hours. CBG: Recent Labs  Lab 06/28/18 2321  GLUCAP 85   Lipid Profile: No results for input(s): CHOL, HDL, LDLCALC, TRIG, CHOLHDL, LDLDIRECT in the last 72 hours. Thyroid Function Tests: No results for input(s): TSH, T4TOTAL, FREET4, T3FREE, THYROIDAB in the last 72 hours. Anemia Panel: No results for input(s): VITAMINB12, FOLATE, FERRITIN, TIBC, IRON, RETICCTPCT in the last 72 hours. Urine analysis:    Component Value Date/Time   COLORURINE AMBER (A) 06/27/2018 1950   APPEARANCEUR TURBID (A) 06/27/2018 1950   LABSPEC 1.017 06/27/2018 1950   PHURINE 7.0 06/27/2018 1950   GLUCOSEU NEGATIVE 06/27/2018 1950   HGBUR LARGE (A) 06/27/2018 1950   BILIRUBINUR NEGATIVE 06/27/2018 [place] NEGATIVE 06/27/2018 1950   PROTEINUR 100 (A) 06/27/2018 1950   NITRITE NEGATIVE 06/27/2018 1950   LEUKOCYTESUR LARGE (A)  06/27/2018 1950   Sepsis Labs: @LABRCNTIP (procalcitonin:4,lacticidven:4)  ) Recent Results (from the past 240 hour(s))  Blood Culture (routine x 2)     Status: None (Preliminary result)   Collection Time: 06/27/18  2:36 PM  Result Value Ref Range Status   Specimen Description   Final    BLOOD RIGHT ANTECUBITAL Performed at [facility], [place] [address] [place], [place] [address]    Special Requests   Final    BOTTLES DRAWN AEROBIC AND ANAEROBIC Blood Culture adequate volume Performed at [place] [address], [place], [place] [address]    Culture  Setup Time   Final    ANAEROBIC BOTTLE ONLY GRAM POSITIVE COCCI CRITICAL RESULT CALLED TO, READ BACK BY AND VERIFIED WITH: Shelda Jakes [facility] 06/28/18 1939 JDW Performed at [facility], [address] [place], [place] [address]    Culture GRAM POSITIVE COCCI  Final   Report Status PENDING  Incomplete  Blood Culture ID Panel (Reflexed)     Status: Abnormal   Collection Time: 06/27/18  2:36 PM  Result Value Ref Range Status   Enterococcus species NOT DETECTED NOT DETECTED Final   Listeria monocytogenes NOT DETECTED NOT DETECTED Final   Staphylococcus species DETECTED (A) NOT DETECTED Final    Comment: Methicillin (oxacillin) resistant coagulase negative staphylococcus. Possible blood culture contaminant (unless isolated from more than one blood culture draw or clinical case suggests pathogenicity). No antibiotic treatment is indicated for blood  culture contaminants. CRITICAL RESULT CALLED TO, READ BACK BY AND VERIFIED WITH: Shelda Jakes [facility] 06/28/18 1939 JDW    Staphylococcus aureus NOT DETECTED NOT DETECTED Final   Methicillin resistance DETECTED (A) NOT DETECTED Final    Comment: CRITICAL RESULT CALLED TO, READ BACK BY AND VERIFIED WITH: Shelda Jakes [facility] 06/28/18 1939 JDW    Streptococcus species NOT DETECTED NOT DETECTED Final   Streptococcus agalactiae NOT DETECTED NOT DETECTED Final     Streptococcus pneumoniae NOT DETECTED NOT DETECTED Final   Streptococcus pyogenes NOT DETECTED NOT DETECTED Final   Acinetobacter baumannii NOT DETECTED NOT DETECTED Final   Enterobacteriaceae species NOT DETECTED NOT DETECTED Final   Enterobacter cloacae complex NOT DETECTED NOT DETECTED Final   Escherichia coli NOT DETECTED NOT DETECTED Final   Klebsiella oxytoca NOT DETECTED NOT DETECTED Final   Klebsiella pneumoniae NOT DETECTED NOT  DETECTED Final   Proteus species NOT DETECTED NOT DETECTED Final   Serratia marcescens NOT DETECTED NOT DETECTED Final   Haemophilus influenzae NOT DETECTED NOT DETECTED Final   Neisseria meningitidis NOT DETECTED NOT DETECTED Final   Pseudomonas aeruginosa NOT DETECTED NOT DETECTED Final   Candida albicans NOT DETECTED NOT DETECTED Final   Candida glabrata NOT DETECTED NOT DETECTED Final   Candida krusei NOT DETECTED NOT DETECTED Final   Candida parapsilosis NOT DETECTED NOT DETECTED Final   Candida tropicalis NOT DETECTED NOT DETECTED Final    Comment: Performed at [facility], [place] [address], [place], [place] [address]  Blood Culture (routine x 2)     Status: None (Preliminary result)   Collection Time: 06/27/18  5:52 PM  Result Value Ref Range Status   Specimen Description   Final    BLOOD LEFT ARM Performed at [place] [address] [place], [place] [address]    Special Requests   Final    BOTTLES DRAWN AEROBIC AND ANAEROBIC Blood Culture adequate volume Performed at [place] [address], [place], [place] [address]    Culture  Setup Time   Final    GRAM POSITIVE COCCI AEROBIC BOTTLE ONLY CRITICAL VALUE NOTED.  VALUE IS CONSISTENT WITH PREVIOUSLY REPORTED AND CALLED VALUE. CRITICAL RESULT CALLED TO, READ BACK BY AND VERIFIED WITH: JACKSON  PHARMD AT 0930 ON [id number] BY SJW Performed at [facility], [place] [address] [place], [place] [address]    Culture GRAM POSITIVE COCCI   Final   Report Status PENDING  Incomplete  Culture, Urine     Status: Abnormal (Preliminary result)   Collection Time: 06/27/18  7:50 PM  Result Value Ref Range Status   Specimen Description   Final    URINE, CLEAN CATCH Performed at [facility], [place] [address], [place], [place] [address]    Special Requests   Final    NONE Performed at [facility], [place] [address], [place], [place] [address]    Culture >=100,000 COLONIES/mL GRAM NEGATIVE RODS (A)  Final   Report Status PENDING  Incomplete  Aerobic/Anaerobic Culture (surgical/deep wound)     Status: None (Preliminary result)   Collection Time: 06/28/18 12:38 AM  Result Value Ref Range Status   Specimen Description   Final    TISSUE RIGHT ARM Performed at [place] [address], [place], [place] [address]    Special Requests   Final    NONE Performed at [facility], [place] [address], [place], [place] [address]    Gram Stain   Final    RARE WBC PRESENT, PREDOMINANTLY MONONUCLEAR NO ORGANISMS SEEN Performed at [facility], [place] [address] [place], [place] [address]    Culture PENDING  Incomplete   Report Status PENDING  Incomplete  Body fluid culture     Status: None (Preliminary result)   Collection Time: 06/28/18 12:56 AM  Result Value Ref Range Status   Specimen Description   Final    SYNOVIAL RIGHT KNEE Performed at [facility], [place] [address] [place], [place] [address]    Special Requests   Final    NONE Performed at [facility], [place] [address] [place],  [address]    Gram Stain   Final    ABUNDANT WBC PRESENT,BOTH PMN AND MONONUCLEAR NO ORGANISMS SEEN Performed at [facility], [place]  [address] [place], [place] [address]    Culture PENDING  Incomplete   Report Status PENDING  Incomplete  MRSA PCR Screening     Status: None   Collection Time: 06/28/18 12:01 PM  Result Value Ref Range  Status   MRSA by PCR NEGATIVE NEGATIVE Final    Comment:        The GeneXpert MRSA Assay (FDA approved for NASAL specimens only), is one component of a comprehensive MRSA colonization surveillance program. It is not intended to diagnose MRSA infection nor to guide or monitor treatment for MRSA infections. Performed at [facility], [place] [address] [place], [place] [address]       Radiology Studies: Ct Humerus Right Wo Contrast  Result Date: 06/27/2018 CLINICAL DATA:  Swelling mid humerus at a former IV site. Gas and fluid was seen in the arm on the CT abdomen and pelvis. EXAM: CT OF THE RIGHT FOREARM WITHOUT CONTRAST; CT OF THE RIGHT HUMERUS WITHOUT CONTRAST TECHNIQUE: Multidetector CT imaging was performed according to the standard protocol. Multiplanar CT image reconstructions were also generated. COMPARISON:  CT abdomen and pelvis 06/27/2018 FINDINGS: Bones/Joint/Cartilage Degenerative changes in the right shoulder. Prominent olecranon spur. Degenerative changes in the radiocarpal joints. No evidence of acute fracture or dislocation or focal bone lesion involving the humerus, radius, or ulna. Would Ligaments Suboptimally assessed by CT. Muscles and Tendons Diffuse muscular atrophy demonstrated in the right arm. The biceps muscle and sheath appear expanded with poor definition of the intermuscular fat layers. This may be due to infiltration, edema, or hematoma. Soft tissues There is soft tissue swelling, infiltration, and gas present in the anterior aspect of the right arm involving the anterior muscle compartments. There is diffuse infiltration throughout the subcutaneous fat of the right arm mostly anteriorly but also extending posteriorly. Soft tissue infiltration and gas extend from the subcoracoid region down to the level of the elbow. Infiltration of the subcutaneous fat extends inferiorly throughout the right forearm. Muscle compartments of the right forearm appear  intact. Changes could represent edema, cellulitis, abscess, or hematoma or combination of these things. IMPRESSION: 1. Degenerative changes in the right shoulder and radiocarpal joints. No evidence of acute fracture or dislocation. 2. Diffuse soft tissue swelling, infiltration, and gas in the anterior aspect of the right arm involving the anterior muscle compartments and surrounding subcutaneous soft tissues. Changes could represent edema, cellulitis, abscess, or combination of these findings. Electronically Signed   By: Lucienne Capers M.D.   On: 06/27/2018 21:19   Ct Abdomen Pelvis W Contrast  Result Date: 06/27/2018 CLINICAL DATA:  Abdominal pain. Fever. Pancreatic cancer status post Whipple procedure with liver metastases. EXAM: CT ABDOMEN AND PELVIS WITH CONTRAST TECHNIQUE: Multidetector CT imaging of the abdomen and pelvis was performed using the standard protocol following bolus administration of intravenous contrast. CONTRAST:  134mL ISOVUE-300 IOPAMIDOL (ISOVUE-300) INJECTION 61% COMPARISON:  03/09/2018 CT abdomen/pelvis. FINDINGS: Lower chest: No significant pulmonary nodules or acute consolidative airspace disease. New mildly enlarged right pericardiophrenic nodes up to 1.0 cm (series 2/image 13). Hepatobiliary: Poorly marginated hyperenhancing confluent masses throughout the liver appear increased in size since 03/09/2018 CT. For example, a 2.4 cm anterior liver mass (series 2/image 16), increased from 0.8 cm. A 3.1 cm posterior right liver mass (series 2/image 26), increased from 1.2 cm. Background diffuse hepatic steatosis. Cholecystectomy. No intrahepatic biliary ductal dilatation status post biliary enteric anastomosis. Pancreas: Status post resection of the pancreatic head with pancreatic-enteric anastomosis. Stable position of pancreatic duct stent in the  pancreatic body. No discrete mass in the remnant pancreas. Spleen: Normal size. No mass. Adrenals/Urinary Tract: No discrete adrenal  nodules. Simple 1.5 cm posterior interpolar left renal cyst. No hydronephrosis. Symmetric delayed contrast nephrograms. Normal bladder. Stomach/Bowel: Status post resection of the distal stomach with gastrojejunostomy. No acute gastric remnant abnormality. Normal caliber small bowel with no small bowel wall thickening. Appendix not discretely visualized. Moderate distention of the rectum by stool. Mild circumferential rectal wall thickening with perirectal fat stranding and mild fluid in the presacral space. No additional sites of large bowel wall thickening. Vascular/Lymphatic: Atherosclerotic nonaneurysmal abdominal aorta. Patent hepatic, portal, splenic and right renal veins. Narrowing/occlusion of the left renal vein anterior to the abdominal aorta, new. New soft tissue in the central mesentery measuring up to 1.2 cm (series 2/image 40). New ill-defined left para-aortic soft tissue measuring up to 1.5 cm short axis (series 2/image 36). Reproductive: Status post hysterectomy, with no abnormal findings at the vaginal cuff. No adnexal mass. Other: No pneumoperitoneum. No focal fluid collection. No ascites. Extensive new nodularity throughout the omentum bilaterally, measuring up to 1.5 cm (series 2/image 43). New irregular thickening of the right pericolic gutter peritoneum (series 2/image 37). Musculoskeletal: No aggressive appearing focal osseous lesions. Moderate thoracolumbar spondylosis. Partial visualization of deep soft tissue fluid and gas throughout right upper extremity (series 2/image 5). IMPRESSION: 1. Progression of liver metastatic disease. 2. New peritoneal carcinomatosis with widespread omental metastases. 3. New nodular soft tissue in the central mesentery and left para-aortic regions, compatible with infiltrative adenopathy or locally recurrent tumor. 4. New mild right pericardiophrenic adenopathy compatible with metastatic disease. 5. Postsurgical changes from Whipple procedure, with no evidence  of bowel obstruction. 6. Moderate rectal stool with mild circumferential rectal wall thickening and perirectal fat stranding and presacral space fluid, cannot exclude stercoral colitis. 7. Partial visualization of extensive deep soft tissue fluid and gas in the right upper extremity, cannot exclude gangrene/necrotizing fasciitis. 8.  Aortic Atherosclerosis (ICD10-I70.0). These results were called by telephone at the time of interpretation on 06/27/2018 at 7:37 pm to Dr. Gilford Raid, who verbally acknowledged these results. Electronically Signed   By: Ilona Sorrel M.D.   On: 06/27/2018 19:37   Ct Forearm Right Wo Contrast  Result Date: 06/27/2018 CLINICAL DATA:  Swelling mid humerus at a former IV site. Gas and fluid was seen in the arm on the CT abdomen and pelvis. EXAM: CT OF THE RIGHT FOREARM WITHOUT CONTRAST; CT OF THE RIGHT HUMERUS WITHOUT CONTRAST TECHNIQUE: Multidetector CT imaging was performed according to the standard protocol. Multiplanar CT image reconstructions were also generated. COMPARISON:  CT abdomen and pelvis 06/27/2018 FINDINGS: Bones/Joint/Cartilage Degenerative changes in the right shoulder. Prominent olecranon spur. Degenerative changes in the radiocarpal joints. No evidence of acute fracture or dislocation or focal bone lesion involving the humerus, radius, or ulna. Would Ligaments Suboptimally assessed by CT. Muscles and Tendons Diffuse muscular atrophy demonstrated in the right arm. The biceps muscle and sheath appear expanded with poor definition of the intermuscular fat layers. This may be due to infiltration, edema, or hematoma. Soft tissues There is soft tissue swelling, infiltration, and gas present in the anterior aspect of the right arm involving the anterior muscle compartments. There is diffuse infiltration throughout the subcutaneous fat of the right arm mostly anteriorly but also extending posteriorly. Soft tissue infiltration and gas extend from the subcoracoid region down to  the level of the elbow. Infiltration of the subcutaneous fat extends inferiorly throughout the right forearm. Muscle compartments of the  right forearm appear intact. Changes could represent edema, cellulitis, abscess, or hematoma or combination of these things. IMPRESSION: 1. Degenerative changes in the right shoulder and radiocarpal joints. No evidence of acute fracture or dislocation. 2. Diffuse soft tissue swelling, infiltration, and gas in the anterior aspect of the right arm involving the anterior muscle compartments and surrounding subcutaneous soft tissues. Changes could represent edema, cellulitis, abscess, or combination of these findings. Electronically Signed   By: Lucienne Capers M.D.   On: 06/27/2018 21:19   Dg Chest Port 1 View  Result Date: 06/27/2018 CLINICAL DATA:  Abdominal pain, fever. History of atrial fibrillation, metastatic pancreatic malignancy. EXAM: PORTABLE CHEST 1 VIEW COMPARISON:  Chest x-ray of June 05, 2018 FINDINGS: The lungs are adequately inflated and clear. The heart and pulmonary vascularity are normal. The mediastinum is normal in width. There is calcification in the wall of the aortic arch. The porta catheter tip projects at the junction of the right and left brachiocephalic veins. The bony thorax is unremarkable. IMPRESSION: There is no active cardiopulmonary disease. Thoracic aortic atherosclerosis. Electronically Signed   By: David  Martinique M.D.   On: 06/27/2018 14:55     Scheduled Meds: . acetaminophen  500 mg Oral Q12H  . chlorhexidine  15 mL Mouth Rinse BID  . docusate sodium  100 mg Oral BID  . feeding supplement (ENSURE ENLIVE)  237 mL Oral TID BM  . lidocaine  1 patch Transdermal Q12H  . magnesium oxide  400 mg Oral Daily  . mouth rinse  15 mL Mouth Rinse q12n4p  . metoprolol tartrate  12.5 mg Oral BID  . mirtazapine  7.5 mg Oral QHS  . multivitamin  15 mL Oral Daily  . nystatin  5 mL Oral QID  . pantoprazole  40 mg Oral Daily  . potassium  chloride SA  40 mEq Oral Daily  . rivaroxaban  20 mg Oral Q supper  . rosuvastatin  20 mg Oral q1800  . thrombin  20,000 Units Topical Once   Continuous Infusions: . sodium chloride Stopped (06/28/18 2353)  . sodium chloride 75 mL/hr at 06/29/18 0500  . ceFEPime (MAXIPIME) IV Stopped (06/28/18 2308)  . metronidazole Stopped (06/29/18 0242)  . vancomycin Stopped (06/29/18 0123)     LOS: 1 day   Time Spent in minutes   45 minutes (greater than 50% of time spent with patient face to face, as well as reviewing, records, calling consults, and formulating a plan)  Cristal Ford D.O. on 06/29/2018 at 9:40 AM  Between 7am to 7pm - Please see pager noted on [email or web address]  After 7pm go to [email or web address]  And look for the night coverage person covering for me after hours  [facility] Office  [phone]

## 2018-06-29 NOTE — Progress Notes (Signed)
Patient stable Right arm wound VAC functional and intact No tissue crepitus in the right arm.  No erythema warmth.  Only mild expected postop tenderness Motor or sensory function to the hand is intact. No tissue crepitus in the forearm. Shoulder range of motion nontender  Impression at this time is no evidence of ongoing infection in that right arm.  Plan is to remove the wound VAC on Sunday and rewrap that arm with Ace wrap only.  Follow-up with me  10 days after discharge for suture removal

## 2018-06-29 NOTE — Progress Notes (Signed)
Lab unable to get blood cultures due to restrictive arm bands on both arms.

## 2018-06-29 NOTE — Progress Notes (Signed)
CRITICAL VALUE ALERT  Critical Value:  Lactic acid 2.7  Date & Time Notied:  06/29/18 1025  Provider Notified: Ree Kida MD  Orders Received/Actions taken: Awaiting orders

## 2018-06-30 LAB — BASIC METABOLIC PANEL
ANION GAP: 4 — AB (ref 5–15)
BUN: 10 mg/dL (ref 8–23)
CO2: 17 mmol/L — ABNORMAL LOW (ref 22–32)
CREATININE: 1.17 mg/dL — AB (ref 0.44–1.00)
Calcium: 7.5 mg/dL — ABNORMAL LOW (ref 8.9–10.3)
Chloride: 121 mmol/L — ABNORMAL HIGH (ref 98–111)
GFR calc non Af Amer: 43 mL/min — ABNORMAL LOW (ref >60)
GFR, EST AFRICAN AMERICAN: 50 mL/min — AB (ref >60)
Glucose, Bld: 72 mg/dL (ref 70–99)
POTASSIUM: 3.2 mmol/L — AB (ref 3.5–5.1)
SODIUM: 142 mmol/L (ref 135–145)

## 2018-06-30 LAB — CBC
HEMATOCRIT: 27.8 % — AB (ref 36.0–46.0)
HEMOGLOBIN: 9.3 g/dL — AB (ref 12.0–15.0)
MCH: 31.5 pg (ref 26.0–34.0)
MCHC: 33.5 g/dL (ref 30.0–36.0)
MCV: 94.2 fL (ref 78.0–100.0)
Platelets: 265 10*3/uL (ref 150–400)
RBC: 2.95 MIL/uL — AB (ref 3.87–5.11)
RDW: 15.2 % (ref 11.5–15.5)
WBC: 12.4 10*3/uL — AB (ref 4.0–10.5)

## 2018-06-30 LAB — URINE CULTURE

## 2018-06-30 LAB — LACTIC ACID, PLASMA: LACTIC ACID, VENOUS: 1.5 mmol/L (ref 0.5–1.9)

## 2018-06-30 MED ORDER — POTASSIUM CHLORIDE CRYS ER 20 MEQ PO TBCR
40.0000 meq | EXTENDED_RELEASE_TABLET | Freq: Once | ORAL | Status: AC
  Administered 2018-06-30: 40 meq via ORAL
  Filled 2018-06-30: qty 2

## 2018-06-30 MED ORDER — SODIUM CHLORIDE 0.9 % IV SOLN
1.0000 g | Freq: Two times a day (BID) | INTRAVENOUS | Status: DC
  Administered 2018-06-30 – 2018-07-04 (×9): 1 g via INTRAVENOUS
  Filled 2018-06-30 (×10): qty 1

## 2018-06-30 MED ORDER — CHLORHEXIDINE GLUCONATE CLOTH 2 % EX PADS
6.0000 | MEDICATED_PAD | Freq: Every day | CUTANEOUS | Status: DC
  Administered 2018-06-30 – 2018-07-04 (×4): 6 via TOPICAL

## 2018-06-30 NOTE — Care Management Note (Signed)
Case Management Note  Patient Details  Name: Lauren Richard MRN: [id number] Date of Birth: 1936-01-05  Subjective/Objective:                  Sepsis from unknown source, rt arm with wound vac versus intraabd. Action/Plan: Will follow for progression of care and clinical status. Will follow for case management needs none present at this time.  Expected Discharge Date:  (unknown)               Expected Discharge Plan:  Skilled Nursing Facility  In-House Referral:  Clinical Social Work  Discharge planning Services  CM Consult  Post Acute Care Choice:    Choice offered to:     DME Arranged:    DME Agency:     HH Arranged:    [place] Agency:     Status of Service:  In process, will continue to follow  If discussed at Long Length of Stay Meetings, dates discussed:    Additional Comments:  Leeroy Cha, RN 06/30/2018, 8:47 AM

## 2018-06-30 NOTE — Progress Notes (Signed)
Pharmacy Antibiotic Note  Lauren Richard is a 82 y.o. female admitted on 06/27/2018 with sepsis, suspected right arm infection (r/o necrotizing fascitis).  Pharmacy was initially consulted for broad spectrum antibiotics with Vancomycin and Cefepime and metronidazole dosing per MD.  S/p I&D of arm on 9/18 with no ongoing infection noted, but pending GPC in blood cultures and new Urine culture with Enterobacter cloacae.  Pharmacy is now consulted for continue vancomycin dosing and meropenem dosing.  Afebrile, WBC 12.4, SCr increased to 1.17  Plan:  Meropenem 1g IV q12h.  Continue Vancomycin 1250 mg IV q36h.  Check vancomycin levels if remains on vancomycin > 3-4 days.  Goal AUC 400-500.  Follow up renal fxn, culture results, and clinical course.  F/u ability to de-escalate antibiotics.  Height: 5\' 7"  (170.2 cm) Weight: 162 lb 14.7 oz (73.9 kg) IBW/kg (Calculated) : 61.6  Temp (24hrs), Avg:98.1 F (36.7 C), Min:97.5 F (36.4 C), Max:99.1 F (37.3 C)  Recent Labs  Lab 06/27/18 1435 06/27/18 1439  06/28/18 0159 06/28/18 0804 06/28/18 1359 06/29/18 0557 06/29/18 0950 06/30/18 0500  WBC 15.7*  --   --  13.3*  --   --  15.6*  --  12.4*  CREATININE 1.06* 1.00  --  0.98  --   --  1.06*  --  1.17*  LATICACIDVEN  --  2.44*   < > 3.9* 5.3* 5.9*  --  2.7* 1.5   < > = values in this interval not displayed.    Estimated Creatinine Clearance: 37.3 mL/min (A) (by C-G formula based on SCr of 1.17 mg/dL (H)).    No Known Allergies  Antimicrobials this admission: 9/17 Clindamycin x1 dose 9/17 Cefepime >> 9/19 9/17 Metronidazole >> 9/19 9/17 Vancomycin >> 9/19 Ceftriaxone >> 9/20 9/20 Meropenem >>   Dose adjustments this admission:  Microbiology results: 9/17 BCx: 2/2 sets, 2/4 bottles Staph Epidermidis (sens: tetracycline, vanc, clinda)     9/18 BCID:  MR staph species (CoNS) 9/17 UCx: >100k Enterobacter Cloacae (sens: cipro, gent, imi, bactrim)  9/18 Right arm tissue (I&D): no  org seen, cxt pending 9/18 Right knee synovial fluid: no org seen, cxt pending 9/18 MRSA PCR: negative  Thank you for allowing pharmacy to be a part of this patient's care.  Gretta Arab PharmD, BCPS Pager [phone] 06/30/2018 10:55 AM

## 2018-06-30 NOTE — Progress Notes (Signed)
PROGRESS NOTE    Lauren Richard  DPO:[id number] DOB: May 01, 1936 DOA: 06/27/2018 PCP: Glendale Chard, MD   Brief Narrative:  HPI On 06/28/2018 by Dr. Jennette Kettle Lauren Richard is a 82 y.o. female with medical history significant of Pancreatic cancer s/p whipple but unfortunately has mets to liver.  On Xeloda chemo.  Patient presents to the ED with reported c/o abd pain, generalized weakness, N/V, constipation.  EMS noted temp of 104 orally.  Symptoms severe and constant in nature.  Interim history Admitted with sepsis secondary to UTI. Continues to be on antibiotics. Overnight, became unresponsive for a few minutes. CT head obtained and unremarkable.  Assessment & Plan   Sepsis secondary UTI and bacteremia  -Patient presented with fever, tachycardia, tachypnea with leukocytosis -Blood culture: Anaerobic bottlesL shows methicillin resistant staph coag neg (likely contaminant); Aerobic bottles: GPC  -Unable to repeat blood cultures at this time due to restrictive arm band on both arms -UA showed rare bacteria, large leukocytes,, >50WBC -Urine culture >100K GNR- Enterobacter cloacae (resistant to ceftriaxone, zosyn) -Chest x-ray shows no active cardiopulmonary disease -Initially placed on vancomycin, cefepime, Flagyl -Continue IV fluids -lactic acid has normalized -Continue vancomycin; discontinued cefepime and flagyl -Will place on Merrem   Acute altered level of consciousness -resolved, currently AAOx3 -occurred overnight of 9/18-19/2019 -patient was not responsive to voice, movement, or sternal rub -ABG obtained- however WNL -CT head: no acute intracranial abnormality noted  Gas in Right arm noted on CT scan -Orthopedic surgery consulted and appreciated -status post right arm incision and drainage.  Gram stain shows no organisms; culture pending -Patient also had right knee aspiration; Gram stain shows no organisms; culture pending -patient with wound VAC- per ortho: remove  on Sunday and rewrap with Ace wrap only; follow up in the office in 10 days  Hypokalemia -continue to replace and monitor BMP -Magnesium 1.8  Essential hypertension -BP stable, continue metoprolol  Constipation -Continue colace -patient having bowel movements  Elevated troponin -troponin cycled and trending downward -suspect secondary to demand ischemia  -currently chest pain free  Chronic normocytic anemia -baseline hemoglobin around 10, currently 9.3 -was 11 on admission, however patient has been receiving IVF (suspect dilutional component vs patient being hemoconcentrated)  -Continue to monitor CBC  History of PE -Continue Xarelto   Adenocarcinoma of the head of the pancreas -s/p Whipple's procedure March 2019 -Follows with Dr. Burr Medico, oncology -Was on Xeloda (currently held)  IV access -has been an issue, patient does have a port, however tip is not correct positioning and would not be used (under policy) to administer vesicant drugs (such as Vanc) -PCCM was consulted for placement of IJ or central venous access, however patient and family refused -currently has midline  Code discussion -Discussed code status with patient, states she would like to remain full code  DVT Prophylaxis  Xarelto  Code Status: FUll  Family Communication: None at bedside  Disposition Plan: Admitted, If patient remains stable through the day- will transfer to medical floor. Continues to need inpatient admission as cultures are still pending and currently on IV antibiotics.   Consultants PCCM Orthopedics  Procedures  R knee aspiration R arm I&D with wound vac placement  Antibiotics   Anti-infectives (From admission, onward)   Start     Dose/Rate Route Frequency Ordered Stop   06/29/18 2200  cefTRIAXone (ROCEPHIN) 1 g in sodium chloride 0.9 % 100 mL IVPB     1 g 200 mL/hr over 30 Minutes Intravenous Every 24 hours  06/29/18 1346     06/28/18 1800  vancomycin (VANCOCIN) 1,250 mg in  sodium chloride 0.9 % 250 mL IVPB     1,250 mg 166.7 mL/hr over 90 Minutes Intravenous Every 36 hours 06/28/18 0259     06/28/18 0600  ceFEPIme (MAXIPIME) 1 g in sodium chloride 0.9 % 100 mL IVPB  Status:  Discontinued     1 g 200 mL/hr over 30 Minutes Intravenous Every 12 hours 06/28/18 0201 06/29/18 1346   06/27/18 2215  vancomycin (VANCOCIN) IVPB 1000 mg/200 mL premix     1,000 mg 200 mL/hr over 60 Minutes Intravenous  Once 06/27/18 2205 06/28/18 0035   06/27/18 2215  clindamycin (CLEOCIN) IVPB 600 mg     600 mg 100 mL/hr over 30 Minutes Intravenous  Once 06/27/18 2205 06/28/18 1930   06/27/18 1445  ceFEPIme (MAXIPIME) 2 g in sodium chloride 0.9 % 100 mL IVPB     2 g 200 mL/hr over 30 Minutes Intravenous  Once 06/27/18 1430 06/27/18 1606   06/27/18 1445  metroNIDAZOLE (FLAGYL) IVPB 500 mg  Status:  Discontinued     500 mg 100 mL/hr over 60 Minutes Intravenous Every 8 hours 06/27/18 1430 06/29/18 1349      Subjective:   Lauren Richard seen and examined today.  Patient has no complaints today. Denies current chest pain, shortness of breath, abdominal pain, N/V/D/C. States she is tired.  Objective:   Vitals:   06/30/18 0600 06/30/18 0700 06/30/18 0800 06/30/18 0900  BP: (!) 95/45 (!) 99/52 (!) 95/52 (!) 97/49  Pulse: 93 93 95 96  Resp: 20 18 19 19   Temp:   99.1 F (37.3 C)   TempSrc:   Axillary   SpO2: 100% 100% 100% 100%  Weight:      Height:       Exam  General: Well developed, chronically ill appearing, NAD  HEENT: NCAT, mucous membranes moist.   Neck: Supple  Cardiovascular: S1 S2 auscultated, +SEM, RRR  Respiratory: Clear to auscultation bilaterally with equal chest rise  Abdomen: Soft, nontender, nondistended, + bowel sounds  Extremities: warm dry without cyanosis clubbing or edema of LE. RUE with wound vac  Neuro: AAOx3, nonfocal  Psych: Flat however appropriate   Intake/Output Summary (Last 24 hours) at 06/30/2018 1041 Last data filed at 06/30/2018  0600 Gross per 24 hour  Intake 1498.16 ml  Output 700 ml  Net 798.16 ml   Filed Weights   06/27/18 1415 06/30/18 0448  Weight: 68 kg 73.9 kg   Data Reviewed: I have personally reviewed following labs and imaging studies  CBC: Recent Labs  Lab 06/27/18 1435 06/27/18 1439 06/28/18 0159 06/29/18 0557 06/30/18 0500  WBC 15.7*  --  13.3* 15.6* 12.4*  NEUTROABS 12.7*  --   --   --   --   HGB 11.4* 11.9* 12.9 9.7* 9.3*  HCT 35.4* 35.0* 39.9 29.7* 27.8*  MCV 94.9  --  97.6 95.2 94.2  PLT 352  --  270 275 914   Basic Metabolic Panel: Recent Labs  Lab 06/27/18 1435 06/27/18 1439 06/28/18 0159 06/29/18 0557 06/30/18 0500  NA 142 144 147* 145 142  K 2.8* 2.8* 2.9* 3.5 3.2*  CL 111 109 118* 121* 121*  CO2 22  --  20* 16* 17*  GLUCOSE 109* 105* 87 83 72  BUN 9 7* 10 10 10   CREATININE 1.06* 1.00 0.98 1.06* 1.17*  CALCIUM 8.2*  --  8.0* 7.8* 7.5*  MG  --   --  1.8  --   --    GFR: Estimated Creatinine Clearance: 37.3 mL/min (A) (by C-G formula based on SCr of 1.17 mg/dL (H)). Liver Function Tests: Recent Labs  Lab 06/27/18 1435 06/29/18 0557  AST 56* 56*  ALT 24 26  ALKPHOS 261* 222*  BILITOT 0.9 1.0  PROT 7.3 6.2*  ALBUMIN 1.7* 1.5*   Recent Labs  Lab 06/27/18 1435  LIPASE 23   No results for input(s): AMMONIA in the last 168 hours. Coagulation Profile: No results for input(s): INR, PROTIME in the last 168 hours. Cardiac Enzymes: Recent Labs  Lab 06/28/18 0159 06/28/18 0804 06/28/18 1322  TROPONINI 0.12* 0.09* 0.10*   BNP (last 3 results) No results for input(s): PROBNP in the last 8760 hours. HbA1C: No results for input(s): HGBA1C in the last 72 hours. CBG: Recent Labs  Lab 06/28/18 2321  GLUCAP 85   Lipid Profile: No results for input(s): CHOL, HDL, LDLCALC, TRIG, CHOLHDL, LDLDIRECT in the last 72 hours. Thyroid Function Tests: No results for input(s): TSH, T4TOTAL, FREET4, T3FREE, THYROIDAB in the last 72 hours. Anemia Panel: No results  for input(s): VITAMINB12, FOLATE, FERRITIN, TIBC, IRON, RETICCTPCT in the last 72 hours. Urine analysis:    Component Value Date/Time   COLORURINE AMBER (A) 06/27/2018 1950   APPEARANCEUR TURBID (A) 06/27/2018 1950   LABSPEC 1.017 06/27/2018 1950   PHURINE 7.0 06/27/2018 1950   GLUCOSEU NEGATIVE 06/27/2018 1950   HGBUR LARGE (A) 06/27/2018 1950   BILIRUBINUR NEGATIVE 06/27/2018 [place] NEGATIVE 06/27/2018 1950   PROTEINUR 100 (A) 06/27/2018 1950   NITRITE NEGATIVE 06/27/2018 1950   LEUKOCYTESUR LARGE (A) 06/27/2018 1950   Sepsis Labs: @LABRCNTIP (procalcitonin:4,lacticidven:4)  ) Recent Results (from the past 240 hour(s))  Blood Culture (routine x 2)     Status: Abnormal (Preliminary result)   Collection Time: 06/27/18  2:36 PM  Result Value Ref Range Status   Specimen Description   Final    BLOOD RIGHT ANTECUBITAL Performed at [facility], [place] [address] [place], [place] [address]    Special Requests   Final    BOTTLES DRAWN AEROBIC AND ANAEROBIC Blood Culture adequate volume Performed at [facility] [address], [place], [place] [address]    Culture  Setup Time   Final    ANAEROBIC BOTTLE ONLY GRAM POSITIVE COCCI CRITICAL RESULT CALLED TO, READ BACK BY AND VERIFIED WITH: Shelda Jakes [facility] 06/28/18 1939 JDW Performed at [facility], [address], [place], [place] [address]    Culture STAPHYLOCOCCUS SPECIES (COAGULASE NEGATIVE) (A)  Final   Report Status PENDING  Incomplete  Blood Culture ID Panel (Reflexed)     Status: Abnormal   Collection Time: 06/27/18  2:36 PM  Result Value Ref Range Status   Enterococcus species NOT DETECTED NOT DETECTED Final   Listeria monocytogenes NOT DETECTED NOT DETECTED Final   Staphylococcus species DETECTED (A) NOT DETECTED Final    Comment: Methicillin (oxacillin) resistant coagulase negative staphylococcus. Possible blood culture contaminant (unless isolated from more than one  blood culture draw or clinical case suggests pathogenicity). No antibiotic treatment is indicated for blood  culture contaminants. CRITICAL RESULT CALLED TO, READ BACK BY AND VERIFIED WITH: Shelda Jakes [facility] 06/28/18 1939 JDW    Staphylococcus aureus NOT DETECTED NOT DETECTED Final   Methicillin resistance DETECTED (A) NOT DETECTED Final    Comment: CRITICAL RESULT CALLED TO, READ BACK BY AND VERIFIED WITH: Shelda Jakes [facility] 06/28/18 1939 JDW  Streptococcus species NOT DETECTED NOT DETECTED Final   Streptococcus agalactiae NOT DETECTED NOT DETECTED Final   Streptococcus pneumoniae NOT DETECTED NOT DETECTED Final   Streptococcus pyogenes NOT DETECTED NOT DETECTED Final   Acinetobacter baumannii NOT DETECTED NOT DETECTED Final   Enterobacteriaceae species NOT DETECTED NOT DETECTED Final   Enterobacter cloacae complex NOT DETECTED NOT DETECTED Final   Escherichia coli NOT DETECTED NOT DETECTED Final   Klebsiella oxytoca NOT DETECTED NOT DETECTED Final   Klebsiella pneumoniae NOT DETECTED NOT DETECTED Final   Proteus species NOT DETECTED NOT DETECTED Final   Serratia marcescens NOT DETECTED NOT DETECTED Final   Haemophilus influenzae NOT DETECTED NOT DETECTED Final   Neisseria meningitidis NOT DETECTED NOT DETECTED Final   Pseudomonas aeruginosa NOT DETECTED NOT DETECTED Final   Candida albicans NOT DETECTED NOT DETECTED Final   Candida glabrata NOT DETECTED NOT DETECTED Final   Candida krusei NOT DETECTED NOT DETECTED Final   Candida parapsilosis NOT DETECTED NOT DETECTED Final   Candida tropicalis NOT DETECTED NOT DETECTED Final    Comment: Performed at [facility], [place] [address], [place], [place] [address]  Blood Culture (routine x 2)     Status: None (Preliminary result)   Collection Time: 06/27/18  5:52 PM  Result Value Ref Range Status   Specimen Description   Final    BLOOD LEFT ARM Performed at [facility] [address], [place], [place] [address]      Special Requests   Final    BOTTLES DRAWN AEROBIC AND ANAEROBIC Blood Culture adequate volume Performed at [facility] [address], [place], [place] [address]    Culture  Setup Time   Final    GRAM POSITIVE COCCI AEROBIC BOTTLE ONLY CRITICAL VALUE NOTED.  VALUE IS CONSISTENT WITH PREVIOUSLY REPORTED AND CALLED VALUE. CRITICAL RESULT CALLED TO, READ BACK BY AND VERIFIED WITH: JACKSON  PHARMD AT 0930 ON [id number] BY SJW Performed at [facility], [place] [address] [place], [place] [address]    Culture GRAM POSITIVE COCCI  Final   Report Status PENDING  Incomplete  Culture, Urine     Status: Abnormal   Collection Time: 06/27/18  7:50 PM  Result Value Ref Range Status   Specimen Description   Final    URINE, CLEAN CATCH Performed at [facility], [place] [address] [place], [place] [address]    Special Requests   Final    NONE Performed at [facility], [place] [address], [place], [place] [address]    Culture >=100,000 COLONIES/mL ENTEROBACTER CLOACAE (A)  Final   Report Status 06/30/2018 FINAL  Final   Organism ID, Bacteria ENTEROBACTER CLOACAE (A)  Final      Susceptibility   Enterobacter cloacae - MIC*    CEFAZOLIN >=64 RESISTANT Resistant     CEFTRIAXONE >=64 RESISTANT Resistant     CIPROFLOXACIN <=0.25 SENSITIVE Sensitive     GENTAMICIN <=1 SENSITIVE Sensitive     IMIPENEM 0.5 SENSITIVE Sensitive     NITROFURANTOIN 64 INTERMEDIATE Intermediate     TRIMETH/SULFA <=20 SENSITIVE Sensitive     PIP/TAZO >=128 RESISTANT Resistant     * >=100,000 COLONIES/mL ENTEROBACTER CLOACAE  Aerobic/Anaerobic Culture (surgical/deep wound)     Status: None (Preliminary result)   Collection Time: 06/28/18 12:38 AM  Result Value Ref Range Status   Specimen Description   Final    TISSUE RIGHT ARM Performed at [facility] [place]  [place], [place] [address]    Special Requests   Final    NONE Performed at  [facility], [place] [address], [place], [place] [address]    Gram Stain   Final    RARE WBC PRESENT, PREDOMINANTLY MONONUCLEAR NO ORGANISMS SEEN    Culture   Final    NO GROWTH 1 DAY Performed at [facility], [place] [address] [place], [place] [address]    Report Status PENDING  Incomplete  Body fluid culture     Status: None (Preliminary result)   Collection Time: 06/28/18 12:56 AM  Result Value Ref Range Status   Specimen Description   Final    SYNOVIAL RIGHT KNEE Performed at [facility], [place] [address] [place], [place] [address]    Special Requests   Final    NONE Performed at [facility], [place] [address], [place], [place] [address]    Gram Stain   Final    ABUNDANT WBC PRESENT,BOTH PMN AND MONONUCLEAR NO ORGANISMS SEEN    Culture   Final    NO GROWTH 2 DAYS Performed at [facility], [place] [address], [place], [place] [address]    Report Status PENDING  Incomplete  MRSA PCR Screening     Status: None   Collection Time: 06/28/18 12:01 PM  Result Value Ref Range Status   MRSA by PCR NEGATIVE NEGATIVE Final    Comment:        The GeneXpert MRSA Assay (FDA approved for NASAL specimens only), is one component of a comprehensive MRSA colonization surveillance program. It is not intended to diagnose MRSA infection nor to guide or monitor treatment for MRSA infections. Performed at [facility], [place] [address], [place],  [address]       Radiology Studies: Ct Head Wo Contrast  Result Date: 06/29/2018 CLINICAL DATA:  Generalized weakness and confusion EXAM: CT HEAD WITHOUT CONTRAST TECHNIQUE: Contiguous axial images were obtained from the base of the skull through the vertex without intravenous contrast. COMPARISON:  06/21/2018 FINDINGS: Brain: No evidence of acute infarction, hemorrhage, hydrocephalus, extra-axial collection or mass lesion/mass effect. Vascular: No hyperdense vessel or  unexpected calcification. Skull: Normal. Negative for fracture or focal lesion. Sinuses/Orbits: No acute finding. Other: None. IMPRESSION: No acute intracranial abnormality noted. Electronically Signed   By: Inez Catalina M.D.   On: 06/29/2018 10:44     Scheduled Meds: . acetaminophen  500 mg Oral Q12H  . chlorhexidine  15 mL Mouth Rinse BID  . Chlorhexidine Gluconate Cloth  6 each Topical Q0600  . docusate sodium  100 mg Oral BID  . feeding supplement (ENSURE ENLIVE)  237 mL Oral TID BM  . lidocaine  1 patch Transdermal Q12H  . magnesium oxide  400 mg Oral Daily  . mouth rinse  15 mL Mouth Rinse q12n4p  . metoprolol tartrate  12.5 mg Oral BID  . mirtazapine  7.5 mg Oral QHS  . multivitamin  15 mL Oral Daily  . nystatin  5 mL Oral QID  . pantoprazole  40 mg Oral Daily  . potassium chloride SA  40 mEq Oral Daily  . rivaroxaban  20 mg Oral Q supper  . rosuvastatin  20 mg Oral q1800  . thrombin  20,000 Units Topical Once   Continuous Infusions: . sodium chloride Stopped (06/28/18 2353)  . sodium chloride 75 mL/hr at 06/30/18 0459  . cefTRIAXone (ROCEPHIN)  IV Stopped (06/29/18 2213)  . vancomycin  Stopped (06/30/18 0630)     LOS: 2 days   Time Spent in minutes   30 minutes  Dwan Fennel D.O. on 06/30/2018 at 10:41 AM  Between 7am to 7pm - Please see pager noted on [email or web address]  After 7pm go to [email or web address]  And look for the night coverage person covering for me after hours  [facility] Office  [phone]

## 2018-06-30 NOTE — Evaluation (Signed)
Occupational Therapy Evaluation Patient Details Name: Lauren Richard MRN: [id number] DOB: 08-10-1936 Today's Date: 06/30/2018    History of Present Illness 82 yo female admitted with sepsis. S/P I&D R UE-wound VAC placed. Hx of met pancreatic cancer s/p whipple 12/2017, L TKA 04/2017, obesity, CHF   Clinical Impression   Pt was admitted for the above.  She was living with niece and niece assisted her with adls at baseline. She was getting around with RW without assistance. Pt needs extensive assistance with adls/mobility at this time.  Pt is very deconditioned and limited by pain and fatique.  She attempted to help with bed mobility but needed total +2 for rolling to clean up bowel incontinence. Will follow in acute setting with the goals listed below. Pt has a wound vac on L upper arm. She is performing the limited ADLs she participates in with LUE    Follow Up Recommendations  SNF    Equipment Recommendations  (to be further assessed)    Recommendations for Other Services       Precautions / Restrictions Precautions Precautions: Fall Precaution Comments: R UE Wound VAC; bilateral restricted UE bands in place Restrictions Weight Bearing Restrictions: No      Mobility Bed Mobility     Rolling: Total assist;+2 for physical assistance         General bed mobility comments: pt weak:  assisted 5-10%  Transfers                      Balance       Sitting balance - Comments: declined eob                                   ADL either performed or assessed with clinical judgement   ADL Overall ADL's : Needs assistance/impaired Eating/Feeding: Set up;Bed level   Grooming: Set up;Bed level   Upper Body Bathing: Maximal assistance;Bed level   Lower Body Bathing: Total assistance;+2 for physical assistance   Upper Body Dressing : Maximal assistance;Bed level   Lower Body Dressing: Total assistance;+2 for physical assistance;Bed level                  General ADL Comments: pt had diarrhea.  Assisted RN with clean up     Vision         Perception     Praxis      Pertinent Vitals/Pain Pain Assessment: Faces Faces Pain Scale: Hurts whole lot Pain Location: "all over" Pain Descriptors / Indicators: Sore;Guarding;Grimacing Pain Intervention(s): Limited activity within patient's tolerance;Monitored during session;Repositioned;Patient requesting pain meds-RN notified;RN gave pain meds during session     Hand Dominance Right   Extremity/Trunk Assessment Upper Extremity Assessment Upper Extremity Assessment: Generalized weakness(edema L hand; vac R shoulder). Pt is using LUE to self feed, perform grooming.             Communication Communication Communication: No difficulties   Cognition Arousal/Alertness: Awake/alert Behavior During Therapy: WFL for tasks assessed/performed Overall Cognitive Status: No family/caregiver present to determine baseline cognitive functioning                                 General Comments: extra time to initiate   General Comments       Exercises     Shoulder Instructions      Home Living  Family/patient expects to be discharged to:: Unsure Living Arrangements: Alone Available Help at Discharge: Family;Available PRN/intermittently Type of Home: Apartment             Bathroom Shower/Tub: Tub/shower unit         Home Equipment: Environmental consultant - 2 wheels;Wheelchair - [employer];Shower seat          Prior Functioning/Environment    Gait / Transfers Assistance Needed: pt reported that she was previously able to ambulate with RW ADL's / Homemaking Assistance Needed:  assistance with bathing/dressing    Comments: niece lives with pt        OT Problem List: Decreased strength;Decreased activity tolerance;Decreased knowledge of use of DME or AE;Pain;Impaired UE functional use;Increased edema      OT Treatment/Interventions: Self-care/ADL  training;Therapeutic exercise;DME and/or AE instruction;Energy conservation;Patient/family education;Therapeutic activities    OT Goals(Current goals can be found in the care plan section) Acute Rehab OT Goals Patient Stated Goal: decrease pain OT Goal Formulation: With patient Time For Goal Achievement: 07/14/18 Potential to Achieve Goals: Fair ADL Goals Additional ADL Goal #1: pt will roll to bil sides with mod +2 assistance for adls Additional ADL Goal #2: Pt will sit eob x 10 minutes with min guard assist for light strengthening/UB adl task at set up level Additional ADL Goal #3: Pt will tolerate 20 minutes activity with 3 rest breaks  OT Frequency: Min 2X/week   Barriers to D/C:            Co-evaluation              AM-PAC PT "6 Clicks" Daily Activity     Outcome Measure Help from another person eating meals?: A Little Help from another person taking care of personal grooming?: A Little Help from another person toileting, which includes using toliet, bedpan, or urinal?: Total Help from another person bathing (including washing, rinsing, drying)?: A Lot Help from another person to put on and taking off regular upper body clothing?: A Lot Help from another person to put on and taking off regular lower body clothing?: Total 6 Click Score: 12   End of Session    Activity Tolerance: Patient limited by pain;Patient limited by fatigue Patient left: in bed;with call bell/phone within reach;with bed alarm set;with nursing/sitter in room  OT Visit Diagnosis: Muscle weakness (generalized) (M62.81)                Time: 0086-7619 OT Time Calculation (min): 39 min Charges:  OT General Charges $OT Visit: 1 Visit OT Evaluation $OT Eval Moderate Complexity: 1 Mod OT Treatments $Self Care/Home Management : 8-22 mins  Lauren Richard, OTR/L Acute Rehabilitation Services [phone] [facility] pager [phone] office 06/30/2018  Lauren Richard 06/30/2018, 1:39 PM

## 2018-07-01 ENCOUNTER — Inpatient Hospital Stay (HOSPITAL_COMMUNITY): Payer: PPO

## 2018-07-01 DIAGNOSIS — C800 Disseminated malignant neoplasm, unspecified: Secondary | ICD-10-CM

## 2018-07-01 LAB — RENAL FUNCTION PANEL
Albumin: 1.3 g/dL — ABNORMAL LOW (ref 3.5–5.0)
Anion gap: 5 (ref 5–15)
BUN: 8 mg/dL (ref 8–23)
CO2: 16 mmol/L — ABNORMAL LOW (ref 22–32)
Calcium: 7.5 mg/dL — ABNORMAL LOW (ref 8.9–10.3)
Chloride: 126 mmol/L — ABNORMAL HIGH (ref 98–111)
Creatinine, Ser: 1.34 mg/dL — ABNORMAL HIGH (ref 0.44–1.00)
GFR calc Af Amer: 42 mL/min — ABNORMAL LOW (ref >60)
GFR calc non Af Amer: 36 mL/min — ABNORMAL LOW (ref >60)
Glucose, Bld: 91 mg/dL (ref 70–99)
Phosphorus: 2.5 mg/dL (ref 2.5–4.6)
Potassium: 2.9 mmol/L — ABNORMAL LOW (ref 3.5–5.1)
Sodium: 147 mmol/L — ABNORMAL HIGH (ref 135–145)

## 2018-07-01 LAB — CULTURE, BLOOD (ROUTINE X 2): Special Requests: ADEQUATE

## 2018-07-01 LAB — VANCOMYCIN, PEAK: Vancomycin Pk: 40 ug/mL (ref 30–40)

## 2018-07-01 LAB — BODY FLUID CULTURE: Culture: NO GROWTH

## 2018-07-01 LAB — CBC WITH DIFFERENTIAL/PLATELET
Basophils Absolute: 0 10*3/uL (ref 0.0–0.1)
Basophils Relative: 0 %
Eosinophils Absolute: 0.1 10*3/uL (ref 0.0–0.7)
Eosinophils Relative: 1 %
HCT: 26.3 % — ABNORMAL LOW (ref 36.0–46.0)
Hemoglobin: 8.7 g/dL — ABNORMAL LOW (ref 12.0–15.0)
Lymphocytes Relative: 17 %
Lymphs Abs: 1.8 10*3/uL (ref 0.7–4.0)
MCH: 30.9 pg (ref 26.0–34.0)
MCHC: 33.1 g/dL (ref 30.0–36.0)
MCV: 93.3 fL (ref 78.0–100.0)
Monocytes Absolute: 0.9 10*3/uL (ref 0.1–1.0)
Monocytes Relative: 8 %
Neutro Abs: 7.9 10*3/uL — ABNORMAL HIGH (ref 1.7–7.7)
Neutrophils Relative %: 74 %
Platelets: 271 10*3/uL (ref 150–400)
RBC: 2.82 MIL/uL — ABNORMAL LOW (ref 3.87–5.11)
RDW: 15.3 % (ref 11.5–15.5)
WBC: 10.6 10*3/uL — ABNORMAL HIGH (ref 4.0–10.5)

## 2018-07-01 LAB — VANCOMYCIN, TROUGH: Vancomycin Tr: 19 ug/mL (ref 15–20)

## 2018-07-01 LAB — ECHOCARDIOGRAM LIMITED
HEIGHTINCHES: 67 in
WEIGHTICAEL: 2606.72 [oz_av]

## 2018-07-01 LAB — MAGNESIUM: Magnesium: 1.7 mg/dL (ref 1.7–2.4)

## 2018-07-01 NOTE — Progress Notes (Signed)
PROGRESS NOTE    Lauren Richard  DTO:[id number] DOB: 07/08/1936 DOA: 06/27/2018 PCP: Glendale Chard, MD   Brief Narrative:  HPI On 06/28/2018 by Dr. Jennette Kettle Lauren Richard is a 82 y.o. female with medical history significant of Pancreatic cancer s/p whipple but unfortunately has mets to liver.  On Xeloda chemo.  Patient presents to the ED with reported c/o abd pain, generalized weakness, N/V, constipation.  EMS noted temp of 104 orally.  Symptoms severe and constant in nature.  Interim history Admitted with sepsis secondary to unknown source at this time. Continues to be on antibiotics. Overnight, became unresponsive for a few minutes. CT head obtained.  07/01/2018: Patient seen alongside patient's nurse.  Patient is not a particularly good historian.  Available records reviewed.  No documented fever overnight.  Specifically, patient denies fever, chills, cough, shortness of breath.  Work-up is in progress.  Urine culture grew Enterobacter Cloacae, sensitive to ciprofloxacin, imipenem and gentamicin.  Patient is currently on meropenem and vancomycin.  We will continue current antibiotics for now.  Will continue to assess value of vancomycin.  Further management will depend on hospital course.  Assessment & Plan   Sepsis secondary to possible intra-abdominal source vs UTI -Patient presented with fever, tachycardia, tachypnea with leukocytosis.  Urine culture is growing Enterobacter Cloacae. -Blood culture: Anaerobic culture shows methicillin resistant staph coag neg (likely contaminant).  Repeat cultures have not grown any organisms. -Urine culture secondary to Enterobacter cloacae.  Patient is on the right antibiotics.  -Chest x-ray shows no active cardiopulmonary disease -We will be cautious with IV fluids.    Acute altered level of consciousness -resolved -occurred overnight -patient was not responsive to voice, movement, or sternal rub -ABG obtained- however WNL -will obtain CT  head 07/01/2018: Patient is oriented to time, place and person.  Altered level of consciousness has resolved.  Gas in Right arm noted on CT scan -Orthopedic surgery consulted and appreciated -status post right arm incision and drainage.  Gram stain shows no organisms; culture pending -Patient also had right knee aspiration; Gram stain shows no organisms; culture pending -patient with wound VAC- per ortho: remove on Sunday and rewrap with Ace wrap only; follow up in the office in 10 days  Hypokalemia -Repeat renal panel and magnesium ordered. Follow results  Essential hypertension -BP stable, continue metoprolol  Constipation -Continue on colace   Elevated troponin -troponin cycled and trending downward -suspect secondary to demand ischemia  -currently chest pain free  Chronic normocytic anemia -baseline hemoglobin around 10, currently 9.7 -was 11 on admission, however patient has been receiving IVF (suspect dilutional component vs patient being hemoconcentrated)  -Continue to monitor CBC  History of PE -Continue Xarelto   Adenocarcinoma of the head of the pancreas -s/p Whipple's procedure March 2019 -Follows with Dr. Burr Medico, oncology -Was on Xeloda (currently held)  IV access -has been an issue, patient does have a port, however tip is not correct positioning and would not be used (under policy) to administer vesicant drugs (such as Vanc) -PCCM was consulted for placement of IJ or central venous access, however patient and family refused -currently has midline  Code discussion -Discussed code status with patient, states she would like to remain full code  DVT Prophylaxis  Xarelto  Code Status: FUll  Family Communication: None at bedside  Disposition Plan: Admitted, continue to monitor in stepdown down given sepsis and need for frequent monitoring  Consultants PCCM Orthopedics  Procedures  R knee aspiration R arm I&D  with wound vac placement  Antibiotics     Anti-infectives (From admission, onward)   Start     Dose/Rate Route Frequency Ordered Stop   06/30/18 1130  meropenem (MERREM) 1 g in sodium chloride 0.9 % 100 mL IVPB     1 g 200 mL/hr over 30 Minutes Intravenous Every 12 hours 06/30/18 1053     06/29/18 2200  cefTRIAXone (ROCEPHIN) 1 g in sodium chloride 0.9 % 100 mL IVPB  Status:  Discontinued     1 g 200 mL/hr over 30 Minutes Intravenous Every 24 hours 06/29/18 1346 06/30/18 1053   06/28/18 1800  vancomycin (VANCOCIN) 1,250 mg in sodium chloride 0.9 % 250 mL IVPB     1,250 mg 166.7 mL/hr over 90 Minutes Intravenous Every 36 hours 06/28/18 0259     06/28/18 0600  ceFEPIme (MAXIPIME) 1 g in sodium chloride 0.9 % 100 mL IVPB  Status:  Discontinued     1 g 200 mL/hr over 30 Minutes Intravenous Every 12 hours 06/28/18 0201 06/29/18 1346   06/27/18 2215  vancomycin (VANCOCIN) IVPB 1000 mg/200 mL premix     1,000 mg 200 mL/hr over 60 Minutes Intravenous  Once 06/27/18 2205 06/28/18 0035   06/27/18 2215  clindamycin (CLEOCIN) IVPB 600 mg     600 mg 100 mL/hr over 30 Minutes Intravenous  Once 06/27/18 2205 06/28/18 1930   06/27/18 1445  ceFEPIme (MAXIPIME) 2 g in sodium chloride 0.9 % 100 mL IVPB     2 g 200 mL/hr over 30 Minutes Intravenous  Once 06/27/18 1430 06/27/18 1606   06/27/18 1445  metroNIDAZOLE (FLAGYL) IVPB 500 mg  Status:  Discontinued     500 mg 100 mL/hr over 60 Minutes Intravenous Every 8 hours 06/27/18 1430 06/29/18 1349      Subjective:  No fever or chills No shortness of breath No chest pain  Objective:   Vitals:   06/30/18 2034 06/30/18 2101 07/01/18 0427 07/01/18 0800  BP:      Pulse:  99    Resp:      Temp: 98.7 F (37.1 C)  98.8 F (37.1 C) 99.1 F (37.3 C)  TempSrc: Oral  Axillary Oral  SpO2:      Weight:      Height:       Exam  General: Not in any distress.  Patient is awake and alert.    HEENT: Pallor.  No jaundice.  Dry buccal mucosa.     Neck: Supple  Cardiovascular: S1 S2    Respiratory: Clear to auscultation bilaterally, no wheezing   Abdomen: Soft, generalized tenderness.    Extremities: Right upper arm has wound VAC.  Edema of the hand and legs.  Left foot and ankle wrapped.    Neuro: AAOx3, nonfocal  Psych: Normal affect and demeanor   Intake/Output Summary (Last 24 hours) at 07/01/2018 0931 Last data filed at 06/30/2018 1400 Gross per 24 hour  Intake 303.02 ml  Output -  Net 303.02 ml   Filed Weights   06/27/18 1415 06/30/18 0448  Weight: 68 kg 73.9 kg   Data Reviewed: I have personally reviewed following labs and imaging studies  CBC: Recent Labs  Lab 06/27/18 1435 06/27/18 1439 06/28/18 0159 06/29/18 0557 06/30/18 0500  WBC 15.7*  --  13.3* 15.6* 12.4*  NEUTROABS 12.7*  --   --   --   --   HGB 11.4* 11.9* 12.9 9.7* 9.3*  HCT 35.4* 35.0* 39.9 29.7* 27.8*  MCV 94.9  --  97.6 95.2 94.2  PLT 352  --  270 275 478   Basic Metabolic Panel: Recent Labs  Lab 06/27/18 1435 06/27/18 1439 06/28/18 0159 06/29/18 0557 06/30/18 0500  NA 142 144 147* 145 142  K 2.8* 2.8* 2.9* 3.5 3.2*  CL 111 109 118* 121* 121*  CO2 22  --  20* 16* 17*  GLUCOSE 109* 105* 87 83 72  BUN 9 7* 10 10 10   CREATININE 1.06* 1.00 0.98 1.06* 1.17*  CALCIUM 8.2*  --  8.0* 7.8* 7.5*  MG  --   --  1.8  --   --    GFR: Estimated Creatinine Clearance: 37.3 mL/min (A) (by C-G formula based on SCr of 1.17 mg/dL (H)). Liver Function Tests: Recent Labs  Lab 06/27/18 1435 06/29/18 0557  AST 56* 56*  ALT 24 26  ALKPHOS 261* 222*  BILITOT 0.9 1.0  PROT 7.3 6.2*  ALBUMIN 1.7* 1.5*   Recent Labs  Lab 06/27/18 1435  LIPASE 23   No results for input(s): AMMONIA in the last 168 hours. Coagulation Profile: No results for input(s): INR, PROTIME in the last 168 hours. Cardiac Enzymes: Recent Labs  Lab 06/28/18 0159 06/28/18 0804 06/28/18 1322  TROPONINI 0.12* 0.09* 0.10*   BNP (last 3 results) No results for input(s): PROBNP in the last 8760  hours. HbA1C: No results for input(s): HGBA1C in the last 72 hours. CBG: Recent Labs  Lab 06/28/18 2321  GLUCAP 85   Lipid Profile: No results for input(s): CHOL, HDL, LDLCALC, TRIG, CHOLHDL, LDLDIRECT in the last 72 hours. Thyroid Function Tests: No results for input(s): TSH, T4TOTAL, FREET4, T3FREE, THYROIDAB in the last 72 hours. Anemia Panel: No results for input(s): VITAMINB12, FOLATE, FERRITIN, TIBC, IRON, RETICCTPCT in the last 72 hours. Urine analysis:    Component Value Date/Time   COLORURINE AMBER (A) 06/27/2018 1950   APPEARANCEUR TURBID (A) 06/27/2018 1950   LABSPEC 1.017 06/27/2018 1950   PHURINE 7.0 06/27/2018 1950   GLUCOSEU NEGATIVE 06/27/2018 1950   HGBUR LARGE (A) 06/27/2018 1950   BILIRUBINUR NEGATIVE 06/27/2018 [place] NEGATIVE 06/27/2018 1950   PROTEINUR 100 (A) 06/27/2018 1950   NITRITE NEGATIVE 06/27/2018 1950   LEUKOCYTESUR LARGE (A) 06/27/2018 1950   Sepsis Labs: @LABRCNTIP (procalcitonin:4,lacticidven:4)  ) Recent Results (from the past 240 hour(s))  Blood Culture (routine x 2)     Status: Abnormal   Collection Time: 06/27/18  2:36 PM  Result Value Ref Range Status   Specimen Description   Final    BLOOD RIGHT ANTECUBITAL Performed at [facility], [place] [address] [place], [place] [address]    Special Requests   Final    BOTTLES DRAWN AEROBIC AND ANAEROBIC Blood Culture adequate volume Performed at [place] [address], [place], [place] [address]    Culture  Setup Time   Final    ANAEROBIC BOTTLE ONLY GRAM POSITIVE COCCI CRITICAL RESULT CALLED TO, READ BACK BY AND VERIFIED WITH: Shelda Jakes [facility] 06/28/18 1939 JDW Performed at [facility], [address] [place],  [address]    Culture STAPHYLOCOCCUS EPIDERMIDIS (A)  Final   Report Status 07/01/2018 FINAL  Final   Organism ID, Bacteria STAPHYLOCOCCUS EPIDERMIDIS  Final      Susceptibility   Staphylococcus epidermidis -  MIC*    CIPROFLOXACIN >=8 RESISTANT Resistant     ERYTHROMYCIN <=0.25 SENSITIVE Sensitive     GENTAMICIN 8 INTERMEDIATE Intermediate     OXACILLIN >=4 RESISTANT Resistant  TETRACYCLINE 2 SENSITIVE Sensitive     VANCOMYCIN 2 SENSITIVE Sensitive     TRIMETH/SULFA 160 RESISTANT Resistant     CLINDAMYCIN <=0.25 SENSITIVE Sensitive     RIFAMPIN <=0.5 SENSITIVE Sensitive     Inducible Clindamycin NEGATIVE Sensitive     * STAPHYLOCOCCUS EPIDERMIDIS  Blood Culture ID Panel (Reflexed)     Status: Abnormal   Collection Time: 06/27/18  2:36 PM  Result Value Ref Range Status   Enterococcus species NOT DETECTED NOT DETECTED Final   Listeria monocytogenes NOT DETECTED NOT DETECTED Final   Staphylococcus species DETECTED (A) NOT DETECTED Final    Comment: Methicillin (oxacillin) resistant coagulase negative staphylococcus. Possible blood culture contaminant (unless isolated from more than one blood culture draw or clinical case suggests pathogenicity). No antibiotic treatment is indicated for blood  culture contaminants. CRITICAL RESULT CALLED TO, READ BACK BY AND VERIFIED WITH: Shelda Jakes [facility] 06/28/18 1939 JDW    Staphylococcus aureus NOT DETECTED NOT DETECTED Final   Methicillin resistance DETECTED (A) NOT DETECTED Final    Comment: CRITICAL RESULT CALLED TO, READ BACK BY AND VERIFIED WITH: Shelda Jakes [facility] 06/28/18 1939 JDW    Streptococcus species NOT DETECTED NOT DETECTED Final   Streptococcus agalactiae NOT DETECTED NOT DETECTED Final   Streptococcus pneumoniae NOT DETECTED NOT DETECTED Final   Streptococcus pyogenes NOT DETECTED NOT DETECTED Final   Acinetobacter baumannii NOT DETECTED NOT DETECTED Final   Enterobacteriaceae species NOT DETECTED NOT DETECTED Final   Enterobacter cloacae complex NOT DETECTED NOT DETECTED Final   Escherichia coli NOT DETECTED NOT DETECTED Final   Klebsiella oxytoca NOT DETECTED NOT DETECTED Final   Klebsiella pneumoniae NOT DETECTED NOT DETECTED Final    Proteus species NOT DETECTED NOT DETECTED Final   Serratia marcescens NOT DETECTED NOT DETECTED Final   Haemophilus influenzae NOT DETECTED NOT DETECTED Final   Neisseria meningitidis NOT DETECTED NOT DETECTED Final   Pseudomonas aeruginosa NOT DETECTED NOT DETECTED Final   Candida albicans NOT DETECTED NOT DETECTED Final   Candida glabrata NOT DETECTED NOT DETECTED Final   Candida krusei NOT DETECTED NOT DETECTED Final   Candida parapsilosis NOT DETECTED NOT DETECTED Final   Candida tropicalis NOT DETECTED NOT DETECTED Final    Comment: Performed at [facility], [place]. [address], [place], [place] [address]  Blood Culture (routine x 2)     Status: Abnormal   Collection Time: 06/27/18  5:52 PM  Result Value Ref Range Status   Specimen Description   Final    BLOOD LEFT ARM Performed at [facility] [address], [place], [place] [address]    Special Requests   Final    BOTTLES DRAWN AEROBIC AND ANAEROBIC Blood Culture adequate volume Performed at [facility] [address], [place], [place] [address]    Culture  Setup Time   Final    GRAM POSITIVE COCCI AEROBIC BOTTLE ONLY CRITICAL VALUE NOTED.  VALUE IS CONSISTENT WITH PREVIOUSLY REPORTED AND CALLED VALUE. CRITICAL RESULT CALLED TO, READ BACK BY AND VERIFIED WITH: JACKSON  PHARMD AT 0930 ON [id number] BY SJW    Culture (A)  Final    STAPHYLOCOCCUS EPIDERMIDIS SUSCEPTIBILITIES PERFORMED ON PREVIOUS CULTURE WITHIN THE LAST 5 DAYS. Performed at [facility], [place] [address], [place],  [address]    Report Status 07/01/2018 FINAL  Final  Culture, Urine     Status: Abnormal   Collection Time: 06/27/18  7:50 PM  Result Value Ref Range Status  Specimen Description   Final    URINE, CLEAN CATCH Performed at [facility], [place] [address], [place], [place] [address]    Special Requests   Final    NONE Performed at [facility], [place] [address], [place], [place] [address]    Culture >=100,000 COLONIES/mL ENTEROBACTER CLOACAE (A)  Final   Report Status 06/30/2018 FINAL  Final   Organism ID, Bacteria ENTEROBACTER CLOACAE (A)  Final      Susceptibility   Enterobacter cloacae - MIC*    CEFAZOLIN >=64 RESISTANT Resistant     CEFTRIAXONE >=64 RESISTANT Resistant     CIPROFLOXACIN <=0.25 SENSITIVE Sensitive     GENTAMICIN <=1 SENSITIVE Sensitive     IMIPENEM 0.5 SENSITIVE Sensitive     NITROFURANTOIN 64 INTERMEDIATE Intermediate     TRIMETH/SULFA <=20 SENSITIVE Sensitive     PIP/TAZO >=128 RESISTANT Resistant     * >=100,000 COLONIES/mL ENTEROBACTER CLOACAE  Aerobic/Anaerobic Culture (surgical/deep wound)     Status: None (Preliminary result)   Collection Time: 06/28/18 12:38 AM  Result Value Ref Range Status   Specimen Description   Final    TISSUE RIGHT ARM Performed at [facility] [address], [place], [place] [address]    Special Requests   Final    NONE Performed at [facility], [place] [address] [place], [place] [address]    Gram Stain   Final    RARE WBC PRESENT, PREDOMINANTLY MONONUCLEAR NO ORGANISMS SEEN    Culture   Final    NO GROWTH 2 DAYS NO ANAEROBES ISOLATED; CULTURE IN PROGRESS FOR 5 DAYS Performed at [place] [address] [place], [place] [address]    Report Status PENDING  Incomplete  Body fluid culture     Status: None (Preliminary result)   Collection Time: 06/28/18 12:56 AM  Result Value Ref Range Status   Specimen Description   Final    SYNOVIAL RIGHT KNEE Performed at [facility], [place] [address], [place], [place] [address]    Special Requests   Final    NONE Performed at [facility], [place] [address] [place], [place] [address]    Gram Stain   Final    ABUNDANT WBC PRESENT,BOTH PMN AND MONONUCLEAR NO ORGANISMS SEEN    Culture   Final    NO GROWTH 2 DAYS Performed at [facility], [place] [address],  [place], [place] [address]    Report Status PENDING  Incomplete  MRSA PCR Screening     Status: None   Collection Time: 06/28/18 12:01 PM  Result Value Ref Range Status   MRSA by PCR NEGATIVE NEGATIVE Final    Comment:        The GeneXpert MRSA Assay (FDA approved for NASAL specimens only), is one component of a comprehensive MRSA colonization surveillance program. It is not intended to diagnose MRSA infection nor to guide or monitor treatment for MRSA infections. Performed at [facility], [place] [address], [place], [place] [address]       Radiology Studies: Ct Head Wo Contrast  Result Date: 06/29/2018 CLINICAL DATA:  Generalized weakness and confusion EXAM: CT HEAD WITHOUT CONTRAST TECHNIQUE: Contiguous axial images were obtained from the base of the skull through the vertex without intravenous contrast. COMPARISON:  06/21/2018 FINDINGS: Brain: No evidence of acute infarction, hemorrhage, hydrocephalus, extra-axial collection or mass lesion/mass effect. Vascular: No hyperdense vessel or unexpected calcification. Skull: Normal.  Negative for fracture or focal lesion. Sinuses/Orbits: No acute finding. Other: None. IMPRESSION: No acute intracranial abnormality noted. Electronically Signed   By: Inez Catalina M.D.   On: 06/29/2018 10:44     Scheduled Meds: . acetaminophen  500 mg Oral Q12H  . chlorhexidine  15 mL Mouth Rinse BID  . Chlorhexidine Gluconate Cloth  6 each Topical Q0600  . feeding supplement (ENSURE ENLIVE)  237 mL Oral TID BM  . lidocaine  1 patch Transdermal Q12H  . magnesium oxide  400 mg Oral Daily  . mouth rinse  15 mL Mouth Rinse q12n4p  . metoprolol tartrate  12.5 mg Oral BID  . mirtazapine  7.5 mg Oral QHS  . multivitamin  15 mL Oral Daily  . nystatin  5 mL Oral QID  . pantoprazole  40 mg Oral Daily  . potassium chloride SA  40 mEq Oral Daily  . rivaroxaban  20 mg Oral Q supper  . rosuvastatin  20 mg Oral q1800  . thrombin  20,000 Units Topical  Once   Continuous Infusions: . sodium chloride Stopped (06/28/18 2353)  . sodium chloride 75 mL/hr at 06/30/18 1200  . meropenem (MERREM) IV 1 g (06/30/18 2117)  . vancomycin Stopped (06/30/18 0640)     LOS: 3 days   Time Spent in minutes   35 Minutes.   Bonnell Public M.D.. on 07/01/2018 at 9:31 AM  Between 7am to 7pm - Please see pager noted on [email or web address]  After 7pm go to [email or web address]  And look for the night coverage person covering for me after hours  [facility] Office  [phone]

## 2018-07-01 NOTE — Progress Notes (Signed)
  Echocardiogram 2D Echocardiogram has been performed.  Lauren Richard 07/01/2018, 10:10 AM

## 2018-07-02 ENCOUNTER — Inpatient Hospital Stay (HOSPITAL_COMMUNITY): Payer: PPO

## 2018-07-02 DIAGNOSIS — G9341 Metabolic encephalopathy: Secondary | ICD-10-CM

## 2018-07-02 LAB — RENAL FUNCTION PANEL
Albumin: 1.2 g/dL — ABNORMAL LOW (ref 3.5–5.0)
Anion gap: 5 (ref 5–15)
BUN: 8 mg/dL (ref 8–23)
CO2: 15 mmol/L — ABNORMAL LOW (ref 22–32)
Calcium: 7.4 mg/dL — ABNORMAL LOW (ref 8.9–10.3)
Chloride: 121 mmol/L — ABNORMAL HIGH (ref 98–111)
Creatinine, Ser: 1.33 mg/dL — ABNORMAL HIGH (ref 0.44–1.00)
GFR calc Af Amer: 43 mL/min — ABNORMAL LOW (ref >60)
GFR calc non Af Amer: 37 mL/min — ABNORMAL LOW (ref >60)
Glucose, Bld: 80 mg/dL (ref 70–99)
Phosphorus: 2.5 mg/dL (ref 2.5–4.6)
Potassium: 3.6 mmol/L (ref 3.5–5.1)
Sodium: 141 mmol/L (ref 135–145)

## 2018-07-02 LAB — BLOOD GAS, VENOUS
Acid-base deficit: 9.2 mmol/L — ABNORMAL HIGH (ref 0.0–2.0)
Bicarbonate: 14.2 mmol/L — ABNORMAL LOW (ref 20.0–28.0)
O2 Content: 2 L/min
O2 Saturation: 86.4 %
Patient temperature: 98.6
pCO2, Ven: 23.9 mmHg — ABNORMAL LOW (ref 44.0–60.0)
pH, Ven: 7.393 (ref 7.250–7.430)
pO2, Ven: 51.5 mmHg — ABNORMAL HIGH (ref 32.0–45.0)

## 2018-07-02 LAB — GLUCOSE, CAPILLARY
GLUCOSE-CAPILLARY: 131 mg/dL — AB (ref 70–99)
Glucose-Capillary: 147 mg/dL — ABNORMAL HIGH (ref 70–99)

## 2018-07-02 LAB — AMMONIA: Ammonia: 42 umol/L — ABNORMAL HIGH (ref 9–35)

## 2018-07-02 MED ORDER — ALBUMIN HUMAN 25 % IV SOLN
25.0000 g | Freq: Four times a day (QID) | INTRAVENOUS | Status: AC
  Administered 2018-07-02 – 2018-07-04 (×8): 25 g via INTRAVENOUS
  Filled 2018-07-02: qty 50
  Filled 2018-07-02 (×3): qty 100
  Filled 2018-07-02 (×2): qty 50
  Filled 2018-07-02: qty 100
  Filled 2018-07-02: qty 50
  Filled 2018-07-02: qty 100

## 2018-07-02 MED ORDER — SODIUM BICARBONATE 650 MG PO TABS
1300.0000 mg | ORAL_TABLET | Freq: Two times a day (BID) | ORAL | Status: AC
  Administered 2018-07-02 – 2018-07-03 (×2): 1300 mg via ORAL
  Filled 2018-07-02 (×3): qty 2

## 2018-07-02 MED ORDER — POTASSIUM CHLORIDE CRYS ER 20 MEQ PO TBCR
40.0000 meq | EXTENDED_RELEASE_TABLET | Freq: Once | ORAL | Status: DC
  Filled 2018-07-02: qty 2

## 2018-07-02 NOTE — Progress Notes (Signed)
PROGRESS NOTE    Lauren Richard  OMV:[id number] DOB: 1936/04/16 DOA: 06/27/2018 PCP: Glendale Chard, MD   Brief Narrative:  HPI On 06/28/2018 by Dr. Jennette Kettle Lauren Richard is a 82 y.o. female with medical history significant of Pancreatic cancer s/p whipple but unfortunately has mets to liver.  On Xeloda chemo.  Patient presents to the ED with reported c/o abd pain, generalized weakness, N/V, constipation.  EMS noted temp of 104 orally.  Symptoms severe and constant in nature.  Interim history Admitted with sepsis secondary to unknown source at this time. Continues to be on antibiotics. Overnight, became unresponsive for a few minutes. CT head obtained.  07/01/2018: Patient seen alongside patient's nurse.  Patient is not a particularly good historian.  Available records reviewed.  No documented fever overnight.  Specifically, patient denies fever, chills, cough, shortness of breath.  Work-up is in progress.  Urine culture grew Enterobacter Cloacae, sensitive to ciprofloxacin, imipenem and gentamicin.  Patient is currently on meropenem and vancomycin.  We will continue current antibiotics for now.  Will continue to assess value of vancomycin.    07/02/2018: Patient is more sleepy today.  Will check ammonia level.  Will check ABG.  If both are none revealing, we will proceed with CT scan of the head.  We will also check blood sugar every 4 hours.  Continue current antibiotics.  Albumin is noted to be 1.2.  Will start patient on IV albumin 25 g every 6 hourly x8 doses.  Bicarb is 15.  Will start patient on sodium bicarb 1300 mg every 12 x3 days.  Continue to monitor bicarb level.  Guarded prognosis.  Further management will depend on hospital course. Assessment & Plan   Sepsis secondary to possible intra-abdominal source vs UTI -Patient presented with fever, tachycardia, tachypnea with leukocytosis.  Urine culture is growing Enterobacter Cloacae. -Blood culture: Anaerobic culture shows  methicillin resistant staph coag neg (likely contaminant).  Repeat cultures have not grown any organisms. -Urine culture secondary to Enterobacter cloacae.  Patient is on the right antibiotics.  -Chest x-ray shows no active cardiopulmonary disease -Continue current antibiotics regimen (IV vancomycin and meropenem).    Acute altered level of consciousness -resolved -occurred overnight -patient was not responsive to voice, movement, or sternal rub -ABG obtained- however WNL -will obtain CT head 07/02/2018: Patient is more sleepy today.  Kindly see above.  Hopefully, there is no undiagnosed cognitive deficit.  Gas in Right arm noted on CT scan -Orthopedic surgery consulted and appreciated -status post right arm incision and drainage.  Gram stain shows no organisms; culture pending -Patient also had right knee aspiration; Gram stain shows no organisms; culture pending -patient with wound VAC- per ortho: remove on Sunday and rewrap with Ace wrap only; follow up in the office in 10 days  Hypoalbuminemia: See above (IV albumin) May be related to severe malnutrition. May have prognostic significance. Overall, patient's prognosis is guarded.  Hypokalemia -Repeat renal panel and magnesium ordered. 07/02/2018: Potassium level was 3.6.  Will give patient another dose of K-Dur 40 mEq.  Continue to monitor renal panel.  Essential hypertension -BP stable, continue metoprolol  Constipation -Continue on colace   Elevated troponin -troponin cycled and trending downward -suspect secondary to demand ischemia  -currently chest pain free  Chronic normocytic anemia -baseline hemoglobin around 10, currently 9.7 -was 11 on admission, however patient has been receiving IVF (suspect dilutional component vs patient being hemoconcentrated)  -Continue to monitor CBC  History of PE -Continue Xarelto  Adenocarcinoma of the head of the pancreas -s/p Whipple's procedure March 2019 -Follows with Dr.  Burr Medico, oncology -Was on Xeloda (currently held)  IV access -has been an issue, patient does have a port, however tip is not correct positioning and would not be used (under policy) to administer vesicant drugs (such as Vanc) -PCCM was consulted for placement of IJ or central venous access, however patient and family refused -currently has midline  Code discussion -Discussed code status with patient, states she would like to remain full code  DVT Prophylaxis  Xarelto  Code Status: FUll  Family Communication: None at bedside  Disposition Plan: Admitted, continue to monitor in stepdown down given sepsis and need for frequent monitoring  Consultants PCCM Orthopedics  Procedures  R knee aspiration R arm I&D with wound vac placement  Antibiotics   Anti-infectives (From admission, onward)   Start     Dose/Rate Route Frequency Ordered Stop   06/30/18 1130  meropenem (MERREM) 1 g in sodium chloride 0.9 % 100 mL IVPB     1 g 200 mL/hr over 30 Minutes Intravenous Every 12 hours 06/30/18 1053     06/29/18 2200  cefTRIAXone (ROCEPHIN) 1 g in sodium chloride 0.9 % 100 mL IVPB  Status:  Discontinued     1 g 200 mL/hr over 30 Minutes Intravenous Every 24 hours 06/29/18 1346 06/30/18 1053   06/28/18 1800  vancomycin (VANCOCIN) 1,250 mg in sodium chloride 0.9 % 250 mL IVPB     1,250 mg 166.7 mL/hr over 90 Minutes Intravenous Every 36 hours 06/28/18 0259     06/28/18 0600  ceFEPIme (MAXIPIME) 1 g in sodium chloride 0.9 % 100 mL IVPB  Status:  Discontinued     1 g 200 mL/hr over 30 Minutes Intravenous Every 12 hours 06/28/18 0201 06/29/18 1346   06/27/18 2215  vancomycin (VANCOCIN) IVPB 1000 mg/200 mL premix     1,000 mg 200 mL/hr over 60 Minutes Intravenous  Once 06/27/18 2205 06/28/18 0035   06/27/18 2215  clindamycin (CLEOCIN) IVPB 600 mg     600 mg 100 mL/hr over 30 Minutes Intravenous  Once 06/27/18 2205 06/28/18 1930   06/27/18 1445  ceFEPIme (MAXIPIME) 2 g in sodium chloride 0.9 %  100 mL IVPB     2 g 200 mL/hr over 30 Minutes Intravenous  Once 06/27/18 1430 06/27/18 1606   06/27/18 1445  metroNIDAZOLE (FLAGYL) IVPB 500 mg  Status:  Discontinued     500 mg 100 mL/hr over 60 Minutes Intravenous Every 8 hours 06/27/18 1430 06/29/18 1349      Subjective:  No fever or chills No shortness of breath No chest pain  Objective:   Vitals:   07/02/18 0619 07/02/18 0700 07/02/18 0800 07/02/18 0900  BP: (!) 93/51 (!) 93/50 (!) 91/55 (!) 93/48  Pulse: (!) 110 (!) 110 (!) 124 (!) 114  Resp: (!) 24 (!) 27 (!) 30 (!) 28  Temp:   (!) 97.5 F (36.4 C)   TempSrc:   Oral   SpO2: 100% 100% 100% 100%  Weight:      Height:       Exam  General: Not in any distress.  Patient is awake and alert.    HEENT: Pallor.  No jaundice.  Dry buccal mucosa.     Neck: Supple  Cardiovascular: S1 S2   Respiratory: Clear to auscultation bilaterally, no wheezing   Abdomen: Soft, generalized tenderness.    Extremities: Right upper arm has wound VAC.  Edema of  the hand and legs.  Left foot and ankle wrapped.    Neuro: AAOx3, nonfocal  Psych: Normal affect and demeanor   Intake/Output Summary (Last 24 hours) at 07/02/2018 1142 Last data filed at 07/02/2018 0600 Gross per 24 hour  Intake 647.4 ml  Output 900 ml  Net -252.6 ml   Filed Weights   06/27/18 1415 06/30/18 0448  Weight: 68 kg 73.9 kg   Data Reviewed: I have personally reviewed following labs and imaging studies  CBC: Recent Labs  Lab 06/27/18 1435 06/27/18 1439 06/28/18 0159 06/29/18 0557 06/30/18 0500 07/01/18 0750  WBC 15.7*  --  13.3* 15.6* 12.4* 10.6*  NEUTROABS 12.7*  --   --   --   --  7.9*  HGB 11.4* 11.9* 12.9 9.7* 9.3* 8.7*  HCT 35.4* 35.0* 39.9 29.7* 27.8* 26.3*  MCV 94.9  --  97.6 95.2 94.2 93.3  PLT 352  --  270 275 265 423   Basic Metabolic Panel: Recent Labs  Lab 06/28/18 0159 06/29/18 0557 06/30/18 0500 07/01/18 0750 07/02/18 0500  NA 147* 145 142 147* 141  K 2.9* 3.5 3.2* 2.9* 3.6   CL 118* 121* 121* 126* 121*  CO2 20* 16* 17* 16* 15*  GLUCOSE 87 83 72 91 80  BUN 10 10 10 8 8   CREATININE 0.98 1.06* 1.17* 1.34* 1.33*  CALCIUM 8.0* 7.8* 7.5* 7.5* 7.4*  MG 1.8  --   --  1.7  --   PHOS  --   --   --  2.5 2.5   GFR: Estimated Creatinine Clearance: 32.8 mL/min (A) (by C-G formula based on SCr of 1.33 mg/dL (H)). Liver Function Tests: Recent Labs  Lab 06/27/18 1435 06/29/18 0557 07/01/18 0750 07/02/18 0500  AST 56* 56*  --   --   ALT 24 26  --   --   ALKPHOS 261* 222*  --   --   BILITOT 0.9 1.0  --   --   PROT 7.3 6.2*  --   --   ALBUMIN 1.7* 1.5* 1.3* 1.2*   Recent Labs  Lab 06/27/18 1435  LIPASE 23   No results for input(s): AMMONIA in the last 168 hours. Coagulation Profile: No results for input(s): INR, PROTIME in the last 168 hours. Cardiac Enzymes: Recent Labs  Lab 06/28/18 0159 06/28/18 0804 06/28/18 1322  TROPONINI 0.12* 0.09* 0.10*   BNP (last 3 results) No results for input(s): PROBNP in the last 8760 hours. HbA1C: No results for input(s): HGBA1C in the last 72 hours. CBG: Recent Labs  Lab 06/28/18 2321  GLUCAP 85   Lipid Profile: No results for input(s): CHOL, HDL, LDLCALC, TRIG, CHOLHDL, LDLDIRECT in the last 72 hours. Thyroid Function Tests: No results for input(s): TSH, T4TOTAL, FREET4, T3FREE, THYROIDAB in the last 72 hours. Anemia Panel: No results for input(s): VITAMINB12, FOLATE, FERRITIN, TIBC, IRON, RETICCTPCT in the last 72 hours. Urine analysis:    Component Value Date/Time   COLORURINE AMBER (A) 06/27/2018 1950   APPEARANCEUR TURBID (A) 06/27/2018 1950   LABSPEC 1.017 06/27/2018 1950   PHURINE 7.0 06/27/2018 1950   GLUCOSEU NEGATIVE 06/27/2018 1950   HGBUR LARGE (A) 06/27/2018 [place] NEGATIVE 06/27/2018 [place] NEGATIVE 06/27/2018 1950   PROTEINUR 100 (A) 06/27/2018 1950   NITRITE NEGATIVE 06/27/2018 1950   LEUKOCYTESUR LARGE (A) 06/27/2018 1950   Sepsis  Labs: @LABRCNTIP (procalcitonin:4,lacticidven:4)  ) Recent Results (from the past 240 hour(s))  Blood Culture (routine x 2)  Status: Abnormal   Collection Time: 06/27/18  2:36 PM  Result Value Ref Range Status   Specimen Description   Final    BLOOD RIGHT ANTECUBITAL Performed at [facility] [address], [place], [place] [address]    Special Requests   Final    BOTTLES DRAWN AEROBIC AND ANAEROBIC Blood Culture adequate volume Performed at [facility] [address], [place], [place] [address]    Culture  Setup Time   Final    ANAEROBIC BOTTLE ONLY GRAM POSITIVE COCCI CRITICAL RESULT CALLED TO, READ BACK BY AND VERIFIED WITH: Shelda Jakes [facility] 06/28/18 1939 JDW Performed at [facility], [address], [place], [place] [address]    Culture STAPHYLOCOCCUS EPIDERMIDIS (A)  Final   Report Status 07/01/2018 FINAL  Final   Organism ID, Bacteria STAPHYLOCOCCUS EPIDERMIDIS  Final      Susceptibility   Staphylococcus epidermidis - MIC*    CIPROFLOXACIN >=8 RESISTANT Resistant     ERYTHROMYCIN <=0.25 SENSITIVE Sensitive     GENTAMICIN 8 INTERMEDIATE Intermediate     OXACILLIN >=4 RESISTANT Resistant     TETRACYCLINE 2 SENSITIVE Sensitive     VANCOMYCIN 2 SENSITIVE Sensitive     TRIMETH/SULFA 160 RESISTANT Resistant     CLINDAMYCIN <=0.25 SENSITIVE Sensitive     RIFAMPIN <=0.5 SENSITIVE Sensitive     Inducible Clindamycin NEGATIVE Sensitive     * STAPHYLOCOCCUS EPIDERMIDIS  Blood Culture ID Panel (Reflexed)     Status: Abnormal   Collection Time: 06/27/18  2:36 PM  Result Value Ref Range Status   Enterococcus species NOT DETECTED NOT DETECTED Final   Listeria monocytogenes NOT DETECTED NOT DETECTED Final   Staphylococcus species DETECTED (A) NOT DETECTED Final    Comment: Methicillin (oxacillin) resistant coagulase negative staphylococcus. Possible blood culture contaminant (unless isolated from more than one blood culture draw or  clinical case suggests pathogenicity). No antibiotic treatment is indicated for blood  culture contaminants. CRITICAL RESULT CALLED TO, READ BACK BY AND VERIFIED WITH: Shelda Jakes [facility] 06/28/18 1939 JDW    Staphylococcus aureus NOT DETECTED NOT DETECTED Final   Methicillin resistance DETECTED (A) NOT DETECTED Final    Comment: CRITICAL RESULT CALLED TO, READ BACK BY AND VERIFIED WITH: Shelda Jakes [facility] 06/28/18 1939 JDW    Streptococcus species NOT DETECTED NOT DETECTED Final   Streptococcus agalactiae NOT DETECTED NOT DETECTED Final   Streptococcus pneumoniae NOT DETECTED NOT DETECTED Final   Streptococcus pyogenes NOT DETECTED NOT DETECTED Final   Acinetobacter baumannii NOT DETECTED NOT DETECTED Final   Enterobacteriaceae species NOT DETECTED NOT DETECTED Final   Enterobacter cloacae complex NOT DETECTED NOT DETECTED Final   Escherichia coli NOT DETECTED NOT DETECTED Final   Klebsiella oxytoca NOT DETECTED NOT DETECTED Final   Klebsiella pneumoniae NOT DETECTED NOT DETECTED Final   Proteus species NOT DETECTED NOT DETECTED Final   Serratia marcescens NOT DETECTED NOT DETECTED Final   Haemophilus influenzae NOT DETECTED NOT DETECTED Final   Neisseria meningitidis NOT DETECTED NOT DETECTED Final   Pseudomonas aeruginosa NOT DETECTED NOT DETECTED Final   Candida albicans NOT DETECTED NOT DETECTED Final   Candida glabrata NOT DETECTED NOT DETECTED Final   Candida krusei NOT DETECTED NOT DETECTED Final   Candida parapsilosis NOT DETECTED NOT DETECTED Final   Candida tropicalis NOT DETECTED NOT DETECTED Final    Comment: Performed at [facility], [place]. [address] [place],  [address]  Blood Culture (routine x 2)  Status: Abnormal   Collection Time: 06/27/18  5:52 PM  Result Value Ref Range Status   Specimen Description   Final    BLOOD LEFT ARM Performed at [facility] [address] [place], [place] [address]    Special Requests   Final    BOTTLES  DRAWN AEROBIC AND ANAEROBIC Blood Culture adequate volume Performed at [facility] [address], [place], [place] [address]    Culture  Setup Time   Final    GRAM POSITIVE COCCI AEROBIC BOTTLE ONLY CRITICAL VALUE NOTED.  VALUE IS CONSISTENT WITH PREVIOUSLY REPORTED AND CALLED VALUE. CRITICAL RESULT CALLED TO, READ BACK BY AND VERIFIED WITH: JACKSON  PHARMD AT 0930 ON [id number] BY SJW    Culture (A)  Final    STAPHYLOCOCCUS EPIDERMIDIS SUSCEPTIBILITIES PERFORMED ON PREVIOUS CULTURE WITHIN THE LAST 5 DAYS. Performed at [facility], [place] [address], [place], [place] [address]    Report Status 07/01/2018 FINAL  Final  Culture, Urine     Status: Abnormal   Collection Time: 06/27/18  7:50 PM  Result Value Ref Range Status   Specimen Description   Final    URINE, CLEAN CATCH Performed at [facility], [place] [address] [place], [place] [address]    Special Requests   Final    NONE Performed at [facility], [place] [address], [place], [place] [address]    Culture >=100,000 COLONIES/mL ENTEROBACTER CLOACAE (A)  Final   Report Status 06/30/2018 FINAL  Final   Organism ID, Bacteria ENTEROBACTER CLOACAE (A)  Final      Susceptibility   Enterobacter cloacae - MIC*    CEFAZOLIN >=64 RESISTANT Resistant     CEFTRIAXONE >=64 RESISTANT Resistant     CIPROFLOXACIN <=0.25 SENSITIVE Sensitive     GENTAMICIN <=1 SENSITIVE Sensitive     IMIPENEM 0.5 SENSITIVE Sensitive     NITROFURANTOIN 64 INTERMEDIATE Intermediate     TRIMETH/SULFA <=20 SENSITIVE Sensitive     PIP/TAZO >=128 RESISTANT Resistant     * >=100,000 COLONIES/mL ENTEROBACTER CLOACAE  Aerobic/Anaerobic Culture (surgical/deep wound)     Status: None (Preliminary result)   Collection Time: 06/28/18 12:38 AM  Result Value Ref Range Status   Specimen Description   Final    TISSUE RIGHT ARM Performed at [facility] [address] [place], [place]  [address]    Special Requests   Final    NONE Performed at [facility], [place] [address] [place], [place] [address]    Gram Stain   Final    RARE WBC PRESENT, PREDOMINANTLY MONONUCLEAR NO ORGANISMS SEEN    Culture   Final    NO GROWTH 3 DAYS NO ANAEROBES ISOLATED; CULTURE IN PROGRESS FOR 5 DAYS Performed at [facility] [address], [place], [place] [address]    Report Status PENDING  Incomplete  Body fluid culture     Status: None   Collection Time: 06/28/18 12:56 AM  Result Value Ref Range Status   Specimen Description   Final    SYNOVIAL RIGHT KNEE Performed at [facility], [place] [address], [place],  [address]    Special Requests   Final    NONE Performed at [facility], [place] [address] [place], [place] [address]    Gram Stain   Final    ABUNDANT WBC PRESENT,BOTH PMN AND MONONUCLEAR NO ORGANISMS SEEN    Culture   Final  NO GROWTH 3 DAYS Performed at [facility], [place] [address], [place], [place] [address]    Report Status 07/01/2018 FINAL  Final  MRSA PCR Screening     Status: None   Collection Time: 06/28/18 12:01 PM  Result Value Ref Range Status   MRSA by PCR NEGATIVE NEGATIVE Final    Comment:        The GeneXpert MRSA Assay (FDA approved for NASAL specimens only), is one component of a comprehensive MRSA colonization surveillance program. It is not intended to diagnose MRSA infection nor to guide or monitor treatment for MRSA infections. Performed at [facility], [place] [address], [place], [place] [address]       Radiology Studies: No results found.   Scheduled Meds: . acetaminophen  500 mg Oral Q12H  . chlorhexidine  15 mL Mouth Rinse BID  . Chlorhexidine Gluconate Cloth  6 each Topical Q0600  . feeding supplement (ENSURE ENLIVE)  237 mL Oral TID BM  . lidocaine  1 patch Transdermal Q12H  . magnesium oxide  400 mg Oral Daily  . mouth rinse  15 mL Mouth Rinse  q12n4p  . metoprolol tartrate  12.5 mg Oral BID  . mirtazapine  7.5 mg Oral QHS  . multivitamin  15 mL Oral Daily  . nystatin  5 mL Oral QID  . pantoprazole  40 mg Oral Daily  . potassium chloride SA  40 mEq Oral Daily  . potassium chloride  40 mEq Oral Once  . rivaroxaban  20 mg Oral Q supper  . rosuvastatin  20 mg Oral q1800  . sodium bicarbonate  1,300 mg Oral BID  . thrombin  20,000 Units Topical Once   Continuous Infusions: . sodium chloride Stopped (06/28/18 2353)  . sodium chloride 75 mL/hr at 06/30/18 1200  . albumin human    . meropenem (MERREM) IV Stopped (07/01/18 2311)  . vancomycin Stopped (07/01/18 1929)     LOS: 4 days   Time Spent in minutes   35 Minutes.   Bonnell Public M.D.. on 07/02/2018 at 11:42 AM  Between 7am to 7pm - Please see pager noted on [email or web address]  After 7pm go to [email or web address]  And look for the night coverage person covering for me after hours  [facility] Office  [phone]

## 2018-07-03 LAB — AEROBIC/ANAEROBIC CULTURE (SURGICAL/DEEP WOUND)

## 2018-07-03 LAB — RENAL FUNCTION PANEL
Albumin: 2.4 g/dL — ABNORMAL LOW (ref 3.5–5.0)
Anion gap: 6 (ref 5–15)
BUN: 10 mg/dL (ref 8–23)
CO2: 16 mmol/L — ABNORMAL LOW (ref 22–32)
Calcium: 7.7 mg/dL — ABNORMAL LOW (ref 8.9–10.3)
Chloride: 119 mmol/L — ABNORMAL HIGH (ref 98–111)
Creatinine, Ser: 1.54 mg/dL — ABNORMAL HIGH (ref 0.44–1.00)
GFR calc Af Amer: 36 mL/min — ABNORMAL LOW (ref >60)
GFR calc non Af Amer: 31 mL/min — ABNORMAL LOW (ref >60)
Glucose, Bld: 103 mg/dL — ABNORMAL HIGH (ref 70–99)
Phosphorus: 2.5 mg/dL (ref 2.5–4.6)
Potassium: 4 mmol/L (ref 3.5–5.1)
Sodium: 141 mmol/L (ref 135–145)

## 2018-07-03 LAB — GLUCOSE, CAPILLARY
GLUCOSE-CAPILLARY: 85 mg/dL (ref 70–99)
GLUCOSE-CAPILLARY: 82 mg/dL (ref 70–99)
Glucose-Capillary: 103 mg/dL — ABNORMAL HIGH (ref 70–99)
Glucose-Capillary: 100 mg/dL — ABNORMAL HIGH (ref 70–99)
Glucose-Capillary: 94 mg/dL (ref 70–99)
Glucose-Capillary: 72 mg/dL (ref 70–99)

## 2018-07-03 LAB — CBC WITH DIFFERENTIAL/PLATELET
Basophils Absolute: 0 10*3/uL (ref 0.0–0.1)
Basophils Relative: 0 %
Eosinophils Absolute: 0.1 10*3/uL (ref 0.0–0.7)
Eosinophils Relative: 1 %
HCT: 22.2 % — ABNORMAL LOW (ref 36.0–46.0)
Hemoglobin: 7.5 g/dL — ABNORMAL LOW (ref 12.0–15.0)
Lymphocytes Relative: 14 %
Lymphs Abs: 1.8 10*3/uL (ref 0.7–4.0)
MCH: 31.1 pg (ref 26.0–34.0)
MCHC: 33.8 g/dL (ref 30.0–36.0)
MCV: 92.1 fL (ref 78.0–100.0)
Monocytes Absolute: 1.2 10*3/uL — ABNORMAL HIGH (ref 0.1–1.0)
Monocytes Relative: 9 %
Neutro Abs: 9.7 10*3/uL — ABNORMAL HIGH (ref 1.7–7.7)
Neutrophils Relative %: 76 %
Platelets: 244 10*3/uL (ref 150–400)
RBC: 2.41 MIL/uL — ABNORMAL LOW (ref 3.87–5.11)
RDW: 16.2 % — ABNORMAL HIGH (ref 11.5–15.5)
WBC: 12.8 10*3/uL — ABNORMAL HIGH (ref 4.0–10.5)

## 2018-07-03 LAB — VANCOMYCIN, TROUGH: Vancomycin Tr: 23 ug/mL (ref 15–20)

## 2018-07-03 LAB — AEROBIC/ANAEROBIC CULTURE W GRAM STAIN (SURGICAL/DEEP WOUND): Culture: NO GROWTH

## 2018-07-03 LAB — MAGNESIUM: Magnesium: 1.9 mg/dL (ref 1.7–2.4)

## 2018-07-03 MED ORDER — VANCOMYCIN HCL IN DEXTROSE 1-5 GM/200ML-% IV SOLN
1000.0000 mg | INTRAVENOUS | Status: DC
  Filled 2018-07-03: qty 200

## 2018-07-03 NOTE — Progress Notes (Signed)
Physical Therapy Treatment Patient Details Name: Lauren Richard MRN: [id number] DOB: 16-Jun-1936 Today's Date: 07/03/2018    History of Present Illness 82 yo female admitted with sepsis. S/P I&D R UE-wound VAC placed. Hx of met pancreatic cancer s/p whipple 12/2017, L TKA 04/2017, obesity, CHF    PT Comments    Pt seen in ICU, in bed on 1 lt nasal at 100%.  BP 129/63, HR 95 and RR 21.  Pt c/o pain everywhere.  I carefully removed the blankets, pt grimaced in pain.  I carefully removed B LE pumps, pt grimaced in pain.  Pt required + 2 Total Assist to transfer to EOB.  Pt required Max Assist to sit EOB due to severe posterior lean Vitals EOB BP 145/68, HR 102, RR 21, RA 99%.  Attempted sit to stand with + 2 assist and a walker however pt too weak to clear hips off bed.  So assisted to recliner via "bear hug" 1/4 pivot.  Rec nursing use Max Lift to assist back to bed.      Follow Up Recommendations  SNF     Equipment Recommendations  None recommended by PT    Recommendations for Other Services       Precautions / Restrictions Precautions Precautions: Fall Precaution Comments: R UE Wound VAC; bilateral restricted UE bands in place. Flexi Seal Restrictions Weight Bearing Restrictions: No RLE Weight Bearing: Weight bearing as tolerated LLE Weight Bearing: Weight bearing as tolerated Other Position/Activity Restrictions: pt is just too weak to fully weightbear B LE    Mobility  Bed Mobility Overal bed mobility: Needs Assistance Bed Mobility: Supine to Sit     Supine to sit: Total assist;+2 for physical assistance;+2 for safety/equipment(pt 5%)     General bed mobility comments: pt barely 5% with very little self effort/ability   VERY weak and swelling throughout  Transfers Overall transfer level: Needs assistance   Transfers: Stand Pivot Transfers           General transfer comment: attempted sit to stand + 2 assist and walker however pt was unable to clear hips from bed.   VERY WEAK.  So "Bear Hug" 1/4 pivot from elevated bed to recliner towards pt's LEFT.  Rec nursing use sky lIFT TO ASSIST BACK TO BED.   Ambulation/Gait             General Gait Details: unable to attempt due to poor transfer ability.     Stairs             Wheelchair Mobility    Modified Rankin (Stroke Patients Only)       Balance                                            Cognition Arousal/Alertness: Awake/alert                                     General Comments: requires extra time and care with step by step instruction to minimize pain      Exercises      General Comments        Pertinent Vitals/Pain Pain Location: "all over" with every motion every movement  Pain Descriptors / Indicators: Grimacing Pain Intervention(s): Monitored during session;Repositioned    Home Living  Prior Function            PT Goals (current goals can now be found in the care plan section) Progress towards PT goals: Progressing toward goals    Frequency    Min 2X/week      PT Plan Current plan remains appropriate    Co-evaluation              AM-PAC PT "6 Clicks" Daily Activity  Outcome Measure  Difficulty turning over in bed (including adjusting bedclothes, sheets and blankets)?: Unable Difficulty moving from lying on back to sitting on the side of the bed? : Unable Difficulty sitting down on and standing up from a chair with arms (e.g., wheelchair, bedside commode, etc,.)?: Unable Help needed moving to and from a bed to chair (including a wheelchair)?: Total Help needed walking in hospital room?: Total Help needed climbing 3-5 steps with a railing? : Total 6 Click Score: 6    End of Session Equipment Utilized During Treatment: Gait belt Activity Tolerance: Patient limited by fatigue;Other (comment)(severe weakness) Patient left: in chair;with call bell/phone within reach Nurse  Communication: Mobility status;Need for lift equipment PT Visit Diagnosis: Muscle weakness (generalized) (M62.81);Difficulty in walking, not elsewhere classified (R26.2);Other abnormalities of gait and mobility (R26.89)     Time: 4650-3546 PT Time Calculation (min) (ACUTE ONLY): 27 min  Charges:  $Therapeutic Activity: 23-37 mins                     Rica Koyanagi  PTA Acute  Rehabilitation Services Pager      [phone] Office      [phone]

## 2018-07-03 NOTE — Care Management Note (Signed)
Case Management Note  Patient Details  Name: Lauren Richard MRN: [id number] Date of Birth: 06/27/1936  Subjective/Objective:                  Weakness, fever, hx of pancreatic ca with mets to the liver, ammonia level 42,confused slightly,wbc-12.3  Action/Plan: Following for progression of care. Following for cm needs, none present at this time, no discharge plans at this time.  Expected Discharge Date:  (unknown)               Expected Discharge Plan:  Skilled Nursing Facility  In-House Referral:  Clinical Social Work  Discharge planning Services  CM Consult  Post Acute Care Choice:    Choice offered to:     DME Arranged:    DME Agency:     HH Arranged:    [place] Agency:     Status of Service:  In process, will continue to follow  If discussed at Long Length of Stay Meetings, dates discussed:    Additional Comments:  Leeroy Cha, RN 07/03/2018, 9:14 AM

## 2018-07-03 NOTE — Progress Notes (Signed)
Patient ID: KRISTYANNA BARCELO, female   DOB: [birth year], 82 y.o.   MRN: [id number]  Examined pt at request of attending. VAC dressing intact with mild amount of fluid trapped under drape medial to sponge. Spoke with RN in room, ok'd removal of VAC with application of dry dressing and ACE. F/u with Dr. Marlou Sa as directed.    Lisette Abu, PA-C Orthopedic Surgery [phone]

## 2018-07-03 NOTE — Progress Notes (Signed)
Patient stable Arm dressing intact No evidence of ongoing infection All cultures and pathology negative Follow-up in 10 days for suture removal

## 2018-07-03 NOTE — Progress Notes (Signed)
PROGRESS NOTE    Lauren Richard  MWN:[id number] DOB: 05/24/1936 DOA: 06/27/2018 PCP: Glendale Chard, MD   Brief Narrative:  HPI On 06/28/2018 by Dr. Jennette Kettle Lauren Richard is a 82 y.o. female with medical history significant of Pancreatic cancer s/p whipple on chemo with mets to liver, presents to the ED with c/o abd pain, generalized weakness, N/V, constipation. EMS noted temp of 104 orally. Admitted with sepsis and further management   Assessment & Plan   Sepsis secondary to possible intra-abdominal source vs UTI Currently afebrile with leukocytosis Patient presented with fever, tachycardia, tachypnea with leukocytosis Urine culture is growing Enterobacter Cloacae Blood culture: Anaerobic culture shows methicillin resistant staph coag neg (likely contaminant). Repeat BC cultures pending collection Chest x-ray shows no active cardiopulmonary disease Continue current antibiotics regimen (IV vancomycin and meropenem).    Acute metabolic encephalopathy Improved ABG WNL CT head unremarkable Management as above  AKI with metabolic acidosis Worsening Cr, likely due to supratherapeutic Vanc levels Adjust Vanc level per pharmacy Strict I&O, bladder scan, avoid nephrotics Continue IVF, bicarb tabs  Cellulitis/gas in Right arm noted on CT scan Orthopedic surgery consulted and appreciated Status post right arm incision and drainage.  Gram stain shows no organisms, culture no growth Patient also had right knee aspiration; Gram stain shows no organisms, culture no growth S/P removal wound VAC per ortho on 07/03/18, placed ACE wrap. Follow up in the office in 10 days  Hypoalbuminemia May be related to severe malnutrition. Was started on IV Albumin  Overall, patient's prognosis is guarded  Essential hypertension BP stable, continue metoprolol  Elevated troponin Chest pain free Troponin cycled and trending downward Suspect secondary to demand ischemia   Chronic normocytic  anemia Baseline hemoglobin around 10, currently 7.5 Suspect dilutional component, no signs of bleeding noted Type and screen Daily CBC  History of PE Continue Xarelto   Adenocarcinoma of the head of the pancreas s/p Whipple's procedure March 2019 Follows with Dr. Burr Medico, oncology Was on Xeloda (currently held)  IV access Patient does have a port, however tip is not correct positioning and would not be used (under policy) to administer vesicant drugs (such as Vanc) PCCM was consulted for placement of IJ or central venous access, however patient and family refused Currently has midline  Code discussion/poor prognosis Discussed code status with patient, states she would like to remain full code Palliative care consulted  DVT Prophylaxis  Xarelto  Code Status: FULL  Family Communication: None at bedside  Disposition Plan: SNF  Consultants PCCM Orthopedics  Procedures  R knee aspiration R arm I&D with wound vac placement  Antibiotics   Anti-infectives (From admission, onward)   Start     Dose/Rate Route Frequency Ordered Stop   07/05/18 0630  vancomycin (VANCOCIN) IVPB 1000 mg/200 mL premix     1,000 mg 200 mL/hr over 60 Minutes Intravenous Every 48 hours 07/03/18 0941     06/30/18 1130  meropenem (MERREM) 1 g in sodium chloride 0.9 % 100 mL IVPB     1 g 200 mL/hr over 30 Minutes Intravenous Every 12 hours 06/30/18 1053     06/29/18 2200  cefTRIAXone (ROCEPHIN) 1 g in sodium chloride 0.9 % 100 mL IVPB  Status:  Discontinued     1 g 200 mL/hr over 30 Minutes Intravenous Every 24 hours 06/29/18 1346 06/30/18 1053   06/28/18 1800  vancomycin (VANCOCIN) 1,250 mg in sodium chloride 0.9 % 250 mL IVPB  Status:  Discontinued  1,250 mg 166.7 mL/hr over 90 Minutes Intravenous Every 36 hours 06/28/18 0259 07/03/18 0941   06/28/18 0600  ceFEPIme (MAXIPIME) 1 g in sodium chloride 0.9 % 100 mL IVPB  Status:  Discontinued     1 g 200 mL/hr over 30 Minutes Intravenous Every 12  hours 06/28/18 0201 06/29/18 1346   06/27/18 2215  vancomycin (VANCOCIN) IVPB 1000 mg/200 mL premix     1,000 mg 200 mL/hr over 60 Minutes Intravenous  Once 06/27/18 2205 06/28/18 0035   06/27/18 2215  clindamycin (CLEOCIN) IVPB 600 mg     600 mg 100 mL/hr over 30 Minutes Intravenous  Once 06/27/18 2205 06/28/18 1930   06/27/18 1445  ceFEPIme (MAXIPIME) 2 g in sodium chloride 0.9 % 100 mL IVPB     2 g 200 mL/hr over 30 Minutes Intravenous  Once 06/27/18 1430 06/27/18 1606   06/27/18 1445  metroNIDAZOLE (FLAGYL) IVPB 500 mg  Status:  Discontinued     500 mg 100 mL/hr over 60 Minutes Intravenous Every 8 hours 06/27/18 1430 06/29/18 1349      Subjective:  Patient denies any abdominal pain, SOB, chest pain, N/V/D, fever/chills  Objective:   Vitals:   07/03/18 0600 07/03/18 0800 07/03/18 1200 07/03/18 1255  BP: (!) 113/59 (!) 113/57 (!) 135/47 (!) 120/36  Pulse: 83 89 93 87  Resp: 18 20 19 20   Temp:  97.7 F (36.5 C) 97.9 F (36.6 C) (!) 97.4 F (36.3 C)  TempSrc:  Axillary  Oral  SpO2: 100% 100% 100% 100%  Weight:      Height:       Exam  General: NAD, awake, alert, chronically ill  HEENT: Pallor    Neck: Supple  Cardiovascular: S1 S2   Respiratory: Clear to auscultation bilaterally, no wheezing   Abdomen: Soft, NT, ND, BS present   Extremities: Right upper arm with ACE wrap, edema of the hand and foot.  Left foot and ankle wrapped.    Neuro: AAOx3, nonfocal  Psych: Normal mood   Intake/Output Summary (Last 24 hours) at 07/03/2018 1419 Last data filed at 07/03/2018 2505 Gross per 24 hour  Intake 2257.85 ml  Output 800 ml  Net 1457.85 ml   Filed Weights   06/27/18 1415 06/30/18 0448  Weight: 68 kg 73.9 kg   Data Reviewed: I have personally reviewed following labs and imaging studies  CBC: Recent Labs  Lab 06/27/18 1435  06/28/18 0159 06/29/18 0557 06/30/18 0500 07/01/18 0750 07/03/18 0531  WBC 15.7*  --  13.3* 15.6* 12.4* 10.6* 12.8*  NEUTROABS  12.7*  --   --   --   --  7.9* 9.7*  HGB 11.4*   < > 12.9 9.7* 9.3* 8.7* 7.5*  HCT 35.4*   < > 39.9 29.7* 27.8* 26.3* 22.2*  MCV 94.9  --  97.6 95.2 94.2 93.3 92.1  PLT 352  --  270 275 265 271 244   < > = values in this interval not displayed.   Basic Metabolic Panel: Recent Labs  Lab 06/28/18 0159 06/29/18 0557 06/30/18 0500 07/01/18 0750 07/02/18 0500 07/03/18 0531  NA 147* 145 142 147* 141 141  K 2.9* 3.5 3.2* 2.9* 3.6 4.0  CL 118* 121* 121* 126* 121* 119*  CO2 20* 16* 17* 16* 15* 16*  GLUCOSE 87 83 72 91 80 103*  BUN 10 10 10 8 8 10   CREATININE 0.98 1.06* 1.17* 1.34* 1.33* 1.54*  CALCIUM 8.0* 7.8* 7.5* 7.5* 7.4* 7.7*  MG 1.8  --   --  1.7  --  1.9  PHOS  --   --   --  2.5 2.5 2.5   GFR: Estimated Creatinine Clearance: 28.3 mL/min (A) (by C-G formula based on SCr of 1.54 mg/dL (H)). Liver Function Tests: Recent Labs  Lab 06/27/18 1435 06/29/18 0557 07/01/18 0750 07/02/18 0500 07/03/18 0531  AST 56* 56*  --   --   --   ALT 24 26  --   --   --   ALKPHOS 261* 222*  --   --   --   BILITOT 0.9 1.0  --   --   --   PROT 7.3 6.2*  --   --   --   ALBUMIN 1.7* 1.5* 1.3* 1.2* 2.4*   Recent Labs  Lab 06/27/18 1435  LIPASE 23   Recent Labs  Lab 07/02/18 1134  AMMONIA 42*   Coagulation Profile: No results for input(s): INR, PROTIME in the last 168 hours. Cardiac Enzymes: Recent Labs  Lab 06/28/18 0159 06/28/18 0804 06/28/18 1322  TROPONINI 0.12* 0.09* 0.10*   BNP (last 3 results) No results for input(s): PROBNP in the last 8760 hours. HbA1C: No results for input(s): HGBA1C in the last 72 hours. CBG: Recent Labs  Lab 07/02/18 1941 07/02/18 2311 07/03/18 0352 07/03/18 0754 07/03/18 1201  GLUCAP 147* 131* 103* 85 100*   Lipid Profile: No results for input(s): CHOL, HDL, LDLCALC, TRIG, CHOLHDL, LDLDIRECT in the last 72 hours. Thyroid Function Tests: No results for input(s): TSH, T4TOTAL, FREET4, T3FREE, THYROIDAB in the last 72 hours. Anemia  Panel: No results for input(s): VITAMINB12, FOLATE, FERRITIN, TIBC, IRON, RETICCTPCT in the last 72 hours. Urine analysis:    Component Value Date/Time   COLORURINE AMBER (A) 06/27/2018 1950   APPEARANCEUR TURBID (A) 06/27/2018 1950   LABSPEC 1.017 06/27/2018 1950   PHURINE 7.0 06/27/2018 1950   GLUCOSEU NEGATIVE 06/27/2018 1950   HGBUR LARGE (A) 06/27/2018 1950   BILIRUBINUR NEGATIVE 06/27/2018 [place] NEGATIVE 06/27/2018 1950   PROTEINUR 100 (A) 06/27/2018 1950   NITRITE NEGATIVE 06/27/2018 1950   LEUKOCYTESUR LARGE (A) 06/27/2018 1950   Sepsis Labs: @LABRCNTIP (procalcitonin:4,lacticidven:4)  ) Recent Results (from the past 240 hour(s))  Blood Culture (routine x 2)     Status: Abnormal   Collection Time: 06/27/18  2:36 PM  Result Value Ref Range Status   Specimen Description   Final    BLOOD RIGHT ANTECUBITAL Performed at [facility], [place] [address], [place], [place] [address]    Special Requests   Final    BOTTLES DRAWN AEROBIC AND ANAEROBIC Blood Culture adequate volume Performed at [place] [address], [place], [place] [address]    Culture  Setup Time   Final    ANAEROBIC BOTTLE ONLY GRAM POSITIVE COCCI CRITICAL RESULT CALLED TO, READ BACK BY AND VERIFIED WITH: Shelda Jakes [facility] 06/28/18 1939 JDW Performed at [facility], [address] [place], [place] [address]    Culture STAPHYLOCOCCUS EPIDERMIDIS (A)  Final   Report Status 07/01/2018 FINAL  Final   Organism ID, Bacteria STAPHYLOCOCCUS EPIDERMIDIS  Final      Susceptibility   Staphylococcus epidermidis - MIC*    CIPROFLOXACIN >=8 RESISTANT Resistant     ERYTHROMYCIN <=0.25 SENSITIVE Sensitive     GENTAMICIN 8 INTERMEDIATE Intermediate     OXACILLIN >=4 RESISTANT Resistant     TETRACYCLINE 2 SENSITIVE Sensitive     VANCOMYCIN 2 SENSITIVE Sensitive     TRIMETH/SULFA  160 RESISTANT Resistant     CLINDAMYCIN <=0.25 SENSITIVE Sensitive     RIFAMPIN  <=0.5 SENSITIVE Sensitive     Inducible Clindamycin NEGATIVE Sensitive     * STAPHYLOCOCCUS EPIDERMIDIS  Blood Culture ID Panel (Reflexed)     Status: Abnormal   Collection Time: 06/27/18  2:36 PM  Result Value Ref Range Status   Enterococcus species NOT DETECTED NOT DETECTED Final   Listeria monocytogenes NOT DETECTED NOT DETECTED Final   Staphylococcus species DETECTED (A) NOT DETECTED Final    Comment: Methicillin (oxacillin) resistant coagulase negative staphylococcus. Possible blood culture contaminant (unless isolated from more than one blood culture draw or clinical case suggests pathogenicity). No antibiotic treatment is indicated for blood  culture contaminants. CRITICAL RESULT CALLED TO, READ BACK BY AND VERIFIED WITH: Shelda Jakes [facility] 06/28/18 1939 JDW    Staphylococcus aureus NOT DETECTED NOT DETECTED Final   Methicillin resistance DETECTED (A) NOT DETECTED Final    Comment: CRITICAL RESULT CALLED TO, READ BACK BY AND VERIFIED WITH: Shelda Jakes [facility] 06/28/18 1939 JDW    Streptococcus species NOT DETECTED NOT DETECTED Final   Streptococcus agalactiae NOT DETECTED NOT DETECTED Final   Streptococcus pneumoniae NOT DETECTED NOT DETECTED Final   Streptococcus pyogenes NOT DETECTED NOT DETECTED Final   Acinetobacter baumannii NOT DETECTED NOT DETECTED Final   Enterobacteriaceae species NOT DETECTED NOT DETECTED Final   Enterobacter cloacae complex NOT DETECTED NOT DETECTED Final   Escherichia coli NOT DETECTED NOT DETECTED Final   Klebsiella oxytoca NOT DETECTED NOT DETECTED Final   Klebsiella pneumoniae NOT DETECTED NOT DETECTED Final   Proteus species NOT DETECTED NOT DETECTED Final   Serratia marcescens NOT DETECTED NOT DETECTED Final   Haemophilus influenzae NOT DETECTED NOT DETECTED Final   Neisseria meningitidis NOT DETECTED NOT DETECTED Final   Pseudomonas aeruginosa NOT DETECTED NOT DETECTED Final   Candida albicans NOT DETECTED NOT DETECTED Final   Candida glabrata NOT  DETECTED NOT DETECTED Final   Candida krusei NOT DETECTED NOT DETECTED Final   Candida parapsilosis NOT DETECTED NOT DETECTED Final   Candida tropicalis NOT DETECTED NOT DETECTED Final    Comment: Performed at [facility], [place] [address], [place], [place] [address]  Blood Culture (routine x 2)     Status: Abnormal   Collection Time: 06/27/18  5:52 PM  Result Value Ref Range Status   Specimen Description   Final    BLOOD LEFT ARM Performed at [place] [address] [place], [place] [address]    Special Requests   Final    BOTTLES DRAWN AEROBIC AND ANAEROBIC Blood Culture adequate volume Performed at [place] [address], [place], [place] [address]    Culture  Setup Time   Final    GRAM POSITIVE COCCI AEROBIC BOTTLE ONLY CRITICAL VALUE NOTED.  VALUE IS CONSISTENT WITH PREVIOUSLY REPORTED AND CALLED VALUE. CRITICAL RESULT CALLED TO, READ BACK BY AND VERIFIED WITH: JACKSON  PHARMD AT 0930 ON [id number] BY SJW    Culture (A)  Final    STAPHYLOCOCCUS EPIDERMIDIS SUSCEPTIBILITIES PERFORMED ON PREVIOUS CULTURE WITHIN THE LAST 5 DAYS. Performed at [facility], [place] [address] [place], [place] [address]    Report Status 07/01/2018 FINAL  Final  Culture, Urine     Status: Abnormal   Collection Time: 06/27/18  7:50 PM  Result Value Ref Range Status   Specimen Description   Final    URINE, CLEAN CATCH Performed at [facility]  [place] [address], [place], [place] [address]    Special Requests   Final    NONE Performed at [facility], [place] [address] [place], [place] [address]    Culture >=100,000 COLONIES/mL ENTEROBACTER CLOACAE (A)  Final   Report Status 06/30/2018 FINAL  Final   Organism ID, Bacteria ENTEROBACTER CLOACAE (A)  Final      Susceptibility   Enterobacter cloacae - MIC*    CEFAZOLIN >=64 RESISTANT Resistant     CEFTRIAXONE >=64 RESISTANT Resistant     CIPROFLOXACIN <=0.25  SENSITIVE Sensitive     GENTAMICIN <=1 SENSITIVE Sensitive     IMIPENEM 0.5 SENSITIVE Sensitive     NITROFURANTOIN 64 INTERMEDIATE Intermediate     TRIMETH/SULFA <=20 SENSITIVE Sensitive     PIP/TAZO >=128 RESISTANT Resistant     * >=100,000 COLONIES/mL ENTEROBACTER CLOACAE  Aerobic/Anaerobic Culture (surgical/deep wound)     Status: None   Collection Time: 06/28/18 12:38 AM  Result Value Ref Range Status   Specimen Description   Final    TISSUE RIGHT ARM Performed at [place] [address] [place], [place] [address]    Special Requests   Final    NONE Performed at [facility], [place] [address] [place], [place] [address]    Gram Stain   Final    RARE WBC PRESENT, PREDOMINANTLY MONONUCLEAR NO ORGANISMS SEEN    Culture   Final    NO GROWTH 5 DAYS NO ANAEROBES ISOLATED Performed at [facility], [place] [address] [place], [place] [address]    Report Status 07/03/2018 FINAL  Final  Body fluid culture     Status: None   Collection Time: 06/28/18 12:56 AM  Result Value Ref Range Status   Specimen Description   Final    SYNOVIAL RIGHT KNEE Performed at [facility], [place] [address], [place], [place] [address]    Special Requests   Final    NONE Performed at [facility], [place] [address], [place], [place] [address]    Gram Stain   Final    ABUNDANT WBC PRESENT,BOTH PMN AND MONONUCLEAR NO ORGANISMS SEEN    Culture   Final    NO GROWTH 3 DAYS Performed at [facility], [place] [address] [place], [place] [address]    Report Status 07/01/2018 FINAL  Final  MRSA PCR Screening     Status: None   Collection Time: 06/28/18 12:01 PM  Result Value Ref Range Status   MRSA by PCR NEGATIVE NEGATIVE Final    Comment:        The GeneXpert MRSA Assay (FDA approved for NASAL specimens only), is one component of a comprehensive MRSA colonization surveillance program. It is not intended to diagnose  MRSA infection nor to guide or monitor treatment for MRSA infections. Performed at [facility], [place] [address], [place], [place] [address]       Radiology Studies: Ct Head Code Stroke Wo Contrast  Addendum Date: 07/02/2018   ADDENDUM REPORT: 07/02/2018 14:02 ADDENDUM: Critical Value/emergent results were called by telephone at the time of interpretation on 07/02/2018 at 2:00 pm to Dr. Dana Allan , who verbally acknowledged these results. Electronically Signed   By: Elon Alas M.D.   On: 07/02/2018 14:02   Result Date: 07/02/2018 CLINICAL DATA:  Code stroke.  Altered mental status. EXAM: CT HEAD WITHOUT CONTRAST TECHNIQUE: Contiguous axial images were obtained from the base of  the skull through the vertex without intravenous contrast. COMPARISON:  CT HEAD June 29, 2018 and MRA head June 21, 2018. FINDINGS: Moderately motion degraded examination, not improved on repeat imaging. BRAIN: No intraparenchymal hemorrhage, mass effect nor midline shift. The ventricles and sulci are normal for age. Patchy supratentorial white matter hypodensities less than expected for patient's age, though non-specific are most compatible with chronic small vessel ischemic disease. No acute large vascular territory infarcts. No abnormal extra-axial fluid collections. Basal cisterns are patent. VASCULAR: Moderate calcific atherosclerosis of the carotid siphons. SKULL: No skull fracture. No significant scalp soft tissue swelling. SINUSES/ORBITS: Trace paranasal sinus mucosal thickening, small mucosal retention cysts. Mastoid air cells are well aerated.The included ocular globes and orbital contents are non-suspicious. OTHER: None. ASPECTS [facility] Stroke Program Early CT Score) - Ganglionic level infarction (caudate, lentiform nuclei, internal capsule, insula, M1-M3 cortex): 7 - Supraganglionic infarction (M4-M6 cortex): 3 Total score (0-10 with 10 being normal): 10 IMPRESSION: 1.  Negative motion degraded noncontrast MRI head for age. 2. ASPECTS is 10. Electronically Signed: By: Elon Alas M.D. On: 07/02/2018 13:50     Scheduled Meds: . acetaminophen  500 mg Oral Q12H  . chlorhexidine  15 mL Mouth Rinse BID  . Chlorhexidine Gluconate Cloth  6 each Topical Q0600  . feeding supplement (ENSURE ENLIVE)  237 mL Oral TID BM  . lidocaine  1 patch Transdermal Q12H  . magnesium oxide  400 mg Oral Daily  . mouth rinse  15 mL Mouth Rinse q12n4p  . metoprolol tartrate  12.5 mg Oral BID  . multivitamin  15 mL Oral Daily  . nystatin  5 mL Oral QID  . pantoprazole  40 mg Oral Daily  . potassium chloride SA  40 mEq Oral Daily  . potassium chloride  40 mEq Oral Once  . rivaroxaban  20 mg Oral Q supper  . rosuvastatin  20 mg Oral q1800  . sodium bicarbonate  1,300 mg Oral BID  . thrombin  20,000 Units Topical Once   Continuous Infusions: . sodium chloride Stopped (06/28/18 2353)  . sodium chloride 75 mL/hr at 07/02/18 2056  . albumin human Stopped (07/03/18 5102)  . meropenem (MERREM) IV Stopped (07/03/18 1201)  . [START ON 07/05/2018] vancomycin       LOS: 5 days   Time Spent in minutes   35 Minutes.   Alma Friendly M.D.. on 07/03/2018 at 2:19 PM  Between 7am to 7pm - Please see pager noted on [email or web address]  After 7pm go to [email or web address]  And look for the night coverage person covering for me after hours  [facility]

## 2018-07-03 NOTE — Progress Notes (Signed)
Pharmacy Antibiotic Note  Lauren Richard is a 82 y.o. female admitted on 06/27/2018 with sepsis, suspected right arm infection (r/o necrotizing fascitis).  Pharmacy was initially consulted for broad spectrum antibiotics with Vancomycin and Cefepime and metronidazole dosing per MD.  S/p I&D of arm on 9/18 with no ongoing infection noted, but pending GPC in blood cultures and new Urine culture with Enterobacter cloacae.  Pharmacy is now consulted for continue vancomycin dosing and meropenem dosing.  Tm 100.5, WBC 12.8, SCr increased to 1.54 Vancomycin trough 23 mcg/ml, Pk 40 mcg/ml (9/21) Vancomycin dose administered 9/23  Plan:  Continue meropenem 1g IV q12h.  Decrease vancomycin dosing to 1000 mg q 48h  Check vancomycin levels if remains on vancomycin.  Goal AUC 400-500.  Follow up renal fxn, culture results, and clinical course.  Discuss 5 day stop with MD for meropenem (day 4 of effective therapy)   Height: 5\' 7"  (170.2 cm) Weight: 162 lb 14.7 oz (73.9 kg) IBW/kg (Calculated) : 61.6  Temp (24hrs), Avg:98.7 F (37.1 C), Min:97.5 F (36.4 C), Max:100.5 F (38.1 C)  Recent Labs  Lab 06/28/18 0159 06/28/18 0804 06/28/18 1359 06/29/18 0557 06/29/18 0950 06/30/18 0500 07/01/18 0750 07/01/18 1800 07/01/18 2054 07/02/18 0500 07/03/18 0531  WBC 13.3*  --   --  15.6*  --  12.4* 10.6*  --   --   --  12.8*  CREATININE 0.98  --   --  1.06*  --  1.17* 1.34*  --   --  1.33* 1.54*  LATICACIDVEN 3.9* 5.3* 5.9*  --  2.7* 1.5  --   --   --   --   --   VANCOTROUGH  --   --   --   --   --   --   --  19  --   --  23*  VANCOPEAK  --   --   --   --   --   --   --   --  40  --   --     Estimated Creatinine Clearance: 28.3 mL/min (A) (by C-G formula based on SCr of 1.54 mg/dL (H)).    No Known Allergies  Antimicrobials this admission: 9/17 Clindamycin x1 dose 9/17 Cefepime >> 9/19 9/17 Metronidazole >> 9/19 9/17 Vancomycin >> 9/19 Ceftriaxone >> 9/20 9/20 Meropenem >>   Dose  adjustments this admission: Vancomycin 1250 mg IV q36h > 1000 mg IV q48h  Microbiology results: 9/17 BCx: 2/2 sets, 2/4 bottles Staph Epidermidis (sens: tetracycline, vanc, clinda)     9/18 BCID:  MR staph species (CoNS) 9/17 UCx: >100k Enterobacter Cloacae (sens: cipro, gent, imi, bactrim)  9/18 Right arm tissue (I&D): no org seen, cxt pending 9/18 Right knee synovial fluid: no org seen, cxt pending 9/18 MRSA PCR: negative  Thank you for allowing pharmacy to be a part of this patient's care.  [name], PharmD Clinical Pharmacist  07/03/2018 9:26 AM

## 2018-07-03 NOTE — Progress Notes (Signed)
Two progress notes, one from Dr.Ogbata and one from Dr. Marlou Sa stated that wound vac was to be removed Sunday, wrap arm with ace wrap and pt would follow up with ortho in the office in 10 days. I attempted to remove wound vac and as I removed the medial side drape I could see there was a large gelatinous blood clot on medial side of wound. Drape replaced, wound vac continued (no drainage since 9/20). Medial side of arm is ecchymotic, right radial pulse 2+. Will continue to monitor site. Will notify night shift mid level with Hospitalist group, Ortho may need to be re-consulted in AM. Hoyle Barr, RN

## 2018-07-04 ENCOUNTER — Inpatient Hospital Stay (HOSPITAL_COMMUNITY): Payer: PPO

## 2018-07-04 DIAGNOSIS — Z7189 Other specified counseling: Secondary | ICD-10-CM

## 2018-07-04 DIAGNOSIS — Z90411 Acquired partial absence of pancreas: Secondary | ICD-10-CM

## 2018-07-04 DIAGNOSIS — G893 Neoplasm related pain (acute) (chronic): Secondary | ICD-10-CM

## 2018-07-04 DIAGNOSIS — R7989 Other specified abnormal findings of blood chemistry: Secondary | ICD-10-CM

## 2018-07-04 DIAGNOSIS — R8271 Bacteriuria: Secondary | ICD-10-CM

## 2018-07-04 DIAGNOSIS — Z515 Encounter for palliative care: Secondary | ICD-10-CM

## 2018-07-04 DIAGNOSIS — R7881 Bacteremia: Secondary | ICD-10-CM | POA: Diagnosis present

## 2018-07-04 DIAGNOSIS — C250 Malignant neoplasm of head of pancreas: Secondary | ICD-10-CM

## 2018-07-04 DIAGNOSIS — C787 Secondary malignant neoplasm of liver and intrahepatic bile duct: Secondary | ICD-10-CM

## 2018-07-04 DIAGNOSIS — Z9049 Acquired absence of other specified parts of digestive tract: Secondary | ICD-10-CM

## 2018-07-04 DIAGNOSIS — Z800 Family history of malignant neoplasm of digestive organs: Secondary | ICD-10-CM

## 2018-07-04 DIAGNOSIS — Z95828 Presence of other vascular implants and grafts: Secondary | ICD-10-CM

## 2018-07-04 DIAGNOSIS — Z79899 Other long term (current) drug therapy: Secondary | ICD-10-CM

## 2018-07-04 DIAGNOSIS — A419 Sepsis, unspecified organism: Secondary | ICD-10-CM

## 2018-07-04 DIAGNOSIS — Z96652 Presence of left artificial knee joint: Secondary | ICD-10-CM

## 2018-07-04 LAB — RENAL FUNCTION PANEL
Albumin: 3.2 g/dL — ABNORMAL LOW (ref 3.5–5.0)
Anion gap: 8 (ref 5–15)
BUN: 8 mg/dL (ref 8–23)
CO2: 14 mmol/L — ABNORMAL LOW (ref 22–32)
Calcium: 8.1 mg/dL — ABNORMAL LOW (ref 8.9–10.3)
Chloride: 124 mmol/L — ABNORMAL HIGH (ref 98–111)
Creatinine, Ser: 1.5 mg/dL — ABNORMAL HIGH (ref 0.44–1.00)
GFR calc Af Amer: 37 mL/min — ABNORMAL LOW (ref >60)
GFR calc non Af Amer: 32 mL/min — ABNORMAL LOW (ref >60)
Glucose, Bld: 83 mg/dL (ref 70–99)
Phosphorus: 2.3 mg/dL — ABNORMAL LOW (ref 2.5–4.6)
Potassium: 3.2 mmol/L — ABNORMAL LOW (ref 3.5–5.1)
Sodium: 146 mmol/L — ABNORMAL HIGH (ref 135–145)

## 2018-07-04 LAB — CBC WITH DIFFERENTIAL/PLATELET
Basophils Absolute: 0 10*3/uL (ref 0.0–0.1)
Basophils Relative: 0 %
Eosinophils Absolute: 0.1 10*3/uL (ref 0.0–0.7)
Eosinophils Relative: 1 %
HEMATOCRIT: 21.4 % — AB (ref 36.0–46.0)
HEMOGLOBIN: 7.2 g/dL — AB (ref 12.0–15.0)
LYMPHS ABS: 1.4 10*3/uL (ref 0.7–4.0)
LYMPHS PCT: 11 %
MCH: 30.6 pg (ref 26.0–34.0)
MCHC: 33.6 g/dL (ref 30.0–36.0)
MCV: 91.1 fL (ref 78.0–100.0)
Monocytes Absolute: 1.2 10*3/uL — ABNORMAL HIGH (ref 0.1–1.0)
Monocytes Relative: 9 %
NEUTROS PCT: 79 %
Neutro Abs: 10.6 10*3/uL — ABNORMAL HIGH (ref 1.7–7.7)
Platelets: 243 10*3/uL (ref 150–400)
RBC: 2.35 MIL/uL — AB (ref 3.87–5.11)
RDW: 16.1 % — ABNORMAL HIGH (ref 11.5–15.5)
WBC: 13.3 10*3/uL — AB (ref 4.0–10.5)

## 2018-07-04 LAB — CULTURE, BLOOD (ROUTINE X 2): Special Requests: ADEQUATE

## 2018-07-04 LAB — GLUCOSE, CAPILLARY
GLUCOSE-CAPILLARY: 72 mg/dL (ref 70–99)
GLUCOSE-CAPILLARY: 97 mg/dL (ref 70–99)
Glucose-Capillary: 58 mg/dL — ABNORMAL LOW (ref 70–99)
Glucose-Capillary: 72 mg/dL (ref 70–99)
Glucose-Capillary: 111 mg/dL — ABNORMAL HIGH (ref 70–99)
Glucose-Capillary: 107 mg/dL — ABNORMAL HIGH (ref 70–99)

## 2018-07-04 LAB — PREPARE RBC (CROSSMATCH)

## 2018-07-04 LAB — HEMOGLOBIN AND HEMATOCRIT, BLOOD
HCT: 27.5 % — ABNORMAL LOW (ref 36.0–46.0)
Hemoglobin: 9.4 g/dL — ABNORMAL LOW (ref 12.0–15.0)

## 2018-07-04 LAB — PROCALCITONIN: PROCALCITONIN: 5.67 ng/mL

## 2018-07-04 MED ORDER — ENSURE ENLIVE PO LIQD
237.0000 mL | ORAL | Status: DC
  Administered 2018-07-05 – 2018-07-07 (×2): 237 mL via ORAL

## 2018-07-04 MED ORDER — BOOST / RESOURCE BREEZE PO LIQD CUSTOM
1.0000 | ORAL | Status: DC
  Administered 2018-07-05: 1 via ORAL

## 2018-07-04 MED ORDER — TRAMADOL HCL 50 MG PO TABS
50.0000 mg | ORAL_TABLET | Freq: Two times a day (BID) | ORAL | Status: DC | PRN
  Administered 2018-07-04: 50 mg via ORAL
  Filled 2018-07-04: qty 1

## 2018-07-04 MED ORDER — SODIUM CHLORIDE 0.9% IV SOLUTION: Freq: Once | INTRAVENOUS | Status: DC

## 2018-07-04 MED ORDER — OXYCODONE HCL 5 MG PO TABS
5.0000 mg | ORAL_TABLET | Freq: Four times a day (QID) | ORAL | Status: DC | PRN
  Administered 2018-07-04 – 2018-07-05 (×2): 5 mg via ORAL
  Filled 2018-07-04 (×2): qty 1

## 2018-07-04 MED ORDER — DEXTROSE-NACL 5-0.2 % IV SOLN
INTRAVENOUS | Status: DC
  Administered 2018-07-04 – 2018-07-06 (×2): via INTRAVENOUS

## 2018-07-04 NOTE — Progress Notes (Addendum)
Nutrition Follow-up  DOCUMENTATION CODES:   Non-severe (moderate) malnutrition in context of chronic illness  INTERVENTION:  - Continue Magic Cup once/day. - Will decreased Ensure Enlive to once/day. - Will order Boost Breeze once/day, this supplement provides 250 kcal and 9 grams of protein. - Continue to encourage PO intakes.  - Recommend trial of appetite stimulant.   NUTRITION DIAGNOSIS:   Moderate Malnutrition related to chronic illness, catabolic illness, cancer and cancer related treatments as evidenced by mild fat depletion, mild muscle depletion, moderate muscle depletion. -ongoing  GOAL:   Patient will meet greater than or equal to 90% of their needs -unmet  MONITOR:   PO intake, Supplement acceptance, Weight trends, Labs, Skin  ASSESSMENT:   82 y.o. female with medical history significant of pancreatic cancer s/p whipple in March 2019 found to now have mets to liver. She is on Xeloda. Patient presented to the ED with c/o abdominal pain, generalized weakness, N/V, and constipation. EMS noted temperature of 104 orally. Symptoms severe and constant in nature.  Weight trending up from 9/17-9/20 and no weight since that time. No intakes documented since this RD's visit on 9/18. Patient very drowsy at time of RD visit and no family/visitors present. Spoke with RN prior to entering patient's room. RN reports that patient has not been drinking Ensure, has not been eating, mainly only drinking juice and that patient reports poor appetite/lack of desire to eat.   Patient was unable to provide much information but did deny pain with swallowing, stated having some abdominal pain which she feels is improved with PO intakes. She states she wants help in "getting everything situated." Lunch tray was delivered shortly before RD visit. Encouraged her to eat lunch and eat something at every meal to maintain strength.     Medications reviewed; 400 mg Mag-ox/day, 15 mL liquid  multivitamin/day, 5 mL Mycostatin QID, 40 mEq K-Dur/day. Labs reviewed; Na: 146 mmol/L, K: 3.2 mmol/L, Cl: 124 mmol/L, creatinine: 1.5 mg/dL, Phos: 2.3 mg/dL, GFR: 37 mL/min. IVF; D5-1/2 NS @ 75 mL/hr (306 kcal).   Diet Order:   Diet Order            Diet regular Room service appropriate? Yes; Fluid consistency: Thin  Diet effective now              EDUCATION NEEDS:   No education needs have been identified at this time  Skin:  Skin Assessment: Skin Integrity Issues: Skin Integrity Issues:: Incisions Incisions: R arm and R knee (9/18)  Last BM:  9/23  Height:   Ht Readings from Last 1 Encounters:  06/27/18 5\' 7"  (1.702 m)    Weight:   Wt Readings from Last 1 Encounters:  06/30/18 73.9 kg    Ideal Body Weight:  61.36 kg  BMI:  Body mass index is 25.52 kg/m.  Estimated Nutritional Needs:   Kcal:  8453-6468 (30-33 kcal/kg)  Protein:  95-110 grams (1.4-1.6 grams/kg)  Fluid:  >/= 2 L/day     Jarome Matin, MS, RD, LDN, [facility] Inpatient Clinical Dietitian Pager # [phone] After hours/weekend pager # [phone]

## 2018-07-04 NOTE — Progress Notes (Signed)
Called blood bank, blood not ready

## 2018-07-04 NOTE — Consult Note (Addendum)
Consultation Note Date: 07/04/2018   Patient Name: Lauren Richard  DOB: Oct 10, 1936  MRN: [id number]  Age / Sex: 82 y.o., female  PCP: Glendale Chard, MD Referring Physician: Alma Friendly, MD  Reason for Consultation: Establishing goals of care  HPI/Patient Profile: 82 y.o. female  with past medical history of pancreatic cancer (diagnosed 10/2017- s/p whipple, tx with Gemzar, abraxane, and xeloda- xeloda was held 8/13 due to increasing liver enzymes- plan was to recheck and possibly resume if liver enzymes decreased- she has had several hospital admissions since and has not returned for Oncology followup since 8/13)- other history includes HLD, HTN, arthritis, anemia admitted on 06/27/2018 with abdominal pain, sepsis. CT scan showed progression of liver metastasis, peritoneal carcinomatosis, mesentery mass, and subcutaneous emphysema in R arm (for which she underwent emergent I&D for possible infectious etiology- cultures have since grown negative).  Urine culture was positive for enterobacter cloacae, blood culture was positive for staph- but per infectious disease note- they question if this is contaminant and thus her septic etiology is uncertain. She continues to be treated vancomycin. She is getting blood tranfusions for anemia. She is full code status. On admission her albumin was 1.2 and she is noted to be cachetic. Palliative care consulted for [place].   Clinical Assessment and Goals of Care: Evaluated patient. She was lying in bed awake and alert. Multiple family members in the room- none of whom are HCPOA. All making statements that "she needs to eat to get better".  Patient oriented to place. Tells me she is in the hospital because "I had another stroke". Determined not to be able to participate in [place] at this point without HCPOA present.  Patient acknowledges pain in abdomen that is constant and worsens  with movement. There is positive output in her fecal tube and no complaint of nausea or vomiting. Her abdomen is diffusely tender on palpation. I discussed with her and family that this is due to worsening tumors.  Patient requesting stronger pain medication. Has used tramadol and oxycodone in the past. One family member expressed worry for addiction- explained there is appropriate time and place for opioid pain medication- and cancer pain is one of them. Patient in agreement with plan.   Primary Decision Maker HCPOA- Regina Eck    SUMMARY OF RECOMMENDATIONS -Tramadol 50 mg q12 hr prn moderate pain -Oxycodone IR 5mg  q6hr prn severe pain -GOC meeting arranged for tomorrow at [place]:  Full code  Additional Recommendations (Limitations, Scope, Preferences):  Full Scope Treatment  Prognosis:    Unable to determine  Discharge Planning: To Be Determined  Primary Diagnoses: Present on Admission: . Sepsis ([place]) . Essential hypertension . Pancreatic cancer metastasized to liver ([place]) . Asymptomatic bacteriuria . Peritoneal carcinomatosis ([place]) . Subcutaneous emphysema ([place]) . Hypokalemia . Constipation . Positive blood cultures   I have reviewed the medical record, interviewed the patient and family, and examined the patient. The following aspects are pertinent.  Past Medical History:  Diagnosis Date  .  Adenocarcinoma of head of pancreas ([place]) 11/24/2017  . Arthritis    "all over" (04/26/2017); knees, shoulder  . Essential hypertension 03/03/2017  . Family history of breast cancer   . Family history of kidney cancer   . Family history of pancreatic cancer   . Family history of prostate cancer   . History of blood transfusion 1979   "when I had my hysterectomy"  . Hyperlipidemia   . Hypothyroidism   . Obesity 03/03/2017  . Osteoarthritis 03/03/2017  . Pneumonia    as a child   Social History   Socioeconomic History  . Marital  status: Widowed    Spouse name: Not on file  . Number of children: Not on file  . Years of education: Not on file  . Highest education level: Not on file  Occupational History  . Not on file  Social Needs  . Financial resource strain: Not on file  . Food insecurity:    Worry: Not on file    Inability: Not on file  . Transportation needs:    Medical: Not on file    Non-medical: Not on file  Tobacco Use  . Smoking status: Never Smoker  . Smokeless tobacco: Never Used  Substance and Sexual Activity  . Alcohol use: No    Alcohol/week: 0.0 standard drinks    Frequency: Never    Comment: occasional glass of wine   . Drug use: No  . Sexual activity: Never  Lifestyle  . Physical activity:    Days per week: Not on file    Minutes per session: Not on file  . Stress: Not on file  Relationships  . Social connections:    Talks on phone: Not on file    Gets together: Not on file    Attends religious service: Not on file    Active member of club or organization: Not on file    Attends meetings of clubs or organizations: Not on file    Relationship status: Not on file  Other Topics Concern  . Not on file  Social History Narrative  . Not on file   Family History  Problem Relation Age of Onset  . Kidney cancer Mother 26  . Lung cancer Father 78       lung, smoker  . Congestive Heart Failure Sister   . Asthma Sister   . Atrial fibrillation Sister   . Hypertension Sister   . Diabetes Sister   . Head & neck cancer Paternal Grandmother   . Cancer Maternal Uncle        type unk  . Pancreatic cancer Paternal Aunt        dx >50  . Cancer Paternal Uncle        type unk  . Breast cancer Cousin 20  . Prostate cancer Other 35       had robotic surgery   Scheduled Meds: . sodium chloride   Intravenous Once  . acetaminophen  500 mg Oral Q12H  . chlorhexidine  15 mL Mouth Rinse BID  . Chlorhexidine Gluconate Cloth  6 each Topical Q0600  . feeding supplement  1 Container Oral Q24H   . [START ON 07/05/2018] feeding supplement (ENSURE ENLIVE)  237 mL Oral Q24H  . lidocaine  1 patch Transdermal Q12H  . magnesium oxide  400 mg Oral Daily  . mouth rinse  15 mL Mouth Rinse q12n4p  . metoprolol tartrate  12.5 mg Oral BID  . multivitamin  15 mL Oral Daily  .  nystatin  5 mL Oral QID  . pantoprazole  40 mg Oral Daily  . potassium chloride SA  40 mEq Oral Daily  . rivaroxaban  20 mg Oral Q supper  . rosuvastatin  20 mg Oral q1800  . thrombin  20,000 Units Topical Once   Continuous Infusions: . sodium chloride Stopped (06/28/18 2353)  . dextrose 5 % and 0.2 % NaCl Stopped (07/04/18 1105)  . [START ON 07/05/2018] vancomycin     PRN Meds:.sodium chloride, acetaminophen, bisacodyl, ondansetron **OR** ondansetron (ZOFRAN) IV, oxyCODONE, sodium chloride flush, traMADol Medications Prior to Admission:  Prior to Admission medications   Medication Sig Start Date End Date Taking? Authorizing Provider  acetaminophen (TYLENOL) 500 MG tablet Take 500 mg by mouth every 12 (twelve) hours.   Yes [provider]  amoxicillin-clavulanate (AUGMENTIN) 500-125 MG tablet Take 1 tablet by mouth every 2 (two) hours.   Yes [provider]  capecitabine (XELODA) 500 MG tablet Take 2 tablets (1000mg ) by mouth 2 times daily, within 30 min of finishing food. Take on days 1-7 and 15-21 of each 28 day cycle 05/01/18  Yes Truitt Merle, MD  clopidogrel (PLAVIX) 75 MG tablet Take 1 tablet (75 mg total) by mouth daily. 06/11/18  Yes Thurnell Lose, MD  colchicine 0.6 MG tablet Take 0.6 mg by mouth once.   Yes [provider]  docusate sodium (COLACE) 100 MG capsule Take 100 mg by mouth daily. Hold for loose stool   Yes [provider]  gabapentin (NEURONTIN) 100 MG capsule Take 2 capsules (200 mg total) by mouth 2 (two) times daily. 01/13/18  Yes Stark Klein, MD  Lidocaine (ASPERCREME LIDOCAINE) 4 % PTCH Apply 1 patch topically every 12 (twelve) hours. Lower back   Yes [provider]  magnesium oxide (MAG-OX) 400 MG tablet Take 400 mg by mouth daily.   Yes [provider]  metoprolol tartrate (LOPRESSOR) 25 MG tablet Take 0.5 tablets (12.5 mg total) by mouth 2 (two) times daily. 06/10/18  Yes Thurnell Lose, MD  mirtazapine (REMERON) 7.5 MG tablet Take 1 tablet (7.5 mg total) by mouth at bedtime. 05/23/18  Yes Truitt Merle, MD  nystatin (MYCOSTATIN) 100000 UNIT/ML suspension Take 5 mLs (500,000 Units total) by mouth 4 (four) times daily. 03/24/18  Yes Alla Feeling, NP  oxyCODONE (OXY IR/ROXICODONE) 5 MG immediate release tablet Take 1-2 tablets (5-10 mg total) by mouth every 6 (six) hours as needed for severe pain. 06/10/18  Yes Thurnell Lose, MD  pantoprazole (PROTONIX) 40 MG tablet Take 1 tablet (40 mg total) by mouth daily at 12 noon. Patient taking differently: Take 40 mg by mouth daily.  01/13/18  Yes Stark Klein, MD  potassium chloride SA (K-DUR,KLOR-CON) 20 MEQ tablet Take 2 tablets (40 mEq total) by mouth daily. 04/20/18  Yes Truitt Merle, MD  prochlorperazine (COMPAZINE) 10 MG tablet Take 10 mg by mouth every 6 (six) hours as needed for nausea or vomiting.   Yes [provider]  rivaroxaban (XARELTO) 20 MG TABS tablet Take 1 tablet (20 mg total) by mouth daily with supper. 04/04/18  Yes Sheikh, Omair Latif, DO  rosuvastatin (CRESTOR) 20 MG tablet Take 1 tablet (20 mg total) by mouth daily at 6 PM. 06/10/18  Yes Thurnell Lose, MD  traMADol (ULTRAM) 50 MG tablet Take 1 tablet (50 mg total) by mouth every 12 (twelve) hours as needed (for pain.). Patient taking differently: Take 50 mg by mouth every 8 (eight) hours. Hold  if sleepy or confused 06/10/18  Yes Thurnell Lose, MD  feeding supplement, ENSURE ENLIVE, (ENSURE ENLIVE) LIQD Take 237 mLs by mouth 3 (three) times daily between meals. Patient not taking: Reported on 06/21/2018 04/25/18   Mariel Aloe, MD  lidocaine-prilocaine (EMLA) cream Apply 1 application topically as needed.  04/14/18   Truitt Merle, MD  trolamine salicylate (ASPERCREME) 10 % cream Apply 1 application topically 4 (four) times daily as needed (for knee pain/muscle pain.).     [provider]   No Known Allergies Review of Systems  Constitutional: Positive for appetite change.  Cardiovascular: Negative for chest pain.  Gastrointestinal: Positive for abdominal distention and abdominal pain. Negative for nausea and vomiting.  Psychiatric/Behavioral: Positive for sleep disturbance.    Physical Exam  Constitutional:  Cachetic, anasarca  Musculoskeletal:  temproal muscle wasting  Neurological: She is alert.  Skin: There is pallor.  Psychiatric:  Flat affect  Nursing note and vitals reviewed.   Vital Signs: BP (!) 121/48   Pulse 87   Temp 98.3 F (36.8 C) (Oral)   Resp (!) 25   Ht 5\' 7"  (1.702 m)   Wt 73.9 kg   SpO2 100%   BMI 25.52 kg/m  Pain Scale: 0-10 POSS *See Group Information*: S-Acceptable,Sleep, easy to arouse Pain Score: 0-No pain   SpO2: SpO2: 100 % O2 Device:SpO2: 100 % O2 Flow Rate: .O2 Flow Rate (L/min): 1 L/min  IO: Intake/output summary:   Intake/Output Summary (Last 24 hours) at 07/04/2018 1318 Last data filed at 07/04/2018 0830 Gross per 24 hour  Intake 1748.35 ml  Output 1050 ml  Net 698.35 ml    LBM: Last BM Date: 07/03/18 Baseline Weight: Weight: 68 kg Most recent weight: Weight: 73.9 kg     Palliative Assessment/Data: PPS: 20%     Thank you for this consult. Palliative medicine will continue to follow and assist as needed.   Time In: 1230 Time Out: 1345 Time Total: 75 minutes Greater than 50%  of this time was spent counseling and coordinating care related to the above assessment and plan.  Signed by: Mariana Kaufman, AGNP-C Palliative Medicine    Please contact Palliative Medicine Team phone at [phone] for questions and concerns.  For individual provider: See Shea Evans

## 2018-07-04 NOTE — Progress Notes (Signed)
Patient ID: Lauren Richard, female   DOB: Jul 09, 1936, 82 y.o.   MRN: [id number]         [facility]    Date of Admission:  06/27/2018           Day 8 vancomycin        Day 5 meropenem       Reason for Consult: Sepsis of unknown cause    Referring Provider: Dr. Margarita Rana  Assessment: The cause of her initial febrile presentation and sepsis is not entirely clear.  Although there was initial concern for right arm soft tissue infection the operative findings and culture results did not support this.  Her history suggests that she has only asymptomatic bacteriuria rather than a symptomatic UTI.  She may have true Staph bacteremia but it is also possible that these both represent insignificant contaminants.  I have asked our lab to do susceptibility testing on the second set to see if they are different organisms.  I will continue vancomycin pending those results.  I will stop meropenem now.  Plan: 1. Continue vancomycin pending final blood culture results 2. Discontinue meropenem  Principal Problem:   Sepsis ([employer]) Active Problems:   Asymptomatic bacteriuria   Positive blood cultures   Pancreatic cancer metastasized to liver [facility])   Essential hypertension   Hypokalemia   Peritoneal carcinomatosis (HCC)   Subcutaneous emphysema (HCC)   Constipation   Scheduled Meds: . sodium chloride   Intravenous Once  . acetaminophen  500 mg Oral Q12H  . chlorhexidine  15 mL Mouth Rinse BID  . Chlorhexidine Gluconate Cloth  6 each Topical Q0600  . feeding supplement  1 Container Oral Q24H  . [START ON 07/05/2018] feeding supplement (ENSURE ENLIVE)  237 mL Oral Q24H  . lidocaine  1 patch Transdermal Q12H  . magnesium oxide  400 mg Oral Daily  . mouth rinse  15 mL Mouth Rinse q12n4p  . metoprolol tartrate  12.5 mg Oral BID  . multivitamin  15 mL Oral Daily  . nystatin  5 mL Oral QID  . pantoprazole  40 mg Oral Daily  . potassium chloride SA  40 mEq Oral Daily    . rivaroxaban  20 mg Oral Q supper  . rosuvastatin  20 mg Oral q1800  . thrombin  20,000 Units Topical Once   Continuous Infusions: . sodium chloride Stopped (06/28/18 2353)  . dextrose 5 % and 0.2 % NaCl Stopped (07/04/18 1105)  . meropenem (MERREM) IV Stopped (07/04/18 1015)  . [START ON 07/05/2018] vancomycin     PRN Meds:.sodium chloride, acetaminophen, bisacodyl, ondansetron **OR** ondansetron (ZOFRAN) IV, sodium chloride flush  HPI: Lauren Richard is a 82 y.o. female who underwent a Whipple's procedure this spring for adenocarcinoma of the pancreas.  She has also been receiving chemotherapy but has not tolerated it well.  She was readmitted to the hospital on 06/27/2018 with abdominal pain, fever, hypotension, leukocytosis and an elevated lactic acid level.  Abdominal CT scan showed progressive metastatic disease with increased liver mets and peritoneal carcinomatosis.  No intra-abdominal source of infection was noted but there was an incidental finding of air in her right upper arm.  She had a CT scan of her arm which confirmed the air.  She had had an IV in her right arm that infiltrated shortly before that and scan.  She underwent emergent I&D but all tissue appeared healthy and operative Gram stain and cultures were negative.  A right knee  effusion was noted at the time of surgery and she underwent aspiration of her right knee.  Synovial fluid studies were negative for infection or crystal induced arthritis.  Urinalysis showed pyuria and her urine culture grew Enterobacter.  She denies any recent dysuria.  Both admission blood cultures grew Staph epidermidis.  Susceptibilities were performed on 1 set and that organism is methicillin-resistant.  She has defervesced on broad empiric antibiotic therapy.  Her mental status has waxed and waned over the past week.  She is now afebrile.   Review of Systems: Review of Systems  Unable to perform ROS: Critical illness    Past Medical History:   Diagnosis Date  . Adenocarcinoma of head of pancreas ([place]) 11/24/2017  . Arthritis    "all over" (04/26/2017); knees, shoulder  . Essential hypertension 03/03/2017  . Family history of breast cancer   . Family history of kidney cancer   . Family history of pancreatic cancer   . Family history of prostate cancer   . History of blood transfusion 1979   "when I had my hysterectomy"  . Hyperlipidemia   . Hypothyroidism   . Obesity 03/03/2017  . Osteoarthritis 03/03/2017  . Pneumonia    as a child    Social History   Tobacco Use  . Smoking status: Never Smoker  . Smokeless tobacco: Never Used  Substance Use Topics  . Alcohol use: No    Alcohol/week: 0.0 standard drinks    Frequency: Never    Comment: occasional glass of wine   . Drug use: No    Family History  Problem Relation Age of Onset  . Kidney cancer Mother 67  . Lung cancer Father 84       lung, smoker  . Congestive Heart Failure Sister   . Asthma Sister   . Atrial fibrillation Sister   . Hypertension Sister   . Diabetes Sister   . Head & neck cancer Paternal Grandmother   . Cancer Maternal Uncle        type unk  . Pancreatic cancer Paternal Aunt        dx >50  . Cancer Paternal Uncle        type unk  . Breast cancer Cousin 65  . Prostate cancer Other 92       had robotic surgery   No Known Allergies  OBJECTIVE: Blood pressure (!) 121/48, pulse 87, temperature 98.3 F (36.8 C), temperature source Oral, resp. rate (!) 25, height 5\' 7"  (1.702 m), weight 73.9 kg, SpO2 100 %.  Physical Exam  Constitutional:  She is very thin and appears very weak.  She is very slow to respond to questions.  HENT:  Mouth/Throat: No oropharyngeal exudate.  Eyes: Conjunctivae are normal.  Neck: Neck supple.  Cardiovascular: Normal rate, regular rhythm and normal heart sounds.  No murmur heard. Pulmonary/Chest: Effort normal and breath sounds normal.    Abdominal: Soft. She exhibits no distension. There is tenderness.   Healed midline incision.  Musculoskeletal:  She has a healed incision over her left prosthetic knee.  Skin:  Her right upper arm incision looks good.  There is no evidence of infection.    Lab Results Lab Results  Component Value Date   WBC 13.3 (H) 07/04/2018   HGB 7.2 (L) 07/04/2018   HCT 21.4 (L) 07/04/2018   MCV 91.1 07/04/2018   PLT 243 07/04/2018    Lab Results  Component Value Date   CREATININE 1.50 (H) 07/04/2018   BUN 8  07/04/2018   NA 146 (H) 07/04/2018   K 3.2 (L) 07/04/2018   CL 124 (H) 07/04/2018   CO2 14 (L) 07/04/2018    Lab Results  Component Value Date   ALT 26 06/29/2018   AST 56 (H) 06/29/2018   ALKPHOS 222 (H) 06/29/2018   BILITOT 1.0 06/29/2018     Microbiology: Recent Results (from the past 240 hour(s))  Blood Culture (routine x 2)     Status: Abnormal   Collection Time: 06/27/18  2:36 PM  Result Value Ref Range Status   Specimen Description   Final    BLOOD RIGHT ANTECUBITAL Performed at [facility], [place] [address] [place], [place] [address]    Special Requests   Final    BOTTLES DRAWN AEROBIC AND ANAEROBIC Blood Culture adequate volume Performed at [place] [address], [place], [place] [address]    Culture  Setup Time   Final    ANAEROBIC BOTTLE ONLY GRAM POSITIVE COCCI CRITICAL RESULT CALLED TO, READ BACK BY AND VERIFIED WITH: Shelda Jakes [facility] 06/28/18 1939 JDW Performed at [facility], [address] [place], [place] [address]    Culture STAPHYLOCOCCUS EPIDERMIDIS (A)  Final   Report Status 07/01/2018 FINAL  Final   Organism ID, Bacteria STAPHYLOCOCCUS EPIDERMIDIS  Final      Susceptibility   Staphylococcus epidermidis - MIC*    CIPROFLOXACIN >=8 RESISTANT Resistant     ERYTHROMYCIN <=0.25 SENSITIVE Sensitive     GENTAMICIN 8 INTERMEDIATE Intermediate     OXACILLIN >=4 RESISTANT Resistant     TETRACYCLINE 2 SENSITIVE Sensitive     VANCOMYCIN 2 SENSITIVE Sensitive      TRIMETH/SULFA 160 RESISTANT Resistant     CLINDAMYCIN <=0.25 SENSITIVE Sensitive     RIFAMPIN <=0.5 SENSITIVE Sensitive     Inducible Clindamycin NEGATIVE Sensitive     * STAPHYLOCOCCUS EPIDERMIDIS  Blood Culture ID Panel (Reflexed)     Status: Abnormal   Collection Time: 06/27/18  2:36 PM  Result Value Ref Range Status   Enterococcus species NOT DETECTED NOT DETECTED Final   Listeria monocytogenes NOT DETECTED NOT DETECTED Final   Staphylococcus species DETECTED (A) NOT DETECTED Final    Comment: Methicillin (oxacillin) resistant coagulase negative staphylococcus. Possible blood culture contaminant (unless isolated from more than one blood culture draw or clinical case suggests pathogenicity). No antibiotic treatment is indicated for blood  culture contaminants. CRITICAL RESULT CALLED TO, READ BACK BY AND VERIFIED WITH: Shelda Jakes [facility] 06/28/18 1939 JDW    Staphylococcus aureus NOT DETECTED NOT DETECTED Final   Methicillin resistance DETECTED (A) NOT DETECTED Final    Comment: CRITICAL RESULT CALLED TO, READ BACK BY AND VERIFIED WITH: Shelda Jakes [facility] 06/28/18 1939 JDW    Streptococcus species NOT DETECTED NOT DETECTED Final   Streptococcus agalactiae NOT DETECTED NOT DETECTED Final   Streptococcus pneumoniae NOT DETECTED NOT DETECTED Final   Streptococcus pyogenes NOT DETECTED NOT DETECTED Final   Acinetobacter baumannii NOT DETECTED NOT DETECTED Final   Enterobacteriaceae species NOT DETECTED NOT DETECTED Final   Enterobacter cloacae complex NOT DETECTED NOT DETECTED Final   Escherichia coli NOT DETECTED NOT DETECTED Final   Klebsiella oxytoca NOT DETECTED NOT DETECTED Final   Klebsiella pneumoniae NOT DETECTED NOT DETECTED Final   Proteus species NOT DETECTED NOT DETECTED Final   Serratia marcescens NOT DETECTED NOT DETECTED Final   Haemophilus influenzae NOT DETECTED NOT DETECTED Final   Neisseria meningitidis NOT DETECTED NOT DETECTED Final  Pseudomonas aeruginosa NOT DETECTED  NOT DETECTED Final   Candida albicans NOT DETECTED NOT DETECTED Final   Candida glabrata NOT DETECTED NOT DETECTED Final   Candida krusei NOT DETECTED NOT DETECTED Final   Candida parapsilosis NOT DETECTED NOT DETECTED Final   Candida tropicalis NOT DETECTED NOT DETECTED Final    Comment: Performed at [facility], [place] [address], [place], [place] [address]  Blood Culture (routine x 2)     Status: Abnormal (Preliminary result)   Collection Time: 06/27/18  5:52 PM  Result Value Ref Range Status   Specimen Description   Final    BLOOD LEFT ARM Performed at [place] [address], [place], [place] [address]    Special Requests   Final    BOTTLES DRAWN AEROBIC AND ANAEROBIC Blood Culture adequate volume Performed at [place] [address], [place], [place] [address]    Culture  Setup Time   Final    GRAM POSITIVE COCCI AEROBIC BOTTLE ONLY CRITICAL VALUE NOTED.  VALUE IS CONSISTENT WITH PREVIOUSLY REPORTED AND CALLED VALUE. CRITICAL RESULT CALLED TO, READ BACK BY AND VERIFIED WITH: JACKSON  PHARMD AT 0930 ON [id number] BY SJW    Culture (A)  Final    STAPHYLOCOCCUS EPIDERMIDIS SUSCEPTIBILITIES TO FOLLOW Performed at [facility], [place] [address] [place], [place] [address]    Report Status PENDING  Incomplete  Culture, Urine     Status: Abnormal   Collection Time: 06/27/18  7:50 PM  Result Value Ref Range Status   Specimen Description   Final    URINE, CLEAN CATCH Performed at [facility], [place] [address] [place], [place] [address]    Special Requests   Final    NONE Performed at [facility], [place] [address] [place], [place] [address]    Culture >=100,000 COLONIES/mL ENTEROBACTER CLOACAE (A)  Final   Report Status 06/30/2018 FINAL  Final   Organism ID, Bacteria ENTEROBACTER CLOACAE (A)  Final      Susceptibility   Enterobacter cloacae - MIC*    CEFAZOLIN >=64 RESISTANT Resistant       CEFTRIAXONE >=64 RESISTANT Resistant     CIPROFLOXACIN <=0.25 SENSITIVE Sensitive     GENTAMICIN <=1 SENSITIVE Sensitive     IMIPENEM 0.5 SENSITIVE Sensitive     NITROFURANTOIN 64 INTERMEDIATE Intermediate     TRIMETH/SULFA <=20 SENSITIVE Sensitive     PIP/TAZO >=128 RESISTANT Resistant     * >=100,000 COLONIES/mL ENTEROBACTER CLOACAE  Aerobic/Anaerobic Culture (surgical/deep wound)     Status: None   Collection Time: 06/28/18 12:38 AM  Result Value Ref Range Status   Specimen Description   Final    TISSUE RIGHT ARM Performed at [place] [address] [place], [place] [address]    Special Requests   Final    NONE Performed at [facility], [place] [address] [place], [place] [address]    Gram Stain   Final    RARE WBC PRESENT, PREDOMINANTLY MONONUCLEAR NO ORGANISMS SEEN    Culture   Final    NO GROWTH 5 DAYS NO ANAEROBES ISOLATED Performed at [facility], [place] [address] [place], [place] [address]    Report Status 07/03/2018 FINAL  Final  Body fluid culture     Status: None   Collection Time: 06/28/18 12:56 AM  Result Value Ref Range Status   Specimen Description   Final    SYNOVIAL  RIGHT KNEE Performed at [facility], [place] [address] [place], [place] [address]    Special Requests   Final    NONE Performed at [facility], [place] [address] [place], [place] [address]    Gram Stain   Final    ABUNDANT WBC PRESENT,BOTH PMN AND MONONUCLEAR NO ORGANISMS SEEN    Culture   Final    NO GROWTH 3 DAYS Performed at [facility], [place] [address], [place], [place] [address]    Report Status 07/01/2018 FINAL  Final  MRSA PCR Screening     Status: None   Collection Time: 06/28/18 12:01 PM  Result Value Ref Range Status   MRSA by PCR NEGATIVE NEGATIVE Final    Comment:        The GeneXpert MRSA Assay (FDA approved for NASAL specimens only), is one component of a comprehensive MRSA  colonization surveillance program. It is not intended to diagnose MRSA infection nor to guide or monitor treatment for MRSA infections. Performed at [facility], [place] [address] [place], [place] [address]   Culture, blood (routine x 2)     Status: None (Preliminary result)   Collection Time: 07/03/18  3:20 PM  Result Value Ref Range Status   Specimen Description   Final    BLOOD PICC LINE Performed at [facility], [place] [address], [place], [place] [address]    Special Requests   Final    BOTTLES DRAWN AEROBIC AND ANAEROBIC Blood Culture adequate volume   Culture   Final    NO GROWTH < 24 HOURS Performed at [facility], [place] [address] [place], [place] [address]    Report Status PENDING  Incomplete  Culture, blood (routine x 2)     Status: None (Preliminary result)   Collection Time: 07/03/18  3:25 PM  Result Value Ref Range Status   Specimen Description   Final    BLOOD PICC LINE Performed at [facility], [place] [address], [place], [place] [address]    Special Requests   Final    BOTTLES DRAWN AEROBIC AND ANAEROBIC Blood Culture adequate volume   Culture   Final    NO GROWTH < 24 HOURS Performed at [facility], [place] [address] [place], [place] [address]    Report Status PENDING  Incomplete    Michel Bickers, MD [facility] [employer] [phone] pager   [phone] cell 07/04/2018, 12:09 PM

## 2018-07-04 NOTE — Progress Notes (Signed)
PROGRESS NOTE    BOB [name]  ZHY:[id number] DOB: 04-Jun-1936 DOA: 06/27/2018 PCP: [place] Chard, MD   Brief Narrative:  HPI On 06/28/2018 by Dr. Jennette Kettle EVALENA FUJII is a 82 y.o. female with medical history significant of Pancreatic cancer s/p whipple on chemo with mets to liver, presents to the ED with c/o abd pain, generalized weakness, N/V, constipation. EMS noted temp of 104 orally. Admitted with sepsis and further management   Assessment & Plan   Sepsis secondary to possible intra-abdominal source vs UTI Currently afebrile with uptrending leukocytosis Patient presented with fever, tachycardia, tachypnea with leukocytosis Urine culture is grew Enterobacter Cloacae Blood culture: Anaerobic culture shows methicillin resistant staph coag neg (likely contaminant). Repeat BC cultures pending Procalcitonin 5.67, will trend ID on board, appreciate recs, stopped Meropenem (total of 5 days for UTI), will continue Vancomycin as pt may have true staph bacteremia Repeat CXR with questionable mild R perihilar infiltrate, will hold off on treating for now  Continue IV vancomycin  Acute metabolic encephalopathy Improved ABG WNL CT head unremarkable Management as above  AKI with metabolic acidosis Worsening Cr, likely due to supratherapeutic Vanc levels on 07/03/18 Adjust Vanc level per pharmacy Strict I&O, avoid nephrotics Continue IVF, bicarb tabs  Cellulitis/gas in Right arm noted on CT scan Orthopedic surgery consulted and appreciated Status post right arm incision and drainage.  Gram stain shows no organisms, culture no growth Patient also had right knee aspiration; Gram stain shows no organisms, culture no growth S/P removal wound VAC per ortho on 07/03/18, placed ACE wrap. Follow up in the office in 10 days post discharge  Hypoalbuminemia May be related to severe malnutrition. Was started on IV Albumin  Overall, patient's prognosis is guarded  Essential  hypertension BP stable, continue metoprolol  Elevated troponin Chest pain free Troponin cycled and trending downward Suspect secondary to demand ischemia   Chronic normocytic anemia Baseline hemoglobin around 10, currently 7.2, will transfuse with 1U of PRBC Suspect dilutional component, no signs of bleeding noted Daily CBC  History of PE Continue Xarelto   Adenocarcinoma of the head of the pancreas S/p Whipple's procedure March 2019 Follows with Dr. Burr Medico, oncology Was on Xeloda (currently held)  IV access Patient does have a port, however tip is not correct positioning and would not be used (under policy) to administer vesicant drugs (such as Vanc) PCCM was consulted for placement of IJ or central venous access, however patient and family refused Currently has midline  Code discussion/poor prognosis Palliative care consulted     DVT Prophylaxis  Xarelto  Code Status: FULL  Family Communication: None at bedside  Disposition Plan: SNF  Consultants PCCM Orthopedics  Procedures  R knee aspiration R arm I&D with wound vac placement  Antibiotics   Anti-infectives (From admission, onward)   Start     Dose/Rate Route Frequency Ordered Stop   07/05/18 0630  vancomycin (VANCOCIN) IVPB 1000 mg/200 mL premix     1,000 mg 200 mL/hr over 60 Minutes Intravenous Every 48 hours 07/03/18 0941     06/30/18 1130  meropenem (MERREM) 1 g in sodium chloride 0.9 % 100 mL IVPB  Status:  Discontinued     1 g 200 mL/hr over 30 Minutes Intravenous Every 12 hours 06/30/18 1053 07/04/18 1223   06/29/18 2200  cefTRIAXone (ROCEPHIN) 1 g in sodium chloride 0.9 % 100 mL IVPB  Status:  Discontinued     1 g 200 mL/hr over 30 Minutes Intravenous Every 24 hours 06/29/18  1346 06/30/18 1053   06/28/18 1800  vancomycin (VANCOCIN) 1,250 mg in sodium chloride 0.9 % 250 mL IVPB  Status:  Discontinued     1,250 mg 166.7 mL/hr over 90 Minutes Intravenous Every 36 hours 06/28/18 0259 07/03/18 0941    06/28/18 0600  ceFEPIme (MAXIPIME) 1 g in sodium chloride 0.9 % 100 mL IVPB  Status:  Discontinued     1 g 200 mL/hr over 30 Minutes Intravenous Every 12 hours 06/28/18 0201 06/29/18 1346   06/27/18 2215  vancomycin (VANCOCIN) IVPB 1000 mg/200 mL premix     1,000 mg 200 mL/hr over 60 Minutes Intravenous  Once 06/27/18 2205 06/28/18 0035   06/27/18 2215  clindamycin (CLEOCIN) IVPB 600 mg     600 mg 100 mL/hr over 30 Minutes Intravenous  Once 06/27/18 2205 06/28/18 1930   06/27/18 1445  ceFEPIme (MAXIPIME) 2 g in sodium chloride 0.9 % 100 mL IVPB     2 g 200 mL/hr over 30 Minutes Intravenous  Once 06/27/18 1430 06/27/18 1606   06/27/18 1445  metroNIDAZOLE (FLAGYL) IVPB 500 mg  Status:  Discontinued     500 mg 100 mL/hr over 60 Minutes Intravenous Every 8 hours 06/27/18 1430 06/29/18 1349      Subjective:  Patient denies any abdominal pain, SOB, chest pain, N/V/D, fever/chills  Objective:   Vitals:   07/04/18 0938 07/04/18 1100 07/04/18 1122 07/04/18 1140  BP:  (!) 120/53 (!) 121/48   Pulse:  89 87   Resp: (!) 27 (!) 27 (!) 25   Temp:  98.3 F (36.8 C) 98.4 F (36.9 C) 98.3 F (36.8 C)  TempSrc:  Oral Oral Oral  SpO2:  98% 100%   Weight:      Height:       Exam  General: NAD, awake, alert, chronically ill  HEENT: Pallor    Neck: Supple  Cardiovascular: S1 S2   Respiratory: Clear to auscultation bilaterally, no wheezing   Abdomen: Soft, NT, ND, BS present   Extremities: Right upper arm with ACE wrap, edema of the hand and foot.  Left foot and ankle wrapped.    Neuro: AAOx3, nonfocal  Psych: Normal mood   Intake/Output Summary (Last 24 hours) at 07/04/2018 1325 Last data filed at 07/04/2018 0830 Gross per 24 hour  Intake 1748.35 ml  Output 1050 ml  Net 698.35 ml   Filed Weights   06/27/18 1415 06/30/18 0448  Weight: 68 kg 73.9 kg   Data Reviewed: I have personally reviewed following labs and imaging studies  CBC: Recent Labs  Lab 06/27/18 1435   06/29/18 0557 06/30/18 0500 07/01/18 0750 07/03/18 0531 07/04/18 0543  WBC 15.7*   < > 15.6* 12.4* 10.6* 12.8* 13.3*  NEUTROABS 12.7*  --   --   --  7.9* 9.7* 10.6*  HGB 11.4*   < > 9.7* 9.3* 8.7* 7.5* 7.2*  HCT 35.4*   < > 29.7* 27.8* 26.3* 22.2* 21.4*  MCV 94.9   < > 95.2 94.2 93.3 92.1 91.1  PLT 352   < > 275 265 271 244 243   < > = values in this interval not displayed.   Basic Metabolic Panel: Recent Labs  Lab 06/28/18 0159  06/30/18 0500 07/01/18 0750 07/02/18 0500 07/03/18 0531 07/04/18 0543  NA 147*   < > 142 147* 141 141 146*  K 2.9*   < > 3.2* 2.9* 3.6 4.0 3.2*  CL 118*   < > 121* 126* 121* 119* 124*  CO2 20*   < > 17* 16* 15* 16* 14*  GLUCOSE 87   < > 72 91 80 103* 83  BUN 10   < > 10 8 8 10 8   CREATININE 0.98   < > 1.17* 1.34* 1.33* 1.54* 1.50*  CALCIUM 8.0*   < > 7.5* 7.5* 7.4* 7.7* 8.1*  MG 1.8  --   --  1.7  --  1.9  --   PHOS  --   --   --  2.5 2.5 2.5 2.3*   < > = values in this interval not displayed.   GFR: Estimated Creatinine Clearance: 29.1 mL/min (A) (by C-G formula based on SCr of 1.5 mg/dL (H)). Liver Function Tests: Recent Labs  Lab 06/27/18 1435 06/29/18 0557 07/01/18 0750 07/02/18 0500 07/03/18 0531 07/04/18 0543  AST 56* 56*  --   --   --   --   ALT 24 26  --   --   --   --   ALKPHOS 261* 222*  --   --   --   --   BILITOT 0.9 1.0  --   --   --   --   PROT 7.3 6.2*  --   --   --   --   ALBUMIN 1.7* 1.5* 1.3* 1.2* 2.4* 3.2*   Recent Labs  Lab 06/27/18 1435  LIPASE 23   Recent Labs  Lab 07/02/18 1134  AMMONIA 42*   Coagulation Profile: No results for input(s): INR, PROTIME in the last 168 hours. Cardiac Enzymes: Recent Labs  Lab 06/28/18 0159 06/28/18 0804 06/28/18 1322  TROPONINI 0.12* 0.09* 0.10*   BNP (last 3 results) No results for input(s): PROBNP in the last 8760 hours. HbA1C: No results for input(s): HGBA1C in the last 72 hours. CBG: Recent Labs  Lab 07/03/18 2317 07/04/18 0340 07/04/18 0407  07/04/18 0752 07/04/18 1054  GLUCAP 72 58* 72 72 97   Lipid Profile: No results for input(s): CHOL, HDL, LDLCALC, TRIG, CHOLHDL, LDLDIRECT in the last 72 hours. Thyroid Function Tests: No results for input(s): TSH, T4TOTAL, FREET4, T3FREE, THYROIDAB in the last 72 hours. Anemia Panel: No results for input(s): VITAMINB12, FOLATE, FERRITIN, TIBC, IRON, RETICCTPCT in the last 72 hours. Urine analysis:    Component Value Date/Time   COLORURINE AMBER (A) 06/27/2018 1950   APPEARANCEUR TURBID (A) 06/27/2018 1950   LABSPEC 1.017 06/27/2018 1950   PHURINE 7.0 06/27/2018 1950   GLUCOSEU NEGATIVE 06/27/2018 1950   HGBUR LARGE (A) 06/27/2018 1950   BILIRUBINUR NEGATIVE 06/27/2018 [place] NEGATIVE 06/27/2018 1950   PROTEINUR 100 (A) 06/27/2018 1950   NITRITE NEGATIVE 06/27/2018 1950   LEUKOCYTESUR LARGE (A) 06/27/2018 1950   Sepsis Labs: @LABRCNTIP (procalcitonin:4,lacticidven:4)  ) Recent Results (from the past 240 hour(s))  Blood Culture (routine x 2)     Status: Abnormal   Collection Time: 06/27/18  2:36 PM  Result Value Ref Range Status   Specimen Description   Final    BLOOD RIGHT ANTECUBITAL Performed at [facility], [place] [address], [place], [place] [address]    Special Requests   Final    BOTTLES DRAWN AEROBIC AND ANAEROBIC Blood Culture adequate volume Performed at [place] [address], [place],  [address]    Culture  Setup Time   Final    ANAEROBIC BOTTLE ONLY GRAM POSITIVE COCCI CRITICAL RESULT CALLED TO, READ BACK BY AND VERIFIED WITH: Shelda Jakes [facility] 06/28/18 1939 JDW Performed at  [facility], [place] [address] [place], [place] [address]    Culture STAPHYLOCOCCUS EPIDERMIDIS (A)  Final   Report Status 07/01/2018 FINAL  Final   Organism ID, Bacteria STAPHYLOCOCCUS EPIDERMIDIS  Final      Susceptibility   Staphylococcus epidermidis - MIC*    CIPROFLOXACIN >=8 RESISTANT Resistant     ERYTHROMYCIN  <=0.25 SENSITIVE Sensitive     GENTAMICIN 8 INTERMEDIATE Intermediate     OXACILLIN >=4 RESISTANT Resistant     TETRACYCLINE 2 SENSITIVE Sensitive     VANCOMYCIN 2 SENSITIVE Sensitive     TRIMETH/SULFA 160 RESISTANT Resistant     CLINDAMYCIN <=0.25 SENSITIVE Sensitive     RIFAMPIN <=0.5 SENSITIVE Sensitive     Inducible Clindamycin NEGATIVE Sensitive     * STAPHYLOCOCCUS EPIDERMIDIS  Blood Culture ID Panel (Reflexed)     Status: Abnormal   Collection Time: 06/27/18  2:36 PM  Result Value Ref Range Status   Enterococcus species NOT DETECTED NOT DETECTED Final   Listeria monocytogenes NOT DETECTED NOT DETECTED Final   Staphylococcus species DETECTED (A) NOT DETECTED Final    Comment: Methicillin (oxacillin) resistant coagulase negative staphylococcus. Possible blood culture contaminant (unless isolated from more than one blood culture draw or clinical case suggests pathogenicity). No antibiotic treatment is indicated for blood  culture contaminants. CRITICAL RESULT CALLED TO, READ BACK BY AND VERIFIED WITH: Shelda Jakes [facility] 06/28/18 1939 JDW    Staphylococcus aureus NOT DETECTED NOT DETECTED Final   Methicillin resistance DETECTED (A) NOT DETECTED Final    Comment: CRITICAL RESULT CALLED TO, READ BACK BY AND VERIFIED WITH: Shelda Jakes [facility] 06/28/18 1939 JDW    Streptococcus species NOT DETECTED NOT DETECTED Final   Streptococcus agalactiae NOT DETECTED NOT DETECTED Final   Streptococcus pneumoniae NOT DETECTED NOT DETECTED Final   Streptococcus pyogenes NOT DETECTED NOT DETECTED Final   Acinetobacter baumannii NOT DETECTED NOT DETECTED Final   Enterobacteriaceae species NOT DETECTED NOT DETECTED Final   Enterobacter cloacae complex NOT DETECTED NOT DETECTED Final   Escherichia coli NOT DETECTED NOT DETECTED Final   Klebsiella oxytoca NOT DETECTED NOT DETECTED Final   Klebsiella pneumoniae NOT DETECTED NOT DETECTED Final   Proteus species NOT DETECTED NOT DETECTED Final   Serratia  marcescens NOT DETECTED NOT DETECTED Final   Haemophilus influenzae NOT DETECTED NOT DETECTED Final   Neisseria meningitidis NOT DETECTED NOT DETECTED Final   Pseudomonas aeruginosa NOT DETECTED NOT DETECTED Final   Candida albicans NOT DETECTED NOT DETECTED Final   Candida glabrata NOT DETECTED NOT DETECTED Final   Candida krusei NOT DETECTED NOT DETECTED Final   Candida parapsilosis NOT DETECTED NOT DETECTED Final   Candida tropicalis NOT DETECTED NOT DETECTED Final    Comment: Performed at [facility], [place]. [address], [place], [place] [address]  Blood Culture (routine x 2)     Status: Abnormal   Collection Time: 06/27/18  5:52 PM  Result Value Ref Range Status   Specimen Description   Final    BLOOD LEFT ARM Performed at [place] [address] [place], [place] [address]    Special Requests   Final    BOTTLES DRAWN AEROBIC AND ANAEROBIC Blood Culture adequate volume Performed at [facility] [address], [place], [place] [address]    Culture  Setup Time   Final    GRAM POSITIVE COCCI AEROBIC BOTTLE ONLY CRITICAL VALUE NOTED.  VALUE IS CONSISTENT WITH PREVIOUSLY REPORTED AND CALLED VALUE. CRITICAL  RESULT CALLED TO, READ BACK BY AND VERIFIED WITH: JACKSON  PHARMD AT 0930 ON [id number] BY SJW Performed at [facility], [place] [address], [place], [place] [address]    Culture STAPHYLOCOCCUS EPIDERMIDIS (A)  Final   Report Status 07/04/2018 FINAL  Final   Organism ID, Bacteria STAPHYLOCOCCUS EPIDERMIDIS  Final      Susceptibility   Staphylococcus epidermidis - MIC*    CIPROFLOXACIN >=8 RESISTANT Resistant     ERYTHROMYCIN 0.5 SENSITIVE Sensitive     GENTAMICIN 8 INTERMEDIATE Intermediate     OXACILLIN >=4 RESISTANT Resistant     TETRACYCLINE 2 SENSITIVE Sensitive     VANCOMYCIN 1 SENSITIVE Sensitive     TRIMETH/SULFA 160 RESISTANT Resistant     CLINDAMYCIN 1 INTERMEDIATE Intermediate     RIFAMPIN <=0.5 SENSITIVE Sensitive      Inducible Clindamycin NEGATIVE Sensitive     * STAPHYLOCOCCUS EPIDERMIDIS  Culture, Urine     Status: Abnormal   Collection Time: 06/27/18  7:50 PM  Result Value Ref Range Status   Specimen Description   Final    URINE, CLEAN CATCH Performed at [facility], [place] [address], [place], [place] [address]    Special Requests   Final    NONE Performed at [facility], [place] [address] [place], [place] [address]    Culture >=100,000 COLONIES/mL ENTEROBACTER CLOACAE (A)  Final   Report Status 06/30/2018 FINAL  Final   Organism ID, Bacteria ENTEROBACTER CLOACAE (A)  Final      Susceptibility   Enterobacter cloacae - MIC*    CEFAZOLIN >=64 RESISTANT Resistant     CEFTRIAXONE >=64 RESISTANT Resistant     CIPROFLOXACIN <=0.25 SENSITIVE Sensitive     GENTAMICIN <=1 SENSITIVE Sensitive     IMIPENEM 0.5 SENSITIVE Sensitive     NITROFURANTOIN 64 INTERMEDIATE Intermediate     TRIMETH/SULFA <=20 SENSITIVE Sensitive     PIP/TAZO >=128 RESISTANT Resistant     * >=100,000 COLONIES/mL ENTEROBACTER CLOACAE  Aerobic/Anaerobic Culture (surgical/deep wound)     Status: None   Collection Time: 06/28/18 12:38 AM  Result Value Ref Range Status   Specimen Description   Final    TISSUE RIGHT ARM Performed at [place] [address] [place], [place] [address]    Special Requests   Final    NONE Performed at [facility], [place] [address], [place], [place] [address]    Gram Stain   Final    RARE WBC PRESENT, PREDOMINANTLY MONONUCLEAR NO ORGANISMS SEEN    Culture   Final    NO GROWTH 5 DAYS NO ANAEROBES ISOLATED Performed at [facility], [place] [address] [place], [place] [address]    Report Status 07/03/2018 FINAL  Final  Body fluid culture     Status: None   Collection Time: 06/28/18 12:56 AM  Result Value Ref Range Status   Specimen Description   Final    SYNOVIAL RIGHT KNEE Performed at [place], [place] [address], [place], [place] [address]    Special Requests   Final    NONE Performed at [facility], [place] [address], [place], [place] [address]    Gram Stain   Final    ABUNDANT WBC PRESENT,BOTH PMN AND MONONUCLEAR NO ORGANISMS SEEN    Culture   Final    NO GROWTH 3 DAYS Performed at [facility], [place] [address], [place], [place] [address]  Report Status 07/01/2018 FINAL  Final  MRSA PCR Screening     Status: None   Collection Time: 06/28/18 12:01 PM  Result Value Ref Range Status   MRSA by PCR NEGATIVE NEGATIVE Final    Comment:        The GeneXpert MRSA Assay (FDA approved for NASAL specimens only), is one component of a comprehensive MRSA colonization surveillance program. It is not intended to diagnose MRSA infection nor to guide or monitor treatment for MRSA infections. Performed at [facility], [place] [address], [place], [place] [address]   Culture, blood (routine x 2)     Status: None (Preliminary result)   Collection Time: 07/03/18  3:20 PM  Result Value Ref Range Status   Specimen Description   Final    BLOOD PICC LINE Performed at [facility], [place] [address] [place], [place] [address]    Special Requests   Final    BOTTLES DRAWN AEROBIC AND ANAEROBIC Blood Culture adequate volume   Culture   Final    NO GROWTH < 24 HOURS Performed at [facility], [place] [address], [place], [place] [address]    Report Status PENDING  Incomplete  Culture, blood (routine x 2)     Status: None (Preliminary result)   Collection Time: 07/03/18  3:25 PM  Result Value Ref Range Status   Specimen Description   Final    BLOOD PICC LINE Performed at [facility], [place] [address], [place], [place] [address]    Special Requests   Final    BOTTLES DRAWN AEROBIC AND ANAEROBIC Blood Culture adequate volume   Culture   Final    NO GROWTH < 24 HOURS Performed at [facility], [place] [address], [place], [place] [address]    Report Status PENDING  Incomplete      Radiology Studies: Dg Chest Port 1 View  Result Date: 07/04/2018 CLINICAL DATA:  Pancreatic cancer post Whipple procedure with metastases to liver, on chemotherapy, generalized weakness, abdominal pain, nausea, vomiting, constipation, sepsis, leukocytosis EXAM: PORTABLE CHEST 1 VIEW COMPARISON:  Portable exam 0727 hours compared to 06/27/2018 FINDINGS: LEFT subclavian Port-A-Cath unchanged. Stable heart size mediastinal contours for technique. Low lung volumes with increased bibasilar atelectasis. Questionable mild RIGHT perihilar infiltrate. Tiny LEFT pleural effusion. No pneumothorax. IMPRESSION: Increased bibasilar atelectasis with tiny LEFT pleural effusion and questionable mild RIGHT perihilar infiltrate. Electronically Signed   By: Lavonia Dana M.D.   On: 07/04/2018 08:19   Ct Head Code Stroke Wo Contrast  Addendum Date: 07/02/2018   ADDENDUM REPORT: 07/02/2018 14:02 ADDENDUM: Critical Value/emergent results were called by telephone at the time of interpretation on 07/02/2018 at 2:00 pm to Dr. Dana Allan , who verbally acknowledged these results. Electronically Signed   By: Elon Alas M.D.   On: 07/02/2018 14:02   Result Date: 07/02/2018 CLINICAL DATA:  Code stroke.  Altered mental status. EXAM: CT HEAD WITHOUT CONTRAST TECHNIQUE: Contiguous axial images were obtained from the base of the skull through the vertex without intravenous contrast. COMPARISON:  CT HEAD June 29, 2018 and MRA head June 21, 2018. FINDINGS: Moderately motion degraded examination, not improved on repeat imaging. BRAIN: No intraparenchymal hemorrhage, mass effect nor midline shift. The ventricles and sulci are normal for age. Patchy supratentorial white matter hypodensities less than expected for patient's age, though non-specific are most compatible with chronic small vessel ischemic disease. No acute large vascular territory  infarcts. No abnormal extra-axial fluid  collections. Basal cisterns are patent. VASCULAR: Moderate calcific atherosclerosis of the carotid siphons. SKULL: No skull fracture. No significant scalp soft tissue swelling. SINUSES/ORBITS: Trace paranasal sinus mucosal thickening, small mucosal retention cysts. Mastoid air cells are well aerated.The included ocular globes and orbital contents are non-suspicious. OTHER: None. ASPECTS [facility] Stroke Program Early CT Score) - Ganglionic level infarction (caudate, lentiform nuclei, internal capsule, insula, M1-M3 cortex): 7 - Supraganglionic infarction (M4-M6 cortex): 3 Total score (0-10 with 10 being normal): 10 IMPRESSION: 1. Negative motion degraded noncontrast MRI head for age. 2. ASPECTS is 10. Electronically Signed: By: Elon Alas M.D. On: 07/02/2018 13:50     Scheduled Meds: . sodium chloride   Intravenous Once  . acetaminophen  500 mg Oral Q12H  . chlorhexidine  15 mL Mouth Rinse BID  . Chlorhexidine Gluconate Cloth  6 each Topical Q0600  . feeding supplement  1 Container Oral Q24H  . [START ON 07/05/2018] feeding supplement (ENSURE ENLIVE)  237 mL Oral Q24H  . lidocaine  1 patch Transdermal Q12H  . magnesium oxide  400 mg Oral Daily  . mouth rinse  15 mL Mouth Rinse q12n4p  . metoprolol tartrate  12.5 mg Oral BID  . multivitamin  15 mL Oral Daily  . nystatin  5 mL Oral QID  . pantoprazole  40 mg Oral Daily  . potassium chloride SA  40 mEq Oral Daily  . rivaroxaban  20 mg Oral Q supper  . rosuvastatin  20 mg Oral q1800  . thrombin  20,000 Units Topical Once   Continuous Infusions: . sodium chloride Stopped (06/28/18 2353)  . dextrose 5 % and 0.2 % NaCl Stopped (07/04/18 1105)  . [START ON 07/05/2018] vancomycin       LOS: 6 days   Time Spent in minutes   35 Minutes.   Alma Friendly M.D.. on 07/04/2018 at 1:25 PM  Between 7am to 7pm - Please see pager noted on [email or web address]  After 7pm go to [email or web address]  And look for the  night coverage person covering for me after hours  [facility]

## 2018-07-04 NOTE — Consult Note (Signed)
   [facility] CM Inpatient Consult   07/04/2018  Lauren Richard 10-13-1935 [id number]   Patient screened for [facility] services due unplanned readmission risk score of 56% (extreme) and multiple hospitalizations. She was engaged with [facility] in the past but later declined further [facility] follow up.  Chart reviewed. [name] is currently in stepdown unit at [facility]. Noted palliative/goals of care meeting scheduled for tomorrow. Will continue to follow along for disposition planning.   Marthenia Rolling, MSN-Ed, RN,BSN [facility] Liaison [phone]

## 2018-07-04 NOTE — Progress Notes (Signed)
OT Cancellation Note  Patient Details Name: Lauren Richard MRN: [id number] DOB: 01-28-1936   Cancelled Treatment:    Reason Eval/Treat Not Completed: Fatigue/lethargy limiting ability to participate   Spoke with RN who stated pt wasn't able to participate today due to lethargy. Will check back on pt as schedule allows  Kari Baars, [place] [phone] Office- [phone], Edwena Felty D 07/04/2018, 11:56 AM

## 2018-07-05 DIAGNOSIS — R7881 Bacteremia: Secondary | ICD-10-CM

## 2018-07-05 DIAGNOSIS — B957 Other staphylococcus as the cause of diseases classified elsewhere: Secondary | ICD-10-CM

## 2018-07-05 LAB — RENAL FUNCTION PANEL
Albumin: 2.8 g/dL — ABNORMAL LOW (ref 3.5–5.0)
Anion gap: 4 — ABNORMAL LOW (ref 5–15)
BUN: 8 mg/dL (ref 8–23)
CO2: 16 mmol/L — ABNORMAL LOW (ref 22–32)
Calcium: 8.1 mg/dL — ABNORMAL LOW (ref 8.9–10.3)
Chloride: 125 mmol/L — ABNORMAL HIGH (ref 98–111)
Creatinine, Ser: 1.53 mg/dL — ABNORMAL HIGH (ref 0.44–1.00)
GFR calc Af Amer: 36 mL/min — ABNORMAL LOW (ref >60)
GFR calc non Af Amer: 31 mL/min — ABNORMAL LOW (ref >60)
Glucose, Bld: 80 mg/dL (ref 70–99)
Phosphorus: 2.3 mg/dL — ABNORMAL LOW (ref 2.5–4.6)
Potassium: 3.6 mmol/L (ref 3.5–5.1)
Sodium: 145 mmol/L (ref 135–145)

## 2018-07-05 LAB — GLUCOSE, CAPILLARY
GLUCOSE-CAPILLARY: 115 mg/dL — AB (ref 70–99)
GLUCOSE-CAPILLARY: 67 mg/dL — AB (ref 70–99)
GLUCOSE-CAPILLARY: 70 mg/dL (ref 70–99)
GLUCOSE-CAPILLARY: 72 mg/dL (ref 70–99)
GLUCOSE-CAPILLARY: 73 mg/dL (ref 70–99)
GLUCOSE-CAPILLARY: 74 mg/dL (ref 70–99)
Glucose-Capillary: 102 mg/dL — ABNORMAL HIGH (ref 70–99)
Glucose-Capillary: 74 mg/dL (ref 70–99)

## 2018-07-05 LAB — CBC WITH DIFFERENTIAL/PLATELET
BASOS ABS: 0 10*3/uL (ref 0.0–0.1)
BASOS PCT: 0 %
EOS ABS: 0.2 10*3/uL (ref 0.0–0.7)
EOS PCT: 1 %
HCT: 28.3 % — ABNORMAL LOW (ref 36.0–46.0)
Hemoglobin: 9.7 g/dL — ABNORMAL LOW (ref 12.0–15.0)
Lymphocytes Relative: 14 %
Lymphs Abs: 2.1 10*3/uL (ref 0.7–4.0)
MCH: 31.1 pg (ref 26.0–34.0)
MCHC: 34.3 g/dL (ref 30.0–36.0)
MCV: 90.7 fL (ref 78.0–100.0)
MONO ABS: 1.3 10*3/uL — AB (ref 0.1–1.0)
Monocytes Relative: 8 %
NEUTROS ABS: 11.5 10*3/uL — AB (ref 1.7–7.7)
Neutrophils Relative %: 77 %
PLATELETS: 234 10*3/uL (ref 150–400)
RBC: 3.12 MIL/uL — ABNORMAL LOW (ref 3.87–5.11)
RDW: 16.1 % — ABNORMAL HIGH (ref 11.5–15.5)
WBC: 15 10*3/uL — ABNORMAL HIGH (ref 4.0–10.5)

## 2018-07-05 LAB — PROCALCITONIN: PROCALCITONIN: 6.92 ng/mL

## 2018-07-05 MED ORDER — SODIUM CHLORIDE 0.9 % IV BOLUS
500.0000 mL | Freq: Once | INTRAVENOUS | Status: AC
  Administered 2018-07-05: 500 mL via INTRAVENOUS

## 2018-07-05 MED ORDER — DEXTROSE 50 % IV SOLN
INTRAVENOUS | Status: AC
  Administered 2018-07-05: 50 mL via INTRAVENOUS
  Filled 2018-07-05: qty 50

## 2018-07-05 MED ORDER — DEXTROSE 50 % IV SOLN
50.0000 mL | Freq: Once | INTRAVENOUS | Status: AC
  Administered 2018-07-05: 50 mL via INTRAVENOUS

## 2018-07-05 NOTE — Progress Notes (Signed)
Hypoglycemic Event  CBG: 70  Treatment: 4 oz OJ X 2    Symptoms: drowsy  Follow-up CBG: Time:2326 CBG Result: 67  Possible Reasons for Event: decreased appetite   Comments/MD notified:    Tyrone Nine

## 2018-07-05 NOTE — Progress Notes (Signed)
Occupational Therapy Treatment Patient Details Name: Lauren Richard MRN: [id number] DOB: 03/13/1936 Today's Date: 07/05/2018    History of present illness 82 yo female admitted with sepsis. S/P I&D R UE-wound VAC placed. Hx of met pancreatic cancer s/p whipple 12/2017, L TKA 04/2017, obesity, CHF   OT comments  Both arms placed on pillows to for support and to prevent edema. Focus of OT session was positioning and AAROM  Follow Up Recommendations  SNF    Equipment Recommendations  (to be further assessed)    Recommendations for Other Services      Precautions / Restrictions Precautions Precautions: Fall Precaution Comments: R UE Wound VAC; bilateral restricted UE bands in place. Flexi Seal       Mobility Bed Mobility Overal bed mobility: Needs Assistance Bed Mobility: Rolling Rolling: Total assist;+2 for physical assistance         General bed mobility comments: pt very weak and grimaced with activity   Transfers          NT                ADL either performed or assessed with clinical judgement   ADL       Grooming: Total assistance                                                 Cognition Arousal/Alertness: Awake/alert Behavior During Therapy: WFL for tasks assessed/performed Overall Cognitive Status: No family/caregiver present to determine baseline cognitive functioning                                 General Comments: extra time to initiate        Exercises General Exercises - Upper Extremity Shoulder Flexion: AAROM;10 reps;Supine Elbow Flexion: AAROM;10 reps;Supine;Both Elbow Extension: AAROM;10 reps;Both;Supine Wrist Flexion: AAROM;10 reps;Both Wrist Extension: AAROM;10 reps;Both Digit Composite Flexion: AAROM;10 reps;Supine;Both Composite Extension: AAROM;Both;10 reps;Supine   Shoulder Instructions       General Comments      Pertinent Vitals/ Pain       Pain Assessment: Faces Faces Pain  Scale: Hurts whole lot Pain Descriptors / Indicators: Grimacing Pain Intervention(s): Repositioned;Monitored during session         Frequency  Min 2X/week        Progress Toward Goals  OT Goals(current goals can now be found in the care plan section)  Progress towards OT goals: OT to reassess next treatment     Plan Discharge plan remains appropriate       AM-PAC PT "6 Clicks" Daily Activity     Outcome Measure   Help from another person eating meals?: Total Help from another person taking care of personal grooming?: Total Help from another person toileting, which includes using toliet, bedpan, or urinal?: Total Help from another person bathing (including washing, rinsing, drying)?: Total Help from another person to put on and taking off regular upper body clothing?: Total Help from another person to put on and taking off regular lower body clothing?: Total 6 Click Score: 6    End of Session    OT Visit Diagnosis: Muscle weakness (generalized) (M62.81)   Activity Tolerance Patient limited by pain;Patient limited by fatigue   Patient Left in bed;with call bell/phone within reach;with bed alarm set   Nurse Communication Mobility status          Time: 1450-1500 OT Time Calculation (min): 10 min  Charges: OT General Charges $OT Visit: 1 Visit OT Treatments $Therapeutic Activity: 8-22 mins  Lori Redding, OT Acute Rehabilitation Services Pager- [phone] Office- [phone]      REDDING, Lorraine D 07/05/2018, 3:27 PM    

## 2018-07-05 NOTE — Progress Notes (Signed)
[facility] Infusion Coordinator will follow pt with ID team to support home Infusion Pharmacy needs if ordered at DC to home.  If patient discharges after hours, please call [phone].   Larry Sierras 07/05/2018, 11:00 AM

## 2018-07-05 NOTE — Progress Notes (Signed)
Daily Progress Note   Patient Name: Lauren Richard       Date: 07/05/2018 DOB: 11-20-1935  Age: 82 y.o. MRN#: [id number] Attending Physician: Nita Sells, MD Primary Care Physician: Glendale Chard, MD Admit Date: 06/27/2018  Reason for Consultation/Follow-up: Establishing goals of care  Subjective:  Met with patient's niece- Joetta Manners, and sister- Mazie. Patient was in a great deal of pain and unable to participate in [place].  Life history  was reviewed- "Lauren Richard" moved here from [place] after her husband died and is knows as "the life of the party".  She is retired from working in a [employer] farm manager. We discussed her illness trajectory- lack of clinical improvement, progression of cancer. Discussed that she is not eating and drinking.  Nevin Bloodgood and Francee Piccolo have both noted declines in her level of functioning and nutrition over the past few months. They are able to acknowledge that she is on a trajectory towards end of life.  Nevin Bloodgood states that Lauren Richard has been fighting to see her great niece born who has a due date at the end of October.  We discussed EOL wishes- all family members agree that [place] would want to see her family and great nieces and nephews. She would not want to be suffering. They note she would not want to be "a vegetable".  Code status was discussed- we discussed that patient is full code- and if she were to decline to the point that she needed CPR and intubation this would not meet EOL wishes as stated above.  Continued aggressive medical care vs transition to hospice care was discussed and considered in light of patient's goals.  There are many more family members coming into town over the next few days and Nevin Bloodgood and Francee Piccolo wish to discuss this information and make  decisions with them. In the mean time they wish to continue current treatment with the [facility] to stabilize Juanita and take her home with Hospice.   Review of Systems  Unable to perform ROS: Acuity of condition    Length of Stay: 7  Current Medications: Scheduled Meds:  . sodium chloride   Intravenous Once  . acetaminophen  500 mg Oral Q12H  . chlorhexidine  15 mL Mouth Rinse BID  . Chlorhexidine Gluconate Cloth  6 each Topical Q0600  . feeding supplement  1 Container Oral Q24H  . feeding supplement (ENSURE ENLIVE)  237 mL Oral Q24H  . lidocaine  1 patch Transdermal Q12H  . magnesium oxide  400 mg Oral Daily  . mouth rinse  15 mL Mouth Rinse q12n4p  . multivitamin  15 mL Oral Daily  . nystatin  5 mL Oral QID  . pantoprazole  40 mg Oral Daily  . potassium chloride SA  40 mEq Oral Daily  . rivaroxaban  20 mg Oral Q supper  . rosuvastatin  20 mg Oral q1800  . thrombin  20,000 Units Topical Once    Continuous Infusions: . sodium chloride Stopped (06/28/18 2353)  . dextrose 5 % and 0.2 % NaCl 75 mL/hr at 07/04/18 1800  . vancomycin      PRN Meds: sodium chloride, acetaminophen, bisacodyl, ondansetron **OR** ondansetron (ZOFRAN) IV, oxyCODONE, sodium chloride flush, traMADol  Physical Exam  Constitutional:  Cachetic, temporal muscle wasting, diffuse anasarca  Cardiovascular:  tachycardic  Pulmonary/Chest: Effort normal.  Psychiatric:  [place] note and vitals reviewed.           Vital Signs: BP (!) 146/61 (BP Location: Right Arm)   Pulse (!) 111   Temp 98.6 F (37 C) (Oral)   Resp (!) 26   Ht 5' 7"  (1.702 m)   Wt 73.9 kg   SpO2 100%   BMI 25.52 kg/m  SpO2: SpO2: 100 % O2 Device: O2 Device: Nasal Cannula O2 Flow Rate: O2 Flow Rate (L/min): 1 L/min  Intake/output summary:   Intake/Output Summary (Last 24 hours) at 07/05/2018 1014 Last data filed at 07/05/2018 1937 Gross per 24 hour  Intake 2099.81 ml  Output 650 ml  Net 1449.81 ml   LBM: Last BM  Date: 07/03/18 Baseline Weight: Weight: 68 kg Most recent weight: Weight: 73.9 kg       Palliative Assessment/Data: PPS: 20%      Patient Active Problem List   Diagnosis Date Noted  . Positive blood cultures 07/04/2018  . Advanced care planning/counseling discussion   . Palliative care by specialist   . Pain of metastatic malignancy   . Sepsis ([place]) 06/28/2018  . Asymptomatic bacteriuria 06/28/2018  . Peritoneal carcinomatosis ([place]) 06/28/2018  . Subcutaneous emphysema ([place]) 06/28/2018  . Constipation 06/28/2018  . Tachycardia 06/05/2018  . Elevated troponin 06/05/2018  . Dyspnea 06/05/2018  . Abnormal liver function 06/05/2018  . Genetic testing 05/05/2018  . Malnutrition of moderate degree 04/25/2018  . Malignant neoplasm of head of pancreas ([place])   . Heme positive stool   . Syncope 04/23/2018  . Syncope, near 04/22/2018  . Pancreatic cancer metastasized to liver ([place]) 04/22/2018  . Generalized weakness 03/29/2018  . Nausea & vomiting 03/29/2018  . Diarrhea 03/29/2018  . Chemotherapy induced neutropenia ([place]) 03/23/2018  . Family history of pancreatic cancer   . Family history of breast cancer   . Family history of kidney cancer   . Family history of prostate cancer   . Goals of care, counseling/discussion 03/09/2018  . Acute pulmonary embolism ([place]) 03/09/2018  . S/P laparoscopy 01/05/2018  . Adenocarcinoma of head of pancreas ([place]) 11/24/2017  . Acute kidney injury superimposed on chronic kidney disease ([place])   . Hypokalemia   . Anemia   . Biliary obstruction   . Acute kidney injury ([place]) 11/03/2017  . Pancreatic mass 11/03/2017  . Osteoarthritis of left knee 04/26/2017  . S/P total knee replacement using cement, left 04/26/2017  . Essential hypertension 03/03/2017  . Hyperlipidemia 03/03/2017  .  Osteoarthritis 03/03/2017  . Obesity 03/03/2017  . Status post left foot surgery 12/03/2015  . [place] 11/03/2015    Palliative Care Assessment & Plan    Patient Profile: 82 y.o. female  with past medical history of pancreatic cancer (diagnosed 10/2017- s/p whipple, tx with Gemzar, abraxane, and xeloda- xeloda was held 8/13 due to increasing liver enzymes- plan was to recheck and possibly resume if liver enzymes decreased- she has had several hospital admissions since and has not returned for Oncology followup since 8/13)- other history includes HLD, HTN, arthritis, anemia admitted on 06/27/2018 with abdominal pain, sepsis. CT scan showed progression of liver metastasis, peritoneal carcinomatosis, mesentery mass, and subcutaneous emphysema in R arm (for which she underwent emergent I&D for possible infectious etiology- cultures have since grown negative).  Urine culture was positive for enterobacter cloacae, blood culture was positive for staph- but per infectious disease note- they question if this is contaminant and thus her septic etiology is uncertain. She continues to be treated vancomycin. She is getting blood tranfusions for anemia. She is full code status. On admission her albumin was 1.2 and she is noted to be cachetic. Palliative care consulted for [place].   Assessment/Recommendations/Plan   GOC- treat treatable with goal to stabilize pt to d/c home with Hospice under cancer diagnosis- I did express concern to family that patient may not show improvement and GOC will need to be readdressed  PMT will follow and continue discussion with family- they are considering transition to comfort care and DNR  Continue to treat pain  Goals of Care and Additional Recommendations:  Limitations on Scope of Treatment: Full Scope Treatment  Code Status:  Full code  Prognosis:   < 3 months if she is able to survive this hospitalization   Discharge Planning:  To Be Determined  Care plan was discussed with patient's HCPOA- Nevin Bloodgood.  Thank you for allowing the Palliative Medicine Team to assist in the care of this patient.   Time In: 0830 Time Out:  1030 Total Time 120 minutes Prolonged Time Billed Yes      Greater than 50%  of this time was spent counseling and coordinating care related to the above assessment and plan.  Mariana Kaufman, AGNP-C Palliative Medicine   Please contact Palliative Medicine Team phone at [phone] for questions and concerns.

## 2018-07-05 NOTE — Progress Notes (Signed)
Writer paged oncall at 0100 concerning patient low blood pressure(94/45) , Oncall order 524ml bolus, upon completion pts blood pressure is 90/49. Paged oncal with update, Will continue to monitor pt.

## 2018-07-05 NOTE — Progress Notes (Signed)
Patient ID: Lauren Richard, female   DOB: Apr 18, 1936, 82 y.o.   MRN: [id number]         [facility]  Date of Admission:  06/27/2018           Day 9 vancomycin ASSESSMENT: Both sets of admission blood cultures grew staph epidermidis but the isolate's have different susceptibilities suggesting that they are both insignificant contaminants.  I will stop vancomycin now.  PLAN: 1. Discontinue vancomycin 2. I will sign off now  Principal Problem:   Sepsis ([place]) Active Problems:   Asymptomatic bacteriuria   Positive blood cultures   Pancreatic cancer metastasized to liver [facility])   Essential hypertension   Hypokalemia   Peritoneal carcinomatosis ([place])   Subcutaneous emphysema ([place])   Constipation   Advanced care planning/counseling discussion   Palliative care by specialist   Pain of metastatic malignancy   Scheduled Meds: . sodium chloride   Intravenous Once  . acetaminophen  500 mg Oral Q12H  . chlorhexidine  15 mL Mouth Rinse BID  . Chlorhexidine Gluconate Cloth  6 each Topical Q0600  . feeding supplement  1 Container Oral Q24H  . feeding supplement (ENSURE ENLIVE)  237 mL Oral Q24H  . lidocaine  1 patch Transdermal Q12H  . magnesium oxide  400 mg Oral Daily  . mouth rinse  15 mL Mouth Rinse q12n4p  . multivitamin  15 mL Oral Daily  . nystatin  5 mL Oral QID  . pantoprazole  40 mg Oral Daily  . potassium chloride SA  40 mEq Oral Daily  . rivaroxaban  20 mg Oral Q supper  . rosuvastatin  20 mg Oral q1800  . thrombin  20,000 Units Topical Once   Continuous Infusions: . sodium chloride Stopped (06/28/18 2353)  . dextrose 5 % and 0.2 % NaCl 75 mL/hr at 07/04/18 1800  . vancomycin     PRN Meds:.sodium chloride, acetaminophen, bisacodyl, ondansetron **OR** ondansetron (ZOFRAN) IV, oxyCODONE, sodium chloride flush, traMADol   Review of Systems: Review of Systems  Unable to perform ROS: Medical condition    No Known  Allergies  OBJECTIVE: Vitals:   07/05/18 0210 07/05/18 0400 07/05/18 0800 07/05/18 1200  BP: (!) 90/49 110/60 (!) 146/61 (!) 112/57  Pulse:  95 (!) 111 (!) 113  Resp: (!) 25 (!) 21 (!) 26 20  Temp:  98.5 F (36.9 C) 98.6 F (37 C) 98.3 F (36.8 C)  TempSrc:  Axillary Oral Oral  SpO2:  100% 100% 99%  Weight:      Height:       Body mass index is 25.52 kg/m.  Physical Exam  Constitutional:  She is resting quietly in bed.  She is very frail and weak and has difficulty responding to questions.  Cardiovascular: Normal rate, regular rhythm and normal heart sounds.  Pulmonary/Chest: Effort normal and breath sounds normal.  Port-A-Cath site looks okay.    Lab Results Lab Results  Component Value Date   WBC 15.0 (H) 07/05/2018   HGB 9.7 (L) 07/05/2018   HCT 28.3 (L) 07/05/2018   MCV 90.7 07/05/2018   PLT 234 07/05/2018    Lab Results  Component Value Date   CREATININE 1.53 (H) 07/05/2018   BUN 8 07/05/2018   NA 145 07/05/2018   K 3.6 07/05/2018   CL 125 (H) 07/05/2018   CO2 16 (L) 07/05/2018    Lab Results  Component Value Date   ALT 26 06/29/2018   AST 56 (H) 06/29/2018  ALKPHOS 222 (H) 06/29/2018   BILITOT 1.0 06/29/2018     Microbiology: Recent Results (from the past 240 hour(s))  Blood Culture (routine x 2)     Status: Abnormal   Collection Time: 06/27/18  2:36 PM  Result Value Ref Range Status   Specimen Description   Final    BLOOD RIGHT ANTECUBITAL Performed at [facility] [address] [place], [place] [address]    Special Requests   Final    BOTTLES DRAWN AEROBIC AND ANAEROBIC Blood Culture adequate volume Performed at [facility] [address], [place], [place] [address]    Culture  Setup Time   Final    ANAEROBIC BOTTLE ONLY GRAM POSITIVE COCCI CRITICAL RESULT CALLED TO, READ BACK BY AND VERIFIED WITH: Shelda Jakes [facility] 06/28/18 1939 JDW Performed at [facility], [address] [place],  [place] [address]    Culture STAPHYLOCOCCUS EPIDERMIDIS (A)  Final   Report Status 07/01/2018 FINAL  Final   Organism ID, Bacteria STAPHYLOCOCCUS EPIDERMIDIS  Final      Susceptibility   Staphylococcus epidermidis - MIC*    CIPROFLOXACIN >=8 RESISTANT Resistant     ERYTHROMYCIN <=0.25 SENSITIVE Sensitive     GENTAMICIN 8 INTERMEDIATE Intermediate     OXACILLIN >=4 RESISTANT Resistant     TETRACYCLINE 2 SENSITIVE Sensitive     VANCOMYCIN 2 SENSITIVE Sensitive     TRIMETH/SULFA 160 RESISTANT Resistant     CLINDAMYCIN <=0.25 SENSITIVE Sensitive     RIFAMPIN <=0.5 SENSITIVE Sensitive     Inducible Clindamycin NEGATIVE Sensitive     * STAPHYLOCOCCUS EPIDERMIDIS  Blood Culture ID Panel (Reflexed)     Status: Abnormal   Collection Time: 06/27/18  2:36 PM  Result Value Ref Range Status   Enterococcus species NOT DETECTED NOT DETECTED Final   Listeria monocytogenes NOT DETECTED NOT DETECTED Final   Staphylococcus species DETECTED (A) NOT DETECTED Final    Comment: Methicillin (oxacillin) resistant coagulase negative staphylococcus. Possible blood culture contaminant (unless isolated from more than one blood culture draw or clinical case suggests pathogenicity). No antibiotic treatment is indicated for blood  culture contaminants. CRITICAL RESULT CALLED TO, READ BACK BY AND VERIFIED WITH: Shelda Jakes [facility] 06/28/18 1939 JDW    Staphylococcus aureus NOT DETECTED NOT DETECTED Final   Methicillin resistance DETECTED (A) NOT DETECTED Final    Comment: CRITICAL RESULT CALLED TO, READ BACK BY AND VERIFIED WITH: Shelda Jakes [facility] 06/28/18 1939 JDW    Streptococcus species NOT DETECTED NOT DETECTED Final   Streptococcus agalactiae NOT DETECTED NOT DETECTED Final   Streptococcus pneumoniae NOT DETECTED NOT DETECTED Final   Streptococcus pyogenes NOT DETECTED NOT DETECTED Final   Acinetobacter baumannii NOT DETECTED NOT DETECTED Final   Enterobacteriaceae species NOT DETECTED NOT DETECTED Final   Enterobacter  cloacae complex NOT DETECTED NOT DETECTED Final   Escherichia coli NOT DETECTED NOT DETECTED Final   Klebsiella oxytoca NOT DETECTED NOT DETECTED Final   Klebsiella pneumoniae NOT DETECTED NOT DETECTED Final   Proteus species NOT DETECTED NOT DETECTED Final   Serratia marcescens NOT DETECTED NOT DETECTED Final   Haemophilus influenzae NOT DETECTED NOT DETECTED Final   Neisseria meningitidis NOT DETECTED NOT DETECTED Final   Pseudomonas aeruginosa NOT DETECTED NOT DETECTED Final   Candida albicans NOT DETECTED NOT DETECTED Final   Candida glabrata NOT DETECTED NOT DETECTED Final   Candida krusei NOT DETECTED NOT DETECTED Final   Candida parapsilosis NOT DETECTED NOT DETECTED Final  Candida tropicalis NOT DETECTED NOT DETECTED Final    Comment: Performed at [facility], [place] [address] [place], [place] [address]  Blood Culture (routine x 2)     Status: Abnormal   Collection Time: 06/27/18  5:52 PM  Result Value Ref Range Status   Specimen Description   Final    BLOOD LEFT ARM Performed at [facility] [address] [place], [place] [address]    Special Requests   Final    BOTTLES DRAWN AEROBIC AND ANAEROBIC Blood Culture adequate volume Performed at [facility] [address] [place], [place] [address]    Culture  Setup Time   Final    GRAM POSITIVE COCCI AEROBIC BOTTLE ONLY CRITICAL VALUE NOTED.  VALUE IS CONSISTENT WITH PREVIOUSLY REPORTED AND CALLED VALUE. CRITICAL RESULT CALLED TO, READ BACK BY AND VERIFIED WITH: JACKSON  PHARMD AT 0930 ON [id number] BY SJW Performed at [facility], [place] [address], [place], [place] [address]    Culture STAPHYLOCOCCUS EPIDERMIDIS (A)  Final   Report Status 07/04/2018 FINAL  Final   Organism ID, Bacteria STAPHYLOCOCCUS EPIDERMIDIS  Final      Susceptibility   Staphylococcus epidermidis - MIC*    CIPROFLOXACIN >=8 RESISTANT Resistant     ERYTHROMYCIN 0.5 SENSITIVE Sensitive     GENTAMICIN 8  INTERMEDIATE Intermediate     OXACILLIN >=4 RESISTANT Resistant     TETRACYCLINE 2 SENSITIVE Sensitive     VANCOMYCIN 1 SENSITIVE Sensitive     TRIMETH/SULFA 160 RESISTANT Resistant     CLINDAMYCIN 1 INTERMEDIATE Intermediate     RIFAMPIN <=0.5 SENSITIVE Sensitive     Inducible Clindamycin NEGATIVE Sensitive     * STAPHYLOCOCCUS EPIDERMIDIS  Culture, Urine     Status: Abnormal   Collection Time: 06/27/18  7:50 PM  Result Value Ref Range Status   Specimen Description   Final    URINE, CLEAN CATCH Performed at [facility], [place] [address], [place], [place] [address]    Special Requests   Final    NONE Performed at [facility], [place] [address] [place], [place] [address]    Culture >=100,000 COLONIES/mL ENTEROBACTER CLOACAE (A)  Final   Report Status 06/30/2018 FINAL  Final   Organism ID, Bacteria ENTEROBACTER CLOACAE (A)  Final      Susceptibility   Enterobacter cloacae - MIC*    CEFAZOLIN >=64 RESISTANT Resistant     CEFTRIAXONE >=64 RESISTANT Resistant     CIPROFLOXACIN <=0.25 SENSITIVE Sensitive     GENTAMICIN <=1 SENSITIVE Sensitive     IMIPENEM 0.5 SENSITIVE Sensitive     NITROFURANTOIN 64 INTERMEDIATE Intermediate     TRIMETH/SULFA <=20 SENSITIVE Sensitive     PIP/TAZO >=128 RESISTANT Resistant     * >=100,000 COLONIES/mL ENTEROBACTER CLOACAE  Aerobic/Anaerobic Culture (surgical/deep wound)     Status: None   Collection Time: 06/28/18 12:38 AM  Result Value Ref Range Status   Specimen Description   Final    TISSUE RIGHT ARM Performed at [facility] [address], [place], [place] [address]    Special Requests   Final    NONE Performed at [facility], [place] [address], [place],  [address]    Gram Stain   Final    RARE WBC PRESENT, PREDOMINANTLY MONONUCLEAR NO ORGANISMS SEEN    Culture   Final    NO GROWTH 5 DAYS NO ANAEROBES ISOLATED Performed at [facility], [place]  [address], [place], [place] [address]    Report Status 07/03/2018 FINAL  Final  Body fluid culture     Status: None   Collection Time: 06/28/18 12:56 AM  Result Value Ref Range Status   Specimen Description   Final    SYNOVIAL RIGHT KNEE Performed at [facility], [place] [address], [place], [place] [address]    Special Requests   Final    NONE Performed at [facility], [place] [address], [place], [place] [address]    Gram Stain   Final    ABUNDANT WBC PRESENT,BOTH PMN AND MONONUCLEAR NO ORGANISMS SEEN    Culture   Final    NO GROWTH 3 DAYS Performed at [facility], [place] [address], [place], [place] [address]    Report Status 07/01/2018 FINAL  Final  MRSA PCR Screening     Status: None   Collection Time: 06/28/18 12:01 PM  Result Value Ref Range Status   MRSA by PCR NEGATIVE NEGATIVE Final    Comment:        The GeneXpert MRSA Assay (FDA approved for NASAL specimens only), is one component of a comprehensive MRSA colonization surveillance program. It is not intended to diagnose MRSA infection nor to guide or monitor treatment for MRSA infections. Performed at [facility], [place] [address] [place], [place] [address]   Culture, blood (routine x 2)     Status: None (Preliminary result)   Collection Time: 07/03/18  3:20 PM  Result Value Ref Range Status   Specimen Description   Final    BLOOD PICC LINE Performed at [facility], [place] [address], [place], [place] [address]    Special Requests   Final    BOTTLES DRAWN AEROBIC AND ANAEROBIC Blood Culture adequate volume   Culture   Final    NO GROWTH 2 DAYS Performed at [facility], [place] [address] [place], [place] [address]    Report Status PENDING  Incomplete  Culture, blood (routine x 2)     Status: None (Preliminary result)   Collection Time: 07/03/18  3:25 PM  Result Value Ref Range Status   Specimen Description   Final    BLOOD PICC LINE Performed  at [facility], [place] [address], [place], [place] [address]    Special Requests   Final    BOTTLES DRAWN AEROBIC AND ANAEROBIC Blood Culture adequate volume   Culture   Final    NO GROWTH 2 DAYS Performed at [facility], [place] [address], [place],  [address]    Report Status PENDING  Incomplete    Michel Bickers, MD [facility] [phone] pager   [phone] cell 07/05/2018, 2:57 PM

## 2018-07-05 NOTE — Care Management Note (Signed)
Case Management Note  Patient Details  Name: CHALENE TREU MRN: [id number] Date of Birth: 1935/12/17  Subjective/Objective:                  Intraabdominal abcess/Iv d5w0.2ns at 75cc/hrs,iv vancomycin/wbc 15/hgbs-9.7 rec'ing 1 unit prbc's  Action/Plan: Will follow for progression of care and clinical status. Will follow for case management needs none present at this time.  Expected Discharge Date:  (unknown)               Expected Discharge Plan:  Skilled Nursing Facility  In-House Referral:  Clinical Social Work  Discharge planning Services  CM Consult  Post Acute Care Choice:    Choice offered to:     DME Arranged:    DME Agency:     HH Arranged:    [place] Agency:     Status of Service:  In process, will continue to follow  If discussed at Long Length of Stay Meetings, dates discussed:    Additional Comments:  Leeroy Cha, RN 07/05/2018, 8:32 AM

## 2018-07-05 NOTE — Progress Notes (Signed)
[facility] PROGRESS NOTE  Lauren Richard SKA:[id number] DOB: Feb 24, 1936 DOA: 06/27/2018 PCP: Glendale Chard, MD   Narrative: 82 year old female Pancreatic cancer stage Ib (C T2, cN0, CM0)-path stage III diagnosed 10/2007 status post Whipple-Gemzar, Abraxane, Xeloda under care of Dr. Krista Blue fang Pulmonary embolism 02/2018 A. fib prior on Xarelto HTN Hypothyroid Recent admission 8/26-9/1 and ICM drop in EF 55-25%?  Takotsubo-medications were adjusted cardiology saw the patient  Admitted 9/18 with nausea vomiting generalized abdominal pain fever 104 initially hypotensive 86/60 WBC 15.7 T-max in ED 102 CT abdomen pelvis showed moderate stool stercoral colitis advancing metastatic disease and peritoneal carcinomatosis?  Subcu air  Eventually found to have probable Enterobacter infection-blood culture grew MRSA coag negative contaminant repeat blood cultures were done ID saw the patient patient was treated with meropenem Hospitalization complicated by AKI metabolic encephalopathy  A & Plan  Sepsis unclear cause-defer to ID-looks like knee aspiration was done by orthopedics and patient was found to have a cellulitis in the right arm as well-appreciate orthopedic input, appreciate ID input--patient is still hypotensive at times pressures in the 90s and does need stepdown level care until goals can be further clarified  Adenocarcinoma head of pancreas-off Xeloda-Dr. fang will be made aware  Prior pulmonary embolism 02/2018-continuing Xarelto given cancer history and multiple indications  Acute metabolic encephalopathy likely secondary to infectious causes versus AKI-monitor  HTN  Contraction alkalosis in a setting of metabolic acidosis-likely compensator he-acidosis his primary issue probably because of infectious causes, cancer-monitor trends  Adult failure to thrive, cancer cachexia-Albumin 1.2-goals of care performed 9/24 and revisiting the same later   DVT prophylaxis: Xarelto  Code  Status: Full but might be moving to comfort   Family Communication: d/w niece briefly   Disposition Plan: inpatient    Verlon Au, MD  [facility] Direct contact: [phone] --Via amion app OR  --[email or web address]; password [email or web address]  7PM-7AM contact night coverage as above 07/05/2018, 7:43 AM  LOS: 7 days   Consultants:  Oncology  Palliative  Procedures:  multiple  Antimicrobials:  Vancomycin  Interval history/Subjective:  Awake alert but doesn't orient well-no fever-no chills Some confusion--cannot orient well  Objective:  Vitals:  Vitals:   07/05/18 0210 07/05/18 0400  BP: (!) 90/49 110/60  Pulse:  95  Resp: (!) 25 (!) 21  Temp:  98.5 F (36.9 C)  SpO2:  100%    Exam:  . eomi ncat pallor icteric . cta b no rales . s1 s2 no m . abd soft nt nd  . Power intact-sensory intac . Psych-slight confusion   I have personally reviewed the following:   Labs:  Chloride 125 CO2 16 BUN/creatinine 8/1.5 Foss 2.3 albumin 2.8  WBC 15, hemoglobin 9.7 predominant leukocytosis left shift neutrophilia  Imaging studies:  reviewed  Medical tests:   n   Test discussed with performing physician:  n  Decision to obtain old records:  n  Review and summation of old records:  n  Scheduled Meds: . sodium chloride   Intravenous Once  . acetaminophen  500 mg Oral Q12H  . chlorhexidine  15 mL Mouth Rinse BID  . Chlorhexidine Gluconate Cloth  6 each Topical Q0600  . feeding supplement  1 Container Oral Q24H  . feeding supplement (ENSURE ENLIVE)  237 mL Oral Q24H  . lidocaine  1 patch Transdermal Q12H  . magnesium oxide  400 mg Oral Daily  . mouth rinse  15 mL Mouth Rinse q12n4p  . multivitamin  15 mL Oral Daily  .  nystatin  5 mL Oral QID  . pantoprazole  40 mg Oral Daily  . potassium chloride SA  40 mEq Oral Daily  . rivaroxaban  20 mg Oral Q supper  . rosuvastatin  20 mg Oral q1800  . thrombin  20,000 Units Topical Once   Continuous Infusions: . sodium  chloride Stopped (06/28/18 2353)  . dextrose 5 % and 0.2 % NaCl 75 mL/hr at 07/04/18 1800  . vancomycin      Principal Problem:   Sepsis ([place]) Active Problems:   Essential hypertension   Hypokalemia   Pancreatic cancer metastasized to liver [facility])   Asymptomatic bacteriuria   Peritoneal carcinomatosis (HCC)   Subcutaneous emphysema (HCC)   Constipation   Positive blood cultures   Advanced care planning/counseling discussion   Palliative care by specialist   Pain of metastatic malignancy   LOS: 7 days

## 2018-07-05 NOTE — Progress Notes (Signed)
Hypoglycemic Event  CBG: 67  Treatment: 25 ml of D50 IV  Symptoms: drowsy  Follow-up CBG: Time:0000 CBG Result: 92  Possible Reasons for Event:   Comments/MD notified:    Tyrone Nine

## 2018-07-06 ENCOUNTER — Inpatient Hospital Stay (HOSPITAL_COMMUNITY): Payer: PPO

## 2018-07-06 DIAGNOSIS — C786 Secondary malignant neoplasm of retroperitoneum and peritoneum: Secondary | ICD-10-CM

## 2018-07-06 DIAGNOSIS — C801 Malignant (primary) neoplasm, unspecified: Secondary | ICD-10-CM

## 2018-07-06 DIAGNOSIS — Z515 Encounter for palliative care: Secondary | ICD-10-CM

## 2018-07-06 DIAGNOSIS — C259 Malignant neoplasm of pancreas, unspecified: Secondary | ICD-10-CM

## 2018-07-06 DIAGNOSIS — Z7189 Other specified counseling: Secondary | ICD-10-CM

## 2018-07-06 LAB — GLUCOSE, CAPILLARY
GLUCOSE-CAPILLARY: 110 mg/dL — AB (ref 70–99)
GLUCOSE-CAPILLARY: 110 mg/dL — AB (ref 70–99)
GLUCOSE-CAPILLARY: 113 mg/dL — AB (ref 70–99)
GLUCOSE-CAPILLARY: 127 mg/dL — AB (ref 70–99)
GLUCOSE-CAPILLARY: 92 mg/dL (ref 70–99)
GLUCOSE-CAPILLARY: 93 mg/dL (ref 70–99)
Glucose-Capillary: 125 mg/dL — ABNORMAL HIGH (ref 70–99)

## 2018-07-06 LAB — CBC WITH DIFFERENTIAL/PLATELET
BASOS ABS: 0 10*3/uL (ref 0.0–0.1)
BASOS PCT: 0 %
Eosinophils Absolute: 0 10*3/uL (ref 0.0–0.7)
Eosinophils Relative: 0 %
HEMATOCRIT: 28.9 % — AB (ref 36.0–46.0)
HEMOGLOBIN: 9.8 g/dL — AB (ref 12.0–15.0)
Lymphocytes Relative: 10 %
Lymphs Abs: 1.8 10*3/uL (ref 0.7–4.0)
MCH: 31.1 pg (ref 26.0–34.0)
MCHC: 33.9 g/dL (ref 30.0–36.0)
MCV: 91.7 fL (ref 78.0–100.0)
Monocytes Absolute: 1.7 10*3/uL — ABNORMAL HIGH (ref 0.1–1.0)
Monocytes Relative: 9 %
NEUTROS ABS: 15.4 10*3/uL — AB (ref 1.7–7.7)
Neutrophils Relative %: 81 %
Platelets: 217 10*3/uL (ref 150–400)
RBC: 3.15 MIL/uL — AB (ref 3.87–5.11)
RDW: 16.3 % — AB (ref 11.5–15.5)
WBC: 18.9 10*3/uL — AB (ref 4.0–10.5)

## 2018-07-06 LAB — PROCALCITONIN: Procalcitonin: 7.13 ng/mL

## 2018-07-06 MED ORDER — PIPERACILLIN-TAZOBACTAM 3.375 G IVPB
3.3750 g | Freq: Three times a day (TID) | INTRAVENOUS | Status: DC
  Administered 2018-07-06 – 2018-07-07 (×2): 3.375 g via INTRAVENOUS
  Filled 2018-07-06 (×4): qty 50

## 2018-07-06 MED ORDER — DEXTROSE 10 % IV SOLN
INTRAVENOUS | Status: DC
  Administered 2018-07-06 – 2018-07-07 (×2): via INTRAVENOUS
  Filled 2018-07-06 (×2): qty 1000

## 2018-07-06 NOTE — Progress Notes (Signed)
[facility] PROGRESS NOTE  Lauren Richard:[id number] DOB: 03-09-1936 DOA: 06/27/2018 PCP: Glendale Chard, MD   Narrative: 82 year old female Pancreatic cancer stage Ib (C T2, cN0, CM0)-path stage III diagnosed 10/2007 status post Whipple-Gemzar, Abraxane, Xeloda under care of Dr. Krista Blue fang Pulmonary embolism 02/2018 A. fib prior on Xarelto HTN Hypothyroid Recent admission 8/26-9/1 and ICM drop in EF 55-25%?  Takotsubo-medications were adjusted cardiology saw the patient  Admitted 9/18 with nausea vomiting generalized abdominal pain fever 104 initially hypotensive 86/60 WBC 15.7 T-max in ED 102 CT abdomen pelvis showed moderate stool stercoral colitis advancing metastatic disease and peritoneal carcinomatosis?  Subcu air  Eventually found to have probable Enterobacter infection-blood culture grew MRSA coag negative contaminant repeat blood cultures were done ID saw the patient patient was treated with meropenem Hospitalization complicated by AKI metabolic encephalopathy  A & Plan  Sepsis unclear cause-defer to ID-vancomycin discontinued 9/25 given multiple contaminants-monitor trends of fever-see below  Moderate to severe hypoglycemia-CBG 67/70-patient not really eating-placed on D10 infusion-labs a.m.  Adenocarcinoma head of pancreas-off Xeloda-Dr. Annamaria Boots cc'd - palliative care is aware and delineating goals of care  Prior pulmonary embolism 02/2018-continuing Xarelto given cancer history and multiple indications  Acute metabolic encephalopathy likely secondary to infectious causes versus AKI-monitor-labs a.m.  HTN  Contraction alkalosis in a setting of metabolic acidosis-recheck labs a.m.  Adult failure to thrive, cancer cachexia-Albumin 1.2-goals of care performed 9/24 and revisiting the same later  Risk for aspiration-rising white count 9/26 get 2 view x-ray   DVT prophylaxis: Xarelto  Code Status: Full but might be moving to comfort   Family Communication: d/w niece  briefly   Disposition Plan: inpatient    Verlon Au, MD  [facility] Direct contact: [phone] --Via [place]  --[email or web address]; password [email or web address]  7PM-7AM contact night coverage as above 07/06/2018, 7:47 AM  LOS: 8 days   Consultants:  Oncology  Palliative  Procedures:  multiple  Antimicrobials:  Vancomycin  Interval history/Subjective:  Awakens but thinks that she is in [place] that she is in the hospital there She is not coherent enough to have a full discussion with me She has some abdominal tenderness She feels cold  Objective:  Vitals:  Vitals:   07/06/18 0630 07/06/18 0700  BP: 110/60 (!) 110/58  Pulse: (!) 111   Resp: 19 (!) 22  Temp:    SpO2: 100%     Exam:  . eomi ncat pallor icteric bitemporal wasting . cta b no rales . s1 s2 no m . abd soft tender in epigastrium-abdomen seems bloated . Power intact-sensory intact-- . Psych-slight confusion   I have personally reviewed the following:   Labs:  WBC 18, hemoglobin 9.8  Procalcitonin 5.6-6.9-->7.1  Imaging studies:  CXR is pending  Medical tests:   n   Test discussed with performing physician:  n  Decision to obtain old records:  n  Review and summation of old records:  n  Scheduled Meds: . sodium chloride   Intravenous Once  . acetaminophen  500 mg Oral Q12H  . chlorhexidine  15 mL Mouth Rinse BID  . Chlorhexidine Gluconate Cloth  6 each Topical Q0600  . feeding supplement  1 Container Oral Q24H  . feeding supplement (ENSURE ENLIVE)  237 mL Oral Q24H  . lidocaine  1 patch Transdermal Q12H  . magnesium oxide  400 mg Oral Daily  . mouth rinse  15 mL Mouth Rinse q12n4p  . multivitamin  15 mL Oral Daily  .  nystatin  5 mL Oral QID  . pantoprazole  40 mg Oral Daily  . potassium chloride SA  40 mEq Oral Daily  . rivaroxaban  20 mg Oral Q supper  . rosuvastatin  20 mg Oral q1800  . thrombin  20,000 Units Topical Once   Continuous  Infusions: . sodium chloride Stopped (06/28/18 2353)  . dextrose 5 % and 0.2 % NaCl 75 mL/hr at 07/06/18 0600    Principal Problem:   Sepsis ([place]) Active Problems:   Essential hypertension   Hypokalemia   Pancreatic cancer metastasized to liver [facility])   Asymptomatic bacteriuria   Peritoneal carcinomatosis (HCC)   Subcutaneous emphysema (HCC)   Constipation   Positive blood cultures   Advanced care planning/counseling discussion   Palliative care by specialist   Pain of metastatic malignancy   LOS: 8 days

## 2018-07-06 NOTE — Progress Notes (Signed)
Pharmacy Antibiotic Note  Lauren Richard is a 82 y.o. female admitted on 06/27/2018 with zosyn.  Pharmacy has been consulted for aspiration pneumonia dosing.  Plan: Zosyn 3.375g IV Q8H infused over 4hrs. Follow renal function and clinical course  Height: 5\' 7"  (170.2 cm) Weight: 162 lb 14.7 oz (73.9 kg) IBW/kg (Calculated) : 61.6  Temp (24hrs), Avg:98.6 F (37 C), Min:98 F (36.7 C), Max:99.1 F (37.3 C)  Recent Labs  Lab 06/30/18 0500 07/01/18 0750 07/01/18 1800 07/01/18 2054 07/02/18 0500 07/03/18 0531 07/04/18 0543 07/05/18 0500 07/06/18 0330  WBC 12.4* 10.6*  --   --   --  12.8* 13.3* 15.0* 18.9*  CREATININE 1.17* 1.34*  --   --  1.33* 1.54* 1.50* 1.53*  --   LATICACIDVEN 1.5  --   --   --   --   --   --   --   --   VANCOTROUGH  --   --  19  --   --  23*  --   --   --   VANCOPEAK  --   --   --  40  --   --   --   --   --     Estimated Creatinine Clearance: 28.5 mL/min (A) (by C-G formula based on SCr of 1.53 mg/dL (H)).    No Known Allergies   Thank you for allowing pharmacy to be a part of this patient's care.  Dolly Rias RPh 07/06/2018, 4:29 PM Pager [phone]

## 2018-07-06 NOTE — Progress Notes (Signed)
Daily Progress Note   Patient Name: Lauren Richard       Date: 07/06/2018 DOB: 12/30/1935  Age: 82 y.o. MRN#: [id number] Attending Physician: Nita Sells, MD Primary Care Physician: Glendale Chard, MD Admit Date: 06/27/2018  Reason for Consultation/Follow-up: Establishing goals of care  Subjective: Noted patient with episodes of hypoglycemia last night. WBC significantly elevated. Chest xray shows pulmonary edema and possible pneumonia. Discussed above issues with Irven Coe. She agrees with DNR, but for now desires full scope care while she continues to discuss possible transition to comfort with other family members.   ROS  Length of Stay: 8  Current Medications: Scheduled Meds:  . sodium chloride   Intravenous Once  . acetaminophen  500 mg Oral Q12H  . chlorhexidine  15 mL Mouth Rinse BID  . Chlorhexidine Gluconate Cloth  6 each Topical Q0600  . feeding supplement  1 Container Oral Q24H  . feeding supplement (ENSURE ENLIVE)  237 mL Oral Q24H  . lidocaine  1 patch Transdermal Q12H  . magnesium oxide  400 mg Oral Daily  . mouth rinse  15 mL Mouth Rinse q12n4p  . multivitamin  15 mL Oral Daily  . nystatin  5 mL Oral QID  . pantoprazole  40 mg Oral Daily  . potassium chloride SA  40 mEq Oral Daily  . rivaroxaban  20 mg Oral Q supper  . rosuvastatin  20 mg Oral q1800  . thrombin  20,000 Units Topical Once    Continuous Infusions: . sodium chloride Stopped (06/28/18 2353)  . dextrose 50 mL/hr at 07/06/18 1030    PRN Meds: sodium chloride, acetaminophen, bisacodyl, ondansetron **OR** ondansetron (ZOFRAN) IV, oxyCODONE, sodium chloride flush, traMADol  Physical Exam  Cardiovascular:  tachycardic  Neurological:  lethargic  Nursing note and vitals reviewed.      Vital Signs: BP 110/61   Pulse (!) 111   Temp 98.5 F (36.9 C) (Oral)   Resp (!) 22   Ht 5\' 7"  (1.702 m)   Wt 73.9 kg   SpO2 100%   BMI 25.52 kg/m  SpO2: SpO2: 100 % O2 Device: O2 Device: Nasal Cannula O2 Flow Rate: O2 Flow Rate (L/min): 1 L/min  Intake/output summary:   Intake/Output Summary (Last 24 hours) at 07/06/2018 1545 Last data filed at 07/06/2018 0600 Gross per 24 hour  Intake 697.68 ml  Output 450 ml  Net 247.68 ml   LBM: Last BM Date: 07/03/18 Baseline Weight: Weight: 68 kg Most recent weight: Weight: 73.9 kg       Palliative Assessment/Data: PPS: 20%      Patient Active Problem List   Diagnosis Date Noted  . Positive blood cultures 07/04/2018  . Advanced care planning/counseling discussion   . Palliative care by specialist   . Pain of metastatic malignancy   . Sepsis ([place]) 06/28/2018  . Asymptomatic bacteriuria 06/28/2018  . Peritoneal carcinomatosis ([place]) 06/28/2018  . Subcutaneous emphysema ([place]) 06/28/2018  . Constipation 06/28/2018  . Tachycardia 06/05/2018  . Elevated troponin 06/05/2018  . Dyspnea 06/05/2018  . Abnormal liver function 06/05/2018  . Genetic testing 05/05/2018  . Malnutrition of moderate degree 04/25/2018  . Malignant neoplasm of head of pancreas ([place])   . Heme positive stool   . Syncope 04/23/2018  . Syncope, near 04/22/2018  . Pancreatic cancer metastasized to liver ([place]) 04/22/2018  . Generalized weakness 03/29/2018  . Nausea & vomiting 03/29/2018  . Diarrhea 03/29/2018  . Chemotherapy induced neutropenia ([place]) 03/23/2018  . Family history of pancreatic cancer   . Family history of breast cancer   . Family history of kidney cancer   . Family history of prostate cancer   . Goals of care, counseling/discussion 03/09/2018  . Acute pulmonary embolism ([place]) 03/09/2018  . S/P laparoscopy 01/05/2018  . Adenocarcinoma of head of pancreas ([place]) 11/24/2017  . Acute kidney injury superimposed on chronic kidney disease  ([place])   . Hypokalemia   . Anemia   . Biliary obstruction   . Acute kidney injury ([place]) 11/03/2017  . Pancreatic mass 11/03/2017  . Osteoarthritis of left knee 04/26/2017  . S/P total knee replacement using cement, left 04/26/2017  . Essential hypertension 03/03/2017  . Hyperlipidemia 03/03/2017  . Osteoarthritis 03/03/2017  . Obesity 03/03/2017  . Status post left foot surgery 12/03/2015  . Hammertoe 11/03/2015    Palliative Care Assessment & Plan   Patient Profile: 82 y.o. female  with past medical history of pancreatic cancer (diagnosed 10/2017- s/p whipple, tx with Gemzar, abraxane, and xeloda- xeloda was held 8/13 due to increasing liver enzymes- plan was to recheck and possibly resume if liver enzymes decreased- she has had several hospital admissions since and has not returned for Oncology followup since 8/13)- other history includes HLD, HTN, arthritis, anemia admitted on 06/27/2018 with abdominal pain, sepsis. CT scan showed progression of liver metastasis, peritoneal carcinomatosis, mesentery mass, and subcutaneous emphysema in R arm (for which she underwent emergent I&D for possible infectious etiology- cultures have since grown negative).  Urine culture was positive for enterobacter cloacae, blood culture was positive for staph- but per infectious disease note- they question if this is contaminant and thus her septic etiology is uncertain. She continues to be treated vancomycin. Vancomycin stopped on 9/25 d/t determining blood cultures were positive d/t contaminants. She is getting blood tranfusions for anemia. She is full code status. On admission her albumin was 1.2 and she is noted to be cachetic. Palliative care consulted for [place].   Assessment/Recommendations/Plan   DNR  Continue full scope care otherwise while family considers transition to comfort- I discussed with Nevin Bloodgood that I am concerned about patient's declining status- she seemed less alert today and her heart appears to  be working harder- as well as chest xray findings indicating pulmonary edema and possible pnuemoniaNevin Bloodgood requested DNR and otherwise full scope care for now  Goals  of Care and Additional Recommendations:  Limitations on Scope of Treatment: Full Scope Treatment  Code Status:  DNR  Prognosis:   Unable to determine  Discharge Planning:  To Be Determined  Care plan was discussed with patient's niece- Nevin Bloodgood.  Thank you for allowing the Palliative Medicine Team to assist in the care of this patient.   Time In: 1500 Time Out: 1545 Total Time 45 mins Prolonged Time Billed no      Greater than 50%  of this time was spent counseling and coordinating care related to the above assessment and plan.  Mariana Kaufman, AGNP-C Palliative Medicine   Please contact Palliative Medicine Team phone at [phone] for questions and concerns.

## 2018-07-06 NOTE — Progress Notes (Signed)
PT Cancellation Note  Patient Details Name: Lauren Richard MRN: [id number] DOB: [birth year]   Cancelled Treatment:     pt unable to participate.  Pt has been evaluated with rec for SNF   Will cont to monitor   Rica Koyanagi  PTA [place] Pager      [phone] Office      [phone]

## 2018-07-06 NOTE — Progress Notes (Signed)
Called Niece and expressed to her findings of PNA Will start Zosyn---Will convert eventually to oral meds.

## 2018-07-07 DIAGNOSIS — B370 Candidal stomatitis: Secondary | ICD-10-CM

## 2018-07-07 LAB — TYPE AND SCREEN
ABO/RH(D): A POS
Antibody Screen: NEGATIVE
UNIT DIVISION: 0
Unit division: 0

## 2018-07-07 LAB — RENAL FUNCTION PANEL
ANION GAP: 8 (ref 5–15)
Albumin: 2.2 g/dL — ABNORMAL LOW (ref 3.5–5.0)
BUN: 10 mg/dL (ref 8–23)
CALCIUM: 8.1 mg/dL — AB (ref 8.9–10.3)
CO2: 12 mmol/L — AB (ref 22–32)
CREATININE: 2.71 mg/dL — AB (ref 0.44–1.00)
Chloride: 122 mmol/L — ABNORMAL HIGH (ref 98–111)
GFR calc Af Amer: 18 mL/min — ABNORMAL LOW (ref >60)
GFR, EST NON AFRICAN AMERICAN: 16 mL/min — AB (ref >60)
Glucose, Bld: 116 mg/dL — ABNORMAL HIGH (ref 70–99)
PHOSPHORUS: 3.3 mg/dL (ref 2.5–4.6)
Potassium: 4.4 mmol/L (ref 3.5–5.1)
SODIUM: 142 mmol/L (ref 135–145)

## 2018-07-07 LAB — CBC WITH DIFFERENTIAL/PLATELET
BASOS ABS: 0 10*3/uL (ref 0.0–0.1)
BASOS PCT: 0 %
EOS ABS: 0.1 10*3/uL (ref 0.0–0.7)
Eosinophils Relative: 1 %
HEMATOCRIT: 27 % — AB (ref 36.0–46.0)
HEMOGLOBIN: 9.2 g/dL — AB (ref 12.0–15.0)
Lymphocytes Relative: 12 %
Lymphs Abs: 2.1 10*3/uL (ref 0.7–4.0)
MCH: 31 pg (ref 26.0–34.0)
MCHC: 34.1 g/dL (ref 30.0–36.0)
MCV: 90.9 fL (ref 78.0–100.0)
MONOS PCT: 9 %
Monocytes Absolute: 1.5 10*3/uL — ABNORMAL HIGH (ref 0.1–1.0)
NEUTROS ABS: 14 10*3/uL — AB (ref 1.7–7.7)
NEUTROS PCT: 78 %
Platelets: 193 10*3/uL (ref 150–400)
RBC: 2.97 MIL/uL — AB (ref 3.87–5.11)
RDW: 16.5 % — ABNORMAL HIGH (ref 11.5–15.5)
WBC: 17.7 10*3/uL — ABNORMAL HIGH (ref 4.0–10.5)

## 2018-07-07 LAB — BPAM RBC
BLOOD PRODUCT EXPIRATION DATE: 201910172359
Blood Product Expiration Date: 201910142359
ISSUE DATE / TIME: 201909241053
Unit Type and Rh: 6200
Unit Type and Rh: 6200

## 2018-07-07 LAB — GLUCOSE, CAPILLARY
GLUCOSE-CAPILLARY: 97 mg/dL (ref 70–99)
GLUCOSE-CAPILLARY: 107 mg/dL — AB (ref 70–99)
GLUCOSE-CAPILLARY: 110 mg/dL — AB (ref 70–99)
Glucose-Capillary: 100 mg/dL — ABNORMAL HIGH (ref 70–99)
Glucose-Capillary: 96 mg/dL (ref 70–99)

## 2018-07-07 MED ORDER — OXYCODONE HCL 20 MG/ML PO CONC
5.0000 mg | ORAL | Status: DC | PRN
  Administered 2018-07-08 – 2018-07-09 (×2): 5 mg via SUBLINGUAL
  Filled 2018-07-07 (×2): qty 1

## 2018-07-07 MED ORDER — PIPERACILLIN-TAZOBACTAM IN DEX 2-0.25 GM/50ML IV SOLN
2.2500 g | Freq: Four times a day (QID) | INTRAVENOUS | Status: DC
  Administered 2018-07-07 – 2018-07-09 (×8): 2.25 g via INTRAVENOUS
  Filled 2018-07-07 (×11): qty 50

## 2018-07-07 MED ORDER — DEXTROSE-NACL 5-0.9 % IV SOLN
INTRAVENOUS | Status: DC
  Administered 2018-07-07 – 2018-07-08 (×4): via INTRAVENOUS

## 2018-07-07 MED ORDER — FLUCONAZOLE IN SODIUM CHLORIDE 200-0.9 MG/100ML-% IV SOLN
200.0000 mg | Freq: Once | INTRAVENOUS | Status: AC
  Administered 2018-07-07: 200 mg via INTRAVENOUS
  Filled 2018-07-07: qty 100

## 2018-07-07 MED ORDER — FLUCONAZOLE 100MG IVPB
100.0000 mg | INTRAVENOUS | Status: DC
  Filled 2018-07-07: qty 50

## 2018-07-07 NOTE — Progress Notes (Signed)
Pt stable for transport. Full report given to receiving RN Josph Macho on [place] at 1206.

## 2018-07-07 NOTE — Progress Notes (Signed)
Subsequently discussed with niece on phone-have explained circumstances of decompensation AKI and clinical worsening scenario-she confirms DNR status and is okay with hospice I will place a consult for case manager for choice of residential hospice If she is reasonably stabilized she could go there tomorrow however if she declines significantly she will be transferred to palliative unit today and end her days here

## 2018-07-07 NOTE — Progress Notes (Signed)
Pt is refusing PO intake. She allowed RN to swab mouth but was resistant and upset to have anything in her mouth. She has had no urine output since beginning of my shift, 0700. Overnight RN reported 150cc output for her overnight. She stated night MD was aware but I have paged day MD to be sure he is aware of change in patient's status.

## 2018-07-07 NOTE — Progress Notes (Signed)
[facility]ITALIST PROGRESS NOTE  LEVAEH VICE UEA:[id number] DOB: 08/04/1936 DOA: 06/27/2018 PCP: Glendale Chard, MD   Narrative: 82 year old female Pancreatic cancer stage Ib (C T2, cN0, CM0)-path stage III diagnosed 10/2007 status post Whipple-Gemzar, Abraxane, Xeloda under care of Dr. Krista Blue fang Pulmonary embolism 02/2018 A. fib prior on Xarelto HTN Hypothyroid Recent admission 8/26-9/1 and ICM drop in EF 55-25%?  Takotsubo-medications were adjusted cardiology saw the patient  Admitted 9/18 with nausea vomiting generalized abdominal pain fever 104 initially hypotensive 86/60 WBC 15.7 T-max in ED 102 CT abdomen pelvis showed moderate stool stercoral colitis advancing metastatic disease and peritoneal carcinomatosis?  Subcu air  Eventually found to have probable Enterobacter infection-blood culture grew MRSA coag negative contaminant repeat blood cultures were done ID saw the patient patient was treated with meropenem Hospitalization complicated by AKI metabolic encephalopathy  A & Plan  AKI-significant bump in creatinine-change fluids to D5/NS 125 cc/H-have to balance with volume overload-she has peripheral edema that is significant-we will repeat labs in a.m. I do not suspect we will be able to fix these issues  Sepsis unclear cause-defer to ID-vancomycin discontinued 9/25 -chest x-ray however 9/26 suspicious for aspiration pneumonia therefore Zosyn started 9/26  Moderate to severe hypoglycemia-CBG 67/70-patient not really eating--suspect end is near  Adenocarcinoma head of pancreas-off Xeloda-Dr. Annamaria Boots cc'd - palliative care is aware and delineating goals of care  Prior pulmonary embolism 02/2018-continuing Xarelto given cancer history and multiple indications  Acute metabolic encephalopathy factorial-AKI-infectious  HTN  Contraction alkalosis in a setting of metabolic acidosis-worsening acidosis confounded by contraction alkalosis, AKI-I again I do not think we can fix her multiple  issues  Adult failure to thrive, cancer cachexia-Albumin 1.2-goals of care performed 9/24 and revisiting the same later   DVT prophylaxis: Xarelto  Code Status: Full but might be moving to comfort   Family Communication: d/w niece in detail on 9/26 disposition Plan: inpatient --- I was not able to discuss with family today but if she continues to decline I will transfer her to palliative floor after we have had a discussion   Ab Leaming, MD  [facility] Direct contact: [phone] --Via amion app OR  --[email or web address]; password [email or web address]  7PM-7AM contact night coverage as above 07/07/2018, 10:36 AM  LOS: 9 days   Consultants:  Oncology  Palliative  Procedures:  multiple  Antimicrobials:  Vancomycin  Zosyn currently since 9/26  Interval history/Subjective:  Coherent-but not completely clear Nursing reports decreased U OP Patient is not eating She seems more listless but is still somewhat interactive  Objective:  Vitals:  Vitals:   07/07/18 0700 07/07/18 0736  BP: (!) 110/40   Pulse: (!) 116   Resp: (!) 24   Temp:  98.3 F (36.8 C)  SpO2: 98%     Exam:  . listless bitemporal wasting . cta b no rales no rhonchi . s1 s2 no m/r/g . abd soft tender in epigastrium-abdomen seems bloated . Grade 3 lower extremity edema . She is slightly confused and is little bit more somnolent than previous   I have personally reviewed the following:   Labs:  WBC 18-->17  BUN/creatinine 8/1.5-10/2.7 chloride 122 calcium 4.4 sodium 142  Procalcitonin 5.6-6.9-->7.1  Imaging studies:  CXR is pending  Medical tests:   n   Test discussed with performing physician:  n  Decision to obtain old records:  n  Review and summation of old records:  n  Scheduled Meds: . sodium chloride   Intravenous Once  . acetaminophen  500 mg  Oral Q12H  . chlorhexidine  15 mL Mouth Rinse BID  . Chlorhexidine Gluconate Cloth  6 each Topical Q0600  . feeding supplement  1  Container Oral Q24H  . feeding supplement (ENSURE ENLIVE)  237 mL Oral Q24H  . lidocaine  1 patch Transdermal Q12H  . magnesium oxide  400 mg Oral Daily  . mouth rinse  15 mL Mouth Rinse q12n4p  . multivitamin  15 mL Oral Daily  . nystatin  5 mL Oral QID  . pantoprazole  40 mg Oral Daily  . potassium chloride SA  40 mEq Oral Daily  . rivaroxaban  20 mg Oral Q supper  . thrombin  20,000 Units Topical Once   Continuous Infusions: . sodium chloride Stopped (06/28/18 2353)  . dextrose 50 mL/hr at 07/07/18 0502  . piperacillin-tazobactam (ZOSYN)  IV 3.375 g (07/07/18 0755)    Principal Problem:   Sepsis ([place]) Active Problems:   Essential hypertension   Hypokalemia   Pancreatic cancer metastasized to liver [facility])   Asymptomatic bacteriuria   Peritoneal carcinomatosis ([place])   Subcutaneous emphysema ([facility])   Constipation   Positive blood cultures   Advanced care planning/counseling discussion   Palliative care by specialist   Pain of metastatic malignancy   LOS: 9 days

## 2018-07-07 NOTE — Progress Notes (Signed)
Daily Progress Note   Patient Name: Lauren Richard       Date: 07/07/2018 DOB: Dec 28, 1935  Age: 82 y.o. MRN#: [id number] Attending Physician: Nita Sells, MD Primary Care Physician: Glendale Chard, MD Admit Date: 06/27/2018  Reason for Consultation/Follow-up: Establishing goals of care  Subjective: Patient arouses to her name. She does not answer me verbally, but she is interactive with family members, identifying them by name and nicknames. When providing mouthcare she is noted to have pain and white coating on tongue.  Continued GOC discussion with niece and Irven Coe. She notes discussion with Dr. Verlon Au and understands her Elenor Legato is rapidly declining.  We again discussed continued life prolonging medical care with antibiotics and IV fluids vs comfort care and Hospice.  Nevin Bloodgood stated their desire if to continue IV fluids and antibiotics until Sunday. There is a family funeral tomorrow and the entire family is coming to town. They desire to proceed through tomorrow with focus on their cousins funeral, and then to transition [place] to comfort care only measures on Sunday. They request referral for [facility] after that transition is made.  We discussed that if Juanita declines further, transfer to [facility] may not occur and patient may experience hospital death. Family is in agreement.   ROS  Length of Stay: 9  Current Medications: Scheduled Meds:  . sodium chloride   Intravenous Once  . acetaminophen  500 mg Oral Q12H  . chlorhexidine  15 mL Mouth Rinse BID  . Chlorhexidine Gluconate Cloth  6 each Topical Q0600  . feeding supplement  1 Container Oral Q24H  . feeding supplement (ENSURE ENLIVE)  237 mL Oral Q24H  . lidocaine  1 patch Transdermal Q12H  . magnesium oxide   400 mg Oral Daily  . mouth rinse  15 mL Mouth Rinse q12n4p  . multivitamin  15 mL Oral Daily  . nystatin  5 mL Oral QID  . pantoprazole  40 mg Oral Daily  . potassium chloride SA  40 mEq Oral Daily  . rivaroxaban  20 mg Oral Q supper  . thrombin  20,000 Units Topical Once    Continuous Infusions: . dextrose 5 % and 0.9% NaCl 125 mL/hr at 07/07/18 1129  . piperacillin-tazobactam (ZOSYN)  IV      PRN Meds: acetaminophen, ondansetron **OR** ondansetron (ZOFRAN)  IV, oxyCODONE, sodium chloride flush  Physical Exam  Constitutional:  cachexia  HENT:  Dry oropharynx, white coating on tongue, pain with oral care  Cardiovascular:  tachycardic  Pulmonary/Chest: Effort normal.  Nursing note and vitals reviewed.           Vital Signs: BP (!) 119/50   Pulse (!) 114   Temp 98.2 F (36.8 C) (Axillary)   Resp (!) 24   Ht 5\' 7"  (1.702 m)   Wt 73.9 kg   SpO2 98%   BMI 25.52 kg/m  SpO2: SpO2: 98 % O2 Device: O2 Device: Nasal Cannula O2 Flow Rate: O2 Flow Rate (L/min): 1 L/min  Intake/output summary:   Intake/Output Summary (Last 24 hours) at 07/07/2018 1356 Last data filed at 07/07/2018 0800 Gross per 24 hour  Intake 374.87 ml  Output 350 ml  Net 24.87 ml   LBM: Last BM Date: 07/07/18(flexiseal) Baseline Weight: Weight: 68 kg Most recent weight: Weight: 73.9 kg       Palliative Assessment/Data: PPS: 20%      Patient Active Problem List   Diagnosis Date Noted  . Positive blood cultures 07/04/2018  . Advanced care planning/counseling discussion   . Palliative care by specialist   . Pain of metastatic malignancy   . Sepsis ([place]) 06/28/2018  . Asymptomatic bacteriuria 06/28/2018  . Peritoneal carcinomatosis ([place]) 06/28/2018  . Subcutaneous emphysema ([place]) 06/28/2018  . Constipation 06/28/2018  . Tachycardia 06/05/2018  . Elevated troponin 06/05/2018  . Dyspnea 06/05/2018  . Abnormal liver function 06/05/2018  . Genetic testing 05/05/2018  . Malnutrition of  moderate degree 04/25/2018  . Malignant neoplasm of head of pancreas ([place])   . Heme positive stool   . Syncope 04/23/2018  . Syncope, near 04/22/2018  . Pancreatic cancer metastasized to liver ([place]) 04/22/2018  . Generalized weakness 03/29/2018  . Nausea & vomiting 03/29/2018  . Diarrhea 03/29/2018  . Chemotherapy induced neutropenia ([place]) 03/23/2018  . Family history of pancreatic cancer   . Family history of breast cancer   . Family history of kidney cancer   . Family history of prostate cancer   . Goals of care, counseling/discussion 03/09/2018  . Acute pulmonary embolism ([place]) 03/09/2018  . S/P laparoscopy 01/05/2018  . Adenocarcinoma of head of pancreas ([place]) 11/24/2017  . Acute kidney injury superimposed on chronic kidney disease ([place])   . Hypokalemia   . Anemia   . Biliary obstruction   . Acute kidney injury ([place]) 11/03/2017  . Pancreatic mass 11/03/2017  . Osteoarthritis of left knee 04/26/2017  . S/P total knee replacement using cement, left 04/26/2017  . Essential hypertension 03/03/2017  . Hyperlipidemia 03/03/2017  . Osteoarthritis 03/03/2017  . Obesity 03/03/2017  . Status post left foot surgery 12/03/2015  . Hammertoe 11/03/2015    Palliative Care Assessment & Plan   Patient Profile: 82 y.o.femalewith past medical history of pancreatic cancer (diagnosed 10/2017- s/p whipple, tx with Gemzar, abraxane, and xeloda- xeloda was held 8/13 due to increasing liver enzymes- plan was to recheck and possibly resume if liver enzymes decreased- she has had several hospital admissions since and has not returned for Oncology followup since 8/13)- other history includes HLD, HTN, arthritis, anemiaadmitted on 9/17/2019with abdominal pain, sepsis.CT scan showed progression of liver metastasis, peritoneal carcinomatosis, mesentery mass, and subcutaneous emphysema in R arm (for which she underwent emergent I&D for possible infectious etiology- cultures have since grown negative).  Urine culture was positive for enterobacter cloacae, blood culture was positive for staph- but per infectious  disease note- they question if this is contaminant and thus her septic etiology is uncertain. She continues to be treated vancomycin. Vancomycin stopped on 9/25 d/t determining blood cultures were positive d/t contaminants. She is getting blood tranfusions for anemia. She is full code status. On admission her albumin was 1.2 and she is noted to be cachetic. Palliative care consulted for [place].   Assessment/Recommendations/Plan   Continue with current care, no escalation  IV diflucan dosing per pharmacy for oral candidiasis for comfort  Will switch Oxy IR pill to oxy concentrated solution sublingual due to patient not taking po pills  Plan is to transition to full comfort care only on Sunday- d/c IV fluids, d/c antibiotics- request bed at residential Hospice- after family has time to gather and experience their other family member's funeral tomorrow  PMT will f/u with niece- Nevin Bloodgood on Sunday  Goals of Care and Additional Recommendations:  Limitations on Scope of Treatment: Minimize Medications and No Artificial Feeding  Code Status:  DNR  Prognosis:   < 2 weeks d/t advancing pancreatic cancer, very limited po intake, pneumonia, sepsis, planned transition to comfort measures only on Sunday  Discharge Planning:  Hospice facility on Sunday  Care plan was discussed with patient's HCPOA- Nevin Bloodgood  Thank you for allowing the Palliative Medicine Team to assist in the care of this patient.   Time In: 1325 Time Out: 1400 Total Time 35 minutes Prolonged Time Billed no      Greater than 50%  of this time was spent counseling and coordinating care related to the above assessment and plan.  Mariana Kaufman, AGNP-C Palliative Medicine   Please contact Palliative Medicine Team phone at [phone] for questions and concerns.

## 2018-07-07 NOTE — Progress Notes (Addendum)
Pharmacy Antibiotic Note  Lauren Richard is a 82 y.o. female admitted on 06/27/2018 with zosyn.  Pharmacy has been consulted for aspiration pneumonia dosing. Pharmacy is also consulted to dose fluconazole for oropharyngeal canidiasis  Plan: Change Zosyn to 2.25 gr IV q6h due to worsening renal function Fluconazole 200 mg IV x1, then 100 mg IV q24h  Follow renal function and clinical course  Height: 5\' 7"  (170.2 cm) Weight: 162 lb 14.7 oz (73.9 kg) IBW/kg (Calculated) : 61.6  Temp (24hrs), Avg:98.6 F (37 C), Min:98.2 F (36.8 C), Max:99 F (37.2 C)  Recent Labs  Lab 07/01/18 1800 07/01/18 2054 07/02/18 0500 07/03/18 0531 07/04/18 0543 07/05/18 0500 07/06/18 0330 07/07/18 0307  WBC  --   --   --  12.8* 13.3* 15.0* 18.9* 17.7*  CREATININE  --   --  1.33* 1.54* 1.50* 1.53*  --  2.71*  VANCOTROUGH 19  --   --  23*  --   --   --   --   VANCOPEAK  --  40  --   --   --   --   --   --     Estimated Creatinine Clearance: 16.1 mL/min (A) (by C-G formula based on SCr of 2.71 mg/dL (H)).    No Known Allergies   Thank you for allowing pharmacy to be a part of this patient's care.  Royetta Asal, PharmD, BCPS Pager [phone] 07/07/2018 1:25 PM

## 2018-07-07 NOTE — Care Management Note (Signed)
Case Management Note  Patient Details  Name: RISE TRAEGER MRN: [id number] Date of Birth: 1936-04-21  Subjective/Objective:                  o2 [place],d10 @50cc Serita Grit zosyn, wbc=17.7  Action/Plan: Will follow for progression of care and clinical status. Will follow for case management needs none present at this time.  Expected Discharge Date:  (unknown)               Expected Discharge Plan:  Skilled Nursing Facility  In-House Referral:  Clinical Social Work  Discharge planning Services  CM Consult  Post Acute Care Choice:    Choice offered to:     DME Arranged:    DME Agency:     HH Arranged:    [place] Agency:     Status of Service:  In process, will continue to follow  If discussed at Long Length of Stay Meetings, dates discussed:    Additional Comments:  Leeroy Cha, RN 07/07/2018, 8:35 AM

## 2018-07-07 NOTE — Progress Notes (Signed)
CSW informed pt's family requesting hospice care- residential hospice needed. Left voicemail for niece Nevin Bloodgood [phone] to discuss hospice facility options.   Sharren Bridge, MSW, LCSW Clinical Social Work 07/07/2018 [phone]

## 2018-07-08 LAB — CBC WITH DIFFERENTIAL/PLATELET
Basophils Absolute: 0 10*3/uL (ref 0.0–0.1)
Basophils Relative: 0 %
EOS PCT: 1 %
Eosinophils Absolute: 0.1 10*3/uL (ref 0.0–0.7)
HCT: 25.8 % — ABNORMAL LOW (ref 36.0–46.0)
Hemoglobin: 8.9 g/dL — ABNORMAL LOW (ref 12.0–15.0)
LYMPHS ABS: 1.5 10*3/uL (ref 0.7–4.0)
LYMPHS PCT: 9 %
MCH: 31.2 pg (ref 26.0–34.0)
MCHC: 34.5 g/dL (ref 30.0–36.0)
MCV: 90.5 fL (ref 78.0–100.0)
MONO ABS: 1.6 10*3/uL — AB (ref 0.1–1.0)
Monocytes Relative: 10 %
Neutro Abs: 13 10*3/uL — ABNORMAL HIGH (ref 1.7–7.7)
Neutrophils Relative %: 80 %
PLATELETS: 167 10*3/uL (ref 150–400)
RBC: 2.85 MIL/uL — ABNORMAL LOW (ref 3.87–5.11)
RDW: 16.8 % — AB (ref 11.5–15.5)
WBC: 16.2 10*3/uL — ABNORMAL HIGH (ref 4.0–10.5)

## 2018-07-08 LAB — CULTURE, BLOOD (ROUTINE X 2)
CULTURE: NO GROWTH
CULTURE: NO GROWTH
Special Requests: ADEQUATE
Special Requests: ADEQUATE

## 2018-07-08 LAB — COMPREHENSIVE METABOLIC PANEL
ALT: 24 U/L (ref 0–44)
AST: 186 U/L — ABNORMAL HIGH (ref 15–41)
Albumin: 1.9 g/dL — ABNORMAL LOW (ref 3.5–5.0)
Alkaline Phosphatase: 426 U/L — ABNORMAL HIGH (ref 38–126)
Anion gap: 5 (ref 5–15)
BUN: 13 mg/dL (ref 8–23)
CHLORIDE: 125 mmol/L — AB (ref 98–111)
CO2: 13 mmol/L — ABNORMAL LOW (ref 22–32)
CREATININE: 3.37 mg/dL — AB (ref 0.44–1.00)
Calcium: 8.2 mg/dL — ABNORMAL LOW (ref 8.9–10.3)
GFR, EST AFRICAN AMERICAN: 14 mL/min — AB (ref >60)
GFR, EST NON AFRICAN AMERICAN: 12 mL/min — AB (ref >60)
Glucose, Bld: 105 mg/dL — ABNORMAL HIGH (ref 70–99)
POTASSIUM: 4.4 mmol/L (ref 3.5–5.1)
Sodium: 143 mmol/L (ref 135–145)
Total Bilirubin: 2.3 mg/dL — ABNORMAL HIGH (ref 0.3–1.2)
Total Protein: 4.9 g/dL — ABNORMAL LOW (ref 6.5–8.1)

## 2018-07-08 LAB — GLUCOSE, CAPILLARY
GLUCOSE-CAPILLARY: 86 mg/dL (ref 70–99)
Glucose-Capillary: 77 mg/dL (ref 70–99)
Glucose-Capillary: 82 mg/dL (ref 70–99)
Glucose-Capillary: 92 mg/dL (ref 70–99)
Glucose-Capillary: 92 mg/dL (ref 70–99)
Glucose-Capillary: 98 mg/dL (ref 70–99)

## 2018-07-08 MED ORDER — FLUCONAZOLE 100MG IVPB
50.0000 mg | INTRAVENOUS | Status: DC
  Administered 2018-07-08 – 2018-07-09 (×2): 50 mg via INTRAVENOUS
  Filled 2018-07-08 (×2): qty 25

## 2018-07-08 MED ORDER — FUROSEMIDE 10 MG/ML IJ SOLN
20.0000 mg | Freq: Once | INTRAMUSCULAR | Status: AC
  Administered 2018-07-08: 20 mg via INTRAVENOUS
  Filled 2018-07-08: qty 2

## 2018-07-08 MED ORDER — ENOXAPARIN SODIUM 40 MG/0.4ML ~~LOC~~ SOLN
40.0000 mg | SUBCUTANEOUS | Status: DC
  Administered 2018-07-08: 40 mg via SUBCUTANEOUS
  Filled 2018-07-08: qty 0.4

## 2018-07-08 NOTE — Progress Notes (Signed)
Pharmacy Antibiotic Note  Lauren Richard is a 82 y.o. female admitted on 06/27/2018 with zosyn.  Pharmacy has been consulted for aspiration pneumonia dosing. Pharmacy is also consulted to dose fluconazole for oropharyngeal canidiasis  Plan: Continue Zosyn 2.25 gr IV q6h due to worsening renal function Fluconazole 200 mg IV x1, then reduce Fluconazole dose further to 50mg  IV q24h  Follow renal function and clinical course  Xarelto 20mg  daily for hx PE, changed to Lovenox 40mg  SQ q24 for tx dose (0.5mg /kg/24hr)  Height: 5\' 7"  (170.2 cm) Weight: 162 lb 14.7 oz (73.9 kg) IBW/kg (Calculated) : 61.6  Temp (24hrs), Avg:99 F (37.2 C), Min:98.2 F (36.8 C), Max:99.8 F (37.7 C)  Recent Labs  Lab 07/01/18 1800 07/01/18 2054  07/03/18 0531 07/04/18 0543 07/05/18 0500 07/06/18 0330 07/07/18 0307 07/08/18 0443  WBC  --   --    < > 12.8* 13.3* 15.0* 18.9* 17.7* 16.2*  CREATININE  --   --    < > 1.54* 1.50* 1.53*  --  2.71* 3.37*  VANCOTROUGH 19  --   --  23*  --   --   --   --   --   VANCOPEAK  --  40  --   --   --   --   --   --   --    < > = values in this interval not displayed.    Estimated Creatinine Clearance: 12.9 mL/min (A) (by C-G formula based on SCr of 3.37 mg/dL (H)).    No Known Allergies   Antimicrobials this admission:  9/17 Clindamycin x1 dose 9/17 Cefepime >> 9/19 9/17 Metronidazole >> 9/19 9/17 Vancomycin >> 9/25 9/19 Ceftriaxone >> 9/20 9/20 Meropenem >> 9/24 9/26 Zosyn >>  9/27 fluconazole >>  Dose adjustments this admission:  9/23 vancomycin 1250 mg q36h>1000 mg q 48h 9/27 Zosyn 3.375 q8h to 2.25 gr IV q6h due to worsening CrCl  Microbiology results:  9/17 BCx: 2/2 sets, 2/4 bottles Staph Epidermidis (sens: tetracycline, vanc, clinda)     9/18 BCID:  MR staph species (CoNS) 9/17 UCx: >100k Enterobacter Cloacae (sens: cipro, gent, imi, bactrim)  9/18 Right arm tissue (I&D): no org seen, cxt pending 9/18 Right knee synovial fluid: no org seen, cxt  pending 9/18 MRSA PCR: negative 9/23 BCx (repeat): ngtd  Thank you for allowing pharmacy to be a part of this patient's care.  Minda Ditto PharmD Pager [phone] 07/08/2018, 8:16 AM

## 2018-07-08 NOTE — Progress Notes (Signed)
[facility])  Received request from [place] for family interest in [facility]. Chart reviewed and will follow up with CSW and family later this afternoon.   Thank you, Erling Conte, LCSW  [phone]

## 2018-07-08 NOTE — Progress Notes (Signed)
[facility] PROGRESS NOTE  Lauren Richard MWU:[id number] DOB: 1935-11-02 DOA: 06/27/2018 PCP: Glendale Chard, MD   Narrative: 82 year old female Pancreatic cancer stage Ib (C T2, cN0, CM0)-path stage III diagnosed 10/2007 status post Whipple-Gemzar, Abraxane, Xeloda under care of Dr. Krista Blue fang Pulmonary embolism 02/2018 A. fib prior on Xarelto HTN Hypothyroid Recent admission 8/26-9/1 and ICM drop in EF 55-25%?  Takotsubo-medications were adjusted cardiology saw the patient  Admitted 9/18 with nausea vomiting generalized abdominal pain fever 104 initially hypotensive 86/60 WBC 15.7 T-max in ED 102 CT abdomen pelvis showed moderate stool stercoral colitis advancing metastatic disease and peritoneal carcinomatosis?  Subcu air  Eventually found to have probable Enterobacter infection-blood culture grew MRSA coag negative contaminant repeat blood cultures were done ID saw the patient patient was treated with meropenem Hospitalization complicated by AKI metabolic encephalopathy-discussions were carried out with family and patient and it was ultimately decided on 927 to pursue comfort care and freestanding hospice placement  A & Plan  AKI-significant bump in creatinine-change fluids to D5/NS 125 cc/H-have to balance with volume overload-she has worsening edema and I will cut back fluids  Sepsis unclear cause-defer to ID-vancomycin discontinued 9/25 -926 treating for aspiration Zosyn-continue till 929  Moderate to severe hypoglycemia-CBG 67/70-patient not really eating-stop sugar checks  Adenocarcinoma head of pancreas-off Xeloda-Dr. Annamaria Boots cc'd - palliative care is aware and delineating goals of care  Prior pulmonary embolism 02/2018-continuing Xarelto we will discontinue the same 7/25  Acute metabolic encephalopathy factorial-AKI-infectious  HTN  Complicated acid-base disturbance secondary to infection cancer and alkalosis  Adult failure to thrive, cancer cachexia-   DVT  prophylaxis: Xarelto  Code Status: Full but might be moving to comfort   Family Communication: d/w niece in detail on 9/26 disposition Plan: inpatient --- patient will go to freestanding hospice 9/29 if bed is available-social worker will be made aware-she has stabilized and is not actively dying at this stage and this process can be carried out at facility   Verlon Au, MD  [facility] Direct contact: [phone] --Via [employer]  --[email or web address]; password [email or web address]  7PM-7AM contact night coverage as above 07/08/2018, 11:12 AM  LOS: 10 days   Consultants:  Oncology  Palliative  Procedures:  multiple  Antimicrobials:  Vancomycin  Zosyn currently since 9/26  Interval history/Subjective:  awakens but not completely coherent No fever no chills Sugars are in the 80s to 100s  Objective:  Vitals:  Vitals:   07/07/18 2104 07/08/18 0606  BP: 113/66 (!) 120/59  Pulse: (!) 109 (!) 109  Resp: 17 16  Temp: 99.8 F (37.7 C) 98.9 F (37.2 C)  SpO2: 99% 100%    Exam:  . listless bitemporal wasting . cta b no rales no rhonchi . s1 s2 no m/r/g    I have personally reviewed the following:   Labs:  BUN/creatinine up from 13-3.3 chloride 125 abdomen 1.9 total bili 2.3 AST 186 ALT 24  WBC 16  Hemoglobin 8  Imaging studies:  n  Medical tests:   n   Test discussed with performing physician:  n  Decision to obtain old records:  n  Review and summation of old records:  n  Scheduled Meds: . sodium chloride   Intravenous Once  . acetaminophen  500 mg Oral Q12H  . chlorhexidine  15 mL Mouth Rinse BID  . Chlorhexidine Gluconate Cloth  6 each Topical Q0600  . enoxaparin (LOVENOX) injection  40 mg Subcutaneous Q24H  . feeding supplement  1 Container Oral Q24H  .  feeding supplement (ENSURE ENLIVE)  237 mL Oral Q24H  . lidocaine  1 patch Transdermal Q12H  . magnesium oxide  400 mg Oral Daily  . mouth rinse  15 mL Mouth Rinse q12n4p  . multivitamin  15  mL Oral Daily  . nystatin  5 mL Oral QID  . pantoprazole  40 mg Oral Daily  . potassium chloride SA  40 mEq Oral Daily  . thrombin  20,000 Units Topical Once   Continuous Infusions: . dextrose 5 % and 0.9% NaCl 125 mL/hr at 07/08/18 0442  . fluconazole (DIFLUCAN) IV    . piperacillin-tazobactam (ZOSYN)  IV 2.25 g (07/08/18 0910)    Principal Problem:   Sepsis ([place]) Active Problems:   Essential hypertension   Hypokalemia   Pancreatic cancer metastasized to liver [facility])   Asymptomatic bacteriuria   Peritoneal carcinomatosis ([place])   Subcutaneous emphysema ([facility])   Constipation   Positive blood cultures   Advanced care planning/counseling discussion   Palliative care by specialist   Pain of metastatic malignancy   Oral candidiasis   LOS: 10 days

## 2018-07-08 NOTE — Progress Notes (Signed)
CSW spoke with patient's niece, Nevin Bloodgood, regarding choice for residential hospice who stated choice of [facility].   CSW made referral to hospice liaison, Erling Conte.    Will continue to follow for discharge coordination.    Pricilla Holm, MSW, [place] Social Work [phone]

## 2018-07-09 LAB — GLUCOSE, CAPILLARY
GLUCOSE-CAPILLARY: 77 mg/dL (ref 70–99)
GLUCOSE-CAPILLARY: 83 mg/dL (ref 70–99)
Glucose-Capillary: 82 mg/dL (ref 70–99)

## 2018-07-09 NOTE — Progress Notes (Signed)
No oral medications given to patient, patient is too lethargic to safely swallow and patient says no. Will continue to monitor.

## 2018-07-09 NOTE — Discharge Summary (Signed)
Physician Discharge Summary  Lauren Richard WUJ:[id number] DOB: January 31, 1936 DOA: 06/27/2018  PCP: Glendale Chard, MD  Admit date: 06/27/2018 Discharge date: 07/09/2018  Time spent: 20 minutes  Recommendations for Outpatient Follow-up:  1. EOL care at [facility] 2. Keep IV and Flexiseal 3. Comfort trajectory expected  Discharge Diagnoses:  Principal Problem:   Sepsis ([place]) Active Problems:   Essential hypertension   Hypokalemia   Pancreatic cancer metastasized to liver [facility])   Asymptomatic bacteriuria   Peritoneal carcinomatosis (HCC)   Subcutaneous emphysema (HCC)   Constipation   Positive blood cultures   Advanced care planning/counseling discussion   Palliative care by specialist   Pain of metastatic malignancy   Oral candidiasis   Discharge Condition: guarded 2-4 days  Diet recommendation: comfort  Filed Weights   06/27/18 1415 06/30/18 0448  Weight: 68 kg 73.9 kg    History of present illness:  82 year old female Pancreatic cancer stage Ib (C T2, cN0, CM0)-path stage III diagnosed 10/2007 status post Whipple-Gemzar, Abraxane, Xeloda under care of Dr. Krista Blue fang Pulmonary embolism 02/2018 A. fib prior on Xarelto HTN Hypothyroid Recent admission 8/26-9/1 and ICM drop in EF 55-25%?  Takotsubo-medications were adjusted cardiology saw the patient   Admitted 9/18 with nausea vomiting generalized abdominal pain fever 104 initially hypotensive 86/60 WBC 15.7 T-max in ED 102 CT abdomen pelvis showed moderate stool stercoral colitis advancing metastatic disease and peritoneal carcinomatosis?  Subcu air   Eventually found to have probable Enterobacter infection-blood culture grew MRSA coag negative contaminant repeat blood cultures were done ID saw the patient patient was treated with meropenem Hospitalization complicated by AKI metabolic encephalopathy-discussions were carried out with family and patient and it was ultimately decided on 927 to pursue comfort care and  freestanding hospice placement  Long discussions with family and all q's answered   Discharge Exam: Vitals:   07/08/18 2105 07/09/18 0602  BP: (!) 102/58 (!) 112/54  Pulse: 97 89  Resp: 18 17  Temp: 98.3 F (36.8 C) 97.7 F (36.5 C)  SpO2: 100% 100%    General: sleepy but rousable? Some R facial droop Cardiovascular:  s1 s 2no m Respiratory: cta b Swollen and edema  Discharge Instructions    Allergies as of 07/09/2018   No Known Allergies     Medication List    STOP taking these medications   amoxicillin-clavulanate 500-125 MG tablet Commonly known as:  AUGMENTIN   capecitabine 500 MG tablet Commonly known as:  XELODA   clopidogrel 75 MG tablet Commonly known as:  PLAVIX   gabapentin 100 MG capsule Commonly known as:  NEURONTIN   magnesium oxide 400 MG tablet Commonly known as:  MAG-OX   mirtazapine 7.5 MG tablet Commonly known as:  REMERON   nystatin 100000 UNIT/ML suspension Commonly known as:  MYCOSTATIN   pantoprazole 40 MG tablet Commonly known as:  PROTONIX   potassium chloride SA 20 MEQ tablet Commonly known as:  K-DUR,KLOR-CON   prochlorperazine 10 MG tablet Commonly known as:  COMPAZINE   rivaroxaban 20 MG Tabs tablet Commonly known as:  XARELTO   rosuvastatin 20 MG tablet Commonly known as:  CRESTOR   traMADol 50 MG tablet Commonly known as:  ULTRAM     TAKE these medications   acetaminophen 500 MG tablet Commonly known as:  TYLENOL Take 500 mg by mouth every 12 (twelve) hours.   ASPERCREME LIDOCAINE 4 % Ptch Generic drug:  Lidocaine Apply 1 patch topically every 12 (twelve) hours. Lower back   colchicine  0.6 MG tablet Take 0.6 mg by mouth once.   docusate sodium 100 MG capsule Commonly known as:  COLACE Take 100 mg by mouth daily. Hold for loose stool   feeding supplement (ENSURE ENLIVE) Liqd Take 237 mLs by mouth 3 (three) times daily between meals.   lidocaine-prilocaine cream Commonly known as:  EMLA Apply 1  application topically as needed.   metoprolol tartrate 25 MG tablet Commonly known as:  LOPRESSOR Take 0.5 tablets (12.5 mg total) by mouth 2 (two) times daily.   oxyCODONE 5 MG immediate release tablet Commonly known as:  Oxy IR/ROXICODONE Take 1-2 tablets (5-10 mg total) by mouth every 6 (six) hours as needed for severe pain.   trolamine salicylate 10 % cream Commonly known as:  ASPERCREME Apply 1 application topically 4 (four) times daily as needed (for knee pain/muscle pain.).      No Known Allergies    The results of significant diagnostics from this hospitalization (including imaging, microbiology, ancillary and laboratory) are listed below for reference.    Significant Diagnostic Studies: Ct Angio Head W Or Wo Contrast  Result Date: 06/21/2018 CLINICAL DATA:  Left facial droop, slurred speech. History of pancreatic cancer. Suspect cerebral hemorrhage. EXAM: CT ANGIOGRAPHY HEAD AND NECK TECHNIQUE: Multidetector CT imaging of the head and neck was performed using the standard protocol during bolus administration of intravenous contrast. Multiplanar CT image reconstructions and MIPs were obtained to evaluate the vascular anatomy. Carotid stenosis measurements (when applicable) are obtained utilizing NASCET criteria, using the distal internal carotid diameter as the denominator. CONTRAST:  55mL ISOVUE-370 IOPAMIDOL (ISOVUE-370) INJECTION 76% COMPARISON:  CT head 06/21/2018 FINDINGS: CTA NECK FINDINGS Aortic arch: Atherosclerotic calcification aortic arch and proximal great vessels. Negative for aneurysm or stenosis. Right carotid system: Mild atherosclerotic calcification right carotid bifurcation without significant stenosis. Left carotid system: Mild atherosclerotic disease left carotid bifurcation without significant stenosis. Vertebral arteries: Both vertebral arteries are patent to the basilar. Moderate stenosis distal right vertebral artery. Skeleton: Cervical spondylosis.  No acute  skeletal abnormality. Other neck: Thyroid goiter. Negative for mass or adenopathy in the neck. Upper chest: Negative Review of the MIP images confirms the above findings CTA HEAD FINDINGS Anterior circulation: Moderate stenosis left cavernous carotid due to atherosclerotic calcification. Mild calcification right cavernous carotid without stenosis. Anterior and middle cerebral arteries patent bilaterally without stenosis or aneurysm. Posterior circulation: Moderately severe stenosis distal right vertebral artery at the level the dura due to calcific plaque. Mild stenosis distal left vertebral artery. PICA patent bilaterally. Basilar widely patent. Superior cerebellar and posterior cerebral arteries patent bilaterally without stenosis or aneurysm. Venous sinuses: Not significantly opacified due to timing of the scan. Anatomic variants: None Delayed phase: Not performed Review of the MIP images confirms the above findings IMPRESSION: Mild atherosclerotic disease of the carotid bifurcation bilaterally without significant stenosis Moderate stenosis distal right vertebral artery, mild stenosis distal left vertebral artery. Negative for emergent large vessel occlusion. Electronically Signed   By: Franchot Gallo M.D.   On: 06/21/2018 14:06   Dg Chest 2 View  Result Date: 07/06/2018 CLINICAL DATA:  Pancreatic cancer. EXAM: CHEST - 2 VIEW COMPARISON:  07/04/2018. FINDINGS: PowerPort catheter noted with lead tip over superior vena cava. Cardiomegaly with diffuse bilateral mild pulmonary infiltrates/edema and bilateral pleural effusions. Low lung volumes. No acute bony abnormality. IMPRESSION: 1.  PowerPort catheter with tip over superior vena cava. 2. Cardiomegaly with diffuse bilateral mild bilateral pulmonary infiltrates and bilateral pleural effusions suggesting pulmonary edema. Bilateral pneumonia cannot be  excluded. Electronically Signed   By: Marcello Moores  Register   On: 07/06/2018 10:07   Ct Head Wo Contrast  Result  Date: 06/29/2018 CLINICAL DATA:  Generalized weakness and confusion EXAM: CT HEAD WITHOUT CONTRAST TECHNIQUE: Contiguous axial images were obtained from the base of the skull through the vertex without intravenous contrast. COMPARISON:  06/21/2018 FINDINGS: Brain: No evidence of acute infarction, hemorrhage, hydrocephalus, extra-axial collection or mass lesion/mass effect. Vascular: No hyperdense vessel or unexpected calcification. Skull: Normal. Negative for fracture or focal lesion. Sinuses/Orbits: No acute finding. Other: None. IMPRESSION: No acute intracranial abnormality noted. Electronically Signed   By: Inez Catalina M.D.   On: 06/29/2018 10:44   Ct Angio Neck W Or Wo Contrast  Result Date: 06/21/2018 CLINICAL DATA:  Left facial droop, slurred speech. History of pancreatic cancer. Suspect cerebral hemorrhage. EXAM: CT ANGIOGRAPHY HEAD AND NECK TECHNIQUE: Multidetector CT imaging of the head and neck was performed using the standard protocol during bolus administration of intravenous contrast. Multiplanar CT image reconstructions and MIPs were obtained to evaluate the vascular anatomy. Carotid stenosis measurements (when applicable) are obtained utilizing NASCET criteria, using the distal internal carotid diameter as the denominator. CONTRAST:  62mL ISOVUE-370 IOPAMIDOL (ISOVUE-370) INJECTION 76% COMPARISON:  CT head 06/21/2018 FINDINGS: CTA NECK FINDINGS Aortic arch: Atherosclerotic calcification aortic arch and proximal great vessels. Negative for aneurysm or stenosis. Right carotid system: Mild atherosclerotic calcification right carotid bifurcation without significant stenosis. Left carotid system: Mild atherosclerotic disease left carotid bifurcation without significant stenosis. Vertebral arteries: Both vertebral arteries are patent to the basilar. Moderate stenosis distal right vertebral artery. Skeleton: Cervical spondylosis.  No acute skeletal abnormality. Other neck: Thyroid goiter. Negative for  mass or adenopathy in the neck. Upper chest: Negative Review of the MIP images confirms the above findings CTA HEAD FINDINGS Anterior circulation: Moderate stenosis left cavernous carotid due to atherosclerotic calcification. Mild calcification right cavernous carotid without stenosis. Anterior and middle cerebral arteries patent bilaterally without stenosis or aneurysm. Posterior circulation: Moderately severe stenosis distal right vertebral artery at the level the dura due to calcific plaque. Mild stenosis distal left vertebral artery. PICA patent bilaterally. Basilar widely patent. Superior cerebellar and posterior cerebral arteries patent bilaterally without stenosis or aneurysm. Venous sinuses: Not significantly opacified due to timing of the scan. Anatomic variants: None Delayed phase: Not performed Review of the MIP images confirms the above findings IMPRESSION: Mild atherosclerotic disease of the carotid bifurcation bilaterally without significant stenosis Moderate stenosis distal right vertebral artery, mild stenosis distal left vertebral artery. Negative for emergent large vessel occlusion. Electronically Signed   By: Franchot Gallo M.D.   On: 06/21/2018 14:06   Ct Humerus Right Wo Contrast  Result Date: 06/27/2018 CLINICAL DATA:  Swelling mid humerus at a former IV site. Gas and fluid was seen in the arm on the CT abdomen and pelvis. EXAM: CT OF THE RIGHT FOREARM WITHOUT CONTRAST; CT OF THE RIGHT HUMERUS WITHOUT CONTRAST TECHNIQUE: Multidetector CT imaging was performed according to the standard protocol. Multiplanar CT image reconstructions were also generated. COMPARISON:  CT abdomen and pelvis 06/27/2018 FINDINGS: Bones/Joint/Cartilage Degenerative changes in the right shoulder. Prominent olecranon spur. Degenerative changes in the radiocarpal joints. No evidence of acute fracture or dislocation or focal bone lesion involving the humerus, radius, or ulna. Would Ligaments Suboptimally assessed by  CT. Muscles and Tendons Diffuse muscular atrophy demonstrated in the right arm. The biceps muscle and sheath appear expanded with poor definition of the intermuscular fat layers. This may be due to infiltration,  edema, or hematoma. Soft tissues There is soft tissue swelling, infiltration, and gas present in the anterior aspect of the right arm involving the anterior muscle compartments. There is diffuse infiltration throughout the subcutaneous fat of the right arm mostly anteriorly but also extending posteriorly. Soft tissue infiltration and gas extend from the subcoracoid region down to the level of the elbow. Infiltration of the subcutaneous fat extends inferiorly throughout the right forearm. Muscle compartments of the right forearm appear intact. Changes could represent edema, cellulitis, abscess, or hematoma or combination of these things. IMPRESSION: 1. Degenerative changes in the right shoulder and radiocarpal joints. No evidence of acute fracture or dislocation. 2. Diffuse soft tissue swelling, infiltration, and gas in the anterior aspect of the right arm involving the anterior muscle compartments and surrounding subcutaneous soft tissues. Changes could represent edema, cellulitis, abscess, or combination of these findings. Electronically Signed   By: Lucienne Capers M.D.   On: 06/27/2018 21:19   Mr Brain Wo Contrast (neuro Protocol)  Result Date: 06/21/2018 CLINICAL DATA:  Right-sided weakness beginning today. Facial droop. Atrial fibrillation. EXAM: MRI HEAD WITHOUT CONTRAST TECHNIQUE: Multiplanar, multiecho pulse sequences of the brain and surrounding structures were obtained without intravenous contrast. COMPARISON:  CT same day FINDINGS: Brain: The examination suffers from motion degradation. Diffusion imaging does not show any acute or subacute infarction. The brainstem and cerebellum are normal. Cerebral hemispheres show mild age related atrophy. No sign of old infarction. No mass lesion,  hemorrhage, hydrocephalus or extra-axial collection. Vascular: Major vessels at the base of the brain show flow. Skull and upper cervical spine: Negative Sinuses/Orbits: Clear/unremarkable Other: None IMPRESSION: Motion degraded study. No acute or significant finding. Mild age related atrophy. Electronically Signed   By: Nelson Chimes M.D.   On: 06/21/2018 15:47   Ct Abdomen Pelvis W Contrast  Result Date: 06/27/2018 CLINICAL DATA:  Abdominal pain. Fever. Pancreatic cancer status post Whipple procedure with liver metastases. EXAM: CT ABDOMEN AND PELVIS WITH CONTRAST TECHNIQUE: Multidetector CT imaging of the abdomen and pelvis was performed using the standard protocol following bolus administration of intravenous contrast. CONTRAST:  171mL ISOVUE-300 IOPAMIDOL (ISOVUE-300) INJECTION 61% COMPARISON:  03/09/2018 CT abdomen/pelvis. FINDINGS: Lower chest: No significant pulmonary nodules or acute consolidative airspace disease. New mildly enlarged right pericardiophrenic nodes up to 1.0 cm (series 2/image 13). Hepatobiliary: Poorly marginated hyperenhancing confluent masses throughout the liver appear increased in size since 03/09/2018 CT. For example, a 2.4 cm anterior liver mass (series 2/image 16), increased from 0.8 cm. A 3.1 cm posterior right liver mass (series 2/image 26), increased from 1.2 cm. Background diffuse hepatic steatosis. Cholecystectomy. No intrahepatic biliary ductal dilatation status post biliary enteric anastomosis. Pancreas: Status post resection of the pancreatic head with pancreatic-enteric anastomosis. Stable position of pancreatic duct stent in the pancreatic body. No discrete mass in the remnant pancreas. Spleen: Normal size. No mass. Adrenals/Urinary Tract: No discrete adrenal nodules. Simple 1.5 cm posterior interpolar left renal cyst. No hydronephrosis. Symmetric delayed contrast nephrograms. Normal bladder. Stomach/Bowel: Status post resection of the distal stomach with  gastrojejunostomy. No acute gastric remnant abnormality. Normal caliber small bowel with no small bowel wall thickening. Appendix not discretely visualized. Moderate distention of the rectum by stool. Mild circumferential rectal wall thickening with perirectal fat stranding and mild fluid in the presacral space. No additional sites of large bowel wall thickening. Vascular/Lymphatic: Atherosclerotic nonaneurysmal abdominal aorta. Patent hepatic, portal, splenic and right renal veins. Narrowing/occlusion of the left renal vein anterior to the abdominal aorta, new. New soft  tissue in the central mesentery measuring up to 1.2 cm (series 2/image 40). New ill-defined left para-aortic soft tissue measuring up to 1.5 cm short axis (series 2/image 36). Reproductive: Status post hysterectomy, with no abnormal findings at the vaginal cuff. No adnexal mass. Other: No pneumoperitoneum. No focal fluid collection. No ascites. Extensive new nodularity throughout the omentum bilaterally, measuring up to 1.5 cm (series 2/image 43). New irregular thickening of the right pericolic gutter peritoneum (series 2/image 37). Musculoskeletal: No aggressive appearing focal osseous lesions. Moderate thoracolumbar spondylosis. Partial visualization of deep soft tissue fluid and gas throughout right upper extremity (series 2/image 5). IMPRESSION: 1. Progression of liver metastatic disease. 2. New peritoneal carcinomatosis with widespread omental metastases. 3. New nodular soft tissue in the central mesentery and left para-aortic regions, compatible with infiltrative adenopathy or locally recurrent tumor. 4. New mild right pericardiophrenic adenopathy compatible with metastatic disease. 5. Postsurgical changes from Whipple procedure, with no evidence of bowel obstruction. 6. Moderate rectal stool with mild circumferential rectal wall thickening and perirectal fat stranding and presacral space fluid, cannot exclude stercoral colitis. 7. Partial  visualization of extensive deep soft tissue fluid and gas in the right upper extremity, cannot exclude gangrene/necrotizing fasciitis. 8.  Aortic Atherosclerosis (ICD10-I70.0). These results were called by telephone at the time of interpretation on 06/27/2018 at 7:37 pm to Dr. Gilford Raid, who verbally acknowledged these results. Electronically Signed   By: Ilona Sorrel M.D.   On: 06/27/2018 19:37   Ct Forearm Right Wo Contrast  Result Date: 06/27/2018 CLINICAL DATA:  Swelling mid humerus at a former IV site. Gas and fluid was seen in the arm on the CT abdomen and pelvis. EXAM: CT OF THE RIGHT FOREARM WITHOUT CONTRAST; CT OF THE RIGHT HUMERUS WITHOUT CONTRAST TECHNIQUE: Multidetector CT imaging was performed according to the standard protocol. Multiplanar CT image reconstructions were also generated. COMPARISON:  CT abdomen and pelvis 06/27/2018 FINDINGS: Bones/Joint/Cartilage Degenerative changes in the right shoulder. Prominent olecranon spur. Degenerative changes in the radiocarpal joints. No evidence of acute fracture or dislocation or focal bone lesion involving the humerus, radius, or ulna. Would Ligaments Suboptimally assessed by CT. Muscles and Tendons Diffuse muscular atrophy demonstrated in the right arm. The biceps muscle and sheath appear expanded with poor definition of the intermuscular fat layers. This may be due to infiltration, edema, or hematoma. Soft tissues There is soft tissue swelling, infiltration, and gas present in the anterior aspect of the right arm involving the anterior muscle compartments. There is diffuse infiltration throughout the subcutaneous fat of the right arm mostly anteriorly but also extending posteriorly. Soft tissue infiltration and gas extend from the subcoracoid region down to the level of the elbow. Infiltration of the subcutaneous fat extends inferiorly throughout the right forearm. Muscle compartments of the right forearm appear intact. Changes could represent edema,  cellulitis, abscess, or hematoma or combination of these things. IMPRESSION: 1. Degenerative changes in the right shoulder and radiocarpal joints. No evidence of acute fracture or dislocation. 2. Diffuse soft tissue swelling, infiltration, and gas in the anterior aspect of the right arm involving the anterior muscle compartments and surrounding subcutaneous soft tissues. Changes could represent edema, cellulitis, abscess, or combination of these findings. Electronically Signed   By: Lucienne Capers M.D.   On: 06/27/2018 21:19   Dg Chest Port 1 View  Result Date: 07/04/2018 CLINICAL DATA:  Pancreatic cancer post Whipple procedure with metastases to liver, on chemotherapy, generalized weakness, abdominal pain, nausea, vomiting, constipation, sepsis, leukocytosis EXAM: PORTABLE CHEST 1  VIEW COMPARISON:  Portable exam 0727 hours compared to 06/27/2018 FINDINGS: LEFT subclavian Port-A-Cath unchanged. Stable heart size mediastinal contours for technique. Low lung volumes with increased bibasilar atelectasis. Questionable mild RIGHT perihilar infiltrate. Tiny LEFT pleural effusion. No pneumothorax. IMPRESSION: Increased bibasilar atelectasis with tiny LEFT pleural effusion and questionable mild RIGHT perihilar infiltrate. Electronically Signed   By: Lavonia Dana M.D.   On: 07/04/2018 08:19   Dg Chest Port 1 View  Result Date: 06/27/2018 CLINICAL DATA:  Abdominal pain, fever. History of atrial fibrillation, metastatic pancreatic malignancy. EXAM: PORTABLE CHEST 1 VIEW COMPARISON:  Chest x-ray of June 05, 2018 FINDINGS: The lungs are adequately inflated and clear. The heart and pulmonary vascularity are normal. The mediastinum is normal in width. There is calcification in the wall of the aortic arch. The porta catheter tip projects at the junction of the right and left brachiocephalic veins. The bony thorax is unremarkable. IMPRESSION: There is no active cardiopulmonary disease. Thoracic aortic atherosclerosis.  Electronically Signed   By: David  Martinique M.D.   On: 06/27/2018 14:55   Ct Head Code Stroke Wo Contrast  Addendum Date: 07/02/2018   ADDENDUM REPORT: 07/02/2018 14:02 ADDENDUM: Critical Value/emergent results were called by telephone at the time of interpretation on 07/02/2018 at 2:00 pm to Dr. Dana Allan , who verbally acknowledged these results. Electronically Signed   By: Elon Alas M.D.   On: 07/02/2018 14:02   Result Date: 07/02/2018 CLINICAL DATA:  Code stroke.  Altered mental status. EXAM: CT HEAD WITHOUT CONTRAST TECHNIQUE: Contiguous axial images were obtained from the base of the skull through the vertex without intravenous contrast. COMPARISON:  CT HEAD June 29, 2018 and MRA head June 21, 2018. FINDINGS: Moderately motion degraded examination, not improved on repeat imaging. BRAIN: No intraparenchymal hemorrhage, mass effect nor midline shift. The ventricles and sulci are normal for age. Patchy supratentorial white matter hypodensities less than expected for patient's age, though non-specific are most compatible with chronic small vessel ischemic disease. No acute large vascular territory infarcts. No abnormal extra-axial fluid collections. Basal cisterns are patent. VASCULAR: Moderate calcific atherosclerosis of the carotid siphons. SKULL: No skull fracture. No significant scalp soft tissue swelling. SINUSES/ORBITS: Trace paranasal sinus mucosal thickening, small mucosal retention cysts. Mastoid air cells are well aerated.The included ocular globes and orbital contents are non-suspicious. OTHER: None. ASPECTS [facility] Stroke Program Early CT Score) - Ganglionic level infarction (caudate, lentiform nuclei, internal capsule, insula, M1-M3 cortex): 7 - Supraganglionic infarction (M4-M6 cortex): 3 Total score (0-10 with 10 being normal): 10 IMPRESSION: 1. Negative motion degraded noncontrast MRI head for age. 2. ASPECTS is 10. Electronically Signed: By: Elon Alas M.D. On:  07/02/2018 13:50   Ct Head Code Stroke Wo Contrast  Result Date: 06/21/2018 CLINICAL DATA:  Code stroke. Altered level of consciousness. Left facial droop. Slurred speech. EXAM: CT HEAD WITHOUT CONTRAST TECHNIQUE: Contiguous axial images were obtained from the base of the skull through the vertex without intravenous contrast. COMPARISON:  CT head 06/05/2018 FINDINGS: Brain: Mild atrophy is unchanged. Negative for acute infarct, hemorrhage, mass. Mild chronic microvascular ischemia in the white matter. Vascular: Negative for hyperdense vessel. Atherosclerotic calcification at the skull base. Skull: Negative Sinuses/Orbits: Negative Other: None ASPECTS ([place] Stroke Program Early CT Score) - Ganglionic level infarction (caudate, lentiform nuclei, internal capsule, insula, M1-M3 cortex): 7 - Supraganglionic infarction (M4-M6 cortex): 3 Total score (0-10 with 10 being normal): 10 IMPRESSION: 1. No acute abnormality 2. ASPECTS is 10 3. These results were called by telephone  at the time of interpretation on 06/21/2018 at 1:44 pm to Dr. Rory Percy , who verbally acknowledged these results. Electronically Signed   By: Franchot Gallo M.D.   On: 06/21/2018 13:44    Microbiology: Recent Results (from the past 240 hour(s))  Culture, blood (routine x 2)     Status: None   Collection Time: 07/03/18  3:20 PM  Result Value Ref Range Status   Specimen Description   Final    BLOOD PICC LINE Performed at [facility], [place] [address], [place], [place] [address]    Special Requests   Final    BOTTLES DRAWN AEROBIC AND ANAEROBIC Blood Culture adequate volume   Culture   Final    NO GROWTH 5 DAYS Performed at [facility], [place] [address] [place], [place] [address]    Report Status 07/08/2018 FINAL  Final  Culture, blood (routine x 2)     Status: None   Collection Time: 07/03/18  3:25 PM  Result Value Ref Range Status   Specimen Description   Final    BLOOD PICC LINE Performed at [facility], [place] [address], [place], [place] [address]    Special Requests   Final    BOTTLES DRAWN AEROBIC AND ANAEROBIC Blood Culture adequate volume   Culture   Final    NO GROWTH 5 DAYS Performed at [facility], [place] [address], [place], [place] [address]    Report Status 07/08/2018 FINAL  Final     Labs: Basic Metabolic Panel: Recent Labs  Lab 07/03/18 0531 07/04/18 0543 07/05/18 0500 07/07/18 0307 07/08/18 0443  NA 141 146* 145 142 143  K 4.0 3.2* 3.6 4.4 4.4  CL 119* 124* 125* 122* 125*  CO2 16* 14* 16* 12* 13*  GLUCOSE 103* 83 80 116* 105*  BUN 10 8 8 10 13   CREATININE 1.54* 1.50* 1.53* 2.71* 3.37*  CALCIUM 7.7* 8.1* 8.1* 8.1* 8.2*  MG 1.9  --   --   --   --   PHOS 2.5 2.3* 2.3* 3.3  --    Liver Function Tests: Recent Labs  Lab 07/03/18 0531 07/04/18 0543 07/05/18 0500 07/07/18 0307 07/08/18 0443  AST  --   --   --   --  186*  ALT  --   --   --   --  24  ALKPHOS  --   --   --   --  426*  BILITOT  --   --   --   --  2.3*  PROT  --   --   --   --  4.9*  ALBUMIN 2.4* 3.2* 2.8* 2.2* 1.9*   No results for input(s): LIPASE, AMYLASE in the last 168 hours. Recent Labs  Lab 07/02/18 1134  AMMONIA 42*   CBC: Recent Labs  Lab 07/04/18 0543 07/04/18 1605 07/05/18 0500 07/06/18 0330 07/07/18 0307 07/08/18 0443  WBC 13.3*  --  15.0* 18.9* 17.7* 16.2*  NEUTROABS 10.6*  --  11.5* 15.4* 14.0* 13.0*  HGB 7.2* 9.4* 9.7* 9.8* 9.2* 8.9*  HCT 21.4* 27.5* 28.3* 28.9* 27.0* 25.8*  MCV 91.1  --  90.7 91.7 90.9 90.5  PLT 243  --  234 217 193 167   Cardiac Enzymes: No results for input(s): CKTOTAL, CKMB, CKMBINDEX, TROPONINI in the last 168 hours. BNP: BNP (last 3 results) No results for input(s): BNP in the last 8760 hours.  ProBNP (last 3 results) No results for input(s):  PROBNP in the last 8760 hours.  CBG: Recent Labs  Lab 07/08/18 1555 07/08/18 2008 07/09/18 0006 07/09/18 0437 07/09/18 0735  GLUCAP 77 82 83 77 82        Signed:  Nita Sells MD   [facility] 07/09/2018, 9:23 AM

## 2018-07-09 NOTE — Progress Notes (Signed)
[facility] room available for patient today. Met with patient's niece to complete paper work for transfer today. RN and CSW aware. Discharge summary has been routed.   RN please call report to [phone].  Thank you,  Erling Conte, LCSW [phone]

## 2018-07-09 NOTE — Progress Notes (Signed)
Patient discharged to [facility] via [place], report called to [place]. Patient discharged with Flexi seal ans IV per MD.

## 2018-07-11 ENCOUNTER — Telehealth: Payer: Self-pay | Admitting: Nurse Practitioner

## 2018-07-11 NOTE — Telephone Encounter (Signed)
LVM for patient to schedule hospital f/u with [facility] for hosp discharge 06/27/18.

## 2018-07-11 DEATH — deceased

## 2018-08-16 ENCOUNTER — Ambulatory Visit: Payer: PPO

## 2018-08-16 ENCOUNTER — Ambulatory Visit: Payer: PPO | Admitting: Nurse Practitioner

## 2019-10-02 IMAGING — CT CT ABDOMEN W/ CM
2 of 9 series · 9 of 46 positions shown, 15 images · IV contrast (ISOVUE 300)
Comparison: CT scan 11/03/2017

CLINICAL DATA: New diagnosis pancreatic cancer.

EXAM:
CT ABDOMEN WITH CONTRAST
TECHNIQUE: Multidetector CT imaging of the abdomen was performed using the
standard protocol following bolus administration of intravenous
contrast.
CONTRAST:  100 cc [id number]

[Series 4: axial venous · axial · portal-venous · 0.75mm/px · z∈[-205,-13]mm · 6 of 90 slices shown, 11 images]
[im 13/90  soft-tissue]
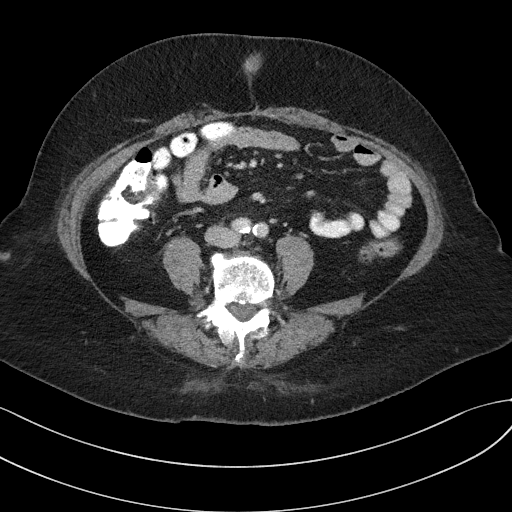
[im 13/90  bone]
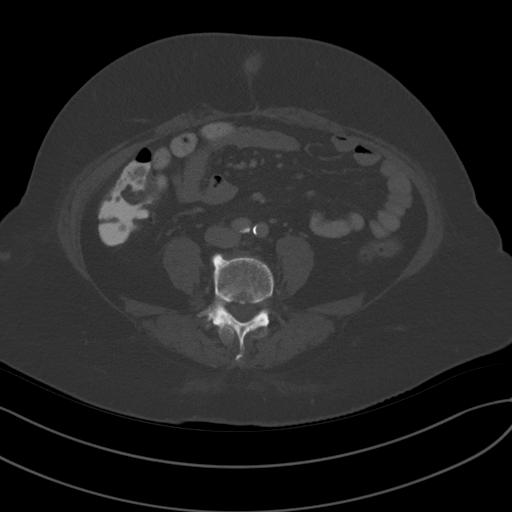
[im 26/90  soft-tissue]
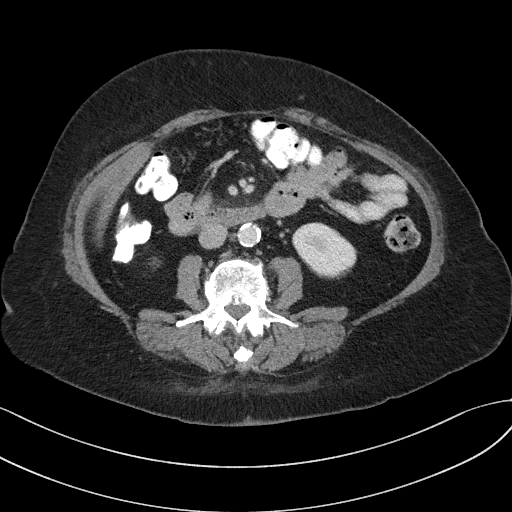
[im 39/90  soft-tissue]
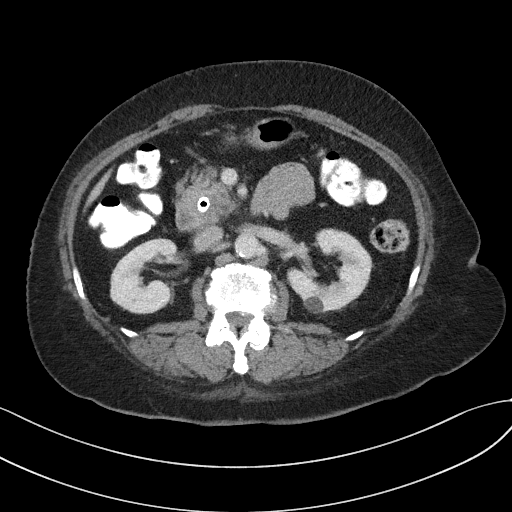
[im 39/90  lung]
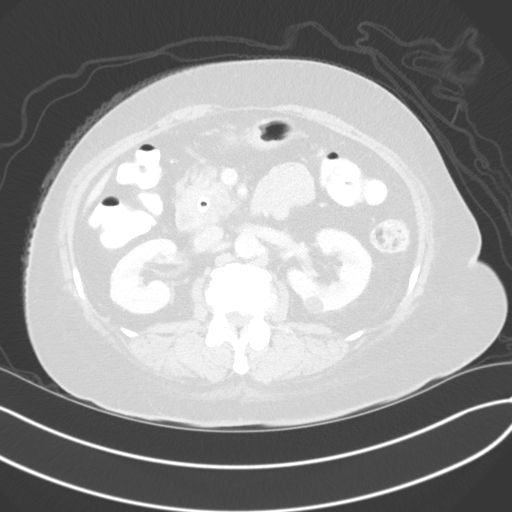
[im 51/90  soft-tissue]
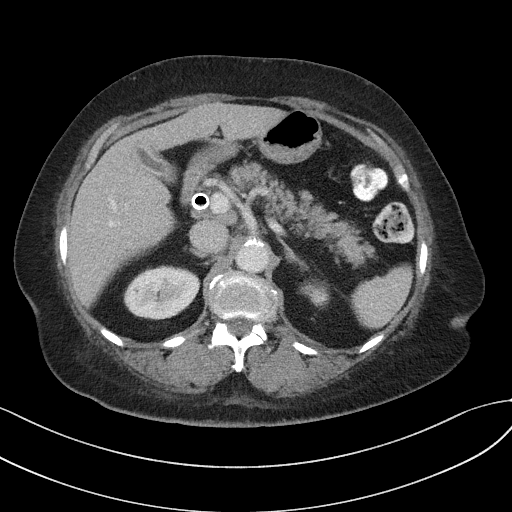
[im 51/90  lung]
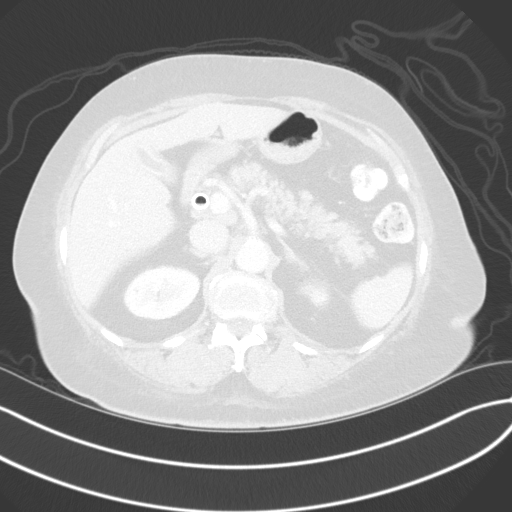
[im 64/90  soft-tissue]
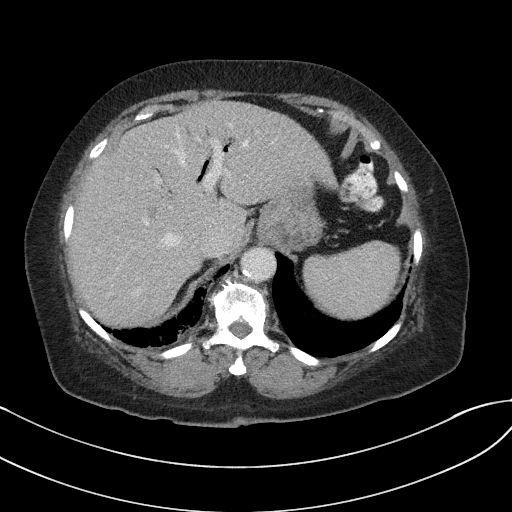
[im 64/90  lung]
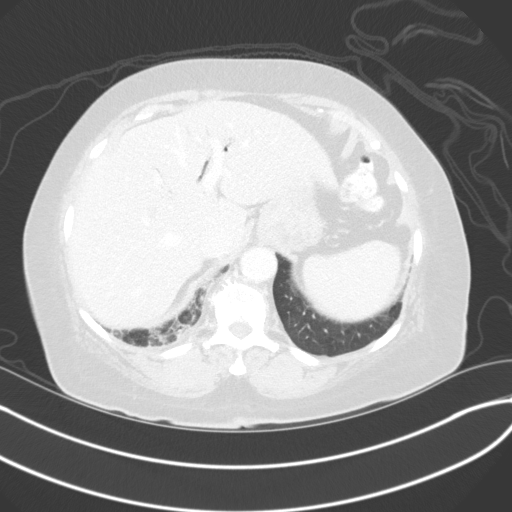
[im 77/90  soft-tissue]
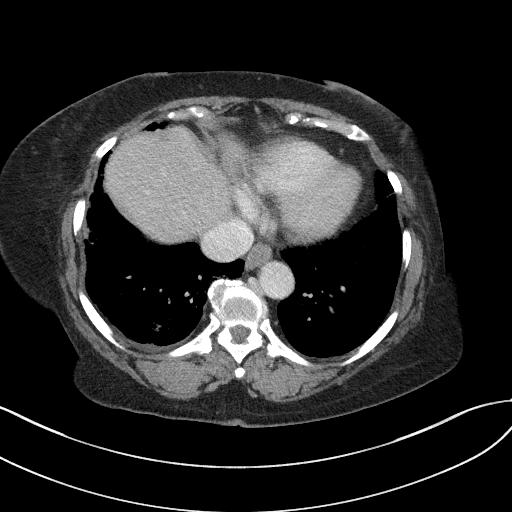
[im 77/90  lung]
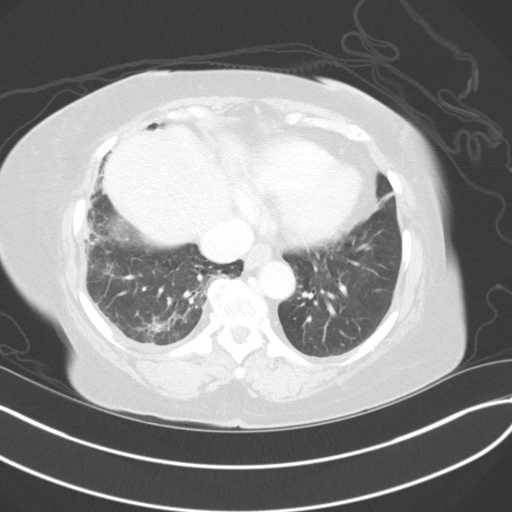

[Series 7: coronal arterial · coronal · arterial · 0.53mm/px · 3 of 101 slices shown, 4 images]
[im 26/101  soft-tissue]
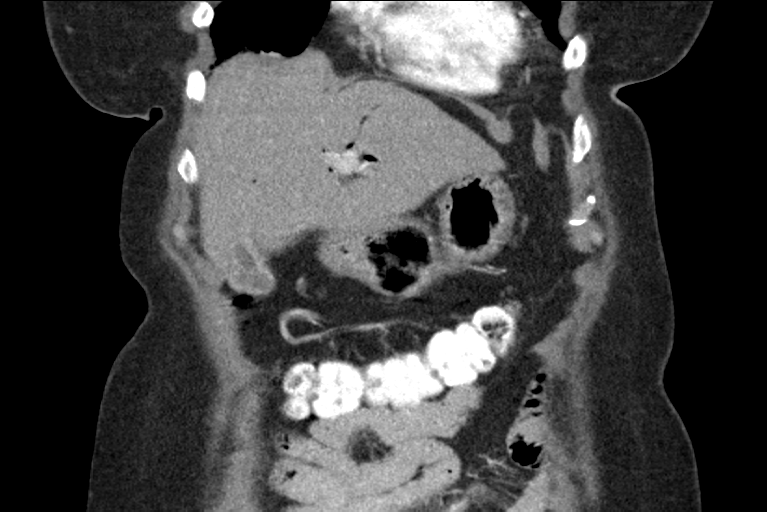
[im 51/101  soft-tissue]
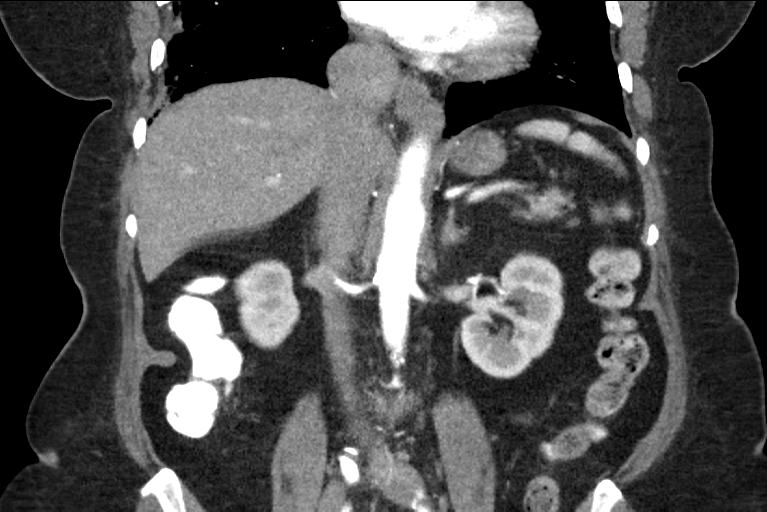
[im 51/101  bone]
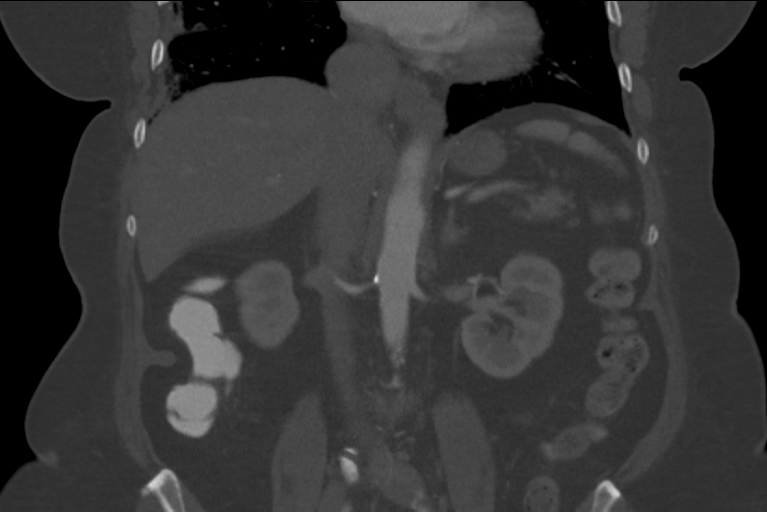
[im 76/101  soft-tissue]
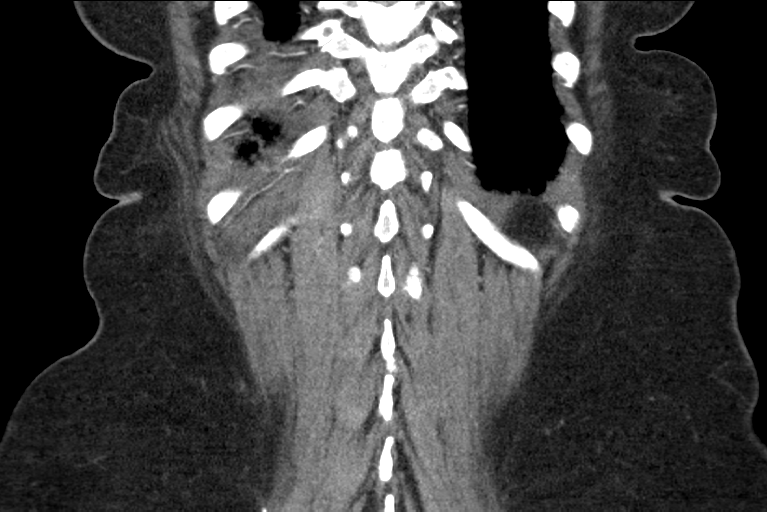

[9 of 46 positions shown; findings below may reference images not displayed]

FINDINGS: Lower chest: Mild cardiac enlargement. Patchy basilar atelectasis on
the right side. No worrisome pulmonary lesions.

Hepatobiliary: No focal hepatic lesions to suggest metastatic
disease. Pneumobilia due to common bile duct stent. The gallbladder
is contracted and contains a small amount of gas.

Pancreas: 21 x 20 mm pancreatic head/uncinate process mass adjacent
to the common bile duct stent. The body and tail the pancreas appear
normal. No main pancreatic ductal dilatation. No obvious
invasion/involvement of the major surround vascular structures such
as the portal vein, SMA or SMV. No involvement of the hepatic
duodenal artery is identified.

Spleen: Normal size.  No focal lesions.

Adrenals/Urinary Tract: Stable right adrenal gland adenoma. The left
adrenal gland is normal. Both kidneys are unremarkable. Simple
appearing cysts are noted.

Stomach/Bowel: The stomach, duodenum, visualized small bowel and
visualize colon are unremarkable. No acute inflammatory changes,
mass lesions or obstructive findings. The terminal ileum is normal.

Vascular/Lymphatic: Advanced vascular calcifications involving the
aorta and branch vessel ostia. No aneurysm or dissection. The major
venous structures are patent.

Borderline enlarged periportal lymph nodes. 8.5 mm celiac axis lymph
node on image number 38. 10 mm celiac axis lymph node on image
number 39. 9 mm hepatoduodenal ligament lymph node on image number
49. No retroperitoneal lymphadenopathy.

Other: No ascites or abdominal wall hernia.

Musculoskeletal: No significant bony findings.
IMPRESSION: 1. 21 x 20 mm pancreatic head/uncinate process lesion without
obvious involvement/invasion of the major surrounding vascular
structures.
2. Common bile duct stent in place with associated pneumobilia but
decompression of the biliary tree.
3. Borderline enlarged celiac axis and periportal lymph nodes.
4. No findings to suggest hepatic metastatic disease.
5. Advanced aortic and branch vessel calcifications but no aneurysm
or dissection.
6. Patchy right basilar atelectasis.

## 2020-04-05 IMAGING — CR DG CHEST 2V
2 series · 2 of 2 positions shown · non-contrast
Comparison: Prior chest x-ray 04/11/2018

CLINICAL DATA: 82-year-old female with generalized weakness

EXAM:
CHEST - 2 VIEW

[w chest lat]
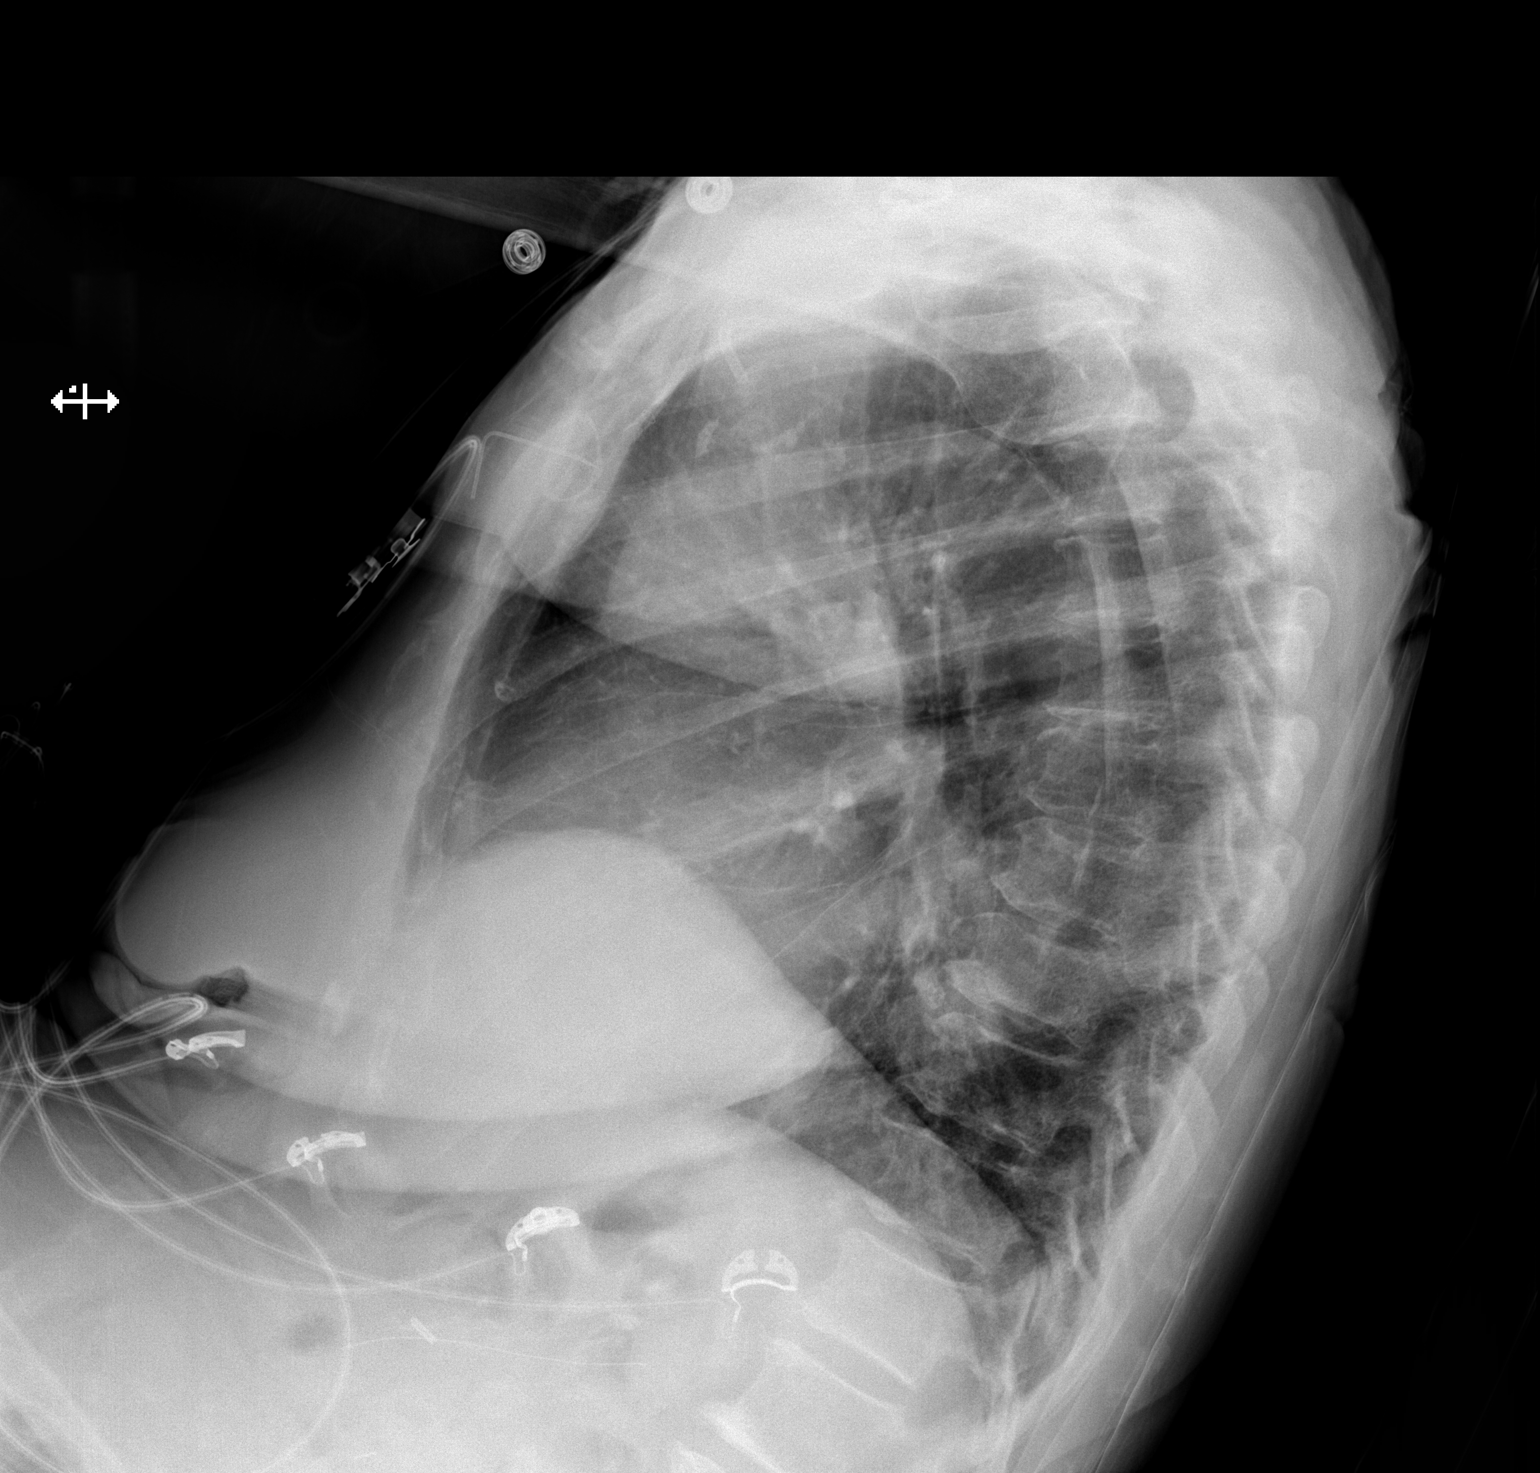

[x chest ap]
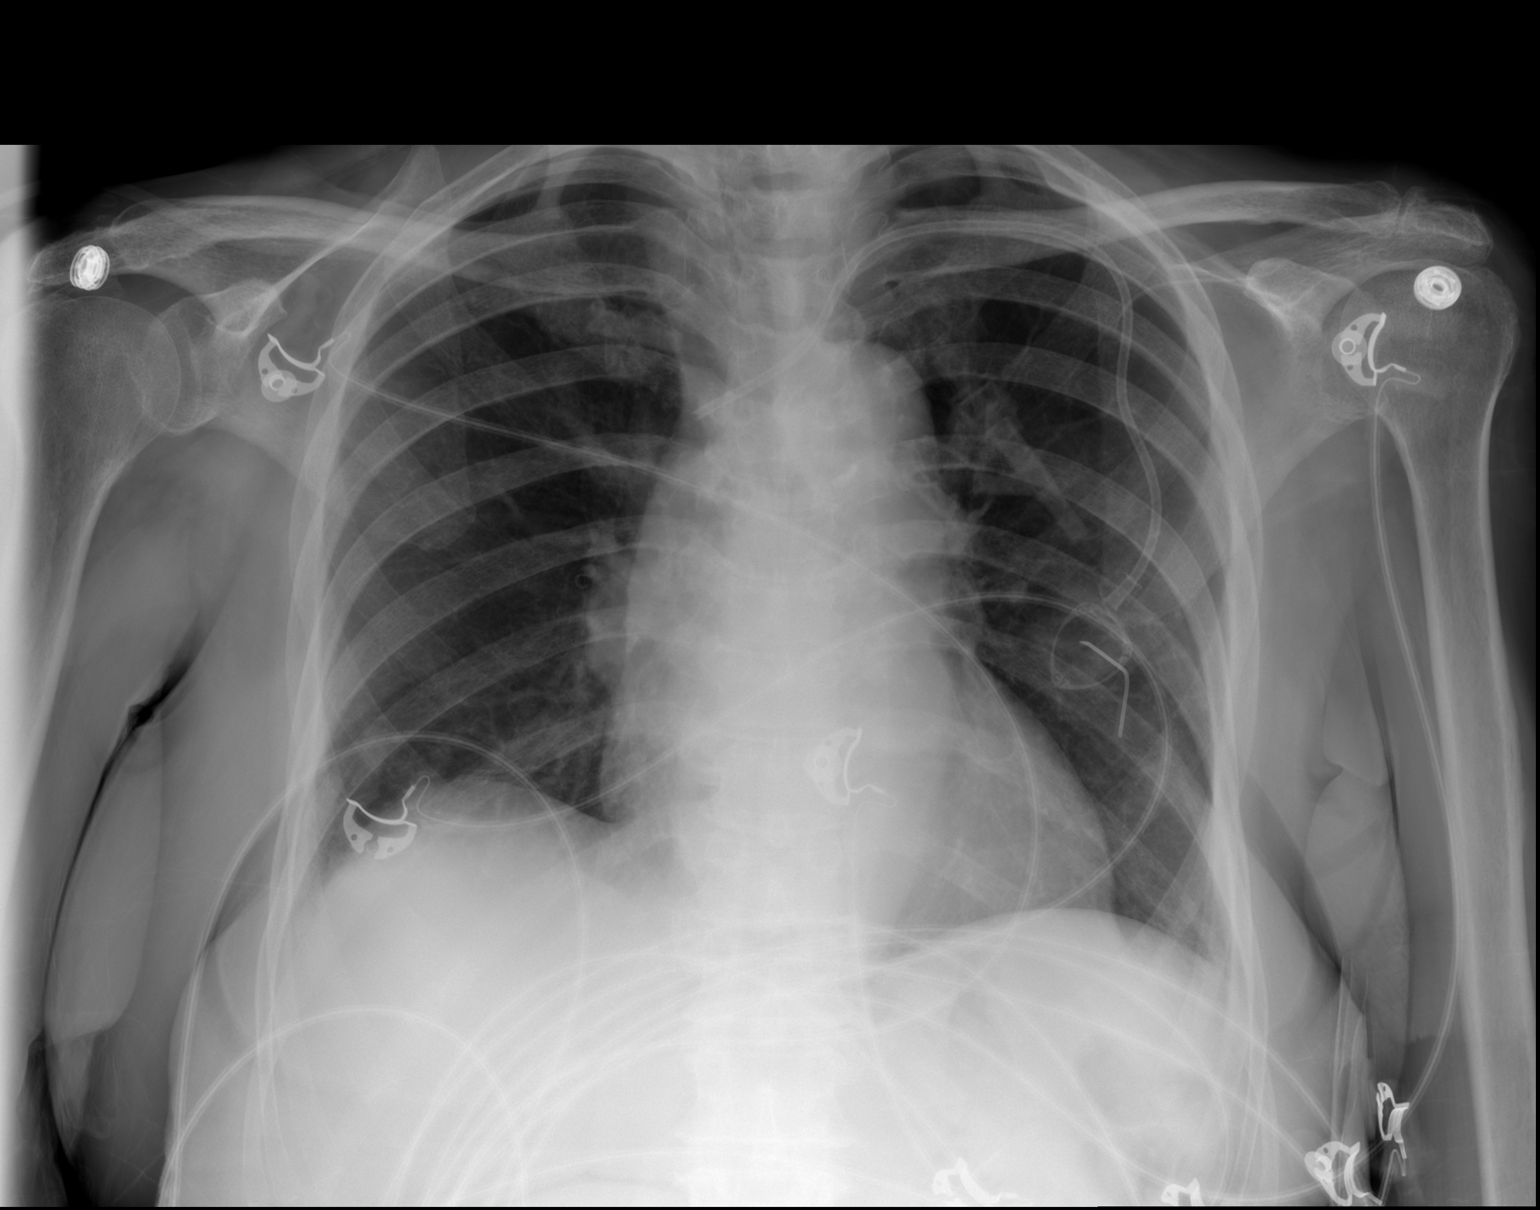

[2 of 2 positions shown; findings below may reference images not displayed]

FINDINGS: Stable cardiac and mediastinal contours. A left subclavian approach
single-lumen power injectable portacatheter is present. The tip of
the catheter has retracted some and now overlies the left innominate
vein. No evidence of focal airspace consolidation, infiltrate,
pleural effusion or pneumothorax. Inspiratory volumes are slightly
low. Chronic bronchitic changes appear similar compared to prior. No
acute osseous abnormality.
IMPRESSION: 1. No acute cardiopulmonary process.
2. The tip of the left IJ approach single-lumen power injectable
port catheter has retracted slightly and now lies within the left
innominate vein.
